# Patient Record
Sex: Female | Born: 1935
Health system: Southern US, Community
[De-identification: ages and names within clinical notes are randomized; demographics above are authoritative.]

## PROBLEM LIST (undated history)

## (undated) DIAGNOSIS — I509 Heart failure, unspecified: Secondary | ICD-10-CM

## (undated) DIAGNOSIS — G8929 Other chronic pain: Secondary | ICD-10-CM

## (undated) DIAGNOSIS — I4891 Unspecified atrial fibrillation: Secondary | ICD-10-CM

## (undated) DIAGNOSIS — M419 Scoliosis, unspecified: Secondary | ICD-10-CM

## (undated) DIAGNOSIS — I10 Essential (primary) hypertension: Secondary | ICD-10-CM

## (undated) HISTORY — PX: OTHER SURGICAL HISTORY: SHX169

## (undated) HISTORY — PX: TONSILLECTOMY: SUR1361

## (undated) HISTORY — PX: ABDOMINAL SURGERY: SHX537

---

## 2019-08-02 DIAGNOSIS — I10 Essential (primary) hypertension: Secondary | ICD-10-CM | POA: Diagnosis present

## 2019-08-02 DIAGNOSIS — E785 Hyperlipidemia, unspecified: Secondary | ICD-10-CM | POA: Insufficient documentation

## 2019-08-02 DIAGNOSIS — G909 Disorder of the autonomic nervous system, unspecified: Secondary | ICD-10-CM | POA: Insufficient documentation

## 2019-08-02 DIAGNOSIS — E559 Vitamin D deficiency, unspecified: Secondary | ICD-10-CM | POA: Insufficient documentation

## 2019-11-12 DIAGNOSIS — C4491 Basal cell carcinoma of skin, unspecified: Secondary | ICD-10-CM

## 2019-11-12 HISTORY — DX: Basal cell carcinoma of skin, unspecified: C44.91

## 2019-12-03 DIAGNOSIS — M4120 Other idiopathic scoliosis, site unspecified: Secondary | ICD-10-CM | POA: Insufficient documentation

## 2019-12-03 DIAGNOSIS — M419 Scoliosis, unspecified: Secondary | ICD-10-CM | POA: Insufficient documentation

## 2020-01-05 DIAGNOSIS — M25559 Pain in unspecified hip: Secondary | ICD-10-CM | POA: Insufficient documentation

## 2020-01-11 ENCOUNTER — Encounter: Payer: Self-pay | Admitting: Pain Medicine

## 2020-01-11 NOTE — Progress Notes (Signed)
Patient: [name] [name]  Service Category: E/M  Provider: Gaspar Cola, MD  DOB: [birth year]  DOS: 01/12/2020  Location: Office  MRN: [id number]  Setting: Ambulatory outpatient  Referring Provider: Perrin Maltese, MD  Type: New Patient  Specialty: Interventional Pain Management  PCP: Perrin Maltese, MD  Location: Remote location  Delivery: TeleHealth     Virtual Encounter - Pain Management PROVIDER NOTE: Information contained herein reflects review and annotations entered in association with encounter. Interpretation of such information and data should be left to medically-trained personnel. Information provided to patient can be located elsewhere in the medical record under "Patient Instructions". Document created using STT-dictation technology, any transcriptional errors that may result from process are unintentional.    Contact & Pharmacy Preferred: [phone] Home: [phone] (home) Mobile: There is no such number on file (mobile). E-mail: No e-mail address on record  [id number] [id number]: [phone]: [phone]  Pre-screening note:  Our staff contacted [name] and offered her an "in person", "face-to-face" appointment versus a telephone encounter. She indicated preferring the telephone encounter, at this time.  Primary Reason(s) for Visit: Tele-Encounter for initial evaluation of one or more chronic problems (new to examiner) potentially causing chronic pain, and posing a threat to normal musculoskeletal function. (Level of risk: High) CC: Back Pain, Hand Pain, Leg Pain, and Hip Pain  I contacted [name] 01/12/2020 via telephone.      I clearly identified myself as FGaspar Cola MD. I verified that I was speaking with the correct person using two identifiers (Name: [name] [name] and date of birth: [birth year].  Advanced Informed Consent I sought verbal advanced consent from  BJohny Shockfor virtual visit interactions. I informed [name] of possible security and privacy concerns, risks, and limitations associated with providing "not-in-person" medical evaluation and management services. I also informed [name] of the availability of "in-person" appointments. Finally, I informed her that there would be a charge for the virtual visit and that she could be  personally, fully or partially, financially responsible for it. Ms. LShermanexpressed understanding and agreed to proceed.   HPI  Ms. LWaldroupis a 85y.o. year old, female patient, contacted today for an initial evaluation of her chronic pain. She has Chronic low back pain (1ry area of Pain) (Bilateral) (R>L) w/ sciatica (Bilateral); Chronic lower extremity pain (2ry area of Pain) (Bilateral) (R>L); Scoliosis (and kyphoscoliosis), idiopathic; DDD (degenerative disc disease), lumbosacral; History of total hip replacement (Bilateral); Walker as ambulation aid; Chronic peripheral neuropathic pain (Bilateral); Chronic pain syndrome; Pharmacologic therapy; Disorder of skeletal system; and Problems influencing health status on their problem list.   Onset and Duration: Gradual Cause of pain: Surgery Severity: No change since onset, NAS-11 at its worse: 10/10, NAS-11 at its best: 10/10, NAS-11 now: 10/10 and NAS-11 on the average: 10/10 Timing: Not influenced by the time of the day Aggravating Factors: Bending, Climbing, Kneeling, Lifiting, Motion, Prolonged sitting, Prolonged standing, Squatting, Stooping , Twisting, Walking, Walking uphill and Walking downhill Alleviating Factors: Acupuncture Associated Problems: Day-time cramps, Night-time cramps, Numbness, Temperature changes, Tingling, Pain that wakes patient up and Pain that does not allow patient to sleep Quality of Pain: Aching, Agonizing, Burning, Constant, Cramping, Cruel, Deep, Disabling, Distressing, Dreadful, Exhausting, Getting longer, Horrible, Nagging,  Pressure-like, Punishing, Sharp, Shooting, Stabbing, Throbbing, Tingling, Tiring, Toothache-like and Uncomfortable Previous Examinations or Tests: MRI scan, Nerve block, X-rays, Neurological evaluation, Orthopedic  evaluation and Chiropractic evaluation Previous Treatments: Epidural steroid injections and Narcotic medications  According to the patient her primary area of pain is that of the lower back, bilaterally, with the right being worse than the left.  She denies any prior back surgeries, or any recent x-rays, MRIs or CT of the lumbar spine.  She denies having had physical therapy for the lower back and she indicates having had a series of 4 lumbar epidural steroid injections done in 2012, in [place], which did seem to help.  The patient's secondary to pain is that of the lower extremities, bilaterally, with the right being worse than the left.  In the case of the right lower extremity the pain is described to be from the knee down to the ankle with no pain, numbness, or weakness above the knee or below the ankle.  This same pattern can be seen also in her left lower extremity pain.  She admits that when she gets the leg pain, she also will experience numbness in the same area.  She denies any surgeries in that area, x-rays or any other diagnostic imaging of the area, physical therapy, or injections into the lower extremities.  The patient also denies any nerve conduction tests.  The patient does have a history positive for bilateral hip replacements with the left one done on 2019 and the right 1 on 2012.  She denies any pain over the hip areas.  She also admits to having scoliosis and a significant lumbar curvature where she indicates being unable to straighten up and also unable to bend down or pick anything up from the floor if it falls down.  The patient indicates using a walker all the time to ambulate.  She also indicates that she has been using this walker for the past 3 years.  The patient  appears to have recently moved to the [place] area since there are no x-rays or lab work available within [facility].  She also does not have any access to "care everywhere" suggesting that she has also not seen anyone within those areas.  A review of the patient's PMP reveals that she has been taking oxycodone/APAP 10/25 since early 2019.  The records also revealed that she has been taking this medication on a regular monthly basis.  Since she is 85 years old, it is very likely that this patient also has a history of taking this type of medication well before 2019 and probably having significant tolerance to the opioids.  After completing my evaluation, I then proceeded to provide the patient with some information with regards to what is it that we do and what is it that we specialize in.  It was at this time that the patient informed us that she is not interested in having any of the interventional therapies that we specialize in.  She also indicated that she was under the impression that she was sent over to [place] so that we would take over her medications and managed them from now on.  I have informed the patient that I do not take any patients for medication management only.  In view of this revelation, I asked the patient if she was interested in me finishing her evaluation by ordering her labs and x-rays and then going over them to tell her what we could offer her in terms of interventional therapies, but she indicated that I would be wasting my time.  Because of this we have decided not to order  any further work-up and we will not be scheduling the patient for a follow-up visit but we have informed the patient that we will keep the doors open, in case she changes her mind and wants to come in for interventional therapies.  Recommendations: At this point, my recommendations would be to order a baseline CRP and sed rate to determine if this patient is having either an acute or an ongoing  inflammatory condition.  Should sed rate or C-reactive protein be elevated, you should consider adding a long-term nonsteroidal anti-inflammatory drug to her regimen.  In addition, I would recommend ordering a complete metabolic panel, magnesium levels, vitamin B12, and vitamin D levels, to evaluate things that could influence her pain perception, such as vitamin D deficiencies or vitamin B12 deficiencies.  Furthermore, I would recommend that she have a bilateral lower extremity nerve conduction test to determine if she in fact has a peripheral neuropathy versus a bilateral lumbosacral radiculopathy/radiculitis.  Because of the patient's age and symptoms, I would also suggest ordering x-rays of the thoracic and lumbar spine with flexion and extension views to determine if the patient has any vertebral body fractures and/or unstable spondylolisthesis.  The patient's history and age would support the possibility that she may be having thoracolumbar facet arthropathy which could be causing some of the patient's low back pain symptoms.  Furthermore, at age 69 and taking into consideration the fact that she admits walking around with significant scoliosis and curvature of her thoracolumbar spine, it is very likely that she has foraminal as well as central spinal stenosis that may also account for the patient's low back and lower extremity pains.  Because of this, she could also benefit from a lumbar MRI.  Since the patient is not interested in any interventional therapies, we have not ordered any of these tests since we are probably not can a follow-up with the patient anyway since she has no intentions of considering the therapies that we have to offer.  Because of this, I have limited my intervention in this case to the initial evaluation and my recommendations.  Regarding the patient's pharmacal therapy with opioids, I would recommend ordering the above x-rays, MRIs, and blood work to determine the extent of the  patient's chronic pain condition.  Should all of the above proved to be within normal limits or simply with minor degenerative changes, consideration should be given to doing a  "Drug Holiday" to eliminate the patient's tolerance to the opioids so that when they are restarted she can get better relief with lower doses.  Historic Controlled Substance Pharmacotherapy Review  Current opioid analgesics: Oxycodone/APAP 10/325, 1 tab PO q 6 hrs (40 mg/day of oxycodone) Highest recorded MME/day: 90 mg/day MME/day: 60 mg/day   Historical Monitoring: The patient  reports previous drug use. List of all UDS Test(s): No results found. List of other Serum/Urine Drug Screening Test(s):  No results found. Historical Background Evaluation: [place] PMP: PDMP reviewed during this encounter. Two (2) year initial data search conducted.             PMP NARX Score Report:  Narcotic: 401 Sedative: 180 Stimulant: 000 Risk Assessment Profile: PMP NARX Overdose Risk Score: 300  Pharmacologic Plan: As per protocol, I have not taken over any controlled substance management, pending the results of ordered tests and/or consults.            Initial impression: Pending review of available data and ordered tests.  Meds   Current Outpatient Medications:  .  aspirin 81 MG chewable tablet, Chew by mouth daily., Disp: , Rfl:  .  Cholecalciferol (VITAMIN D-3) 125 MCG (5000 UT) TABS, Take 1 tablet by mouth daily., Disp: , Rfl:  .  gabapentin (NEURONTIN) 400 MG capsule, Take 400 mg by mouth 4 (four) times daily., Disp: , Rfl:  .  milk thistle 175 MG tablet, Take 175 mg by mouth daily., Disp: , Rfl:  .  Multiple Vitamins-Minerals (ICAPS AREDS 2 PO), Take 1 tablet by mouth daily., Disp: , Rfl:  .  NIFEdipine (ADALAT CC) 60 MG 24 hr tablet, Take 60 mg by mouth daily., Disp: , Rfl:  .  oxyCODONE (OXY IR/ROXICODONE) 5 MG immediate release tablet, Take 5 mg by mouth 4 (four) times daily., Disp: , Rfl:  .  Turmeric (QC TUMERIC  COMPLEX PO), Take 500 mg by mouth daily., Disp: , Rfl:   ROS  Cardiovascular: Daily Aspirin intake and High blood pressure Pulmonary or Respiratory: No reported pulmonary signs or symptoms such as wheezing and difficulty taking a deep full breath (Asthma), difficulty blowing air out (Emphysema), coughing up mucus (Bronchitis), persistent dry cough, or temporary stoppage of breathing during sleep Neurological: Abnormal skin sensations (Peripheral Neuropathy), TIA at 69 y0 Psychological-Psychiatric: No reported psychological or psychiatric signs or symptoms such as difficulty sleeping, anxiety, depression, delusions or hallucinations (schizophrenial), mood swings (bipolar disorders) or suicidal ideations or attempts Gastrointestinal: No reported gastrointestinal signs or symptoms such as vomiting or evacuating blood, reflux, heartburn, alternating episodes of diarrhea and constipation, inflamed or scarred liver, or pancreas or irrregular and/or infrequent bowel movements Genitourinary: No reported renal or genitourinary signs or symptoms such as difficulty voiding or producing urine, peeing blood, non-functioning kidney, kidney stones, difficulty emptying the bladder, difficulty controlling the flow of urine, or chronic kidney disease Hematological: Brusing easily Endocrine: No reported endocrine signs or symptoms such as high or low blood sugar, rapid heart rate due to high thyroid levels, obesity or weight gain due to slow thyroid or thyroid disease Rheumatologic: No reported rheumatological signs and symptoms such as fatigue, joint pain, tenderness, swelling, redness, heat, stiffness, decreased range of motion, with or without associated rash Musculoskeletal: Negative for myasthenia gravis, muscular dystrophy, multiple sclerosis or malignant hyperthermia Work History: Disabled  Allergies  Ms. Jarchow is allergic to sulfa antibiotics.  Laboratory Chemistry Profile   Renal No results found for:  BUN, CREATININE, LABCREA, BCR, GFR, GFRAA, GFRNONAA, SPECGRAV, PHUR, PROTEINUR  Electrolytes No results found for: NA, K, CL, CALCIUM, MG, PHOS  Hepatic No results found for: AST, ALT, ALBUMIN, ALKPHOS, AMYLASE, LIPASE, AMMONIA  ID No results found for: LYMEIGGIGMAB, HIV, SARSCOV2NAA, STAPHAUREUS, MRSAPCR, HCVAB, PREGTESTUR, RMSFIGG, QFVRPH1IGG, QFVRPH2IGG, LYMEIGGIGMAB  Bone No results found for: VD25OH, EX517GY1VCB, SW9675FF6, BW4665LD3, 25OHVITD1, 25OHVITD2, 25OHVITD3, TESTOFREE, TESTOSTERONE  Endocrine No results found for: GLUCOSE, GLUCOSEU, HGBA1C, TSH, FREET4, TESTOFREE, TESTOSTERONE, SHBG, ESTRADIOL, ESTRADIOLPCT, ESTRADIOLFRE, LABPREG, ACTH, CRTSLPL, UCORFRPERLTR, UCORFRPERDAY, CORTISOLBASE, LABPREG  Neuropathy No results found for: VITAMINB12, FOLATE, HGBA1C, HIV  CNS No results found for: COLORCSF, APPEARCSF, RBCCOUNTCSF, WBCCSF, POLYSCSF, LYMPHSCSF, EOSCSF, PROTEINCSF, GLUCCSF, JCVIRUS, CSFOLI, IGGCSF, LABACHR, ACETBL, LABACHR, ACETBL  Inflammation (CRP: Acute  ESR: Chronic) No results found for: CRP, ESRSEDRATE, LATICACIDVEN  Rheumatology No results found for: RF, ANA, LABURIC, URICUR, LYMEIGGIGMAB, LYMEABIGMQN, HLAB27  Coagulation No results found for: INR, LABPROT, APTT, PLT, DDIMER, LABHEMA, VITAMINK1, AT3  Cardiovascular No results found for: BNP, CKTOTAL, CKMB, TROPONINI, HGB, HCT, LABVMA, EPIRU, EPINEPH24HUR, NOREPRU, NOREPI24HUR, DOPARU, DOPAM24HRUR  Screening No results found for: SARSCOV2NAA, COVIDSOURCE, STAPHAUREUS, MRSAPCR, HCVAB, HIV,  PREGTESTUR  Cancer No results found for: CEA, CA125, LABCA2  Allergens No results found for: ALMOND, APPLE, ASPARAGUS, AVOCADO, BANANA, BARLEY, BASIL, BAYLEAF, GREENBEAN, LIMABEAN, WHITEBEAN, BEEFIGE, REDBEET, BLUEBERRY, BROCCOLI, CABBAGE, MELON, CARROT, CASEIN, CASHEWNUT, CAULIFLOWER, CELERY    Note: No results found under the [employer] electronic medical record  Imaging Review   Complexity Note: No results found under the  [employer] electronic medical record.                        Circle  Drug: Ms. Jokerst  reports previous drug use. Alcohol:  reports previous alcohol use. Tobacco:  reports that she has been smoking cigarettes. She has been smoking about 5.00 packs per day. She has never used smokeless tobacco. Medical:  has no past medical history on file. Family: family history is not on file.  Active Ambulatory Problems    Diagnosis Date Noted  . Chronic low back pain (1ry area of Pain) (Bilateral) (R>L) w/ sciatica (Bilateral) 01/12/2020  . Chronic lower extremity pain (2ry area of Pain) (Bilateral) (R>L) 01/12/2020  . Scoliosis (and kyphoscoliosis), idiopathic 01/12/2020  . DDD (degenerative disc disease), lumbosacral 01/12/2020  . History of total hip replacement (Bilateral) 01/12/2020  . Walker as ambulation aid 01/12/2020  . Chronic peripheral neuropathic pain (Bilateral) 01/12/2020  . Chronic pain syndrome 01/12/2020  . Pharmacologic therapy 01/12/2020  . Disorder of skeletal system 01/12/2020  . Problems influencing health status 01/12/2020   Resolved Ambulatory Problems    Diagnosis Date Noted  . No Resolved Ambulatory Problems   No Additional Past Medical History   Assessment  Primary Diagnosis & Pertinent Problem List: Diagnoses of Chronic low back pain (1ry area of Pain) (Bilateral) (R>L) w/ sciatica (Bilateral), Chronic lower extremity pain (2ry area of Pain) (Bilateral) (R>L), Scoliosis (and kyphoscoliosis), idiopathic, DDD (degenerative disc disease), lumbosacral, History of total hip replacement (Bilateral), Walker as ambulation aid, Chronic peripheral neuropathic pain (Bilateral), Chronic pain syndrome, Pharmacologic therapy, Disorder of skeletal system, and Problems influencing health status were pertinent to this visit.  Visit Diagnosis (New problems to examiner): 1. Chronic low back pain (1ry area of Pain) (Bilateral) (R>L) w/ sciatica (Bilateral)   2. Chronic lower extremity  pain (2ry area of Pain) (Bilateral) (R>L)   3. Scoliosis (and kyphoscoliosis), idiopathic   4. DDD (degenerative disc disease), lumbosacral   5. History of total hip replacement (Bilateral)   6. Walker as ambulation aid   7. Chronic peripheral neuropathic pain (Bilateral)   8. Chronic pain syndrome   9. Pharmacologic therapy   10. Disorder of skeletal system   11. Problems influencing health status    Plan of Care (Initial workup plan)  Note: Ms. Perlow was reminded that as per protocol, today's visit has been an evaluation only. We have not taken over the patient's controlled substance management.  Problem-specific plan: No problem-specific Assessment & Plan notes found for this encounter.  Lab Orders  No laboratory test(s) ordered today   Imaging Orders  No imaging studies ordered today   Referral Orders  No referral(s) requested today   Procedure Orders    No procedure(s) ordered today   Pharmacotherapy (current): Medications ordered:  No orders of the defined types were placed in this encounter.    Pharmacological management options:  Opioid Analgesics: The patient was informed that there is no guarantee that she would be a candidate for opioid analgesics. The decision will be made following CDC guidelines. This decision will be based on  the results of diagnostic studies, as well as Ms. Bann's risk profile.   Membrane stabilizer: To be determined at a later time  Muscle relaxant: To be determined at a later time  NSAID: To be determined at a later time  Other analgesic(s): To be determined at a later time   Interventional management options: Ms. Vitali was informed that there is no guarantee that she would be a candidate for interventional therapies. The decision will be based on the results of diagnostic studies, as well as Ms. Godina's risk profile.  Procedure(s) under consideration:  Today the patient has indicated that she is not interested in any of the  interventional therapies that we have to offer.  She also indicated that perhaps she was sent to the wrong place since she was under the impression that we were simply going to take over her medication management and continue prescribing medicines for her.  Since that is not what we do and we do not take patients for medication management only, I have informed the patient that we will remain available to her should she be interested in the interventional therapies that we have to offer.  She indicated being quite sure that she wanted to avoid that.   Provider-requested follow-up: No follow-ups on file.  Future Appointments  Date Time Provider [place]  01/14/2020 10:00 AM [facility]   Total duration of encounter: 35 minutes.  Primary Care Physician: Perrin Maltese, MD Note by: Gaspar Cola, MD Date: 01/12/2020; Time: 3:44 PM

## 2020-01-12 ENCOUNTER — Other Ambulatory Visit: Payer: Self-pay

## 2020-01-12 ENCOUNTER — Ambulatory Visit: Payer: Medicare Other | Attending: Pain Medicine | Admitting: Pain Medicine

## 2020-01-12 DIAGNOSIS — G894 Chronic pain syndrome: Secondary | ICD-10-CM | POA: Insufficient documentation

## 2020-01-12 DIAGNOSIS — M79605 Pain in left leg: Secondary | ICD-10-CM

## 2020-01-12 DIAGNOSIS — M792 Neuralgia and neuritis, unspecified: Secondary | ICD-10-CM

## 2020-01-12 DIAGNOSIS — M5442 Lumbago with sciatica, left side: Secondary | ICD-10-CM

## 2020-01-12 DIAGNOSIS — Z789 Other specified health status: Secondary | ICD-10-CM

## 2020-01-12 DIAGNOSIS — M79604 Pain in right leg: Secondary | ICD-10-CM | POA: Diagnosis not present

## 2020-01-12 DIAGNOSIS — Z79899 Other long term (current) drug therapy: Secondary | ICD-10-CM

## 2020-01-12 DIAGNOSIS — Z9989 Dependence on other enabling machines and devices: Secondary | ICD-10-CM

## 2020-01-12 DIAGNOSIS — M899 Disorder of bone, unspecified: Secondary | ICD-10-CM

## 2020-01-12 DIAGNOSIS — G8929 Other chronic pain: Secondary | ICD-10-CM

## 2020-01-12 DIAGNOSIS — M4120 Other idiopathic scoliosis, site unspecified: Secondary | ICD-10-CM | POA: Diagnosis not present

## 2020-01-12 DIAGNOSIS — M5441 Lumbago with sciatica, right side: Secondary | ICD-10-CM

## 2020-01-12 DIAGNOSIS — Z96643 Presence of artificial hip joint, bilateral: Secondary | ICD-10-CM | POA: Insufficient documentation

## 2020-01-12 DIAGNOSIS — M5137 Other intervertebral disc degeneration, lumbosacral region: Secondary | ICD-10-CM

## 2020-01-12 HISTORY — DX: Presence of artificial hip joint, bilateral: Z96.643

## 2020-01-12 HISTORY — DX: Other long term (current) drug therapy: Z79.899

## 2020-01-12 HISTORY — DX: Other specified health status: Z78.9

## 2020-01-14 ENCOUNTER — Other Ambulatory Visit: Payer: Self-pay

## 2020-01-14 ENCOUNTER — Ambulatory Visit: Payer: Medicare Other | Attending: Internal Medicine

## 2020-01-14 DIAGNOSIS — Z23 Encounter for immunization: Secondary | ICD-10-CM | POA: Insufficient documentation

## 2020-01-14 NOTE — Progress Notes (Signed)
   Covid-19 Vaccination Clinic  Name:  [name] [name]    MRN: [id number] DOB: 12-Jun-1935  01/14/2020  [name] was observed post Covid-19 immunization for 15 minutes without incident. She was provided with Vaccine Information Sheet and instruction to access the V-Safe system.   [name] was instructed to call 911 with any severe reactions post vaccine: Marland Kitchen Difficulty breathing  . Swelling of face and throat  . A fast heartbeat  . A bad rash all over body  . Dizziness and weakness   Immunizations Administered    Name Date Dose VIS Date Route   Pfizer COVID-19 Vaccine 01/14/2020  9:41 AM 0.3 mL 10/22/2019 Intramuscular   Manufacturer: [place]   Lot: [id number]   [place]: [id number]

## 2020-02-03 ENCOUNTER — Ambulatory Visit: Payer: Medicare Other | Attending: Internal Medicine

## 2020-02-03 DIAGNOSIS — Z23 Encounter for immunization: Secondary | ICD-10-CM

## 2020-05-29 ENCOUNTER — Ambulatory Visit: Payer: Medicare Other | Admitting: Dermatology

## 2020-05-29 ENCOUNTER — Other Ambulatory Visit: Payer: Self-pay

## 2020-05-29 DIAGNOSIS — L578 Other skin changes due to chronic exposure to nonionizing radiation: Secondary | ICD-10-CM

## 2020-05-29 DIAGNOSIS — D229 Melanocytic nevi, unspecified: Secondary | ICD-10-CM

## 2020-05-29 DIAGNOSIS — D485 Neoplasm of uncertain behavior of skin: Secondary | ICD-10-CM

## 2020-05-29 DIAGNOSIS — C44319 Basal cell carcinoma of skin of other parts of face: Secondary | ICD-10-CM | POA: Diagnosis not present

## 2020-05-29 NOTE — Progress Notes (Signed)
   New Patient Visit  Subjective  [name] [name] is a 85 y.o. female who presents for the following: lesion.  Patient here today for a lesion at the left face. Was removed 11/16/2017 in [place] by Dr. Hulda Humphrey with Moh's and had clear margins per patient. She has been in [place] for about 10 months and within a few weeks she noticed something coming back that started the size of a pinhead. Patient does not remember what type of cancer it was. It has really grown over the past 9 months.  The following portions of the chart were reviewed this encounter and updated as appropriate:      Review of Systems:  No other skin or systemic complaints except as noted in HPI or Assessment and Plan.  Objective  Well appearing patient in no apparent distress; mood and affect are within normal limits.  A focused examination was performed including face. Relevant physical exam findings are noted in the Assessment and Plan.  Objective  Left Temple: 3.0 x 2.5cm pink pearly plaque with crusting with adjacent scar      Assessment & Plan  Neoplasm of uncertain behavior of skin Left Temple  Skin / nail biopsy Type of biopsy: tangential   Informed consent: discussed and consent obtained   Patient was prepped and draped in usual sterile fashion: Area prepped with alcohol. Anesthesia: the lesion was anesthetized in a standard fashion   Anesthetic:  1% lidocaine w/ epinephrine 1-100,000 buffered w/ 8.4% NaHCO3 Instrument used: flexible razor blade   Hemostasis achieved with: pressure, aluminum chloride and electrodesiccation   Outcome: patient tolerated procedure well   Post-procedure details: wound care instructions given   Post-procedure details comment:  Ointment and small bandage applied  Specimen 1 - Surgical pathology Differential Diagnosis: r/o recurrent BCC Check Margins: No 3.0 x 2.5cm pink pearly plaque with crusting with adjacent scar  Will plan Moh's if + with Dr. Lacinda Axon  Melanocytic Nevi -  Tan-brown and/or pink-flesh-colored symmetric macules and papules neck - Benign appearing on exam today - Observation - Call clinic for new or changing moles - Recommend daily use of broad spectrum spf 30+ sunscreen to sun-exposed areas.   Actinic Damage - diffuse scaly erythematous macules with underlying dyspigmentation - Recommend daily broad spectrum sunscreen SPF 30+ to sun-exposed areas, reapply every 2 hours as needed.  - Call for new or changing lesions.  Return if symptoms worsen or fail to improve, for pending biopsy results.  Graciella Belton, RMA, am acting as scribe for Brendolyn Patty, MD . Documentation: I have reviewed the above documentation for accuracy and completeness, and I agree with the above.  Brendolyn Patty MD

## 2020-05-29 NOTE — Patient Instructions (Signed)

## 2020-05-30 ENCOUNTER — Telehealth: Payer: Self-pay

## 2020-05-30 ENCOUNTER — Other Ambulatory Visit: Payer: Self-pay

## 2020-05-30 DIAGNOSIS — C44310 Basal cell carcinoma of skin of unspecified parts of face: Secondary | ICD-10-CM

## 2020-05-30 NOTE — Telephone Encounter (Signed)
Advised patient biopsy on left temple was BCC. We will schedule her for [facility] surgery with Dr Lacinda Axon due to size and recurrent.

## 2020-06-11 ENCOUNTER — Inpatient Hospital Stay
Admission: EM | Admit: 2020-06-11 | Discharge: 2020-06-14 | DRG: 246 | Disposition: A | Discharge status: Home / Self Care | Payer: Medicare Other | Attending: Internal Medicine | Admitting: Internal Medicine

## 2020-06-11 ENCOUNTER — Other Ambulatory Visit: Payer: Self-pay

## 2020-06-11 ENCOUNTER — Emergency Department: Payer: Medicare Other

## 2020-06-11 ENCOUNTER — Encounter: Payer: Self-pay | Admitting: Emergency Medicine

## 2020-06-11 DIAGNOSIS — M545 Low back pain: Secondary | ICD-10-CM | POA: Diagnosis present

## 2020-06-11 DIAGNOSIS — R0602 Shortness of breath: Secondary | ICD-10-CM

## 2020-06-11 DIAGNOSIS — E43 Unspecified severe protein-calorie malnutrition: Secondary | ICD-10-CM | POA: Insufficient documentation

## 2020-06-11 DIAGNOSIS — Z96643 Presence of artificial hip joint, bilateral: Secondary | ICD-10-CM | POA: Diagnosis present

## 2020-06-11 DIAGNOSIS — I255 Ischemic cardiomyopathy: Secondary | ICD-10-CM | POA: Diagnosis not present

## 2020-06-11 DIAGNOSIS — Z681 Body mass index (BMI) 19 or less, adult: Secondary | ICD-10-CM

## 2020-06-11 DIAGNOSIS — Z20822 Contact with and (suspected) exposure to covid-19: Secondary | ICD-10-CM | POA: Diagnosis present

## 2020-06-11 DIAGNOSIS — I25118 Atherosclerotic heart disease of native coronary artery with other forms of angina pectoris: Secondary | ICD-10-CM | POA: Diagnosis present

## 2020-06-11 DIAGNOSIS — I10 Essential (primary) hypertension: Secondary | ICD-10-CM | POA: Diagnosis present

## 2020-06-11 DIAGNOSIS — I214 Non-ST elevation (NSTEMI) myocardial infarction: Secondary | ICD-10-CM

## 2020-06-11 DIAGNOSIS — I509 Heart failure, unspecified: Secondary | ICD-10-CM | POA: Diagnosis not present

## 2020-06-11 DIAGNOSIS — M79602 Pain in left arm: Secondary | ICD-10-CM

## 2020-06-11 DIAGNOSIS — Z79899 Other long term (current) drug therapy: Secondary | ICD-10-CM | POA: Diagnosis not present

## 2020-06-11 DIAGNOSIS — Z7982 Long term (current) use of aspirin: Secondary | ICD-10-CM

## 2020-06-11 DIAGNOSIS — Z720 Tobacco use: Secondary | ICD-10-CM | POA: Diagnosis not present

## 2020-06-11 DIAGNOSIS — E871 Hypo-osmolality and hyponatremia: Secondary | ICD-10-CM | POA: Diagnosis present

## 2020-06-11 DIAGNOSIS — I482 Chronic atrial fibrillation, unspecified: Secondary | ICD-10-CM

## 2020-06-11 DIAGNOSIS — Z882 Allergy status to sulfonamides status: Secondary | ICD-10-CM | POA: Diagnosis not present

## 2020-06-11 DIAGNOSIS — M419 Scoliosis, unspecified: Secondary | ICD-10-CM | POA: Diagnosis present

## 2020-06-11 DIAGNOSIS — M549 Dorsalgia, unspecified: Secondary | ICD-10-CM | POA: Diagnosis present

## 2020-06-11 DIAGNOSIS — G8929 Other chronic pain: Secondary | ICD-10-CM | POA: Diagnosis present

## 2020-06-11 DIAGNOSIS — F419 Anxiety disorder, unspecified: Secondary | ICD-10-CM | POA: Diagnosis present

## 2020-06-11 DIAGNOSIS — D509 Iron deficiency anemia, unspecified: Secondary | ICD-10-CM | POA: Diagnosis present

## 2020-06-11 DIAGNOSIS — I110 Hypertensive heart disease with heart failure: Secondary | ICD-10-CM | POA: Diagnosis present

## 2020-06-11 DIAGNOSIS — M51379 Other intervertebral disc degeneration, lumbosacral region without mention of lumbar back pain or lower extremity pain: Secondary | ICD-10-CM | POA: Diagnosis present

## 2020-06-11 DIAGNOSIS — D649 Anemia, unspecified: Secondary | ICD-10-CM | POA: Diagnosis not present

## 2020-06-11 DIAGNOSIS — R778 Other specified abnormalities of plasma proteins: Secondary | ICD-10-CM

## 2020-06-11 DIAGNOSIS — Z8249 Family history of ischemic heart disease and other diseases of the circulatory system: Secondary | ICD-10-CM | POA: Diagnosis not present

## 2020-06-11 DIAGNOSIS — F17210 Nicotine dependence, cigarettes, uncomplicated: Secondary | ICD-10-CM | POA: Diagnosis present

## 2020-06-11 DIAGNOSIS — I2510 Atherosclerotic heart disease of native coronary artery without angina pectoris: Secondary | ICD-10-CM | POA: Diagnosis not present

## 2020-06-11 DIAGNOSIS — I5021 Acute systolic (congestive) heart failure: Secondary | ICD-10-CM | POA: Diagnosis not present

## 2020-06-11 DIAGNOSIS — I4891 Unspecified atrial fibrillation: Secondary | ICD-10-CM | POA: Diagnosis present

## 2020-06-11 DIAGNOSIS — G894 Chronic pain syndrome: Secondary | ICD-10-CM | POA: Diagnosis present

## 2020-06-11 DIAGNOSIS — Z85828 Personal history of other malignant neoplasm of skin: Secondary | ICD-10-CM | POA: Diagnosis not present

## 2020-06-11 DIAGNOSIS — I5031 Acute diastolic (congestive) heart failure: Secondary | ICD-10-CM | POA: Diagnosis not present

## 2020-06-11 DIAGNOSIS — M5137 Other intervertebral disc degeneration, lumbosacral region: Secondary | ICD-10-CM | POA: Diagnosis present

## 2020-06-11 DIAGNOSIS — I471 Supraventricular tachycardia: Secondary | ICD-10-CM | POA: Diagnosis present

## 2020-06-11 HISTORY — DX: Non-ST elevation (NSTEMI) myocardial infarction: I21.4

## 2020-06-11 HISTORY — DX: Scoliosis, unspecified: M41.9

## 2020-06-11 HISTORY — DX: Heart failure, unspecified: I50.9

## 2020-06-11 HISTORY — DX: Essential (primary) hypertension: I10

## 2020-06-11 HISTORY — DX: Unspecified atrial fibrillation: I48.91

## 2020-06-11 HISTORY — DX: Hypo-osmolality and hyponatremia: E87.1

## 2020-06-11 HISTORY — DX: Other chronic pain: G89.29

## 2020-06-11 LAB — CBC WITH DIFFERENTIAL/PLATELET
Abs Immature Granulocytes: 0.04 10*3/uL (ref 0.00–0.07)
Basophils Absolute: 0 10*3/uL (ref 0.0–0.1)
Basophils Relative: 0 %
Eosinophils Absolute: 0 10*3/uL (ref 0.0–0.5)
Eosinophils Relative: 0 %
HCT: 30 % — ABNORMAL LOW (ref 36.0–46.0)
Hemoglobin: 9.5 g/dL — ABNORMAL LOW (ref 12.0–15.0)
Immature Granulocytes: 0 %
Lymphocytes Relative: 6 %
Lymphs Abs: 0.7 10*3/uL (ref 0.7–4.0)
MCH: 28.7 pg (ref 26.0–34.0)
MCHC: 31.7 g/dL (ref 30.0–36.0)
MCV: 90.6 fL (ref 80.0–100.0)
Monocytes Absolute: 0.9 10*3/uL (ref 0.1–1.0)
Monocytes Relative: 9 %
Neutro Abs: 8.6 10*3/uL — ABNORMAL HIGH (ref 1.7–7.7)
Neutrophils Relative %: 85 %
Platelets: 318 10*3/uL (ref 150–400)
RBC: 3.31 MIL/uL — ABNORMAL LOW (ref 3.87–5.11)
RDW: 12.4 % (ref 11.5–15.5)
WBC: 10.3 10*3/uL (ref 4.0–10.5)
nRBC: 0 % (ref 0.0–0.2)

## 2020-06-11 LAB — COMPREHENSIVE METABOLIC PANEL
ALT: 20 U/L (ref 0–44)
AST: 40 U/L (ref 15–41)
Albumin: 3.8 g/dL (ref 3.5–5.0)
Alkaline Phosphatase: 120 U/L (ref 38–126)
Anion gap: 12 (ref 5–15)
BUN: 8 mg/dL (ref 8–23)
CO2: 23 mmol/L (ref 22–32)
Calcium: 8.5 mg/dL — ABNORMAL LOW (ref 8.9–10.3)
Chloride: 95 mmol/L — ABNORMAL LOW (ref 98–111)
Creatinine, Ser: 0.81 mg/dL (ref 0.44–1.00)
GFR calc Af Amer: >60 mL/min (ref >60)
GFR calc non Af Amer: >60 mL/min (ref >60)
Glucose, Bld: 124 mg/dL — ABNORMAL HIGH (ref 70–99)
Potassium: 4 mmol/L (ref 3.5–5.1)
Sodium: 130 mmol/L — ABNORMAL LOW (ref 135–145)
Total Bilirubin: 0.9 mg/dL (ref 0.3–1.2)
Total Protein: 6.5 g/dL (ref 6.5–8.1)

## 2020-06-11 LAB — TROPONIN I (HIGH SENSITIVITY)
Troponin I (High Sensitivity): 2853 ng/L (ref <18)
Troponin I (High Sensitivity): 2600 ng/L (ref <18)
Troponin I (High Sensitivity): 2289 ng/L (ref <18)
Troponin I (High Sensitivity): 2383 ng/L (ref <18)

## 2020-06-11 LAB — PROTIME-INR
INR: 1.1 (ref 0.8–1.2)
Prothrombin Time: 14.1 seconds (ref 11.4–15.2)

## 2020-06-11 LAB — BRAIN NATRIURETIC PEPTIDE: B Natriuretic Peptide: 1621.3 pg/mL — ABNORMAL HIGH (ref 0.0–100.0)

## 2020-06-11 LAB — URINE DRUG SCREEN, QUALITATIVE (ARMC ONLY)
Amphetamines, Ur Screen: NOT DETECTED
Barbiturates, Ur Screen: NOT DETECTED
Benzodiazepine, Ur Scrn: NOT DETECTED
Cannabinoid 50 Ng, Ur ~~LOC~~: NOT DETECTED
Cocaine Metabolite,Ur ~~LOC~~: NOT DETECTED
MDMA (Ecstasy)Ur Screen: NOT DETECTED
Methadone Scn, Ur: NOT DETECTED
Opiate, Ur Screen: NOT DETECTED
Phencyclidine (PCP) Ur S: NOT DETECTED
Tricyclic, Ur Screen: NOT DETECTED

## 2020-06-11 LAB — APTT: aPTT: >160 seconds (ref 24–36)

## 2020-06-11 LAB — SARS CORONAVIRUS 2 BY RT PCR (HOSPITAL ORDER, PERFORMED IN ~~LOC~~ HOSPITAL LAB): SARS Coronavirus 2: NEGATIVE

## 2020-06-11 MED ORDER — NIFEDIPINE ER 60 MG PO TB24: 60.0000 mg | ORAL_TABLET | Freq: Every day | ORAL | Status: DC

## 2020-06-11 MED ORDER — METOPROLOL TARTRATE 25 MG PO TABS
12.5000 mg | ORAL_TABLET | Freq: Two times a day (BID) | ORAL | Status: DC
  Administered 2020-06-11 – 2020-06-12 (×2): 12.5 mg via ORAL
  Filled 2020-06-11 (×2): qty 1

## 2020-06-11 MED ORDER — ASPIRIN 81 MG PO CHEW: 324.0000 mg | CHEWABLE_TABLET | Freq: Once | ORAL | Status: AC

## 2020-06-11 MED ORDER — SODIUM CHLORIDE 0.9% FLUSH
3.0000 mL | Freq: Two times a day (BID) | INTRAVENOUS | Status: DC
Start: 2020-06-11 — End: 2020-06-11

## 2020-06-11 MED ORDER — ONDANSETRON HCL 4 MG/2ML IJ SOLN: 4.0000 mg | Freq: Three times a day (TID) | INTRAMUSCULAR | Status: DC | PRN

## 2020-06-11 MED ORDER — SODIUM CHLORIDE 0.9% FLUSH
3.0000 mL | Freq: Two times a day (BID) | INTRAVENOUS | Status: DC
  Administered 2020-06-11 – 2020-06-13 (×4): 3 mL via INTRAVENOUS

## 2020-06-11 MED ORDER — IPRATROPIUM-ALBUTEROL 0.5-2.5 (3) MG/3ML IN SOLN
3.0000 mL | Freq: Four times a day (QID) | RESPIRATORY_TRACT | Status: DC
  Administered 2020-06-11 – 2020-06-12 (×4): 3 mL via RESPIRATORY_TRACT
  Filled 2020-06-11 (×5): qty 3

## 2020-06-11 MED ORDER — ALBUTEROL SULFATE (2.5 MG/3ML) 0.083% IN NEBU: 2.5000 mg | INHALATION_SOLUTION | RESPIRATORY_TRACT | Status: DC | PRN

## 2020-06-11 MED ORDER — VITAMIN D 25 MCG (1000 UNIT) PO TABS
5000.0000 [IU] | ORAL_TABLET | Freq: Every day | ORAL | Status: DC
  Administered 2020-06-12 – 2020-06-14 (×3): 5000 [IU] via ORAL
  Filled 2020-06-11 (×3): qty 5

## 2020-06-11 MED ORDER — GABAPENTIN 400 MG PO CAPS
400.0000 mg | ORAL_CAPSULE | Freq: Four times a day (QID) | ORAL | Status: DC
  Administered 2020-06-11 – 2020-06-14 (×9): 400 mg via ORAL
  Filled 2020-06-11 (×12): qty 1

## 2020-06-11 MED ORDER — OXYCODONE HCL 5 MG PO TABS
5.0000 mg | ORAL_TABLET | Freq: Three times a day (TID) | ORAL | Status: DC | PRN
  Administered 2020-06-11 – 2020-06-14 (×7): 5 mg via ORAL
  Filled 2020-06-11 (×6): qty 1

## 2020-06-11 MED ORDER — HEPARIN (PORCINE) 25000 UT/250ML-% IV SOLN
900.0000 [IU]/h | INTRAVENOUS | Status: DC
  Administered 2020-06-11: 600 [IU]/h via INTRAVENOUS

## 2020-06-11 MED ORDER — SODIUM CHLORIDE 0.9% FLUSH: 3.0000 mL | INTRAVENOUS | Status: DC | PRN

## 2020-06-11 MED ORDER — NITROGLYCERIN 0.4 MG SL SUBL: 0.4000 mg | SUBLINGUAL_TABLET | SUBLINGUAL | Status: DC | PRN

## 2020-06-11 MED ORDER — HEPARIN SODIUM (PORCINE) 5000 UNIT/ML IJ SOLN
60.0000 [IU]/kg | Freq: Once | INTRAMUSCULAR | Status: AC
  Administered 2020-06-11: 3050 [IU] via INTRAVENOUS
  Filled 2020-06-11: qty 1

## 2020-06-11 MED ORDER — DM-GUAIFENESIN ER 30-600 MG PO TB12: 1.0000 | ORAL_TABLET | Freq: Two times a day (BID) | ORAL | Status: DC | PRN

## 2020-06-11 MED ORDER — OXYCODONE ER 13.5 MG PO C12A: 13.5000 mg | EXTENDED_RELEASE_CAPSULE | Freq: Two times a day (BID) | ORAL | Status: DC

## 2020-06-11 MED ORDER — HYDRALAZINE HCL 20 MG/ML IJ SOLN: 5.0000 mg | INTRAMUSCULAR | Status: DC | PRN

## 2020-06-11 MED ORDER — ASPIRIN 81 MG PO CHEW
CHEWABLE_TABLET | ORAL | Status: AC
  Administered 2020-06-11: 324 mg via ORAL
  Filled 2020-06-11: qty 4

## 2020-06-11 MED ORDER — FUROSEMIDE 20 MG PO TABS
20.0000 mg | ORAL_TABLET | Freq: Once | ORAL | Status: AC
  Administered 2020-06-11: 20 mg via ORAL
  Filled 2020-06-11: qty 1

## 2020-06-11 MED ORDER — HEPARIN SODIUM (PORCINE) 5000 UNIT/ML IJ SOLN
60.0000 [IU]/kg | Freq: Once | INTRAMUSCULAR | Status: AC
  Administered 2020-06-11: 3050 [IU] via INTRAVENOUS

## 2020-06-11 MED ORDER — SODIUM CHLORIDE 0.9 % IV SOLN: 250.0000 mL | INTRAVENOUS | Status: DC | PRN

## 2020-06-11 MED ORDER — HEPARIN (PORCINE) 25000 UT/250ML-% IV SOLN
600.0000 [IU]/h | INTRAVENOUS | Status: DC
  Administered 2020-06-11: 600 [IU]/h via INTRAVENOUS
  Filled 2020-06-11: qty 250

## 2020-06-11 MED ORDER — ACETAMINOPHEN 325 MG PO TABS: 650.0000 mg | ORAL_TABLET | Freq: Four times a day (QID) | ORAL | Status: DC | PRN

## 2020-06-11 MED ORDER — ASPIRIN EC 81 MG PO TBEC
81.0000 mg | DELAYED_RELEASE_TABLET | Freq: Every day | ORAL | Status: DC
  Administered 2020-06-12 – 2020-06-14 (×3): 81 mg via ORAL
  Filled 2020-06-11 (×4): qty 1

## 2020-06-11 MED ORDER — ATORVASTATIN CALCIUM 20 MG PO TABS
40.0000 mg | ORAL_TABLET | Freq: Every day | ORAL | Status: DC
  Administered 2020-06-11 – 2020-06-14 (×4): 40 mg via ORAL
  Filled 2020-06-11 (×4): qty 2

## 2020-06-11 MED ORDER — NICOTINE 21 MG/24HR TD PT24
21.0000 mg | MEDICATED_PATCH | Freq: Every day | TRANSDERMAL | Status: DC
  Administered 2020-06-12 – 2020-06-14 (×3): 21 mg via TRANSDERMAL
  Filled 2020-06-11 (×3): qty 1

## 2020-06-11 MED ORDER — OXYCODONE HCL 5 MG PO TABS: 5.0000 mg | ORAL_TABLET | Freq: Two times a day (BID) | ORAL | Status: DC | PRN

## 2020-06-11 NOTE — ED Notes (Signed)
Pt placed on 4L [place] d/t low O2 levels

## 2020-06-11 NOTE — Progress Notes (Signed)
Lab called ariel RN from [facility] about patient's elevated PT on way up to room. RN ariel stopped Hep gtt at time. MD Blaine Hamper notified By this RN on arrival to unit.

## 2020-06-11 NOTE — ED Triage Notes (Signed)
Pt to ED via POV for shortness of breath that started around 11 am yesterday. Pt states that she does not have a hx/o COPD. Pt reports that she was recently started on a new medication, Pt started taking oxycodone on 7/9. Pt thinks that the shortness of breath is related to the oxycodone. Pt states that she feels like her throat is closing up.

## 2020-06-11 NOTE — H&P (View-Only) (Signed)
Cardiology Consultation:   Patient ID: [name] [name] MRN: [id number]; DOB: 03-24-1935  Admit date: 06/11/2020 Date of Consult: 06/11/2020  Primary Care Provider: Perrin Maltese, MD [facility] Cardiologist: New   Patient Profile:   [name] [name] is a 85 y.o. female with a hx of basal cell carcinoma who is being seen today for the evaluation of NSTEMI at the request of Vladimir Crofts MD.  History of Present Illness:   No prior cardiac history.  She typically complains of dyspnea only with more vigorous activities.  There is no history of orthopnea, PND, chest pain or syncope.  Yesterday after climbing stairs with groceries she developed sudden onset of dyspnea and an aching sensation in her left upper extremity.  Her pain lasted approximately 10 minutes and resolved.  No nausea or diaphoresis.  Her dyspnea improved but persisted.  When she awoke this morning she continued to have dyspnea and presented to the emergency room for evaluation.  Note she did not have any further left upper extremity pain and continued without chest pain.  Her troponin is elevated and cardiology asked to evaluate.   Past Medical History:  Diagnosis Date  . Basal cell carcinoma 2021   L temple  . Chronic back pain   . Hypertension   . Scoliosis     Past Surgical History:  Procedure Laterality Date  . ABDOMINAL SURGERY    . bilateral hip replacement    . TONSILLECTOMY        Inpatient Medications: Scheduled Meds: . [START ON 06/12/2020] aspirin EC  81 mg Oral Daily  . atorvastatin  40 mg Oral Daily  . [START ON 06/12/2020] cholecalciferol  5,000 Units Oral Daily  . gabapentin  400 mg Oral QID  . ipratropium-albuterol  3 mL Nebulization Q6H  . nicotine  21 mg Transdermal Daily  . [START ON 06/12/2020] NIFEdipine  60 mg Oral Daily  . oxyCODONE ER  13.5 mg Oral Q12H  . sodium chloride flush  3 mL Intravenous Q12H   Continuous Infusions: . sodium chloride    . heparin     PRN Meds:   Allergies:     Allergies  Allergen Reactions  . Sulfa Antibiotics Other (See Comments)    Unknown, happened as a child    Social History:   Social History   Socioeconomic History  . Marital status: Single    Spouse name: Not on file  . Number of children: 0  . Years of education: Not on file  . Highest education level: Not on file  Occupational History  . Not on file  Tobacco Use  . Smoking status: Current Every Day Smoker    Packs/day: 5.00    Types: Cigarettes  . Smokeless tobacco: Never Used  Substance and Sexual Activity  . Alcohol use: Not Currently  . Drug use: Not Currently  . Sexual activity: Not on file  Other Topics Concern  . Not on file  Social History Narrative  . Not on file   Social Determinants of Health   Financial Resource Strain:   . Difficulty of Paying Living Expenses:   Food Insecurity:   . Worried About Charity fundraiser in the Last Year:   . Arboriculturist in the Last Year:   Transportation Needs:   . Film/video editor (Medical):   Marland Kitchen Lack of Transportation (Non-Medical):   Physical Activity:   . Days of Exercise per Week:   . Minutes of Exercise per Session:  Stress:   . Feeling of Stress :   Social Connections:   . Frequency of Communication with Friends and Family:   . Frequency of Social Gatherings with Friends and Family:   . Attends Religious Services:   . Active Member of Clubs or Organizations:   . Attends Archivist Meetings:   Marland Kitchen Marital Status:   Intimate Partner Violence:   . Fear of Current or Ex-Partner:   . Emotionally Abused:   Marland Kitchen Physically Abused:   . Sexually Abused:     Family History:     ROS:  Please see the history of present illness.  No fevers, chills, productive cough or hemoptysis. All other ROS reviewed and negative.     Physical Exam/Data:   Vitals:   06/11/20 1012  BP: (!) 115/64  Pulse: 99  Resp: 18  Temp: 97.7 F (36.5 C)  TempSrc: Oral  SpO2: 93%  Weight: 50.8 kg  Height: 5\' 7"   (1.702 m)   No intake or output data in the 24 hours ending 06/11/20 1250 Last 3 Weights 06/11/2020 01/11/2020  Weight (lbs) 112 lb 109 lb  Weight (kg) 50.803 kg 49.442 kg     Body mass index is 17.54 kg/m.  General:  Well nourished, well developed, in no acute distres HEENT: normal Lymph: no adenopathy Neck: no JVD Endocrine:  No thryomegaly Vascular: No carotid bruits; FA pulses 2+ bilaterally without bruits  Cardiac:  normal S1, S2; RRR; no murmur, positive gallop Lungs: Diminished breath sounds bases Abd: soft, nontender, no hepatomegaly  Ext: Trace to 1+ edema, chronic skin changes, diminished distal pulses Musculoskeletal:  No deformities, BUE and BLE strength normal and equal Skin: warm and dry  Neuro:  CNs 2-12 intact, no focal abnormalities noted Psych:  Normal affect   EKG:  The EKG was personally reviewed and demonstrates: Sinus rhythm, septal infarct.  Laboratory Data:  High Sensitivity Troponin:   Recent Labs  Lab 06/11/20 1042  TROPONINIHS 2,853*     Chemistry Recent Labs  Lab 06/11/20 1042  NA 130*  K 4.0  CL 95*  CO2 23  GLUCOSE 124*  BUN 8  CREATININE 0.81  CALCIUM 8.5*  GFRNONAA >60  GFRAA >60  ANIONGAP 12    Recent Labs  Lab 06/11/20 1042  PROT 6.5  ALBUMIN 3.8  AST 40  ALT 20  ALKPHOS 120  BILITOT 0.9   Hematology Recent Labs  Lab 06/11/20 1042  WBC 10.3  RBC 3.31*  HGB 9.5*  HCT 30.0*  MCV 90.6  MCH 28.7  MCHC 31.7  RDW 12.4  PLT 318   BNP Recent Labs  Lab 06/11/20 1042  BNP 1,621.3*     Radiology/Studies:  DG Chest 2 View  Result Date: 06/11/2020 CLINICAL DATA:  Short of breath. EXAM: CHEST - 2 VIEW COMPARISON:  None. FINDINGS: Cardiac silhouette is mildly enlarged. No mediastinal or hilar masses or evidence of adenopathy. Lungs are hyperexpanded. Bilateral pleural effusions obscure the hemidiaphragms. There is bilateral interstitial thickening. More confluent opacity is noted at the lung bases, likely atelectasis.  No pneumothorax. Skeletal structures are demineralized but grossly intact. IMPRESSION: 1. Findings support congestive heart failure/pulmonary edema superimposed on chronic changes of COPD. There is mild cardiomegaly, bilateral interstitial thickening and moderate pleural effusions, with additional lung base opacity that is likely due to atelectasis. Electronically Signed   By: Lajean Manes M.D.   On: 06/11/2020 11:49   [id number]} TIMI Risk Score for Unstable Angina or Non-ST Elevation MI:  The patient's TIMI risk score is 2, which indicates a 8% risk of all cause mortality, new or recurrent myocardial infarction or need for urgent revascularization in the next 14 days.   New York Heart Association (NYHA) Functional Class NYHA Class II  Assessment and Plan:   1. Non-ST elevation myocardial infarction-patient presents with sudden onset dyspnea and left upper extremity pain that was transient.  Troponin is elevated.  Presently pain-free.  Would treat with aspirin, heparin, statin and add low-dose metoprolol 12.5 mg twice daily.  I have recommended cardiac catheterization.  The risks and benefits including myocardial infarction, CVA and death discussed and she agrees to proceed.  We will arrange echocardiogram to assess LV function. 2. Acute CHF-LV function unknown.  We will arrange echocardiogram to further assess.  Would give Lasix 20 mg IV x1.  Follow renal function.  We will not hydrate prior to catheterization given CHF on chest x-ray and dyspnea. 3. Hypertension-we will discontinue Procardia.  Add metoprolol for non-ST elevation myocardial infarction.  Await follow-up echocardiogram.  Will likely need ARB (or Entresto if LV function reduced). 4. Tobacco abuse-patient counseled on discontinuing. 5. Normocytic anemia-further evaluation per primary care.  No signs of active bleeding.  For questions or updates, please contact [place] Please consult [email or web address] for contact info under     Signed, Kirk Ruths, MD  06/11/2020 12:50 PM

## 2020-06-11 NOTE — ED Notes (Signed)
Informed Dr Tamala Julian of pt trop 2,853

## 2020-06-11 NOTE — Consult Note (Signed)
ANTICOAGULATION CONSULT NOTE - Initial Consult  Pharmacy Consult for Heparin Infusion Dosing and Monitoring  Indication: chest pain/ACS  Allergies  Allergen Reactions  . Sulfa Antibiotics Other (See Comments)    Unknown, happened as a child    Patient Measurements: Height: 5\' 7"  (170.2 cm) Weight: 52.2 kg (115 lb) IBW/kg (Calculated) : 61.6  Vital Signs: Temp: 98.8 F (37.1 C) (08/01 1925) Temp Source: Oral (08/01 1925) BP: 118/63 (08/01 1925) Pulse Rate: 97 (08/01 1925)  Labs: Recent Labs    06/11/20 1042 06/11/20 1042 06/11/20 1233 06/11/20 1515 06/11/20 1812  HGB 9.5*  --   --   --   --   HCT 30.0*  --   --   --   --   PLT 318  --   --   --   --   APTT  --   --  >160*  --   --   LABPROT  --   --  14.1  --   --   INR  --   --  1.1  --   --   CREATININE 0.81  --   --   --   --   TROPONINIHS 2,853*   < > 2,600* 2,289* 2,383*   < > = values in this interval not displayed.    Estimated Creatinine Clearance: 43.4 mL/min (by C-G formula based on SCr of 0.81 mg/dL).   Medical History: Past Medical History:  Diagnosis Date  . Basal cell carcinoma 2021   L temple  . Chronic back pain   . Hypertension   . Scoliosis     Assessment: Pharmacy consulted for heparin infusion dosing and monitoring for 85 yo female admitted with ACS/STEMI  Troponin HS: 2853    BNP: 1621   Hgb: 9.5   Plts: 318   Goal of Therapy:  Heparin level 0.3-0.7 units/ml Monitor platelets by anticoagulation protocol: Yes   Plan:  Baseline labs ordered  Provider has already ordered 3050 unit bolus x 1 Start heparin infusion at 600 units/hr Check anti-Xa level in 8 hours and daily while on heparin Continue to monitor H&H and platelets  8/1:  Received called from NP @ ~ 20:10.  Heparin gtt was d/c'd @ 1430 by MD.   APTT was drawn @ 1233 resulted > 160 ; this prompted MD to inform RN to hold drip.  However,  The heparin bolus was given @ 1151 BEFORE aPTT was drawn which would account for  elevated level.   Since heparin gtt had been off for ~ 6 hrs, advised NP and RN to rebolus pt and restart gtt at previous rate.  Will retime HL for 8 hrs after restart.    Rockney Grenz D Clinical Pharmacist 06/11/2020 8:12 PM

## 2020-06-11 NOTE — Consult Note (Addendum)
ANTICOAGULATION CONSULT NOTE - Initial Consult  Pharmacy Consult for Heparin Infusion Dosing and Monitoring  Indication: chest pain/ACS  Allergies  Allergen Reactions  . Sulfa Antibiotics Other (See Comments)    Unknown, happened as a child    Patient Measurements: Height: 5\' 7"  (170.2 cm) Weight: 50.8 kg (112 lb) IBW/kg (Calculated) : 61.6  Vital Signs: Temp: 97.7 F (36.5 C) (08/01 1012) Temp Source: Oral (08/01 1012) BP: 115/64 (08/01 1012) Pulse Rate: 99 (08/01 1012)  Labs: Recent Labs    06/11/20 1042  HGB 9.5*  HCT 30.0*  PLT 318  CREATININE 0.81  TROPONINIHS 2,853*    Estimated Creatinine Clearance: 42.2 mL/min (by C-G formula based on SCr of 0.81 mg/dL).   Medical History: Past Medical History:  Diagnosis Date  . Basal cell carcinoma 2021   L temple    Assessment: Pharmacy consulted for heparin infusion dosing and monitoring for 85 yo female admitted with ACS/STEMI  Troponin HS: 2853    BNP: 1621   Hgb: 9.5   Plts: 318   Goal of Therapy:  Heparin level 0.3-0.7 units/ml Monitor platelets by anticoagulation protocol: Yes   Plan:  Baseline labs ordered  Provider has already ordered 3050 unit bolus x 1 Start heparin infusion at 600 units/hr Check anti-Xa level in 8 hours and daily while on heparin Continue to monitor H&H and platelets   Pernell Dupre, PharmD, BCPS Clinical Pharmacist 06/11/2020 12:01 PM

## 2020-06-11 NOTE — Consult Note (Addendum)
Cardiology Consultation:   Patient ID: [name] [name] MRN: [id number]; DOB: 04-20-1935  Admit date: 06/11/2020 Date of Consult: 06/11/2020  Primary Care Provider: Perrin Maltese, MD [facility] Cardiologist: New   Patient Profile:   [name] [name] is a 85 y.o. female with a hx of basal cell carcinoma who is being seen today for the evaluation of NSTEMI at the request of Vladimir Crofts MD.  History of Present Illness:   No prior cardiac history.  She typically complains of dyspnea only with more vigorous activities.  There is no history of orthopnea, PND, chest pain or syncope.  Yesterday after climbing stairs with groceries she developed sudden onset of dyspnea and an aching sensation in her left upper extremity.  Her pain lasted approximately 10 minutes and resolved.  No nausea or diaphoresis.  Her dyspnea improved but persisted.  When she awoke this morning she continued to have dyspnea and presented to the emergency room for evaluation.  Note she did not have any further left upper extremity pain and continued without chest pain.  Her troponin is elevated and cardiology asked to evaluate.   Past Medical History:  Diagnosis Date  . Basal cell carcinoma 2021   L temple  . Chronic back pain   . Hypertension   . Scoliosis     Past Surgical History:  Procedure Laterality Date  . ABDOMINAL SURGERY    . bilateral hip replacement    . TONSILLECTOMY        Inpatient Medications: Scheduled Meds: . [START ON 06/12/2020] aspirin EC  81 mg Oral Daily  . atorvastatin  40 mg Oral Daily  . [START ON 06/12/2020] cholecalciferol  5,000 Units Oral Daily  . gabapentin  400 mg Oral QID  . ipratropium-albuterol  3 mL Nebulization Q6H  . nicotine  21 mg Transdermal Daily  . [START ON 06/12/2020] NIFEdipine  60 mg Oral Daily  . oxyCODONE ER  13.5 mg Oral Q12H  . sodium chloride flush  3 mL Intravenous Q12H   Continuous Infusions: . sodium chloride    . heparin     PRN Meds:   Allergies:     Allergies  Allergen Reactions  . Sulfa Antibiotics Other (See Comments)    Unknown, happened as a child    Social History:   Social History   Socioeconomic History  . Marital status: Single    Spouse name: Not on file  . Number of children: 0  . Years of education: Not on file  . Highest education level: Not on file  Occupational History  . Not on file  Tobacco Use  . Smoking status: Current Every Day Smoker    Packs/day: 5.00    Types: Cigarettes  . Smokeless tobacco: Never Used  Substance and Sexual Activity  . Alcohol use: Not Currently  . Drug use: Not Currently  . Sexual activity: Not on file  Other Topics Concern  . Not on file  Social History Narrative  . Not on file   Social Determinants of Health   Financial Resource Strain:   . Difficulty of Paying Living Expenses:   Food Insecurity:   . Worried About Charity fundraiser in the Last Year:   . Arboriculturist in the Last Year:   Transportation Needs:   . Film/video editor (Medical):   Marland Kitchen Lack of Transportation (Non-Medical):   Physical Activity:   . Days of Exercise per Week:   . Minutes of Exercise per Session:  Stress:   . Feeling of Stress :   Social Connections:   . Frequency of Communication with Friends and Family:   . Frequency of Social Gatherings with Friends and Family:   . Attends Religious Services:   . Active Member of Clubs or Organizations:   . Attends Archivist Meetings:   Marland Kitchen Marital Status:   Intimate Partner Violence:   . Fear of Current or Ex-Partner:   . Emotionally Abused:   Marland Kitchen Physically Abused:   . Sexually Abused:     Family History:     ROS:  Please see the history of present illness.  No fevers, chills, productive cough or hemoptysis. All other ROS reviewed and negative.     Physical Exam/Data:   Vitals:   06/11/20 1012  BP: (!) 115/64  Pulse: 99  Resp: 18  Temp: 97.7 F (36.5 C)  TempSrc: Oral  SpO2: 93%  Weight: 50.8 kg  Height: 5\' 7"   (1.702 m)   No intake or output data in the 24 hours ending 06/11/20 1250 Last 3 Weights 06/11/2020 01/11/2020  Weight (lbs) 112 lb 109 lb  Weight (kg) 50.803 kg 49.442 kg     Body mass index is 17.54 kg/m.  General:  Well nourished, well developed, in no acute distres HEENT: normal Lymph: no adenopathy Neck: no JVD Endocrine:  No thryomegaly Vascular: No carotid bruits; FA pulses 2+ bilaterally without bruits  Cardiac:  normal S1, S2; RRR; no murmur, positive gallop Lungs: Diminished breath sounds bases Abd: soft, nontender, no hepatomegaly  Ext: Trace to 1+ edema, chronic skin changes, diminished distal pulses Musculoskeletal:  No deformities, BUE and BLE strength normal and equal Skin: warm and dry  Neuro:  CNs 2-12 intact, no focal abnormalities noted Psych:  Normal affect   EKG:  The EKG was personally reviewed and demonstrates: Sinus rhythm, septal infarct.  Laboratory Data:  High Sensitivity Troponin:   Recent Labs  Lab 06/11/20 1042  TROPONINIHS 2,853*     Chemistry Recent Labs  Lab 06/11/20 1042  NA 130*  K 4.0  CL 95*  CO2 23  GLUCOSE 124*  BUN 8  CREATININE 0.81  CALCIUM 8.5*  GFRNONAA >60  GFRAA >60  ANIONGAP 12    Recent Labs  Lab 06/11/20 1042  PROT 6.5  ALBUMIN 3.8  AST 40  ALT 20  ALKPHOS 120  BILITOT 0.9   Hematology Recent Labs  Lab 06/11/20 1042  WBC 10.3  RBC 3.31*  HGB 9.5*  HCT 30.0*  MCV 90.6  MCH 28.7  MCHC 31.7  RDW 12.4  PLT 318   BNP Recent Labs  Lab 06/11/20 1042  BNP 1,621.3*     Radiology/Studies:  DG Chest 2 View  Result Date: 06/11/2020 CLINICAL DATA:  Short of breath. EXAM: CHEST - 2 VIEW COMPARISON:  None. FINDINGS: Cardiac silhouette is mildly enlarged. No mediastinal or hilar masses or evidence of adenopathy. Lungs are hyperexpanded. Bilateral pleural effusions obscure the hemidiaphragms. There is bilateral interstitial thickening. More confluent opacity is noted at the lung bases, likely atelectasis.  No pneumothorax. Skeletal structures are demineralized but grossly intact. IMPRESSION: 1. Findings support congestive heart failure/pulmonary edema superimposed on chronic changes of COPD. There is mild cardiomegaly, bilateral interstitial thickening and moderate pleural effusions, with additional lung base opacity that is likely due to atelectasis. Electronically Signed   By: Lajean Manes M.D.   On: 06/11/2020 11:49   [id number]} TIMI Risk Score for Unstable Angina or Non-ST Elevation MI:  The patient's TIMI risk score is 2, which indicates a 8% risk of all cause mortality, new or recurrent myocardial infarction or need for urgent revascularization in the next 14 days.   New York Heart Association (NYHA) Functional Class NYHA Class II  Assessment and Plan:   1. Non-ST elevation myocardial infarction-patient presents with sudden onset dyspnea and left upper extremity pain that was transient.  Troponin is elevated.  Presently pain-free.  Would treat with aspirin, heparin, statin and add low-dose metoprolol 12.5 mg twice daily.  I have recommended cardiac catheterization.  The risks and benefits including myocardial infarction, CVA and death discussed and she agrees to proceed.  We will arrange echocardiogram to assess LV function. 2. Acute CHF-LV function unknown.  We will arrange echocardiogram to further assess.  Would give Lasix 20 mg IV x1.  Follow renal function.  We will not hydrate prior to catheterization given CHF on chest x-ray and dyspnea. 3. Hypertension-we will discontinue Procardia.  Add metoprolol for non-ST elevation myocardial infarction.  Await follow-up echocardiogram.  Will likely need ARB (or Entresto if LV function reduced). 4. Tobacco abuse-patient counseled on discontinuing. 5. Normocytic anemia-further evaluation per primary care.  No signs of active bleeding.  For questions or updates, please contact [place] Please consult [email or web address] for contact info under     Signed, Kirk Ruths, MD  06/11/2020 12:50 PM

## 2020-06-11 NOTE — Progress Notes (Signed)
Patient admitted to floor with Elevated Troponin (2852, 2600, 2289) with [facility] and bilateral lower extremity edema. Patient developed cough 2 days ago without production. Currently on 3L acute Oxygen. Uses walker and bedside toilet at home. Both at bedside in her room (232) now. Pleasant disposition. Had a fall in her parking lot at residence two weeks ago resulting in scab on right elbow. Has chronic pain from scoliosis. Takes gabapentin and Oxy routine at home. Plan to diurese patient and return to her home. She lives alone in an apartment and does not have anyone to help her. Patient would benefit from a possible meal delivery program. She does not cook and only eats warm meals when her sister prepares and brings to her, not often. I do not believe in her current state she has the stamina/strength to adequately prepare meals and take proper care of herself. I will contact case worker to see if she could qualify for some type of home assistance.

## 2020-06-11 NOTE — ED Notes (Signed)
Blue top sent to lab. 

## 2020-06-11 NOTE — ED Notes (Signed)
Pt given meal tray.

## 2020-06-11 NOTE — ED Notes (Signed)
Cardiologist at bedside.  

## 2020-06-11 NOTE — ED Provider Notes (Signed)
[facility] Emergency Department Provider Note ____________________________________________   First MD Initiated Contact with Patient 06/11/20 1017     (approximate)  I have reviewed the triage vital signs and the nursing notes.  HISTORY  Chief Complaint Shortness of Breath   HPI [name] [name] is a 85 y.o. female presents to the ED for evaluation of shortness of breath.  Chart review indicates chronic lower back pain for which patient is prescribed as needed oxycodone by pain management. History of bilateral hip replacements.  Patient reports recently moving to the area from [place].  Lives in apartment by herself.  Ambulatory independently and handles all of her own ADLs.  Patient reports climbing steps into her apartment yesterday morning, about 11 AM, and reports sudden onset left arm pain and sensation of dyspnea/shortness of breath.  She denies any chest pain, pressure, syncope or headache.  She reports the left arm pain was 7/10 aching intensity, resolving after matter of minutes-hours.  She reports her sensation of shortness of breath remains, which is why she presents today for evaluation.  She denies cough, productive cough, fever, recent illnesses.  She attributes her shortness of breath to recent medication change by pain management changing her from her chronic Norco.  Never had chest pain.    Past Medical History:  Diagnosis Date  . Basal cell carcinoma 2021   L temple  . Chronic back pain   . Hypertension   . Scoliosis     Patient Active Problem List   Diagnosis Date Noted  . Chronic back pain 06/11/2020  . Acute CHF (congestive heart failure) ([place]) 06/11/2020  . NSTEMI (non-ST elevated myocardial infarction) ([place]) 06/11/2020  . Hyponatremia 06/11/2020  . Normocytic anemia 06/11/2020  . Tobacco abuse 06/11/2020  . HTN (hypertension) 06/11/2020  . Chronic low back pain (1ry area of Pain) (Bilateral) (R>L) w/ sciatica (Bilateral)  01/12/2020  . Chronic lower extremity pain (2ry area of Pain) (Bilateral) (R>L) 01/12/2020  . Scoliosis (and kyphoscoliosis), idiopathic 01/12/2020  . DDD (degenerative disc disease), lumbosacral 01/12/2020  . History of total hip replacement (Bilateral) 01/12/2020  . Walker as ambulation aid 01/12/2020  . Chronic peripheral neuropathic pain (Bilateral) 01/12/2020  . Chronic pain syndrome 01/12/2020  . Pharmacologic therapy 01/12/2020  . Disorder of skeletal system 01/12/2020  . Problems influencing health status 01/12/2020    Past Surgical History:  Procedure Laterality Date  . ABDOMINAL SURGERY    . bilateral hip replacement    . TONSILLECTOMY      Prior to Admission medications   Medication Sig Start Date End Date Taking? Authorizing Provider  aspirin 81 MG chewable tablet Chew 81 mg by mouth daily.    Yes [provider]  Cholecalciferol (VITAMIN D-3) 125 MCG (5000 UT) TABS Take 5,000 Units by mouth daily.    Yes [provider]  gabapentin (NEURONTIN) 400 MG capsule Take 400 mg by mouth 4 (four) times daily.   Yes [provider]  Multiple Vitamins-Minerals (ICAPS AREDS 2 PO) Take 1 tablet by mouth daily.   Yes [provider]  NIFEdipine (ADALAT CC) 60 MG 24 hr tablet Take 60 mg by mouth daily.   Yes [provider]  oxyCODONE (OXY IR/ROXICODONE) 5 MG immediate release tablet Take 5 mg by mouth 2 (two) times daily as needed for breakthrough pain.    Yes [provider]  XTAMPZA ER 13.5 MG C12A Take 13.5 mg by mouth every 12 (twelve) hours. 05/19/20  Yes [provider]  Allergies Sulfa antibiotics  Family History  Problem Relation Age of Onset  . CVA Father   . Heart disease Father   . Arthritis/Rheumatoid Sister     Social History Social History   Tobacco Use  . Smoking status: Current Every Day Smoker    Packs/day: 5.00    Types: Cigarettes  . Smokeless tobacco: Never Used  Substance Use Topics  .  Alcohol use: Not Currently  . Drug use: Not Currently    Review of Systems  Constitutional: No fever/chills Eyes: No visual changes. ENT: No sore throat. Cardiovascular: Denies chest pain. Respiratory: Positive shortness of breath Gastrointestinal: No abdominal pain.  No nausea, no vomiting.  No diarrhea.  No constipation. Genitourinary: Negative for dysuria. Musculoskeletal: Negative for back pain.  Positive for left arm pain Skin: Negative for rash. Neurological: Negative for headaches, focal weakness or numbness.   ____________________________________________   PHYSICAL EXAM:  VITAL SIGNS: Vitals:   06/11/20 1300 06/11/20 1310  BP: (!) 135/69   Pulse: (!) 109   Resp: 23   Temp:    SpO2:  94%      Constitutional: Alert and oriented. Well appearing and in no acute distress.  Sitting up in bed, conversational full sentences.  Pleasant and joking. Eyes: Conjunctivae are normal. PERRL. EOMI. Head: Atraumatic. Nose: No congestion/rhinnorhea. Mouth/Throat: Mucous membranes are moist.  Oropharynx non-erythematous. Neck: No stridor. No cervical spine tenderness to palpation. Cardiovascular: Normal rate, regular rhythm. Grossly normal heart sounds.  Good peripheral circulation. Respiratory: Normal respiratory effort.  No retractions. Lungs CTAB. Gastrointestinal: Soft , nondistended, nontender to palpation. No abdominal bruits. No CVA tenderness. Musculoskeletal: No lower extremity tenderness nor edema.  No joint effusions. No signs of acute trauma. Neurologic:  Normal speech and language. No gross focal neurologic deficits are appreciated. No gait instability noted. Skin:  Skin is warm, dry and intact. No rash noted. Psychiatric: Mood and affect are normal. Speech and behavior are normal.  ____________________________________________   LABS (all labs ordered are listed, but only abnormal results are displayed)  Labs Reviewed  CBC WITH DIFFERENTIAL/PLATELET - Abnormal;  Notable for the following components:      Result Value   RBC 3.31 (*)    Hemoglobin 9.5 (*)    HCT 30.0 (*)    Neutro Abs 8.6 (*)    All other components within normal limits  COMPREHENSIVE METABOLIC PANEL - Abnormal; Notable for the following components:   Sodium 130 (*)    Chloride 95 (*)    Glucose, Bld 124 (*)    Calcium 8.5 (*)    All other components within normal limits  BRAIN NATRIURETIC PEPTIDE - Abnormal; Notable for the following components:   B Natriuretic Peptide 1,621.3 (*)    All other components within normal limits  TROPONIN I (HIGH SENSITIVITY) - Abnormal; Notable for the following components:   Troponin I (High Sensitivity) 2,853 (*)    All other components within normal limits  TROPONIN I (HIGH SENSITIVITY) - Abnormal; Notable for the following components:   Troponin I (High Sensitivity) 2,600 (*)    All other components within normal limits  SARS CORONAVIRUS 2 BY RT PCR (HOSPITAL ORDER, [facility])  URINE DRUG SCREEN, QUALITATIVE ([facility] ONLY)  APTT  PROTIME-INR  HEPARIN LEVEL (UNFRACTIONATED)   ____________________________________________  12 Lead EKG   ____________________________________________  RADIOLOGY  ED MD interpretation: Volume overload  Official radiology report(s): DG Chest 2 View  Result Date: 06/11/2020 CLINICAL DATA:  Short of breath.  EXAM: CHEST - 2 VIEW COMPARISON:  None. FINDINGS: Cardiac silhouette is mildly enlarged. No mediastinal or hilar masses or evidence of adenopathy. Lungs are hyperexpanded. Bilateral pleural effusions obscure the hemidiaphragms. There is bilateral interstitial thickening. More confluent opacity is noted at the lung bases, likely atelectasis. No pneumothorax. Skeletal structures are demineralized but grossly intact. IMPRESSION: 1. Findings support congestive heart failure/pulmonary edema superimposed on chronic changes of COPD. There is mild cardiomegaly, bilateral interstitial  thickening and moderate pleural effusions, with additional lung base opacity that is likely due to atelectasis. Electronically Signed   By: Lajean Manes M.D.   On: 06/11/2020 11:49    ____________________________________________   PROCEDURES and INTERVENTIONS  Procedure(s) performed (including Critical Care):  CRITICAL CARE Performed by: Vladimir Crofts   Total critical care time: 33 minutes  Critical care time was exclusive of separately billable procedures and treating other patients.  Critical care was necessary to treat or prevent imminent or life-threatening deterioration.  Critical care was time spent personally by me on the following activities: development of treatment plan with patient and/or surrogate as well as nursing, discussions with consultants, evaluation of patient's response to treatment, examination of patient, obtaining history from patient or surrogate, ordering and performing treatments and interventions, ordering and review of laboratory studies, ordering and review of radiographic studies, pulse oximetry and re-evaluation of patient's condition.   Procedures  Medications  heparin ADULT infusion 100 units/mL (25000 units/234mL sodium chloride 0.45%) (600 Units/hr Intravenous New Bag/Given 06/11/20 1321)  ondansetron (ZOFRAN) injection 4 mg (has no administration in time range)  acetaminophen (TYLENOL) tablet 650 mg (has no administration in time range)  hydrALAZINE (APRESOLINE) injection 5 mg (has no administration in time range)  nicotine (NICODERM CQ - dosed in mg/24 hours) patch 21 mg (has no administration in time range)  albuterol (PROVENTIL) (2.5 MG/3ML) 0.083% nebulizer solution 2.5 mg (has no administration in time range)  dextromethorphan-guaiFENesin (MUCINEX DM) 30-600 MG per 12 hr tablet 1 tablet (has no administration in time range)  ipratropium-albuterol (DUONEB) 0.5-2.5 (3) MG/3ML nebulizer solution 3 mL (has no administration in time range)   nitroGLYCERIN (NITROSTAT) SL tablet 0.4 mg (has no administration in time range)  sodium chloride flush (NS) 0.9 % injection 3 mL (has no administration in time range)  sodium chloride flush (NS) 0.9 % injection 3 mL (has no administration in time range)  0.9 %  sodium chloride infusion (has no administration in time range)  aspirin EC tablet 81 mg (has no administration in time range)  gabapentin (NEURONTIN) capsule 400 mg (has no administration in time range)  cholecalciferol (VITAMIN D3) tablet 5,000 Units (has no administration in time range)  atorvastatin (LIPITOR) tablet 40 mg (has no administration in time range)  oxyCODONE (Oxy IR/ROXICODONE) immediate release tablet 5 mg (has no administration in time range)  metoprolol tartrate (LOPRESSOR) tablet 12.5 mg (has no administration in time range)  furosemide (LASIX) tablet 20 mg (has no administration in time range)  heparin injection 3,050 Units (3,050 Units Intravenous Given 06/11/20 1151)  aspirin chewable tablet 324 mg (324 mg Oral Given 06/11/20 1154)    ____________________________________________   INITIAL IMPRESSION / ASSESSMENT AND PLAN / ED COURSE  85 year old woman without cardiac history presenting with dyspnea, most consistent with NSTEMI, requiring heparin drip and medical admission.  Hemodynamically stable.  Initially tachycardic, resolving without intervention, and she is not hypoxic on room air.  Reassuring exam with no evidence of acute distress, neurovascular deficits or significant acute pathology.  While her  EKG is nonischemic, her troponin is nearly 3000.  She reports continued symptoms of dyspnea, reports they are marginally improved from yesterday.  Provided aspirin and heparin drip/infusion.  Consult to cardiology has has no further recommendations at this time.  Will admit the patient to hospitalist medicine for further work-up and management of her NSTEMI.   Clinical Course as of Jun 11 1353  Nancy Fetter Jun 11, 2020   1142 RN informs me of critically elevated troponin.  Heparin ordered, twelve-lead EKG reviewed again and without evidence of STEMI.   [DS]    Clinical Course User Index [DS] Vladimir Crofts, MD     ____________________________________________   FINAL CLINICAL IMPRESSION(S) / ED DIAGNOSES  Final diagnoses:  NSTEMI (non-ST elevated myocardial infarction) ([place])  Shortness of breath  Left arm pain  Chronic pain syndrome  Elevated troponin     ED Discharge Orders    None       Riah Kehoe   Note:  This document was prepared using Dragon voice recognition software and may include unintentional dictation errors.   Vladimir Crofts, MD 06/11/20 [phone]

## 2020-06-11 NOTE — H&P (Signed)
History and Physical    Chandell Attridge ERX:[id number] DOB: May 01, 1935 DOA: 06/11/2020  Referring MD/NP/PA:   PCP: Perrin Maltese, MD   Patient coming from:  The patient is coming from home.  At baseline, pt is independent for most of ADL.        Chief Complaint: Shortness of breath  HPI: [name] [name] is a 85 y.o. female with medical history significant of hypertension, basal cell carcinoma, chronic back pain, scoliosis, tobacco abuse, who presents with shortness of breath.  Patient states that her shortness breath started yesterday, which has been progressively worsening. Pt reports that she was recently started on a new pain medication, Pt started taking Xtampza on 7/9. Pt thinks that the shortness of breath is related to this new pain medication. Pt states that she feels like her throat is closing up.  She does not have chest pain, no fever or chills.  She has cough, mostly dry cough.  Denies nausea, vomiting, diarrhea, abdominal pain, symptoms of UTI or unilateral weakness.  No tenderness in calf areas.  ED Course: pt was found to have troponin 2852, BNP 1621, pending COVID-19 PCR, sodium 130, renal function okay, temperature 97.7, blood pressure 115/64, heart rate 99, RR 18, oxygen saturation 93% on room air.  Chest x-ray showed cardiomegaly, pulmonary edema, patient is admitted to progressive bed as inpatient.  Dr. Stanford Breed of cardiology is consulted.  Review of Systems:   General: no fevers, chills, no body weight gain, has fatigue HEENT: no blurry vision, hearing changes or sore throat Respiratory: has dyspnea, coughing, no wheezing CV: no chest pain, no palpitations GI: no nausea, vomiting, abdominal pain, diarrhea, constipation GU: no dysuria, burning on urination, increased urinary frequency, hematuria  Ext: has leg edema Neuro: no unilateral weakness, numbness, or tingling, no vision change or hearing loss Skin: no rash, no skin tear. MSK: No muscle spasm, no deformity, no  limitation of range of movement in spin Heme: No easy bruising.  Travel history: No recent long distant travel.  Allergy:  Allergies  Allergen Reactions  . Sulfa Antibiotics Other (See Comments)    Unknown, happened as a child    Past Medical History:  Diagnosis Date  . Basal cell carcinoma 2021   L temple  . Chronic back pain   . Hypertension   . Scoliosis     Past Surgical History:  Procedure Laterality Date  . ABDOMINAL SURGERY    . bilateral hip replacement    . TONSILLECTOMY      Social History:  reports that she has been smoking cigarettes. She has been smoking about 5.00 packs per day. She has never used smokeless tobacco. She reports previous alcohol use. She reports previous drug use.  Family History:  Family History  Problem Relation Age of Onset  . CVA Father   . Heart disease Father   . Arthritis/Rheumatoid Sister      Prior to Admission medications   Medication Sig Start Date End Date Taking? Authorizing Provider  aspirin 81 MG chewable tablet Chew 81 mg by mouth daily.    Yes [provider]  Cholecalciferol (VITAMIN D-3) 125 MCG (5000 UT) TABS Take 5,000 Units by mouth daily.    Yes [provider]  gabapentin (NEURONTIN) 400 MG capsule Take 400 mg by mouth 4 (four) times daily.   Yes [provider]  Multiple Vitamins-Minerals (ICAPS AREDS 2 PO) Take 1 tablet by mouth daily.   Yes [provider]  NIFEdipine (ADALAT CC) 60 MG  24 hr tablet Take 60 mg by mouth daily.   Yes [provider]  oxyCODONE (OXY IR/ROXICODONE) 5 MG immediate release tablet Take 5 mg by mouth 2 (two) times daily as needed for breakthrough pain.    Yes [provider]  XTAMPZA ER 13.5 MG C12A Take 13.5 mg by mouth every 12 (twelve) hours. 05/19/20  Yes [provider]    Physical Exam: Vitals:   06/11/20 1012 06/11/20 1200 06/11/20 1230  BP: (!) 115/64 124/80 (!) 132/60  Pulse: 99 95 99  Resp: 18    Temp: 97.7 F  (36.5 C)    TempSrc: Oral    SpO2: 93% 93% 95%  Weight: 50.8 kg    Height: 5\' 7"  (1.702 m)     General: Not in acute distress HEENT:       Eyes: PERRL, EOMI, no scleral icterus.       ENT: No discharge from the ears and nose, no pharynx injection, no tonsillar enlargement.        Neck: no JVD, no bruit, no mass felt. Heme: No neck lymph node enlargement. Cardiac: S1/S2, RRR, No murmurs, No gallops or rubs. Respiratory: Has decreased air movement bilaterally.  No rales, wheezing, rhonchi or rubs. GI: Soft, nondistended, nontender, no rebound pain, no organomegaly, BS present. GU: No hematuria Ext: 1+ pitting leg edema bilaterally.  Has chronic venous insufficiency change in both legs.  1+DP/PT pulse bilaterally. Musculoskeletal: No joint deformities, No joint redness or warmth, no limitation of ROM in spin. Skin: No rashes.  Neuro: Alert, oriented X3, cranial nerves II-XII grossly intact, moves all extremities normally. Psych: Patient is not psychotic, no suicidal or hemocidal ideation.  Labs on Admission: I have personally reviewed following labs and imaging studies  CBC: Recent Labs  Lab 06/11/20 1042  WBC 10.3  NEUTROABS 8.6*  HGB 9.5*  HCT 30.0*  MCV 90.6  PLT 767   Basic Metabolic Panel: Recent Labs  Lab 06/11/20 1042  NA 130*  K 4.0  CL 95*  CO2 23  GLUCOSE 124*  BUN 8  CREATININE 0.81  CALCIUM 8.5*   GFR: Estimated Creatinine Clearance: 42.2 mL/min (by C-G formula based on SCr of 0.81 mg/dL). Liver Function Tests: Recent Labs  Lab 06/11/20 1042  AST 40  ALT 20  ALKPHOS 120  BILITOT 0.9  PROT 6.5  ALBUMIN 3.8   No results for input(s): LIPASE, AMYLASE in the last 168 hours. No results for input(s): AMMONIA in the last 168 hours. Coagulation Profile: No results for input(s): INR, PROTIME in the last 168 hours. Cardiac Enzymes: No results for input(s): CKTOTAL, CKMB, CKMBINDEX, TROPONINI in the last 168 hours. BNP (last 3 results) No results for  input(s): PROBNP in the last 8760 hours. HbA1C: No results for input(s): HGBA1C in the last 72 hours. CBG: No results for input(s): GLUCAP in the last 168 hours. Lipid Profile: No results for input(s): CHOL, HDL, LDLCALC, TRIG, CHOLHDL, LDLDIRECT in the last 72 hours. Thyroid Function Tests: No results for input(s): TSH, T4TOTAL, FREET4, T3FREE, THYROIDAB in the last 72 hours. Anemia Panel: No results for input(s): VITAMINB12, FOLATE, FERRITIN, TIBC, IRON, RETICCTPCT in the last 72 hours. Urine analysis: No results found for: COLORURINE, APPEARANCEUR, LABSPEC, PHURINE, GLUCOSEU, HGBUR, BILIRUBINUR, KETONESUR, PROTEINUR, UROBILINOGEN, NITRITE, LEUKOCYTESUR Sepsis Labs: @LABRCNTIP (procalcitonin:4,lacticidven:4) )No results found for this or any previous visit (from the past 240 hour(s)).   Radiological Exams on Admission: DG Chest 2 View  Result Date: 06/11/2020 CLINICAL DATA:  Short of breath.  EXAM: CHEST - 2 VIEW COMPARISON:  None. FINDINGS: Cardiac silhouette is mildly enlarged. No mediastinal or hilar masses or evidence of adenopathy. Lungs are hyperexpanded. Bilateral pleural effusions obscure the hemidiaphragms. There is bilateral interstitial thickening. More confluent opacity is noted at the lung bases, likely atelectasis. No pneumothorax. Skeletal structures are demineralized but grossly intact. IMPRESSION: 1. Findings support congestive heart failure/pulmonary edema superimposed on chronic changes of COPD. There is mild cardiomegaly, bilateral interstitial thickening and moderate pleural effusions, with additional lung base opacity that is likely due to atelectasis. Electronically Signed   By: Lajean Manes M.D.   On: 06/11/2020 11:49     EKG: Independently reviewed.  Sinus rhythm, QTC 437, anteroseptal infarction pattern, nonspecific T wave change.  Assessment/Plan Principal Problem:   Acute CHF (congestive heart failure) ([facility]) Active Problems:   Chronic back pain   NSTEMI  (non-ST elevated myocardial infarction) ([facility])   Hyponatremia   Normocytic anemia   Tobacco abuse   HTN (hypertension)   Acute CHF (congestive heart failure) [facility]): Patient has bilateral leg edema, elevated BNP 1621, pulmonary edema on chest x-ray, clinically consistent with acute CHF.  No 2D echo on record, not sure which type of of CHF.  Given long history of hypertension, patient may have diastolic congestive heart failure.  Cardiology, Dr. Stanford Breed is consulted.  -Will admit to progressive unit as inpatient -Lasix 20 mg by IV once dose, reevaluate in AM -trend trop -2d echo -Daily weights -strict I/O's -Low salt diet -Fluid restriction -Obtain REDs Vest reading  NSTEMI (non-ST elevated myocardial infarction) ([place]): trop 2852. No chest pain currently. - Trend Trop - Repeat EKG in the am  - prn Nitroglycerin - aspirin - start lipitor 40 mg daily - Risk factor stratification: will check FLP and A1C  - check UDS - 2d echo - IV heparin started in ED  Chronic back pain -prn oxycodone -Patient wants to discontinue Xtampza, d/c'ed  Hyponatremia: mild, Na 130. -Fluid restriction -Follow-up by BMP  Normocytic anemia: Hgb 9.5. No active bleeding.  Likely chronic. -f/u by CBC -f/u with PCP  Tobacco abuse -Nicotine patch  HTN: -IV hydralazine as needed -Continue nifedipine    DVT ppx: On IV heparin Code Status:  Partial code (I discussed with the patient in the presence of brother-in-law, and explained the meaning of CODE STATUS, patient wants to be partial code, OK for CPR, but no intubation). Family Communication:   Yes, patient's brother-in-law at bed side Disposition Plan:  Anticipate discharge back to previous environment Consults called: Dr. Stanford Breed of cardiology is consulted.  Admission status: progressive unit as inpt    Status is: Inpatient  Remains inpatient appropriate because:Inpatient level of care appropriate due to severity of illness.  Patient has  multiple comorbidities, now presents with acute CHF and non-STEMI.  Her presentation is highly complicated.  Given her old age, patient is at high risk of deterioration.  Patient will need to be treated in hospital for at least 2 days.   Dispo: The patient is from: Home              Anticipated d/c is to: Home              Anticipated d/c date is: 2 days              Patient currently is not medically stable to d/c.             Date of Service 06/11/2020    Ivor Costa [facility]  If 7PM-7AM, please contact night-coverage [email or web address] 06/11/2020, 1:06 PM

## 2020-06-12 ENCOUNTER — Inpatient Hospital Stay (HOSPITAL_COMMUNITY)
Admit: 2020-06-12 | Discharge: 2020-06-12 | Disposition: A | Discharge status: Home / Self Care | Payer: Medicare Other | Attending: Internal Medicine | Admitting: Internal Medicine

## 2020-06-12 ENCOUNTER — Encounter
Admission: EM | Disposition: A | Discharge status: Home / Self Care | Payer: Self-pay | Source: Home / Self Care | Attending: Internal Medicine

## 2020-06-12 ENCOUNTER — Encounter: Payer: Self-pay | Admitting: Internal Medicine

## 2020-06-12 DIAGNOSIS — I5031 Acute diastolic (congestive) heart failure: Secondary | ICD-10-CM

## 2020-06-12 DIAGNOSIS — I2510 Atherosclerotic heart disease of native coronary artery without angina pectoris: Secondary | ICD-10-CM

## 2020-06-12 DIAGNOSIS — I10 Essential (primary) hypertension: Secondary | ICD-10-CM

## 2020-06-12 HISTORY — PX: CORONARY STENT INTERVENTION: CATH118234

## 2020-06-12 HISTORY — PX: LEFT HEART CATH AND CORONARY ANGIOGRAPHY: CATH118249

## 2020-06-12 LAB — RETICULOCYTES
Immature Retic Fract: 10 % (ref 2.3–15.9)
RBC.: 3.39 MIL/uL — ABNORMAL LOW (ref 3.87–5.11)
Retic Count, Absolute: 50.5 10*3/uL (ref 19.0–186.0)
Retic Ct Pct: 1.5 % (ref 0.4–3.1)

## 2020-06-12 LAB — CBC
HCT: 31.3 % — ABNORMAL LOW (ref 36.0–46.0)
Hemoglobin: 10.2 g/dL — ABNORMAL LOW (ref 12.0–15.0)
MCH: 28.7 pg (ref 26.0–34.0)
MCHC: 32.6 g/dL (ref 30.0–36.0)
MCV: 87.9 fL (ref 80.0–100.0)
Platelets: 316 10*3/uL (ref 150–400)
RBC: 3.56 MIL/uL — ABNORMAL LOW (ref 3.87–5.11)
RDW: 12.7 % (ref 11.5–15.5)
WBC: 9.9 10*3/uL (ref 4.0–10.5)
nRBC: 0 % (ref 0.0–0.2)

## 2020-06-12 LAB — BASIC METABOLIC PANEL
Anion gap: 7 (ref 5–15)
BUN: 9 mg/dL (ref 8–23)
CO2: 28 mmol/L (ref 22–32)
Calcium: 8.2 mg/dL — ABNORMAL LOW (ref 8.9–10.3)
Chloride: 101 mmol/L (ref 98–111)
Creatinine, Ser: 0.78 mg/dL (ref 0.44–1.00)
GFR calc Af Amer: >60 mL/min (ref >60)
GFR calc non Af Amer: >60 mL/min (ref >60)
Glucose, Bld: 119 mg/dL — ABNORMAL HIGH (ref 70–99)
Potassium: 3.7 mmol/L (ref 3.5–5.1)
Sodium: 136 mmol/L (ref 135–145)

## 2020-06-12 LAB — VITAMIN B12: Vitamin B-12: 228 pg/mL (ref 180–914)

## 2020-06-12 LAB — ECHOCARDIOGRAM COMPLETE
AR max vel: 3.38 cm2
AV Area VTI: 4.5 cm2
AV Area mean vel: 3.37 cm2
AV Mean grad: 3 mmHg
AV Peak grad: 5 mmHg
Ao pk vel: 1.12 m/s
Area-P 1/2: 5.27 cm2
Height: 67 in
S' Lateral: 2.71 cm
Weight: 1837.75 oz

## 2020-06-12 LAB — IRON AND TIBC
Iron: 20 ug/dL — ABNORMAL LOW (ref 28–170)
Saturation Ratios: 6 % — ABNORMAL LOW (ref 10.4–31.8)
TIBC: 335 ug/dL (ref 250–450)
UIBC: 315 ug/dL

## 2020-06-12 LAB — FOLATE: Folate: 17.3 ng/mL (ref >5.9)

## 2020-06-12 LAB — HEPARIN LEVEL (UNFRACTIONATED)
Heparin Unfractionated: 0.18 IU/mL — ABNORMAL LOW (ref 0.30–0.70)
Heparin Unfractionated: 0.18 IU/mL — ABNORMAL LOW (ref 0.30–0.70)

## 2020-06-12 LAB — LIPID PANEL
Cholesterol: 179 mg/dL (ref 0–200)
HDL: 67 mg/dL (ref >40)
LDL Cholesterol: 95 mg/dL (ref 0–99)
Total CHOL/HDL Ratio: 2.7 RATIO
Triglycerides: 84 mg/dL (ref <150)
VLDL: 17 mg/dL (ref 0–40)

## 2020-06-12 LAB — HEMOGLOBIN A1C
Hgb A1c MFr Bld: 5.5 % (ref 4.8–5.6)
Mean Plasma Glucose: 111.15 mg/dL

## 2020-06-12 LAB — MAGNESIUM: Magnesium: 2 mg/dL (ref 1.7–2.4)

## 2020-06-12 LAB — FERRITIN: Ferritin: 21 ng/mL (ref 11–307)

## 2020-06-12 LAB — POCT ACTIVATED CLOTTING TIME: Activated Clotting Time: 268 seconds

## 2020-06-12 SURGERY — LEFT HEART CATH AND CORONARY ANGIOGRAPHY
Anesthesia: Moderate Sedation

## 2020-06-12 MED ORDER — LIDOCAINE HCL (PF) 1 % IJ SOLN
INTRAMUSCULAR | Status: DC | PRN
  Administered 2020-06-12: 2 mL

## 2020-06-12 MED ORDER — SODIUM CHLORIDE 0.9 % IV SOLN: 250.0000 mL | INTRAVENOUS | Status: DC | PRN

## 2020-06-12 MED ORDER — CLOPIDOGREL BISULFATE 75 MG PO TABS
ORAL_TABLET | ORAL | Status: DC | PRN
  Administered 2020-06-12: 600 mg via ORAL

## 2020-06-12 MED ORDER — HEPARIN SODIUM (PORCINE) 1000 UNIT/ML IJ SOLN
INTRAMUSCULAR | Status: AC
  Filled 2020-06-12: qty 1

## 2020-06-12 MED ORDER — VERAPAMIL HCL 2.5 MG/ML IV SOLN
INTRAVENOUS | Status: DC | PRN
  Administered 2020-06-12: 2.5 mg via INTRA_ARTERIAL

## 2020-06-12 MED ORDER — FENTANYL CITRATE (PF) 100 MCG/2ML IJ SOLN
INTRAMUSCULAR | Status: AC
  Filled 2020-06-12: qty 2

## 2020-06-12 MED ORDER — HEPARIN BOLUS VIA INFUSION
1500.0000 [IU] | Freq: Once | INTRAVENOUS | Status: AC
  Administered 2020-06-12: 1500 [IU] via INTRAVENOUS
  Filled 2020-06-12: qty 1500

## 2020-06-12 MED ORDER — NITROGLYCERIN 1 MG/10 ML FOR IR/CATH LAB
INTRA_ARTERIAL | Status: DC | PRN
  Administered 2020-06-12: 200 ug via INTRACORONARY

## 2020-06-12 MED ORDER — MIDAZOLAM HCL 2 MG/2ML IJ SOLN
INTRAMUSCULAR | Status: AC
  Filled 2020-06-12: qty 2

## 2020-06-12 MED ORDER — IOHEXOL 300 MG/ML  SOLN
INTRAMUSCULAR | Status: DC | PRN
  Administered 2020-06-12: 150 mL

## 2020-06-12 MED ORDER — HEPARIN (PORCINE) IN NACL 1000-0.9 UT/500ML-% IV SOLN
INTRAVENOUS | Status: AC
  Filled 2020-06-12: qty 1000

## 2020-06-12 MED ORDER — HEPARIN BOLUS VIA INFUSION
1500.0000 [IU] | Freq: Once | INTRAVENOUS | Status: DC
  Filled 2020-06-12: qty 1500

## 2020-06-12 MED ORDER — VERAPAMIL HCL 2.5 MG/ML IV SOLN
INTRAVENOUS | Status: AC
  Filled 2020-06-12: qty 2

## 2020-06-12 MED ORDER — SODIUM CHLORIDE 0.9% FLUSH: 3.0000 mL | INTRAVENOUS | Status: DC | PRN

## 2020-06-12 MED ORDER — MIDAZOLAM HCL 2 MG/2ML IJ SOLN
INTRAMUSCULAR | Status: DC | PRN
  Administered 2020-06-12: 1 mg via INTRAVENOUS

## 2020-06-12 MED ORDER — HEPARIN SODIUM (PORCINE) 1000 UNIT/ML IJ SOLN
INTRAMUSCULAR | Status: DC | PRN
  Administered 2020-06-12: 2500 [IU] via INTRAVENOUS
  Administered 2020-06-12: 3000 [IU] via INTRAVENOUS

## 2020-06-12 MED ORDER — ASPIRIN 81 MG PO CHEW: 81.0000 mg | CHEWABLE_TABLET | ORAL | Status: DC

## 2020-06-12 MED ORDER — CLOPIDOGREL BISULFATE 75 MG PO TABS
75.0000 mg | ORAL_TABLET | Freq: Every day | ORAL | Status: DC
  Administered 2020-06-13 – 2020-06-14 (×2): 75 mg via ORAL
  Filled 2020-06-12 (×2): qty 1

## 2020-06-12 MED ORDER — LIDOCAINE HCL (PF) 1 % IJ SOLN
INTRAMUSCULAR | Status: AC
  Filled 2020-06-12: qty 30

## 2020-06-12 MED ORDER — SODIUM CHLORIDE 0.9 % IV SOLN: INTRAVENOUS | Status: DC

## 2020-06-12 MED ORDER — FENTANYL CITRATE (PF) 100 MCG/2ML IJ SOLN
INTRAMUSCULAR | Status: DC | PRN
  Administered 2020-06-12: 25 ug via INTRAVENOUS
  Administered 2020-06-12: 50 ug via INTRAVENOUS
  Administered 2020-06-12: 25 ug via INTRAVENOUS

## 2020-06-12 MED ORDER — CLOPIDOGREL BISULFATE 300 MG PO TABS
ORAL_TABLET | ORAL | Status: AC
  Filled 2020-06-12: qty 2

## 2020-06-12 MED ORDER — OXYCODONE HCL 5 MG PO TABS
ORAL_TABLET | ORAL | Status: AC
  Filled 2020-06-12: qty 1

## 2020-06-12 MED ORDER — SODIUM CHLORIDE 0.9 % WEIGHT BASED INFUSION: 1.0000 mL/kg/h | INTRAVENOUS | Status: AC

## 2020-06-12 MED ORDER — SODIUM CHLORIDE 0.9% FLUSH
3.0000 mL | Freq: Two times a day (BID) | INTRAVENOUS | Status: DC
  Administered 2020-06-13 (×2): 3 mL via INTRAVENOUS

## 2020-06-12 MED ORDER — METOPROLOL TARTRATE 25 MG PO TABS
25.0000 mg | ORAL_TABLET | Freq: Two times a day (BID) | ORAL | Status: DC
  Administered 2020-06-12: 25 mg via ORAL
  Filled 2020-06-12: qty 1

## 2020-06-12 MED ORDER — IPRATROPIUM-ALBUTEROL 0.5-2.5 (3) MG/3ML IN SOLN
3.0000 mL | Freq: Two times a day (BID) | RESPIRATORY_TRACT | Status: DC
  Administered 2020-06-13 – 2020-06-14 (×3): 3 mL via RESPIRATORY_TRACT
  Filled 2020-06-12 (×3): qty 3

## 2020-06-12 SURGICAL SUPPLY — 17 items
BALLN TREK RX 2.5X12 (BALLOONS) ×3
BALLN ~~LOC~~ TREK RX 3.0X12 (BALLOONS) ×3
BALLOON TREK RX 2.5X12 (BALLOONS) IMPLANT
BALLOON ~~LOC~~ TREK RX 3.0X12 (BALLOONS) IMPLANT
CATH INFINITI 5FR JK (CATHETERS) ×2 IMPLANT
CATH INFINITI JR4 5F (CATHETERS) ×2 IMPLANT
CATH LAUNCHER 5F EBU3.5 (CATHETERS) ×2 IMPLANT
DEVICE INFLAT 30 PLUS (MISCELLANEOUS) ×2 IMPLANT
DEVICE RAD TR BAND REGULAR (VASCULAR PRODUCTS) ×2 IMPLANT
GLIDESHEATH SLEND SS 6F .021 (SHEATH) ×2 IMPLANT
GUIDEWIRE INQWIRE 1.5J.035X260 (WIRE) IMPLANT
INQWIRE 1.5J .035X260CM (WIRE) ×3
KIT MANI 3VAL PERCEP (MISCELLANEOUS) ×3 IMPLANT
PACK CARDIAC CATH (CUSTOM PROCEDURE TRAY) ×3 IMPLANT
STENT RESOLUTE ONYX 2.75X15 (Permanent Stent) ×2 IMPLANT
WIRE HITORQ VERSACORE ST 145CM (WIRE) ×2 IMPLANT
WIRE RUNTHROUGH .014X180CM (WIRE) ×2 IMPLANT

## 2020-06-12 NOTE — Consult Note (Signed)
ANTICOAGULATION CONSULT NOTE - Initial Consult  Pharmacy Consult for Heparin Infusion Dosing and Monitoring  Indication: chest pain/ACS  Allergies  Allergen Reactions  . Sulfa Antibiotics Other (See Comments)    Unknown, happened as a child    Patient Measurements: Height: 5\' 7"  (170.2 cm) Weight: 52.1 kg (114 lb 13.8 oz) IBW/kg (Calculated) : 61.6  Vital Signs: Temp: 98.7 F (37.1 C) (08/02 1205) Temp Source: Oral (08/02 1205) BP: 118/70 (08/02 1205) Pulse Rate: 67 (08/02 1205)  Labs: Recent Labs    06/11/20 1042 06/11/20 1042 06/11/20 1233 06/11/20 1515 06/11/20 1812 06/12/20 0427 06/12/20 1347  HGB 9.5*  --   --   --   --  10.2*  --   HCT 30.0*  --   --   --   --  31.3*  --   PLT 318  --   --   --   --  316  --   APTT  --   --  >160*  --   --   --   --   LABPROT  --   --  14.1  --   --   --   --   INR  --   --  1.1  --   --   --   --   HEPARINUNFRC  --   --   --   --   --  0.18* 0.18*  CREATININE 0.81  --   --   --   --  0.78  --   TROPONINIHS 2,853*   < > 2,600* 2,289* 2,383*  --   --    < > = values in this interval not displayed.    Estimated Creatinine Clearance: 43.8 mL/min (by C-G formula based on SCr of 0.78 mg/dL).   Medical History: Past Medical History:  Diagnosis Date  . Basal cell carcinoma 2021   L temple  . Chronic back pain   . Hypertension   . Scoliosis     Assessment: Pharmacy consulted for heparin infusion dosing and monitoring for 85 yo female admitted with ACS/STEMI  Troponin HS: 2853    BNP: 1621   Hgb: 9.5   Plts: 318   8/2 0427 HL 0.18  8/2 1347 HL 0.18  Goal of Therapy:  Heparin level 0.3-0.7 units/ml Monitor platelets by anticoagulation protocol: Yes   Plan:  Heparin level is subtherapeutic. Will rebolus heparin 1500 units IV x 1 and increase rate to 900 units/hr and recheck HL in 8 hours and CBC daily while on heparin.   Eleonore Chiquito, PharmD, BCPS Clinical Pharmacist 06/12/2020 2:21 PM

## 2020-06-12 NOTE — Progress Notes (Signed)
PROGRESS NOTE    [name] [name]   TTS:[id number]  DOB: 08-12-1935  PCP: Perrin Maltese, MD    DOA: 06/11/2020 LOS: 1   Brief Narrative   [name] [name] is a 85 y.o. female with medical history significant of hypertension, basal cell carcinoma, chronic back pain, scoliosis, tobacco abuse, who presented to the ED from home with progressive shortness of breath since the day before.  It was sudden in onset, started just after climbing stairs to her apartment with groceries, lasted about 10 minutes and resolved.     In the ED, afebrile, HR 99, O2 sat 93% on room air.  Troponin elevated at 2852, BNP 1621, Na 130, Hbg 9.5, normal renal function.  EKG was normal sinus rhythm with septal infarct.  Admitted to hospitalist service with cardiology consulted for further evaluation and management of NSTEMI and acute CHF.      Assessment & Plan   Principal Problem:   Acute CHF (congestive heart failure) (HCC) Active Problems:   Chronic back pain   NSTEMI (non-ST elevated myocardial infarction) (HCC)   Hyponatremia   Normocytic anemia   Tobacco abuse   HTN (hypertension)   Acute systolic CHF -presented with bilateral lower extremity edema, elevated BNP 1621, pulmonary edema on chest x-ray all consistent with acute CHF.  Clinically improved . Echo shows EF of 30 to 35% with wall motion abnormalities, grade 2 diastolic dysfunction, moderate MR.  Cardiology is consulted.  Strict I/O's and daily weights to monitor volume status.  Low-sodium diet with fluid restriction.  Cardiology recommends Entresto or ARB if BP tolerates, given reduced EF.  NSTEMI - present on admission, sudden onset dyspnea, no chest pain, troponin peaked at 2853.  Presentation consistent with ACS.  Cardiology following, taking to cath lab today.  No prior cardiac history.  EKG showed NSR with septal infarct, no St-T chagnes.  Continue heparin gtt, ASA, BB, PRN Sl nitro.  Statin after cath.  SVT - monitor on telemetry.  May be  contributing factor to her dyspnea.  Maintain K>4.0 and Mg>2.0.  Check TSH with AM labs.  No history of A-fib.  Recommend ambulatory monitoring as outpatient for further evaluation.  Essential hypertension -chronic, controlled.  Continue beta-blocker for now.  As needed hydralazine.  Hyponatremia -no admission, mild, Na 130.  Monitor BMP.    Normocytic anemia -stable with hemoglobin 9.5.  Monitor CBC.  Chronic back pain -oxycodone as needed.  Patient on admission requested Xtampza be discontinued, d/c'd.  Tobacco use - cessation strongly advised.  Nicotine patch   Patient BMI: Body mass index is 17.99 kg/m.   DVT prophylaxis:    Diet:  Diet Orders (From admission, onward)    Start     Ordered   06/11/20 1247  Diet Heart Room service appropriate? Yes; Fluid consistency: Thin; Fluid restriction: 1200 mL Fluid  Diet effective now       Question Answer Comment  Room service appropriate? Yes   Fluid consistency: Thin   Fluid restriction: 1200 mL Fluid      06/11/20 1246            Code Status: Partial Code    Subjective 06/12/20    Patient seen at bedside before going to Cath Lab today.  She is feeling much better denies chest pain or shortness of breath.  States she has been under significant stress since moving here 1 year ago to be close to family.  States she rarely sees her family really wants to move  back to [place] to be with her friends and church.  She wonders if stress caused this event.   Disposition Plan & Communication   Status is: Inpatient  Remains inpatient appropriate because:Ongoing diagnostic testing needed not appropriate for outpatient work up   Dispo: The patient is from: Home              Anticipated d/c is to: Home              Anticipated d/c date is: 2 days              Patient currently is not medically stable to d/c.     Family Communication: None at bedside, will attempt to call   Consults, Procedures, Significant Events    Consultants:   Cardiology  Procedures:   Left heart cath 8/2  Antimicrobials:   None     Objective   Vitals:   06/11/20 1957 06/12/20 0445 06/12/20 0732 06/12/20 0746  BP:  107/69  (!) 118/88  Pulse:  85 100 94  Resp:  15 16 19   Temp:  98.3 F (36.8 C)  98.6 F (37 C)  TempSrc:  Oral  Oral  SpO2: (!) 89% 94% 91% 95%  Weight:  52.1 kg    Height:        Intake/Output Summary (Last 24 hours) at 06/12/2020 0754 Last data filed at 06/12/2020 0600 Gross per 24 hour  Intake 815.3 ml  Output 602 ml  Net 213.3 ml   Filed Weights   06/11/20 1012 06/11/20 1510 06/12/20 0445  Weight: 50.8 kg 52.2 kg 52.1 kg    Physical Exam:  General exam: awake, alert, no acute distress Respiratory system: CTAB, no wheezes, rales or rhonchi, normal respiratory effort. Cardiovascular system: normal Y1/E5, RRR, systolic murmur, trace to 1+ pretibial edema.   Central nervous system: A&O x4. no gross focal neurologic deficits, normal speech Extremities: moves all, no cyanosis, normal tone Skin: dry, intact, normal temperature Psychiatry: normal mood, congruent affect, judgement and insight appear normal  Labs   Data Reviewed: I have personally reviewed following labs and imaging studies  CBC: Recent Labs  Lab 06/11/20 1042 06/12/20 0427  WBC 10.3 9.9  NEUTROABS 8.6*  --   HGB 9.5* 10.2*  HCT 30.0* 31.3*  MCV 90.6 87.9  PLT 318 631   Basic Metabolic Panel: Recent Labs  Lab 06/11/20 1042 06/12/20 0427  NA 130* 136  K 4.0 3.7  CL 95* 101  CO2 23 28  GLUCOSE 124* 119*  BUN 8 9  CREATININE 0.81 0.78  CALCIUM 8.5* 8.2*  MG  --  2.0   GFR: Estimated Creatinine Clearance: 43.8 mL/min (by C-G formula based on SCr of 0.78 mg/dL). Liver Function Tests: Recent Labs  Lab 06/11/20 1042  AST 40  ALT 20  ALKPHOS 120  BILITOT 0.9  PROT 6.5  ALBUMIN 3.8   No results for input(s): LIPASE, AMYLASE in the last 168 hours. No results for input(s): AMMONIA in the last 168  hours. Coagulation Profile: Recent Labs  Lab 06/11/20 1233  INR 1.1   Cardiac Enzymes: No results for input(s): CKTOTAL, CKMB, CKMBINDEX, TROPONINI in the last 168 hours. BNP (last 3 results) No results for input(s): PROBNP in the last 8760 hours. HbA1C: No results for input(s): HGBA1C in the last 72 hours. CBG: No results for input(s): GLUCAP in the last 168 hours. Lipid Profile: Recent Labs    06/12/20 0427  CHOL 179  HDL 67  LDLCALC 95  TRIG 84  CHOLHDL 2.7   Thyroid Function Tests: No results for input(s): TSH, T4TOTAL, FREET4, T3FREE, THYROIDAB in the last 72 hours. Anemia Panel: No results for input(s): VITAMINB12, FOLATE, FERRITIN, TIBC, IRON, RETICCTPCT in the last 72 hours. Sepsis Labs: No results for input(s): PROCALCITON, LATICACIDVEN in the last 168 hours.  Recent Results (from the past 240 hour(s))  SARS Coronavirus 2 by RT PCR (hospital order, performed in [facility] hospital lab) Nasopharyngeal Nasopharyngeal Swab     Status: None   Collection Time: 06/11/20  1:53 PM   Specimen: Nasopharyngeal Swab  Result Value Ref Range Status   SARS Coronavirus 2 NEGATIVE NEGATIVE Final    Comment: (NOTE) SARS-CoV-2 target nucleic acids are NOT DETECTED.  The SARS-CoV-2 RNA is generally detectable in upper and lower respiratory specimens during the acute phase of infection. The lowest concentration of SARS-CoV-2 viral copies this assay can detect is 250 copies / mL. A negative result does not preclude SARS-CoV-2 infection and should not be used as the sole basis for treatment or other patient management decisions.  A negative result may occur with improper specimen collection / handling, submission of specimen other than nasopharyngeal swab, presence of viral mutation(s) within the areas targeted by this assay, and inadequate number of viral copies (<250 copies / mL). A negative result must be combined with clinical observations, patient history, and  epidemiological information.  Fact Sheet for Patients:   StrictlyIdeas.no  Fact Sheet for Healthcare Providers: BankingDealers.co.za  This test is not yet approved or  cleared by the [place] FDA and has been authorized for detection and/or diagnosis of SARS-CoV-2 by FDA under an Emergency Use Authorization (EUA).  This EUA will remain in effect (meaning this test can be used) for the duration of the COVID-19 declaration under Section 564(b)(1) of the Act, 21 U.S.C. section 360bbb-3(b)(1), unless the authorization is terminated or revoked sooner.  Performed at [facility], [address], [place], [place] [address]       Imaging Studies   DG Chest 2 View  Result Date: 06/11/2020 CLINICAL DATA:  Short of breath. EXAM: CHEST - 2 VIEW COMPARISON:  None. FINDINGS: Cardiac silhouette is mildly enlarged. No mediastinal or hilar masses or evidence of adenopathy. Lungs are hyperexpanded. Bilateral pleural effusions obscure the hemidiaphragms. There is bilateral interstitial thickening. More confluent opacity is noted at the lung bases, likely atelectasis. No pneumothorax. Skeletal structures are demineralized but grossly intact. IMPRESSION: 1. Findings support congestive heart failure/pulmonary edema superimposed on chronic changes of COPD. There is mild cardiomegaly, bilateral interstitial thickening and moderate pleural effusions, with additional lung base opacity that is likely due to atelectasis. Electronically Signed   By: Lajean Manes M.D.   On: 06/11/2020 11:49     Medications   Scheduled Meds: . aspirin EC  81 mg Oral Daily  . atorvastatin  40 mg Oral Daily  . cholecalciferol  5,000 Units Oral Daily  . gabapentin  400 mg Oral QID  . ipratropium-albuterol  3 mL Nebulization Q6H  . metoprolol tartrate  12.5 mg Oral BID  . nicotine  21 mg Transdermal Daily  . sodium chloride flush  3 mL Intravenous Q12H   Continuous  Infusions: . sodium chloride    . heparin 750 Units/hr (06/12/20 0649)       LOS: 1 day    Time spent: 30 minutes    Ezekiel Slocumb, DO [facility]  06/12/2020, 7:54 AM    If 7PM-7AM, please contact night-coverage. How  to contact the [facility] Attending or Consulting provider [place] or covering provider during after hours [place], for this patient?    1. Check the care team in [facility] and look for a) attending/consulting [facility] provider listed and b) the [facility] team listed 2. Log into [email or web address] and use [place]'s universal password to access. If you do not have the password, please contact the hospital operator. 3. Locate the [facility] provider you are looking for under [facility] and page to a number that you can be directly reached. 4. If you still have difficulty reaching the provider, please page the [facility] (Director on Call) for the Hospitalists listed on amion for assistance.

## 2020-06-12 NOTE — Progress Notes (Signed)
*  PRELIMINARY RESULTS* Echocardiogram 2D Echocardiogram has been performed.  Sherrie Sport 06/12/2020, 8:35 AM

## 2020-06-12 NOTE — Progress Notes (Signed)
Progress Note  Patient Name: [name] [name] Date of Encounter: 06/12/2020  Primary Cardiologist: [facility], Dr. Fletcher Anon rounding  Subjective   Reviewed plan for cardiac catheterization later today.   Patient is still agreeable to this plan.  She denies any chest pain.  She does note significant anxiety/racing heart rate in the setting of thinking that she had lost her phone (see directly below).  Otherwise, she denies palpitations or presyncope.  She continues to note shortness of breath, though significantly improved from the shortness of breath experienced before admission.  She reports chronic lower extremity edema.  *Of note, when walking into the room, it was noted that the patient's IV was dislodged and she had been bleeding and a large quantity of blood onto the bed.  The care team was contacted with resolution of this issue.  The patient was reportedly looking for her phone when the IV dislodged.  She has found her phone.  Inpatient Medications    Scheduled Meds: . [START ON 06/13/2020] aspirin  81 mg Oral Pre-Cath  . aspirin EC  81 mg Oral Daily  . atorvastatin  40 mg Oral Daily  . cholecalciferol  5,000 Units Oral Daily  . gabapentin  400 mg Oral QID  . ipratropium-albuterol  3 mL Nebulization Q6H  . metoprolol tartrate  12.5 mg Oral BID  . nicotine  21 mg Transdermal Daily  . sodium chloride flush  3 mL Intravenous Q12H   Continuous Infusions: . sodium chloride    . sodium chloride    . [START ON 06/13/2020] sodium chloride    . heparin 750 Units/hr (06/12/20 0649)   PRN Meds: sodium chloride, sodium chloride, acetaminophen, albuterol, dextromethorphan-guaiFENesin, hydrALAZINE, nitroGLYCERIN, ondansetron (ZOFRAN) IV, oxyCODONE, sodium chloride flush, sodium chloride flush   Vital Signs    Vitals:   06/11/20 1957 06/12/20 0445 06/12/20 0732 06/12/20 0746  BP:  107/69  (!) 118/88  Pulse:  85 100 94  Resp:  15 16 19   Temp:  98.3 F (36.8 C)  98.6 F (37 C)  TempSrc:   Oral  Oral  SpO2: (!) 89% 94% 91% 95%  Weight:  52.1 kg    Height:        Intake/Output Summary (Last 24 hours) at 06/12/2020 1104 Last data filed at 06/12/2020 0600 Gross per 24 hour  Intake 815.3 ml  Output 602 ml  Net 213.3 ml   Last 3 Weights 06/12/2020 06/11/2020 06/11/2020  Weight (lbs) 114 lb 13.8 oz 115 lb 112 lb  Weight (kg) 52.1 kg 52.164 kg 50.803 kg      Telemetry    SVT, ST, NSR, PVCs and PACs - Personally Reviewed  ECG    No new tracings- Personally Reviewed  Physical Exam   GEN: Frail and elderly female, anxious in setting of losing phone and IV dislodged.   Neck: JVD difficult to ascertain due to patient position and as cannot lay down in bed due to need to change bed sheets (dislodged IV) Cardiac: irregular and tachycardic / frequent extrasystole, 1/6 systolic murmur,  + gallop. Radial pulses 1_ bilaterally Respiratory: R sided crackles, LLL with diminished breath sounds GI: Soft, nontender, non-distended  MS: 1-2+ bilateral edema, chronic skin changes, 1+ bilateral pulses; No deformity.  Neuro:  Nonfocal  Psych: Normal affect, somewhat anxious   Labs    High Sensitivity Troponin:   Recent Labs  Lab 06/11/20 1042 06/11/20 1233 06/11/20 1515 06/11/20 1812  TROPONINIHS 2,853* 2,600* 2,289* 2,383*  Cardiac EnzymesNo results for input(s): TROPONINI in the last 168 hours. No results for input(s): TROPIPOC in the last 168 hours.   Chemistry Recent Labs  Lab 06/11/20 1042 06/12/20 0427  NA 130* 136  K 4.0 3.7  CL 95* 101  CO2 23 28  GLUCOSE 124* 119*  BUN 8 9  CREATININE 0.81 0.78  CALCIUM 8.5* 8.2*  PROT 6.5  --   ALBUMIN 3.8  --   AST 40  --   ALT 20  --   ALKPHOS 120  --   BILITOT 0.9  --   GFRNONAA >60 >60  GFRAA >60 >60  ANIONGAP 12 7     Hematology Recent Labs  Lab 06/11/20 1042 06/12/20 0427 06/12/20 0829  WBC 10.3 9.9  --   RBC 3.31* 3.56* 3.39*  HGB 9.5* 10.2*  --   HCT 30.0* 31.3*  --   MCV 90.6 87.9  --   MCH 28.7  28.7  --   MCHC 31.7 32.6  --   RDW 12.4 12.7  --   PLT 318 316  --     BNP Recent Labs  Lab 06/11/20 1042  BNP 1,621.3*     DDimer No results for input(s): DDIMER in the last 168 hours.   Radiology    DG Chest 2 View  Result Date: 06/11/2020 CLINICAL DATA:  Short of breath. EXAM: CHEST - 2 VIEW COMPARISON:  None. FINDINGS: Cardiac silhouette is mildly enlarged. No mediastinal or hilar masses or evidence of adenopathy. Lungs are hyperexpanded. Bilateral pleural effusions obscure the hemidiaphragms. There is bilateral interstitial thickening. More confluent opacity is noted at the lung bases, likely atelectasis. No pneumothorax. Skeletal structures are demineralized but grossly intact. IMPRESSION: 1. Findings support congestive heart failure/pulmonary edema superimposed on chronic changes of COPD. There is mild cardiomegaly, bilateral interstitial thickening and moderate pleural effusions, with additional lung base opacity that is likely due to atelectasis. Electronically Signed   By: Lajean Manes M.D.   On: 06/11/2020 11:49    Cardiac Studies   Pending echo  Patient Profile     85 y.o. female with history of basal cell carcinoma and who is being seen today for the evaluation of non-ST elevation myocardial infarction with plan for cardiac catheterization later today.  Assessment & Plan    Non-ST elevation myocardial infarction --No current chest pain.  Reports sudden onset dyspnea, which has since improved, and which is likely her anginal equivalent.   --No previous cardiac history. --HS Tn 2853.0.  Continue to cycle until peaked and downtrending. --Initial EKG NSR with septal infarct.  No acute ST/T changes. --Presentation consistent with ACS.   --Scheduled for cardiac catheterization later today.  --Risks and benefits of cardiac catheterization have been discussed with the patient.  These include bleeding, infection, kidney damage, stroke, heart attack, death.  The patient  understands these risks and is willing to proceed. --Echo pending results.  --She remains n.p.o. in preparation for this procedure.   --Continue IV heparin, ASA, BB, PRN SL nitro. LDL 95. ALT 20. S/p cath, consider initiation of statin.  Acute CHF (EF currently unknown) --Improving SOB/DOE. Remains volume up on exam. BNP 1621. --S/p IV lasix. Daily BMET. Monitor I/Os, daily weights. Caution with fluids. --Echo pending results. If LVSF reduced, and if renal function / BP allows, consider initiation of ARB or Entresto, depending on EF.   SVT, Ectopy --Telemetry as above. Continue to monitor. Consider as contributing to her SOB/DOE. Monitor electrolytes. Check TSH. She denies  a history of Afib. Consider outpatient monitoring if ongoing ectopy / SVT to assess frequency / burden and rule out tachycardia induced CM.  HTN --BP currently controlled. Continue BB. Further recommendations pending echo.  Tobacco use --Cessation recommended.  Normocytic anemia --Daily CBC. Hgb 9.5.   For questions or updates, please contact [place] Please consult [email or web address] for contact info under        Signed, Arvil Chaco, PA-C  06/12/2020, 11:04 AM

## 2020-06-12 NOTE — Progress Notes (Signed)
Per bedside RN, patient requesting to speak with [name] [name] to make decision on heart cath procedure. Asked bedside RN if patient would remain NPO until decision is made about procedure. Notified Dr [name] [name] of patient request, MD to go speak with patient.

## 2020-06-12 NOTE — Hospital Course (Signed)
[name] [name] is a 85 y.o. female with medical history significant of hypertension, basal cell carcinoma, chronic back pain, scoliosis, tobacco abuse, who presented to the ED from home with progressive shortness of breath since the day before.  It was sudden in onset, started just after climbing stairs to her apartment with groceries, lasted about 10 minutes and resolved.     In the ED, afebrile, HR 99, O2 sat 93% on room air.  Troponin elevated at 2852, BNP 1621, Na 130, Hbg 9.5, normal renal function.  EKG was normal sinus rhythm with septal infarct.  Admitted to hospitalist service with cardiology consulted for further evaluation and management of NSTEMI and acute CHF.

## 2020-06-12 NOTE — Interval H&P Note (Signed)
History and Physical Interval Note:  06/12/2020 3:36 PM  [name] [name]  has presented today for surgery, with the diagnosis of Non-ST elevation myocardial infarction.  The various methods of treatment have been discussed with the patient and family. After consideration of risks, benefits and other options for treatment, the patient has consented to  Procedure(s): LEFT HEART CATH AND CORONARY ANGIOGRAPHY (N/A) as a surgical intervention.  The patient's history has been reviewed, patient examined, no change in status, stable for surgery.  I have reviewed the patient's chart and labs.  Questions were answered to the patient's satisfaction.     Kathlyn [place]

## 2020-06-12 NOTE — Progress Notes (Signed)
Talked with pt about the plan to have a heart catheterrization per cardiology. Pt stated not sure of conversation but that brother in law was present when talking with doctors earlier today. Informed pt that cardiology plans for an echocardiogram and that a heart catheterization is in the plan as well. Pt is Alert and Oriented x 4 and is complaining of having to have Heparin gtt and Oxygen remain in place. Explained to pt of the purpose and that it is important for health and stability at this time. Pt being compliant at this time. Will monitor pt behavior. Will reiterate to pt in the am about the treatment plan.

## 2020-06-12 NOTE — Consult Note (Signed)
ANTICOAGULATION CONSULT NOTE - Initial Consult  Pharmacy Consult for Heparin Infusion Dosing and Monitoring  Indication: chest pain/ACS  Allergies  Allergen Reactions  . Sulfa Antibiotics Other (See Comments)    Unknown, happened as a child    Patient Measurements: Height: 5\' 7"  (170.2 cm) Weight: 52.1 kg (114 lb 13.8 oz) IBW/kg (Calculated) : 61.6  Vital Signs: Temp: 98.3 F (36.8 C) (08/02 0445) Temp Source: Oral (08/02 0445) BP: 107/69 (08/02 0445) Pulse Rate: 85 (08/02 0445)  Labs: Recent Labs    06/11/20 1042 06/11/20 1042 06/11/20 1233 06/11/20 1515 06/11/20 1812 06/12/20 0427  HGB 9.5*  --   --   --   --  10.2*  HCT 30.0*  --   --   --   --  31.3*  PLT 318  --   --   --   --  316  APTT  --   --  >160*  --   --   --   LABPROT  --   --  14.1  --   --   --   INR  --   --  1.1  --   --   --   HEPARINUNFRC  --   --   --   --   --  0.18*  CREATININE 0.81  --   --   --   --  0.78  TROPONINIHS 2,853*   < > 2,600* 2,289* 2,383*  --    < > = values in this interval not displayed.    Estimated Creatinine Clearance: 43.8 mL/min (by C-G formula based on SCr of 0.78 mg/dL).   Medical History: Past Medical History:  Diagnosis Date  . Basal cell carcinoma 2021   L temple  . Chronic back pain   . Hypertension   . Scoliosis     Assessment: Pharmacy consulted for heparin infusion dosing and monitoring for 85 yo female admitted with ACS/STEMI  Troponin HS: 2853    BNP: 1621   Hgb: 9.5   Plts: 318   Goal of Therapy:  Heparin level 0.3-0.7 units/ml Monitor platelets by anticoagulation protocol: Yes   Plan:  08/02 @ 0430 HL 0.18 subtherapeutic. Will rebolus heparin 1500 units IV x 1 and increase rate to 750 units/hr and recheck HL at 1400 and continue to monitor.  Tobie Lords, PharmD, BCPS Clinical Pharmacist 06/12/2020 5:50 AM

## 2020-06-12 NOTE — Progress Notes (Signed)
Report received on pt from [place], [place]. Awaiting pt arrival to unit.

## 2020-06-12 NOTE — Progress Notes (Signed)
Pt not on Heparin gtt as reported by nurse on prior shift. Discussed with Hassan Rowan, NP and pharmacy about issue. Orders placed for pt to be placed back on Heparin gtt and bolus given as ordered.

## 2020-06-12 NOTE — Progress Notes (Signed)
Talked again with pt and pt not agreeing to have cardiac catheterization at this time. Wants to discuss with Cardiology and MD this am. Pt has some family dynamics as well with having availability for travel to and from doctor's appointments discussed in detail with patient. Explained to pt of the need to proceed with having procedure in the very near future due to lab results. Pt verbalizes understanding and wants to discuss with brother in law and doctors.

## 2020-06-13 ENCOUNTER — Encounter: Payer: Self-pay | Admitting: Cardiovascular Disease

## 2020-06-13 DIAGNOSIS — E43 Unspecified severe protein-calorie malnutrition: Secondary | ICD-10-CM

## 2020-06-13 DIAGNOSIS — I5021 Acute systolic (congestive) heart failure: Secondary | ICD-10-CM

## 2020-06-13 DIAGNOSIS — I255 Ischemic cardiomyopathy: Secondary | ICD-10-CM

## 2020-06-13 LAB — CBC
HCT: 29.6 % — ABNORMAL LOW (ref 36.0–46.0)
Hemoglobin: 9.8 g/dL — ABNORMAL LOW (ref 12.0–15.0)
MCH: 29.2 pg (ref 26.0–34.0)
MCHC: 33.1 g/dL (ref 30.0–36.0)
MCV: 88.1 fL (ref 80.0–100.0)
Platelets: 288 10*3/uL (ref 150–400)
RBC: 3.36 MIL/uL — ABNORMAL LOW (ref 3.87–5.11)
RDW: 13 % (ref 11.5–15.5)
WBC: 10.4 10*3/uL (ref 4.0–10.5)
nRBC: 0 % (ref 0.0–0.2)

## 2020-06-13 LAB — BASIC METABOLIC PANEL
Anion gap: 10 (ref 5–15)
BUN: 9 mg/dL (ref 8–23)
CO2: 26 mmol/L (ref 22–32)
Calcium: 8.4 mg/dL — ABNORMAL LOW (ref 8.9–10.3)
Chloride: 101 mmol/L (ref 98–111)
Creatinine, Ser: 0.7 mg/dL (ref 0.44–1.00)
GFR calc Af Amer: >60 mL/min (ref >60)
GFR calc non Af Amer: >60 mL/min (ref >60)
Glucose, Bld: 121 mg/dL — ABNORMAL HIGH (ref 70–99)
Potassium: 3.6 mmol/L (ref 3.5–5.1)
Sodium: 137 mmol/L (ref 135–145)

## 2020-06-13 LAB — MAGNESIUM: Magnesium: 2.1 mg/dL (ref 1.7–2.4)

## 2020-06-13 MED ORDER — POTASSIUM CHLORIDE CRYS ER 20 MEQ PO TBCR
40.0000 meq | EXTENDED_RELEASE_TABLET | Freq: Once | ORAL | Status: AC
  Administered 2020-06-13: 40 meq via ORAL
  Filled 2020-06-13: qty 2

## 2020-06-13 MED ORDER — METOPROLOL SUCCINATE ER 50 MG PO TB24
50.0000 mg | ORAL_TABLET | Freq: Every day | ORAL | Status: DC
  Administered 2020-06-13 – 2020-06-14 (×2): 50 mg via ORAL
  Filled 2020-06-13 (×2): qty 1

## 2020-06-13 MED ORDER — FUROSEMIDE 40 MG PO TABS
40.0000 mg | ORAL_TABLET | Freq: Every day | ORAL | Status: DC
  Administered 2020-06-13 – 2020-06-14 (×2): 40 mg via ORAL
  Filled 2020-06-13 (×2): qty 1

## 2020-06-13 MED ORDER — ENSURE ENLIVE PO LIQD
237.0000 mL | Freq: Two times a day (BID) | ORAL | Status: DC
  Administered 2020-06-13 – 2020-06-14 (×2): 237 mL via ORAL

## 2020-06-13 MED ORDER — ADULT MULTIVITAMIN W/MINERALS CH
1.0000 | ORAL_TABLET | Freq: Every day | ORAL | Status: DC
  Administered 2020-06-14: 1 via ORAL
  Filled 2020-06-13: qty 1

## 2020-06-13 MED ORDER — LISINOPRIL 5 MG PO TABS
5.0000 mg | ORAL_TABLET | Freq: Every day | ORAL | Status: DC
  Administered 2020-06-13 – 2020-06-14 (×2): 5 mg via ORAL
  Filled 2020-06-13 (×2): qty 1

## 2020-06-13 NOTE — Progress Notes (Signed)
Mobility Specialist - Progress Note   06/13/20 1435  Mobility  Activity Ambulated in hall  Level of Assistance Minimal assist, patient does 75% or more  Assistive Device Front wheel walker (CGA for safety precautions)  Distance Ambulated (ft) 230 ft  Mobility Response Tolerated well  Mobility performed by Mobility specialist  $Mobility charge 1 Mobility    Post-mobility: 68 HR, 137/66 BP, 99% SpO2   Pt was lying in bed on arrival with tech in room. Pt was motivated to ambulate in the halls. Pt was min assist in sit-to-stand d/t limited mobility in R arm. Pt ambulated with RW and CGA for safety precautions. Pt ambulated 230' before c/o of SOB, O2 sat at 99%. Overall, pt tolerated well. Pt left in bed with phone and call bell in reach.  Kathee Delton Mobility Specialist 06/13/20, 2:45 PM

## 2020-06-13 NOTE — Progress Notes (Signed)
Progress Note  Patient Name: [name] [name] Date of Encounter: 06/13/2020  [facility] Cardiologist: New  Subjective   No chest pain reported.  Patient still feels mildly short of breath and not back to baseline in regard to her breathing.  However, dyspnea is much better compared to admission.  No palpitations, lightheadedness, or edema.  Inpatient Medications    Scheduled Meds: . aspirin EC  81 mg Oral Daily  . atorvastatin  40 mg Oral Daily  . cholecalciferol  5,000 Units Oral Daily  . clopidogrel  75 mg Oral Q breakfast  . gabapentin  400 mg Oral QID  . ipratropium-albuterol  3 mL Nebulization BID  . metoprolol tartrate  25 mg Oral BID  . nicotine  21 mg Transdermal Daily  . sodium chloride flush  3 mL Intravenous Q12H  . sodium chloride flush  3 mL Intravenous Q12H   Continuous Infusions: . sodium chloride    . sodium chloride     PRN Meds: sodium chloride, sodium chloride, acetaminophen, albuterol, dextromethorphan-guaiFENesin, hydrALAZINE, nitroGLYCERIN, ondansetron (ZOFRAN) IV, oxyCODONE, sodium chloride flush, sodium chloride flush   Vital Signs    Vitals:   06/13/20 0440 06/13/20 0505 06/13/20 0742 06/13/20 0748  BP:  139/77 (!) 153/74   Pulse:  79 85   Resp:  18 19   Temp:  98.3 F (36.8 C) 97.6 F (36.4 C)   TempSrc:  Oral    SpO2:  100% 100% 100%  Weight: 50.2 kg     Height:        Intake/Output Summary (Last 24 hours) at 06/13/2020 0804 Last data filed at 06/13/2020 0507 Gross per 24 hour  Intake 970.6 ml  Output 400 ml  Net 570.6 ml   Last 3 Weights 06/13/2020 06/12/2020 06/12/2020  Weight (lbs) 110 lb 9.6 oz 114 lb 13.8 oz 114 lb 13.8 oz  Weight (kg) 50.168 kg 52.1 kg 52.1 kg      Telemetry    Sinus rhythm with frequent PACs and possible multifocal atrial tachycardia.  Occasional PVCs also noted.  No definite evidence of atrial fibrillation - Personally Reviewed  ECG    Sinus rhythm with frequent PACs, septal infarct, nonspecific ST segment  changes. - Personally Reviewed  Physical Exam   GEN: No acute distress.   Neck: No JVD Cardiac: RRR, no murmurs, rubs, or gallops.  Respiratory:  Mildly diminished breath sounds throughout without wheezes or crackles. GI: Soft, nontender, non-distended  MS: No edema; No deformity.  Right radial arteriotomy site covered with clean dressing.  No hematoma. Neuro:  Nonfocal  Psych: Normal affect   Labs    High Sensitivity Troponin:   Recent Labs  Lab 06/11/20 1042 06/11/20 1233 06/11/20 1515 06/11/20 1812  TROPONINIHS 2,853* 2,600* 2,289* 2,383*      Chemistry Recent Labs  Lab 06/11/20 1042 06/12/20 0427 06/13/20 0615  NA 130* 136 137  K 4.0 3.7 3.6  CL 95* 101 101  CO2 23 28 26   GLUCOSE 124* 119* 121*  BUN 8 9 9   CREATININE 0.81 0.78 0.70  CALCIUM 8.5* 8.2* 8.4*  PROT 6.5  --   --   ALBUMIN 3.8  --   --   AST 40  --   --   ALT 20  --   --   ALKPHOS 120  --   --   BILITOT 0.9  --   --   GFRNONAA >60 >60 >60  GFRAA >60 >60 >60  ANIONGAP 12 7 10  Hematology Recent Labs  Lab 06/11/20 1042 06/11/20 1042 06/12/20 0427 06/12/20 0829 06/13/20 0615  WBC 10.3  --  9.9  --  10.4  RBC 3.31*   < > 3.56* 3.39* 3.36*  HGB 9.5*  --  10.2*  --  9.8*  HCT 30.0*  --  31.3*  --  29.6*  MCV 90.6  --  87.9  --  88.1  MCH 28.7  --  28.7  --  29.2  MCHC 31.7  --  32.6  --  33.1  RDW 12.4  --  12.7  --  13.0  PLT 318  --  316  --  288   < > = values in this interval not displayed.    BNP Recent Labs  Lab 06/11/20 1042  BNP 1,621.3*     DDimer No results for input(s): DDIMER in the last 168 hours.   Radiology    DG Chest 2 View  Result Date: 06/11/2020 CLINICAL DATA:  Short of breath. EXAM: CHEST - 2 VIEW COMPARISON:  None. FINDINGS: Cardiac silhouette is mildly enlarged. No mediastinal or hilar masses or evidence of adenopathy. Lungs are hyperexpanded. Bilateral pleural effusions obscure the hemidiaphragms. There is bilateral interstitial thickening. More  confluent opacity is noted at the lung bases, likely atelectasis. No pneumothorax. Skeletal structures are demineralized but grossly intact. IMPRESSION: 1. Findings support congestive heart failure/pulmonary edema superimposed on chronic changes of COPD. There is mild cardiomegaly, bilateral interstitial thickening and moderate pleural effusions, with additional lung base opacity that is likely due to atelectasis. Electronically Signed   By: Lajean Manes M.D.   On: 06/11/2020 11:49   CARDIAC CATHETERIZATION  Result Date: 06/12/2020  A drug-eluting stent was successfully placed using a [place] [id number].  Mid LAD-1 lesion is 99% stenosed.  Post intervention, there is a 0% residual stenosis.  Mid LAD-2 lesion is 30% stenosed.  There is moderate to severe left ventricular systolic dysfunction.  The left ventricular ejection fraction is 25-35% by visual estimate.  LV Puja Caffey diastolic pressure is moderately elevated.  Prox RCA to Mid RCA lesion is 30% stenosed.  Dist RCA lesion is 30% stenosed.  1.  Severe one-vessel coronary artery disease with 99% stenosis in the mid LAD at the origin of second diagonal.  No other obstructive disease. 2.  Moderately severely reduced LV systolic function with an EF of 35% with segmental wall motion abnormalities consistent with an LAD infarct.  Moderately elevated left ventricular Jomaira Darr-diastolic pressure 3.  Successful angioplasty and drug-eluting stent placement to the mid LAD.  Second diagonal was jailed by the stent initially with loss of flow that improved after giving intracoronary nitroglycerin.  There was TIMI-3 flow at the [place]. Recommendations: Dual antiplatelet therapy for at least 1 year. Aggressive treatment of risk factors. Blood pressure was somewhat on the low side and thus an ACE inhibitor/ARB has not been started yet.   ECHOCARDIOGRAM COMPLETE  Result Date: 06/12/2020    ECHOCARDIOGRAM REPORT   Patient Name:   [name] [name] Date of Exam: 06/12/2020  Medical Rec #:  [id number]     Height:       67.0 in Accession #:    [id number]    Weight:       114.9 lb Date of Birth:  11-14-1934     BSA:          1.598 m Patient Age:    35 years      BP:  118/88 mmHg Patient Gender: F             HR:           94 bpm. Exam Location:  [facility] Procedure: 2D Echo, Cardiac Doppler and Color Doppler Indications:     CHF- acute diastolic 811.91  History:         Patient has no prior history of Echocardiogram examinations.                  Risk Factors:Hypertension.  Sonographer:     Sherrie Sport RDCS (AE) Referring Phys:  [id number] Soledad Gerlach NIU Diagnosing Phys: Kathlyn Sacramento MD IMPRESSIONS  1. Left ventricular ejection fraction, by estimation, is 30 to 35%. The left ventricle has moderately decreased function. The left ventricle demonstrates regional wall motion abnormalities (see scoring diagram/findings for description). There is mild left ventricular hypertrophy. Left ventricular diastolic parameters are consistent with Grade II diastolic dysfunction (pseudonormalization). There is severe hypokinesis of the left ventricular, mid-apical anteroseptal wall, anterior wall and inferior wall. Wall motion is suggestive of an LAD infarct vs. stress induced cardiomyopathy.  2. Right ventricular systolic function is normal. The right ventricular size is normal. There is mildly elevated pulmonary artery systolic pressure.  3. The mitral valve is normal in structure. Moderate mitral valve regurgitation. No evidence of mitral stenosis.  4. The aortic valve is normal in structure. Aortic valve regurgitation is not visualized. Mild to moderate aortic valve sclerosis/calcification is present, without any evidence of aortic stenosis. FINDINGS  Left Ventricle: Left ventricular ejection fraction, by estimation, is 30 to 35%. The left ventricle has moderately decreased function. The left ventricle demonstrates regional wall motion abnormalities. Severe hypokinesis of the left ventricular, mid-apical  anteroseptal wall, anterior wall and inferior wall. The left ventricular internal cavity size was normal in size. There is mild left ventricular hypertrophy. Left ventricular diastolic parameters are consistent with Grade II diastolic dysfunction (pseudonormalization). Right Ventricle: The right ventricular size is normal. No increase in right ventricular wall thickness. Right ventricular systolic function is normal. There is mildly elevated pulmonary artery systolic pressure. The tricuspid regurgitant velocity is 2.70  m/s, and with an assumed right atrial pressure of 10 mmHg, the estimated right ventricular systolic pressure is 95.6 mmHg. Left Atrium: Left atrial size was normal in size. Right Atrium: Right atrial size was normal in size. Pericardium: There is no evidence of pericardial effusion. Mitral Valve: The mitral valve is normal in structure. There is mild thickening of the mitral valve leaflet(s). There is moderate calcification of the mitral valve leaflet(s). Normal mobility of the mitral valve leaflets. Severe mitral annular calcification. Moderate mitral valve regurgitation. No evidence of mitral valve stenosis. Tricuspid Valve: The tricuspid valve is normal in structure. Tricuspid valve regurgitation is trivial. No evidence of tricuspid stenosis. Aortic Valve: The aortic valve is normal in structure. Aortic valve regurgitation is not visualized. Mild to moderate aortic valve sclerosis/calcification is present, without any evidence of aortic stenosis. Aortic valve mean gradient measures 3.0 mmHg. Aortic valve peak gradient measures 5.0 mmHg. Aortic valve area, by VTI measures 4.50 cm. Pulmonic Valve: The pulmonic valve was normal in structure. Pulmonic valve regurgitation is not visualized. No evidence of pulmonic stenosis. Aorta: The aortic root is normal in size and structure. Venous: The inferior vena cava was not well visualized. IAS/Shunts: No atrial level shunt detected by color flow Doppler.  Additional Comments: There is a small pleural effusion in the left lateral region.  LEFT VENTRICLE PLAX 2D LVIDd:  4.13 cm  Diastology LVIDs:         2.71 cm  LV e' lateral:   6.09 cm/s LV PW:         1.18 cm  LV E/e' lateral: 16.2 LV IVS:        1.26 cm  LV e' medial:    3.48 cm/s LVOT diam:     2.00 cm  LV E/e' medial:  28.3 LV SV:         85 LV SV Index:   53 LVOT Area:     3.14 cm  RIGHT VENTRICLE RV S prime:     16.80 cm/s TAPSE (M-mode): 2.9 cm LEFT ATRIUM           Index       RIGHT ATRIUM           Index LA diam:      3.60 cm 2.25 cm/m  RA Area:     16.40 cm LA Vol (A2C): 32.7 ml 20.47 ml/m RA Volume:   48.00 ml  30.05 ml/m LA Vol (A4C): 51.9 ml 32.49 ml/m  AORTIC VALVE                   PULMONIC VALVE AV Area (Vmax):    3.38 cm    PV Vmax:        0.71 m/s AV Area (Vmean):   3.37 cm    PV Peak grad:   2.0 mmHg AV Area (VTI):     4.50 cm    RVOT Peak grad: 4 mmHg AV Vmax:           111.50 cm/s AV Vmean:          80.900 cm/s AV VTI:            0.188 m AV Peak Grad:      5.0 mmHg AV Mean Grad:      3.0 mmHg LVOT Vmax:         120.00 cm/s LVOT Vmean:        86.900 cm/s LVOT VTI:          0.269 m LVOT/AV VTI ratio: 1.43  AORTA Ao Root diam: 2.50 cm MITRAL VALVE               TRICUSPID VALVE MV Area (PHT): 5.27 cm    TR Peak grad:   29.2 mmHg MV Decel Time: 144 msec    TR Vmax:        270.00 cm/s MV E velocity: 98.50 cm/s MV A velocity: 84.90 cm/s  SHUNTS MV E/A ratio:  1.16        Systemic VTI:  0.27 m                            Systemic Diam: 2.00 cm Kathlyn Sacramento MD Electronically signed by Kathlyn Sacramento MD Signature Date/Time: 06/12/2020/12:00:48 PM    Final     Cardiac Studies   LHC/PCI (06/12/2020): 1.  Severe one-vessel coronary artery disease with 99% stenosis in the mid LAD at the origin of second diagonal.  No other obstructive disease. 2.  Moderately severely reduced LV systolic function with an EF of 35% with segmental wall motion abnormalities consistent with an LAD infarct.   Moderately elevated left ventricular Laquita Harlan-diastolic pressure 3.  Successful angioplasty and drug-eluting stent placement to the mid LAD.  Second diagonal was jailed by the stent initially with loss of flow that improved  after giving intracoronary nitroglycerin.  There was TIMI-3 flow at the Charell Faulk.  Echocardiogram (06/12/2020): 1. Left ventricular ejection fraction, by estimation, is 30 to 35%. The  left ventricle has moderately decreased function. The left ventricle  demonstrates regional wall motion abnormalities (see scoring  diagram/findings for description). There is mild  left ventricular hypertrophy. Left ventricular diastolic parameters are  consistent with Grade II diastolic dysfunction (pseudonormalization).  There is severe hypokinesis of the left ventricular, mid-apical  anteroseptal wall, anterior wall and inferior  wall. Wall motion is suggestive of an LAD infarct vs. stress induced  cardiomyopathy.  2. Right ventricular systolic function is normal. The right ventricular  size is normal. There is mildly elevated pulmonary artery systolic  pressure.  3. The mitral valve is normal in structure. Moderate mitral valve  regurgitation. No evidence of mitral stenosis.  4. The aortic valve is normal in structure. Aortic valve regurgitation is  not visualized. Mild to moderate aortic valve sclerosis/calcification is  present, without any evidence of aortic stenosis.   Patient Profile     85 y.o. female of basal cell carcinoma and tobacco abuse, admitted with NSTEMI and acute HFrEF.  Assessment & Plan    NSTEMI: No further chest pain.  Catheterization yesterday showed severe single-vessel coronary artery disease with high-grade stenosis of the mid LAD.  Patient is now status post PCI.  Continue dual antiplatelet therapy with aspirin and clopidogrel for at least 12 months.  Switch metoprolol tartrate to metoprolol succinate 50 mg daily.  Continue atorvastatin 40 mg  daily.  Cardiac rehab after discharge.  Acute HFrEF with ischemic cardiomyopathy: LVEF noted to be 30-35% by echo with anterior wall motion abnormality.  Patient is status post PCI to mid LAD.  She was noted to have moderately elevated left ventricular filling pressure and and is still requiring 2 L of supplemental oxygen.  Initiate furosemide 40 mg p.o. daily.  Transition metoprolol tartrate to metoprolol succinate 50 mg daily.  Add lisinopril 5 mg daily.  Based on renal function and electrolytes, consider adding aldosterone antagonist in the coming days or an outpatient follow-up.  Tobacco abuse:  Continue to encourage smoking cessation.  Nicotine patch in place.  Disposition: Ideally, we would keep the patient hospitalized for at least 1 more day to optimize her evidence-based heart failure therapy and improve her volume status, given that she still requires supplemental oxygen.  She is very eager to be discharged home.  I think it would be reasonable to initiate heart failure therapy, as above, and assess her symptoms and oxygenation with ambulation later today.  If she is discharged home today, she will need to follow-up in our office in the next 1 to 2 weeks.  For questions or updates, please contact [place] Please consult [email or web address] for contact info under [facility].     Signed, Nelva Bush, MD  06/13/2020, 8:04 AM

## 2020-06-13 NOTE — Progress Notes (Signed)
Mobility Specialist - Progress Note   06/13/20 1225  Mobility  Activity Ambulated in room;Transferred:  Bed to chair  Level of Assistance Minimal assist, patient does 75% or more  Assistive Device Front wheel walker (CGA for safety precautions)  Distance Ambulated (ft) 40 ft  Mobility Response Tolerated well  Mobility performed by Mobility specialist  $Mobility charge 1 Mobility    Pre-mobility: 81 HR, 141/76 BP, 97% SpO2 Post-mobility: 97 HR, 141/91 BP, 96% SpO2   Pt was lying in bed on arrival and agreed to session. Pt c/o soreness in her R arm (non-weight bearing) where her procedure had taken place the day prior. Pt was minimal assist in sit-to-stand d/t limited mobility in R arm. Pt ambulated in room 40' in total with a RW and CGA for safety precautions. Pt is motivated to ambulate as often as possible to help her get better. Pt ambulated to the recliner and was left there with chair alarm set and phone/call bell in reach. Nurse was notified of performance.    Kathee Delton Mobility Specialist 06/13/20, 12:36 PM

## 2020-06-13 NOTE — Progress Notes (Signed)
Initial Nutrition Assessment  DOCUMENTATION CODES:   Severe malnutrition in context of chronic illness  INTERVENTION:   Ensure Enlive po BID, each supplement provides 350 kcal and 20 grams of protein  MVI daily   NUTRITION DIAGNOSIS:   Severe Malnutrition related to chronic illness (CHF, advanced age) as evidenced by severe fat depletion, severe muscle depletion.  GOAL:   Patient will meet greater than or equal to 90% of their needs  MONITOR:   PO intake, Supplement acceptance, Labs, Weight trends, Skin, I & O's  REASON FOR ASSESSMENT:   Other (Comment) (Low BMI)    ASSESSMENT:   85 y.o. female with medical history significant of hypertension, basal cell carcinoma, chronic back pain, scoliosis, tobacco abuse, who presents with shortness of breath.   Met with pt in room today. Pt reports good appetite and oral intake at baseline. Pt reports that she eats mostly fruit at home; pt does report that she drinks chocolate Ensure daily at home. Pt reports eating 80% of her breakfast this morning. RD will add supplements and MVI to help pt meet her estimated needs. Per chart, pt appears weight stable at baseline.   Medications reviewed and include: aspirin, D3, plavix, lasix, nicotine  Labs reviewed: Hgb 9.8(L), Hct 29.6(L)  NUTRITION - FOCUSED PHYSICAL EXAM:    Most Recent Value  Orbital Region Moderate depletion  Upper Arm Region Severe depletion  Thoracic and Lumbar Region Severe depletion  Buccal Region Moderate depletion  Temple Region Moderate depletion  Clavicle Bone Region Severe depletion  Clavicle and Acromion Bone Region Severe depletion  Scapular Bone Region Severe depletion  Dorsal Hand Severe depletion  Patellar Region Severe depletion  Anterior Thigh Region Severe depletion  Posterior Calf Region Severe depletion  Edema (RD Assessment) Mild  Hair Reviewed  Eyes Reviewed  Mouth Reviewed  Skin Reviewed  Nails Reviewed     Diet Order:   Diet Order             Diet Heart Room service appropriate? Yes; Fluid consistency: Thin  Diet effective now                EDUCATION NEEDS:   Education needs have been addressed  Skin:  Skin Assessment: Reviewed RN Assessment (ecchymosis)  Last BM:  8/2- TYPE 4  Height:   Ht Readings from Last 1 Encounters:  06/12/20 _0  (1.702 m)    Weight:   Wt Readings from Last 1 Encounters:  06/13/20 50.2 kg    Ideal Body Weight:  61.36 kg  BMI:  Body mass index is 17.32 kg/m.  Estimated Nutritional Needs:   Kcal:  1400-1600kcal/day  Protein:  70-80g/day  Fluid:  >1.4L/day  Koleen Distance MS, RD, LDN Please refer to [facility] for RD and/or RD on-call/weekend/after hours pager

## 2020-06-13 NOTE — Progress Notes (Signed)
PROGRESS NOTE    Kalya Troeger   PJA:[id number]  DOB: Dec 02, 1934  PCP: Mechele Claude, FNP    DOA: 06/11/2020 LOS: 2   Brief Narrative   [name] [name] is a 85 y.o. female with medical history significant of hypertension, basal cell carcinoma, chronic back pain, scoliosis, tobacco abuse, who presented to the ED from home with progressive shortness of breath since the day before.  It was sudden in onset, started just after climbing stairs to her apartment with groceries, lasted about 10 minutes and resolved.     In the ED, afebrile, HR 99, O2 sat 93% on room air.  Troponin elevated at 2852, BNP 1621, Na 130, Hbg 9.5, normal renal function.  EKG was normal sinus rhythm with septal infarct.  Admitted to hospitalist service with cardiology consulted for further evaluation and management of NSTEMI and acute CHF.      Assessment & Plan   Principal Problem:   Acute CHF (congestive heart failure) (HCC) Active Problems:   Chronic back pain   NSTEMI (non-ST elevated myocardial infarction) (HCC)   Hyponatremia   Normocytic anemia   Tobacco abuse   HTN (hypertension)   Protein-calorie malnutrition, severe   Acute systolic CHF -presented with bilateral lower extremity edema, elevated BNP 1621, pulmonary edema on chest x-ray all consistent with acute CHF.  Clinically improved . Echo shows EF of 30 to 35% with wall motion abnormalities, grade 2 diastolic dysfunction, moderate MR.  Cardiology is consulted.  Strict I/O's and daily weights to monitor volume status.  Low-sodium diet with fluid restriction.  Cardiology recommends Entresto or ARB if BP tolerates, given reduced EF.  NSTEMI - present on admission, sudden onset dyspnea, no chest pain, troponin peaked at 2853.  Presentation consistent with ACS.  Cardiology following.  No prior cardiac history.  EKG showed NSR with septal infarct, no St-T chagnes.   Underwent left heart cath on 8/2, stent placed in mid LAD. Continue ASA, BB, statin, PRN  Sl nitro.   Monitor on new medications overnight, and likely d/c tomorrow if tolerating and cardiology clears her.  SVT - monitor on telemetry.  May be contributing factor to her dyspnea.  Maintain K>4.0 and Mg>2.0.  Check TSH with AM labs.  No history of A-fib.  Consider ambulatory monitoring as outpatient for further evaluation.  Follow up with cardiology.  Essential hypertension -chronic, controlled.  Continue beta-blocker for now.  As needed hydralazine.  Hyponatremia -no admission, mild, Na 130.  Monitor BMP.    Normocytic anemia -stable with hemoglobin 9.5.  Monitor CBC.  Chronic back pain -oxycodone as needed.  Patient on admission requested Xtampza be discontinued, d/c'd.  Tobacco use - cessation strongly advised.  Nicotine patch   Patient BMI: Body mass index is 17.32 kg/m.   DVT prophylaxis:    Diet:  Diet Orders (From admission, onward)    Start     Ordered   06/12/20 1705  Diet Heart Room service appropriate? Yes; Fluid consistency: Thin  Diet effective now       Question Answer Comment  Room service appropriate? Yes   Fluid consistency: Thin      06/12/20 1705            Code Status: Partial Code    Subjective 06/13/20    Patient seen at up in chair after walking with PT today.  Said she felt good, but got slightly short of breath.  Denies any chest pain.  She was hoping to go home today, but  agreeable to stay for monitoring on new meds.  No other complaints or acute events.   Disposition Plan & Communication   Status is: Inpatient  Remains inpatient appropriate because: requires monitoring overnight to ensure tolerating cardiac medications with stable BP   Dispo: The patient is from: Home              Anticipated d/c is to: Home              Anticipated d/c date is: 2 days              Patient currently is not medically stable to d/c.     Family Communication: None at bedside, will attempt to call   Consults, Procedures, Significant Events    Consultants:   Cardiology  Procedures:   Left heart cath 8/2  Antimicrobials:   None     Objective   Vitals:   06/13/20 0505 06/13/20 0742 06/13/20 0748 06/13/20 1110  BP: 139/77 (!) 153/74  131/87  Pulse: 79 85  74  Resp: 18 19  18   Temp: 98.3 F (36.8 C) 97.6 F (36.4 C)  97.6 F (36.4 C)  TempSrc: Oral     SpO2: 100% 100% 100% 99%  Weight:      Height:        Intake/Output Summary (Last 24 hours) at 06/13/2020 1408 Last data filed at 06/13/2020 1330 Gross per 24 hour  Intake 1340 ml  Output 850 ml  Net 490 ml   Filed Weights   06/12/20 0445 06/12/20 1447 06/13/20 0440  Weight: 52.1 kg 52.1 kg 50.2 kg    Physical Exam:  General exam: awake, alert, no acute distress Respiratory system: CTAB, no wheezes, rales or rhonchi, normal respiratory effort. Cardiovascular system: normal J1/B1, RRR, systolic murmur, trace edema.   Central nervous system: A&O x4. no gross focal neurologic deficits, normal speech Extremities: moves all, no cyanosis, normal tone   Labs   Data Reviewed: I have personally reviewed following labs and imaging studies  CBC: Recent Labs  Lab 06/11/20 1042 06/12/20 0427 06/13/20 0615  WBC 10.3 9.9 10.4  NEUTROABS 8.6*  --   --   HGB 9.5* 10.2* 9.8*  HCT 30.0* 31.3* 29.6*  MCV 90.6 87.9 88.1  PLT 318 316 478   Basic Metabolic Panel: Recent Labs  Lab 06/11/20 1042 06/12/20 0427 06/13/20 0615  NA 130* 136 137  K 4.0 3.7 3.6  CL 95* 101 101  CO2 23 28 26   GLUCOSE 124* 119* 121*  BUN 8 9 9   CREATININE 0.81 0.78 0.70  CALCIUM 8.5* 8.2* 8.4*  MG  --  2.0 2.1   GFR: Estimated Creatinine Clearance: 42.2 mL/min (by C-G formula based on SCr of 0.7 mg/dL). Liver Function Tests: Recent Labs  Lab 06/11/20 1042  AST 40  ALT 20  ALKPHOS 120  BILITOT 0.9  PROT 6.5  ALBUMIN 3.8   No results for input(s): LIPASE, AMYLASE in the last 168 hours. No results for input(s): AMMONIA in the last 168 hours. Coagulation  Profile: Recent Labs  Lab 06/11/20 1233  INR 1.1   Cardiac Enzymes: No results for input(s): CKTOTAL, CKMB, CKMBINDEX, TROPONINI in the last 168 hours. BNP (last 3 results) No results for input(s): PROBNP in the last 8760 hours. HbA1C: Recent Labs    06/12/20 0427  HGBA1C 5.5   CBG: No results for input(s): GLUCAP in the last 168 hours. Lipid Profile: Recent Labs    06/12/20 0427  CHOL 179  HDL 67  LDLCALC 95  TRIG 84  CHOLHDL 2.7   Thyroid Function Tests: No results for input(s): TSH, T4TOTAL, FREET4, T3FREE, THYROIDAB in the last 72 hours. Anemia Panel: Recent Labs    06/12/20 0829  VITAMINB12 228  FOLATE 17.3  FERRITIN 21  TIBC 335  IRON 20*  RETICCTPCT 1.5   Sepsis Labs: No results for input(s): PROCALCITON, LATICACIDVEN in the last 168 hours.  Recent Results (from the past 240 hour(s))  SARS Coronavirus 2 by RT PCR (hospital order, performed in [facility] hospital lab) Nasopharyngeal Nasopharyngeal Swab     Status: None   Collection Time: 06/11/20  1:53 PM   Specimen: Nasopharyngeal Swab  Result Value Ref Range Status   SARS Coronavirus 2 NEGATIVE NEGATIVE Final    Comment: (NOTE) SARS-CoV-2 target nucleic acids are NOT DETECTED.  The SARS-CoV-2 RNA is generally detectable in upper and lower respiratory specimens during the acute phase of infection. The lowest concentration of SARS-CoV-2 viral copies this assay can detect is 250 copies / mL. A negative result does not preclude SARS-CoV-2 infection and should not be used as the sole basis for treatment or other patient management decisions.  A negative result may occur with improper specimen collection / handling, submission of specimen other than nasopharyngeal swab, presence of viral mutation(s) within the areas targeted by this assay, and inadequate number of viral copies (<250 copies / mL). A negative result must be combined with clinical observations, patient history, and epidemiological  information.  Fact Sheet for Patients:   StrictlyIdeas.no  Fact Sheet for Healthcare Providers: BankingDealers.co.za  This test is not yet approved or  cleared by the [place] FDA and has been authorized for detection and/or diagnosis of SARS-CoV-2 by FDA under an Emergency Use Authorization (EUA).  This EUA will remain in effect (meaning this test can be used) for the duration of the COVID-19 declaration under Section 564(b)(1) of the Act, 21 U.S.C. section 360bbb-3(b)(1), unless the authorization is terminated or revoked sooner.  Performed at [facility], [address] [place], [place] [address]       Imaging Studies   CARDIAC CATHETERIZATION  Result Date: 06/12/2020  A drug-eluting stent was successfully placed using a [place] [id number].  Mid LAD-1 lesion is 99% stenosed.  Post intervention, there is a 0% residual stenosis.  Mid LAD-2 lesion is 30% stenosed.  There is moderate to severe left ventricular systolic dysfunction.  The left ventricular ejection fraction is 25-35% by visual estimate.  LV end diastolic pressure is moderately elevated.  Prox RCA to Mid RCA lesion is 30% stenosed.  Dist RCA lesion is 30% stenosed.  1.  Severe one-vessel coronary artery disease with 99% stenosis in the mid LAD at the origin of second diagonal.  No other obstructive disease. 2.  Moderately severely reduced LV systolic function with an EF of 35% with segmental wall motion abnormalities consistent with an LAD infarct.  Moderately elevated left ventricular end-diastolic pressure 3.  Successful angioplasty and drug-eluting stent placement to the mid LAD.  Second diagonal was jailed by the stent initially with loss of flow that improved after giving intracoronary nitroglycerin.  There was TIMI-3 flow at the end. Recommendations: Dual antiplatelet therapy for at least 1 year. Aggressive treatment of risk factors. Blood  pressure was somewhat on the low side and thus an ACE inhibitor/ARB has not been started yet.   ECHOCARDIOGRAM COMPLETE  Result Date: 06/12/2020    ECHOCARDIOGRAM REPORT   Patient Name:  [name] [name] Date of Exam: 06/12/2020 Medical Rec #:  [id number]     Height:       67.0 in Accession #:    [id number]    Weight:       114.9 lb Date of Birth:  January 07, 1935     BSA:          1.598 m Patient Age:    43 years      BP:           118/88 mmHg Patient Gender: F             HR:           94 bpm. Exam Location:  [facility] Procedure: 2D Echo, Cardiac Doppler and Color Doppler Indications:     CHF- acute diastolic 371.06  History:         Patient has no prior history of Echocardiogram examinations.                  Risk Factors:Hypertension.  Sonographer:     Sherrie Sport RDCS (AE) Referring Phys:  [id number] Soledad Gerlach NIU Diagnosing Phys: Kathlyn Sacramento MD IMPRESSIONS  1. Left ventricular ejection fraction, by estimation, is 30 to 35%. The left ventricle has moderately decreased function. The left ventricle demonstrates regional wall motion abnormalities (see scoring diagram/findings for description). There is mild left ventricular hypertrophy. Left ventricular diastolic parameters are consistent with Grade II diastolic dysfunction (pseudonormalization). There is severe hypokinesis of the left ventricular, mid-apical anteroseptal wall, anterior wall and inferior wall. Wall motion is suggestive of an LAD infarct vs. stress induced cardiomyopathy.  2. Right ventricular systolic function is normal. The right ventricular size is normal. There is mildly elevated pulmonary artery systolic pressure.  3. The mitral valve is normal in structure. Moderate mitral valve regurgitation. No evidence of mitral stenosis.  4. The aortic valve is normal in structure. Aortic valve regurgitation is not visualized. Mild to moderate aortic valve sclerosis/calcification is present, without any evidence of aortic stenosis. FINDINGS  Left Ventricle: Left ventricular  ejection fraction, by estimation, is 30 to 35%. The left ventricle has moderately decreased function. The left ventricle demonstrates regional wall motion abnormalities. Severe hypokinesis of the left ventricular, mid-apical anteroseptal wall, anterior wall and inferior wall. The left ventricular internal cavity size was normal in size. There is mild left ventricular hypertrophy. Left ventricular diastolic parameters are consistent with Grade II diastolic dysfunction (pseudonormalization). Right Ventricle: The right ventricular size is normal. No increase in right ventricular wall thickness. Right ventricular systolic function is normal. There is mildly elevated pulmonary artery systolic pressure. The tricuspid regurgitant velocity is 2.70  m/s, and with an assumed right atrial pressure of 10 mmHg, the estimated right ventricular systolic pressure is 85.4 mmHg. Left Atrium: Left atrial size was normal in size. Right Atrium: Right atrial size was normal in size. Pericardium: There is no evidence of pericardial effusion. Mitral Valve: The mitral valve is normal in structure. There is mild thickening of the mitral valve leaflet(s). There is moderate calcification of the mitral valve leaflet(s). Normal mobility of the mitral valve leaflets. Severe mitral annular calcification. Moderate mitral valve regurgitation. No evidence of mitral valve stenosis. Tricuspid Valve: The tricuspid valve is normal in structure. Tricuspid valve regurgitation is trivial. No evidence of tricuspid stenosis. Aortic Valve: The aortic valve is normal in structure. Aortic valve regurgitation is not visualized. Mild to moderate aortic valve sclerosis/calcification is present, without any evidence of aortic stenosis. Aortic valve mean gradient measures 3.0 mmHg.  Aortic valve peak gradient measures 5.0 mmHg. Aortic valve area, by VTI measures 4.50 cm. Pulmonic Valve: The pulmonic valve was normal in structure. Pulmonic valve regurgitation is not  visualized. No evidence of pulmonic stenosis. Aorta: The aortic root is normal in size and structure. Venous: The inferior vena cava was not well visualized. IAS/Shunts: No atrial level shunt detected by color flow Doppler. Additional Comments: There is a small pleural effusion in the left lateral region.  LEFT VENTRICLE PLAX 2D LVIDd:         4.13 cm  Diastology LVIDs:         2.71 cm  LV e' lateral:   6.09 cm/s LV PW:         1.18 cm  LV E/e' lateral: 16.2 LV IVS:        1.26 cm  LV e' medial:    3.48 cm/s LVOT diam:     2.00 cm  LV E/e' medial:  28.3 LV SV:         85 LV SV Index:   53 LVOT Area:     3.14 cm  RIGHT VENTRICLE RV S prime:     16.80 cm/s TAPSE (M-mode): 2.9 cm LEFT ATRIUM           Index       RIGHT ATRIUM           Index LA diam:      3.60 cm 2.25 cm/m  RA Area:     16.40 cm LA Vol (A2C): 32.7 ml 20.47 ml/m RA Volume:   48.00 ml  30.05 ml/m LA Vol (A4C): 51.9 ml 32.49 ml/m  AORTIC VALVE                   PULMONIC VALVE AV Area (Vmax):    3.38 cm    PV Vmax:        0.71 m/s AV Area (Vmean):   3.37 cm    PV Peak grad:   2.0 mmHg AV Area (VTI):     4.50 cm    RVOT Peak grad: 4 mmHg AV Vmax:           111.50 cm/s AV Vmean:          80.900 cm/s AV VTI:            0.188 m AV Peak Grad:      5.0 mmHg AV Mean Grad:      3.0 mmHg LVOT Vmax:         120.00 cm/s LVOT Vmean:        86.900 cm/s LVOT VTI:          0.269 m LVOT/AV VTI ratio: 1.43  AORTA Ao Root diam: 2.50 cm MITRAL VALVE               TRICUSPID VALVE MV Area (PHT): 5.27 cm    TR Peak grad:   29.2 mmHg MV Decel Time: 144 msec    TR Vmax:        270.00 cm/s MV E velocity: 98.50 cm/s MV A velocity: 84.90 cm/s  SHUNTS MV E/A ratio:  1.16        Systemic VTI:  0.27 m                            Systemic Diam: 2.00 cm Kathlyn Sacramento MD Electronically signed by Kathlyn Sacramento MD Signature Date/Time: 06/12/2020/12:00:48 PM    Final  Medications   Scheduled Meds: . aspirin EC  81 mg Oral Daily  . atorvastatin  40 mg Oral Daily  .  cholecalciferol  5,000 Units Oral Daily  . clopidogrel  75 mg Oral Q breakfast  . feeding supplement (ENSURE ENLIVE)  237 mL Oral BID BM  . furosemide  40 mg Oral Daily  . gabapentin  400 mg Oral QID  . ipratropium-albuterol  3 mL Nebulization BID  . lisinopril  5 mg Oral Daily  . metoprolol succinate  50 mg Oral Daily  . [START ON 06/14/2020] multivitamin with minerals  1 tablet Oral Daily  . nicotine  21 mg Transdermal Daily  . sodium chloride flush  3 mL Intravenous Q12H  . sodium chloride flush  3 mL Intravenous Q12H   Continuous Infusions: . sodium chloride    . sodium chloride         LOS: 2 days    Time spent: 20 minutes    Ezekiel Slocumb, DO [facility]  06/13/2020, 2:08 PM    If 7PM-7AM, please contact night-coverage. How to contact the [facility] Attending or Consulting provider [place] or covering provider during after hours [place], for this patient?    1. Check the care team in [facility] and look for a) attending/consulting [facility] provider listed and b) the [facility] team listed 2. Log into [email or web address] and use 's universal password to access. If you do not have the password, please contact the hospital operator. 3. Locate the [facility] provider you are looking for under [facility] and page to a number that you can be directly reached. 4. If you still have difficulty reaching the provider, please page the [facility] (Director on Call) for the Hospitalists listed on amion for assistance.

## 2020-06-14 DIAGNOSIS — D509 Iron deficiency anemia, unspecified: Secondary | ICD-10-CM

## 2020-06-14 DIAGNOSIS — M549 Dorsalgia, unspecified: Secondary | ICD-10-CM

## 2020-06-14 DIAGNOSIS — I471 Supraventricular tachycardia: Secondary | ICD-10-CM

## 2020-06-14 DIAGNOSIS — I5021 Acute systolic (congestive) heart failure: Secondary | ICD-10-CM

## 2020-06-14 DIAGNOSIS — G8929 Other chronic pain: Secondary | ICD-10-CM

## 2020-06-14 LAB — BASIC METABOLIC PANEL
Anion gap: 9 (ref 5–15)
BUN: 17 mg/dL (ref 8–23)
CO2: 27 mmol/L (ref 22–32)
Calcium: 8.3 mg/dL — ABNORMAL LOW (ref 8.9–10.3)
Chloride: 100 mmol/L (ref 98–111)
Creatinine, Ser: 0.85 mg/dL (ref 0.44–1.00)
GFR calc Af Amer: >60 mL/min (ref >60)
GFR calc non Af Amer: >60 mL/min (ref >60)
Glucose, Bld: 149 mg/dL — ABNORMAL HIGH (ref 70–99)
Potassium: 3.7 mmol/L (ref 3.5–5.1)
Sodium: 136 mmol/L (ref 135–145)

## 2020-06-14 MED ORDER — METOPROLOL SUCCINATE ER 50 MG PO TB24: 50.0000 mg | ORAL_TABLET | Freq: Every day | ORAL | 0 refills | Status: DC

## 2020-06-14 MED ORDER — CLOPIDOGREL BISULFATE 75 MG PO TABS: 75.0000 mg | ORAL_TABLET | Freq: Every day | ORAL | 0 refills | Status: DC

## 2020-06-14 MED ORDER — NITROGLYCERIN 0.4 MG SL SUBL: 0.4000 mg | SUBLINGUAL_TABLET | SUBLINGUAL | 0 refills | Status: DC | PRN

## 2020-06-14 MED ORDER — FUROSEMIDE 20 MG PO TABS: 20.0000 mg | ORAL_TABLET | Freq: Every day | ORAL | 0 refills | Status: DC

## 2020-06-14 MED ORDER — ENSURE ENLIVE PO LIQD: 237.0000 mL | Freq: Two times a day (BID) | ORAL | 0 refills | Status: DC

## 2020-06-14 MED ORDER — ATORVASTATIN CALCIUM 40 MG PO TABS: 40.0000 mg | ORAL_TABLET | Freq: Every day | ORAL | 0 refills | Status: DC

## 2020-06-14 MED ORDER — NICOTINE 21 MG/24HR TD PT24: MEDICATED_PATCH | TRANSDERMAL | 0 refills | Status: DC

## 2020-06-14 MED ORDER — LISINOPRIL 5 MG PO TABS: 5.0000 mg | ORAL_TABLET | Freq: Every day | ORAL | 0 refills | Status: DC

## 2020-06-14 NOTE — Progress Notes (Signed)
Mobility Specialist - Progress Note   06/14/20 1058  Mobility  Activity Refused mobility  Mobility performed by Mobility specialist    Pt refused mobility prior to discharging home. Reviewed w/ pt on mobilizing safetly at home. Pt understands. Pt states she is comfortable mobilizing/ambulating room air. Pt states she is "ready to go home".    Krystina Strieter Mobility Specialist  06/14/20, 11:00 AM

## 2020-06-14 NOTE — Progress Notes (Signed)
Discharge instructions explained to pt / verbalized an understanding/ IVs and tele removed/ will transport off unit via wheelchair.

## 2020-06-14 NOTE — Discharge Summary (Signed)
[place] [place] NAME: [name] [name]    MR#:  [id number]  DATE OF BIRTH:  July 15, 1935  DATE OF ADMISSION:  06/11/2020 ADMITTING PHYSICIAN: Ivor Costa, MD  DATE OF DISCHARGE: 06/14/2020 12:26 PM  PRIMARY CARE PHYSICIAN: Mechele Claude, FNP    ADMISSION DIAGNOSIS:  Shortness of breath [R06.02] Chronic pain syndrome [G89.4] Elevated troponin [R77.8] Left arm pain [M79.602] NSTEMI (non-ST elevated myocardial infarction) (HCC) [I21.4] Acute CHF (congestive heart failure) (HCC) [I50.9]  DISCHARGE DIAGNOSIS:  Principal Problem:   Acute CHF (congestive heart failure) (HCC) Active Problems:   Chronic back pain   NSTEMI (non-ST elevated myocardial infarction) (HCC)   Hyponatremia   Normocytic anemia   Tobacco abuse   HTN (hypertension)   Protein-calorie malnutrition, severe   SECONDARY DIAGNOSIS:   Past Medical History:  Diagnosis Date  . Atrial fibrillation ([place])   . Basal cell carcinoma 2021   L temple  . Chronic back pain   . Congestive heart failure (CHF) ([place])   . Hypertension   . Scoliosis     HOSPITAL COURSE:   1.  NSTEMI.  Patient came in with shortness of breath.  Troponin peaked at 2853.  On 06/12/2020 patient had a cardiac catheterization and a stent was placed in the mid LAD.  Patient on aspirin, Toprol, Lipitor, sublingual nitroglycerin as needed and Plavix.  Patient feeling well and no complaints of shortness of breath or chest pain.  Acute cardiac rehab.  LDL 95. 2.  Acute systolic congestive heart failure.  The patient was diuresed with IV Lasix.  Patient's lungs are clear upon discharge home.  Patient is on Toprol, lisinopril, Lasix.  With blood pressure being on the lower side can consider starting Aldactone as outpatient.  Recheck echocardiogram as outpatient.  Sometimes with myocardial infarction EF can be lower initially. 3.  SVT.  Follow-up with cardiology as outpatient on Toprol. 4.  Essential hypertension.   On low-dose lisinopril Lasix and Toprol.  Monitor as outpatient 5.  Hyponatremia on presentation with a sodium 130.  This has normalized. 6.  Iron deficiency anemia.  Watch counts as outpatient.  B12 level on the low normal side at 228. 7.  Tobacco abuse.  Nicotine patch prescribed 8.  Chronic back pain.  DISCHARGE CONDITIONS:  Satisfactory  CONSULTS OBTAINED:  Treatment Team:  Minna Merritts, MD  DRUG ALLERGIES:   Allergies  Allergen Reactions  . Sulfa Antibiotics Other (See Comments)    Unknown, happened as a child    DISCHARGE MEDICATIONS:   Allergies as of 06/14/2020      Reactions   Sulfa Antibiotics Other (See Comments)   Unknown, happened as a child      Medication List    STOP taking these medications   NIFEdipine 60 MG 24 hr tablet Commonly known as: ADALAT CC     TAKE these medications   aspirin 81 MG chewable tablet Chew 81 mg by mouth daily.   atorvastatin 40 MG tablet Commonly known as: LIPITOR Take 1 tablet (40 mg total) by mouth daily. Start taking on: June 15, 2020   clopidogrel 75 MG tablet Commonly known as: PLAVIX Take 1 tablet (75 mg total) by mouth daily with breakfast. Start taking on: June 15, 2020   feeding supplement (ENSURE ENLIVE) Liqd Take 237 mLs by mouth 2 (two) times daily between meals.   furosemide 20 MG tablet Commonly known as: LASIX Take 1 tablet (20 mg total) by mouth daily. Start  taking on: June 15, 2020   gabapentin 400 MG capsule Commonly known as: NEURONTIN Take 400 mg by mouth 4 (four) times daily.   ICAPS AREDS 2 PO Take 1 tablet by mouth daily.   lisinopril 5 MG tablet Commonly known as: ZESTRIL Take 1 tablet (5 mg total) by mouth daily. Start taking on: June 15, 2020   metoprolol succinate 50 MG 24 hr tablet Commonly known as: TOPROL-XL Take 1 tablet (50 mg total) by mouth daily. Take with or immediately following a meal. Start taking on: June 15, 2020   nicotine 21 mg/24hr patch Commonly  known as: NICODERM CQ - dosed in mg/24 hours 21mg  patch chest wall daily (okay to substitute generic)   nitroGLYCERIN 0.4 MG SL tablet Commonly known as: NITROSTAT Place 1 tablet (0.4 mg total) under the tongue every 5 (five) minutes as needed for chest pain.   oxyCODONE 5 MG immediate release tablet Commonly known as: Oxy IR/ROXICODONE Take 5 mg by mouth 2 (two) times daily as needed for breakthrough pain.   Vitamin D-3 125 MCG (5000 UT) Tabs Take 5,000 Units by mouth daily.   Xtampza ER 13.5 MG C12a Generic drug: oxyCODONE ER Take 13.5 mg by mouth every 12 (twelve) hours.        DISCHARGE INSTRUCTIONS:   Follow-up PMD 5 days Follow-up cardiology 1 week Follow-up cardiac rehab  If you experience worsening of your admission symptoms, develop shortness of breath, life threatening emergency, suicidal or homicidal thoughts you must seek medical attention immediately by calling 911 or calling your MD immediately  if symptoms less severe.  You Must read complete instructions/literature along with all the possible adverse reactions/side effects for all the Medicines you take and that have been prescribed to you. Take any new Medicines after you have completely understood and accept all the possible adverse reactions/side effects.   Please note  You were cared for by a hospitalist during your hospital stay. If you have any questions about your discharge medications or the care you received while you were in the hospital after you are discharged, you can call the unit and asked to speak with the hospitalist on call if the hospitalist that took care of you is not available. Once you are discharged, your primary care physician will handle any further medical issues. Please note that NO REFILLS for any discharge medications will be authorized once you are discharged, as it is imperative that you return to your primary care physician (or establish a relationship with a primary care physician if  you do not have one) for your aftercare needs so that they can reassess your need for medications and monitor your lab values.    Today   CHIEF COMPLAINT:   Chief Complaint  Patient presents with  . Shortness of Breath    HISTORY OF PRESENT ILLNESS:  [name] [name]  is a 85 y.o. female came in with shortness of breath   VITAL SIGNS:  Blood pressure 104/65, pulse 66, temperature 98.1 F (36.7 C), temperature source Oral, resp. rate 16, height 5\' 7"  (1.702 m), weight 50.2 kg, SpO2 92 %.  I/O:    Intake/Output Summary (Last 24 hours) at 06/14/2020 1631 Last data filed at 06/14/2020 0600 Gross per 24 hour  Intake 480 ml  Output --  Net 480 ml    PHYSICAL EXAMINATION:  GENERAL:  85 y.o.-year-old patient lying in the bed with no acute distress.  EYES: Pupils equal, round, reactive to light and accommodation. No scleral icterus.  Extraocular muscles intact.  HEENT: Head atraumatic, normocephalic. Oropharynx and nasopharynx clear.   LUNGS: Normal breath sounds bilaterally, no wheezing, rales,rhonchi or crepitation. No use of accessory muscles of respiration.  CARDIOVASCULAR: S1, S2 normal. No murmurs, rubs, or gallops.  ABDOMEN: Soft, non-tender, non-distended. Bowel sounds present. No organomegaly or mass.  EXTREMITIES: No pedal edema.  NEUROLOGIC: Cranial nerves II through XII are intact. Muscle strength 5/5 in all extremities. Sensation intact. Gait not checked.  PSYCHIATRIC: The patient is alert and oriented x 3.  SKIN: No obvious rash, lesion, or ulcer.   DATA REVIEW:   CBC Recent Labs  Lab 06/13/20 0615  WBC 10.4  HGB 9.8*  HCT 29.6*  PLT 288    Chemistries  Recent Labs  Lab 06/11/20 1042 06/12/20 0427 06/13/20 0615 06/13/20 0615 06/14/20 0547  NA 130*   < > 137   < > 136  K 4.0   < > 3.6   < > 3.7  CL 95*   < > 101   < > 100  CO2 23   < > 26   < > 27  GLUCOSE 124*   < > 121*   < > 149*  BUN 8   < > 9   < > 17  CREATININE 0.81   < > 0.70   < > 0.85   CALCIUM 8.5*   < > 8.4*   < > 8.3*  MG  --    < > 2.1  --   --   AST 40  --   --   --   --   ALT 20  --   --   --   --   ALKPHOS 120  --   --   --   --   BILITOT 0.9  --   --   --   --    < > = values in this interval not displayed.    Microbiology Results  Results for orders placed or performed during the hospital encounter of 06/11/20  SARS Coronavirus 2 by RT PCR (hospital order, performed in [facility] hospital lab) Nasopharyngeal Nasopharyngeal Swab     Status: None   Collection Time: 06/11/20  1:53 PM   Specimen: Nasopharyngeal Swab  Result Value Ref Range Status   SARS Coronavirus 2 NEGATIVE NEGATIVE Final    Comment: (NOTE) SARS-CoV-2 target nucleic acids are NOT DETECTED.  The SARS-CoV-2 RNA is generally detectable in upper and lower respiratory specimens during the acute phase of infection. The lowest concentration of SARS-CoV-2 viral copies this assay can detect is 250 copies / mL. A negative result does not preclude SARS-CoV-2 infection and should not be used as the sole basis for treatment or other patient management decisions.  A negative result may occur with improper specimen collection / handling, submission of specimen other than nasopharyngeal swab, presence of viral mutation(s) within the areas targeted by this assay, and inadequate number of viral copies (<250 copies / mL). A negative result must be combined with clinical observations, patient history, and epidemiological information.  Fact Sheet for Patients:   StrictlyIdeas.no  Fact Sheet for Healthcare Providers: BankingDealers.co.za  This test is not yet approved or  cleared by the [place] FDA and has been authorized for detection and/or diagnosis of SARS-CoV-2 by FDA under an Emergency Use Authorization (EUA).  This EUA will remain in effect (meaning this test can be used) for the duration of the COVID-19 declaration under Section 564(b)(1) of  the  Act, 21 U.S.C. section 360bbb-3(b)(1), unless the authorization is terminated or revoked sooner.  Performed at [facility], [address], [place], [place] [address]      Management plans discussed with the patient, family and they are in agreement.  CODE STATUS:     Code Status Orders  (From admission, onward)         Start     Ordered   06/11/20 1253  Limited resuscitation (code)  Continuous       Question Answer Comment  In the event of cardiac or respiratory ARREST: Perform CPR Yes   In the event of cardiac or respiratory ARREST: Perform Intubation/Mechanical Ventilation No   In the event of cardiac or respiratory ARREST: Perform Defibrillation or Cardioversion if indicated Yes   Antiarrhythmic drugs - Any drug used to treat arrhythmias Yes   Vasoactive drug - Any drug used to stabilize blood pressure Yes      06/11/20 1253        Code Status History    Date Active Date Inactive Code Status Order ID Comments User Context   06/11/2020 1246 06/11/2020 1253 Full Code [id number]  Ivor Costa, MD ED   Advance Care Planning Activity    Advance Directive Documentation     Most Recent Value  Type of Advance Directive Healthcare Power of Attorney, Living will  Pre-existing out of facility DNR order (yellow form or pink MOST form) --  "MOST" Form in Place? --      TOTAL TIME TAKING CARE OF THIS PATIENT: 35 minutes.    Loletha Grayer M.D on 06/14/2020 at 4:31 PM  Between 7am to 6pm - Pager - [phone]  After 6pm go to [email or web address] - password [email or web address]  [facility]  CC: Primary care physician; Mechele Claude, FNP

## 2020-06-14 NOTE — Discharge Instructions (Signed)
Heart Attack The heart is a muscle that needs oxygen to survive. A heart attack is a condition that occurs when your heart does not get enough oxygen. When this happens, the heart muscle begins to die. This can cause permanent damage if not treated right away. A heart attack is a medical emergency. This condition may be called a myocardial infarction, or MI. It is also known as acute coronary syndrome (ACS). ACS is a term used to describe a group of conditions that affect blood flow to the heart. What are the causes? This condition may be caused by:  Atherosclerosis. This occurs when a fatty substance called plaque builds up in the arteries and blocks or reduces blood supply to the heart.  A blood clot. A blood clot can develop suddenly when plaque breaks up within an artery and blocks blood flow to the heart.  Low blood pressure.  An abnormal heartbeat (arrhythmia).  Conditions that cause a decrease of oxygen to the heart, such as anemiaorrespiratory failure.  A spasm, or severe tightening, of a blood vessel that cuts off blood flow to the heart.  Tearing of a coronary artery (spontaneous coronary artery dissection).  High blood pressure. What increases the risk? The following factors may make you more likely to develop this condition:  Aging. The older you are, the higher your risk.  Having a personal or family history of chest pain, heart attack, stroke, or narrowing of the arteries in the legs, arms, head, or stomach (peripheral artery disease).  Being female.  Smoking.  Not getting regular exercise.  Being overweight or obese.  Having high blood pressure.  Having high cholesterol (hypercholesterolemia).  Having diabetes.  Drinking too much alcohol.  Using illegal drugs, such as cocaine or methamphetamine. What are the signs or symptoms? Symptoms of this condition may vary, depending on factors like gender and age. Symptoms may include:  Chest pain. It may feel  like: ? Crushing or squeezing. ? Tightness, pressure, fullness, or heaviness.  Pain in the arm, neck, jaw, back, or upper body.  Shortness of breath.  Heartburn or upset stomach.  Nausea.  Sudden cold sweats.  Feeling tired.  Sudden light-headedness. How is this diagnosed? This condition may be diagnosed through tests, such as:  Electrocardiogram (ECG) to measure the electrical activity of your heart.  Blood tests to check for cardiac markers. These chemicals are released by a damaged heart muscle.  A test to evaluate blood flow and heart function (coronary angiogram).  CT scan to see the heart more clearly.  A test to evaluate the pumping action of the heart (echocardiogram). How is this treated? A heart attack must be treated as soon as possible. Treatment may include:  Medicines to: ? Break up or dissolve blood clots (fibrinolytic therapy). ? Thin blood and help prevent blood clots. ? Treat blood pressure. ? Improve blood flow to the heart. ? Reduce pain. ? Reduce cholesterol.  Angioplasty and stent placement. These are procedures to widen a blocked artery and keep it open.  Coronary artery bypass graft, CABG, or open heart surgery. This enables blood to flow to the heart by going around the blocked part of the artery.  Oxygen therapy if needed.  Cardiac rehabilitation. This improves your health and well-being through exercise, education, and counseling. Follow these instructions at home: Medicines  Take over-the-counter and prescription medicines only as told by your health care provider.  Do not take the following medicines unless your health care provider says it is okay  to take them: ? NSAIDs, such as ibuprofen. ? Supplements that contain vitamin A, vitamin E, or both. ? Hormone replacement therapy that contains estrogen with or without progestin. Lifestyle   Do not use any products that contain nicotine or tobacco, such as cigarettes, e-cigarettes,  and chewing tobacco. If you need help quitting, ask your health care provider.  Avoid secondhand smoke.  Exercise regularly. Ask your health care provider about participating in a cardiac rehabilitation program that helps you start exercising safely after a heart attack.  Eat a heart-healthy diet. Your health care provider will tell you what foods to eat.  Maintain a healthy weight.  Learn ways to manage stress.  Do not use illegal drugs. Alcohol use  Do not drink alcohol if: ? Your health care provider tells you not to drink. ? You are pregnant, may be pregnant, or are planning to become pregnant.  If you drink alcohol: ? Limit how much you use to:  0-1 drink a day for women.  0-2 drinks a day for men. ? Be aware of how much alcohol is in your drink. In the [place] one drink equals one 12 oz bottle of beer (355 mL), one 5 oz glass of wine (148 mL), or one 1 oz glass of hard liquor (44 mL). General instructions  Work with your health care provider to manage any other conditions you have, such as high blood pressure or diabetes. These conditions affect your heart.  Get screened for depression, and seek treatment if needed.  Keep your vaccinations up to date. Get the flu vaccine every year.  Keep all follow-up visits as told by your health care provider. This is important. Contact a health care provider if:  You feel overwhelmed or sad.  You have trouble doing your daily activities. Get help right away if:  You have sudden, unexplained discomfort in your chest, arms, back, neck, jaw, or upper body.  You have shortness of breath.  You suddenly start to sweat or your skin gets clammy.  You feel nauseous or you vomit.  You have unexplained tiredness or weakness.  You suddenly feel light-headed or dizzy.  You notice your heart starts to beat fast or feels like it is skipping beats.  You have blood pressure that is higher than 180/120. These symptoms may represent a  serious problem that is an emergency. Do not wait to see if the symptoms will go away. Get medical help right away. Call your local emergency services (911 in the [place] Do not drive yourself to the hospital. Summary  A heart attack, also called myocardial infarction, is a condition that occurs when your heart does not get enough oxygen. This is caused by anything that blocks or reduces blood flow to the heart.  Treatment is a combination of medicines and surgeries, if needed, to open the blocked arteries and restore blood flow to the heart.  A heart attack is an emergency. Get help right away if you have sudden discomfort in your chest, arms, back, neck, jaw, or upper body. Seek help if you feel nauseous, you vomit, or you feel light-headed or dizzy. This information is not intended to replace advice given to you by your health care provider. Make sure you discuss any questions you have with your health care provider. Document Revised: 02/04/2019 Document Reviewed: 02/08/2019 Elsevier Patient Education  2020 [employer]  

## 2020-06-14 NOTE — Progress Notes (Signed)
Progress Note  Patient Name: [name] [name] Date of Encounter: 06/14/2020  [facility] Cardiologist: New- Dr. Fletcher Anon rounding  Subjective   Patient states doing okay. Denies chest pain or shortness of breath. She was able to ambulate for longer nursing station last evening and today without any symptoms. Will like to go home.  Inpatient Medications    Scheduled Meds: . aspirin EC  81 mg Oral Daily  . atorvastatin  40 mg Oral Daily  . cholecalciferol  5,000 Units Oral Daily  . clopidogrel  75 mg Oral Q breakfast  . feeding supplement (ENSURE ENLIVE)  237 mL Oral BID BM  . furosemide  40 mg Oral Daily  . gabapentin  400 mg Oral QID  . ipratropium-albuterol  3 mL Nebulization BID  . lisinopril  5 mg Oral Daily  . metoprolol succinate  50 mg Oral Daily  . multivitamin with minerals  1 tablet Oral Daily  . nicotine  21 mg Transdermal Daily  . sodium chloride flush  3 mL Intravenous Q12H  . sodium chloride flush  3 mL Intravenous Q12H   Continuous Infusions: . sodium chloride    . sodium chloride     PRN Meds: sodium chloride, sodium chloride, acetaminophen, albuterol, dextromethorphan-guaiFENesin, hydrALAZINE, nitroGLYCERIN, ondansetron (ZOFRAN) IV, oxyCODONE, sodium chloride flush, sodium chloride flush   Vital Signs    Vitals:   06/13/20 2004 06/14/20 0318 06/14/20 0726 06/14/20 0806  BP: (!) 117/54 (!) 122/99 104/65   Pulse: 81 65 66   Resp: 18 20 16    Temp: 98.2 F (36.8 C) 98 F (36.7 C) 98.1 F (36.7 C)   TempSrc: Oral  Oral   SpO2: 94% 96% 94% 92%  Weight:      Height:        Intake/Output Summary (Last 24 hours) at 06/14/2020 0925 Last data filed at 06/14/2020 0600 Gross per 24 hour  Intake 960 ml  Output 450 ml  Net 510 ml   Last 3 Weights 06/13/2020 06/12/2020 06/12/2020  Weight (lbs) 110 lb 9.6 oz 114 lb 13.8 oz 114 lb 13.8 oz  Weight (kg) 50.168 kg 52.1 kg 52.1 kg      Telemetry    Sinus rhythm, occasional PACs- Personally Reviewed  ECG    No new  ECG- Personally Reviewed  Physical Exam   GEN: No acute distress.   Neck: No JVD Cardiac: RRR, no murmurs, rubs, or gallops.  Respiratory: Clear to auscultation bilaterally. GI: Soft, nontender, non-distended  MS: No edema; No deformity. Neuro:  Nonfocal  Psych: Normal affect   Labs    High Sensitivity Troponin:   Recent Labs  Lab 06/11/20 1042 06/11/20 1233 06/11/20 1515 06/11/20 1812  TROPONINIHS 2,853* 2,600* 2,289* 2,383*      Chemistry Recent Labs  Lab 06/11/20 1042 06/11/20 1042 06/12/20 0427 06/13/20 0615 06/14/20 0547  NA 130*   < > 136 137 136  K 4.0   < > 3.7 3.6 3.7  CL 95*   < > 101 101 100  CO2 23   < > 28 26 27   GLUCOSE 124*   < > 119* 121* 149*  BUN 8   < > 9 9 17   CREATININE 0.81   < > 0.78 0.70 0.85  CALCIUM 8.5*   < > 8.2* 8.4* 8.3*  PROT 6.5  --   --   --   --   ALBUMIN 3.8  --   --   --   --   AST 40  --   --   --   --  ALT 20  --   --   --   --   ALKPHOS 120  --   --   --   --   BILITOT 0.9  --   --   --   --   GFRNONAA >60   < > >60 >60 >60  GFRAA >60   < > >60 >60 >60  ANIONGAP 12   < > 7 10 9    < > = values in this interval not displayed.     Hematology Recent Labs  Lab 06/11/20 1042 06/11/20 1042 06/12/20 0427 06/12/20 0829 06/13/20 0615  WBC 10.3  --  9.9  --  10.4  RBC 3.31*   < > 3.56* 3.39* 3.36*  HGB 9.5*  --  10.2*  --  9.8*  HCT 30.0*  --  31.3*  --  29.6*  MCV 90.6  --  87.9  --  88.1  MCH 28.7  --  28.7  --  29.2  MCHC 31.7  --  32.6  --  33.1  RDW 12.4  --  12.7  --  13.0  PLT 318  --  316  --  288   < > = values in this interval not displayed.    BNP Recent Labs  Lab 06/11/20 1042  BNP 1,621.3*     DDimer No results for input(s): DDIMER in the last 168 hours.   Radiology    CARDIAC CATHETERIZATION  Result Date: 06/12/2020  A drug-eluting stent was successfully placed using a STENT RESOLUTE ONYX 2.75X15.  Mid LAD-1 lesion is 99% stenosed.  Post intervention, there is a 0% residual stenosis.   Mid LAD-2 lesion is 30% stenosed.  There is moderate to severe left ventricular systolic dysfunction.  The left ventricular ejection fraction is 25-35% by visual estimate.  LV end diastolic pressure is moderately elevated.  Prox RCA to Mid RCA lesion is 30% stenosed.  Dist RCA lesion is 30% stenosed.  1.  Severe one-vessel coronary artery disease with 99% stenosis in the mid LAD at the origin of second diagonal.  No other obstructive disease. 2.  Moderately severely reduced LV systolic function with an EF of 35% with segmental wall motion abnormalities consistent with an LAD infarct.  Moderately elevated left ventricular end-diastolic pressure 3.  Successful angioplasty and drug-eluting stent placement to the mid LAD.  Second diagonal was jailed by the stent initially with loss of flow that improved after giving intracoronary nitroglycerin.  There was TIMI-3 flow at the end. Recommendations: Dual antiplatelet therapy for at least 1 year. Aggressive treatment of risk factors. Blood pressure was somewhat on the low side and thus an ACE inhibitor/ARB has not been started yet.    Cardiac Studies   LHC/PCI (06/12/2020): 1. Severe one-vessel coronary artery disease with 99% stenosis in the mid LAD at the origin of second diagonal. No other obstructive disease. 2. Moderately severely reduced LV systolic function with an EF of 35% with segmental wall motion abnormalities consistent with an LAD infarct. Moderately elevated left ventricular end-diastolic pressure 3. Successful angioplasty and drug-eluting stent placement to the mid LAD. Second diagonal was jailed by the stent initially with loss of flow that improved after giving intracoronary nitroglycerin. There was TIMI-3 flow at the end.  Echocardiogram (06/12/2020): 1. Left ventricular ejection fraction, by estimation, is 30 to 35%. The  left ventricle has moderately decreased function. The left ventricle  demonstrates regional wall motion  abnormalities (see scoring  diagram/findings for description). There is mild  left ventricular hypertrophy. Left ventricular diastolic parameters are  consistent with Grade II diastolic dysfunction (pseudonormalization).  There is severe hypokinesis of the left ventricular, mid-apical  anteroseptal wall, anterior wall and inferior  wall. Wall motion is suggestive of an LAD infarct vs. stress induced  cardiomyopathy.  2. Right ventricular systolic function is normal. The right ventricular  size is normal. There is mildly elevated pulmonary artery systolic  pressure.  3. The mitral valve is normal in structure. Moderate mitral valve  regurgitation. No evidence of mitral stenosis.  4. The aortic valve is normal in structure. Aortic valve regurgitation is  not visualized. Mild to moderate aortic valve sclerosis/calcification is  present, without any evidence of aortic stenosis.   Patient Profile     85 y.o. female. History of smoking presenting with chest pain found to have NSTEMI and acute heart failure reduced ejection fraction, EF 30 to 35%  Assessment & Plan    1. NSTEMI s/p pci to LAD -Chest pain-free -Continue on aspirin, Plavix, Lipitor -Cardiac rehab upon discharge  2. Heart failure reduced ejection fraction, EF 30 to 35% -Toprol-XL, lisinopril, Lasix 40 daily -Patient is euvolemic  3. Tobacco use -Cessation advised -Patient plans on continuing nicotine patch to help quit  Patient can be discharged from a cardiac perspective. Close follow-up in 1 to 2 weeks in the office.  Total encounter time 35 minutes  Greater than 50% was spent in counseling and coordination of care with the patient    Signed, Kate Sable, MD  06/14/2020, 9:25 AM

## 2020-06-22 ENCOUNTER — Telehealth: Payer: Self-pay | Admitting: Family

## 2020-06-22 NOTE — Telephone Encounter (Signed)
Spoke to patient who is completely new to Heart Failure and not sure why she needs to keep her new patient CHF Clinic appointment with [place]. She is suppose to see a cardiologist team tomorrow and will decide from there if this appointment is necessary for her. She had a new patient CHF Clinic appointment made while she was in hospital after diagnosed with Heart Failure. Explained to patient we will keep appointment in place unless she tells me otherwise.   Wahid Holley, NT

## 2020-06-23 ENCOUNTER — Encounter: Payer: Self-pay | Admitting: Family

## 2020-06-23 ENCOUNTER — Other Ambulatory Visit: Payer: Self-pay

## 2020-06-23 ENCOUNTER — Ambulatory Visit: Payer: Medicare Other | Admitting: Family

## 2020-06-23 VITALS — BP 116/50 | HR 56 | Ht 67.5 in | Wt 112.0 lb

## 2020-06-23 DIAGNOSIS — I25118 Atherosclerotic heart disease of native coronary artery with other forms of angina pectoris: Secondary | ICD-10-CM | POA: Diagnosis not present

## 2020-06-23 DIAGNOSIS — I255 Ischemic cardiomyopathy: Secondary | ICD-10-CM | POA: Diagnosis not present

## 2020-06-23 DIAGNOSIS — I5022 Chronic systolic (congestive) heart failure: Secondary | ICD-10-CM | POA: Diagnosis not present

## 2020-06-23 DIAGNOSIS — E785 Hyperlipidemia, unspecified: Secondary | ICD-10-CM | POA: Diagnosis not present

## 2020-06-23 DIAGNOSIS — I10 Essential (primary) hypertension: Secondary | ICD-10-CM

## 2020-06-23 DIAGNOSIS — Z720 Tobacco use: Secondary | ICD-10-CM

## 2020-06-23 MED ORDER — ATORVASTATIN CALCIUM 40 MG PO TABS: 40.0000 mg | ORAL_TABLET | Freq: Every day | ORAL | 1 refills | Status: DC

## 2020-06-23 MED ORDER — LISINOPRIL 5 MG PO TABS: 5.0000 mg | ORAL_TABLET | Freq: Every day | ORAL | 1 refills | Status: DC

## 2020-06-23 MED ORDER — CLOPIDOGREL BISULFATE 75 MG PO TABS: 75.0000 mg | ORAL_TABLET | Freq: Every day | ORAL | 1 refills | Status: DC

## 2020-06-23 MED ORDER — FUROSEMIDE 20 MG PO TABS
20.0000 mg | ORAL_TABLET | ORAL | 1 refills | Status: DC
Start: 2020-06-23 — End: 2022-12-25

## 2020-06-23 MED ORDER — METOPROLOL SUCCINATE ER 50 MG PO TB24: 50.0000 mg | ORAL_TABLET | Freq: Every day | ORAL | 1 refills | Status: DC

## 2020-06-23 NOTE — Progress Notes (Signed)
Office Visit    Patient Name: [name] [name] Date of Encounter: 06/24/2020  Primary Care Provider:  Mechele Claude, [place] Primary Cardiologist:  Kathlyn Sacramento, MD Electrophysiologist:  None   Chief Complaint    [name] [name] is a 85 y.o. female with a hx of CAD s/p DES to LAD 06/12/2020, ischemic cardiomyopathy, basal cell carcinoma, HTN, scoliosis presents today for hospital follow up.    Past Medical History    Past Medical History:  Diagnosis Date  . Atrial fibrillation ([place])   . Basal cell carcinoma 2021   L temple  . Chronic back pain   . Congestive heart failure (CHF) ([place])   . Hypertension   . Scoliosis    Past Surgical History:  Procedure Laterality Date  . ABDOMINAL SURGERY    . bilateral hip replacement    . CORONARY STENT INTERVENTION N/A 06/12/2020   Procedure: CORONARY STENT INTERVENTION;  Surgeon: Wellington Hampshire, MD;  Location: [facility];  Service: Cardiovascular;  Laterality: N/A;  LAD  . LEFT HEART CATH AND CORONARY ANGIOGRAPHY N/A 06/12/2020   Procedure: LEFT HEART CATH AND CORONARY ANGIOGRAPHY;  Surgeon: Wellington Hampshire, MD;  Location: [facility];  Service: Cardiovascular;  Laterality: N/A;  . TONSILLECTOMY      Allergies  Allergies  Allergen Reactions  . Sulfa Antibiotics Other (See Comments)    Unknown, happened as a child    History of Present Illness    [name] [name] is a 85 y.o. female with a hx of CAD s/p DES to LAD 06/12/2020, ischemic cardiomyopathy, basal cell carcinoma, HTN, scoliosis last seen while hospitalized.  Moved from [place] to [place] within the last year to be closet to her sister and her family. She is a retired [employer] manager  Presented to [facility] 06/11/2020 due to dyspnea as well as left upper extremity pain.  Her troponin was elevated and cardiology was consulted.  Diagnosed with NSTEMI.  She underwent cardiac catheterization 06/12/2020 showing severe one-vessel coronary disease with 99% stenosis in the  mid LAD origin of second diagonal-underwent angioplasty and DES.  EF 35% by LHC with moderately elevated LVEDP.  Initiation of ACE/ARB was somewhat difficult due to relative low blood pressure.  Recommended for DAPT X 1 year.  Echo 06/12/2020 EF 30-35%, mild LVH, grade 2 diastolic dysfunction, severe hypokinesis of LV with wall motion suggestive LAD infarct, mildly elevated PASP, no significant valvular abnormalities.  Presents today for follow-up.  She has numerous questions about her hospitalization while her medications were changed.  We reviewed all of her testing in the hospital as well as guideline directed medical therapy indications and she was thankful for the assistance.  Reports she has been fatigued since leaving the hospital. Attributes this to lack of sleep in the hospital. Is grateful to be home with her cat.  Tells me her level of energy is gradually improving.  Reports no recurrent chest pain, pressure, tightness.  Reports no shortness of breath or dyspnea on exertion.  EKGs/Labs/Other Studies Reviewed:   The following studies were reviewed today:  LHC/PCI (06/12/2020): 1.  Severe one-vessel coronary artery disease with 99% stenosis in the mid LAD at the origin of second diagonal.  No other obstructive disease. 2.  Moderately severely reduced LV systolic function with an EF of 35% with segmental wall motion abnormalities consistent with an LAD infarct.  Moderately elevated left ventricular end-diastolic pressure 3.  Successful angioplasty and drug-eluting stent placement to the mid LAD.  Second  diagonal was jailed by the stent initially with loss of flow that improved after giving intracoronary nitroglycerin.  There was TIMI-3 flow at the end.   Echocardiogram (06/12/2020):  1. Left ventricular ejection fraction, by estimation, is 30 to 35%. The  left ventricle has moderately decreased function. The left ventricle  demonstrates regional wall motion abnormalities (see scoring   diagram/findings for description). There is mild  left ventricular hypertrophy. Left ventricular diastolic parameters are  consistent with Grade II diastolic dysfunction (pseudonormalization).  There is severe hypokinesis of the left ventricular, mid-apical  anteroseptal wall, anterior wall and inferior  wall. Wall motion is suggestive of an LAD infarct vs. stress induced  cardiomyopathy.   2. Right ventricular systolic function is normal. The right ventricular  size is normal. There is mildly elevated pulmonary artery systolic  pressure.   3. The mitral valve is normal in structure. Moderate mitral valve  regurgitation. No evidence of mitral stenosis.   4. The aortic valve is normal in structure. Aortic valve regurgitation is  not visualized. Mild to moderate aortic valve sclerosis/calcification is  present, without any evidence of aortic stenosis.   EKG:  EKG is ordered today.  The ekg ordered today demonstrates sinus bradycardia 56 bpm with PACs and no acute ST/T wave changes.  Recent Labs: 06/11/2020: ALT 20; B Natriuretic Peptide 1,621.3 06/13/2020: Hemoglobin 9.8; Magnesium 2.1; Platelets 288 06/14/2020: BUN 17; Creatinine, Ser 0.85; Potassium 3.7; Sodium 136  Recent Lipid Panel    Component Value Date/Time   CHOL 179 06/12/2020 0427   TRIG 84 06/12/2020 0427   HDL 67 06/12/2020 0427   CHOLHDL 2.7 06/12/2020 0427   VLDL 17 06/12/2020 0427   LDLCALC 95 06/12/2020 0427    Home Medications   Current Meds  Medication Sig  . aspirin 81 MG chewable tablet Chew 81 mg by mouth daily.   Marland Kitchen atorvastatin (LIPITOR) 40 MG tablet Take 1 tablet (40 mg total) by mouth daily.  . Cholecalciferol (VITAMIN D-3) 125 MCG (5000 UT) TABS Take 5,000 Units by mouth daily.   . clopidogrel (PLAVIX) 75 MG tablet Take 1 tablet (75 mg total) by mouth daily with breakfast.  . feeding supplement, ENSURE ENLIVE, (ENSURE ENLIVE) LIQD Take 237 mLs by mouth 2 (two) times daily between meals.  . furosemide  (LASIX) 20 MG tablet Take 1 tablet (20 mg total) by mouth every other day.  . gabapentin (NEURONTIN) 400 MG capsule Take 400 mg by mouth 4 (four) times daily.  Marland Kitchen lisinopril (ZESTRIL) 5 MG tablet Take 1 tablet (5 mg total) by mouth daily.  . metoprolol succinate (TOPROL-XL) 50 MG 24 hr tablet Take 1 tablet (50 mg total) by mouth daily. Take with or immediately following a meal.  . Multiple Vitamins-Minerals (ICAPS AREDS 2 PO) Take 1 tablet by mouth daily.  . nicotine (NICODERM CQ - DOSED IN MG/24 HOURS) 21 mg/24hr patch 21mg  patch chest wall daily (okay to substitute generic)  . nitroGLYCERIN (NITROSTAT) 0.4 MG SL tablet Place 1 tablet (0.4 mg total) under the tongue every 5 (five) minutes as needed for chest pain.  Marland Kitchen oxyCODONE (OXY IR/ROXICODONE) 5 MG immediate release tablet Take 5 mg by mouth 2 (two) times daily as needed for breakthrough pain.   Marland Kitchen XTAMPZA ER 13.5 MG C12A Take 13.5 mg by mouth every 12 (twelve) hours.  . [DISCONTINUED] atorvastatin (LIPITOR) 40 MG tablet Take 1 tablet (40 mg total) by mouth daily.  . [DISCONTINUED] clopidogrel (PLAVIX) 75 MG tablet Take 1 tablet (75 mg  total) by mouth daily with breakfast.  . [DISCONTINUED] furosemide (LASIX) 20 MG tablet Take 1 tablet (20 mg total) by mouth daily.  . [DISCONTINUED] lisinopril (ZESTRIL) 5 MG tablet Take 1 tablet (5 mg total) by mouth daily.  . [DISCONTINUED] metoprolol succinate (TOPROL-XL) 50 MG 24 hr tablet Take 1 tablet (50 mg total) by mouth daily. Take with or immediately following a meal.    Review of Systems   Review of Systems  Constitutional: Negative for chills, fever and malaise/fatigue.  Cardiovascular: Negative for chest pain, dyspnea on exertion, irregular heartbeat, leg swelling, near-syncope, orthopnea, palpitations and syncope.  Respiratory: Negative for cough, shortness of breath and wheezing.   Skin:       (+) bruises "from all the IVs"  Gastrointestinal: Negative for melena, nausea and vomiting.    Genitourinary: Negative for hematuria.  Neurological: Negative for dizziness, light-headedness and weakness.   All other systems reviewed and are otherwise negative except as noted above.  Physical Exam    VS:  BP (!) 116/50 (BP Location: Left Arm, Patient Position: Sitting, Cuff Size: Normal)   Pulse (!) 56   Ht 5' 7.5" (1.715 m)   Wt 112 lb (50.8 kg)   BMI 17.28 kg/m  , BMI Body mass index is 17.28 kg/m. GEN: Well nourished, well developed, in no acute distress. HEENT: normal. Neck: Supple, no JVD, carotid bruits, or masses. Cardiac: RRR, no murmurs, rubs, or gallops. No clubbing, cyanosis, edema.  Radials/DP/PT 2+ and equal bilaterally.  Respiratory:  Respirations regular and unlabored, clear to auscultation bilaterally. GI: Soft, nontender, nondistended, BS + x 4. MS: No deformity or atrophy. Skin: Warm and dry, no rash.  Right radial cath site without signs of infection, no hematoma.  She has scattered ecchymosis to bilateral upper extremities. Neuro:  Strength and sensation are intact. Psych: Normal affect.  Assessment & Plan    1. CAD s/p PCI to LAD -no anginal symptoms.  GDMT includes aspirin, Plavix, atorvastatin statin, metoprolol, PRN nitroglycerin.  DAPT indicated for 1 year from 06/12/20, she reports mild bruising but were tells me she was easy to bruise prior to starting the Plavix.  Encouraged to participate in cardiac rehab.  Referral previously placed.  2. ICM/HFrEF (EF 30-35%) - Euvolemic and well compensated on exam.  Has upcoming appointment with heart failure clinic at [facility].  As she is euvolemic by exam with no dyspnea and multiple PACs noted on EKG today, will reduce her Lasix to 20 mg every other day.  GDMT includes metoprolol, lisinopril, lasix.  Further up titration of heart failure therapies limited by relative hypotension.  Likely will repeat echo in 3 months.  3. HLD, LDL <70 - 06/12/20 LDL 95. Started on Atorvastatin 40mg  daily while hospitalized .  Plan for  repeat lipid/liver at follow-up.  Discussed LDL goal less than 70.  4. Tobacco use - Smoking cessation encouraged. Recommend utilization of [id number].  5. SVT - During admission.  Reports no recurrent palpitations.  Continue metoprolol.  6. HTN -BP low normal today.  Reports no lightheadedness, dizziness.  Encouraged to monitor blood pressure at home and keep a log.  Continue present antihypertensive regimen including lisinopril 5 mg daily, Toprol 50 mg daily.  Disposition: Follow up in 6 week(s) with Dr. Tor Netters, NP 06/24/2020, 2:22 PM

## 2020-06-23 NOTE — Patient Instructions (Addendum)
Medication Instructions:  Your physician has recommended you make the following change in your medication:   - CHANGE your Lasix to 20mg  every other day  - Your Lisinopril, Metoprolol, and Furosemide (Lasix) are meant to help improve your heart pumping function.   - Your Aspirin and Clopidogrel (Plavix) are to protect that stent that was placed. You will be on the Aspirin indefinitely and on the Clopidogrel (Plavix) for at least one year.  - Refills were sent to your pharmacy.   *If you need a refill on your cardiac medications before your next appointment, please call your pharmacy*  Lab Work: No lab work today.  Our goal is for your LDL or lousy cholesterol to be less than 70. It was 95 when checked in the hospital. We will plan to recheck it in a couple months as the new Atorvastatin (Lipitor) will help lower your LDL.  Testing/Procedures: Your EKG today shows sinus bradycardia which is a stable heart rhythm. It also showed an occasional early heart beat called a PAC which is not dangerous and very common.   Follow-Up: At [facility], you and your health needs are our priority.  As part of our continuing mission to provide you with exceptional heart care, we have created designated Provider Care Teams.  These Care Teams include your primary Cardiologist (physician) and Advanced Practice Providers (APPs -  Physician Assistants and Nurse Practitioners) who all work together to provide you with the care you need, when you need it.  We recommend signing up for the patient portal called "MyChart".  Sign up information is provided on this After Visit Summary.  MyChart is used to connect with patients for Virtual Visits (Telemedicine).  Patients are able to view lab/test results, encounter notes, upcoming appointments, etc.  Non-urgent messages can be sent to your provider as well.   To learn more about what you can do with MyChart, go to [email or web address].    Your next appointment:    6 week(s)  The format for your next appointment:   In Person  Provider:   You may see Kathlyn Sacramento, MD or one of the following Advanced Practice Providers on your designated Care Team:    Murray Hodgkins, NP  Christell Faith, PA-C  Marrianne Mood, PA-C  Laurann Montana, NP  Other Instructions  Your upcoming appointment with the heart failure clinic NP Darylene Price will be very helpful to learn about your heart failure.   You have been referred to cardiac rehab. This is a combination program including monitored exercise, dietary education, and support group. We strongly recommend participating in the program. Expect a phone call from them in approximately 2 weeks. If you do not hear from them, the phone number for cardiac rehab at [facility] is [phone].   Recommend you check your blood pressure and heart rate at least once per day at home and keep a log. Bring bring this log to your upcoming appointment with the heart failure clinic and with our office.

## 2020-06-29 ENCOUNTER — Ambulatory Visit: Payer: Medicare Other | Admitting: Family

## 2020-07-10 ENCOUNTER — Telehealth: Payer: Self-pay | Admitting: Cardiovascular Disease

## 2020-07-10 NOTE — Telephone Encounter (Signed)
Spoke with the patient. Discussed with her why she was on both a BB and ace medication. Adv her that the they are commonly prescribed together for cardiac benefit. Patient sts that she is doing well and tolerating her medications. Patients questions and concerns answered, nothing further needed at this time.

## 2020-07-10 NOTE — Telephone Encounter (Signed)
Pt c/o medication issue:  1. Name of Medication: 2 bp meds   2. How are you currently taking this medication (dosage and times per day)?    3. Are you having a reaction (difficulty breathing--STAT)?  No   4. What is your medication issue? Pharmacy asked patient to call and ask reason for 2 bp meds

## 2020-07-28 ENCOUNTER — Telehealth: Payer: Self-pay | Admitting: Cardiovascular Disease

## 2020-07-28 NOTE — Telephone Encounter (Signed)
Patient fell on 9/15 after appointment and had trouble on stopping her bleeding. Patient was instructed by a doctor (one who helped but not patients normal doctor)  To stop taking her plavix. Patient would like to be instructed on what to do until visit nest week  Please advise

## 2020-07-28 NOTE — Telephone Encounter (Addendum)
Spoke with the patient. Patent had a nstemi and a pci in early August 2021. See is on dapt with plavix and asa.  Patient ambulates with a walker. Patients that while leaving her pcp's office on Wed 07/26/20. She was walking out to the parking lot and tripped over uneven pavement. She denies hitting her head. She did have a laceration to her left arm. Patient sts that there was a lot of bleeding and it took several hours to get it to stop. Her pcp told her that the excess bleeding was due to her being on blood thinners. Pt wonders if her dosage is to high or if she needs to hold the medication.  Adv the patient that she is on the correct therapeutic dosage. Adv her that she is only 5 weeks out from her cardiac event and her dapt should not be interrupted. She denies any current bleeding. She does have some bruising of the arm. She is scheduled to be seen in our office on 08/03/20.  Advised the patient to continue her dapt as prescribed. I will fwd the msg to Dr. Fletcher Anon and call back if he has additional recommendations.  Patient verbalized understanding and voiced appreciation for the call back.  FYI fwd to Dr. Fletcher Anon.

## 2020-07-29 NOTE — Telephone Encounter (Signed)
Agree. Continue DAPT. She has to be more careful.

## 2020-08-03 ENCOUNTER — Ambulatory Visit: Payer: Medicare Other | Admitting: Family

## 2020-08-04 ENCOUNTER — Other Ambulatory Visit: Payer: Self-pay

## 2020-08-04 ENCOUNTER — Encounter: Payer: Self-pay | Admitting: Family

## 2020-08-04 ENCOUNTER — Ambulatory Visit: Payer: Medicare Other | Admitting: Family

## 2020-08-04 VITALS — BP 148/66 | HR 84 | Ht 68.5 in | Wt 108.8 lb

## 2020-08-04 DIAGNOSIS — Z720 Tobacco use: Secondary | ICD-10-CM

## 2020-08-04 DIAGNOSIS — I25118 Atherosclerotic heart disease of native coronary artery with other forms of angina pectoris: Secondary | ICD-10-CM | POA: Diagnosis not present

## 2020-08-04 DIAGNOSIS — I471 Supraventricular tachycardia: Secondary | ICD-10-CM

## 2020-08-04 DIAGNOSIS — I5042 Chronic combined systolic (congestive) and diastolic (congestive) heart failure: Secondary | ICD-10-CM | POA: Diagnosis not present

## 2020-08-04 DIAGNOSIS — E785 Hyperlipidemia, unspecified: Secondary | ICD-10-CM | POA: Diagnosis not present

## 2020-08-04 NOTE — Progress Notes (Signed)
Office Visit    Patient Name: [name] [name] Date of Encounter: 08/04/2020  Primary Care Provider:  Mechele Claude, [place] Primary Cardiologist:  Kathlyn Sacramento, MD Electrophysiologist:  None   Chief Complaint    [name] [name] is a 85 y.o. female with a hx of CAD s/p DES to LAD 06/12/2020, ischemic cardiomyopathy, basal cell carcinoma, HTN, scoliosis presents today for follow up of CAD  Past Medical History    Past Medical History:  Diagnosis Date  . Atrial fibrillation ([place])   . Basal cell carcinoma 2021   L temple  . Chronic back pain   . Congestive heart failure (CHF) ([place])   . Hypertension   . Scoliosis    Past Surgical History:  Procedure Laterality Date  . ABDOMINAL SURGERY    . bilateral hip replacement    . CORONARY STENT INTERVENTION N/A 06/12/2020   Procedure: CORONARY STENT INTERVENTION;  Surgeon: Wellington Hampshire, MD;  Location: [facility];  Service: Cardiovascular;  Laterality: N/A;  LAD  . LEFT HEART CATH AND CORONARY ANGIOGRAPHY N/A 06/12/2020   Procedure: LEFT HEART CATH AND CORONARY ANGIOGRAPHY;  Surgeon: Wellington Hampshire, MD;  Location: [facility];  Service: Cardiovascular;  Laterality: N/A;  . TONSILLECTOMY      Allergies  Allergies  Allergen Reactions  . Sulfa Antibiotics Other (See Comments)    Unknown, happened as a child    History of Present Illness    [name] [name] is a 85 y.o. female with a hx of CAD s/p DES to LAD 06/12/2020, ischemic cardiomyopathy, basal cell carcinoma, HTN, scoliosis last seen while hospitalized.  Moved from [place] to [place] within the last year to be closer to her sister and her family. She is a retired [employer] manager.  Presented to [facility] 06/11/2020 due to dyspnea as well as left upper extremity pain.  Her troponin was elevated and cardiology was consulted.  Diagnosed with NSTEMI.  She underwent cardiac catheterization 06/12/2020 showing severe one-vessel coronary disease with 99% stenosis in the mid  LAD origin of second diagonal-underwent angioplasty and DES.  EF 35% by LHC with moderately elevated LVEDP.  Initiation of ACE/ARB was somewhat difficult due to relative low blood pressure.  Recommended for DAPT X 1 year.  Echo 06/12/2020 EF 30-35%, mild LVH, grade 2 diastolic dysfunction, severe hypokinesis of LV with wall motion suggestive LAD infarct, mildly elevated PASP, no significant valvular abnormalities.  She was last seen 06/23/20 for hospital follow up. Her Lasix was reduced to 20mg  every other day as she was euvolemic on exam. Her EKG showed multiple PACs.   Reports feeling overall well today. She did call the office with concerns of on being both beta blocker and ACE, education provided via phone. She also called with concerns with Plavix and was educated to continue. We thoroughly discussed the importance of DAPT for one year from stent placement. She did fall outside of and scraped her arm last week and had a difficult time getting it to heal. We discussed the importance of using her Katalaya Beel regularly.   Reports no shortness of breath. Endorses no dyspnea on exertion. Reports no chest pain, pressure, or tightness. No edema, orthopnea, PND. Reports no palpitations.   EKGs/Labs/Other Studies Reviewed:   The following studies were reviewed today:  LHC/PCI (06/12/2020): 1.  Severe one-vessel coronary artery disease with 99% stenosis in the mid LAD at the origin of second diagonal.  No other obstructive disease. 2.  Moderately severely reduced LV systolic  function with an EF of 35% with segmental wall motion abnormalities consistent with an LAD infarct.  Moderately elevated left ventricular end-diastolic pressure 3.  Successful angioplasty and drug-eluting stent placement to the mid LAD.  Second diagonal was jailed by the stent initially with loss of flow that improved after giving intracoronary nitroglycerin.  There was TIMI-3 flow at the end.   Echocardiogram (06/12/2020):  1. Left ventricular  ejection fraction, by estimation, is 30 to 35%. The  left ventricle has moderately decreased function. The left ventricle  demonstrates regional wall motion abnormalities (see scoring  diagram/findings for description). There is mild  left ventricular hypertrophy. Left ventricular diastolic parameters are  consistent with Grade II diastolic dysfunction (pseudonormalization).  There is severe hypokinesis of the left ventricular, mid-apical  anteroseptal wall, anterior wall and inferior  wall. Wall motion is suggestive of an LAD infarct vs. stress induced  cardiomyopathy.   2. Right ventricular systolic function is normal. The right ventricular  size is normal. There is mildly elevated pulmonary artery systolic  pressure.   3. The mitral valve is normal in structure. Moderate mitral valve  regurgitation. No evidence of mitral stenosis.   4. The aortic valve is normal in structure. Aortic valve regurgitation is  not visualized. Mild to moderate aortic valve sclerosis/calcification is  present, without any evidence of aortic stenosis.   EKG:  EKG is ordered today.  The ekg ordered today demonstrates sinus bradycardia 56 bpm with PACs and no acute ST/T wave changes.  Recent Labs: 06/11/2020: ALT 20; B Natriuretic Peptide 1,621.3 06/13/2020: Hemoglobin 9.8; Magnesium 2.1; Platelets 288 06/14/2020: BUN 17; Creatinine, Ser 0.85; Potassium 3.7; Sodium 136  Recent Lipid Panel    Component Value Date/Time   CHOL 179 06/12/2020 0427   TRIG 84 06/12/2020 0427   HDL 67 06/12/2020 0427   CHOLHDL 2.7 06/12/2020 0427   VLDL 17 06/12/2020 0427   LDLCALC 95 06/12/2020 0427    Home Medications   Current Meds  Medication Sig  . aspirin 81 MG chewable tablet Chew 81 mg by mouth daily.   Marland Kitchen atorvastatin (LIPITOR) 40 MG tablet Take 1 tablet (40 mg total) by mouth daily.  . Cholecalciferol (VITAMIN D-3) 125 MCG (5000 UT) TABS Take 5,000 Units by mouth daily.   . clopidogrel (PLAVIX) 75 MG tablet Take 1  tablet (75 mg total) by mouth daily with breakfast.  . feeding supplement, ENSURE ENLIVE, (ENSURE ENLIVE) LIQD Take 237 mLs by mouth 2 (two) times daily between meals.  . furosemide (LASIX) 20 MG tablet Take 1 tablet (20 mg total) by mouth every other day.  . gabapentin (NEURONTIN) 400 MG capsule Take 400 mg by mouth 4 (four) times daily.  Marland Kitchen lisinopril (ZESTRIL) 5 MG tablet Take 1 tablet (5 mg total) by mouth daily.  . metoprolol succinate (TOPROL-XL) 50 MG 24 hr tablet Take 1 tablet (50 mg total) by mouth daily. Take with or immediately following a meal.  . Multiple Vitamins-Minerals (ICAPS AREDS 2 PO) Take 1 tablet by mouth daily.  . nicotine (NICODERM CQ - DOSED IN MG/24 HOURS) 21 mg/24hr patch 21mg  patch chest wall daily (okay to substitute generic)  . nitroGLYCERIN (NITROSTAT) 0.4 MG SL tablet Place 1 tablet (0.4 mg total) under the tongue every 5 (five) minutes as needed for chest pain.  Marland Kitchen oxyCODONE (OXY IR/ROXICODONE) 5 MG immediate release tablet Take 5 mg by mouth 2 (two) times daily as needed for breakthrough pain.   Marland Kitchen XTAMPZA ER 13.5 MG C12A Take 13.5  mg by mouth every 12 (twelve) hours.    Review of Systems   Review of Systems  Constitutional: Negative for chills, fever and malaise/fatigue.  Cardiovascular: Negative for chest pain, dyspnea on exertion, irregular heartbeat, leg swelling, near-syncope, orthopnea, palpitations and syncope.  Respiratory: Negative for cough, shortness of breath and wheezing.   Gastrointestinal: Negative for melena, nausea and vomiting.  Genitourinary: Negative for hematuria.  Neurological: Negative for dizziness, light-headedness and weakness.    All other systems reviewed and are otherwise negative except as noted above.  Physical Exam    VS:  BP (!) 148/66 (BP Location: Left Arm, Patient Position: Sitting, Cuff Size: Normal)   Pulse 84   Ht 5' 8.5" (1.74 m)   Wt 108 lb 12.8 oz (49.4 kg)   SpO2 97%   BMI 16.30 kg/m  , BMI Body mass index is  16.3 kg/m. GEN: Well nourished, well developed, in no acute distress. HEENT: normal. Neck: Supple, no JVD, carotid bruits, or masses. Cardiac: RRR, no murmurs, rubs, or gallops. No clubbing, cyanosis, edema.  Radials/DP/PT 2+ and equal bilaterally.  Respiratory:  Respirations regular and unlabored, clear to auscultation bilaterally. GI: Soft, nontender, nondistended, BS + x 4. MS: No deformity or atrophy. Skin: Warm and dry, no rash.  Right radial cath site without signs of infection, no hematoma.  She has scattered ecchymosis to bilateral upper extremities. Neuro:  Strength and sensation are intact. Psych: Normal affect.  Assessment & Plan    1. CAD s/p PCI to LAD -No anginal symptoms.  GDMT includes aspirin, Plavix, atorvastatin statin, metoprolol, PRN nitroglycerin.  DAPT indicated for 1 year from 06/12/20, reiterated importance of Plavix. Well healing abrasion noted to left forearm, encouraged to utilize her Alicia Ackert. CBC today. She has declined cardiac rehab. Low sodium, heart healthy diet encouraged.   2. ICM/HFrEF (EF 30-35%) - Euvolemic and well compensated on exam.  Euvolemic by exam. GDMT includes metoprolol, lisinopril, lasix every other day. Reports home BP 120s, will defer escalation of HF therapies. Echo ordered for November as it will be 3 months from initial echo.   3. HLD, LDL <70 - 06/12/20 LDL 95. Started on Atorvastatin 40mg  daily while hospitalized . Direct LDL today.   Discussed LDL goal less than 70.  4. Tobacco use - Smoking cessation encouraged. Recommend utilization of 1800QUITNOW.  5. SVT - During admission.  Reports no recurrent palpitations.  Continue metoprolol.  6. HTN -BP low normal today.  Reports no lightheadedness, dizziness.  Encouraged to monitor blood pressure at home and keep a log.  Continue present antihypertensive regimen including lisinopril 5 mg daily, Toprol 50 mg daily.  Disposition: Echo in Nov or Dec as she will be 3 months from initial echo.  Follow up after echocardiogram in December with Dr. Fletcher Anon or APP.  Loel Dubonnet, NP 08/04/2020, 1:33 PM

## 2020-08-04 NOTE — Patient Instructions (Addendum)
Medication Instructions:  No medication changes today.   *If you need a refill on your cardiac medications before your next appointment, please call your pharmacy*  Lab Work: Your physician recommends that you return for lab work today: CBC, direct LDL  If you have labs (blood work) drawn today and your tests are completely normal, you will receive your results only by:  MyChart Message (if you have MyChart) OR  A paper copy in the mail If you have any lab test that is abnormal or we need to change your treatment, we will call you to review the results.   Testing/Procedures: Your provider has requested that you have an echocardiogram in November or December. Echocardiography is a painless test that uses sound waves to create images of your heart. It provides your doctor with information about the size and shape of your heart and how well your hearts chambers and valves are working. This procedure takes approximately one hour. There are no restrictions for this procedure.  Follow-Up: At [facility], you and your health needs are our priority.  As part of our continuing mission to provide you with exceptional heart care, we have created designated Provider Care Teams.  These Care Teams include your primary Cardiologist (physician) and Advanced Practice Providers (APPs -  Physician Assistants and Nurse Practitioners) who all work together to provide you with the care you need, when you need it.  We recommend signing up for the patient portal called "MyChart".  Sign up information is provided on this After Visit Summary.  MyChart is used to connect with patients for Virtual Visits (Telemedicine).  Patients are able to view lab/test results, encounter notes, upcoming appointments, etc.  Non-urgent messages can be sent to your provider as well.   To learn more about what you can do with MyChart, go to [email or web address].    Your next appointment:   In December (or after your  echocardiogram)  The format for your next appointment:   In Person  Provider:   You may see Kathlyn Sacramento, MD or one of the following Advanced Practice Providers on your designated Care Team:   Murray Hodgkins, NP  Christell Faith, PA-C  Laurann Montana, NP  Marrianne Mood, PA-C  Cadence Kathlen Mody, [place]  Other Instructions

## 2020-08-05 LAB — CBC WITH DIFFERENTIAL/PLATELET
Basophils Absolute: 0 10*3/uL (ref 0.0–0.2)
Basos: 1 %
EOS (ABSOLUTE): 0 10*3/uL (ref 0.0–0.4)
Eos: 1 %
Hematocrit: 32.6 % — ABNORMAL LOW (ref 34.0–46.6)
Hemoglobin: 10 g/dL — ABNORMAL LOW (ref 11.1–15.9)
Immature Grans (Abs): 0 10*3/uL (ref 0.0–0.1)
Immature Granulocytes: 0 %
Lymphocytes Absolute: 1 10*3/uL (ref 0.7–3.1)
Lymphs: 14 %
MCH: 26 pg — ABNORMAL LOW (ref 26.6–33.0)
MCHC: 30.7 g/dL — ABNORMAL LOW (ref 31.5–35.7)
MCV: 85 fL (ref 79–97)
Monocytes Absolute: 0.7 10*3/uL (ref 0.1–0.9)
Monocytes: 10 %
Neutrophils Absolute: 5.3 10*3/uL (ref 1.4–7.0)
Neutrophils: 74 %
Platelets: 304 10*3/uL (ref 150–450)
RBC: 3.84 x10E6/uL (ref 3.77–5.28)
RDW: 12.7 % (ref 11.7–15.4)
WBC: 7.1 10*3/uL (ref 3.4–10.8)

## 2020-08-05 LAB — LDL CHOLESTEROL, DIRECT: LDL Direct: 39 mg/dL (ref 0–99)

## 2020-08-07 ENCOUNTER — Telehealth: Payer: Self-pay | Admitting: Cardiovascular Disease

## 2020-08-07 NOTE — Telephone Encounter (Addendum)
Spoke with the patient. Patient sts that she is not inquiring about food interactions, but would like some info regarding heart healthy eating.     Adv the patient that I will mail her a copy of our heart healthy eating plan. Verified her mailing address.  Patient voiced appreciation for the assistance.

## 2020-08-07 NOTE — Telephone Encounter (Signed)
Pt c/o medication issue:  1. Name of Medication: Atorvastatin   2. How are you currently taking this medication (dosage and times per day)? 40 mg po q   3. Are you having a reaction (difficulty breathing--STAT)? No   4. What is your medication issue? Patient received a letter from Insurance to be careful about drug and food interactions with this med   Please call

## 2020-09-26 ENCOUNTER — Other Ambulatory Visit: Payer: Medicare Other

## 2020-10-30 ENCOUNTER — Ambulatory Visit: Payer: Medicare Other | Admitting: Family

## 2021-01-02 ENCOUNTER — Other Ambulatory Visit: Payer: Self-pay | Admitting: Family

## 2021-01-02 DIAGNOSIS — I25118 Atherosclerotic heart disease of native coronary artery with other forms of angina pectoris: Secondary | ICD-10-CM

## 2021-02-01 ENCOUNTER — Other Ambulatory Visit: Payer: Self-pay | Admitting: Family

## 2021-02-01 DIAGNOSIS — I25118 Atherosclerotic heart disease of native coronary artery with other forms of angina pectoris: Secondary | ICD-10-CM

## 2021-02-11 ENCOUNTER — Other Ambulatory Visit: Payer: Self-pay | Admitting: Family

## 2021-02-11 DIAGNOSIS — I25118 Atherosclerotic heart disease of native coronary artery with other forms of angina pectoris: Secondary | ICD-10-CM

## 2021-02-12 NOTE — Telephone Encounter (Signed)
LVM for pt to call back.

## 2021-02-12 NOTE — Telephone Encounter (Signed)
Please contact pt for future follow up appointment. Pt overdue for follow up in December. Pt needing refills.

## 2021-02-14 DIAGNOSIS — E782 Mixed hyperlipidemia: Secondary | ICD-10-CM | POA: Diagnosis not present

## 2021-02-14 DIAGNOSIS — R799 Abnormal finding of blood chemistry, unspecified: Secondary | ICD-10-CM | POA: Diagnosis not present

## 2021-02-14 DIAGNOSIS — E559 Vitamin D deficiency, unspecified: Secondary | ICD-10-CM | POA: Diagnosis not present

## 2021-02-14 DIAGNOSIS — I509 Heart failure, unspecified: Secondary | ICD-10-CM | POA: Diagnosis not present

## 2021-02-14 DIAGNOSIS — G990 Autonomic neuropathy in diseases classified elsewhere: Secondary | ICD-10-CM | POA: Diagnosis not present

## 2021-02-14 DIAGNOSIS — I10 Essential (primary) hypertension: Secondary | ICD-10-CM | POA: Diagnosis not present

## 2021-02-14 DIAGNOSIS — M25552 Pain in left hip: Secondary | ICD-10-CM | POA: Diagnosis not present

## 2021-02-14 DIAGNOSIS — J449 Chronic obstructive pulmonary disease, unspecified: Secondary | ICD-10-CM | POA: Diagnosis not present

## 2021-02-14 NOTE — Telephone Encounter (Signed)
Patient states she is going to another office and does not need [place] anymore

## 2021-02-20 ENCOUNTER — Telehealth: Payer: Self-pay

## 2021-02-20 NOTE — Telephone Encounter (Signed)
Calling pt to see if she ever had MOHs with Dr. Lacinda Axon for the [facility] on the L temple.

## 2021-03-07 ENCOUNTER — Other Ambulatory Visit: Payer: Self-pay | Admitting: Family

## 2021-03-07 DIAGNOSIS — I25118 Atherosclerotic heart disease of native coronary artery with other forms of angina pectoris: Secondary | ICD-10-CM

## 2021-03-07 NOTE — Telephone Encounter (Signed)
Rx request sent to pharmacy.  

## 2021-03-21 DIAGNOSIS — M25552 Pain in left hip: Secondary | ICD-10-CM | POA: Diagnosis not present

## 2021-03-21 DIAGNOSIS — M419 Scoliosis, unspecified: Secondary | ICD-10-CM | POA: Diagnosis not present

## 2021-03-21 DIAGNOSIS — I10 Essential (primary) hypertension: Secondary | ICD-10-CM | POA: Diagnosis not present

## 2021-03-21 DIAGNOSIS — E782 Mixed hyperlipidemia: Secondary | ICD-10-CM | POA: Diagnosis not present

## 2021-03-22 ENCOUNTER — Telehealth: Payer: Self-pay

## 2021-03-22 NOTE — Telephone Encounter (Signed)
03/22/21 Spoke to patient about BCC of L temple.  She canceled her [facility] appt with Dr. Lacinda Axon due to having a heart attack.  I advised I could get her rescheduled for [facility] and she said she was going back to [place] in a month and she was going to have the surgeon who removed the area the first time do the surgery.[name] [name]

## 2021-04-25 DIAGNOSIS — I10 Essential (primary) hypertension: Secondary | ICD-10-CM | POA: Diagnosis not present

## 2021-04-25 DIAGNOSIS — F32A Depression, unspecified: Secondary | ICD-10-CM | POA: Diagnosis not present

## 2021-04-25 DIAGNOSIS — J449 Chronic obstructive pulmonary disease, unspecified: Secondary | ICD-10-CM | POA: Diagnosis not present

## 2021-05-23 DIAGNOSIS — I10 Essential (primary) hypertension: Secondary | ICD-10-CM | POA: Diagnosis not present

## 2021-05-23 DIAGNOSIS — I4891 Unspecified atrial fibrillation: Secondary | ICD-10-CM | POA: Diagnosis not present

## 2021-05-23 DIAGNOSIS — G8929 Other chronic pain: Secondary | ICD-10-CM | POA: Diagnosis not present

## 2021-05-23 DIAGNOSIS — J449 Chronic obstructive pulmonary disease, unspecified: Secondary | ICD-10-CM | POA: Diagnosis not present

## 2021-05-23 DIAGNOSIS — E782 Mixed hyperlipidemia: Secondary | ICD-10-CM | POA: Diagnosis not present

## 2021-06-22 ENCOUNTER — Other Ambulatory Visit: Payer: Self-pay | Admitting: Family

## 2021-06-22 ENCOUNTER — Telehealth (HOSPITAL_BASED_OUTPATIENT_CLINIC_OR_DEPARTMENT_OTHER): Payer: Self-pay | Admitting: Family

## 2021-06-22 DIAGNOSIS — I25118 Atherosclerotic heart disease of native coronary artery with other forms of angina pectoris: Secondary | ICD-10-CM

## 2021-06-22 DIAGNOSIS — I10 Essential (primary) hypertension: Secondary | ICD-10-CM

## 2021-06-22 NOTE — Telephone Encounter (Signed)
Left message for patient to call and schedule over due follow up with Laurann Montana, NP ( in order to refill medicatons)

## 2021-06-25 NOTE — Telephone Encounter (Signed)
Called to schedule overdue appointment with Hoy Morn (to refill medications).  Patient states she has not used [facility] for over a year  and is not sure why we are calling.  I explained we had received a ref[facility] for her Lisinopril.  She states she cannot come to [place] and received all of her medications from the pharmacy the other day.  She states it is a mystery to her why we called.

## 2021-06-27 DIAGNOSIS — J449 Chronic obstructive pulmonary disease, unspecified: Secondary | ICD-10-CM | POA: Diagnosis not present

## 2021-06-27 DIAGNOSIS — F32A Depression, unspecified: Secondary | ICD-10-CM | POA: Diagnosis not present

## 2021-06-27 DIAGNOSIS — G3184 Mild cognitive impairment, so stated: Secondary | ICD-10-CM | POA: Diagnosis not present

## 2021-06-27 DIAGNOSIS — I10 Essential (primary) hypertension: Secondary | ICD-10-CM | POA: Diagnosis not present

## 2021-06-27 DIAGNOSIS — E782 Mixed hyperlipidemia: Secondary | ICD-10-CM | POA: Diagnosis not present

## 2021-08-01 DIAGNOSIS — Z9861 Coronary angioplasty status: Secondary | ICD-10-CM | POA: Diagnosis not present

## 2021-08-01 DIAGNOSIS — E782 Mixed hyperlipidemia: Secondary | ICD-10-CM | POA: Diagnosis not present

## 2021-08-01 DIAGNOSIS — I509 Heart failure, unspecified: Secondary | ICD-10-CM | POA: Diagnosis not present

## 2021-08-01 DIAGNOSIS — I10 Essential (primary) hypertension: Secondary | ICD-10-CM | POA: Diagnosis not present

## 2021-08-01 DIAGNOSIS — I2510 Atherosclerotic heart disease of native coronary artery without angina pectoris: Secondary | ICD-10-CM | POA: Diagnosis not present

## 2021-08-01 DIAGNOSIS — M2550 Pain in unspecified joint: Secondary | ICD-10-CM | POA: Diagnosis not present

## 2021-08-01 DIAGNOSIS — J449 Chronic obstructive pulmonary disease, unspecified: Secondary | ICD-10-CM | POA: Diagnosis not present

## 2021-08-16 DIAGNOSIS — D509 Iron deficiency anemia, unspecified: Secondary | ICD-10-CM

## 2021-08-16 DIAGNOSIS — I482 Chronic atrial fibrillation, unspecified: Secondary | ICD-10-CM

## 2021-08-16 DIAGNOSIS — I471 Supraventricular tachycardia, unspecified: Secondary | ICD-10-CM

## 2021-08-16 DIAGNOSIS — R413 Other amnesia: Secondary | ICD-10-CM | POA: Diagnosis not present

## 2021-08-16 DIAGNOSIS — G3184 Mild cognitive impairment, so stated: Secondary | ICD-10-CM | POA: Diagnosis not present

## 2021-08-16 DIAGNOSIS — I4891 Unspecified atrial fibrillation: Secondary | ICD-10-CM

## 2021-08-29 DIAGNOSIS — I10 Essential (primary) hypertension: Secondary | ICD-10-CM | POA: Diagnosis not present

## 2021-08-29 DIAGNOSIS — M549 Dorsalgia, unspecified: Secondary | ICD-10-CM | POA: Diagnosis not present

## 2021-08-29 DIAGNOSIS — J449 Chronic obstructive pulmonary disease, unspecified: Secondary | ICD-10-CM | POA: Diagnosis not present

## 2021-08-29 DIAGNOSIS — M419 Scoliosis, unspecified: Secondary | ICD-10-CM | POA: Diagnosis not present

## 2021-08-29 DIAGNOSIS — E782 Mixed hyperlipidemia: Secondary | ICD-10-CM | POA: Diagnosis not present

## 2021-08-29 DIAGNOSIS — I2510 Atherosclerotic heart disease of native coronary artery without angina pectoris: Secondary | ICD-10-CM | POA: Diagnosis not present

## 2021-08-29 DIAGNOSIS — G990 Autonomic neuropathy in diseases classified elsewhere: Secondary | ICD-10-CM | POA: Diagnosis not present

## 2021-08-29 DIAGNOSIS — M2550 Pain in unspecified joint: Secondary | ICD-10-CM | POA: Diagnosis not present

## 2021-10-02 DIAGNOSIS — A46 Erysipelas: Secondary | ICD-10-CM | POA: Diagnosis not present

## 2021-10-02 DIAGNOSIS — J449 Chronic obstructive pulmonary disease, unspecified: Secondary | ICD-10-CM | POA: Diagnosis not present

## 2021-10-02 DIAGNOSIS — I10 Essential (primary) hypertension: Secondary | ICD-10-CM | POA: Diagnosis not present

## 2021-10-02 DIAGNOSIS — E782 Mixed hyperlipidemia: Secondary | ICD-10-CM | POA: Diagnosis not present

## 2021-10-17 DIAGNOSIS — A46 Erysipelas: Secondary | ICD-10-CM | POA: Diagnosis not present

## 2021-10-17 DIAGNOSIS — M2550 Pain in unspecified joint: Secondary | ICD-10-CM | POA: Diagnosis not present

## 2021-10-17 DIAGNOSIS — J449 Chronic obstructive pulmonary disease, unspecified: Secondary | ICD-10-CM | POA: Diagnosis not present

## 2021-10-17 DIAGNOSIS — E559 Vitamin D deficiency, unspecified: Secondary | ICD-10-CM | POA: Diagnosis not present

## 2021-10-17 DIAGNOSIS — E782 Mixed hyperlipidemia: Secondary | ICD-10-CM | POA: Diagnosis not present

## 2021-10-17 DIAGNOSIS — R5383 Other fatigue: Secondary | ICD-10-CM | POA: Diagnosis not present

## 2021-10-17 DIAGNOSIS — I10 Essential (primary) hypertension: Secondary | ICD-10-CM | POA: Diagnosis not present

## 2021-10-17 DIAGNOSIS — D519 Vitamin B12 deficiency anemia, unspecified: Secondary | ICD-10-CM | POA: Diagnosis not present

## 2021-10-17 DIAGNOSIS — G8929 Other chronic pain: Secondary | ICD-10-CM | POA: Diagnosis not present

## 2021-11-06 ENCOUNTER — Other Ambulatory Visit: Payer: Self-pay | Admitting: Family

## 2021-11-06 DIAGNOSIS — I10 Essential (primary) hypertension: Secondary | ICD-10-CM

## 2021-11-06 DIAGNOSIS — I25118 Atherosclerotic heart disease of native coronary artery with other forms of angina pectoris: Secondary | ICD-10-CM

## 2021-11-13 ENCOUNTER — Encounter: Payer: Medicare Other | Attending: Physician Assistant | Admitting: Physician Assistant

## 2021-11-13 ENCOUNTER — Other Ambulatory Visit: Payer: Self-pay

## 2021-11-13 DIAGNOSIS — I110 Hypertensive heart disease with heart failure: Secondary | ICD-10-CM | POA: Insufficient documentation

## 2021-11-13 DIAGNOSIS — I252 Old myocardial infarction: Secondary | ICD-10-CM | POA: Insufficient documentation

## 2021-11-13 DIAGNOSIS — Z87891 Personal history of nicotine dependence: Secondary | ICD-10-CM | POA: Insufficient documentation

## 2021-11-13 DIAGNOSIS — L97322 Non-pressure chronic ulcer of left ankle with fat layer exposed: Secondary | ICD-10-CM | POA: Insufficient documentation

## 2021-11-13 DIAGNOSIS — Z7901 Long term (current) use of anticoagulants: Secondary | ICD-10-CM | POA: Diagnosis not present

## 2021-11-13 DIAGNOSIS — I872 Venous insufficiency (chronic) (peripheral): Secondary | ICD-10-CM | POA: Diagnosis not present

## 2021-11-13 DIAGNOSIS — L97822 Non-pressure chronic ulcer of other part of left lower leg with fat layer exposed: Secondary | ICD-10-CM | POA: Insufficient documentation

## 2021-11-13 DIAGNOSIS — I480 Paroxysmal atrial fibrillation: Secondary | ICD-10-CM | POA: Diagnosis not present

## 2021-11-13 DIAGNOSIS — I5042 Chronic combined systolic (congestive) and diastolic (congestive) heart failure: Secondary | ICD-10-CM | POA: Insufficient documentation

## 2021-11-13 DIAGNOSIS — I10 Essential (primary) hypertension: Secondary | ICD-10-CM | POA: Diagnosis not present

## 2021-11-13 NOTE — Progress Notes (Signed)
[name], [name] ([id number]) Visit Report for 11/13/2021 Abuse/Suicide Risk Screen Details Patient Name: [name] [name], [name] [name] Date of Service: 11/13/2021 2:00 PM Medical Record Number: [id number] Patient Account Number: [id number] Date of Birth/Sex: Oct 31, 1935 (87 y.o. F) Treating RN: Carlene Coria Primary Care Veyda Kaufman: [place] Other Clinician: Referring Serai Tukes: [place] Treating Lianah Peed/Extender: Skipper Cliche in Treatment: 0 Abuse/Suicide Risk Screen Items Answer ABUSE RISK SCREEN: Has anyone close to you tried to hurt or harm you recentlyo No Do you feel uncomfortable with anyone in your familyo No Has anyone forced you do things that you didnot want to doo No Electronic Signature(s) Signed: 11/13/2021 3:55:11 PM By: Carlene Coria RN Entered By: Carlene Coria on 11/13/2021 14:32:04 [name] [name] ([id number]) -------------------------------------------------------------------------------- Activities of Daily Living Details Patient Name: [name], [name] Date of Service: 11/13/2021 2:00 PM Medical Record Number: [id number] Patient Account Number: [id number] Date of Birth/Sex: [birth year] (86 y.o. F) Treating RN: Carlene Coria Primary Care Anasha Perfecto: [place] Other Clinician: Referring Kamira Mellette: [place] Treating Poetry Cerro/Extender: Skipper Cliche in Treatment: 0 Activities of Daily Living Items Answer Activities of Daily Living (Please select one for each item) Drive Automobile Completely Able Take Medications Completely Able Use Telephone Completely Able Care for Appearance Completely Able Use Toilet Completely Able Bath / Shower Completely Able Dress Self Completely Able Feed Self Completely Able Walk Completely Able Get In / Out Bed Completely Able Housework Completely Able Prepare Meals Completely Able Handle Money Completely Able Shop for Self Completely Able Electronic Signature(s) Signed: 11/13/2021 3:55:11 PM By: Carlene Coria RN Entered By: Carlene Coria on 11/13/2021 14:32:36 [name] [name] ([id number]) -------------------------------------------------------------------------------- Education Screening Details Patient Name: [name] [name] Date of Service: 11/13/2021 2:00 PM Medical Record Number: [id number] Patient Account Number: [id number] Date of Birth/Sex: [birth year] (86 y.o. F) Treating RN: Carlene Coria Primary Care Johnica Armwood: [place] Other Clinician: Referring Tymon Nemetz: [place] Treating Claudell Rhody/Extender: Skipper Cliche in Treatment: 0 Learning Preferences/Education Level/Primary Language Learning Preference: Explanation Highest Education Level: College or Above Preferred Language: English Cognitive Barrier Language Barrier: No Translator Needed: No Memory Deficit: No Emotional Barrier: No Cultural/Religious Beliefs Affecting Medical Care: No Physical Barrier Impaired Vision: No Impaired Hearing: No Decreased Hand dexterity: No Knowledge/Comprehension Knowledge Level: Medium Comprehension Level: High Ability to understand written instructions: High Ability to understand verbal instructions: High Motivation Anxiety Level: Anxious Cooperation: Cooperative Education Importance: Acknowledges Need Interest in Health Problems: Asks Questions Perception: Coherent Willingness to Engage in Self-Management High Activities: Readiness to Engage in Self-Management High Activities: Electronic Signature(s) Signed: 11/13/2021 3:55:11 PM By: Carlene Coria RN Entered By: Carlene Coria on 11/13/2021 14:33:17 [name] [name] ([id number]) -------------------------------------------------------------------------------- Fall Risk Assessment Details Patient Name: [name] [name] Date of Service: 11/13/2021 2:00 PM Medical Record Number: [id number] Patient Account Number: [id number] Date of Birth/Sex: Apr 13, 1935 (86 y.o. F) Treating RN: Carlene Coria Primary Care Guenther Dunshee: [place] Other Clinician: Referring  Kayren Holck: [place] Treating Rachid Parham/Extender: Skipper Cliche in Treatment: 0 Fall Risk Assessment Items Have you had 2 or more falls in the last 12 monthso 0 No Have you had any fall that resulted in injury in the last 12 monthso 0 No FALLS RISK SCREEN History of falling - immediate or within 3 months 0 No Secondary diagnosis (Do you have 2 or more medical diagnoseso) 0 No Ambulatory aid None/bed rest/wheelchair/nurse 0 No Crutches/cane/walker 0 No Furniture 0 No Intravenous therapy Access/Saline/Heparin Lock 0 No Gait/Transferring Normal/ bed rest/ wheelchair 0 No Weak (short steps with or without shuffle, stooped but able to lift head while walking, may  0 No seek support from furniture) Impaired (short steps with shuffle, may have difficulty arising from chair, head down, impaired 0 No balance) Mental Status Oriented to own ability 0 No Electronic Signature(s) Signed: 11/13/2021 3:55:11 PM By: Carlene Coria RN Entered By: Carlene Coria on 11/13/2021 14:33:32 [name] [name] ([id number]) -------------------------------------------------------------------------------- Foot Assessment Details Patient Name: [name] [name] Date of Service: 11/13/2021 2:00 PM Medical Record Number: [id number] Patient Account Number: [id number] Date of Birth/Sex: [birth year] (86 y.o. F) Treating RN: Carlene Coria Primary Care Aariz Maish: [place] Other Clinician: Referring Tabb Croghan: [place] Treating Kais Monje/Extender: Skipper Cliche in Treatment: 0 Foot Assessment Items Site Locations + = Sensation present, - = Sensation absent, C = Callus, U = Ulcer R = Redness, W = Warmth, M = Maceration, PU = Pre-ulcerative lesion F = Fissure, S = Swelling, D = Dryness Assessment Right: Left: Other Deformity: No No Prior Foot Ulcer: No No Prior Amputation: No No Charcot Joint: No No Ambulatory Status: Ambulatory Without Help Gait: Steady Electronic Signature(s) Signed: 11/13/2021  3:55:11 PM By: Carlene Coria RN Entered By: Carlene Coria on 11/13/2021 14:35:30 [name] [name] ([id number]) -------------------------------------------------------------------------------- Nutrition Risk Screening Details Patient Name: [name] [name] Date of Service: 11/13/2021 2:00 PM Medical Record Number: [id number] Patient Account Number: [id number] Date of Birth/Sex: [birth year] (86 y.o. F) Treating RN: Carlene Coria Primary Care Jemarcus Dougal: [place] Other Clinician: Referring Karess Harner: [place] Treating Isair Inabinet/Extender: Jeri Cos Weeks in Treatment: 0 Height (in): 67 Weight (lbs): 112 Body Mass Index (BMI): 17.5 Nutrition Risk Screening Items Score Screening NUTRITION RISK SCREEN: I have an illness or condition that made me change the kind and/or amount of food I eat 0 No I eat fewer than two meals per day 0 No I eat few fruits and vegetables, or milk products 0 No I have three or more drinks of beer, liquor or wine almost every day 0 No I have tooth or mouth problems that make it hard for me to eat 0 No I don't always have enough money to buy the food I need 0 No I eat alone most of the time 0 No I take three or more different prescribed or over-the-counter drugs a day 1 Yes Without wanting to, I have lost or gained 10 pounds in the last six months 0 No I am not always physically able to shop, cook and/or feed myself 0 No Nutrition Protocols Good Risk Protocol 0 No interventions needed Moderate Risk Protocol High Risk Proctocol Risk Level: Good Risk Score: 1 Electronic Signature(s) Signed: 11/13/2021 3:55:11 PM By: Carlene Coria RN Entered By: Carlene Coria on 11/13/2021 14:33:47

## 2021-11-13 NOTE — Progress Notes (Signed)
MOIRA, UMHOLTZ ([id number]) Visit Report for 11/13/2021 Chief Complaint Document Details Patient Name: [name] [name], [name] [name] Date of Service: 11/13/2021 2:00 PM Medical Record Number: [id number] Patient Account Number: [id number] Date of Birth/Sex: [birth year] (87 y.o. F) Treating RN: Carlene Coria Primary Care Provider: Georgian Co Other Clinician: Referring Provider: Georgian Co Treating Provider/Extender: Skipper Cliche in Treatment: 0 Information Obtained from: Patient Chief Complaint Left leg and left ankle ulcers Electronic Signature(s) Signed: 11/13/2021 3:05:57 PM By: Worthy Keeler PA-C Entered By: Worthy Keeler on 11/13/2021 15:05:56 [name] [name] ([id number]) -------------------------------------------------------------------------------- Debridement Details Patient Name: [name] [name] Date of Service: 11/13/2021 2:00 PM Medical Record Number: [id number] Patient Account Number: [id number] Date of Birth/Sex: June 06, 1935 (87 y.o. F) Treating RN: Carlene Coria Primary Care Provider: Georgian Co Other Clinician: Referring Provider: Georgian Co Treating Provider/Extender: Skipper Cliche in Treatment: 0 Debridement Performed for Wound #1 Left,Anterior Lower Leg Assessment: Performed By: Physician Tommie Sams., PA-C Debridement Type: Debridement Severity of Tissue Pre Debridement: Fat layer exposed Level of Consciousness (Pre- Awake and Alert procedure): Pre-procedure Verification/Time Out Yes - 15:15 Taken: Start Time: 15:15 Total Area Debrided (L x W): 1 (cm) x 1 (cm) = 1 (cm) Tissue and other material Viable, Non-Viable, Slough, Subcutaneous, Skin: Dermis , Skin: Epidermis, Slough debrided: Level: Skin/Subcutaneous Tissue Debridement Description: Excisional Instrument: Curette Bleeding: Minimum Hemostasis Achieved: Pressure End Time: 15:22 Procedural Pain: 0 Post Procedural Pain: 0 Response to Treatment: Procedure was tolerated well Level of  Consciousness (Post- Awake and Alert procedure): Post Debridement Measurements of Total Wound Length: (cm) 1 Width: (cm) 1 Depth: (cm) 0.1 Volume: (cm) 0.079 Character of Wound/Ulcer Post Debridement: Improved Severity of Tissue Post Debridement: Fat layer exposed Post Procedure Diagnosis Same as Pre-procedure Electronic Signature(s) Signed: 11/13/2021 3:55:11 PM By: Carlene Coria RN Signed: 11/13/2021 4:50:46 PM By: Worthy Keeler PA-C Entered By: Carlene Coria on 11/13/2021 15:21:03 [name] [name] ([id number]) -------------------------------------------------------------------------------- Debridement Details Patient Name: [name] [name] Date of Service: 11/13/2021 2:00 PM Medical Record Number: [id number] Patient Account Number: [id number] Date of Birth/Sex: 20-Mar-1935 (87 y.o. F) Treating RN: Carlene Coria Primary Care Provider: Georgian Co Other Clinician: Referring Provider: Georgian Co Treating Provider/Extender: Skipper Cliche in Treatment: 0 Debridement Performed for Wound #2 Left,Lateral Ankle Assessment: Performed By: Physician Tommie Sams., PA-C Debridement Type: Debridement Severity of Tissue Pre Debridement: Fat layer exposed Level of Consciousness (Pre- Awake and Alert procedure): Pre-procedure Verification/Time Out Yes - 15:15 Taken: Start Time: 15:15 Total Area Debrided (L x W): 2 (cm) x 0.6 (cm) = 1.2 (cm) Tissue and other material Viable, Non-Viable, Slough, Subcutaneous, Skin: Dermis , Skin: Epidermis, Slough debrided: Level: Skin/Subcutaneous Tissue Debridement Description: Excisional Instrument: Curette Bleeding: Minimum Hemostasis Achieved: Pressure End Time: 15:22 Procedural Pain: 0 Post Procedural Pain: 0 Response to Treatment: Procedure was tolerated well Level of Consciousness (Post- Awake and Alert procedure): Post Debridement Measurements of Total Wound Length: (cm) 4 Width: (cm) 0.6 Depth: (cm) 0.2 Volume: (cm)  0.377 Character of Wound/Ulcer Post Debridement: Improved Severity of Tissue Post Debridement: Fat layer exposed Post Procedure Diagnosis Same as Pre-procedure Electronic Signature(s) Signed: 11/13/2021 3:55:11 PM By: Carlene Coria RN Signed: 11/13/2021 4:50:46 PM By: Worthy Keeler PA-C Entered By: Carlene Coria on 11/13/2021 15:21:43 [name] [name] ([id number]) -------------------------------------------------------------------------------- HPI Details Patient Name: [name] [name] Date of Service: 11/13/2021 2:00 PM Medical Record Number: [id number] Patient Account Number: [id number] Date of Birth/Sex: 08/04/1935 (87 y.o. F) Treating RN: Carlene Coria Primary Care Provider: Georgian Co Other Clinician: Referring Provider: Georgian Co Treating Provider/Extender: Joaquim Lai,  Vance Gather in Treatment: 0 History of Present Illness HPI Description: 11/13/2021 upon evaluation today patient presents for initial inspection here in our clinic concerning issues that she has been having with a wound over her left leg at both the leg and ankle location currently. This began somewhere around 2-3 maybe even 4 weeks ago based on what she is telling us today. Nonetheless I do believe that she has a history of chronic venous insufficiency, she also has atrial fibrillation, hypertension, and congestive heart failure. Her leg has been swelling quite a bit here. We noted ABIs to be noncompressible but she does seem to have a pretty good pulse noted on Doppler as well as by palpation. She tells me that the wound on her shin occurred as a result of accidentally striking her self with a bag of groceries it was not even that hard. The one on the ankle which she attributes to shoes that her sister got her that she feels like "somebody wore before she got on and cause this issue". Electronic Signature(s) Signed: 11/13/2021 4:48:29 PM By: Worthy Keeler PA-C Previous Signature: 11/13/2021 3:23:09 PM Version By: Worthy Keeler PA-C Entered By: Worthy Keeler on 11/13/2021 16:48:29 [name] [name] ([id number]) -------------------------------------------------------------------------------- Physical Exam Details Patient Name: [name] [name] Date of Service: 11/13/2021 2:00 PM Medical Record Number: [id number] Patient Account Number: [id number] Date of Birth/Sex: June 23, 1935 (86 y.o. F) Treating RN: Carlene Coria Primary Care Provider: Georgian Co Other Clinician: Referring Provider: Georgian Co Treating Provider/Extender: Skipper Cliche in Treatment: 0 Constitutional patient is hypertensive.. pulse regular and within target range for patient.Marland Kitchen respirations regular, non-labored and within target range for patient.Marland Kitchen temperature within target range for patient.. Well-nourished and well-hydrated in no acute distress. Eyes conjunctiva clear no eyelid edema noted. pupils equal round and reactive to light and accommodation. Ears, Nose, Mouth, and Throat no gross abnormality of ear auricles or external auditory canals. normal hearing noted during conversation. mucus membranes moist. Respiratory normal breathing without difficulty. Cardiovascular 1+ dorsalis pedis/posterior tibialis pulses. 1+ pitting edema of the bilateral lower extremities. Psychiatric this patient is able to make decisions and demonstrates good insight into disease process. Alert and Oriented x 3. pleasant and cooperative. Notes Upon inspection patient's wound bed actually showed signs of some slough buildup on the wounds currently. Fortunately I do not see any signs of systemic infection which is great news and overall very pleased in that regard. Nonetheless I do believe that her blood pressure is quite elevated today also do believe that with the erythema noted and the warmth associated that she could have local cellulitis currently which I think is something that we need to try to address as well. Electronic Signature(s) Signed:  11/13/2021 4:49:22 PM By: Worthy Keeler PA-C Entered By: Worthy Keeler on 11/13/2021 16:49:21 [name] [name] ([id number]) -------------------------------------------------------------------------------- Physician Orders Details Patient Name: [name] [name] Date of Service: 11/13/2021 2:00 PM Medical Record Number: [id number] Patient Account Number: [id number] Date of Birth/Sex: [birth year] (86 y.o. F) Treating RN: Carlene Coria Primary Care Provider: Georgian Co Other Clinician: Referring Provider: Georgian Co Treating Provider/Extender: Skipper Cliche in Treatment: 0 Verbal / Phone Orders: No Diagnosis Coding ICD-10 Coding Code Description I87.2 Venous insufficiency (chronic) (peripheral) L97.822 Non-pressure chronic ulcer of other part of left lower leg with fat layer exposed L97.322 Non-pressure chronic ulcer of left ankle with fat layer exposed I48.0 Paroxysmal atrial fibrillation I10 Essential (primary) hypertension I50.42 Chronic combined systolic (congestive) and diastolic (congestive) heart failure Follow-up Appointments o  Return Appointment in 1 week. Bathing/ Shower/ Hygiene o May shower with wound dressing protected with water repellent cover or cast protector. Anesthetic (Use 'Patient Medications' Section for Anesthetic Order Entry) o Lidocaine applied to wound bed Edema Control - Lymphedema / Segmental Compressive Device / Other o Optional: One layer of unna paste to top of compression wrap (to act as an anchor). o Elevate, Exercise Daily and Avoid Standing for Long Periods of Time. o Elevate legs to the level of the heart and pump ankles as often as possible o Elevate leg(s) parallel to the floor when sitting. Wound Treatment Wound #1 - Lower Leg Wound Laterality: Left, Anterior Cleanser: Soap and Water 1 x Per Week/30 Days Discharge Instructions: Gently cleanse wound with antibacterial soap, rinse and pat dry prior to dressing wounds Primary  Dressing: Hydrofera Blue Ready Transfer Foam, 2.5x2.5 (in/in) 1 x Per Week/30 Days Discharge Instructions: Apply Hydrofera Blue Ready to wound bed as directed Secondary Dressing: ABD Pad 5x9 (in/in) 1 x Per Week/30 Days Discharge Instructions: Cover with ABD pad Compression Wrap: Profore Lite LF 3 Multilayer Compression Bandaging System 1 x Per Week/30 Days Discharge Instructions: Apply 3 multi-layer wrap as prescribed. Wound #2 - Ankle Wound Laterality: Left, Lateral Cleanser: Soap and Water 1 x Per Week/30 Days Discharge Instructions: Gently cleanse wound with antibacterial soap, rinse and pat dry prior to dressing wounds Primary Dressing: Hydrofera Blue Ready Transfer Foam, 2.5x2.5 (in/in) 1 x Per Week/30 Days Discharge Instructions: Apply Hydrofera Blue Ready to wound bed as directed Secondary Dressing: ABD Pad 5x9 (in/in) 1 x Per Week/30 Days Discharge Instructions: Cover with ABD pad Compression Wrap: Profore Lite LF 3 Multilayer Compression Bandaging System 1 x Per Week/30 Days Discharge Instructions: Apply 3 multi-layer wrap as prescribed. [name] [name], [name] [name] ([id number]) Patient Medications Allergies: Sulfa (Sulfonamide Antibiotics) Notifications Medication Indication Start End doxycycline hyclate 11/13/2021 DOSE 1 - oral 100 mg capsule - 1 capsule oral taken 2 times per day for 14 days Electronic Signature(s) Signed: 11/13/2021 3:55:11 PM By: Carlene Coria RN Signed: 11/13/2021 4:50:46 PM By: Worthy Keeler PA-C Previous Signature: 11/13/2021 3:24:36 PM Version By: Worthy Keeler PA-C Entered By: Carlene Coria on 11/13/2021 15:29:49 [name] [name] ([id number]) -------------------------------------------------------------------------------- Problem List Details Patient Name: [name] [name] Date of Service: 11/13/2021 2:00 PM Medical Record Number: [id number] Patient Account Number: [id number] Date of Birth/Sex: [birth year] (86 y.o. F) Treating RN: Cornell Barman Primary Care Provider: Georgian Co Other Clinician: Referring Provider: Georgian Co Treating Provider/Extender: Jeri Cos Weeks in Treatment: 0 Active Problems ICD-10 Encounter Code Description Active Date MDM Diagnosis I87.2 Venous insufficiency (chronic) (peripheral) 11/13/2021 No Yes L97.822 Non-pressure chronic ulcer of other part of left lower leg with fat layer 11/13/2021 No Yes exposed L97.322 Non-pressure chronic ulcer of left ankle with fat layer exposed 11/13/2021 No Yes I48.0 Paroxysmal atrial fibrillation 11/13/2021 No Yes I10 Essential (primary) hypertension 11/13/2021 No Yes I50.42 Chronic combined systolic (congestive) and diastolic (congestive) heart 11/13/2021 No Yes failure Inactive Problems Resolved Problems Electronic Signature(s) Signed: 11/13/2021 3:05:18 PM By: Worthy Keeler PA-C Entered By: Worthy Keeler on 11/13/2021 15:05:17 [name] [name] ([id number]) -------------------------------------------------------------------------------- Progress Note Details Patient Name: [name] [name] Date of Service: 11/13/2021 2:00 PM Medical Record Number: [id number] Patient Account Number: [id number] Date of Birth/Sex: 10/14/1935 (86 y.o. F) Treating RN: Carlene Coria Primary Care Provider: Georgian Co Other Clinician: Referring Provider: Georgian Co Treating Provider/Extender: Skipper Cliche in Treatment: 0 Subjective Chief Complaint Information obtained from Patient Left leg and left ankle ulcers History of  Present Illness (HPI) 11/13/2021 upon evaluation today patient presents for initial inspection here in our clinic concerning issues that she has been having with a wound over her left leg at both the leg and ankle location currently. This began somewhere around 2-3 maybe even 4 weeks ago based on what she is telling us today. Nonetheless I do believe that she has a history of chronic venous insufficiency, she also has atrial fibrillation, hypertension, and congestive heart failure. Her  leg has been swelling quite a bit here. We noted ABIs to be noncompressible but she does seem to have a pretty good pulse noted on Doppler as well as by palpation. She tells me that the wound on her shin occurred as a result of accidentally striking her self with a bag of groceries it was not even that hard. The one on the ankle which she attributes to shoes that her sister got her that she feels like "somebody wore before she got on and cause this issue". Patient History Allergies Sulfa (Sulfonamide Antibiotics) Social History Former smoker, Marital Status - Single, Alcohol Use - Never, Drug Use - No History, Caffeine Use - Daily. Medical History Cardiovascular Patient has history of Congestive Heart Failure, Hypertension Review of Systems (ROS) Integumentary (Skin) Complains or has symptoms of Wounds. Objective Constitutional patient is hypertensive.. pulse regular and within target range for patient.Marland Kitchen respirations regular, non-labored and within target range for patient.Marland Kitchen temperature within target range for patient.. Well-nourished and well-hydrated in no acute distress. Vitals Time Taken: 2:26 PM, Height: 67 in, Source: Stated, Weight: 112 lbs, Source: Stated, BMI: 17.5, Temperature: 97.9 F, Pulse: 67 bpm, Respiratory Rate: 18 breaths/min, Blood Pressure: 199/82 mmHg. Eyes conjunctiva clear no eyelid edema noted. pupils equal round and reactive to light and accommodation. Ears, Nose, Mouth, and Throat no gross abnormality of ear auricles or external auditory canals. normal hearing noted during conversation. mucus membranes moist. Respiratory normal breathing without difficulty. Cardiovascular 1+ dorsalis pedis/posterior tibialis pulses. 1+ pitting edema of the bilateral lower extremities. Psychiatric this patient is able to make decisions and demonstrates good insight into disease process. Alert and Oriented x 3. pleasant and cooperative. [name] [name], [name] [name] ([id number]) General  Notes: Upon inspection patient's wound bed actually showed signs of some slough buildup on the wounds currently. Fortunately I do not see any signs of systemic infection which is great news and overall very pleased in that regard. Nonetheless I do believe that her blood pressure is quite elevated today also do believe that with the erythema noted and the warmth associated that she could have local cellulitis currently which I think is something that we need to try to address as well. Integumentary (Hair, Skin) Wound #1 status is Open. Original cause of wound was Trauma. The date acquired was: 10/11/2021. The wound is located on the Left,Anterior Lower Leg. The wound measures 1cm length x 1cm width x 0.1cm depth; 0.785cm^2 area and 0.079cm^3 volume. There is Fat Layer (Subcutaneous Tissue) exposed. There is no tunneling or undermining noted. There is a medium amount of serosanguineous drainage noted. There is medium (34-66%) red granulation within the wound bed. There is a medium (34-66%) amount of necrotic tissue within the wound bed including Adherent Slough. Wound #2 status is Open. Original cause of wound was Trauma. The date acquired was: 10/24/2021. The wound is located on the Left,Lateral Ankle. The wound measures 4cm length x 0.6cm width x 0.2cm depth; 1.885cm^2 area and 0.377cm^3 volume. There is no tunneling or undermining noted. There is a medium  amount of serosanguineous drainage noted. There is medium (34-66%) pink granulation within the wound bed. There is a medium (34-66%) amount of necrotic tissue within the wound bed including Adherent Slough. Assessment Active Problems ICD-10 Venous insufficiency (chronic) (peripheral) Non-pressure chronic ulcer of other part of left lower leg with fat layer exposed Non-pressure chronic ulcer of left ankle with fat layer exposed Paroxysmal atrial fibrillation Essential (primary) hypertension Chronic combined systolic (congestive) and diastolic  (congestive) heart failure Procedures Wound #1 Pre-procedure diagnosis of Wound #1 is a Venous Leg Ulcer located on the Left,Anterior Lower Leg .Severity of Tissue Pre Debridement is: Fat layer exposed. There was a Excisional Skin/Subcutaneous Tissue Debridement with a total area of 1 sq cm performed by Tommie Sams., PA-C. With the following instrument(s): Curette to remove Viable and Non-Viable tissue/material. Material removed includes Subcutaneous Tissue, Slough, Skin: Dermis, and Skin: Epidermis. No specimens were taken. A time out was conducted at 15:15, prior to the start of the procedure. A Minimum amount of bleeding was controlled with Pressure. The procedure was tolerated well with a pain level of 0 throughout and a pain level of 0 following the procedure. Post Debridement Measurements: 1cm length x 1cm width x 0.1cm depth; 0.079cm^3 volume. Character of Wound/Ulcer Post Debridement is improved. Severity of Tissue Post Debridement is: Fat layer exposed. Post procedure Diagnosis Wound #1: Same as Pre-Procedure Pre-procedure diagnosis of Wound #1 is a Venous Leg Ulcer located on the Left,Anterior Lower Leg . There was a Three Layer Compression Therapy Procedure by Carlene Coria, RN. Post procedure Diagnosis Wound #1: Same as Pre-Procedure Wound #2 Pre-procedure diagnosis of Wound #2 is a Venous Leg Ulcer located on the Left,Lateral Ankle .Severity of Tissue Pre Debridement is: Fat layer exposed. There was a Excisional Skin/Subcutaneous Tissue Debridement with a total area of 1.2 sq cm performed by Tommie Sams., PA-C. With the following instrument(s): Curette to remove Viable and Non-Viable tissue/material. Material removed includes Subcutaneous Tissue, Slough, Skin: Dermis, and Skin: Epidermis. No specimens were taken. A time out was conducted at 15:15, prior to the start of the procedure. A Minimum amount of bleeding was controlled with Pressure. The procedure was tolerated well with a  pain level of 0 throughout and a pain level of 0 following the procedure. Post Debridement Measurements: 4cm length x 0.6cm width x 0.2cm depth; 0.377cm^3 volume. Character of Wound/Ulcer Post Debridement is improved. Severity of Tissue Post Debridement is: Fat layer exposed. Post procedure Diagnosis Wound #2: Same as Pre-Procedure Pre-procedure diagnosis of Wound #2 is a Venous Leg Ulcer located on the Left,Lateral Ankle . There was a Three Layer Compression Therapy Procedure by Carlene Coria, RN. Post procedure Diagnosis Wound #2: Same as Pre-Procedure [name] [name], [name] [name] ([id number]) Plan Follow-up Appointments: Return Appointment in 1 week. Bathing/ Shower/ Hygiene: May shower with wound dressing protected with water repellent cover or cast protector. Anesthetic (Use 'Patient Medications' Section for Anesthetic Order Entry): Lidocaine applied to wound bed Edema Control - Lymphedema / Segmental Compressive Device / Other: Optional: One layer of unna paste to top of compression wrap (to act as an anchor). Elevate, Exercise Daily and Avoid Standing for Long Periods of Time. Elevate legs to the level of the heart and pump ankles as often as possible Elevate leg(s) parallel to the floor when sitting. The following medication(s) was prescribed: doxycycline hyclate oral 100 mg capsule 1 1 capsule oral taken 2 times per day for 14 days starting 11/13/2021 WOUND #1: - Lower Leg Wound Laterality: Left, Anterior Cleanser: Soap  and Water 1 x Per Week/30 Days Discharge Instructions: Gently cleanse wound with antibacterial soap, rinse and pat dry prior to dressing wounds Primary Dressing: Hydrofera Blue Ready Transfer Foam, 2.5x2.5 (in/in) 1 x Per Week/30 Days Discharge Instructions: Apply Hydrofera Blue Ready to wound bed as directed Secondary Dressing: ABD Pad 5x9 (in/in) 1 x Per Week/30 Days Discharge Instructions: Cover with ABD pad Compression Wrap: Profore Lite LF 3 Multilayer Compression Bandaging  System 1 x Per Week/30 Days Discharge Instructions: Apply 3 multi-layer wrap as prescribed. WOUND #2: - Ankle Wound Laterality: Left, Lateral Cleanser: Soap and Water 1 x Per Week/30 Days Discharge Instructions: Gently cleanse wound with antibacterial soap, rinse and pat dry prior to dressing wounds Primary Dressing: Hydrofera Blue Ready Transfer Foam, 2.5x2.5 (in/in) 1 x Per Week/30 Days Discharge Instructions: Apply Hydrofera Blue Ready to wound bed as directed Secondary Dressing: ABD Pad 5x9 (in/in) 1 x Per Week/30 Days Discharge Instructions: Cover with ABD pad Compression Wrap: Profore Lite LF 3 Multilayer Compression Bandaging System 1 x Per Week/30 Days Discharge Instructions: Apply 3 multi-layer wrap as prescribed. 1. I would recommend that we go ahead and send in a prescription for doxycycline to try to help with the erythema. I think that this is a significant issue here. 2. Also can recommend that we have the patient continue with the recommendation for [facility] which I think will do a good job for her. We will also use a 3 layer compression wrap after applying the ABD pad which I think will help with edema control. 3. I would also suggest that she elevate her legs much as possible to help with edema control as I think that is can be the primary concern here. We will see patient back for reevaluation in 1 week here in the clinic. If anything worsens or changes patient will contact our office for additional recommendations. Electronic Signature(s) Signed: 11/13/2021 4:50:03 PM By: Worthy Keeler PA-C Entered By: Worthy Keeler on 11/13/2021 16:50:03 [name] [name] ([id number]) -------------------------------------------------------------------------------- ROS/PFSH Details Patient Name: [name] [name] Date of Service: 11/13/2021 2:00 PM Medical Record Number: [id number] Patient Account Number: [id number] Date of Birth/Sex: 1935-03-04 (86 y.o. F) Treating RN: Carlene Coria Primary Care Provider: Georgian Co Other Clinician: Referring Provider: Georgian Co Treating Provider/Extender: Skipper Cliche in Treatment: 0 Integumentary (Skin) Complaints and Symptoms: Positive for: Wounds Cardiovascular Medical History: Positive for: Congestive Heart Failure; Hypertension Immunizations Pneumococcal Vaccine: Received Pneumococcal Vaccination: No Implantable Devices None Family and Social History Former smoker; Marital Status - Single; Alcohol Use: Never; Drug Use: No History; Caffeine Use: Daily; Financial Concerns: No; Food, Clothing or Shelter Needs: No; Support System Lacking: No; Transportation Concerns: No Electronic Signature(s) Signed: 11/13/2021 3:55:11 PM By: Carlene Coria RN Signed: 11/13/2021 4:50:46 PM By: Worthy Keeler PA-C Entered By: Carlene Coria on 11/13/2021 14:31:47 [name] [name] ([id number]) -------------------------------------------------------------------------------- SuperBill Details Patient Name: [name] [name] Date of Service: 11/13/2021 Medical Record Number: [id number] Patient Account Number: [id number] Date of Birth/Sex: 1935/10/28 (87 y.o. 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 I48.0 Paroxysmal atrial fibrillation I10 Essential (primary) hypertension I50.42 Chronic combined systolic (congestive) and diastolic (congestive) heart failure Facility Procedures CPT4 Code: 62952841 Description: [id number] -  WOUND CARE VISIT-LEV 3 EST PT Modifier: Quantity: 1 CPT4 Code: 59163846 Description: 65993 - DEB SUBQ TISSUE 20 SQ  CM/< Modifier: Quantity: 1 CPT4 Code: Description: ICD-10 Diagnosis Description L97.822 Non-pressure chronic ulcer of other part of left lower leg with fat layer Modifier: exposed Quantity: Physician Procedures CPT4 Code: 5701779 Description: WC PHYS LEVEL 3 o NEW PT Modifier: 25 Quantity: 1 CPT4 Code: Description: ICD-10 Diagnosis Description I87.2 Venous insufficiency (chronic) (peripheral) L97.822 Non-pressure chronic ulcer of other part of left lower leg with fat layer L97.322 Non-pressure chronic ulcer of left ankle with fat layer exposed I48.0  Paroxysmal atrial fibrillation Modifier: exposed Quantity: CPT4 Code: 3903009 Description: 23300 - WC PHYS SUBQ TISS 20 SQ CM Modifier: Quantity: 1 CPT4 Code: Description: ICD-10 Diagnosis Description L97.822 Non-pressure chronic ulcer of other part of left lower leg with fat layer Modifier: exposed Quantity: Electronic Signature(s) Signed: 11/13/2021 4:50:27 PM By: Worthy Keeler PA-C Previous Signature: 11/13/2021 3:53:19 PM Version By: Carlene Coria RN Entered By: Worthy Keeler on 11/13/2021 16:50:27

## 2021-11-13 NOTE — Progress Notes (Signed)
AMELIE, CARACCI ([id number]) Visit Report for 11/13/2021 Allergy List Details Patient Name: WINNIE, UMALI Date of Service: 11/13/2021 2:00 PM Medical Record Number: [id number] Patient Account Number: [id number] Date of Birth/Sex: 1935-02-17 (87 y.o. F) Treating RN: Carlene Coria Primary Care Shaylynne Lunt: [facility] Other Clinician: Referring Lizandro Spellman: [place] Treating Eriel Dunckel/Extender: Jeri Cos Weeks in Treatment: 0 Allergies Active Allergies Sulfa (Sulfonamide Antibiotics) Allergy Notes Electronic Signature(s) Signed: 11/13/2021 3:55:11 PM By: Carlene Coria RN Entered By: Carlene Coria on 11/13/2021 14:28:48 [name] ([id number]) -------------------------------------------------------------------------------- Arrival Information Details Patient Name: [name] Date of Service: 11/13/2021 2:00 PM Medical Record Number: [id number] Patient Account Number: [id number] Date of Birth/Sex: Apr 03, 1935 (86 y.o. F) Treating RN: Carlene Coria Primary Care Aleph Nickson: [facility] Other Clinician: Referring Sona Nations: [place] Treating Philamena Kramar/Extender: Skipper Cliche in Treatment: 0 Visit Information Patient Arrived: Gilford Rile Arrival Time: 14:24 Accompanied By: self Transfer Assistance: None Patient Identification Verified: Yes Secondary Verification Process Completed: Yes Patient Requires Transmission-Based No Precautions: Patient Has Alerts: Yes Patient Alerts: Patient on Blood Thinner NON compressable Electronic Signature(s) Signed: 11/13/2021 3:55:11 PM By: Carlene Coria RN Entered By: Carlene Coria on 11/13/2021 14:48:57 [name] ([id number]) -------------------------------------------------------------------------------- Clinic Level of Care Assessment Details Patient Name: [name] Date of Service: 11/13/2021 2:00 PM Medical Record Number: [id number] Patient Account Number: [id number] Date of Birth/Sex: [birth year] (86 y.o. F) Treating RN: Carlene Coria Primary Care Robyn Galati: [facility] Other Clinician: Referring Dylin Ihnen: [facility] Treating Maizey Menendez/Extender: Skipper Cliche in Treatment: 0 Clinic Level of Care Assessment Items TOOL 1 Quantity Score X - Use when EandM and Procedure is performed on INITIAL visit 1 0 ASSESSMENTS - Nursing Assessment / Reassessment X - General Physical Exam (combine w/ comprehensive assessment (listed just below) when performed on new 1 20 pt. evals) X- 1 25 Comprehensive Assessment (HX, ROS, Risk Assessments, Wounds Hx, etc.) ASSESSMENTS - Wound and Skin Assessment / Reassessment []  - Dermatologic / Skin Assessment (not related to wound area) 0 ASSESSMENTS - Ostomy and/or Continence Assessment and Care []  - Incontinence Assessment and Management 0 []  - 0 Ostomy Care Assessment and Management (repouching, etc.) PROCESS - Coordination of Care X - Simple Patient / Family Education for ongoing care 1 15 []  - 0 Complex (extensive) Patient / Family Education for ongoing care []  - 0 Staff obtains Programmer, systems, Records, Test Results / Process Orders []  - 0 Staff telephones HHA, Nursing Homes / Clarify orders / etc []  - 0 Routine Transfer to another Facility (non-emergent condition) []  - 0 Routine Hospital Admission (non-emergent condition) X- 1 15 New Admissions / Biomedical engineer / Ordering NPWT, Apligraf, etc. []  - 0 Emergency Hospital Admission (emergent condition) PROCESS - Special Needs []  - Pediatric / Minor Patient Management 0 []  - 0 Isolation Patient Management []  - 0 Hearing / Language / Visual special needs []  - 0 Assessment of Community assistance (transportation, D/C planning, etc.) []  - 0 Additional assistance / Altered mentation []  - 0 Support Surface(s) Assessment (bed, cushion, seat, etc.) INTERVENTIONS - Miscellaneous []  - External ear exam 0 []  - 0 Patient Transfer (multiple staff / Civil Service fast streamer / Similar devices) []  - 0 Simple Staple / Suture  removal (25 or less) []  - 0 Complex Staple / Suture removal (26 or more) []  - 0 Hypo/Hyperglycemic Management (do not check if billed separately) X- 1 15 Ankle / Brachial Index (ABI) - do not check if billed separately Has the patient been seen at the hospital within the last three years: Yes Total Score: 90 Level  Of Care: New/Established - Level 3 PAOLA, Charnele ([id number]) Electronic Signature(s) Signed: 11/13/2021 3:55:11 PM By: Carlene Coria RN Entered By: Carlene Coria on 11/13/2021 15:30:21 [name] ([id number]) -------------------------------------------------------------------------------- Compression Therapy Details Patient Name: [name] Date of Service: 11/13/2021 2:00 PM Medical Record Number: [id number] Patient Account Number: [id number] Date of Birth/Sex: 08-Oct-1935 (86 y.o. F) Treating RN: Carlene Coria Primary Care Lynnell Fiumara: [facility] Other Clinician: Referring Yitty Roads: [facility] Treating Leea Rambeau/Extender: Skipper Cliche in Treatment: 0 Compression Therapy Performed for Wound Assessment: Wound #1 Left,Anterior Lower Leg Performed By: Clinician Carlene Coria, RN Compression Type: Three Layer Post Procedure Diagnosis Same as Pre-procedure Electronic Signature(s) Signed: 11/13/2021 3:55:11 PM By: Carlene Coria RN Entered By: Carlene Coria on 11/13/2021 15:30:42 [name] ([id number]) -------------------------------------------------------------------------------- Compression Therapy Details Patient Name: [name] Date of Service: 11/13/2021 2:00 PM Medical Record Number: [id number] Patient Account Number: [id number] Date of Birth/Sex: February 11, 1935 (86 y.o. F) Treating RN: Carlene Coria Primary Care Abbigayle Toole: [facility] Other Clinician: Referring Shamieka Gullo: [facility] Treating Sylvanus Telford/Extender: Skipper Cliche in Treatment: 0 Compression Therapy Performed for Wound Assessment: Wound #2 Left,Lateral Ankle Performed By: Clinician  Carlene Coria, RN Compression Type: Three Layer Post Procedure Diagnosis Same as Pre-procedure Electronic Signature(s) Signed: 11/13/2021 3:55:11 PM By: Carlene Coria RN Entered By: Carlene Coria on 11/13/2021 15:30:56 [name] ([id number]) -------------------------------------------------------------------------------- Encounter Discharge Information Details Patient Name: [name] Date of Service: 11/13/2021 2:00 PM Medical Record Number: [id number] Patient Account Number: [id number] Date of Birth/Sex: 29-May-1935 (86 y.o. F) Treating RN: Carlene Coria Primary Care Laelyn Blumenthal: [facility] Other Clinician: Referring Guilford Shannahan: [facility] Treating Tinlee Navarrette/Extender: Skipper Cliche in Treatment: 0 Encounter Discharge Information Items Post Procedure Vitals Discharge Condition: Stable Temperature (F): 97.9 Ambulatory Status: Walker Pulse (bpm): 67 Discharge Destination: Home Respiratory Rate (breaths/min): 18 Transportation: Private Auto Blood Pressure (mmHg): 199/82 Accompanied By: self Schedule Follow-up Appointment: Yes Clinical Summary of Care: Patient Declined Electronic Signature(s) Signed: 11/13/2021 3:55:11 PM By: Carlene Coria RN Entered By: Carlene Coria on 11/13/2021 15:32:56 [name] ([id number]) -------------------------------------------------------------------------------- Lower Extremity Assessment Details Patient Name: [name] Date of Service: 11/13/2021 2:00 PM Medical Record Number: [id number] Patient Account Number: [id number] Date of Birth/Sex: [birth year] (86 y.o. F) Treating RN: Carlene Coria Primary Care Pallie Swigert: [facility] Other Clinician: Referring Malka Bocek: [facility] Treating Zissy Hamlett/Extender: Jeri Cos Weeks in Treatment: 0 Edema Assessment Assessed: [Left: No] [Right: No] Edema: [Left: Ye] [Right: s] Calf Left: Right: Point of Measurement: 32 cm From Medial Instep 31 cm Ankle Left: Right: Point of Measurement:  10 cm From Medial Instep 18 cm Knee To Floor Left: Right: From Medial Instep 42 cm Vascular Assessment Pulses: Dorsalis Pedis Palpable: [Left:Yes Yes] Notes non compressable Electronic Signature(s) Signed: 11/13/2021 3:55:11 PM By: Carlene Coria RN Entered By: Carlene Coria on 11/13/2021 14:48:21 [name] ([id number]) -------------------------------------------------------------------------------- Multi Wound Chart Details Patient Name: [name] Date of Service: 11/13/2021 2:00 PM Medical Record Number: [id number] Patient Account Number: [id number] Date of Birth/Sex: September 18, 1935 (86 y.o. F) Treating RN: Carlene Coria Primary Care Vertie Dibbern: [facility] Other Clinician: Referring Nyjah Schwake: [facility] Treating Shaneequa Bahner/Extender: Jeri Cos Weeks in Treatment: 0 Vital Signs Height(in): 67 Pulse(bpm): 41 Weight(lbs): 112 Blood Pressure(mmHg): 199/82 Body Mass Index(BMI): 18 Temperature(F): 97.9 Respiratory Rate(breaths/min): 18 Photos: [N/A:N/A] Wound Location: Left, Anterior Lower Leg Left, Lateral Ankle N/A Wounding Event: Trauma Trauma N/A Primary Etiology: Venous Leg Ulcer Venous Leg Ulcer N/A Comorbid History: Congestive Heart Failure, Congestive Heart Failure, N/A Hypertension Hypertension Date Acquired: 10/11/2021 10/24/2021 N/A Weeks of Treatment: 0 0 N/A  Wound Status: Open Open N/A Measurements L x W x D (cm) 1x1x0.1 4x0.6x0.2 N/A Area (cm) : 0.785 1.885 N/A Volume (cm) : 0.079 0.377 N/A Classification: Full Thickness Without Exposed Full Thickness Without Exposed N/A Support Structures Support Structures Exudate Amount: Medium Medium N/A Exudate Type: Serosanguineous Serosanguineous N/A Exudate Color: red, brown red, brown N/A Granulation Amount: Medium (34-66%) Medium (34-66%) N/A Granulation Quality: Red Pink N/A Necrotic Amount: Medium (34-66%) Medium (34-66%) N/A Exposed Structures: Fat Layer (Subcutaneous Tissue): Fascia: No N/A Yes Fat  Layer (Subcutaneous Tissue): Fascia: No No Tendon: No Tendon: No Muscle: No Muscle: No Joint: No Joint: No Bone: No Bone: No Epithelialization: None None N/A Treatment Notes Electronic Signature(s) Signed: 11/13/2021 3:55:11 PM By: Carlene Coria RN Entered By: Carlene Coria on 11/13/2021 15:10:41 [name] ([id number]) -------------------------------------------------------------------------------- [place] Details Patient Name: [name] Date of Service: 11/13/2021 2:00 PM Medical Record Number: [id number] Patient Account Number: [id number] Date of Birth/Sex: 03-Jun-1935 (86 y.o. F) Treating RN: Carlene Coria Primary Care Eustacia Urbanek: [facility] Other Clinician: Referring Brittny Spangle: [facility] Treating Dyna Figuereo/Extender: Skipper Cliche in Treatment: 0 Active Inactive Wound/Skin Impairment Nursing Diagnoses: Knowledge deficit related to ulceration/compromised skin integrity Goals: Patient/caregiver will verbalize understanding of skin care regimen Date Initiated: 11/13/2021 Target Resolution Date: 12/14/2021 Goal Status: Active Ulcer/skin breakdown will have a volume reduction of 30% by week 4 Date Initiated: 11/13/2021 Target Resolution Date: 01/11/2022 Goal Status: Active Ulcer/skin breakdown will have a volume reduction of 50% by week 8 Date Initiated: 11/13/2021 Target Resolution Date: 02/11/2022 Goal Status: Active Ulcer/skin breakdown will have a volume reduction of 80% by week 12 Date Initiated: 11/13/2021 Target Resolution Date: 03/13/2022 Goal Status: Active Ulcer/skin breakdown will heal within 14 weeks Date Initiated: 11/13/2021 Target Resolution Date: 04/13/2022 Goal Status: Active Interventions: Assess patient/caregiver ability to obtain necessary supplies Assess patient/caregiver ability to perform ulcer/skin care regimen upon admission and as needed Assess ulceration(s) every visit Notes: Electronic Signature(s) Signed: 11/13/2021 3:55:11  PM By: Carlene Coria RN Entered By: Carlene Coria on 11/13/2021 15:10:14 [name] ([id number]) -------------------------------------------------------------------------------- Pain Assessment Details Patient Name: [name] Date of Service: 11/13/2021 2:00 PM Medical Record Number: [id number] Patient Account Number: [id number] Date of Birth/Sex: [birth year] (87 y.o. F) Treating RN: Carlene Coria Primary Care Giovonnie Trettel: [facility] Other Clinician: Referring Kyera Felan: [facility] Treating Huy Majid/Extender: Skipper Cliche in Treatment: 0 Active Problems Location of Pain Severity and Description of Pain Patient Has Paino No Site Locations Pain Management and Medication Current Pain Management: Electronic Signature(s) Signed: 11/13/2021 3:55:11 PM By: Carlene Coria RN Entered By: Carlene Coria on 11/13/2021 14:26:49 [name] ([id number]) -------------------------------------------------------------------------------- Patient/Caregiver Education Details Patient Name: [name] Date of Service: 11/13/2021 2:00 PM Medical Record Number: [id number] Patient Account Number: [id number] Date of Birth/Gender: 1935/10/25 (86 y.o. F) Treating RN: Carlene Coria Primary Care Physician: [place] Co Other Clinician: Referring Physician: [place] Treating Physician/Extender: Skipper Cliche in Treatment: 0 Education Assessment Education Provided To: Patient Education Topics Provided Wound/Skin Impairment: Methods: Explain/Verbal Responses: State content correctly Electronic Signature(s) Signed: 11/13/2021 3:55:11 PM By: Carlene Coria RN Entered By: Carlene Coria on 11/13/2021 15:31:12 [name] ([id number]) -------------------------------------------------------------------------------- Wound Assessment Details Patient Name: [name] Date of Service: 11/13/2021 2:00 PM Medical Record Number: [id number] Patient Account Number: [id number] Date of Birth/Sex:  March 04, 1935 (86 y.o. F) Treating RN: Carlene Coria Primary Care Lonzo Saulter: [place] Other Clinician: Referring Jordyne Poehlman: [facility] Treating Dreshon Proffit/Extender: Jeri Cos Weeks in Treatment: 0 Wound Status Wound Number: 1 Primary Etiology: Venous Leg Ulcer Wound  Location: Left, Anterior Lower Leg Wound Status: Open Wounding Event: Trauma Comorbid History: Congestive Heart Failure, Hypertension Date Acquired: 10/11/2021 Weeks Of Treatment: 0 Clustered Wound: No Photos Wound Measurements Length: (cm) 1 Width: (cm) 1 Depth: (cm) 0.1 Area: (cm) 0.785 Volume: (cm) 0.079 % Reduction in Area: % Reduction in Volume: Epithelialization: None Tunneling: No Undermining: No Wound Description Classification: Full Thickness Without Exposed Support Structures Exudate Amount: Medium Exudate Type: Serosanguineous Exudate Color: red, brown Foul Odor After Cleansing: No Slough/Fibrino Yes Wound Bed Granulation Amount: Medium (34-66%) Exposed Structure Granulation Quality: Red Fascia Exposed: No Necrotic Amount: Medium (34-66%) Fat Layer (Subcutaneous Tissue) Exposed: Yes Necrotic Quality: Adherent Slough Tendon Exposed: No Muscle Exposed: No Joint Exposed: No Bone Exposed: No Treatment Notes Wound #1 (Lower Leg) Wound Laterality: Left, Anterior Cleanser Soap and Water Discharge Instruction: Gently cleanse wound with antibacterial soap, rinse and pat dry prior to dressing wounds Peri-Wound Care DEBORAHANN, POTEAT ([id number]) Topical Primary Dressing Hydrofera Blue Ready Transfer Foam, 2.5x2.5 (in/in) Discharge Instruction: Apply Hydrofera Blue Ready to wound bed as directed Secondary Dressing ABD Pad 5x9 (in/in) Discharge Instruction: Cover with ABD pad Secured With Compression Wrap Profore Lite LF 3 Multilayer Compression Bandaging System Discharge Instruction: Apply 3 multi-layer wrap as prescribed. Compression Stockings Add-Ons Electronic Signature(s) Signed:  11/13/2021 3:55:11 PM By: Carlene Coria RN Entered By: Carlene Coria on 11/13/2021 14:44:46 [name] ([id number]) -------------------------------------------------------------------------------- Wound Assessment Details Patient Name: [name] Date of Service: 11/13/2021 2:00 PM Medical Record Number: [id number] Patient Account Number: [id number] Date of Birth/Sex: 02-May-1935 (86 y.o. F) Treating RN: Carlene Coria Primary Care Erdine Hulen: [place] Other Clinician: Referring Katlynn Naser: [place] Treating Meral Geissinger/Extender: Jeri Cos Weeks in Treatment: 0 Wound Status Wound Number: 2 Primary Etiology: Venous Leg Ulcer Wound Location: Left, Lateral Ankle Wound Status: Open Wounding Event: Trauma Comorbid History: Congestive Heart Failure, Hypertension Date Acquired: 10/24/2021 Weeks Of Treatment: 0 Clustered Wound: No Photos Wound Measurements Length: (cm) 4 Width: (cm) 0.6 Depth: (cm) 0.2 Area: (cm) 1.885 Volume: (cm) 0.377 % Reduction in Area: % Reduction in Volume: Epithelialization: None Tunneling: No Undermining: No Wound Description Classification: Full Thickness Without Exposed Support Structures Exudate Amount: Medium Exudate Type: Serosanguineous Exudate Color: red, brown Foul Odor After Cleansing: No Slough/Fibrino Yes Wound Bed Granulation Amount: Medium (34-66%) Exposed Structure Granulation Quality: Pink Fascia Exposed: No Necrotic Amount: Medium (34-66%) Fat Layer (Subcutaneous Tissue) Exposed: No Necrotic Quality: Adherent Slough Tendon Exposed: No Muscle Exposed: No Joint Exposed: No Bone Exposed: No Treatment Notes Wound #2 (Ankle) Wound Laterality: Left, Lateral Cleanser Soap and Water Discharge Instruction: Gently cleanse wound with antibacterial soap, rinse and pat dry prior to dressing wounds Peri-Wound Care ASHANTI, RATTI ([id number]) Topical Primary Dressing Hydrofera Blue Ready Transfer Foam, 2.5x2.5 (in/in) Discharge  Instruction: Apply Hydrofera Blue Ready to wound bed as directed Secondary Dressing ABD Pad 5x9 (in/in) Discharge Instruction: Cover with ABD pad Secured With Compression Wrap Profore Lite LF 3 Multilayer Compression Bandaging System Discharge Instruction: Apply 3 multi-layer wrap as prescribed. Compression Stockings Add-Ons Electronic Signature(s) Signed: 11/13/2021 3:55:11 PM By: Carlene Coria RN Entered By: Carlene Coria on 11/13/2021 [place], [place] ([id number]) -------------------------------------------------------------------------------- Vitals Details Patient Name: [name] Date of Service: 11/13/2021 2:00 PM Medical Record Number: [id number] Patient Account Number: [id number] Date of Birth/Sex: December 27, 1934 (86 y.o. F) Treating RN: Carlene Coria Primary Care Serenna Deroy: [facility] Other Clinician: Referring Revecca Nachtigal: [place] Treating Romonda Parker/Extender: Skipper Cliche in Treatment: 0 Vital Signs Time Taken: 14:26 Temperature (F): 97.9 Height (in): 67 Pulse (bpm): 67  Source: Stated Respiratory Rate (breaths/min): 18 Weight (lbs): 112 Blood Pressure (mmHg): 199/82 Source: Stated Reference Range: 80 - 120 mg / dl Body Mass Index (BMI): 17.5 Electronic Signature(s) Signed: 11/13/2021 3:55:11 PM By: Carlene Coria RN Entered By: Carlene Coria on 11/13/2021 14:27:38

## 2021-11-15 ENCOUNTER — Other Ambulatory Visit: Payer: Self-pay | Admitting: Family

## 2021-11-15 DIAGNOSIS — I10 Essential (primary) hypertension: Secondary | ICD-10-CM

## 2021-11-15 DIAGNOSIS — I25118 Atherosclerotic heart disease of native coronary artery with other forms of angina pectoris: Secondary | ICD-10-CM

## 2021-11-19 ENCOUNTER — Other Ambulatory Visit: Payer: Self-pay

## 2021-11-19 ENCOUNTER — Other Ambulatory Visit: Payer: Self-pay | Admitting: Family

## 2021-11-19 ENCOUNTER — Encounter: Payer: Medicare Other | Admitting: Physician Assistant

## 2021-11-19 DIAGNOSIS — I25118 Atherosclerotic heart disease of native coronary artery with other forms of angina pectoris: Secondary | ICD-10-CM

## 2021-11-19 DIAGNOSIS — I5042 Chronic combined systolic (congestive) and diastolic (congestive) heart failure: Secondary | ICD-10-CM | POA: Diagnosis not present

## 2021-11-19 DIAGNOSIS — I110 Hypertensive heart disease with heart failure: Secondary | ICD-10-CM | POA: Diagnosis not present

## 2021-11-19 DIAGNOSIS — Z87891 Personal history of nicotine dependence: Secondary | ICD-10-CM | POA: Diagnosis not present

## 2021-11-19 DIAGNOSIS — L97822 Non-pressure chronic ulcer of other part of left lower leg with fat layer exposed: Secondary | ICD-10-CM | POA: Diagnosis not present

## 2021-11-19 DIAGNOSIS — I872 Venous insufficiency (chronic) (peripheral): Secondary | ICD-10-CM | POA: Diagnosis not present

## 2021-11-19 DIAGNOSIS — I10 Essential (primary) hypertension: Secondary | ICD-10-CM | POA: Diagnosis not present

## 2021-11-19 DIAGNOSIS — Z7901 Long term (current) use of anticoagulants: Secondary | ICD-10-CM | POA: Diagnosis not present

## 2021-11-19 DIAGNOSIS — I252 Old myocardial infarction: Secondary | ICD-10-CM | POA: Diagnosis not present

## 2021-11-19 DIAGNOSIS — L97322 Non-pressure chronic ulcer of left ankle with fat layer exposed: Secondary | ICD-10-CM | POA: Diagnosis not present

## 2021-11-19 DIAGNOSIS — I480 Paroxysmal atrial fibrillation: Secondary | ICD-10-CM | POA: Diagnosis not present

## 2021-11-19 NOTE — Telephone Encounter (Signed)
Please disregard request-patient canceled last appointment-changed providers

## 2021-11-19 NOTE — Telephone Encounter (Signed)
Pt needs appointment with Dr. Fletcher Anon or APP in [place], last seen 2021. Thank you!

## 2021-11-19 NOTE — Progress Notes (Signed)
[name] [name], [name] [name] ([id number]) Visit Report for 11/19/2021 Arrival Information Details Patient Name: [name] [name], [name] [name] Date of Service: 11/19/2021 9:00 AM Medical Record Number: [id number] Patient Account Number: [id number] Date of Birth/Sex: [birth year] (87 y.o. F) Treating RN: [name] Primary Care Emett Stapel: [place] Other Clinician: Referring Demyah Smyre: [place] Treating Yoltzin Barg/Extender: Skipper Cliche in Treatment: 0 Visit Information History Since Last Visit Added or deleted any medications: No Patient Arrived: [name] [name] Any new allergies or adverse reactions: No Arrival Time: 08:38 Had a fall or experienced change in No Accompanied By: self activities of daily living that may affect Transfer Assistance: None risk of falls: Patient Identification Verified: Yes Hospitalized since last visit: No Secondary Verification Process Completed: Yes Has Dressing in Place as Prescribed: Yes Patient Requires Transmission-Based No Has Compression in Place as Prescribed: Yes Precautions: Pain Present Now: Yes Patient Has Alerts: Yes Patient Alerts: Patient on Blood Thinner NON compressable Electronic Signature(s) Signed: 11/19/2021 9:27:28 AM By: [name] Entered By: [name] on 11/19/2021 08:39:24 [name] [name] ([id number]) -------------------------------------------------------------------------------- Clinic Level of Care Assessment Details Patient Name: [name] [name] Date of Service: 11/19/2021 9:00 AM Medical Record Number: [id number] Patient Account Number: [id number] Date of Birth/Sex: 06/07/1935 (86 y.o. F) Treating RN: [name] Primary Care Harlym Gehling: [place] Other Clinician: Referring Marco Raper: [place] Treating Alphons Burgert/Extender: Skipper Cliche in Treatment: 0 Clinic Level of Care Assessment Items TOOL 4 Quantity Score X - Use when only an EandM is performed on FOLLOW-UP visit 1 0 ASSESSMENTS - Nursing Assessment /  Reassessment []  - Reassessment of Co-morbidities (includes updates in patient status) 0 []  - 0 Reassessment of Adherence to Treatment Plan ASSESSMENTS - Wound and Skin Assessment / Reassessment []  - Simple Wound Assessment / Reassessment - one wound 0 X- 2 5 Complex Wound Assessment / Reassessment - multiple wounds []  - 0 Dermatologic / Skin Assessment (not related to wound area) ASSESSMENTS - Focused Assessment []  - Circumferential Edema Measurements - multi extremities 0 []  - 0 Nutritional Assessment / Counseling / Intervention []  - 0 Lower Extremity Assessment (monofilament, tuning fork, pulses) []  - 0 Peripheral Arterial Disease Assessment (using hand held doppler) ASSESSMENTS - Ostomy and/or Continence Assessment and Care []  - Incontinence Assessment and Management 0 []  - 0 Ostomy Care Assessment and Management (repouching, etc.) PROCESS - Coordination of Care X - Simple Patient / Family Education for ongoing care 1 15 []  - 0 Complex (extensive) Patient / Family Education for ongoing care []  - 0 Staff obtains Programmer, systems, Records, Test Results / Process Orders []  - 0 Staff telephones HHA, Nursing Homes / Clarify orders / etc []  - 0 Routine Transfer to another Facility (non-emergent condition) []  - 0 Routine Hospital Admission (non-emergent condition) []  - 0 New Admissions / Biomedical engineer / Ordering NPWT, Apligraf, etc. []  - 0 Emergency Hospital Admission (emergent condition) X- 1 10 Simple Discharge Coordination []  - 0 Complex (extensive) Discharge Coordination PROCESS - Special Needs []  - Pediatric / Minor Patient Management 0 []  - 0 Isolation Patient Management []  - 0 Hearing / Language / Visual special needs []  - 0 Assessment of Community assistance (transportation, D/C planning, etc.) []  - 0 Additional assistance / Altered mentation []  - 0 Support Surface(s) Assessment (bed, cushion, seat, etc.) INTERVENTIONS - Wound Cleansing /  Measurement [name], [name] ([id number]) []  - 0 Simple Wound Cleansing - one wound X- 2 5 Complex Wound Cleansing - multiple wounds []  - 0 Wound Imaging (photographs - any number of wounds) []  - 0 Wound Tracing (instead of  photographs) []  - 0 Simple Wound Measurement - one wound X- 2 5 Complex Wound Measurement - multiple wounds INTERVENTIONS - Wound Dressings X - Small Wound Dressing one or multiple wounds 2 10 []  - 0 Medium Wound Dressing one or multiple wounds []  - 0 Large Wound Dressing one or multiple wounds []  - 0 Application of Medications - topical []  - 0 Application of Medications - injection INTERVENTIONS - Miscellaneous []  - External ear exam 0 []  - 0 Specimen Collection (cultures, biopsies, blood, body fluids, etc.) []  - 0 Specimen(s) / Culture(s) sent or taken to Lab for analysis []  - 0 Patient Transfer (multiple staff / Civil Service fast streamer / Similar devices) []  - 0 Simple Staple / Suture removal (25 or less) []  - 0 Complex Staple / Suture removal (26 or more) []  - 0 Hypo / Hyperglycemic Management (close monitor of Blood Glucose) []  - 0 Ankle / Brachial Index (ABI) - do not check if billed separately X- 1 5 Vital Signs Has the patient been seen at the hospital within the last three years: Yes Total Score: 80 Level Of Care: New/Established - Level 3 Electronic Signature(s) Signed: 11/19/2021 9:27:28 AM By: [name] Entered By: [name] on 11/19/2021 09:09:55 [name] [name] ([id number]) -------------------------------------------------------------------------------- Encounter Discharge Information Details Patient Name: [name] [name] Date of Service: 11/19/2021 9:00 AM Medical Record Number: [id number] Patient Account Number: [id number] Date of Birth/Sex: 23-Nov-1934 (86 y.o. F) Treating RN: [name] Primary Care Laurieann Friddle: [place] Other Clinician: Referring Nadalyn Deringer: [place] Treating Jakiera Ehler/Extender: Skipper Cliche in  Treatment: 0 Encounter Discharge Information Items Discharge Condition: Stable Ambulatory Status: Walker Discharge Destination: Home Transportation: Private Auto Accompanied By: self Schedule Follow-up Appointment: Yes Clinical Summary of Care: Electronic Signature(s) Signed: 11/19/2021 9:27:28 AM By: [name] Entered By: [name] on 11/19/2021 09:22:51 [name] [name] ([id number]) -------------------------------------------------------------------------------- Lower Extremity Assessment Details Patient Name: [name] [name] Date of Service: 11/19/2021 9:00 AM Medical Record Number: [id number] Patient Account Number: [id number] Date of Birth/Sex: October 20, 1935 (86 y.o. F) Treating RN: [name] Primary Care Maurice Ramseur: [place] Other Clinician: Referring Raylinn Kosar: [place] Treating Danija Gosa/Extender: Jeri Cos Weeks in Treatment: 0 Edema Assessment Assessed: [Left: No] [Right: No] Edema: [Left: N] [Right: o] Calf Left: Right: Point of Measurement: 32 cm From Medial Instep 30.4 cm Ankle Left: Right: Point of Measurement: 10 cm From Medial Instep 20 cm Vascular Assessment Pulses: Dorsalis Pedis Palpable: [Left:Yes] Posterior Tibial Palpable: [Left:Yes] Electronic Signature(s) Signed: 11/19/2021 9:27:28 AM By: [name] Entered By: [name] on 11/19/2021 08:55:33 [name] [name] ([id number]) -------------------------------------------------------------------------------- Multi Wound Chart Details Patient Name: [name] [name] Date of Service: 11/19/2021 9:00 AM Medical Record Number: [id number] Patient Account Number: [id number] Date of Birth/Sex: Mar 10, 1935 (86 y.o. F) Treating RN: [name] Primary Care Moreen Piggott: [place] Other Clinician: Referring Carlos Heber: [place] Treating Delance Weide/Extender: Jeri Cos Weeks in Treatment: 0 Vital Signs Height(in): 59 Pulse(bpm): 40 Weight(lbs): 112 Blood Pressure(mmHg):  214/85 Body Mass Index(BMI): 18 Temperature(F): 97.8 Respiratory Rate(breaths/min): 18 Photos: [N/A:N/A] Wound Location: Left, Anterior Lower Leg Left, Lateral Ankle N/A Wounding Event: Trauma Trauma N/A Primary Etiology: Venous Leg Ulcer Venous Leg Ulcer N/A Comorbid History: Congestive Heart Failure, Congestive Heart Failure, N/A Hypertension Hypertension Date Acquired: 10/11/2021 10/24/2021 N/A Weeks of Treatment: 0 0 N/A Wound Status: Open Open N/A Measurements L x W x D (cm) 1.1x1.5x0.2 3.4x0.4x0.2 N/A Area (cm) : 1.296 1.068 N/A Volume (cm) : 0.259 0.214 N/A % Reduction in Area: -65.10% 43.30% N/A % Reduction in Volume: -227.80% 43.20% N/A Classification: Full Thickness  Without Exposed Full Thickness Without Exposed N/A Support Structures Support Structures Exudate Amount: Medium Medium N/A Exudate Type: Serosanguineous Serosanguineous N/A Exudate Color: red, brown red, brown N/A Granulation Amount: Large (67-100%) Medium (34-66%) N/A Granulation Quality: Red, Hyper-granulation Pink N/A Necrotic Amount: Small (1-33%) Medium (34-66%) N/A Exposed Structures: Fat Layer (Subcutaneous Tissue): Fascia: No N/A Yes Fat Layer (Subcutaneous Tissue): Fascia: No No Tendon: No Tendon: No Muscle: No Muscle: No Joint: No Joint: No Bone: No Bone: No Epithelialization: None None N/A Treatment Notes Electronic Signature(s) Signed: 11/19/2021 9:27:28 AM By: [name] Entered By: [name] on 11/19/2021 09:04:45 [name] [name] ([id number]) -------------------------------------------------------------------------------- [place] Details Patient Name: [name] [name] Date of Service: 11/19/2021 9:00 AM Medical Record Number: [id number] Patient Account Number: [id number] Date of Birth/Sex: May 01, 1935 (86 y.o. F) Treating RN: [name] Primary Care Ed Rayson: [place] Other Clinician: Referring Remo Kirschenmann: [place] Treating  Pieter Fooks/Extender: Skipper Cliche in Treatment: 0 Active Inactive Wound/Skin Impairment Nursing Diagnoses: Knowledge deficit related to ulceration/compromised skin integrity Goals: Patient/caregiver will verbalize understanding of skin care regimen Date Initiated: 11/13/2021 Target Resolution Date: 12/14/2021 Goal Status: Active Ulcer/skin breakdown will have a volume reduction of 30% by week 4 Date Initiated: 11/13/2021 Target Resolution Date: 01/11/2022 Goal Status: Active Ulcer/skin breakdown will have a volume reduction of 50% by week 8 Date Initiated: 11/13/2021 Target Resolution Date: 02/11/2022 Goal Status: Active Ulcer/skin breakdown will have a volume reduction of 80% by week 12 Date Initiated: 11/13/2021 Target Resolution Date: 03/13/2022 Goal Status: Active Ulcer/skin breakdown will heal within 14 weeks Date Initiated: 11/13/2021 Target Resolution Date: 04/13/2022 Goal Status: Active Interventions: Assess patient/caregiver ability to obtain necessary supplies Assess patient/caregiver ability to perform ulcer/skin care regimen upon admission and as needed Assess ulceration(s) every visit Notes: Electronic Signature(s) Signed: 11/19/2021 9:27:28 AM By: [name] Entered By: [name] on 11/19/2021 [name] [name], [name] [name] ([id number]) -------------------------------------------------------------------------------- Pain Assessment Details Patient Name: [name] [name] Date of Service: 11/19/2021 9:00 AM Medical Record Number: [id number] Patient Account Number: [id number] Date of Birth/Sex: Apr 19, 1935 (87 y.o. F) Treating RN: [name] Primary Care [place]: [place] [employer] Other Clinician: Referring Erminie Foulks: [place] [employer] Treating Kadar Chance/Extender: Skipper Cliche in Treatment: 0 Active Problems Location of Pain Severity and Description of Pain Patient Has Paino Yes Site Locations Rate the pain. Current Pain Level: 1 Pain Management and Medication Current  Pain Management: Notes pt states pain in left leg Electronic Signature(s) Signed: 11/19/2021 9:27:28 AM By: [name] Entered By: [name] on 11/19/2021 08:41:52 [name] [name] ([id number]) -------------------------------------------------------------------------------- Patient/Caregiver Education Details Patient Name: [name] [name] Date of Service: 11/19/2021 9:00 AM Medical Record Number: [id number] Patient Account Number: [id number] Date of Birth/Gender: [birth year] (86 y.o. F) Treating RN: [name] Primary Care Physician: [place] Other Clinician: Referring Physician: [place] Treating Physician/Extender: Skipper Cliche in Treatment: 0 Education Assessment Education Provided To: Patient Education Topics Provided Wound/Skin Impairment: Handouts: Caring for Your Ulcer Methods: Explain/Verbal Responses: State content correctly Electronic Signature(s) Signed: 11/19/2021 9:27:28 AM By: [name] Entered By: [name] on 11/19/2021 09:22:13 [name] [name] ([id number]) -------------------------------------------------------------------------------- Wound Assessment Details Patient Name: [name] [name] Date of Service: 11/19/2021 9:00 AM Medical Record Number: [id number] Patient Account Number: [id number] Date of Birth/Sex: November 30, 1934 (86 y.o. F) Treating RN: [name] Primary Care Brenlee Koskela: [place] Other Clinician: Referring Genesee Nase: [place] [employer] Treating Tailor Westfall/Extender: Jeri Cos Weeks in Treatment: 0 Wound Status Wound Number: 1 Primary Etiology: Venous Leg Ulcer Wound Location: Left, Anterior Lower Leg Wound Status: Open Wounding Event: Trauma Comorbid History: Congestive Heart  Failure, Hypertension Date Acquired: 10/11/2021 Weeks Of Treatment: 0 Clustered Wound: No Photos Wound Measurements Length: (cm) 1.1 Width: (cm) 1.5 Depth: (cm) 0.2 Area: (cm) 1.296 Volume: (cm) 0.259 % Reduction in Area:  -65.1% % Reduction in Volume: -227.8% Epithelialization: None Tunneling: No Undermining: No Wound Description Classification: Full Thickness Without Exposed Support Structures Exudate Amount: Medium Exudate Type: Serosanguineous Exudate Color: red, brown Foul Odor After Cleansing: No Slough/Fibrino Yes Wound Bed Granulation Amount: Large (67-100%) Exposed Structure Granulation Quality: Red, Hyper-granulation Fascia Exposed: No Necrotic Amount: Small (1-33%) Fat Layer (Subcutaneous Tissue) Exposed: Yes Necrotic Quality: Adherent Slough Tendon Exposed: No Muscle Exposed: No Joint Exposed: No Bone Exposed: No Treatment Notes Wound #1 (Lower Leg) Wound Laterality: Left, Anterior Cleanser Soap and Water Discharge Instruction: Gently cleanse wound with antibacterial soap, rinse and pat dry prior to dressing wounds Peri-Wound Care VELISA, REGNIER ([id number]) Topical Primary Dressing Hydrofera Blue Ready Transfer Foam, 2.5x2.5 (in/in) Discharge Instruction: Apply Hydrofera Blue Ready to wound bed as directed Secondary Dressing ABD Pad 5x9 (in/in) Discharge Instruction: Cover with ABD pad Secured With Compression Wrap Profore Lite LF 3 Multilayer Compression Bandaging System Discharge Instruction: Apply 3 multi-layer wrap as prescribed. Compression Stockings Add-Ons Electronic Signature(s) Signed: 11/19/2021 9:27:28 AM By: [name] Entered By: [name] on 11/19/2021 08:54:35 [name] [name] ([id number]) -------------------------------------------------------------------------------- Wound Assessment Details Patient Name: [name] [name] Date of Service: 11/19/2021 9:00 AM Medical Record Number: [id number] Patient Account Number: [id number] Date of Birth/Sex: 02-Feb-1935 (86 y.o. F) Treating RN: [name] Primary Care Shanika Levings: [place] Other Clinician: Referring Lavance Beazer: [place] Treating Nidya Bouyer/Extender: Jeri Cos Weeks in Treatment:  0 Wound Status Wound Number: 2 Primary Etiology: Venous Leg Ulcer Wound Location: Left, Lateral Ankle Wound Status: Open Wounding Event: Trauma Comorbid History: Congestive Heart Failure, Hypertension Date Acquired: 10/24/2021 Weeks Of Treatment: 0 Clustered Wound: No Photos Wound Measurements Length: (cm) 3.4 Width: (cm) 0.4 Depth: (cm) 0.2 Area: (cm) 1.068 Volume: (cm) 0.214 % Reduction in Area: 43.3% % Reduction in Volume: 43.2% Epithelialization: None Tunneling: No Undermining: No Wound Description Classification: Full Thickness Without Exposed Support Structures Exudate Amount: Medium Exudate Type: Serosanguineous Exudate Color: red, brown Foul Odor After Cleansing: No Slough/Fibrino Yes Wound Bed Granulation Amount: Medium (34-66%) Exposed Structure Granulation Quality: Pink Fascia Exposed: No Necrotic Amount: Medium (34-66%) Fat Layer (Subcutaneous Tissue) Exposed: No Necrotic Quality: Adherent Slough Tendon Exposed: No Muscle Exposed: No Joint Exposed: No Bone Exposed: No Treatment Notes Wound #2 (Ankle) Wound Laterality: Left, Lateral Cleanser Soap and Water Discharge Instruction: Gently cleanse wound with antibacterial soap, rinse and pat dry prior to dressing wounds Peri-Wound Care PRIYANA, MCCAREY ([id number]) Topical Primary Dressing Hydrofera Blue Ready Transfer Foam, 2.5x2.5 (in/in) Discharge Instruction: Apply Hydrofera Blue Ready to wound bed as directed Secondary Dressing ABD Pad 5x9 (in/in) Discharge Instruction: Cover with ABD pad Secured With Compression Wrap Profore Lite LF 3 Multilayer Compression Bandaging System Discharge Instruction: Apply 3 multi-layer wrap as prescribed. Compression Stockings Add-Ons Electronic Signature(s) Signed: 11/19/2021 9:27:28 AM By: [name] Entered By: [name] on 11/19/2021 08:54:17 [name] [name]  ([id number]) -------------------------------------------------------------------------------- Vitals Details Patient Name: [name] [name] Date of Service: 11/19/2021 9:00 AM Medical Record Number: [id number] Patient Account Number: [id number] Date of Birth/Sex: 1935-04-10 (86 y.o. F) Treating RN: [name] Primary Care Zhanna Melin: [place] Other Clinician: Referring Paytience Bures: [place] Treating Vora Clover/Extender: Skipper Cliche in Treatment: 0 Vital Signs Time Taken: 08:39 Temperature (F): 97.8 Height (in): 67 Pulse (bpm): 54 Weight (lbs): 112 Respiratory Rate (breaths/min): 18 Body Mass Index (BMI):  17.5 Blood Pressure (mmHg): 214/85 Reference Range: 80 - 120 mg / dl Notes PA Stone aware on increased BP, patient states did not take BP medication this am, patient asymptomatic. Electronic Signature(s) Signed: 11/19/2021 9:27:28 AM By: [name] Entered By: [name] on 11/19/2021 08:58:30

## 2021-11-19 NOTE — Progress Notes (Addendum)
HAILI, DONOFRIO ([id number]) Visit Report for 11/19/2021 Chief Complaint Document Details Patient Name: [name] [name], [name] [name] Date of Service: 11/19/2021 9:00 AM Medical Record Number: [id number] Patient Account Number: [id number] Date of Birth/Sex: Oct 26, 1935 (87 y.o. F) Treating RN: [name] Primary Care Provider: Georgian Co Other Clinician: Referring Provider: Georgian Co Treating Provider/Extender: Skipper Cliche in Treatment: 0 Information Obtained from: Patient Chief Complaint Left leg and left ankle ulcers Electronic Signature(s) Signed: 11/19/2021 9:00:31 AM By: Worthy Keeler PA-C Entered By: Worthy Keeler on 11/19/2021 09:00:31 [name] [name] ([id number]) -------------------------------------------------------------------------------- HPI Details Patient Name: [name] [name] Date of Service: 11/19/2021 9:00 AM Medical Record Number: [id number] Patient Account Number: [id number] Date of Birth/Sex: 09/22/1935 (86 y.o. F) Treating RN: [name] Primary Care Provider: Georgian Co Other Clinician: Referring Provider: Georgian Co Treating Provider/Extender: Skipper Cliche in Treatment: 0 History of Present Illness HPI Description: 11/13/2021 upon evaluation today patient presents for initial inspection here in our clinic concerning issues that she has been having with a wound over her left leg at both the leg and ankle location currently. This began somewhere around 2-3 maybe even 4 weeks ago based on what she is telling us today. Nonetheless I do believe that she has a history of chronic venous insufficiency, she also has atrial fibrillation, hypertension, and congestive heart failure. Her leg has been swelling quite a bit here. We noted ABIs to be noncompressible but she does seem to have a pretty good pulse noted on Doppler as well as by palpation. She tells me that the wound on her shin occurred as a result of accidentally striking her self with a bag of  groceries it was not even that hard. The one on the ankle which she attributes to shoes that her sister got her that she feels like "somebody wore before she got on and cause this issue". 11/19/2021 upon evaluation today patient appears to be doing well with regard to her wound. I am actually very pleased with where things stand today. I do not see any signs of active infection at this time which is great news and overall I think that she is making good progress here. Her blood pressure is still up again today however. She tells me she was very nervous driving and she is not used to this area and was afraid of there being fog nonetheless I still think this is something that her primary keeps a close track on they see her every 4 weeks especially with her previous heart attack for which she is still on blood thinners as well. Electronic Signature(s) Signed: 11/19/2021 9:25:42 AM By: Worthy Keeler PA-C Entered By: Worthy Keeler on 11/19/2021 09:25:42 [name] [name] ([id number]) -------------------------------------------------------------------------------- Physical Exam Details Patient Name: [name] [name] Date of Service: 11/19/2021 9:00 AM Medical Record Number: [id number] Patient Account Number: [id number] Date of Birth/Sex: 1935-05-09 (86 y.o. F) Treating RN: [name] Primary Care Provider: Georgian Co Other Clinician: Referring Provider: Georgian Co Treating Provider/Extender: Jeri Cos Weeks in Treatment: 0 Constitutional Well-nourished and well-hydrated in no acute distress. Respiratory normal breathing without difficulty. Psychiatric this patient is able to make decisions and demonstrates good insight into disease process. Alert and Oriented x 3. pleasant and cooperative. Notes Upon inspection patient's wound bed actually showed signs of good granulation and epithelization at this point. Fortunately I do not see any evidence of active infection locally or systemically  at this time. Overall her wounds actually seem to be doing significantly better which is great news. Electronic Signature(s) Signed:  11/19/2021 9:54:25 AM By: Worthy Keeler PA-C Entered By: Worthy Keeler on 11/19/2021 09:54:25 [name] [name] ([id number]) -------------------------------------------------------------------------------- Physician Orders Details Patient Name: [name] [name] Date of Service: 11/19/2021 9:00 AM Medical Record Number: [id number] Patient Account Number: [id number] Date of Birth/Sex: 03-Oct-1935 (86 y.o. F) Treating RN: [name] Primary Care Provider: Georgian Co Other Clinician: Referring Provider: Georgian Co Treating Provider/Extender: Skipper Cliche in Treatment: 0 Verbal / Phone Orders: No Diagnosis Coding ICD-10 Coding Code Description I87.2 Venous insufficiency (chronic) (peripheral) L97.822 Non-pressure chronic ulcer of other part of left lower leg with fat layer exposed L97.322 Non-pressure chronic ulcer of left ankle with fat layer exposed I48.0 Paroxysmal atrial fibrillation I10 Essential (primary) hypertension I50.42 Chronic combined systolic (congestive) and diastolic (congestive) heart failure Follow-up Appointments o Return Appointment in 1 week. Bathing/ Shower/ Hygiene o May shower with wound dressing protected with water repellent cover or cast protector. o No tub bath. Anesthetic (Use 'Patient Medications' Section for Anesthetic Order Entry) o Lidocaine applied to wound bed Edema Control - Lymphedema / Segmental Compressive Device / Other o Optional: One layer of unna paste to top of compression wrap (to act as an anchor). o Elevate, Exercise Daily and Avoid Standing for Long Periods of Time. o Elevate legs to the level of the heart and pump ankles as often as possible o Elevate leg(s) parallel to the floor when sitting. Wound Treatment Wound #1 - Lower Leg Wound Laterality: Left, Anterior Cleanser:  Soap and Water 1 x Per Week/30 Days Discharge Instructions: Gently cleanse wound with antibacterial soap, rinse and pat dry prior to dressing wounds Primary Dressing: Hydrofera Blue Ready Transfer Foam, 2.5x2.5 (in/in) 1 x Per Week/30 Days Discharge Instructions: Apply Hydrofera Blue Ready to wound bed as directed Secondary Dressing: ABD Pad 5x9 (in/in) 1 x Per Week/30 Days Discharge Instructions: Cover with ABD pad Compression Wrap: Profore Lite LF 3 Multilayer Compression Bandaging System 1 x Per Week/30 Days Discharge Instructions: Apply 3 multi-layer wrap as prescribed. Wound #2 - Ankle Wound Laterality: Left, Lateral Cleanser: Soap and Water 1 x Per Week/30 Days Discharge Instructions: Gently cleanse wound with antibacterial soap, rinse and pat dry prior to dressing wounds Primary Dressing: Hydrofera Blue Ready Transfer Foam, 2.5x2.5 (in/in) 1 x Per Week/30 Days Discharge Instructions: Apply Hydrofera Blue Ready to wound bed as directed Secondary Dressing: ABD Pad 5x9 (in/in) 1 x Per Week/30 Days Discharge Instructions: Cover with ABD pad Compression Wrap: Profore Lite LF 3 Multilayer Compression Bandaging System 1 x Per Week/30 Days Discharge Instructions: Apply 3 multi-layer wrap as prescribed. TAMKIA, TEMPLES ([id number]) Electronic Signature(s) Signed: 11/19/2021 9:27:28 AM By: [name] Signed: 11/19/2021 3:33:26 PM By: Worthy Keeler PA-C Entered By: [name] on 11/19/2021 09:09:16 [name] [name] ([id number]) -------------------------------------------------------------------------------- Problem List Details Patient Name: [name] [name] Date of Service: 11/19/2021 9:00 AM Medical Record Number: [id number] Patient Account Number: [id number] Date of Birth/Sex: 31-Oct-1935 (86 y.o. F) Treating RN: [name] Primary Care Provider: Georgian Co Other Clinician: Referring Provider: Georgian Co Treating Provider/Extender: Skipper Cliche in Treatment: 0 Active  Problems ICD-10 Encounter Code Description Active Date MDM Diagnosis I87.2 Venous insufficiency (chronic) (peripheral) 11/13/2021 No Yes L97.822 Non-pressure chronic ulcer of other part of left lower leg with fat layer 11/13/2021 No Yes exposed L97.322 Non-pressure chronic ulcer of left ankle with fat layer exposed 11/13/2021 No Yes I48.0 Paroxysmal atrial fibrillation 11/13/2021 No Yes I10 Essential (primary) hypertension 11/13/2021 No Yes I50.42 Chronic combined systolic (congestive) and diastolic (congestive) heart 11/13/2021 No Yes failure  Inactive Problems Resolved Problems Electronic Signature(s) Signed: 11/19/2021 9:00:24 AM By: Worthy Keeler PA-C Entered By: Worthy Keeler on 11/19/2021 09:00:23 [name] [name] ([id number]) -------------------------------------------------------------------------------- Progress Note Details Patient Name: [name] [name] Date of Service: 11/19/2021 9:00 AM Medical Record Number: [id number] Patient Account Number: [id number] Date of Birth/Sex: 1935-03-23 (86 y.o. F) Treating RN: [name] Primary Care Provider: Georgian Co Other Clinician: Referring Provider: Georgian Co Treating Provider/Extender: Skipper Cliche in Treatment: 0 Subjective Chief Complaint Information obtained from Patient Left leg and left ankle ulcers History of Present Illness (HPI) 11/13/2021 upon evaluation today patient presents for initial inspection here in our clinic concerning issues that she has been having with a wound over her left leg at both the leg and ankle location currently. This began somewhere around 2-3 maybe even 4 weeks ago based on what she is telling us today. Nonetheless I do believe that she has a history of chronic venous insufficiency, she also has atrial fibrillation, hypertension, and congestive heart failure. Her leg has been swelling quite a bit here. We noted ABIs to be noncompressible but she does seem to have a pretty good pulse noted on  Doppler as well as by palpation. She tells me that the wound on her shin occurred as a result of accidentally striking her self with a bag of groceries it was not even that hard. The one on the ankle which she attributes to shoes that her sister got her that she feels like "somebody wore before she got on and cause this issue". 11/19/2021 upon evaluation today patient appears to be doing well with regard to her wound. I am actually very pleased with where things stand today. I do not see any signs of active infection at this time which is great news and overall I think that she is making good progress here. Her blood pressure is still up again today however. She tells me she was very nervous driving and she is not used to this area and was afraid of there being fog nonetheless I still think this is something that her primary keeps a close track on they see her every 4 weeks especially with her previous heart attack for which she is still on blood thinners as well. Objective Constitutional Well-nourished and well-hydrated in no acute distress. Vitals Time Taken: 8:39 AM, Height: 67 in, Weight: 112 lbs, BMI: 17.5, Temperature: 97.8 F, Pulse: 54 bpm, Respiratory Rate: 18 breaths/min, Blood Pressure: 214/85 mmHg. General Notes: PA Stone aware on increased BP, patient states did not take BP medication this am, patient asymptomatic. Respiratory normal breathing without difficulty. Psychiatric this patient is able to make decisions and demonstrates good insight into disease process. Alert and Oriented x 3. pleasant and cooperative. General Notes: Upon inspection patient's wound bed actually showed signs of good granulation and epithelization at this point. Fortunately I do not see any evidence of active infection locally or systemically at this time. Overall her wounds actually seem to be doing significantly better which is great news. Integumentary (Hair, Skin) Wound #1 status is Open. Original cause  of wound was Trauma. The date acquired was: 10/11/2021. The wound is located on the Left,Anterior Lower Leg. The wound measures 1.1cm length x 1.5cm width x 0.2cm depth; 1.296cm^2 area and 0.259cm^3 volume. There is Fat Layer (Subcutaneous Tissue) exposed. There is no tunneling or undermining noted. There is a medium amount of serosanguineous drainage noted. There is large (67-100%) red, hyper - granulation within the wound bed. There is a small (  1-33%) amount of necrotic tissue within the wound bed including Adherent Slough. Wound #2 status is Open. Original cause of wound was Trauma. The date acquired was: 10/24/2021. The wound is located on the Left,Lateral Ankle. The wound measures 3.4cm length x 0.4cm width x 0.2cm depth; 1.068cm^2 area and 0.214cm^3 volume. There is no tunneling or undermining noted. There is a medium amount of serosanguineous drainage noted. There is medium (34-66%) pink granulation within the wound bed. There is a medium (34-66%) amount of necrotic tissue within the wound bed including Adherent Slough. 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 Bathing/ Shower/ Hygiene: May shower with wound dressing protected with water repellent cover or cast protector. No tub bath. Anesthetic (Use 'Patient Medications' Section for Anesthetic Order Entry): Lidocaine applied to wound bed Edema Control - Lymphedema / Segmental Compressive Device / Other: Optional: One layer of unna paste to top of compression wrap (to act as an anchor). Elevate, Exercise Daily and Avoid  Standing for Long Periods of Time. Elevate legs to the level of the heart and pump ankles as often as possible Elevate leg(s) parallel to the floor when sitting. WOUND #1: - Lower Leg Wound Laterality: Left, Anterior Cleanser: Soap and Water 1 x Per Week/30 Days Discharge Instructions: Gently cleanse wound with antibacterial soap, rinse and pat dry prior to dressing wounds Primary Dressing: Hydrofera Blue Ready Transfer Foam, 2.5x2.5 (in/in) 1 x Per Week/30 Days Discharge Instructions: Apply Hydrofera Blue Ready to wound bed as directed Secondary Dressing: ABD Pad 5x9 (in/in) 1 x Per Week/30 Days Discharge Instructions: Cover with ABD pad Compression Wrap: Profore Lite LF 3 Multilayer Compression Bandaging System 1 x Per Week/30 Days Discharge Instructions: Apply 3 multi-layer wrap as prescribed. WOUND #2: - Ankle Wound Laterality: Left, Lateral Cleanser: Soap and Water 1 x Per Week/30 Days Discharge Instructions: Gently cleanse wound with antibacterial soap, rinse and pat dry prior to dressing wounds Primary Dressing: Hydrofera Blue Ready Transfer Foam, 2.5x2.5 (in/in) 1 x Per Week/30 Days Discharge Instructions: Apply Hydrofera Blue Ready to wound bed as directed Secondary Dressing: ABD Pad 5x9 (in/in) 1 x Per Week/30 Days Discharge Instructions: Cover with ABD pad Compression Wrap: Profore Lite LF 3 Multilayer Compression Bandaging System 1 x Per Week/30 Days Discharge Instructions: Apply 3 multi-layer wrap as prescribed. 1. Would recommend currently that we going to continue with the wound care measures as before and the patient is in agreement the plan. This includes the use of the [facility] followed by an ABD pad. 2. I am also can recommend that we continue with a 3 layer compression wrap which I think is doing a good job as well. 3. I would also suggest that the patient continue to elevate her legs much as possible to help with edema control. We will see patient back for  reevaluation in 1 week here in the clinic. If anything worsens or changes patient will contact our office for additional recommendations. Electronic Signature(s) Signed: 11/19/2021 9:54:55 AM By: Worthy Keeler PA-C Entered By: Worthy Keeler on 11/19/2021 09:54:54 [name] [name] ([id number]) -------------------------------------------------------------------------------- SuperBill Details Patient Name: [name] [name] Date of Service: 11/19/2021 Medical Record Number: [id number] Patient Account Number: [id number] Date of Birth/Sex: 06-09-1935 (86  y.o. F) Treating RN: [name] Primary Care Provider: Georgian Co Other Clinician: Referring Provider: Georgian Co Treating Provider/Extender: Skipper Cliche in Treatment: 0 Diagnosis Coding ICD-10 Codes Code Description I87.2 Venous insufficiency (chronic) (peripheral) L97.822 Non-pressure chronic ulcer of other part of left lower leg with fat layer exposed L97.322 Non-pressure chronic ulcer of left ankle with fat layer exposed I48.0 Paroxysmal atrial fibrillation I10 Essential (primary) hypertension I50.42 Chronic combined systolic (congestive) and diastolic (congestive) heart failure Facility Procedures CPT4 Code: 19622297 Description: [place] VISIT-LEV 3 EST PT Modifier: Quantity: 1 CPT4 Code: [id number] Description: (Facility Use Only) [id number] - [place] LWR LT LEG Modifier: Quantity: 1 Physician Procedures CPT4 Code: 1740814 Description: [id number] - WC PHYS LEVEL 3 - EST PT Modifier: Quantity: 1 CPT4 Code: Description: ICD-10 Diagnosis Description I87.2 Venous insufficiency (chronic) (peripheral) L97.822 Non-pressure chronic ulcer of other part of left lower leg with fat lay L97.322 Non-pressure chronic ulcer of left ankle with fat layer exposed I48.0  Paroxysmal atrial fibrillation Modifier: er exposed Quantity: Electronic Signature(s) Signed: 11/19/2021 9:57:56 AM By: Worthy Keeler  PA-C Previous Signature: 11/19/2021 9:27:28 AM Version By: [name] Entered By: Worthy Keeler on 11/19/2021 09:57:55

## 2021-11-19 NOTE — Telephone Encounter (Signed)
Please schedule overdue F/U appointment. Thank you! ?

## 2021-11-21 DIAGNOSIS — E782 Mixed hyperlipidemia: Secondary | ICD-10-CM | POA: Diagnosis not present

## 2021-11-21 DIAGNOSIS — I10 Essential (primary) hypertension: Secondary | ICD-10-CM | POA: Diagnosis not present

## 2021-11-21 DIAGNOSIS — L299 Pruritus, unspecified: Secondary | ICD-10-CM | POA: Diagnosis not present

## 2021-11-21 DIAGNOSIS — J449 Chronic obstructive pulmonary disease, unspecified: Secondary | ICD-10-CM | POA: Diagnosis not present

## 2021-11-21 DIAGNOSIS — R1937 Generalized abdominal rigidity: Secondary | ICD-10-CM | POA: Diagnosis not present

## 2021-11-21 DIAGNOSIS — R1030 Lower abdominal pain, unspecified: Secondary | ICD-10-CM | POA: Diagnosis not present

## 2021-11-26 ENCOUNTER — Other Ambulatory Visit: Payer: Self-pay

## 2021-11-26 ENCOUNTER — Encounter: Payer: Medicare Other | Admitting: Internal Medicine

## 2021-11-26 DIAGNOSIS — I110 Hypertensive heart disease with heart failure: Secondary | ICD-10-CM | POA: Diagnosis not present

## 2021-11-26 DIAGNOSIS — Z7901 Long term (current) use of anticoagulants: Secondary | ICD-10-CM | POA: Diagnosis not present

## 2021-11-26 DIAGNOSIS — I872 Venous insufficiency (chronic) (peripheral): Secondary | ICD-10-CM | POA: Diagnosis not present

## 2021-11-26 DIAGNOSIS — Z87891 Personal history of nicotine dependence: Secondary | ICD-10-CM | POA: Diagnosis not present

## 2021-11-26 DIAGNOSIS — I5042 Chronic combined systolic (congestive) and diastolic (congestive) heart failure: Secondary | ICD-10-CM | POA: Diagnosis not present

## 2021-11-26 DIAGNOSIS — L97322 Non-pressure chronic ulcer of left ankle with fat layer exposed: Secondary | ICD-10-CM | POA: Diagnosis not present

## 2021-11-26 DIAGNOSIS — I480 Paroxysmal atrial fibrillation: Secondary | ICD-10-CM | POA: Diagnosis not present

## 2021-11-26 DIAGNOSIS — I252 Old myocardial infarction: Secondary | ICD-10-CM | POA: Diagnosis not present

## 2021-11-26 DIAGNOSIS — L97822 Non-pressure chronic ulcer of other part of left lower leg with fat layer exposed: Secondary | ICD-10-CM | POA: Diagnosis not present

## 2021-11-27 NOTE — Progress Notes (Signed)
KRYSTA, BLOOMFIELD ([id number]) Visit Report for 11/26/2021 Debridement Details Patient Name: [name] [name], [name] [name] Date of Service: 11/26/2021 1:15 PM Medical Record Number: [id number] Patient Account Number: [id number] Date of Birth/Sex: [birth year] (87 y.o. F) Treating RN: Carlene Coria Primary Care Provider: Georgian Co Other Clinician: Referring Provider: Georgian Co Treating Provider/Extender: Tito Dine in Treatment: 1 Debridement Performed for Wound #1 Left,Anterior Lower Leg Assessment: Performed By: Physician Ricard Dillon, MD Debridement Type: Debridement Severity of Tissue Pre Debridement: Fat layer exposed Level of Consciousness (Pre- Awake and Alert procedure): Pre-procedure Verification/Time Out Yes - 13:40 Taken: Start Time: 13:40 Total Area Debrided (L x W): 0.6 (cm) x 1.1 (cm) = 0.66 (cm) Tissue and other material Viable, Non-Viable, Slough, Subcutaneous, Slough debrided: Level: Skin/Subcutaneous Tissue Debridement Description: Excisional Instrument: Curette Bleeding: Minimum Hemostasis Achieved: Pressure End Time: 13:43 Procedural Pain: 0 Post Procedural Pain: 0 Response to Treatment: Procedure was tolerated well Level of Consciousness (Post- Awake and Alert procedure): Post Debridement Measurements of Total Wound Length: (cm) 0.6 Width: (cm) 1.1 Depth: (cm) 0.2 Volume: (cm) 0.104 Character of Wound/Ulcer Post Debridement: Improved Severity of Tissue Post Debridement: Fat layer exposed Post Procedure Diagnosis Same as Pre-procedure Electronic Signature(s) Signed: 11/27/2021 7:38:21 AM By: Linton Ham MD Signed: 11/27/2021 7:52:30 AM By: Carlene Coria RN Entered By: Carlene Coria on 11/26/2021 13:42:34 [name] [name] ([id number]) -------------------------------------------------------------------------------- Debridement Details Patient Name: [name] [name] Date of Service: 11/26/2021 1:15 PM Medical Record Number: [id number] Patient  Account Number: [id number] Date of Birth/Sex: [birth year] (86 y.o. F) Treating RN: Carlene Coria Primary Care Provider: Georgian Co Other Clinician: Referring Provider: Georgian Co Treating Provider/Extender: Tito Dine in Treatment: 1 Debridement Performed for Wound #2 Left,Lateral Ankle Assessment: Performed By: Physician Ricard Dillon, MD Debridement Type: Debridement Severity of Tissue Pre Debridement: Fat layer exposed Level of Consciousness (Pre- Awake and Alert procedure): Pre-procedure Verification/Time Out Yes - 13:40 Taken: Start Time: 13:40 Total Area Debrided (L x W): 1 (cm) x 0.4 (cm) = 0.4 (cm) Tissue and other material Viable, Non-Viable, Slough, Subcutaneous, Slough debrided: Level: Skin/Subcutaneous Tissue Debridement Description: Excisional Instrument: Curette Bleeding: Minimum Hemostasis Achieved: Pressure End Time: 13:43 Procedural Pain: 0 Post Procedural Pain: 0 Response to Treatment: Procedure was tolerated well Level of Consciousness (Post- Awake and Alert procedure): Post Debridement Measurements of Total Wound Length: (cm) 1 Width: (cm) 0.4 Depth: (cm) 0.2 Volume: (cm) 0.063 Character of Wound/Ulcer Post Debridement: Improved Severity of Tissue Post Debridement: Fat layer exposed Post Procedure Diagnosis Same as Pre-procedure Electronic Signature(s) Signed: 11/27/2021 7:38:21 AM By: Linton Ham MD Signed: 11/27/2021 7:52:30 AM By: Carlene Coria RN Entered By: Carlene Coria on 11/26/2021 13:43:27 [name] [name] ([id number]) -------------------------------------------------------------------------------- HPI Details Patient Name: [name] [name] Date of Service: 11/26/2021 1:15 PM Medical Record Number: [id number] Patient Account Number: [id number] Date of Birth/Sex: 1935-05-27 (86 y.o. F) Treating RN: Carlene Coria Primary Care Provider: Georgian Co Other Clinician: Referring Provider: Georgian Co Treating  Provider/Extender: Tito Dine in Treatment: 1 History of Present Illness HPI Description: 11/13/2021 upon evaluation today patient presents for initial inspection here in our clinic concerning issues that she has been having with a wound over her left leg at both the leg and ankle location currently. This began somewhere around 2-3 maybe even 4 weeks ago based on what she is telling us today. Nonetheless I do believe that she has a history of chronic venous insufficiency, she also has atrial fibrillation, hypertension, and congestive heart failure. Her leg has been swelling quite a bit here.  We noted ABIs to be noncompressible but she does seem to have a pretty good pulse noted on Doppler as well as by palpation. She tells me that the wound on her shin occurred as a result of accidentally striking her self with a bag of groceries it was not even that hard. The one on the ankle which she attributes to shoes that her sister got her that she feels like "somebody wore before she got on and cause this issue". 11/19/2021 upon evaluation today patient appears to be doing well with regard to her wound. I am actually very pleased with where things stand today. I do not see any signs of active infection at this time which is great news and overall I think that she is making good progress here. Her blood pressure is still up again today however. She tells me she was very nervous driving and she is not used to this area and was afraid of there being fog nonetheless I still think this is something that her primary keeps a close track on they see her every 4 weeks especially with her previous heart attack for which she is still on blood thinners as well. 1/16; the patient has 2 wounds on the left leg 1 in the lower mid tibia and one on the lateral lower leg just above the lateral malleolus.. We have been using Hydrofera Blue. Both areas are measuring smaller. The patient has significant venous  hypertension perhaps solar skin damageas well Electronic Signature(s) Signed: 11/27/2021 7:38:21 AM By: Linton Ham MD Entered By: Linton Ham on 11/26/2021 14:13:58 [name] [name] ([id number]) -------------------------------------------------------------------------------- Physical Exam Details Patient Name: [name] [name] Date of Service: 11/26/2021 1:15 PM Medical Record Number: [id number] Patient Account Number: [id number] Date of Birth/Sex: 1935/04/03 (86 y.o. F) Treating RN: Carlene Coria Primary Care Provider: Georgian Co Other Clinician: Referring Provider: Georgian Co Treating Provider/Extender: Tito Dine in Treatment: 1 Constitutional Patient is hypertensive.. Pulse regular and within target range for patient.Marland Kitchen Respirations regular, non-labored and within target range.. Temperature is normal and within the target range for the patient.Marland Kitchen appears in no distress. Cardiovascular Pedal pulses are palpable. Pulses palpable minimal edema. Hemosiderin deposition dilated venules in the feet and ankles. Notes Wound exam; the patient has a wound on the left lower mid tibia and 2 areas in the left lower leg just above the lateral malleolus. Both of these appear to be stable and improving. Electronic Signature(s) Signed: 11/27/2021 7:38:21 AM By: Linton Ham MD Entered By: Linton Ham on 11/26/2021 14:15:50 [name] [name] ([id number]) -------------------------------------------------------------------------------- Physician Orders Details Patient Name: [name] [name] Date of Service: 11/26/2021 1:15 PM Medical Record Number: [id number] Patient Account Number: [id number] Date of Birth/Sex: 1934-12-19 (86 y.o. F) Treating RN: Carlene Coria Primary Care Provider: Georgian Co Other Clinician: Referring Provider: Georgian Co Treating Provider/Extender: Tito Dine in Treatment: 1 Verbal / Phone Orders: No Diagnosis Coding Follow-up  Appointments o Return Appointment in 1 week. Bathing/ Shower/ Hygiene o May shower with wound dressing protected with water repellent cover or cast protector. o No tub bath. Anesthetic (Use 'Patient Medications' Section for Anesthetic Order Entry) o Lidocaine applied to wound bed Edema Control - Lymphedema / Segmental Compressive Device / Other o Optional: One layer of unna paste to top of compression wrap (to act as an anchor). o Elevate, Exercise Daily and Avoid Standing for Long Periods of Time. o Elevate legs to the level of the heart and pump ankles as often as possible o Elevate leg(s) parallel to  the floor when sitting. Wound Treatment Wound #1 - Lower Leg Wound Laterality: Left, Anterior Cleanser: Soap and Water 1 x Per Week/30 Days Discharge Instructions: Gently cleanse wound with antibacterial soap, rinse and pat dry prior to dressing wounds Primary Dressing: Hydrofera Blue Ready Transfer Foam, 2.5x2.5 (in/in) 1 x Per Week/30 Days Discharge Instructions: Apply Hydrofera Blue Ready to wound bed as directed Secondary Dressing: Gauze 1 x Per Week/30 Days Discharge Instructions: As directed: dry, moistened with saline or moistened with Dakins Solution Compression Wrap: Profore Lite LF 3 Multilayer Compression Bandaging System 1 x Per Week/30 Days Discharge Instructions: Apply 3 multi-layer wrap as prescribed. Wound #2 - Ankle Wound Laterality: Left, Lateral Cleanser: Soap and Water 1 x Per Week/30 Days Discharge Instructions: Gently cleanse wound with antibacterial soap, rinse and pat dry prior to dressing wounds Primary Dressing: Hydrofera Blue Ready Transfer Foam, 2.5x2.5 (in/in) 1 x Per Week/30 Days Discharge Instructions: Apply Hydrofera Blue Ready to wound bed as directed Secondary Dressing: Gauze 1 x Per Week/30 Days Discharge Instructions: As directed: dry, moistened with saline or moistened with Dakins Solution Compression Wrap: Profore Lite LF 3 Multilayer  Compression Bandaging System 1 x Per Week/30 Days Discharge Instructions: Apply 3 multi-layer wrap as prescribed. Electronic Signature(s) Signed: 11/27/2021 7:38:21 AM By: Linton Ham MD Signed: 11/27/2021 7:52:30 AM By: Carlene Coria RN Entered By: Carlene Coria on 11/26/2021 13:59:00 [name] [name] ([id number]) -------------------------------------------------------------------------------- Problem List Details Patient Name: [name] [name] Date of Service: 11/26/2021 1:15 PM Medical Record Number: [id number] Patient Account Number: [id number] Date of Birth/Sex: 1935-08-01 (86 y.o. F) Treating RN: Carlene Coria Primary Care Provider: Georgian Co Other Clinician: Referring Provider: Georgian Co Treating Provider/Extender: Tito Dine in Treatment: 1 Active Problems ICD-10 Encounter Code Description Active Date MDM Diagnosis I87.2 Venous insufficiency (chronic) (peripheral) 11/13/2021 No Yes L97.822 Non-pressure chronic ulcer of other part of left lower leg with fat layer 11/13/2021 No Yes exposed L97.322 Non-pressure chronic ulcer of left ankle with fat layer exposed 11/13/2021 No Yes I48.0 Paroxysmal atrial fibrillation 11/13/2021 No Yes I10 Essential (primary) hypertension 11/13/2021 No Yes I50.42 Chronic combined systolic (congestive) and diastolic (congestive) heart 11/13/2021 No Yes failure Inactive Problems Resolved Problems Electronic Signature(s) Signed: 11/27/2021 7:38:21 AM By: Linton Ham MD Entered By: Linton Ham on 11/26/2021 14:12:45 [name] [name] ([id number]) -------------------------------------------------------------------------------- Progress Note Details Patient Name: [name] [name] Date of Service: 11/26/2021 1:15 PM Medical Record Number: [id number] Patient Account Number: [id number] Date of Birth/Sex: [birth year] (86 y.o. F) Treating RN: Carlene Coria Primary Care Provider: Georgian Co Other Clinician: Referring Provider: Georgian Co Treating Provider/Extender: Tito Dine in Treatment: 1 Subjective History of Present Illness (HPI) 11/13/2021 upon evaluation today patient presents for initial inspection here in our clinic concerning issues that she has been having with a wound over her left leg at both the leg and ankle location currently. This began somewhere around 2-3 maybe even 4 weeks ago based on what she is telling us today. Nonetheless I do believe that she has a history of chronic venous insufficiency, she also has atrial fibrillation, hypertension, and congestive heart failure. Her leg has been swelling quite a bit here. We noted ABIs to be noncompressible but she does seem to have a pretty good pulse noted on Doppler as well as by palpation. She tells me that the wound on her shin occurred as a result of accidentally striking her self with a bag of groceries it was not even that hard. The one on the ankle which she attributes  to shoes that her sister got her that she feels like "somebody wore before she got on and cause this issue". 11/19/2021 upon evaluation today patient appears to be doing well with regard to her wound. I am actually very pleased with where things stand today. I do not see any signs of active infection at this time which is great news and overall I think that she is making good progress here. Her blood pressure is still up again today however. She tells me she was very nervous driving and she is not used to this area and was afraid of there being fog nonetheless I still think this is something that her primary keeps a close track on they see her every 4 weeks especially with her previous heart attack for which she is still on blood thinners as well. 1/16; the patient has 2 wounds on the left leg 1 in the lower mid tibia and one on the lateral lower leg just above the lateral malleolus.. We have been using Hydrofera Blue. Both areas are measuring smaller. The patient has significant  venous hypertension perhaps solar skin damageas well Objective Constitutional Patient is hypertensive.. Pulse regular and within target range for patient.Marland Kitchen Respirations regular, non-labored and within target range.. Temperature is normal and within the target range for the patient.Marland Kitchen appears in no distress. Vitals Time Taken: 3:17 PM, Height: 67 in, Weight: 112 lbs, BMI: 17.5, Temperature: 98.2 F, Pulse: 62 bpm, Respiratory Rate: 18 breaths/min, Blood Pressure: 191/74 mmHg. Cardiovascular Pedal pulses are palpable. Pulses palpable minimal edema. Hemosiderin deposition dilated venules in the feet and ankles. General Notes: Wound exam; the patient has a wound on the left lower mid tibia and 2 areas in the left lower leg just above the lateral malleolus. Both of these appear to be stable and improving. Integumentary (Hair, Skin) Wound #1 status is Open. Original cause of wound was Trauma. The date acquired was: 10/11/2021. The wound has been in treatment 1 weeks. The wound is located on the Left,Anterior Lower Leg. The wound measures 0.6cm length x 1.1cm width x 0.2cm depth; 0.518cm^2 area and 0.104cm^3 volume. There is Fat Layer (Subcutaneous Tissue) exposed. There is no tunneling or undermining noted. There is a medium amount of serosanguineous drainage noted. There is large (67-100%) red, hyper - granulation within the wound bed. There is a small (1-33%) amount of necrotic tissue within the wound bed including Adherent Slough. Wound #2 status is Open. Original cause of wound was Trauma. The date acquired was: 10/24/2021. The wound has been in treatment 1 weeks. The wound is located on the Left,Lateral Ankle. The wound measures 1cm length x 0.4cm width x 0.2cm depth; 0.314cm^2 area and 0.063cm^3 volume. There is no tunneling or undermining noted. There is a medium amount of serosanguineous drainage noted. There is medium (34-66%) pink granulation within the wound bed. There is a medium (34-66%)  amount of necrotic tissue within the wound bed including Adherent Slough. Assessment Active Problems ICD-10 Venous insufficiency (chronic) (peripheral) [name] [name], [name] [name] ([id number]) Non-pressure chronic ulcer of other part of left lower leg with fat layer exposed Non-pressure chronic ulcer of left ankle with fat layer exposed Paroxysmal atrial fibrillation Essential (primary) hypertension Chronic combined systolic (congestive) and diastolic (congestive) heart failure Procedures Wound #1 Pre-procedure diagnosis of Wound #1 is a Venous Leg Ulcer located on the Left,Anterior Lower Leg .Severity of Tissue Pre Debridement is: Fat layer exposed. There was a Excisional Skin/Subcutaneous Tissue Debridement with a total area of 0.66 sq cm performed by Kenyon Eshleman,  Euna Armon G, MD. With the following instrument(s): Curette to remove Viable and Non-Viable tissue/material. Material removed includes Subcutaneous Tissue and Slough and. No specimens were taken. A time out was conducted at 13:40, prior to the start of the procedure. A Minimum amount of bleeding was controlled with Pressure. The procedure was tolerated well with a pain level of 0 throughout and a pain level of 0 following the procedure. Post Debridement Measurements: 0.6cm length x 1.1cm width x 0.2cm depth; 0.104cm^3 volume. Character of Wound/Ulcer Post Debridement is improved. Severity of Tissue Post Debridement is: Fat layer exposed. Post procedure Diagnosis Wound #1: Same as Pre-Procedure Wound #2 Pre-procedure diagnosis of Wound #2 is a Venous Leg Ulcer located on the Left,Lateral Ankle .Severity of Tissue Pre Debridement is: Fat layer exposed. There was a Excisional Skin/Subcutaneous Tissue Debridement with a total area of 0.4 sq cm performed by Ricard Dillon, MD. With the following instrument(s): Curette to remove Viable and Non-Viable tissue/material. Material removed includes Subcutaneous Tissue and Slough and. No specimens were taken. A  time out was conducted at 13:40, prior to the start of the procedure. A Minimum amount of bleeding was controlled with Pressure. The procedure was tolerated well with a pain level of 0 throughout and a pain level of 0 following the procedure. Post Debridement Measurements: 1cm length x 0.4cm width x 0.2cm depth; 0.063cm^3 volume. Character of Wound/Ulcer Post Debridement is improved. Severity of Tissue Post Debridement is: Fat layer exposed. Post procedure Diagnosis Wound #2: Same as Pre-Procedure Plan Follow-up Appointments: Return Appointment in 1 week. Bathing/ Shower/ Hygiene: May shower with wound dressing protected with water repellent cover or cast protector. No tub bath. Anesthetic (Use 'Patient Medications' Section for Anesthetic Order Entry): Lidocaine applied to wound bed Edema Control - Lymphedema / Segmental Compressive Device / Other: Optional: One layer of unna paste to top of compression wrap (to act as an anchor). Elevate, Exercise Daily and Avoid Standing for Long Periods of Time. Elevate legs to the level of the heart and pump ankles as often as possible Elevate leg(s) parallel to the floor when sitting. WOUND #1: - Lower Leg Wound Laterality: Left, Anterior Cleanser: Soap and Water 1 x Per Week/30 Days Discharge Instructions: Gently cleanse wound with antibacterial soap, rinse and pat dry prior to dressing wounds Primary Dressing: Hydrofera Blue Ready Transfer Foam, 2.5x2.5 (in/in) 1 x Per Week/30 Days Discharge Instructions: Apply Hydrofera Blue Ready to wound bed as directed Secondary Dressing: Gauze 1 x Per Week/30 Days Discharge Instructions: As directed: dry, moistened with saline or moistened with Dakins Solution Compression Wrap: Profore Lite LF 3 Multilayer Compression Bandaging System 1 x Per Week/30 Days Discharge Instructions: Apply 3 multi-layer wrap as prescribed. WOUND #2: - Ankle Wound Laterality: Left, Lateral Cleanser: Soap and Water 1 x Per Week/30  Days Discharge Instructions: Gently cleanse wound with antibacterial soap, rinse and pat dry prior to dressing wounds Primary Dressing: Hydrofera Blue Ready Transfer Foam, 2.5x2.5 (in/in) 1 x Per Week/30 Days Discharge Instructions: Apply Hydrofera Blue Ready to wound bed as directed Secondary Dressing: Gauze 1 x Per Week/30 Days Discharge Instructions: As directed: dry, moistened with saline or moistened with Dakins Solution Compression Wrap: Profore Lite LF 3 Multilayer Compression Bandaging System 1 x Per Week/30 Days Discharge Instructions: Apply 3 multi-layer wrap as prescribed. Erno, Aireanna ([id number]) 1. Continue with Hydrofera Blue/foam/gauze with 3 layer compression on the left 2. I talked to the patient about at least support stockings otherwise she is at extreme risk for further  breakdown in her legs bilaterally Electronic Signature(s) Signed: 11/27/2021 7:38:21 AM By: Linton Ham MD Entered By: Linton Ham on 11/26/2021 14:16:50 [name] [name] ([id number]) -------------------------------------------------------------------------------- SuperBill Details Patient Name: [name] [name] Date of Service: 11/26/2021 Medical Record Number: [id number] Patient Account Number: [id number] Date of Birth/Sex: 1934-12-19 (86 y.o. F) Treating RN: Carlene Coria Primary Care Provider: Georgian Co Other Clinician: Referring Provider: Georgian Co Treating Provider/Extender: Tito Dine in Treatment: 1 Diagnosis Coding ICD-10 Codes Code Description I87.2 Venous insufficiency (chronic) (peripheral) L97.822 Non-pressure chronic ulcer of other part of left lower leg with fat layer exposed L97.322 Non-pressure chronic ulcer of left ankle with fat layer exposed I48.0 Paroxysmal atrial fibrillation I10 Essential (primary) hypertension I50.42 Chronic combined systolic (congestive) and diastolic (congestive) heart failure Facility Procedures CPT4 Code:  [id number] Description: 34356 - DEB SUBQ TISSUE 20 SQ CM/< Modifier: Quantity: 1 CPT4 Code: Description: ICD-10 Diagnosis Description L97.822 Non-pressure chronic ulcer of other part of left lower leg with fat layer L97.322 Non-pressure chronic ulcer of left ankle with fat layer exposed Modifier: exposed Quantity: Physician Procedures CPT4 Code: [id number] Description: 11042 - WC PHYS SUBQ TISS 20 SQ CM Modifier: Quantity: 1 CPT4 Code: Description: ICD-10 Diagnosis Description L97.822 Non-pressure chronic ulcer of other part of left lower leg with fat layer L97.322 Non-pressure chronic ulcer of left ankle with fat layer exposed Modifier: exposed Quantity: Electronic Signature(s) Signed: 11/27/2021 7:38:21 AM By: Linton Ham MD Entered By: Linton Ham on 11/26/2021 14:17:16

## 2021-11-27 NOTE — Progress Notes (Signed)
ERABELLA, KUIPERS ([id number]) Visit Report for 11/26/2021 Arrival Information Details Patient Name: [name] [name], [name] [name] Date of Service: 11/26/2021 1:15 PM Medical Record Number: [id number] Patient Account Number: [id number] Date of Birth/Sex: April 20, 1935 (87 y.o. F) Treating RN: Carlene Coria Primary Care Shandy Checo: [place] [employer] Other Clinician: Referring Deen Deguia: [place] [employer] Treating Sharelle Burditt/Extender: Tito Dine in Treatment: 1 Visit Information History Since Last Visit All ordered tests and consults were completed: No Patient Arrived: Gilford Rile Added or deleted any medications: No Arrival Time: 13:12 Any new allergies or adverse reactions: No Accompanied By: self Had a fall or experienced change in No Transfer Assistance: None activities of daily living that may affect Patient Identification Verified: Yes risk of falls: Secondary Verification Process Completed: Yes Signs or symptoms of abuse/neglect since last visito No Patient Requires Transmission-Based No Hospitalized since last visit: No Precautions: Implantable device outside of the clinic excluding No Patient Has Alerts: Yes cellular tissue based products placed in the center Patient Alerts: Patient on Blood since last visit: Thinner Has Dressing in Place as Prescribed: Yes NON compressable Has Compression in Place as Prescribed: Yes Pain Present Now: No Electronic Signature(s) Signed: 11/27/2021 7:52:30 AM By: Carlene Coria RN Entered By: Carlene Coria on 11/26/2021 13:17:11 [name] [name] ([id number]) -------------------------------------------------------------------------------- Clinic Level of Care Assessment Details Patient Name: [name], [name] Date of Service: 11/26/2021 1:15 PM Medical Record Number: [id number] Patient Account Number: [id number] Date of Birth/Sex: February 21, 1935 (86 y.o. F) Treating RN: Carlene Coria Primary Care Rana Adorno: [place] Other Clinician: Referring Yamilett Anastos: [place] Treating Ulysees Robarts/Extender: Tito Dine in Treatment: 1 Clinic Level of Care Assessment Items TOOL 1 Quantity Score []  - Use when EandM and Procedure is performed on INITIAL visit 0 ASSESSMENTS - Nursing Assessment / Reassessment []  - General Physical Exam (combine w/ comprehensive assessment (listed just below) when performed on new 0 pt. evals) []  - 0 Comprehensive Assessment (HX, ROS, Risk Assessments, Wounds Hx, etc.) ASSESSMENTS - Wound and Skin Assessment / Reassessment []  - Dermatologic / Skin Assessment (not related to wound area) 0 ASSESSMENTS - Ostomy and/or Continence Assessment and Care []  - Incontinence Assessment and Management 0 []  - 0 Ostomy Care Assessment and Management (repouching, etc.) PROCESS - Coordination of Care []  - Simple Patient / Family Education for ongoing care 0 []  - 0 Complex (extensive) Patient / Family Education for ongoing care []  - 0 Staff obtains Programmer, systems, Records, Test Results / Process Orders []  - 0 Staff telephones HHA, Nursing Homes / Clarify orders / etc []  - 0 Routine Transfer to another Facility (non-emergent condition) []  - 0 Routine Hospital Admission (non-emergent condition) []  - 0 New Admissions / Biomedical engineer / Ordering NPWT, Apligraf, etc. []  - 0 Emergency Hospital Admission (emergent condition) PROCESS - Special Needs []  - Pediatric / Minor Patient Management 0 []  - 0 Isolation Patient Management []  - 0 Hearing / Language / Visual special needs []  - 0 Assessment of Community assistance (transportation, D/C planning, etc.) []  - 0 Additional assistance / Altered mentation []  - 0 Support Surface(s) Assessment (bed, cushion, seat, etc.) INTERVENTIONS - Miscellaneous []  - External ear exam 0 []  - 0 Patient Transfer (multiple staff / Civil Service fast streamer / Similar devices) []  - 0 Simple Staple / Suture removal (25 or less) []  - 0 Complex Staple / Suture removal (26 or more) []  -  0 Hypo/Hyperglycemic Management (do not check if billed separately) []  - 0 Ankle / Brachial Index (ABI) - do not check if billed separately Has the patient been seen  at the hospital within the last three years: Yes Total Score: 0 Level Of Care: ____ [name] [name] ([id number]) Electronic Signature(s) Signed: 11/27/2021 7:52:30 AM By: Carlene Coria RN Entered By: Carlene Coria on 11/26/2021 13:45:29 [name] [name] ([id number]) -------------------------------------------------------------------------------- Encounter Discharge Information Details Patient Name: [name] [name] Date of Service: 11/26/2021 1:15 PM Medical Record Number: [id number] Patient Account Number: [id number] Date of Birth/Sex: [birth year] (86 y.o. F) Treating RN: Carlene Coria Primary Care Bartolo Montanye: [place] Other Clinician: Referring Makinna Andy: [place] Treating Kezia Benevides/Extender: Tito Dine in Treatment: 1 Encounter Discharge Information Items Post Procedure Vitals Discharge Condition: Stable Temperature (F): 98.2 Ambulatory Status: Walker Pulse (bpm): 62 Discharge Destination: Home Respiratory Rate (breaths/min): 18 Transportation: Private Auto Blood Pressure (mmHg): 191/74 Accompanied By: self Schedule Follow-up Appointment: Yes Clinical Summary of Care: Patient Declined Electronic Signature(s) Signed: 11/27/2021 7:52:30 AM By: Carlene Coria RN Entered By: Carlene Coria on 11/26/2021 13:55:08 [name] [name] ([id number]) -------------------------------------------------------------------------------- Lower Extremity Assessment Details Patient Name: [name] [name] Date of Service: 11/26/2021 1:15 PM Medical Record Number: [id number] Patient Account Number: [id number] Date of Birth/Sex: 05-24-1935 (86 y.o. F) Treating RN: Carlene Coria Primary Care Emiko Osorto: [place] Other Clinician: Referring Jasiya Markie: [employer] Treating Nayelly Laughman/Extender: Tito Dine in Treatment:  1 Edema Assessment Assessed: [Left: No] [Right: No] Edema: [Left: N] [Right: o] Calf Left: Right: Point of Measurement: 32 cm From Medial Instep 30 cm Ankle Left: Right: Point of Measurement: 10 cm From Medial Instep 18 cm Vascular Assessment Pulses: Dorsalis Pedis Palpable: [Left:Yes] Electronic Signature(s) Signed: 11/27/2021 7:52:30 AM By: Carlene Coria RN Entered By: Carlene Coria on 11/26/2021 13:29:22 [name] [name] ([id number]) -------------------------------------------------------------------------------- Multi Wound Chart Details Patient Name: [name] [name] Date of Service: 11/26/2021 1:15 PM Medical Record Number: [id number] Patient Account Number: [id number] Date of Birth/Sex: [birth year] (86 y.o. F) Treating RN: Carlene Coria Primary Care Tejasvi Brissett: [employer] Other Clinician: Referring Donn Wilmot: [employer] Treating Hicks Feick/Extender: Tito Dine in Treatment: 1 Vital Signs Height(in): 67 Pulse(bpm): 42 Weight(lbs): 112 Blood Pressure(mmHg): 191/74 Body Mass Index(BMI): 18 Temperature(F): 98.2 Respiratory Rate(breaths/min): 18 Photos: [N/A:N/A] Wound Location: Left, Anterior Lower Leg Left, Lateral Ankle N/A Wounding Event: Trauma Trauma N/A Primary Etiology: Venous Leg Ulcer Venous Leg Ulcer N/A Comorbid History: Congestive Heart Failure, Congestive Heart Failure, N/A Hypertension Hypertension Date Acquired: 10/11/2021 10/24/2021 N/A Weeks of Treatment: 1 1 N/A Wound Status: Open Open N/A Measurements L x W x D (cm) 0.6x1.1x0.2 1x0.4x0.2 N/A Area (cm) : 0.518 0.314 N/A Volume (cm) : 0.104 0.063 N/A % Reduction in Area: 34.00% 83.30% N/A % Reduction in Volume: -31.60% 83.30% N/A Classification: Full Thickness Without Exposed Full Thickness Without Exposed N/A Support Structures Support Structures Exudate Amount: Medium Medium N/A Exudate Type: Serosanguineous Serosanguineous N/A Exudate Color: red, brown red, brown N/A Granulation  Amount: Large (67-100%) Medium (34-66%) N/A Granulation Quality: Red, Hyper-granulation Pink N/A Necrotic Amount: Small (1-33%) Medium (34-66%) N/A Exposed Structures: Fat Layer (Subcutaneous Tissue): Fascia: No N/A Yes Fat Layer (Subcutaneous Tissue): Fascia: No No Tendon: No Tendon: No Muscle: No Muscle: No Joint: No Joint: No Bone: No Bone: No Epithelialization: None None N/A Treatment Notes Electronic Signature(s) Signed: 11/27/2021 7:52:30 AM By: Carlene Coria RN Entered By: Carlene Coria on 11/26/2021 [place], [place] ([id number]) -------------------------------------------------------------------------------- [place] Details Patient Name: [name] [name] Date of Service: 11/26/2021 1:15 PM Medical Record Number: [id number] Patient Account Number: [id number] Date of Birth/Sex: 06-15-1935 (86 y.o. F) Treating RN: Carlene Coria Primary Care Jamesetta Greenhalgh: [place] Other Clinician: Referring Karem Farha: [place] [employer] Treating Jasreet Dickie/Extender:  ROBSON, MICHAEL G Weeks in Treatment: 1 Active Inactive Wound/Skin Impairment Nursing Diagnoses: Knowledge deficit related to ulceration/compromised skin integrity Goals: Patient/caregiver will verbalize understanding of skin care regimen Date Initiated: 11/13/2021 Target Resolution Date: 12/14/2021 Goal Status: Active Ulcer/skin breakdown will have a volume reduction of 30% by week 4 Date Initiated: 11/13/2021 Target Resolution Date: 01/11/2022 Goal Status: Active Ulcer/skin breakdown will have a volume reduction of 50% by week 8 Date Initiated: 11/13/2021 Target Resolution Date: 02/11/2022 Goal Status: Active Ulcer/skin breakdown will have a volume reduction of 80% by week 12 Date Initiated: 11/13/2021 Target Resolution Date: 03/13/2022 Goal Status: Active Ulcer/skin breakdown will heal within 14 weeks Date Initiated: 11/13/2021 Target Resolution Date: 04/13/2022 Goal Status: Active Interventions: Assess  patient/caregiver ability to obtain necessary supplies Assess patient/caregiver ability to perform ulcer/skin care regimen upon admission and as needed Assess ulceration(s) every visit Notes: Electronic Signature(s) Signed: 11/27/2021 7:52:30 AM By: Carlene Coria RN Entered By: Carlene Coria on 11/26/2021 [place], [place] ([id number]) -------------------------------------------------------------------------------- Pain Assessment Details Patient Name: [name] [name] Date of Service: 11/26/2021 1:15 PM Medical Record Number: [id number] Patient Account Number: [id number] Date of Birth/Sex: 07-16-1935 (87 y.o. F) Treating RN: Carlene Coria Primary Care Ricka Westra: [place] [employer] Other Clinician: Referring Tavis Kring: [place] [employer] Treating Jacalyn Biggs/Extender: Tito Dine in Treatment: 1 Active Problems Location of Pain Severity and Description of Pain Patient Has Paino No Site Locations Pain Management and Medication Current Pain Management: Electronic Signature(s) Signed: 11/27/2021 7:52:30 AM By: Carlene Coria RN Entered By: Carlene Coria on 11/26/2021 13:20:03 [name] [name] ([id number]) -------------------------------------------------------------------------------- Patient/Caregiver Education Details Patient Name: [name] [name] Date of Service: 11/26/2021 1:15 PM Medical Record Number: [id number] Patient Account Number: [id number] Date of Birth/Gender: 02-Mar-1935 (86 y.o. F) Treating RN: Carlene Coria Primary Care Physician: [place] [employer] Other Clinician: Referring Physician: [place] [employer] Treating Physician/Extender: Tito Dine in Treatment: 1 Education Assessment Education Provided To: Patient Education Topics Provided Wound/Skin Impairment: Methods: Explain/Verbal Responses: State content correctly Electronic Signature(s) Signed: 11/27/2021 7:52:30 AM By: Carlene Coria RN Entered By: Carlene Coria on 11/26/2021 [place], [place]  ([id number]) -------------------------------------------------------------------------------- Wound Assessment Details Patient Name: [name] [name] Date of Service: 11/26/2021 1:15 PM Medical Record Number: [id number] Patient Account Number: [id number] Date of Birth/Sex: [birth year] (86 y.o. F) Treating RN: Carlene Coria Primary Care Prathik Aman: [place] [employer] Other Clinician: Referring Elic Vencill: [place] [employer] Treating [employer]/Extender: Tito Dine in Treatment: 1 Wound Status Wound Number: 1 Primary Etiology: Venous Leg Ulcer Wound Location: Left, Anterior Lower Leg Wound Status: Open Wounding Event: Trauma Comorbid History: Congestive Heart Failure, Hypertension Date Acquired: 10/11/2021 Weeks Of Treatment: 1 Clustered Wound: No Photos Wound Measurements Length: (cm) 0.6 Width: (cm) 1.1 Depth: (cm) 0.2 Area: (cm) 0.518 Volume: (cm) 0.104 % Reduction in Area: 34% % Reduction in Volume: -31.6% Epithelialization: None Tunneling: No Undermining: No Wound Description Classification: Full Thickness Without Exposed Support Structures Exudate Amount: Medium Exudate Type: Serosanguineous Exudate Color: red, brown Foul Odor After Cleansing: No Slough/Fibrino Yes Wound Bed Granulation Amount: Large (67-100%) Exposed Structure Granulation Quality: Red, Hyper-granulation Fascia Exposed: No Necrotic Amount: Small (1-33%) Fat Layer (Subcutaneous Tissue) Exposed: Yes Necrotic Quality: Adherent Slough Tendon Exposed: No Muscle Exposed: No Joint Exposed: No Bone Exposed: No Treatment Notes Wound #1 (Lower Leg) Wound Laterality: Left, Anterior Cleanser Soap and Water Discharge Instruction: Gently cleanse wound with antibacterial soap, rinse and pat dry prior to dressing wounds Peri-Wound Care Geissinger, Braylynn ([id number]) Topical Primary Dressing Hydrofera Blue Ready Transfer Foam, 2.5x2.5 (in/in) Discharge Instruction: Apply Hydrofera Blue Ready to  wound bed as  directed Secondary Dressing ABD Pad 5x9 (in/in) Discharge Instruction: Cover with ABD pad Secured With Compression Wrap Profore Lite LF 3 Multilayer Compression Bandaging System Discharge Instruction: Apply 3 multi-layer wrap as prescribed. Compression Stockings Add-Ons Electronic Signature(s) Signed: 11/27/2021 7:52:30 AM By: Carlene Coria RN Entered By: Carlene Coria on 11/26/2021 13:28:22 [name] [name] ([id number]) -------------------------------------------------------------------------------- Wound Assessment Details Patient Name: [name] [name] Date of Service: 11/26/2021 1:15 PM Medical Record Number: [id number] Patient Account Number: [id number] Date of Birth/Sex: Sep 14, 1935 (86 y.o. F) Treating RN: Carlene Coria Primary Care Kaylon Laroche: [place] [employer] Other Clinician: Referring Eliceo Gladu: [place] [employer] Treating Trenae Brunke/Extender: Tito Dine in Treatment: 1 Wound Status Wound Number: 2 Primary Etiology: Venous Leg Ulcer Wound Location: Left, Lateral Ankle Wound Status: Open Wounding Event: Trauma Comorbid History: Congestive Heart Failure, Hypertension Date Acquired: 10/24/2021 Weeks Of Treatment: 1 Clustered Wound: No Photos Wound Measurements Length: (cm) 1 Width: (cm) 0.4 Depth: (cm) 0.2 Area: (cm) 0.314 Volume: (cm) 0.063 % Reduction in Area: 83.3% % Reduction in Volume: 83.3% Epithelialization: None Tunneling: No Undermining: No Wound Description Classification: Full Thickness Without Exposed Support Structures Exudate Amount: Medium Exudate Type: Serosanguineous Exudate Color: red, brown Foul Odor After Cleansing: No Slough/Fibrino Yes Wound Bed Granulation Amount: Medium (34-66%) Exposed Structure Granulation Quality: Pink Fascia Exposed: No Necrotic Amount: Medium (34-66%) Fat Layer (Subcutaneous Tissue) Exposed: No Necrotic Quality: Adherent Slough Tendon Exposed: No Muscle Exposed: No Joint Exposed: No Bone Exposed:  No Treatment Notes Wound #2 (Ankle) Wound Laterality: Left, Lateral Cleanser Soap and Water Discharge Instruction: Gently cleanse wound with antibacterial soap, rinse and pat dry prior to dressing wounds Peri-Wound Care KERIANN, RANKIN ([id number]) Topical Primary Dressing Hydrofera Blue Ready Transfer Foam, 2.5x2.5 (in/in) Discharge Instruction: Apply Hydrofera Blue Ready to wound bed as directed Secondary Dressing ABD Pad 5x9 (in/in) Discharge Instruction: Cover with ABD pad Secured With Compression Wrap Profore Lite LF 3 Multilayer Compression Bandaging System Discharge Instruction: Apply 3 multi-layer wrap as prescribed. Compression Stockings Add-Ons Electronic Signature(s) Signed: 11/27/2021 7:52:30 AM By: Carlene Coria RN Entered By: Carlene Coria on 11/26/2021 13:28:39 [name] [name] ([id number]) -------------------------------------------------------------------------------- Vitals Details Patient Name: [name] [name] Date of Service: 11/26/2021 1:15 PM Medical Record Number: [id number] Patient Account Number: [id number] Date of Birth/Sex: Jun 05, 1935 (86 y.o. F) Treating RN: Carlene Coria Primary Care Georgetta Crafton: [place] [employer] Other Clinician: Referring Terease Marcotte: [place] [employer] Treating Willmer Fellers/Extender: Tito Dine in Treatment: 1 Vital Signs Time Taken: 15:17 Temperature (F): 98.2 Height (in): 67 Pulse (bpm): 62 Weight (lbs): 112 Respiratory Rate (breaths/min): 18 Body Mass Index (BMI): 17.5 Blood Pressure (mmHg): 191/74 Reference Range: 80 - 120 mg / dl Electronic Signature(s) Signed: 11/27/2021 7:52:30 AM By: Carlene Coria RN Entered By: Carlene Coria on 11/26/2021 13:19:52

## 2021-12-03 ENCOUNTER — Encounter: Payer: Medicare Other | Admitting: Physician Assistant

## 2021-12-03 ENCOUNTER — Other Ambulatory Visit: Payer: Self-pay

## 2021-12-03 DIAGNOSIS — I5042 Chronic combined systolic (congestive) and diastolic (congestive) heart failure: Secondary | ICD-10-CM | POA: Diagnosis not present

## 2021-12-03 DIAGNOSIS — L97322 Non-pressure chronic ulcer of left ankle with fat layer exposed: Secondary | ICD-10-CM | POA: Diagnosis not present

## 2021-12-03 DIAGNOSIS — I110 Hypertensive heart disease with heart failure: Secondary | ICD-10-CM | POA: Diagnosis not present

## 2021-12-03 DIAGNOSIS — Z7901 Long term (current) use of anticoagulants: Secondary | ICD-10-CM | POA: Diagnosis not present

## 2021-12-03 DIAGNOSIS — I10 Essential (primary) hypertension: Secondary | ICD-10-CM | POA: Diagnosis not present

## 2021-12-03 DIAGNOSIS — I480 Paroxysmal atrial fibrillation: Secondary | ICD-10-CM | POA: Diagnosis not present

## 2021-12-03 DIAGNOSIS — L97822 Non-pressure chronic ulcer of other part of left lower leg with fat layer exposed: Secondary | ICD-10-CM | POA: Diagnosis not present

## 2021-12-03 DIAGNOSIS — Z87891 Personal history of nicotine dependence: Secondary | ICD-10-CM | POA: Diagnosis not present

## 2021-12-03 DIAGNOSIS — I252 Old myocardial infarction: Secondary | ICD-10-CM | POA: Diagnosis not present

## 2021-12-03 DIAGNOSIS — I872 Venous insufficiency (chronic) (peripheral): Secondary | ICD-10-CM | POA: Diagnosis not present

## 2021-12-03 NOTE — Progress Notes (Addendum)
COMFORT, IVERSEN ([id number]) Visit Report for 12/03/2021 Chief Complaint Document Details Patient Name: [name] [name], [name] [name] Date of Service: 12/03/2021 10:15 AM Medical Record Number: [id number] Patient Account Number: [id number] Date of Birth/Sex: 11-21-1934 (87 y.o. F) Treating RN: Carlene Coria Primary Care Provider: Georgian Co Other Clinician: Referring Provider: Georgian Co Treating Provider/Extender: Skipper Cliche in Treatment: 2 Information Obtained from: Patient Chief Complaint Left leg and left ankle ulcers Electronic Signature(s) Signed: 12/03/2021 9:39:30 AM By: Worthy Keeler PA-C Entered By: Worthy Keeler on 12/03/2021 09:39:29 [name] [name] ([id number]) -------------------------------------------------------------------------------- HPI Details Patient Name: [name] [name] Date of Service: 12/03/2021 10:15 AM Medical Record Number: [id number] Patient Account Number: [id number] Date of Birth/Sex: 1935-09-10 (86 y.o. F) Treating RN: Carlene Coria Primary Care Provider: Georgian Co Other Clinician: Referring Provider: Georgian Co Treating Provider/Extender: Skipper Cliche in Treatment: 2 History of Present Illness HPI Description: 11/13/2021 upon evaluation today patient presents for initial inspection here in our clinic concerning issues that she has been having with a wound over her left leg at both the leg and ankle location currently. This began somewhere around 2-3 maybe even 4 weeks ago based on what she is telling us today. Nonetheless I do believe that she has a history of chronic venous insufficiency, she also has atrial fibrillation, hypertension, and congestive heart failure. Her leg has been swelling quite a bit here. We noted ABIs to be noncompressible but she does seem to have a pretty good pulse noted on Doppler as well as by palpation. She tells me that the wound on her shin occurred as a result of accidentally striking her self with a bag of  groceries it was not even that hard. The one on the ankle which she attributes to shoes that her sister got her that she feels like "somebody wore before she got on and cause this issue". 11/19/2021 upon evaluation today patient appears to be doing well with regard to her wound. I am actually very pleased with where things stand today. I do not see any signs of active infection at this time which is great news and overall I think that she is making good progress here. Her blood pressure is still up again today however. She tells me she was very nervous driving and she is not used to this area and was afraid of there being fog nonetheless I still think this is something that her primary keeps a close track on they see her every 4 weeks especially with her previous heart attack for which she is still on blood thinners as well. 1/16; the patient has 2 wounds on the left leg 1 in the lower mid tibia and one on the lateral lower leg just above the lateral malleolus.. We have been using Hydrofera Blue. Both areas are measuring smaller. The patient has significant venous hypertension perhaps solar skin damageas well 12/03/2021 upon evaluation today patient's wound is actually showing signs of good improvement which is great news. Fortunately I do not see any evidence of active infection at this time which is great news as well. No fevers, chills, nausea, vomiting, or diarrhea. Electronic Signature(s) Signed: 12/03/2021 11:02:11 AM By: Worthy Keeler PA-C Entered By: Worthy Keeler on 12/03/2021 11:02:11 [name] [name] ([id number]) -------------------------------------------------------------------------------- Physical Exam Details Patient Name: [name] [name] Date of Service: 12/03/2021 10:15 AM Medical Record Number: [id number] Patient Account Number: [id number] Date of Birth/Sex: 1935-10-03 (86 y.o. F) Treating RN: Carlene Coria Primary Care Provider: Georgian Co Other Clinician: Referring Provider:  Georgian Co Treating  Provider/Extender: Jeri Cos Weeks in Treatment: 2 Constitutional Well-nourished and well-hydrated in no acute distress. Respiratory normal breathing without difficulty. Psychiatric this patient is able to make decisions and demonstrates good insight into disease process. Alert and Oriented x 3. pleasant and cooperative. Notes Upon inspection patient's wound bed again showed signs of being somewhat dry I think that the [facility] is done well but at this time my suggestion and opinion is that we probably want to switch over to a collagen dressing to see if we can help this to heal a little bit more quickly and effectively. She is in agreement with giving this a trial. Electronic Signature(s) Signed: 12/03/2021 11:02:35 AM By: Worthy Keeler PA-C Entered By: Worthy Keeler on 12/03/2021 11:02:35 [name] [name] ([id number]) -------------------------------------------------------------------------------- Physician Orders Details Patient Name: [name] [name] Date of Service: 12/03/2021 10:15 AM Medical Record Number: [id number] Patient Account Number: [id number] Date of Birth/Sex: 1934-12-12 (86 y.o. 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 Bathing/ Shower/ Hygiene o May shower with wound dressing protected with  water repellent cover or cast protector. o No tub bath. Anesthetic (Use 'Patient Medications' Section for Anesthetic Order Entry) o Lidocaine applied to wound bed Edema Control - Lymphedema / Segmental Compressive Device / Other o Optional: One layer of unna paste to top of compression wrap (to act as an anchor). o Elevate, Exercise Daily and Avoid Standing for Long Periods of Time. o Elevate legs to the level of the heart and pump ankles as often as possible o Elevate leg(s) parallel to the floor when sitting. Wound Treatment Wound #1 - Lower Leg Wound Laterality: Left, Anterior Cleanser: Soap and Water 1 x Per Week/30 Days Discharge Instructions: Gently cleanse wound with antibacterial soap, rinse and pat dry prior to dressing wounds Topical: Triamcinolone Acetonide Cream, 0.1%, 15 (g) tube 1 x Per Week/30 Days Discharge Instructions: Apply as directed by provider. Primary Dressing: Promogran Matrix 4.34 (in) 1 x Per Week/30 Days Discharge Instructions: Moisten w/normal saline or sterile water; Cover wound as directed. Do not remove from wound bed. Secondary Dressing: Gauze 1 x Per Week/30 Days Discharge Instructions: As directed: dry, moistened with saline or moistened with Dakins Solution Compression Wrap: Profore Lite LF 3 Multilayer Compression Bandaging System 1 x Per Week/30 Days Discharge Instructions: Apply 3 multi-layer wrap as prescribed. Electronic Signature(s) Signed: 12/03/2021 4:34:41 PM By: Worthy Keeler PA-C Signed: 12/04/2021 1:59:18 PM By: Carlene Coria RN Entered By: Carlene Coria on 12/03/2021 10:05:32 [name] [name] ([id number]) -------------------------------------------------------------------------------- Problem List Details Patient Name: [name] [name] Date of Service: 12/03/2021 10:15 AM Medical Record Number: [id number] Patient Account Number: [id number] Date of Birth/Sex: May 05, 1935 (86 y.o. F) Treating RN: Carlene Coria Primary Care Provider:  Georgian Co Other Clinician: Referring Provider: Georgian Co Treating Provider/Extender: Skipper Cliche in Treatment: 2 Active Problems ICD-10 Encounter Code Description Active Date MDM Diagnosis I87.2 Venous insufficiency (chronic) (peripheral) 11/13/2021 No Yes L97.822 Non-pressure chronic ulcer of other part of left lower leg with fat layer 11/13/2021 No Yes exposed  S34.196 Non-pressure chronic ulcer of left ankle with fat layer exposed 11/13/2021 No Yes I48.0 Paroxysmal atrial fibrillation 11/13/2021 No Yes I10 Essential (primary) hypertension 11/13/2021 No Yes I50.42 Chronic combined systolic (congestive) and diastolic (congestive) heart 11/13/2021 No Yes failure Inactive Problems Resolved Problems Electronic Signature(s) Signed: 12/03/2021 9:39:26 AM By: Worthy Keeler PA-C Entered By: Worthy Keeler on 12/03/2021 09:39:25 [name] [name] ([id number]) -------------------------------------------------------------------------------- Progress Note Details Patient Name: [name] [name] Date of Service: 12/03/2021 10:15 AM Medical Record Number: [id number] Patient Account Number: [id number] Date of Birth/Sex: Apr 13, 1935 (86 y.o. F) Treating RN: Carlene Coria Primary Care Provider: Georgian Co Other Clinician: Referring Provider: Georgian Co Treating Provider/Extender: Skipper Cliche in Treatment: 2 Subjective Chief Complaint Information obtained from Patient Left leg and left ankle ulcers History of Present Illness (HPI) 11/13/2021 upon evaluation today patient presents for initial inspection here in our clinic concerning issues that she has been having with a wound over her left leg at both the leg and ankle location currently. This began somewhere around 2-3 maybe even 4 weeks ago based on what she is telling us today. Nonetheless I do believe that she has a history of chronic venous insufficiency, she also has atrial fibrillation, hypertension, and congestive heart  failure. Her leg has been swelling quite a bit here. We noted ABIs to be noncompressible but she does seem to have a pretty good pulse noted on Doppler as well as by palpation. She tells me that the wound on her shin occurred as a result of accidentally striking her self with a bag of groceries it was not even that hard. The one on the ankle which she attributes to shoes that her sister got her that she feels like "somebody wore before she got on and cause this issue". 11/19/2021 upon evaluation today patient appears to be doing well with regard to her wound. I am actually very pleased with where things stand today. I do not see any signs of active infection at this time which is great news and overall I think that she is making good progress here. Her blood pressure is still up again today however. She tells me she was very nervous driving and she is not used to this area and was afraid of there being fog nonetheless I still think this is something that her primary keeps a close track on they see her every 4 weeks especially with her previous heart attack for which she is still on blood thinners as well. 1/16; the patient has 2 wounds on the left leg 1 in the lower mid tibia and one on the lateral lower leg just above the lateral malleolus.. We have been using Hydrofera Blue. Both areas are measuring smaller. The patient has significant venous hypertension perhaps solar skin damageas well 12/03/2021 upon evaluation today patient's wound is actually showing signs of good improvement which is great news. Fortunately I do not see any evidence of active infection at this time which is great news as well. No fevers, chills, nausea, vomiting, or diarrhea. Objective Constitutional Well-nourished and well-hydrated in no acute distress. Vitals Time Taken: 9:28 AM, Height: 67 in, Weight: 112 lbs, BMI: 17.5, Temperature: 98.2 F, Pulse: 69 bpm, Respiratory Rate: 18 breaths/min, Blood Pressure: 179/67  mmHg. Respiratory normal breathing without difficulty. Psychiatric this patient is able to make decisions and demonstrates good insight into disease process. Alert and Oriented x 3. pleasant and cooperative. General Notes: Upon inspection patient's wound bed again showed signs of being somewhat dry I think  that the [facility] is done well but at this time my suggestion and opinion is that we probably want to switch over to a collagen dressing to see if we can help this to heal a little bit more quickly and effectively. She is in agreement with giving this a trial. Integumentary (Hair, Skin) Wound #1 status is Open. Original cause of wound was Trauma. The date acquired was: 10/11/2021. The wound has been in treatment 2 weeks. The wound is located on the Left,Anterior Lower Leg. The wound measures 0.7cm length x 0.5cm width x 0.1cm depth; 0.275cm^2 area and 0.027cm^3 volume. There is Fat Layer (Subcutaneous Tissue) exposed. There is no tunneling or undermining noted. There is a medium amount of serosanguineous drainage noted. There is large (67-100%) red, hyper - granulation within the wound bed. There is a small (1-33%) amount of necrotic tissue within the wound bed including Adherent Slough. Wound #2 status is Open. Original cause of wound was Trauma. The date acquired was: 10/24/2021. The wound has been in treatment 2 weeks. The wound is located on the Left,Lateral Ankle. The wound measures 0cm length x 0cm width x 0cm depth; 0cm^2 area and 0cm^3 volume. There is no tunneling or undermining noted. There is a none present amount of drainage noted. There is no granulation within the wound bed. [name] [name], [name] [name] ([id number]) There is no necrotic tissue within the wound bed. Assessment Active Problems ICD-10 Venous insufficiency (chronic) (peripheral) Non-pressure chronic ulcer of other part of left lower leg with fat layer exposed Non-pressure chronic ulcer of left ankle with fat layer  exposed Paroxysmal atrial fibrillation Essential (primary) hypertension Chronic combined systolic (congestive) and diastolic (congestive) heart failure Procedures Wound #1 Pre-procedure diagnosis of Wound #1 is a Venous Leg Ulcer located on the Left,Anterior Lower Leg . There was a Three Layer Compression Therapy Procedure by Carlene Coria, RN. Post procedure Diagnosis Wound #1: Same as Pre-Procedure Plan Follow-up Appointments: Return Appointment in 1 week. Bathing/ Shower/ Hygiene: May shower with wound dressing protected with water repellent cover or cast protector. No tub bath. Anesthetic (Use 'Patient Medications' Section for Anesthetic Order Entry): Lidocaine applied to wound bed Edema Control - Lymphedema / Segmental Compressive Device / Other: Optional: One layer of unna paste to top of compression wrap (to act as an anchor). Elevate, Exercise Daily and Avoid Standing for Long Periods of Time. Elevate legs to the level of the heart and pump ankles as often as possible Elevate leg(s) parallel to the floor when sitting. WOUND #1: - Lower Leg Wound Laterality: Left, Anterior Cleanser: Soap and Water 1 x Per Week/30 Days Discharge Instructions: Gently cleanse wound with antibacterial soap, rinse and pat dry prior to dressing wounds Topical: Triamcinolone Acetonide Cream, 0.1%, 15 (g) tube 1 x Per Week/30 Days Discharge Instructions: Apply as directed by provider. Primary Dressing: Promogran Matrix 4.34 (in) 1 x Per Week/30 Days Discharge Instructions: Moisten w/normal saline or sterile water; Cover wound as directed. Do not remove from wound bed. Secondary Dressing: Gauze 1 x Per Week/30 Days Discharge Instructions: As directed: dry, moistened with saline or moistened with Dakins Solution Compression Wrap: Profore Lite LF 3 Multilayer Compression Bandaging System 1 x Per Week/30 Days Discharge Instructions: Apply 3 multi-layer wrap as prescribed. 1. Would recommend that we  continue with a 3 layer compression wrap which I think is doing a good job for her. We will use a little bit of triamcinolone underneath to help with any discomfort and itching. 2. I am also can recommend  switching to the silver collagen dressing I think this is probably can to be the best bet at this time and the patient is in agreement with the plan. 3. I am also can recommend that we have the patient continue with the elevation of her legs much as possible to help with edema control. We will see patient back for reevaluation in 1 week here in the clinic. If anything worsens or changes patient will contact our office for additional recommendations. Electronic Signature(s) [name] [name], [name] [name] ([id number]) Signed: 12/03/2021 11:03:18 AM By: Worthy Keeler PA-C Entered By: Worthy Keeler on 12/03/2021 11:03:17 [name] [name] ([id number]) -------------------------------------------------------------------------------- SuperBill Details Patient Name: [name] [name] Date of Service: 12/03/2021 Medical Record Number: [id number] Patient Account Number: [id number] Date of Birth/Sex: 1934-11-15 (86 y.o. F) Treating RN: Carlene Coria Primary Care Provider: Georgian Co Other Clinician: Referring Provider: Georgian Co Treating Provider/Extender: Skipper Cliche in Treatment: 2 Diagnosis Coding ICD-10 Codes Code Description I87.2 Venous insufficiency (chronic) (peripheral) L97.822 Non-pressure chronic ulcer of other part of left lower leg with fat layer exposed L97.322 Non-pressure chronic ulcer of left ankle with fat layer exposed I48.0 Paroxysmal atrial fibrillation I10 Essential (primary) hypertension I50.42 Chronic combined systolic (congestive) and diastolic (congestive) heart failure Facility Procedures CPT4 Code: [id number] Description: (Facility Use Only) [id number] - [place] LWR LT LEG Modifier: Quantity: 1 Physician Procedures CPT4 Code: [id number] Description: 25638 - WC  PHYS LEVEL 4 - EST PT Modifier: Quantity: 1 CPT4 Code: Description: ICD-10 Diagnosis Description I87.2 Venous insufficiency (chronic) (peripheral) L97.822 Non-pressure chronic ulcer of other part of left lower leg with fat lay L97.322 Non-pressure chronic ulcer of left ankle with fat layer exposed I48.0  Paroxysmal atrial fibrillation Modifier: er exposed Quantity: Electronic Signature(s) Signed: 12/03/2021 11:03:38 AM By: Worthy Keeler PA-C Entered By: Worthy Keeler on 12/03/2021 11:03:38

## 2021-12-04 DIAGNOSIS — R935 Abnormal findings on diagnostic imaging of other abdominal regions, including retroperitoneum: Secondary | ICD-10-CM | POA: Diagnosis not present

## 2021-12-04 DIAGNOSIS — M6208 Separation of muscle (nontraumatic), other site: Secondary | ICD-10-CM | POA: Diagnosis not present

## 2021-12-04 DIAGNOSIS — R1930 Abdominal rigidity, unspecified site: Secondary | ICD-10-CM | POA: Diagnosis not present

## 2021-12-04 NOTE — Progress Notes (Signed)
EMMERSON, SHUFFIELD ([id number]) Visit Report for 12/03/2021 Arrival Information Details Patient Name: [name] [name], [name] [name] Date of Service: 12/03/2021 10:15 AM Medical Record Number: [id number] Patient Account Number: [id number] Date of Birth/Sex: 08/26/1935 (87 y.o. F) Treating RN: Carlene Coria Primary Care Sharlena Kristensen: [place] Other Clinician: Referring Primrose Oler: [place] Treating Lavoris Sparling/Extender: Skipper Cliche in Treatment: 2 Visit Information History Since Last Visit All ordered tests and consults were completed: No Patient Arrived: Ambulatory Added or deleted any medications: No Arrival Time: 09:23 Any new allergies or adverse reactions: No Accompanied By: self Had a fall or experienced change in No Transfer Assistance: None activities of daily living that may affect Patient Identification Verified: Yes risk of falls: Secondary Verification Process Completed: Yes Signs or symptoms of abuse/neglect since last visito No Patient Requires Transmission-Based No Hospitalized since last visit: No Precautions: Implantable device outside of the clinic excluding No Patient Has Alerts: Yes cellular tissue based products placed in the center Patient Alerts: Patient on Blood since last visit: Thinner Has Dressing in Place as Prescribed: Yes NON compressable Has Compression in Place as Prescribed: Yes Pain Present Now: No Electronic Signature(s) Signed: 12/04/2021 1:59:18 PM By: Carlene Coria RN Entered By: Carlene Coria on 12/03/2021 09:28:39 [name] [name] ([id number]) -------------------------------------------------------------------------------- Clinic Level of Care Assessment Details Patient Name: [name] [name] Date of Service: 12/03/2021 10:15 AM Medical Record Number: [id number] Patient Account Number: [id number] Date of Birth/Sex: [birth year] (86 y.o. F) Treating RN: Carlene Coria Primary Care Talon Witting: [place] Other Clinician: Referring Orah Sonnen: [place] Treating Khilynn Borntreger/Extender: Skipper Cliche in Treatment: 2 Clinic Level of Care Assessment Items TOOL 1 Quantity Score []  - Use when EandM and Procedure is performed on INITIAL visit 0 ASSESSMENTS - Nursing Assessment / Reassessment []  - General Physical Exam (combine w/ comprehensive assessment (listed just below) when performed on new 0 pt. evals) []  - 0 Comprehensive Assessment (HX, ROS, Risk Assessments, Wounds Hx, etc.) ASSESSMENTS - Wound and Skin Assessment / Reassessment []  - Dermatologic / Skin Assessment (not related to wound area) 0 ASSESSMENTS - Ostomy and/or Continence Assessment and Care []  - Incontinence Assessment and Management 0 []  - 0 Ostomy Care Assessment and Management (repouching, etc.) PROCESS - Coordination of Care []  - Simple Patient / Family Education for ongoing care 0 []  - 0 Complex (extensive) Patient / Family Education for ongoing care []  - 0 Staff obtains Programmer, systems, Records, Test Results / Process Orders []  - 0 Staff telephones HHA, Nursing Homes / Clarify orders / etc []  - 0 Routine Transfer to another Facility (non-emergent condition) []  - 0 Routine Hospital Admission (non-emergent condition) []  - 0 New Admissions / Biomedical engineer / Ordering NPWT, Apligraf, etc. []  - 0 Emergency Hospital Admission (emergent condition) PROCESS - Special Needs []  - Pediatric / Minor Patient Management 0 []  - 0 Isolation Patient Management []  - 0 Hearing / Language / Visual special needs []  - 0 Assessment of Community assistance (transportation, D/C planning, etc.) []  - 0 Additional assistance / Altered mentation []  - 0 Support Surface(s) Assessment (bed, cushion, seat, etc.) INTERVENTIONS - Miscellaneous []  - External ear exam 0 []  - 0 Patient Transfer (multiple staff / Civil Service fast streamer / Similar devices) []  - 0 Simple Staple / Suture removal (25 or less) []  - 0 Complex Staple / Suture removal (26 or more) []  - 0 Hypo/Hyperglycemic  Management (do not check if billed separately) []  - 0 Ankle / Brachial Index (ABI) - do not check if billed separately Has the patient been seen at the  hospital within the last three years: Yes Total Score: 0 Level Of Care: ____ [name] [name] ([id number]) Electronic Signature(s) Signed: 12/04/2021 1:59:18 PM By: Carlene Coria RN Entered By: Carlene Coria on 12/03/2021 10:04:41 [name] [name] ([id number]) -------------------------------------------------------------------------------- Compression Therapy Details Patient Name: [name] [name] Date of Service: 12/03/2021 10:15 AM Medical Record Number: [id number] Patient Account Number: [id number] Date of Birth/Sex: 04-17-1935 (86 y.o. F) Treating RN: Carlene Coria Primary Care Rayden Dock: [place] [employer] Other Clinician: Referring Davianna Deutschman: [place] Treating Treyshon Buchanon/Extender: Skipper Cliche in Treatment: 2 Compression Therapy Performed for Wound Assessment: Wound #1 Left,Anterior Lower Leg Performed By: Clinician Carlene Coria, RN Compression Type: Three Layer Post Procedure Diagnosis Same as Pre-procedure Electronic Signature(s) Signed: 12/04/2021 1:59:18 PM By: Carlene Coria RN Entered By: Carlene Coria on 12/03/2021 10:01:36 [name] [name] ([id number]) -------------------------------------------------------------------------------- Encounter Discharge Information Details Patient Name: [name] [name] Date of Service: 12/03/2021 10:15 AM Medical Record Number: [id number] Patient Account Number: [id number] Date of Birth/Sex: 1935/02/04 (86 y.o. F) Treating RN: Carlene Coria Primary Care Berlin Mokry: [place] [employer] Other Clinician: Referring Dov Dill: [place] Treating Jayci Ellefson/Extender: Skipper Cliche in Treatment: 2 Encounter Discharge Information Items Discharge Condition: Stable Ambulatory Status: Walker Discharge Destination: Home Transportation: Private Auto Accompanied By: self Schedule Follow-up Appointment:  Yes Clinical Summary of Care: Patient Declined Electronic Signature(s) Signed: 12/04/2021 1:59:18 PM By: Carlene Coria RN Entered By: Carlene Coria on 12/03/2021 10:22:48 [name] [name] ([id number]) -------------------------------------------------------------------------------- Lower Extremity Assessment Details Patient Name: [name] [name] Date of Service: 12/03/2021 10:15 AM Medical Record Number: [id number] Patient Account Number: [id number] Date of Birth/Sex: 1934/12/30 (86 y.o. F) Treating RN: Carlene Coria Primary Care Sharonna Vinje: [place] Other Clinician: Referring Machell Wirthlin: [place] Treating Marjon Doxtater/Extender: Jeri Cos Weeks in Treatment: 2 Edema Assessment Assessed: [Left: No] [Right: No] Edema: [Left: N] [Right: o] Calf Left: Right: Point of Measurement: 32 cm From Medial Instep 29 cm Ankle Left: Right: Point of Measurement: 10 cm From Medial Instep 18 cm Vascular Assessment Pulses: Dorsalis Pedis Palpable: [Left:Yes] Electronic Signature(s) Signed: 12/04/2021 1:59:18 PM By: Carlene Coria RN Entered By: Carlene Coria on 12/03/2021 09:35:45 Marrs, Rise Paganini ([id number]) -------------------------------------------------------------------------------- Multi Wound Chart Details Patient Name: [name] [name] Date of Service: 12/03/2021 10:15 AM Medical Record Number: [id number] Patient Account Number: [id number] Date of Birth/Sex: [birth year] (86 y.o. F) Treating RN: Carlene Coria Primary Care Savva Beamer: [place] [employer] Other Clinician: Referring Teriyah Purington: [place] [employer] Treating Haruna Rohlfs/Extender: Jeri Cos Weeks in Treatment: 2 Vital Signs Height(in): 67 Pulse(bpm): 2 Weight(lbs): 112 Blood Pressure(mmHg): 179/67 Body Mass Index(BMI): 17.5 Temperature(F): 98.2 Respiratory Rate(breaths/min): 18 Photos: [2:No Photos] [N/A:N/A] Wound Location: Left, Anterior Lower Leg Left, Lateral Ankle N/A Wounding Event: Trauma Trauma N/A Primary Etiology: Venous Leg  Ulcer Venous Leg Ulcer N/A Comorbid History: Congestive Heart Failure, Congestive Heart Failure, N/A Hypertension Hypertension Date Acquired: 10/11/2021 10/24/2021 N/A Weeks of Treatment: 2 2 N/A Wound Status: Open Open N/A Wound Recurrence: No No N/A Measurements L x W x D (cm) 0.7x0.5x0.1 0x0x0 N/A Area (cm) : 0.275 0 N/A Volume (cm) : 0.027 0 N/A % Reduction in Area: 65.00% 100.00% N/A % Reduction in Volume: 65.80% 100.00% N/A Classification: Full Thickness Without Exposed Full Thickness Without Exposed N/A Support Structures Support Structures Exudate Amount: Medium None Present N/A Exudate Type: Serosanguineous N/A N/A Exudate Color: red, brown N/A N/A Granulation Amount: Large (67-100%) None Present (0%) N/A Granulation Quality: Red, Hyper-granulation N/A N/A Necrotic Amount: Small (1-33%) None Present (0%) N/A Exposed Structures: Fat Layer (Subcutaneous Tissue): Fascia: No N/A Yes Fat Layer (Subcutaneous Tissue): Fascia: No No Tendon: No Tendon:  No Muscle: No Muscle: No Joint: No Joint: No Bone: No Bone: No Epithelialization: None Large (67-100%) N/A Treatment Notes Electronic Signature(s) Signed: 12/04/2021 1:59:18 PM By: Carlene Coria RN Entered By: Carlene Coria on 12/03/2021 09:59:28 [name] [name] ([id number]) -------------------------------------------------------------------------------- [place] Details Patient Name: [name] [name] Date of Service: 12/03/2021 10:15 AM Medical Record Number: [id number] Patient Account Number: [id number] Date of Birth/Sex: 1935-08-21 (86 y.o. F) Treating RN: Carlene Coria Primary Care Afshin Chrystal: [place] Other Clinician: Referring Phoenyx Melka: [place] Treating Jamille Yoshino/Extender: Skipper Cliche in Treatment: 2 Active Inactive Wound/Skin Impairment Nursing Diagnoses: Knowledge deficit related to ulceration/compromised skin integrity Goals: Patient/caregiver will verbalize understanding of  skin care regimen Date Initiated: 11/13/2021 Target Resolution Date: 12/14/2021 Goal Status: Active Ulcer/skin breakdown will have a volume reduction of 30% by week 4 Date Initiated: 11/13/2021 Target Resolution Date: 01/11/2022 Goal Status: Active Ulcer/skin breakdown will have a volume reduction of 50% by week 8 Date Initiated: 11/13/2021 Target Resolution Date: 02/11/2022 Goal Status: Active Ulcer/skin breakdown will have a volume reduction of 80% by week 12 Date Initiated: 11/13/2021 Target Resolution Date: 03/13/2022 Goal Status: Active Ulcer/skin breakdown will heal within 14 weeks Date Initiated: 11/13/2021 Target Resolution Date: 04/13/2022 Goal Status: Active Interventions: Assess patient/caregiver ability to obtain necessary supplies Assess patient/caregiver ability to perform ulcer/skin care regimen upon admission and as needed Assess ulceration(s) every visit Notes: Electronic Signature(s) Signed: 12/04/2021 1:59:18 PM By: Carlene Coria RN Entered By: Carlene Coria on 12/03/2021 09:59:10 [name] [name] ([id number]) -------------------------------------------------------------------------------- Pain Assessment Details Patient Name: [name] [name] Date of Service: 12/03/2021 10:15 AM Medical Record Number: [id number] Patient Account Number: [id number] Date of Birth/Sex: [birth year] (87 y.o. F) Treating RN: Carlene Coria Primary Care Cerina Leary: [place] Other Clinician: Referring Tiras Bianchini: [place] Treating Thaxton Pelley/Extender: Skipper Cliche in Treatment: 2 Active Problems Location of Pain Severity and Description of Pain Patient Has Paino No Site Locations Pain Management and Medication Current Pain Management: Electronic Signature(s) Signed: 12/04/2021 1:59:18 PM By: Carlene Coria RN Entered By: Carlene Coria on 12/03/2021 09:29:14 [name] [name] ([id number]) -------------------------------------------------------------------------------- Patient/Caregiver Education  Details Patient Name: [name] [name] Date of Service: 12/03/2021 10:15 AM Medical Record Number: [id number] Patient Account Number: [id number] Date of Birth/Gender: Jan 25, 1935 (86 y.o. F) Treating RN: Carlene Coria Primary Care Physician: [place] Co Other Clinician: Referring Physician: [place] Treating Physician/Extender: Skipper Cliche in Treatment: 2 Education Assessment Education Provided To: Patient Education Topics Provided Wound/Skin Impairment: Methods: Explain/Verbal Responses: State content correctly Electronic Signature(s) Signed: 12/04/2021 1:59:18 PM By: Carlene Coria RN Entered By: Carlene Coria on 12/03/2021 10:22:11 [name] [name] ([id number]) -------------------------------------------------------------------------------- Wound Assessment Details Patient Name: [name] [name] Date of Service: 12/03/2021 10:15 AM Medical Record Number: [id number] Patient Account Number: [id number] Date of Birth/Sex: Mar 18, 1935 (86 y.o. F) Treating RN: Carlene Coria Primary Care Linde Wilensky: [place] Other Clinician: Referring Zarius Furr: [place] Treating Layci Stenglein/Extender: Jeri Cos Weeks in Treatment: 2 Wound Status Wound Number: 1 Primary Etiology: Venous Leg Ulcer Wound Location: Left, Anterior Lower Leg Wound Status: Open Wounding Event: Trauma Comorbid History: Congestive Heart Failure, Hypertension Date Acquired: 10/11/2021 Weeks Of Treatment: 2 Clustered Wound: No Photos Wound Measurements Length: (cm) 0.7 Width: (cm) 0.5 Depth: (cm) 0.1 Area: (cm) 0.275 Volume: (cm) 0.027 % Reduction in Area: 65% % Reduction in Volume: 65.8% Epithelialization: None Tunneling: No Undermining: No Wound Description Classification: Full Thickness Without Exposed Support Structures Exudate Amount: Medium Exudate Type: Serosanguineous Exudate Color: red, brown Foul Odor After Cleansing: No Slough/Fibrino Yes Wound Bed Granulation Amount: Large (67-100%)  Exposed Structure Granulation  Quality: Red, Hyper-granulation Fascia Exposed: No Necrotic Amount: Small (1-33%) Fat Layer (Subcutaneous Tissue) Exposed: Yes Necrotic Quality: Adherent Slough Tendon Exposed: No Muscle Exposed: No Joint Exposed: No Bone Exposed: No Treatment Notes Wound #1 (Lower Leg) Wound Laterality: Left, Anterior Cleanser Soap and Water Discharge Instruction: Gently cleanse wound with antibacterial soap, rinse and pat dry prior to dressing wounds Peri-Wound Care Tuma, Shamiracle ([id number]) Topical Triamcinolone Acetonide Cream, 0.1%, 15 (g) tube Discharge Instruction: Apply as directed by Yuette Putnam. Primary Dressing Promogran Matrix 4.34 (in) Discharge Instruction: Moisten w/normal saline or sterile water; Cover wound as directed. Do not remove from wound bed. Secondary Dressing Gauze Discharge Instruction: As directed: dry, moistened with saline or moistened with Dakins Solution Secured With Compression Wrap Profore Lite LF 3 Multilayer Compression Bandaging System Discharge Instruction: Apply 3 multi-layer wrap as prescribed. Compression Stockings Add-Ons Electronic Signature(s) Signed: 12/04/2021 1:59:18 PM By: Carlene Coria RN Entered By: Carlene Coria on 12/03/2021 09:34:58 [name] [name] ([id number]) -------------------------------------------------------------------------------- Wound Assessment Details Patient Name: [name] [name] Date of Service: 12/03/2021 10:15 AM Medical Record Number: [id number] Patient Account Number: [id number] Date of Birth/Sex: 1935-01-30 (86 y.o. F) Treating RN: Carlene Coria Primary Care Charnika Herbst: [place] Other Clinician: Referring Darchelle Nunes: [place] Treating Arena Lindahl/Extender: Jeri Cos Weeks in Treatment: 2 Wound Status Wound Number: 2 Primary Etiology: Venous Leg Ulcer Wound Location: Left, Lateral Ankle Wound Status: Open Wounding Event: Trauma Comorbid History: Congestive Heart Failure,  Hypertension Date Acquired: 10/24/2021 Weeks Of Treatment: 2 Clustered Wound: No Wound Measurements Length: (cm) 0 Width: (cm) 0 Depth: (cm) 0 Area: (cm) 0 Volume: (cm) 0 % Reduction in Area: 100% % Reduction in Volume: 100% Epithelialization: Large (67-100%) Tunneling: No Undermining: No Wound Description Classification: Full Thickness Without Exposed Support Structures Exudate Amount: None Present Foul Odor After Cleansing: No Slough/Fibrino No Wound Bed Granulation Amount: None Present (0%) Exposed Structure Necrotic Amount: None Present (0%) Fascia Exposed: No Fat Layer (Subcutaneous Tissue) Exposed: No Tendon Exposed: No Muscle Exposed: No Joint Exposed: No Bone Exposed: No Electronic Signature(s) Signed: 12/04/2021 1:59:18 PM By: Carlene Coria RN Entered By: Carlene Coria on 12/03/2021 09:35:15 [name] [name] ([id number]) -------------------------------------------------------------------------------- [place] Details Patient Name: [name] [name] Date of Service: 12/03/2021 10:15 AM Medical Record Number: [id number] Patient Account Number: [id number] Date of Birth/Sex: November 07, 1935 (86 y.o. F) Treating RN: Carlene Coria Primary Care Eevie Lapp: [place] Other Clinician: Referring Abdulmalik Darco: [place] Treating Rayden Scheper/Extender: Skipper Cliche in Treatment: 2 Vital Signs Time Taken: 09:28 Temperature (F): 98.2 Height (in): 67 Pulse (bpm): 69 Weight (lbs): 112 Respiratory Rate (breaths/min): 18 Body Mass Index (BMI): 17.5 Blood Pressure (mmHg): 179/67 Reference Range: 80 - 120 mg / dl Electronic Signature(s) Signed: 12/04/2021 1:59:18 PM By: Carlene Coria RN Entered By: Carlene Coria on 12/03/2021 09:29:04

## 2021-12-07 ENCOUNTER — Emergency Department
Admission: EM | Admit: 2021-12-07 | Discharge: 2021-12-07 | Disposition: A | Discharge status: Home / Self Care | Payer: Medicare Other | Attending: Emergency Medicine | Admitting: Emergency Medicine

## 2021-12-07 ENCOUNTER — Emergency Department: Payer: Medicare Other

## 2021-12-07 ENCOUNTER — Other Ambulatory Visit: Payer: Self-pay

## 2021-12-07 DIAGNOSIS — S0990XA Unspecified injury of head, initial encounter: Secondary | ICD-10-CM | POA: Diagnosis present

## 2021-12-07 DIAGNOSIS — Z7901 Long term (current) use of anticoagulants: Secondary | ICD-10-CM | POA: Insufficient documentation

## 2021-12-07 DIAGNOSIS — S0101XA Laceration without foreign body of scalp, initial encounter: Secondary | ICD-10-CM | POA: Diagnosis not present

## 2021-12-07 DIAGNOSIS — S0003XA Contusion of scalp, initial encounter: Secondary | ICD-10-CM | POA: Diagnosis not present

## 2021-12-07 DIAGNOSIS — R6889 Other general symptoms and signs: Secondary | ICD-10-CM | POA: Diagnosis not present

## 2021-12-07 DIAGNOSIS — Y92481 Parking lot as the place of occurrence of the external cause: Secondary | ICD-10-CM | POA: Insufficient documentation

## 2021-12-07 DIAGNOSIS — I509 Heart failure, unspecified: Secondary | ICD-10-CM | POA: Insufficient documentation

## 2021-12-07 DIAGNOSIS — I2510 Atherosclerotic heart disease of native coronary artery without angina pectoris: Secondary | ICD-10-CM | POA: Insufficient documentation

## 2021-12-07 DIAGNOSIS — W19XXXA Unspecified fall, initial encounter: Secondary | ICD-10-CM

## 2021-12-07 DIAGNOSIS — Z7982 Long term (current) use of aspirin: Secondary | ICD-10-CM | POA: Diagnosis not present

## 2021-12-07 DIAGNOSIS — I110 Hypertensive heart disease with heart failure: Secondary | ICD-10-CM | POA: Insufficient documentation

## 2021-12-07 DIAGNOSIS — Y9301 Activity, walking, marching and hiking: Secondary | ICD-10-CM | POA: Diagnosis not present

## 2021-12-07 DIAGNOSIS — I10 Essential (primary) hypertension: Secondary | ICD-10-CM | POA: Diagnosis not present

## 2021-12-07 DIAGNOSIS — R0902 Hypoxemia: Secondary | ICD-10-CM | POA: Diagnosis not present

## 2021-12-07 DIAGNOSIS — W01198A Fall on same level from slipping, tripping and stumbling with subsequent striking against other object, initial encounter: Secondary | ICD-10-CM | POA: Diagnosis not present

## 2021-12-07 DIAGNOSIS — S199XXA Unspecified injury of neck, initial encounter: Secondary | ICD-10-CM | POA: Diagnosis not present

## 2021-12-07 DIAGNOSIS — R58 Hemorrhage, not elsewhere classified: Secondary | ICD-10-CM | POA: Diagnosis not present

## 2021-12-07 DIAGNOSIS — R52 Pain, unspecified: Secondary | ICD-10-CM | POA: Diagnosis not present

## 2021-12-07 MED ORDER — LIDOCAINE-EPINEPHRINE 2 %-1:100000 IJ SOLN
20.0000 mL | Freq: Once | INTRAMUSCULAR | Status: DC
  Filled 2021-12-07: qty 1

## 2021-12-07 NOTE — ED Triage Notes (Signed)
BIB EMS from grocery store. Lives at home. Witnessed fall outside in parking lot. Struck head. Does take blood thinners. Actiuvely bleding on scene. Bleding controlled by EMS.  Pt denies LOC and vision changes or N/V.  Vitals  225/97 96% on RA.

## 2021-12-07 NOTE — ED Provider Notes (Signed)
[facility] Provider Note    Event Date/Time   First MD Initiated Contact with Patient 12/07/21 1141     (approximate)   History   Chief Complaint Fall  HPI  [name] [name] is a 87 y.o. female with past medical history of hypertension, CAD, CHF, and chronic pain syndrome who presents to the ED complaining of fall.  Patient reports that just prior to arrival she was walking in the parking lot using her walker when she stepped on a small stone, causing her to lose her balance and fall backwards.  She struck the back of her head but denies losing consciousness, now complains of a headache.  She denies any neck pain, back pain, abdominal pain, or extremity pain.  She currently takes aspirin and Plavix for anticoagulation, EMS reports bleeding from wound to the back of her head on scene, for which hemostatic dressing was applied.     Physical Exam   Triage Vital Signs: ED Triage Vitals  Enc Vitals Group     BP 12/07/21 1135 (!) 155/108     Pulse --      Resp 12/07/21 1135 16     Temp --      Temp src --      SpO2 12/07/21 1135 96 %     Weight 12/07/21 1137 105 lb (47.6 kg)     Height 12/07/21 1137 5\' 5"  (1.651 m)     Head Circumference --      Peak Flow --      Pain Score 12/07/21 1137 0     Pain Loc --      Pain Edu? --      Excl. in [place]? --     Most recent vital signs: Vitals:   12/07/21 1230 12/07/21 1338  BP:  (!) 150/100  Pulse: 74 75  Resp: 15 16  Temp:  98 F (36.7 C)  SpO2: 98% 95%    Constitutional: Alert and oriented. Eyes: Conjunctivae are normal. Head: Posterior scalp hematoma with active bleeding noted. Nose: No congestion/rhinnorhea. Mouth/Throat: Mucous membranes are moist.  Neck: No midline cervical spine tenderness to palpation. Cardiovascular: Normal rate, regular rhythm. Grossly normal heart sounds.  2+ radial pulses bilaterally. Respiratory: Normal respiratory effort.  No retractions. Lungs CTAB. Gastrointestinal: Soft  and nontender. No distention. Musculoskeletal: No lower extremity tenderness nor edema.  No upper extremity bony tenderness to palpation. Neurologic:  Normal speech and language. No gross focal neurologic deficits are appreciated.    ED Results / Procedures / Treatments   Labs (all labs ordered are listed, but only abnormal results are displayed) Labs Reviewed - No data to display   EKG  ED ECG REPORT I, Blake Divine, the attending physician, personally viewed and interpreted this ECG.   Date: 12/07/2021  EKG Time: 11:38  Rate: 84  Rhythm: normal sinus rhythm, PAC's noted  Axis: Normal  Intervals:none  ST&T Change: None  RADIOLOGY CT head with no intracranial bleeding or midline shift noted.  PROCEDURES:  Critical Care performed: No  ..Laceration Repair  Date/Time: 12/07/2021 1:43 PM Performed by: Blake Divine, MD Authorized by: Blake Divine, MD   Consent:    Consent obtained:  Verbal   Consent given by:  Patient Universal protocol:    Patient identity confirmed:  Verbally with patient and arm band Anesthesia:    Anesthesia method:  Local infiltration   Local anesthetic:  Lidocaine 2% WITH epi Laceration details:    Location:  Scalp  Scalp location:  R parietal   Length (cm):  1 Pre-procedure details:    Preparation:  Patient was prepped and draped in usual sterile fashion and imaging obtained to evaluate for foreign bodies Exploration:    Limited defect created (wound extended): no     Hemostasis achieved with:  Epinephrine and direct pressure   Imaging obtained comment:  CT   Imaging outcome: foreign body not noted     Wound exploration: wound explored through full range of motion and entire depth of wound visualized     Wound extent: no areolar tissue violation noted, no fascia violation noted, no foreign bodies/material noted, no muscle damage noted, no nerve damage noted, no tendon damage noted and no underlying fracture noted     Contaminated:  no   Treatment:    Area cleansed with:  Saline   Amount of cleaning:  Standard   Irrigation solution:  Sterile saline   Irrigation method:  Pressure wash   Visualized foreign bodies/material removed: no     Debridement:  None   Undermining:  None   Scar revision: no   Skin repair:    Repair method:  Sutures   Suture size:  4-0   Suture material:  Nylon   Suture technique:  Figure eight   Number of sutures:  1 Approximation:    Approximation:  Close Repair type:    Repair type:  Simple Post-procedure details:    Dressing:  Non-adherent dressing   Procedure completion:  Tolerated well, no immediate complications   MEDICATIONS ORDERED IN ED: Medications  lidocaine-EPINEPHrine (XYLOCAINE W/EPI) 2 %-1:100000 (with pres) injection 20 mL (has no administration in time range)     IMPRESSION / MDM / ASSESSMENT AND PLAN / ED COURSE  I reviewed the triage vital signs and the nursing notes.                              87 y.o. female with past medical history of hypertension, CAD, CHF, and chronic pain syndrome who presents to the ED following fall where she tripped on a stone and fell backwards, striking her head, noted to have bleeding wound per EMS.  Differential diagnosis includes, but is not limited to, intracranial injury, skull fracture, cervical spine injury, bleeding laceration.  Patient nontoxic-appearing and in no acute distress, did strike the back of her head and has apparent hematoma that is actively bleeding.  Hemostatic dressing was applied with cessation of bleeding, we will check CT head and cervical spine for traumatic injury.  EKG shows normal sinus rhythm with frequent PACs, no indication for labs given mechanical fall and patient remains at her baseline mental status.  CT head and cervical spine are negative for acute process.  With removal of prior dressing, patient noted to have small area of arteriolar bleeding which was controlled with 1 figure-of-eight stitch  and application of Surgicel dressing.  Patient was observed for about 1 hour afterwards with no recurrence of bleeding, is now appropriate for discharge home with outpatient follow-up for suture removal in one week.  She was counseled to return to the ED for new worsening symptoms, patient agrees with plan.       FINAL CLINICAL IMPRESSION(S) / ED DIAGNOSES   Final diagnoses:  Fall, initial encounter  Laceration of scalp, initial encounter     Rx / DC Orders   ED Discharge Orders     None  Note:  This document was prepared using Dragon voice recognition software and may include unintentional dictation errors.   Blake Divine, MD 12/07/21 1346

## 2021-12-11 ENCOUNTER — Other Ambulatory Visit: Payer: Self-pay

## 2021-12-11 ENCOUNTER — Encounter: Payer: Medicare Other | Admitting: Physician Assistant

## 2021-12-11 DIAGNOSIS — I5042 Chronic combined systolic (congestive) and diastolic (congestive) heart failure: Secondary | ICD-10-CM | POA: Diagnosis not present

## 2021-12-11 DIAGNOSIS — L97822 Non-pressure chronic ulcer of other part of left lower leg with fat layer exposed: Secondary | ICD-10-CM | POA: Diagnosis not present

## 2021-12-11 DIAGNOSIS — I872 Venous insufficiency (chronic) (peripheral): Secondary | ICD-10-CM | POA: Diagnosis not present

## 2021-12-11 DIAGNOSIS — I10 Essential (primary) hypertension: Secondary | ICD-10-CM | POA: Diagnosis not present

## 2021-12-11 DIAGNOSIS — I480 Paroxysmal atrial fibrillation: Secondary | ICD-10-CM | POA: Diagnosis not present

## 2021-12-11 DIAGNOSIS — I110 Hypertensive heart disease with heart failure: Secondary | ICD-10-CM | POA: Diagnosis not present

## 2021-12-11 DIAGNOSIS — Z7901 Long term (current) use of anticoagulants: Secondary | ICD-10-CM | POA: Diagnosis not present

## 2021-12-11 DIAGNOSIS — I252 Old myocardial infarction: Secondary | ICD-10-CM | POA: Diagnosis not present

## 2021-12-11 DIAGNOSIS — Z87891 Personal history of nicotine dependence: Secondary | ICD-10-CM | POA: Diagnosis not present

## 2021-12-11 DIAGNOSIS — L97322 Non-pressure chronic ulcer of left ankle with fat layer exposed: Secondary | ICD-10-CM | POA: Diagnosis not present
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
## 2021-12-12 DIAGNOSIS — S0101XA Laceration without foreign body of scalp, initial encounter: Secondary | ICD-10-CM | POA: Diagnosis not present

## 2021-12-12 DIAGNOSIS — S0003XA Contusion of scalp, initial encounter: Secondary | ICD-10-CM | POA: Diagnosis not present

## 2021-12-12 NOTE — Progress Notes (Signed)
[name], [name] ([id number]) Visit Report for 12/11/2021 Arrival Information Details Patient Name: [name], [name] Date of Service: 12/11/2021 11:00 AM Medical Record Number: [id number] Patient Account Number: [id number] Date of Birth/Sex: 09/24/1935 (87 y.o. F) Treating RN: Carlene Coria Primary Care Adryan Shin: [place] [employer] Other Clinician: Referring Eryca Bolte: [place] [employer] Treating Aashvi Rezabek/Extender: Skipper Cliche in Treatment: 4 Visit Information History Since Last Visit All ordered tests and consults were completed: No Patient Arrived: Gilford Rile Added or deleted any medications: No Arrival Time: 11:08 Any new allergies or adverse reactions: No Accompanied By: self Had a fall or experienced change in No Transfer Assistance: None activities of daily living that may affect Patient Identification Verified: Yes risk of falls: Secondary Verification Process Completed: Yes Signs or symptoms of abuse/neglect since last visito No Patient Requires Transmission-Based No Hospitalized since last visit: No Precautions: Implantable device outside of the clinic excluding No Patient Has Alerts: Yes cellular tissue based products placed in the center Patient Alerts: Patient on Blood since last visit: Thinner Has Dressing in Place as Prescribed: Yes NON compressable Has Compression in Place as Prescribed: Yes Pain Present Now: No Electronic Signature(s) Signed: 12/12/2021 1:16:38 PM By: Carlene Coria RN Entered By: Carlene Coria on 12/11/2021 11:12:09 [name] [name] ([id number]) -------------------------------------------------------------------------------- Clinic Level of Care Assessment Details Patient Name: [name] [name] Date of Service: 12/11/2021 11:00 AM Medical Record Number: [id number] Patient Account Number: [id number] Date of Birth/Sex: 1935/01/23 (87 y.o. F) Treating RN: Carlene Coria Primary Care Sarath Privott: [place] [employer] Other Clinician: Referring Charman Blasco: [place] [employer] Treating Amen Dargis/Extender: Skipper Cliche in Treatment: 4 Clinic Level of Care Assessment Items TOOL 1 Quantity Score []  - Use when EandM and Procedure is performed on INITIAL visit 0 ASSESSMENTS - Nursing Assessment / Reassessment []  - General Physical Exam (combine w/ comprehensive assessment (listed just below) when performed on new 0 pt. evals) []  - 0 Comprehensive Assessment (HX, ROS, Risk Assessments, Wounds Hx, etc.) ASSESSMENTS - Wound and Skin Assessment / Reassessment []  - Dermatologic / Skin Assessment (not related to wound area) 0 ASSESSMENTS - Ostomy and/or Continence Assessment and Care []  - Incontinence Assessment and Management 0 []  - 0 Ostomy Care Assessment and Management (repouching, etc.) PROCESS - Coordination of Care []  - Simple Patient / Family Education for ongoing care 0 []  - 0 Complex (extensive) Patient / Family Education for ongoing care []  - 0 Staff obtains Programmer, systems, Records, Test Results / Process Orders []  - 0 Staff telephones HHA, Nursing Homes / Clarify orders / etc []  - 0 Routine Transfer to another Facility (non-emergent condition) []  - 0 Routine Hospital Admission (non-emergent condition) []  - 0 New Admissions / Biomedical engineer / Ordering NPWT, Apligraf, etc. []  - 0 Emergency Hospital Admission (emergent condition) PROCESS - Special Needs []  - Pediatric / Minor Patient Management 0 []  - 0 Isolation Patient Management []  - 0 Hearing / Language / Visual special needs []  - 0 Assessment of Community assistance (transportation, D/C planning, etc.) []  - 0 Additional assistance / Altered mentation []  - 0 Support Surface(s) Assessment (bed, cushion, seat, etc.) INTERVENTIONS - Miscellaneous []  - External ear exam 0 []  - 0 Patient Transfer (multiple staff / Civil Service fast streamer / Similar devices) []  - 0 Simple Staple / Suture removal (25 or less) []  - 0 Complex Staple / Suture removal (26 or more) []  - 0 Hypo/Hyperglycemic  Management (do not check if billed separately) []  - 0 Ankle / Brachial Index (ABI) - do not check if billed separately Has the patient been seen at the  hospital within the last three years: Yes Total Score: 0 Level Of Care: ____ [name] [name] ([id number]) Electronic Signature(s) Signed: 12/12/2021 1:16:38 PM By: Carlene Coria RN Entered By: Carlene Coria on 12/11/2021 11:56:43 [name] [name] ([id number]) -------------------------------------------------------------------------------- Compression Therapy Details Patient Name: [name] [name] Date of Service: 12/11/2021 11:00 AM Medical Record Number: [id number] Patient Account Number: [id number] Date of Birth/Sex: 01-Jan-1935 (87 y.o. F) Treating RN: Carlene Coria Primary Care Jemal Miskell: [place] [employer] Other Clinician: Referring Yoland Scherr: [place] [employer] Treating Akio Hudnall/Extender: Skipper Cliche in Treatment: 4 Compression Therapy Performed for Wound Assessment: Wound #1 Left,Anterior Lower Leg Performed By: Clinician Carlene Coria, RN Compression Type: Three Layer Post Procedure Diagnosis Same as Pre-procedure Electronic Signature(s) Signed: 12/11/2021 11:55:46 AM By: Carlene Coria RN Entered By: Carlene Coria on 12/11/2021 11:55:45 [name] [name] ([id number]) -------------------------------------------------------------------------------- Encounter Discharge Information Details Patient Name: [name] [name] Date of Service: 12/11/2021 11:00 AM Medical Record Number: [id number] Patient Account Number: [id number] Date of Birth/Sex: [birth year] (87 y.o. F) Treating RN: Carlene Coria Primary Care Shayanne Gomm: [place] [employer] Other Clinician: Referring Melannie Metzner: [place] [employer] Treating Antwione Picotte/Extender: Skipper Cliche in Treatment: 4 Encounter Discharge Information Items Discharge Condition: Stable Ambulatory Status: Walker Discharge Destination: Home Transportation: Private Auto Accompanied By: self Schedule Follow-up Appointment:  Yes Clinical Summary of Care: Patient Declined Electronic Signature(s) Signed: 12/11/2021 11:58:39 AM By: Carlene Coria RN Entered By: Carlene Coria on 12/11/2021 11:58:39 [name] [name] ([id number]) -------------------------------------------------------------------------------- Lower Extremity Assessment Details Patient Name: [name] [name] Date of Service: 12/11/2021 11:00 AM Medical Record Number: [id number] Patient Account Number: [id number] Date of Birth/Sex: 24-Feb-1935 (87 y.o. F) Treating RN: Carlene Coria Primary Care Dharma Pare: [place] [employer] Other Clinician: Referring Abbegale Stehle: [place] [employer] Treating Buell Parcel/Extender: Jeri Cos Weeks in Treatment: 4 Edema Assessment Assessed: [Left: No] [Right: No] Edema: [Left: N] [Right: o] Calf Left: Right: Point of Measurement: 32 cm From Medial Instep 29 cm Ankle Left: Right: Point of Measurement: 10 cm From Medial Instep 18 cm Vascular Assessment Pulses: Dorsalis Pedis Palpable: [Left:Yes] Electronic Signature(s) Signed: 12/12/2021 1:16:38 PM By: Carlene Coria RN Entered By: Carlene Coria on 12/11/2021 11:21:34 [name] [name] ([id number]) -------------------------------------------------------------------------------- Multi Wound Chart Details Patient Name: [name] [name] Date of Service: 12/11/2021 11:00 AM Medical Record Number: [id number] Patient Account Number: [id number] Date of Birth/Sex: 05-31-1935 (87 y.o. F) Treating RN: Carlene Coria Primary Care Daejah Klebba: [place] [employer] Other Clinician: Referring Blaklee Shores: [place] [employer] Treating Ronette Hank/Extender: Skipper Cliche in Treatment: 4 Vital Signs Height(in): 67 Pulse(bpm): 9 Weight(lbs): 112 Blood Pressure(mmHg): 189/69 Body Mass Index(BMI): 17.5 Temperature(F): 98.3 Respiratory Rate(breaths/min): 18 Photos: [N/A:N/A] Wound Location: Left, Anterior Lower Leg N/A N/A Wounding Event: Trauma N/A N/A Primary Etiology: Venous Leg Ulcer N/A N/A Comorbid History:  Congestive Heart Failure, N/A N/A Hypertension Date Acquired: 10/11/2021 N/A N/A Weeks of Treatment: 4 N/A N/A Wound Status: Open N/A N/A Wound Recurrence: No N/A N/A Measurements L x W x D (cm) 0.1x0.1x0.01 N/A N/A Area (cm) : 0.008 N/A N/A Volume (cm) : 0 N/A N/A % Reduction in Area: 99.00% N/A N/A % Reduction in Volume: 100.00% N/A N/A Classification: Full Thickness Without Exposed N/A N/A Support Structures Exudate Amount: Medium N/A N/A Exudate Type: Serosanguineous N/A N/A Exudate Color: red, brown N/A N/A Granulation Amount: Large (67-100%) N/A N/A Granulation Quality: Red, Hyper-granulation N/A N/A Necrotic Amount: Small (1-33%) N/A N/A Exposed Structures: Fat Layer (Subcutaneous Tissue): N/A N/A Yes Fascia: No Tendon: No Muscle: No Joint: No Bone: No Epithelialization: None N/A N/A Treatment Notes Electronic Signature(s) Signed: 12/12/2021 1:16:38 PM By: Carlene Coria RN Entered By: Carlene Coria  on 12/11/2021 11:36:51 [name] [name], [name] [name] ([id number]) -------------------------------------------------------------------------------- [place] Details Patient Name: [name] [name], [name] [name] Date of Service: 12/11/2021 11:00 AM Medical Record Number: [id number] Patient Account Number: [id number] Date of Birth/Sex: Feb 22, 1935 (87 y.o. F) Treating RN: Carlene Coria Primary Care Jensyn Cambria: [place] [employer] Other Clinician: Referring Harding Thomure: [place] [employer] Treating Meyah Corle/Extender: Skipper Cliche in Treatment: 4 Active Inactive Wound/Skin Impairment Nursing Diagnoses: Knowledge deficit related to ulceration/compromised skin integrity Goals: Patient/caregiver will verbalize understanding of skin care regimen Date Initiated: 11/13/2021 Target Resolution Date: 12/14/2021 Goal Status: Active Ulcer/skin breakdown will have a volume reduction of 30% by week 4 Date Initiated: 11/13/2021 Target Resolution Date: 01/11/2022 Goal Status: Active Ulcer/skin breakdown will have a  volume reduction of 50% by week 8 Date Initiated: 11/13/2021 Target Resolution Date: 02/11/2022 Goal Status: Active Ulcer/skin breakdown will have a volume reduction of 80% by week 12 Date Initiated: 11/13/2021 Target Resolution Date: 03/13/2022 Goal Status: Active Ulcer/skin breakdown will heal within 14 weeks Date Initiated: 11/13/2021 Target Resolution Date: 04/13/2022 Goal Status: Active Interventions: Assess patient/caregiver ability to obtain necessary supplies Assess patient/caregiver ability to perform ulcer/skin care regimen upon admission and as needed Assess ulceration(s) every visit Notes: Electronic Signature(s) Signed: 12/12/2021 1:16:38 PM By: Carlene Coria RN Entered By: Carlene Coria on 12/11/2021 11:36:23 [name] [name] ([id number]) -------------------------------------------------------------------------------- Pain Assessment Details Patient Name: [name] [name] Date of Service: 12/11/2021 11:00 AM Medical Record Number: [id number] Patient Account Number: [id number] Date of Birth/Sex: 1935-05-04 (87 y.o. F) Treating RN: Carlene Coria Primary Care Tania Perrott: [place] [employer] Other Clinician: Referring Agustine Rossitto: [place] [employer] Treating Deshundra Waller/Extender: Skipper Cliche in Treatment: 4 Active Problems Location of Pain Severity and Description of Pain Patient Has Paino No Site Locations Pain Management and Medication Current Pain Management: Electronic Signature(s) Signed: 12/12/2021 1:16:38 PM By: Carlene Coria RN Entered By: Carlene Coria on 12/11/2021 11:13:26 [name] [name] ([id number]) -------------------------------------------------------------------------------- Patient/Caregiver Education Details Patient Name: [name] [name] Date of Service: 12/11/2021 11:00 AM Medical Record Number: [id number] Patient Account Number: [id number] Date of Birth/Gender: 06/22/1935 (87 y.o. F) Treating RN: Carlene Coria Primary Care Physician: [place] Other Clinician: Referring  Physician: [place] Treating Physician/Extender: Skipper Cliche in Treatment: 4 Education Assessment Education Provided To: Patient Education Topics Provided Wound/Skin Impairment: Methods: Explain/Verbal Responses: State content correctly Electronic Signature(s) Signed: 12/12/2021 1:16:38 PM By: Carlene Coria RN Entered By: Carlene Coria on 12/11/2021 11:57:53 [name] [name] ([id number]) -------------------------------------------------------------------------------- Wound Assessment Details Patient Name: [name] [name] Date of Service: 12/11/2021 11:00 AM Medical Record Number: [id number] Patient Account Number: [id number] Date of Birth/Sex: 10/20/1935 (87 y.o. F) Treating RN: Carlene Coria Primary Care Jacalyn Biggs: [place] [employer] Other Clinician: Referring Ely Spragg: [place] [employer] Treating Shatavia Santor/Extender: Jeri Cos Weeks in Treatment: 4 Wound Status Wound Number: 1 Primary Etiology: Venous Leg Ulcer Wound Location: Left, Anterior Lower Leg Wound Status: Open Wounding Event: Trauma Comorbid History: Congestive Heart Failure, Hypertension Date Acquired: 10/11/2021 Weeks Of Treatment: 4 Clustered Wound: No Photos Wound Measurements Length: (cm) 0.1 Width: (cm) 0.1 Depth: (cm) 0.01 Area: (cm) 0.008 Volume: (cm) 0 % Reduction in Area: 99% % Reduction in Volume: 100% Epithelialization: None Tunneling: No Undermining: No Wound Description Classification: Full Thickness Without Exposed Support Structures Exudate Amount: Medium Exudate Type: Serosanguineous Exudate Color: red, brown Foul Odor After Cleansing: No Slough/Fibrino Yes Wound Bed Granulation Amount: Large (67-100%) Exposed Structure Granulation Quality: Red, Hyper-granulation Fascia Exposed: No Necrotic Amount: Small (1-33%) Fat Layer (Subcutaneous Tissue) Exposed: Yes Necrotic Quality: Adherent Slough Tendon Exposed: No Muscle Exposed: No Joint Exposed: No Bone Exposed: No Treatment  Notes  Wound #1 (Lower Leg) Wound Laterality: Left, Anterior Cleanser Soap and Water Discharge Instruction: Gently cleanse wound with antibacterial soap, rinse and pat dry prior to dressing wounds Peri-Wound Care Ennen, Riana ([id number]) Topical Triamcinolone Acetonide Cream, 0.1%, 15 (g) tube Discharge Instruction: Apply as directed by Aeris Hersman. Primary Dressing Promogran Matrix 4.34 (in) Discharge Instruction: Moisten w/normal saline or sterile water; Cover wound as directed. Do not remove from wound bed. Secondary Dressing Gauze Discharge Instruction: As directed: dry, moistened with saline or moistened with Dakins Solution Secured With Compression Wrap Profore Lite LF 3 Multilayer Compression Bandaging System Discharge Instruction: Apply 3 multi-layer wrap as prescribed. Compression Stockings Add-Ons Electronic Signature(s) Signed: 12/12/2021 1:16:38 PM By: Carlene Coria RN Entered By: Carlene Coria on 12/11/2021 11:21:11 [name] [name] ([id number]) -------------------------------------------------------------------------------- Vitals Details Patient Name: [name] [name] Date of Service: 12/11/2021 11:00 AM Medical Record Number: [id number] Patient Account Number: [id number] Date of Birth/Sex: 26-Sep-1935 (87 y.o. F) Treating RN: Carlene Coria Primary Care Tannis Burstein: [place] Other Clinician: Referring Calaya Gildner: [place] [employer] Treating Markee Matera/Extender: Skipper Cliche in Treatment: 4 Vital Signs Time Taken: 11:12 Temperature (F): 98.3 Height (in): 67 Pulse (bpm): 73 Weight (lbs): 112 Respiratory Rate (breaths/min): 18 Body Mass Index (BMI): 17.5 Blood Pressure (mmHg): 189/69 Reference Range: 80 - 120 mg / dl Electronic Signature(s) Signed: 12/12/2021 1:16:38 PM By: Carlene Coria RN Entered By: Carlene Coria on 12/11/2021 11:13:15

## 2021-12-18 ENCOUNTER — Encounter: Payer: Medicare Other | Attending: Physician Assistant | Admitting: Physician Assistant

## 2021-12-18 ENCOUNTER — Other Ambulatory Visit: Payer: Self-pay

## 2021-12-18 DIAGNOSIS — L97322 Non-pressure chronic ulcer of left ankle with fat layer exposed: Secondary | ICD-10-CM | POA: Insufficient documentation

## 2021-12-18 DIAGNOSIS — I10 Essential (primary) hypertension: Secondary | ICD-10-CM | POA: Diagnosis not present

## 2021-12-18 DIAGNOSIS — Z7901 Long term (current) use of anticoagulants: Secondary | ICD-10-CM | POA: Insufficient documentation

## 2021-12-18 DIAGNOSIS — Z87891 Personal history of nicotine dependence: Secondary | ICD-10-CM | POA: Diagnosis not present

## 2021-12-18 DIAGNOSIS — I872 Venous insufficiency (chronic) (peripheral): Secondary | ICD-10-CM | POA: Diagnosis not present

## 2021-12-18 DIAGNOSIS — I480 Paroxysmal atrial fibrillation: Secondary | ICD-10-CM | POA: Diagnosis not present

## 2021-12-18 DIAGNOSIS — L97822 Non-pressure chronic ulcer of other part of left lower leg with fat layer exposed: Secondary | ICD-10-CM | POA: Insufficient documentation

## 2021-12-18 DIAGNOSIS — I252 Old myocardial infarction: Secondary | ICD-10-CM | POA: Diagnosis not present

## 2021-12-18 DIAGNOSIS — I5042 Chronic combined systolic (congestive) and diastolic (congestive) heart failure: Secondary | ICD-10-CM | POA: Insufficient documentation

## 2021-12-18 DIAGNOSIS — I110 Hypertensive heart disease with heart failure: Secondary | ICD-10-CM | POA: Insufficient documentation

## 2021-12-18 NOTE — Progress Notes (Addendum)
[name] [name] ([id number]) Visit Report for 12/18/2021 Chief Complaint Document Details Patient Name: [name] [name], [name] [name] Date of Service: 12/18/2021 11:00 AM Medical Record Number: [id number] Patient Account Number: [id number] Date of Birth/Sex: 01-23-1935 (87 y.o. F) Treating RN: Carlene Coria Primary Care Provider: Georgian Co Other Clinician: Referring Provider: Georgian Co Treating Provider/Extender: Skipper Cliche in Treatment: 5 Information Obtained from: Patient Chief Complaint Left leg and left ankle ulcers Electronic Signature(s) Signed: 12/18/2021 10:46:13 AM By: Worthy Keeler PA-C Entered By: Worthy Keeler on 12/18/2021 10:46:13 [name] [name] ([id number]) -------------------------------------------------------------------------------- HPI Details Patient Name: [name] [name] Date of Service: 12/18/2021 11:00 AM Medical Record Number: [id number] Patient Account Number: [id number] Date of Birth/Sex: [birth year] (86 y.o. F) Treating RN: Carlene Coria Primary Care Provider: Georgian Co Other Clinician: Referring Provider: Georgian Co Treating Provider/Extender: Skipper Cliche in Treatment: 5 History of Present Illness HPI Description: 11/13/2021 upon evaluation today patient presents for initial inspection here in our clinic concerning issues that she has been having with a wound over her left leg at both the leg and ankle location currently. This began somewhere around 2-3 maybe even 4 weeks ago based on what she is telling us today. Nonetheless I do believe that she has a history of chronic venous insufficiency, she also has atrial fibrillation, hypertension, and congestive heart failure. Her leg has been swelling quite a bit here. We noted ABIs to be noncompressible but she does seem to have a pretty good pulse noted on Doppler as well as by palpation. She tells me that the wound on her shin occurred as a result of accidentally striking her self with a bag of  groceries it was not even that hard. The one on the ankle which she attributes to shoes that her sister got her that she feels like "somebody wore before she got on and cause this issue". 11/19/2021 upon evaluation today patient appears to be doing well with regard to her wound. I am actually very pleased with where things stand today. I do not see any signs of active infection at this time which is great news and overall I think that she is making good progress here. Her blood pressure is still up again today however. She tells me she was very nervous driving and she is not used to this area and was afraid of there being fog nonetheless I still think this is something that her primary keeps a close track on they see her every 4 weeks especially with her previous heart attack for which she is still on blood thinners as well. 1/16; the patient has 2 wounds on the left leg 1 in the lower mid tibia and one on the lateral lower leg just above the lateral malleolus.. We have been using Hydrofera Blue. Both areas are measuring smaller. The patient has significant venous hypertension perhaps solar skin damageas well 12/03/2021 upon evaluation today patient's wound is actually showing signs of good improvement which is great news. Fortunately I do not see any evidence of active infection at this time which is great news as well. No fevers, chills, nausea, vomiting, or diarrhea. 12/11/2021 upon evaluation today patient unfortunately had a fall since I last saw her she has an extremely swollen black eye on the left. She did go to the ER fortunately with all the scans and everything checked out she did not seem to have any internal damage but externally definitely shows a lot of bruising. She also had to sutures placed in the scalp she is seeing  her primary care provider tomorrow they should be taken that out at that point. Subsequently with regard to her eye I explained her this is probably to get worse before it  gets better and overall I do not see anything that looks infected most what we see is just blood that is collected in and around the eye socket and subsequently this is probably can start spreading down her face as this progresses. 12/18/2021 upon evaluation today patient appears to be completely healed which is great news. Fortunately I do not see any signs of active infection at this time. Overall I am extremely happy with where we stand. Electronic Signature(s) Signed: 12/18/2021 11:35:07 AM By: Worthy Keeler PA-C Entered By: Worthy Keeler on 12/18/2021 11:35:07 [name] [name] ([id number]) -------------------------------------------------------------------------------- Physical Exam Details Patient Name: [name] [name] Date of Service: 12/18/2021 11:00 AM Medical Record Number: [id number] Patient Account Number: [id number] Date of Birth/Sex: 06-Jul-1935 (86 y.o. F) Treating RN: Carlene Coria Primary Care Provider: Georgian Co Other Clinician: Referring Provider: Georgian Co Treating Provider/Extender: Skipper Cliche in Treatment: 5 Constitutional Well-nourished and well-hydrated in no acute distress. Respiratory normal breathing without difficulty. Psychiatric this patient is able to make decisions and demonstrates good insight into disease process. Alert and Oriented x 3. pleasant and cooperative. Notes Patient showed complete epithelization and does not have anything open at this time which is also him. I do believe that she is ready for discharge she does have compression socks at home. Electronic Signature(s) Signed: 12/18/2021 11:35:30 AM By: Worthy Keeler PA-C Entered By: Worthy Keeler on 12/18/2021 11:35:30 [name] [name] ([id number]) -------------------------------------------------------------------------------- Physician Orders Details Patient Name: [name] [name] Date of Service: 12/18/2021 11:00 AM Medical Record Number: [id number] Patient Account Number:  [id number] Date of Birth/Sex: 08/06/1935 (86 y.o. F) Treating RN: Carlene Coria Primary Care Provider: Georgian Co Other Clinician: Referring Provider: Georgian Co Treating Provider/Extender: Skipper Cliche in Treatment: 5 Verbal / Phone Orders: No Diagnosis Coding ICD-10 Coding Code Description I87.2 Venous insufficiency (chronic) (peripheral) L97.822 Non-pressure chronic ulcer of other part of left lower leg with fat layer exposed L97.322 Non-pressure chronic ulcer of left ankle with fat layer exposed I48.0 Paroxysmal atrial fibrillation I10 Essential (primary) hypertension I50.42 Chronic combined systolic (congestive) and diastolic (congestive) heart failure Discharge From [facility] Services o Discharge from [facility] o Wear compression garments daily. Put garments on first thing when you wake up and remove them before bed. o Moisturize legs daily after removing compression garments. o Elevate, Exercise Daily and Avoid Standing for Long Periods of Time. Wound Treatment Electronic Signature(s) Signed: 12/18/2021 4:09:23 PM By: Worthy Keeler PA-C Signed: 12/20/2021 8:26:07 AM By: Carlene Coria RN Entered By: Carlene Coria on 12/18/2021 11:30:39 [name] [name] ([id number]) -------------------------------------------------------------------------------- Problem List Details Patient Name: [name] [name] Date of Service: 12/18/2021 11:00 AM Medical Record Number: [id number] Patient Account Number: [id number] Date of Birth/Sex: 1935-02-25 (86 y.o. F) Treating RN: Carlene Coria Primary Care Provider: Georgian Co Other Clinician: Referring Provider: Georgian Co Treating Provider/Extender: Skipper Cliche in Treatment: 5 Active Problems ICD-10 Encounter Code Description Active Date MDM Diagnosis I87.2 Venous insufficiency (chronic) (peripheral) 11/13/2021 No Yes L97.822 Non-pressure chronic ulcer of other part of left lower leg with fat layer  11/13/2021 No Yes exposed L97.322 Non-pressure chronic ulcer of left ankle with fat layer exposed 11/13/2021 No Yes I48.0 Paroxysmal atrial fibrillation 11/13/2021 No Yes I10 Essential (primary) hypertension 11/13/2021 No Yes I50.42 Chronic combined systolic (congestive) and diastolic (congestive) heart 11/13/2021 No Yes  failure Inactive Problems Resolved Problems Electronic Signature(s) Signed: 12/18/2021 10:46:09 AM By: Worthy Keeler PA-C Entered By: Worthy Keeler on 12/18/2021 10:46:09 [name] [name] ([id number]) -------------------------------------------------------------------------------- Progress Note Details Patient Name: [name] [name] Date of Service: 12/18/2021 11:00 AM Medical Record Number: [id number] Patient Account Number: [id number] Date of Birth/Sex: 1935-03-27 (86 y.o. F) Treating RN: Carlene Coria Primary Care Provider: Georgian Co Other Clinician: Referring Provider: Georgian Co Treating Provider/Extender: Skipper Cliche in Treatment: 5 Subjective Chief Complaint Information obtained from Patient Left leg and left ankle ulcers History of Present Illness (HPI) 11/13/2021 upon evaluation today patient presents for initial inspection here in our clinic concerning issues that she has been having with a wound over her left leg at both the leg and ankle location currently. This began somewhere around 2-3 maybe even 4 weeks ago based on what she is telling us today. Nonetheless I do believe that she has a history of chronic venous insufficiency, she also has atrial fibrillation, hypertension, and congestive heart failure. Her leg has been swelling quite a bit here. We noted ABIs to be noncompressible but she does seem to have a pretty good pulse noted on Doppler as well as by palpation. She tells me that the wound on her shin occurred as a result of accidentally striking her self with a bag of groceries it was not even that hard. The one on the ankle which she attributes to  shoes that her sister got her that she feels like "somebody wore before she got on and cause this issue". 11/19/2021 upon evaluation today patient appears to be doing well with regard to her wound. I am actually very pleased with where things stand today. I do not see any signs of active infection at this time which is great news and overall I think that she is making good progress here. Her blood pressure is still up again today however. She tells me she was very nervous driving and she is not used to this area and was afraid of there being fog nonetheless I still think this is something that her primary keeps a close track on they see her every 4 weeks especially with her previous heart attack for which she is still on blood thinners as well. 1/16; the patient has 2 wounds on the left leg 1 in the lower mid tibia and one on the lateral lower leg just above the lateral malleolus.. We have been using Hydrofera Blue. Both areas are measuring smaller. The patient has significant venous hypertension perhaps solar skin damageas well 12/03/2021 upon evaluation today patient's wound is actually showing signs of good improvement which is great news. Fortunately I do not see any evidence of active infection at this time which is great news as well. No fevers, chills, nausea, vomiting, or diarrhea. 12/11/2021 upon evaluation today patient unfortunately had a fall since I last saw her she has an extremely swollen black eye on the left. She did go to the ER fortunately with all the scans and everything checked out she did not seem to have any internal damage but externally definitely shows a lot of bruising. She also had to sutures placed in the scalp she is seeing her primary care provider tomorrow they should be taken that out at that point. Subsequently with regard to her eye I explained her this is probably to get worse before it gets better and overall I do not see anything that looks infected most what we see  is just blood that is  collected in and around the eye socket and subsequently this is probably can start spreading down her face as this progresses. 12/18/2021 upon evaluation today patient appears to be completely healed which is great news. Fortunately I do not see any signs of active infection at this time. Overall I am extremely happy with where we stand. Objective Constitutional Well-nourished and well-hydrated in no acute distress. Vitals Time Taken: 11:07 AM, Height: 67 in, Weight: 112 lbs, BMI: 17.5, Temperature: 98.2 F, Pulse: 68 bpm, Respiratory Rate: 18 breaths/min, Blood Pressure: 159/82 mmHg. Respiratory normal breathing without difficulty. Psychiatric this patient is able to make decisions and demonstrates good insight into disease process. Alert and Oriented x 3. pleasant and cooperative. General Notes: Patient showed complete epithelization and does not have anything open at this time which is also him. I do believe that she is ready for discharge she does have compression socks at home. Integumentary (Hair, Skin) Wound #1 status is Open. Original cause of wound was Trauma. The date acquired was: 10/11/2021. The wound has been in treatment 5 weeks. EMARI, HREHA ([id number]) The wound is located on the Left,Anterior Lower Leg. The wound measures 0cm length x 0cm width x 0cm depth; 0cm^2 area and 0cm^3 volume. There is no tunneling or undermining noted. There is a none present amount of drainage noted. There is no granulation within the wound bed. There is no necrotic tissue within the wound bed. 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 Put garments on first thing when you wake up and remove them before bed. Moisturize legs daily after removing compression garments. Elevate, Exercise Daily and Avoid Standing for Long Periods of Time. 1. I would recommend that we going continue with the wound care measures as before and the patient is in agreement with the plan. This includes the use of the compression socks which I do believe will do well to keep things under control here. 2. I am also can recommend that we have the patient continue with the elevation of her legs as well which I think should do quite nicely for her. We will see her back for follow-up visit as needed and I told her in the future if she has any issues she can contact us for a appointment directly she does not have to have a referral first. Electronic Signature(s) Signed: 12/18/2021 11:36:29 AM By: Worthy Keeler PA-C Entered By: Worthy Keeler on 12/18/2021 11:36:29 [name] [name] ([id number]) -------------------------------------------------------------------------------- SuperBill Details Patient Name: [name] [name] Date of Service: 12/18/2021 Medical Record Number: [id number] Patient Account Number: [id number] Date of Birth/Sex: [birth year] (86 y.o. F) Treating RN: Carlene Coria Primary Care Provider: Georgian Co Other Clinician: Referring Provider: Georgian Co Treating Provider/Extender: Skipper Cliche in Treatment: 5 Diagnosis Coding ICD-10 Codes Code Description I87.2 Venous insufficiency (chronic) (peripheral) L97.822 Non-pressure chronic ulcer of other part of left lower leg with fat layer exposed L97.322 Non-pressure chronic ulcer of left ankle with fat layer exposed I48.0 Paroxysmal atrial fibrillation I10 Essential (primary) hypertension I50.42 Chronic combined systolic (congestive) and diastolic (congestive) heart failure Facility  Procedures CPT4 Code: 44920100 Description: 71219 - WOUND CARE VISIT-LEV 2 EST PT Modifier: Quantity: 1 Physician Procedures CPT4  Code: [id number] Description: 43142 - WC PHYS LEVEL 3 - EST PT Modifier: Quantity: 1 CPT4 Code: Description: ICD-10 Diagnosis Description I87.2 Venous insufficiency (chronic) (peripheral) L97.822 Non-pressure chronic ulcer of other part of left lower leg with fat lay L97.322 Non-pressure chronic ulcer of left ankle with fat layer exposed I48.0  Paroxysmal atrial fibrillation Modifier: er exposed Quantity: Electronic Signature(s) Signed: 12/18/2021 11:36:50 AM By: Worthy Keeler PA-C Entered By: Worthy Keeler on 12/18/2021 11:36:50

## 2021-12-18 NOTE — Progress Notes (Addendum)
BREUNNA, NORDMANN ([id number]) Visit Report for 12/18/2021 Arrival Information Details Patient Name: [name], [name] Date of Service: 12/18/2021 11:00 AM Medical Record Number: [id number] Patient Account Number: [id number] Date of Birth/Sex: [birth year] (87 y.o. F) Treating RN: Carlene Coria Primary Care Deke Tilghman: [place] Other Clinician: Referring Lou Loewe: [place] Treating Brigette Hopfer/Extender: Skipper Cliche in Treatment: 5 Visit Information History Since Last Visit All ordered tests and consults were completed: No Patient Arrived: Ambulatory Added or deleted any medications: No Arrival Time: 11:02 Any new allergies or adverse reactions: No Accompanied By: self Had a fall or experienced change in No Transfer Assistance: None activities of daily living that may affect Patient Identification Verified: Yes risk of falls: Secondary Verification Process Completed: Yes Signs or symptoms of abuse/neglect since last visito No Patient Requires Transmission-Based No Hospitalized since last visit: No Precautions: Implantable device outside of the clinic excluding No Patient Has Alerts: Yes cellular tissue based products placed in the center Patient Alerts: Patient on Blood since last visit: Thinner Has Dressing in Place as Prescribed: Yes NON compressable Has Compression in Place as Prescribed: Yes Pain Present Now: No Electronic Signature(s) Signed: 12/20/2021 8:26:07 AM By: Carlene Coria RN Entered By: Carlene Coria on 12/18/2021 11:07:16 [name] [name] ([id number]) -------------------------------------------------------------------------------- Clinic Level of Care Assessment Details Patient Name: [name] [name] Date of Service: 12/18/2021 11:00 AM Medical Record Number: [id number] Patient Account Number: [id number] Date of Birth/Sex: 10-03-1935 (86 y.o. F) Treating RN: Carlene Coria Primary Care Danielle Lento: [place] [employer] Other Clinician: Referring Zulma Court: [place] Treating Roniya Tetro/Extender: Skipper Cliche in Treatment: 5 Clinic Level of Care Assessment Items TOOL 4 Quantity Score X - Use when only an EandM is performed on FOLLOW-UP visit 1 0 ASSESSMENTS - Nursing Assessment / Reassessment X - Reassessment of Co-morbidities (includes updates in patient status) 1 10 X- 1 5 Reassessment of Adherence to Treatment Plan ASSESSMENTS - Wound and Skin Assessment / Reassessment X - Simple Wound Assessment / Reassessment - one wound 1 5 []  - 0 Complex Wound Assessment / Reassessment - multiple wounds []  - 0 Dermatologic / Skin Assessment (not related to wound area) ASSESSMENTS - Focused Assessment []  - Circumferential Edema Measurements - multi extremities 0 []  - 0 Nutritional Assessment / Counseling / Intervention []  - 0 Lower Extremity Assessment (monofilament, tuning fork, pulses) []  - 0 Peripheral Arterial Disease Assessment (using hand held doppler) ASSESSMENTS - Ostomy and/or Continence Assessment and Care []  - Incontinence Assessment and Management 0 []  - 0 Ostomy Care Assessment and Management (repouching, etc.) PROCESS - Coordination of Care X - Simple Patient / Family Education for ongoing care 1 15 []  - 0 Complex (extensive) Patient / Family Education for ongoing care []  - 0 Staff obtains Programmer, systems, Records, Test Results / Process Orders []  - 0 Staff telephones HHA, Nursing Homes / Clarify orders / etc []  - 0 Routine Transfer to another Facility (non-emergent condition) []  - 0 Routine Hospital Admission (non-emergent condition) []  - 0 New Admissions / Biomedical engineer / Ordering NPWT, Apligraf, etc. []  - 0 Emergency Hospital Admission (emergent condition) X- 1 10 Simple Discharge Coordination []  - 0 Complex (extensive) Discharge Coordination PROCESS - Special Needs []  - Pediatric / Minor Patient Management 0 []  - 0 Isolation Patient Management []  - 0 Hearing / Language / Visual special needs []  -  0 Assessment of Community assistance (transportation, D/C planning, etc.) []  - 0 Additional assistance / Altered mentation []  - 0 Support Surface(s) Assessment (bed, cushion, seat, etc.) INTERVENTIONS - Wound Cleansing / Measurement  Fenster, Jodean ([id number]) X- 1 5 Simple Wound Cleansing - one wound []  - 0 Complex Wound Cleansing - multiple wounds X- 1 5 Wound Imaging (photographs - any number of wounds) []  - 0 Wound Tracing (instead of photographs) X- 1 5 Simple Wound Measurement - one wound []  - 0 Complex Wound Measurement - multiple wounds INTERVENTIONS - Wound Dressings []  - Small Wound Dressing one or multiple wounds 0 []  - 0 Medium Wound Dressing one or multiple wounds []  - 0 Large Wound Dressing one or multiple wounds []  - 0 Application of Medications - topical []  - 0 Application of Medications - injection INTERVENTIONS - Miscellaneous []  - External ear exam 0 []  - 0 Specimen Collection (cultures, biopsies, blood, body fluids, etc.) []  - 0 Specimen(s) / Culture(s) sent or taken to Lab for analysis []  - 0 Patient Transfer (multiple staff / Civil Service fast streamer / Similar devices) []  - 0 Simple Staple / Suture removal (25 or less) []  - 0 Complex Staple / Suture removal (26 or more) []  - 0 Hypo / Hyperglycemic Management (close monitor of Blood Glucose) []  - 0 Ankle / Brachial Index (ABI) - do not check if billed separately X- 1 5 Vital Signs Has the patient been seen at the hospital within the last three years: Yes Total Score: 65 Level Of Care: New/Established - Level 2 Electronic Signature(s) Signed: 12/20/2021 8:26:07 AM By: Carlene Coria RN Entered By: Carlene Coria on 12/18/2021 11:31:07 [name] [name] ([id number]) -------------------------------------------------------------------------------- Encounter Discharge Information Details Patient Name: [name] [name] Date of Service: 12/18/2021 11:00 AM Medical Record Number: [id number] Patient Account Number:  [id number] Date of Birth/Sex: [birth year] (86 y.o. F) Treating RN: Carlene Coria Primary Care Markez Dowland: [place] Other Clinician: Referring Charlye Spare: [place] Treating Johnasia Liese/Extender: Skipper Cliche in Treatment: 5 Encounter Discharge Information Items Discharge Condition: Stable Ambulatory Status: Walker Discharge Destination: Home Transportation: Private Auto Accompanied By: self Schedule Follow-up Appointment: Yes Clinical Summary of Care: Patient Declined Electronic Signature(s) Signed: 12/20/2021 8:26:07 AM By: Carlene Coria RN Entered By: Carlene Coria on 12/18/2021 12:36:37 [name] [name] ([id number]) -------------------------------------------------------------------------------- Lower Extremity Assessment Details Patient Name: [name] [name] Date of Service: 12/18/2021 11:00 AM Medical Record Number: [id number] Patient Account Number: [id number] Date of Birth/Sex: 05-19-1935 (86 y.o. F) Treating RN: Carlene Coria Primary Care Alessander Sikorski: [place] Other Clinician: Referring Cordale Manera: [place] [employer] Treating Twan Harkin/Extender: Jeri Cos Weeks in Treatment: 5 Edema Assessment Assessed: [Left: No] [Right: No] Edema: [Left: N] [Right: o] Calf Left: Right: Point of Measurement: 32 cm From Medial Instep 29 cm Ankle Left: Right: Point of Measurement: 10 cm From Medial Instep 18 cm Vascular Assessment Pulses: Dorsalis Pedis Palpable: [Left:Yes] Electronic Signature(s) Signed: 12/20/2021 8:26:07 AM By: Carlene Coria RN Entered By: Carlene Coria on 12/18/2021 11:12:04 [name] [name] ([id number]) -------------------------------------------------------------------------------- Multi Wound Chart Details Patient Name: [name] [name] Date of Service: 12/18/2021 11:00 AM Medical Record Number: [id number] Patient Account Number: [id number] Date of Birth/Sex: [birth year] (86 y.o. F) Treating RN: Carlene Coria Primary Care Camryn Lampson: [place] [employer] Other  Clinician: Referring Veleka Djordjevic: [place] [employer] Treating Shanen Norris/Extender: Skipper Cliche in Treatment: 5 Vital Signs Height(in): 67 Pulse(bpm): 71 Weight(lbs): 112 Blood Pressure(mmHg): 159/82 Body Mass Index(BMI): 17.5 Temperature(F): 98.2 Respiratory Rate(breaths/min): 18 Photos: [N/A:N/A] Wound Location: Left, Anterior Lower Leg N/A N/A Wounding Event: Trauma N/A N/A Primary Etiology: Venous Leg Ulcer N/A N/A Comorbid History: Congestive Heart Failure, N/A N/A Hypertension Date Acquired: 10/11/2021 N/A N/A Weeks of Treatment: 5 N/A N/A Wound Status: Open N/A N/A Wound Recurrence: No N/A N/A Measurements L  x W x D (cm) 0x0x0 N/A N/A Area (cm) : 0 N/A N/A Volume (cm) : 0 N/A N/A % Reduction in Area: 100.00% N/A N/A % Reduction in Volume: 100.00% N/A N/A Classification: Full Thickness Without Exposed N/A N/A Support Structures Exudate Amount: None Present N/A N/A Granulation Amount: None Present (0%) N/A N/A Necrotic Amount: None Present (0%) N/A N/A Exposed Structures: Fascia: No N/A N/A Fat Layer (Subcutaneous Tissue): No Tendon: No Muscle: No Joint: No Bone: No Epithelialization: Large (67-100%) N/A N/A Treatment Notes Electronic Signature(s) Signed: 12/20/2021 8:26:07 AM By: Carlene Coria RN Entered By: Carlene Coria on 12/18/2021 11:29:45 [name] [name] ([id number]) -------------------------------------------------------------------------------- Multi-Disciplinary Care Plan Details Patient Name: [name] [name] Date of Service: 12/18/2021 11:00 AM Medical Record Number: [id number] Patient Account Number: [id number] Date of Birth/Sex: [birth year] (86 y.o. F) Treating RN: Carlene Coria Primary Care Avyn Aden: [place] Other Clinician: Referring Deklan Minar: [place] Treating Ryson Bacha/Extender: Jeri Cos Weeks in Treatment: 5 Active Inactive Electronic Signature(s) Signed: 12/20/2021 8:26:07 AM By: Carlene Coria RN Entered By: Carlene Coria on  12/18/2021 11:29:34 [name] [name] ([id number]) -------------------------------------------------------------------------------- Pain Assessment Details Patient Name: [name] [name] Date of Service: 12/18/2021 11:00 AM Medical Record Number: [id number] Patient Account Number: [id number] Date of Birth/Sex: 1934-12-30 (86 y.o. F) Treating RN: Carlene Coria Primary Care Kathrene Sinopoli: [place] Other Clinician: Referring Guy Toney: [place] Treating Ardie Mclennan/Extender: Skipper Cliche in Treatment: 5 Active Problems Location of Pain Severity and Description of Pain Patient Has Paino No Site Locations Pain Management and Medication Current Pain Management: Electronic Signature(s) Signed: 12/20/2021 8:26:07 AM By: Carlene Coria RN Entered By: Carlene Coria on 12/18/2021 11:07:43 [name] [name] ([id number]) -------------------------------------------------------------------------------- Patient/Caregiver Education Details Patient Name: [name] [name] Date of Service: 12/18/2021 11:00 AM Medical Record Number: [id number] Patient Account Number: [id number] Date of Birth/Gender: 1935/07/21 (86 y.o. F) Treating RN: Carlene Coria Primary Care Physician: [place] Other Clinician: Referring Physician: [place] Treating Physician/Extender: Skipper Cliche in Treatment: 5 Education Assessment Education Provided To: Patient Education Topics Provided Wound/Skin Impairment: Methods: Explain/Verbal Responses: State content correctly Electronic Signature(s) Signed: 12/20/2021 8:26:07 AM By: Carlene Coria RN Entered By: Carlene Coria on 12/18/2021 12:35:45 [name] [name] ([id number]) -------------------------------------------------------------------------------- Wound Assessment Details Patient Name: [name] [name] Date of Service: 12/18/2021 11:00 AM Medical Record Number: [id number] Patient Account Number: [id number] Date of Birth/Sex: 09-Aug-1935 (86 y.o. F) Treating RN: Carlene Coria Primary Care Maeleigh Buschman: [place] Other Clinician: Referring Tsugio Elison: [place] Treating Marrio Scribner/Extender: Jeri Cos Weeks in Treatment: 5 Wound Status Wound Number: 1 Primary Etiology: Venous Leg Ulcer Wound Location: Left, Anterior Lower Leg Wound Status: Open Wounding Event: Trauma Comorbid History: Congestive Heart Failure, Hypertension Date Acquired: 10/11/2021 Weeks Of Treatment: 5 Clustered Wound: No Photos Wound Measurements Length: (cm) 0 Width: (cm) 0 Depth: (cm) 0 Area: (cm) 0 Volume: (cm) 0 % Reduction in Area: 100% % Reduction in Volume: 100% Epithelialization: Large (67-100%) Tunneling: No Undermining: No Wound Description Classification: Full Thickness Without Exposed Support Structures Exudate Amount: None Present Foul Odor After Cleansing: No Slough/Fibrino No Wound Bed Granulation Amount: None Present (0%) Exposed Structure Necrotic Amount: None Present (0%) Fascia Exposed: No Fat Layer (Subcutaneous Tissue) Exposed: No Tendon Exposed: No Muscle Exposed: No Joint Exposed: No Bone Exposed: No Electronic Signature(s) Signed: 12/20/2021 8:26:07 AM By: Carlene Coria RN Entered By: Carlene Coria on 12/18/2021 11:11:37 [name] [name] ([id number]) -------------------------------------------------------------------------------- [place] Details Patient Name: [name] [name] Date of Service: 12/18/2021 11:00 AM Medical Record Number: [id number] Patient Account Number: [id number] Date of Birth/Sex: September 09, 1935 (86 y.o. F) Treating RN:  Carlene Coria Primary Care Lateya Dauria: [place] Other Clinician: Referring Obie Silos: [place] Treating Rosealie Reach/Extender: Skipper Cliche in Treatment: 5 Vital Signs Time Taken: 11:07 Temperature (F): 98.2 Height (in): 67 Pulse (bpm): 68 Weight (lbs): 112 Respiratory Rate (breaths/min): 18 Body Mass Index (BMI): 17.5 Blood Pressure (mmHg): 159/82 Reference Range: 80 - 120 mg /  dl Electronic Signature(s) Signed: 12/20/2021 8:26:07 AM By: Carlene Coria RN Entered By: Carlene Coria on 12/18/2021 11:07:35

## 2021-12-19 DIAGNOSIS — L299 Pruritus, unspecified: Secondary | ICD-10-CM | POA: Diagnosis not present

## 2021-12-19 DIAGNOSIS — J449 Chronic obstructive pulmonary disease, unspecified: Secondary | ICD-10-CM | POA: Diagnosis not present

## 2021-12-19 DIAGNOSIS — I10 Essential (primary) hypertension: Secondary | ICD-10-CM | POA: Diagnosis not present

## 2021-12-19 DIAGNOSIS — E782 Mixed hyperlipidemia: Secondary | ICD-10-CM | POA: Diagnosis not present

## 2021-12-25 ENCOUNTER — Encounter: Payer: Medicare Other | Admitting: Physician Assistant

## 2021-12-31 ENCOUNTER — Other Ambulatory Visit: Payer: Self-pay | Admitting: Family

## 2021-12-31 DIAGNOSIS — I10 Essential (primary) hypertension: Secondary | ICD-10-CM

## 2021-12-31 DIAGNOSIS — I25118 Atherosclerotic heart disease of native coronary artery with other forms of angina pectoris: Secondary | ICD-10-CM

## 2021-12-31 NOTE — Telephone Encounter (Signed)
Rx(s) sent to pharmacy electronically.  

## 2022-01-08 DIAGNOSIS — E782 Mixed hyperlipidemia: Secondary | ICD-10-CM | POA: Diagnosis not present

## 2022-01-08 DIAGNOSIS — I10 Essential (primary) hypertension: Secondary | ICD-10-CM | POA: Diagnosis not present

## 2022-01-08 DIAGNOSIS — J449 Chronic obstructive pulmonary disease, unspecified: Secondary | ICD-10-CM | POA: Diagnosis not present

## 2022-01-16 DIAGNOSIS — J449 Chronic obstructive pulmonary disease, unspecified: Secondary | ICD-10-CM | POA: Diagnosis not present

## 2022-01-16 DIAGNOSIS — I10 Essential (primary) hypertension: Secondary | ICD-10-CM | POA: Diagnosis not present

## 2022-01-16 DIAGNOSIS — I509 Heart failure, unspecified: Secondary | ICD-10-CM | POA: Diagnosis not present

## 2022-01-16 DIAGNOSIS — G990 Autonomic neuropathy in diseases classified elsewhere: Secondary | ICD-10-CM | POA: Diagnosis not present

## 2022-01-16 DIAGNOSIS — E782 Mixed hyperlipidemia: Secondary | ICD-10-CM | POA: Diagnosis not present

## 2022-01-16 DIAGNOSIS — L299 Pruritus, unspecified: Secondary | ICD-10-CM | POA: Diagnosis not present

## 2022-01-24 ENCOUNTER — Other Ambulatory Visit: Payer: Self-pay | Admitting: Family

## 2022-01-24 NOTE — Telephone Encounter (Signed)
Rx(s) sent to pharmacy electronically.  

## 2022-02-20 DIAGNOSIS — G8929 Other chronic pain: Secondary | ICD-10-CM | POA: Diagnosis not present

## 2022-02-20 DIAGNOSIS — I10 Essential (primary) hypertension: Secondary | ICD-10-CM | POA: Diagnosis not present

## 2022-02-20 DIAGNOSIS — J449 Chronic obstructive pulmonary disease, unspecified: Secondary | ICD-10-CM | POA: Diagnosis not present

## 2022-02-20 DIAGNOSIS — E782 Mixed hyperlipidemia: Secondary | ICD-10-CM | POA: Diagnosis not present

## 2022-02-20 DIAGNOSIS — L299 Pruritus, unspecified: Secondary | ICD-10-CM | POA: Diagnosis not present

## 2022-03-04 ENCOUNTER — Telehealth: Payer: Self-pay | Admitting: Cardiovascular Disease

## 2022-03-04 NOTE — Telephone Encounter (Signed)
Called to schedule overdue appt, patient said she would c/b  ?

## 2022-03-24 ENCOUNTER — Other Ambulatory Visit: Payer: Self-pay | Admitting: Cardiovascular Disease

## 2022-03-24 DIAGNOSIS — I25118 Atherosclerotic heart disease of native coronary artery with other forms of angina pectoris: Secondary | ICD-10-CM

## 2022-03-24 DIAGNOSIS — I10 Essential (primary) hypertension: Secondary | ICD-10-CM

## 2022-04-10 DIAGNOSIS — L299 Pruritus, unspecified: Secondary | ICD-10-CM | POA: Diagnosis not present

## 2022-04-10 DIAGNOSIS — Z9861 Coronary angioplasty status: Secondary | ICD-10-CM | POA: Diagnosis not present

## 2022-04-10 DIAGNOSIS — E782 Mixed hyperlipidemia: Secondary | ICD-10-CM | POA: Diagnosis not present

## 2022-04-10 DIAGNOSIS — I10 Essential (primary) hypertension: Secondary | ICD-10-CM | POA: Diagnosis not present

## 2022-04-10 DIAGNOSIS — E559 Vitamin D deficiency, unspecified: Secondary | ICD-10-CM | POA: Diagnosis not present

## 2022-04-10 DIAGNOSIS — J449 Chronic obstructive pulmonary disease, unspecified: Secondary | ICD-10-CM | POA: Diagnosis not present

## 2022-04-19 ENCOUNTER — Other Ambulatory Visit: Payer: Self-pay | Admitting: Cardiovascular Disease

## 2022-04-19 DIAGNOSIS — I10 Essential (primary) hypertension: Secondary | ICD-10-CM

## 2022-04-19 DIAGNOSIS — I25118 Atherosclerotic heart disease of native coronary artery with other forms of angina pectoris: Secondary | ICD-10-CM

## 2022-04-19 NOTE — Telephone Encounter (Signed)
Rx(s) sent to pharmacy electronically.  

## 2022-05-09 ENCOUNTER — Other Ambulatory Visit: Payer: Self-pay | Admitting: Family

## 2022-05-09 ENCOUNTER — Ambulatory Visit
Admission: RE | Admit: 2022-05-09 | Discharge: 2022-05-09 | Disposition: A | Discharge status: Home / Self Care | Payer: Medicare Other | Source: Ambulatory Visit | Attending: Family | Admitting: Family

## 2022-05-09 DIAGNOSIS — L03116 Cellulitis of left lower limb: Secondary | ICD-10-CM | POA: Diagnosis not present

## 2022-05-09 DIAGNOSIS — M7989 Other specified soft tissue disorders: Secondary | ICD-10-CM | POA: Insufficient documentation

## 2022-05-09 DIAGNOSIS — M79662 Pain in left lower leg: Secondary | ICD-10-CM | POA: Diagnosis not present

## 2022-05-09 DIAGNOSIS — I824Z2 Acute embolism and thrombosis of unspecified deep veins of left distal lower extremity: Secondary | ICD-10-CM | POA: Diagnosis not present

## 2022-05-09 DIAGNOSIS — M7122 Synovial cyst of popliteal space [Baker], left knee: Secondary | ICD-10-CM | POA: Diagnosis not present

## 2022-05-09 DIAGNOSIS — I82402 Acute embolism and thrombosis of unspecified deep veins of left lower extremity: Secondary | ICD-10-CM

## 2022-05-09 DIAGNOSIS — I10 Essential (primary) hypertension: Secondary | ICD-10-CM | POA: Diagnosis not present

## 2022-05-09 DIAGNOSIS — E782 Mixed hyperlipidemia: Secondary | ICD-10-CM | POA: Diagnosis not present

## 2022-05-09 DIAGNOSIS — J449 Chronic obstructive pulmonary disease, unspecified: Secondary | ICD-10-CM | POA: Diagnosis not present

## 2022-05-10 DIAGNOSIS — I10 Essential (primary) hypertension: Secondary | ICD-10-CM | POA: Diagnosis not present

## 2022-05-10 DIAGNOSIS — E782 Mixed hyperlipidemia: Secondary | ICD-10-CM | POA: Diagnosis not present

## 2022-05-10 DIAGNOSIS — J449 Chronic obstructive pulmonary disease, unspecified: Secondary | ICD-10-CM | POA: Diagnosis not present

## 2022-05-16 ENCOUNTER — Other Ambulatory Visit: Payer: Self-pay | Admitting: Cardiovascular Disease

## 2022-05-16 DIAGNOSIS — I10 Essential (primary) hypertension: Secondary | ICD-10-CM

## 2022-05-16 DIAGNOSIS — I25118 Atherosclerotic heart disease of native coronary artery with other forms of angina pectoris: Secondary | ICD-10-CM

## 2022-06-01 ENCOUNTER — Other Ambulatory Visit: Payer: Self-pay | Admitting: Cardiovascular Disease

## 2022-06-01 DIAGNOSIS — I25118 Atherosclerotic heart disease of native coronary artery with other forms of angina pectoris: Secondary | ICD-10-CM

## 2022-06-01 DIAGNOSIS — I10 Essential (primary) hypertension: Secondary | ICD-10-CM

## 2022-06-04 NOTE — Telephone Encounter (Signed)
Rx(s) sent to pharmacy electronically.  

## 2022-06-18 ENCOUNTER — Other Ambulatory Visit: Payer: Self-pay | Admitting: Cardiovascular Disease

## 2022-06-18 DIAGNOSIS — I10 Essential (primary) hypertension: Secondary | ICD-10-CM

## 2022-06-18 DIAGNOSIS — I25118 Atherosclerotic heart disease of native coronary artery with other forms of angina pectoris: Secondary | ICD-10-CM

## 2022-06-18 NOTE — Telephone Encounter (Signed)
Called patient to schedule follow up appointment, she states she does not see our group anymore for her cardiac needs.

## 2022-06-18 NOTE — Telephone Encounter (Signed)
Please call pt to schedule overdue appointment with Dr. Fletcher Anon for refills. Thank you!

## 2022-06-26 DIAGNOSIS — I10 Essential (primary) hypertension: Secondary | ICD-10-CM | POA: Diagnosis not present

## 2022-06-26 DIAGNOSIS — G990 Autonomic neuropathy in diseases classified elsewhere: Secondary | ICD-10-CM | POA: Diagnosis not present

## 2022-06-26 DIAGNOSIS — G8929 Other chronic pain: Secondary | ICD-10-CM | POA: Diagnosis not present

## 2022-06-26 DIAGNOSIS — I2510 Atherosclerotic heart disease of native coronary artery without angina pectoris: Secondary | ICD-10-CM | POA: Diagnosis not present

## 2022-06-26 DIAGNOSIS — J449 Chronic obstructive pulmonary disease, unspecified: Secondary | ICD-10-CM | POA: Diagnosis not present

## 2022-06-26 DIAGNOSIS — M419 Scoliosis, unspecified: Secondary | ICD-10-CM | POA: Diagnosis not present

## 2022-06-26 DIAGNOSIS — E782 Mixed hyperlipidemia: Secondary | ICD-10-CM | POA: Diagnosis not present

## 2022-06-26 DIAGNOSIS — M549 Dorsalgia, unspecified: Secondary | ICD-10-CM | POA: Diagnosis not present

## 2022-06-26 DIAGNOSIS — L03116 Cellulitis of left lower limb: Secondary | ICD-10-CM | POA: Diagnosis not present

## 2022-07-01 DIAGNOSIS — E871 Hypo-osmolality and hyponatremia: Secondary | ICD-10-CM | POA: Diagnosis not present

## 2022-07-01 DIAGNOSIS — E559 Vitamin D deficiency, unspecified: Secondary | ICD-10-CM | POA: Diagnosis not present

## 2022-07-01 DIAGNOSIS — J449 Chronic obstructive pulmonary disease, unspecified: Secondary | ICD-10-CM | POA: Diagnosis not present

## 2022-07-01 DIAGNOSIS — L03116 Cellulitis of left lower limb: Secondary | ICD-10-CM | POA: Diagnosis not present

## 2022-07-01 DIAGNOSIS — G8929 Other chronic pain: Secondary | ICD-10-CM | POA: Diagnosis not present

## 2022-07-01 DIAGNOSIS — I509 Heart failure, unspecified: Secondary | ICD-10-CM | POA: Diagnosis not present

## 2022-07-01 DIAGNOSIS — M150 Primary generalized (osteo)arthritis: Secondary | ICD-10-CM | POA: Diagnosis not present

## 2022-07-01 DIAGNOSIS — I110 Hypertensive heart disease with heart failure: Secondary | ICD-10-CM | POA: Diagnosis not present

## 2022-07-01 DIAGNOSIS — M25552 Pain in left hip: Secondary | ICD-10-CM | POA: Diagnosis not present

## 2022-07-01 DIAGNOSIS — M419 Scoliosis, unspecified: Secondary | ICD-10-CM | POA: Diagnosis not present

## 2022-07-01 DIAGNOSIS — I252 Old myocardial infarction: Secondary | ICD-10-CM | POA: Diagnosis not present

## 2022-07-01 DIAGNOSIS — I2510 Atherosclerotic heart disease of native coronary artery without angina pectoris: Secondary | ICD-10-CM | POA: Diagnosis not present

## 2022-07-01 DIAGNOSIS — M25551 Pain in right hip: Secondary | ICD-10-CM | POA: Diagnosis not present

## 2022-07-01 DIAGNOSIS — M810 Age-related osteoporosis without current pathological fracture: Secondary | ICD-10-CM | POA: Diagnosis not present

## 2022-07-01 DIAGNOSIS — Z96649 Presence of unspecified artificial hip joint: Secondary | ICD-10-CM | POA: Diagnosis not present

## 2022-07-01 DIAGNOSIS — M549 Dorsalgia, unspecified: Secondary | ICD-10-CM | POA: Diagnosis not present

## 2022-07-01 DIAGNOSIS — Z85828 Personal history of other malignant neoplasm of skin: Secondary | ICD-10-CM | POA: Diagnosis not present

## 2022-07-01 DIAGNOSIS — I739 Peripheral vascular disease, unspecified: Secondary | ICD-10-CM | POA: Diagnosis not present

## 2022-07-01 DIAGNOSIS — E782 Mixed hyperlipidemia: Secondary | ICD-10-CM | POA: Diagnosis not present

## 2022-07-03 DIAGNOSIS — I739 Peripheral vascular disease, unspecified: Secondary | ICD-10-CM | POA: Diagnosis not present

## 2022-07-03 DIAGNOSIS — E871 Hypo-osmolality and hyponatremia: Secondary | ICD-10-CM | POA: Diagnosis not present

## 2022-07-03 DIAGNOSIS — E559 Vitamin D deficiency, unspecified: Secondary | ICD-10-CM | POA: Diagnosis not present

## 2022-07-03 DIAGNOSIS — M810 Age-related osteoporosis without current pathological fracture: Secondary | ICD-10-CM | POA: Diagnosis not present

## 2022-07-03 DIAGNOSIS — M25552 Pain in left hip: Secondary | ICD-10-CM | POA: Diagnosis not present

## 2022-07-03 DIAGNOSIS — G8929 Other chronic pain: Secondary | ICD-10-CM | POA: Diagnosis not present

## 2022-07-03 DIAGNOSIS — I110 Hypertensive heart disease with heart failure: Secondary | ICD-10-CM | POA: Diagnosis not present

## 2022-07-03 DIAGNOSIS — Z85828 Personal history of other malignant neoplasm of skin: Secondary | ICD-10-CM | POA: Diagnosis not present

## 2022-07-03 DIAGNOSIS — I252 Old myocardial infarction: Secondary | ICD-10-CM | POA: Diagnosis not present

## 2022-07-03 DIAGNOSIS — M150 Primary generalized (osteo)arthritis: Secondary | ICD-10-CM | POA: Diagnosis not present

## 2022-07-03 DIAGNOSIS — M25551 Pain in right hip: Secondary | ICD-10-CM | POA: Diagnosis not present

## 2022-07-03 DIAGNOSIS — I509 Heart failure, unspecified: Secondary | ICD-10-CM | POA: Diagnosis not present

## 2022-07-03 DIAGNOSIS — J449 Chronic obstructive pulmonary disease, unspecified: Secondary | ICD-10-CM | POA: Diagnosis not present

## 2022-07-03 DIAGNOSIS — M549 Dorsalgia, unspecified: Secondary | ICD-10-CM | POA: Diagnosis not present

## 2022-07-03 DIAGNOSIS — L03116 Cellulitis of left lower limb: Secondary | ICD-10-CM | POA: Diagnosis not present

## 2022-07-03 DIAGNOSIS — Z96649 Presence of unspecified artificial hip joint: Secondary | ICD-10-CM | POA: Diagnosis not present

## 2022-07-03 DIAGNOSIS — I2510 Atherosclerotic heart disease of native coronary artery without angina pectoris: Secondary | ICD-10-CM | POA: Diagnosis not present

## 2022-07-03 DIAGNOSIS — M419 Scoliosis, unspecified: Secondary | ICD-10-CM | POA: Diagnosis not present

## 2022-07-03 DIAGNOSIS — E782 Mixed hyperlipidemia: Secondary | ICD-10-CM | POA: Diagnosis not present

## 2022-07-05 ENCOUNTER — Other Ambulatory Visit: Payer: Self-pay | Admitting: Cardiovascular Disease

## 2022-07-05 DIAGNOSIS — Z96649 Presence of unspecified artificial hip joint: Secondary | ICD-10-CM | POA: Diagnosis not present

## 2022-07-05 DIAGNOSIS — I2510 Atherosclerotic heart disease of native coronary artery without angina pectoris: Secondary | ICD-10-CM | POA: Diagnosis not present

## 2022-07-05 DIAGNOSIS — L03116 Cellulitis of left lower limb: Secondary | ICD-10-CM | POA: Diagnosis not present

## 2022-07-05 DIAGNOSIS — E782 Mixed hyperlipidemia: Secondary | ICD-10-CM | POA: Diagnosis not present

## 2022-07-05 DIAGNOSIS — E559 Vitamin D deficiency, unspecified: Secondary | ICD-10-CM | POA: Diagnosis not present

## 2022-07-05 DIAGNOSIS — E871 Hypo-osmolality and hyponatremia: Secondary | ICD-10-CM | POA: Diagnosis not present

## 2022-07-05 DIAGNOSIS — M810 Age-related osteoporosis without current pathological fracture: Secondary | ICD-10-CM | POA: Diagnosis not present

## 2022-07-05 DIAGNOSIS — I110 Hypertensive heart disease with heart failure: Secondary | ICD-10-CM | POA: Diagnosis not present

## 2022-07-05 DIAGNOSIS — M150 Primary generalized (osteo)arthritis: Secondary | ICD-10-CM | POA: Diagnosis not present

## 2022-07-05 DIAGNOSIS — I10 Essential (primary) hypertension: Secondary | ICD-10-CM

## 2022-07-05 DIAGNOSIS — M549 Dorsalgia, unspecified: Secondary | ICD-10-CM | POA: Diagnosis not present

## 2022-07-05 DIAGNOSIS — I509 Heart failure, unspecified: Secondary | ICD-10-CM | POA: Diagnosis not present

## 2022-07-05 DIAGNOSIS — M25552 Pain in left hip: Secondary | ICD-10-CM | POA: Diagnosis not present

## 2022-07-05 DIAGNOSIS — J449 Chronic obstructive pulmonary disease, unspecified: Secondary | ICD-10-CM | POA: Diagnosis not present

## 2022-07-05 DIAGNOSIS — M419 Scoliosis, unspecified: Secondary | ICD-10-CM | POA: Diagnosis not present

## 2022-07-05 DIAGNOSIS — Z85828 Personal history of other malignant neoplasm of skin: Secondary | ICD-10-CM | POA: Diagnosis not present

## 2022-07-05 DIAGNOSIS — G8929 Other chronic pain: Secondary | ICD-10-CM | POA: Diagnosis not present

## 2022-07-05 DIAGNOSIS — I25118 Atherosclerotic heart disease of native coronary artery with other forms of angina pectoris: Secondary | ICD-10-CM

## 2022-07-05 DIAGNOSIS — I739 Peripheral vascular disease, unspecified: Secondary | ICD-10-CM | POA: Diagnosis not present

## 2022-07-05 DIAGNOSIS — I252 Old myocardial infarction: Secondary | ICD-10-CM | POA: Diagnosis not present

## 2022-07-05 DIAGNOSIS — M25551 Pain in right hip: Secondary | ICD-10-CM | POA: Diagnosis not present

## 2022-07-05 NOTE — Telephone Encounter (Signed)
Please call pt to schedule appt with Dr. Fletcher Anon or APP for refills. Thank you!

## 2022-07-05 NOTE — Telephone Encounter (Signed)
Patient states she has changes cardiologists

## 2022-07-08 DIAGNOSIS — I509 Heart failure, unspecified: Secondary | ICD-10-CM | POA: Diagnosis not present

## 2022-07-08 DIAGNOSIS — I739 Peripheral vascular disease, unspecified: Secondary | ICD-10-CM | POA: Diagnosis not present

## 2022-07-08 DIAGNOSIS — J449 Chronic obstructive pulmonary disease, unspecified: Secondary | ICD-10-CM | POA: Diagnosis not present

## 2022-07-08 DIAGNOSIS — E871 Hypo-osmolality and hyponatremia: Secondary | ICD-10-CM | POA: Diagnosis not present

## 2022-07-08 DIAGNOSIS — Z96649 Presence of unspecified artificial hip joint: Secondary | ICD-10-CM | POA: Diagnosis not present

## 2022-07-08 DIAGNOSIS — M25551 Pain in right hip: Secondary | ICD-10-CM | POA: Diagnosis not present

## 2022-07-08 DIAGNOSIS — M25552 Pain in left hip: Secondary | ICD-10-CM | POA: Diagnosis not present

## 2022-07-08 DIAGNOSIS — E559 Vitamin D deficiency, unspecified: Secondary | ICD-10-CM | POA: Diagnosis not present

## 2022-07-08 DIAGNOSIS — I2510 Atherosclerotic heart disease of native coronary artery without angina pectoris: Secondary | ICD-10-CM | POA: Diagnosis not present

## 2022-07-08 DIAGNOSIS — M419 Scoliosis, unspecified: Secondary | ICD-10-CM | POA: Diagnosis not present

## 2022-07-08 DIAGNOSIS — M810 Age-related osteoporosis without current pathological fracture: Secondary | ICD-10-CM | POA: Diagnosis not present

## 2022-07-08 DIAGNOSIS — I110 Hypertensive heart disease with heart failure: Secondary | ICD-10-CM | POA: Diagnosis not present

## 2022-07-08 DIAGNOSIS — M150 Primary generalized (osteo)arthritis: Secondary | ICD-10-CM | POA: Diagnosis not present

## 2022-07-08 DIAGNOSIS — M549 Dorsalgia, unspecified: Secondary | ICD-10-CM | POA: Diagnosis not present

## 2022-07-08 DIAGNOSIS — Z85828 Personal history of other malignant neoplasm of skin: Secondary | ICD-10-CM | POA: Diagnosis not present

## 2022-07-08 DIAGNOSIS — I252 Old myocardial infarction: Secondary | ICD-10-CM | POA: Diagnosis not present

## 2022-07-08 DIAGNOSIS — E782 Mixed hyperlipidemia: Secondary | ICD-10-CM | POA: Diagnosis not present

## 2022-07-08 DIAGNOSIS — L03116 Cellulitis of left lower limb: Secondary | ICD-10-CM | POA: Diagnosis not present

## 2022-07-08 DIAGNOSIS — G8929 Other chronic pain: Secondary | ICD-10-CM | POA: Diagnosis not present

## 2022-07-11 DIAGNOSIS — L03116 Cellulitis of left lower limb: Secondary | ICD-10-CM | POA: Diagnosis not present

## 2022-07-11 DIAGNOSIS — I252 Old myocardial infarction: Secondary | ICD-10-CM | POA: Diagnosis not present

## 2022-07-11 DIAGNOSIS — E782 Mixed hyperlipidemia: Secondary | ICD-10-CM | POA: Diagnosis not present

## 2022-07-11 DIAGNOSIS — M150 Primary generalized (osteo)arthritis: Secondary | ICD-10-CM | POA: Diagnosis not present

## 2022-07-11 DIAGNOSIS — Z96649 Presence of unspecified artificial hip joint: Secondary | ICD-10-CM | POA: Diagnosis not present

## 2022-07-11 DIAGNOSIS — E559 Vitamin D deficiency, unspecified: Secondary | ICD-10-CM | POA: Diagnosis not present

## 2022-07-11 DIAGNOSIS — M25551 Pain in right hip: Secondary | ICD-10-CM | POA: Diagnosis not present

## 2022-07-11 DIAGNOSIS — I110 Hypertensive heart disease with heart failure: Secondary | ICD-10-CM | POA: Diagnosis not present

## 2022-07-11 DIAGNOSIS — G8929 Other chronic pain: Secondary | ICD-10-CM | POA: Diagnosis not present

## 2022-07-11 DIAGNOSIS — M419 Scoliosis, unspecified: Secondary | ICD-10-CM | POA: Diagnosis not present

## 2022-07-11 DIAGNOSIS — Z85828 Personal history of other malignant neoplasm of skin: Secondary | ICD-10-CM | POA: Diagnosis not present

## 2022-07-11 DIAGNOSIS — I739 Peripheral vascular disease, unspecified: Secondary | ICD-10-CM | POA: Diagnosis not present

## 2022-07-11 DIAGNOSIS — M25552 Pain in left hip: Secondary | ICD-10-CM | POA: Diagnosis not present

## 2022-07-11 DIAGNOSIS — M549 Dorsalgia, unspecified: Secondary | ICD-10-CM | POA: Diagnosis not present

## 2022-07-11 DIAGNOSIS — M810 Age-related osteoporosis without current pathological fracture: Secondary | ICD-10-CM | POA: Diagnosis not present

## 2022-07-11 DIAGNOSIS — I2510 Atherosclerotic heart disease of native coronary artery without angina pectoris: Secondary | ICD-10-CM | POA: Diagnosis not present

## 2022-07-11 DIAGNOSIS — E871 Hypo-osmolality and hyponatremia: Secondary | ICD-10-CM | POA: Diagnosis not present

## 2022-07-11 DIAGNOSIS — I509 Heart failure, unspecified: Secondary | ICD-10-CM | POA: Diagnosis not present

## 2022-07-11 DIAGNOSIS — J449 Chronic obstructive pulmonary disease, unspecified: Secondary | ICD-10-CM | POA: Diagnosis not present

## 2022-07-16 DIAGNOSIS — M549 Dorsalgia, unspecified: Secondary | ICD-10-CM | POA: Diagnosis not present

## 2022-07-16 DIAGNOSIS — E559 Vitamin D deficiency, unspecified: Secondary | ICD-10-CM | POA: Diagnosis not present

## 2022-07-16 DIAGNOSIS — I2510 Atherosclerotic heart disease of native coronary artery without angina pectoris: Secondary | ICD-10-CM | POA: Diagnosis not present

## 2022-07-16 DIAGNOSIS — E871 Hypo-osmolality and hyponatremia: Secondary | ICD-10-CM | POA: Diagnosis not present

## 2022-07-16 DIAGNOSIS — I509 Heart failure, unspecified: Secondary | ICD-10-CM | POA: Diagnosis not present

## 2022-07-16 DIAGNOSIS — Z96649 Presence of unspecified artificial hip joint: Secondary | ICD-10-CM | POA: Diagnosis not present

## 2022-07-16 DIAGNOSIS — M419 Scoliosis, unspecified: Secondary | ICD-10-CM | POA: Diagnosis not present

## 2022-07-16 DIAGNOSIS — M810 Age-related osteoporosis without current pathological fracture: Secondary | ICD-10-CM | POA: Diagnosis not present

## 2022-07-16 DIAGNOSIS — Z85828 Personal history of other malignant neoplasm of skin: Secondary | ICD-10-CM | POA: Diagnosis not present

## 2022-07-16 DIAGNOSIS — M150 Primary generalized (osteo)arthritis: Secondary | ICD-10-CM | POA: Diagnosis not present

## 2022-07-16 DIAGNOSIS — L03116 Cellulitis of left lower limb: Secondary | ICD-10-CM | POA: Diagnosis not present

## 2022-07-16 DIAGNOSIS — I110 Hypertensive heart disease with heart failure: Secondary | ICD-10-CM | POA: Diagnosis not present

## 2022-07-16 DIAGNOSIS — G8929 Other chronic pain: Secondary | ICD-10-CM | POA: Diagnosis not present

## 2022-07-16 DIAGNOSIS — I739 Peripheral vascular disease, unspecified: Secondary | ICD-10-CM | POA: Diagnosis not present

## 2022-07-16 DIAGNOSIS — E782 Mixed hyperlipidemia: Secondary | ICD-10-CM | POA: Diagnosis not present

## 2022-07-16 DIAGNOSIS — M25552 Pain in left hip: Secondary | ICD-10-CM | POA: Diagnosis not present

## 2022-07-16 DIAGNOSIS — I252 Old myocardial infarction: Secondary | ICD-10-CM | POA: Diagnosis not present

## 2022-07-16 DIAGNOSIS — J449 Chronic obstructive pulmonary disease, unspecified: Secondary | ICD-10-CM | POA: Diagnosis not present

## 2022-07-16 DIAGNOSIS — M25551 Pain in right hip: Secondary | ICD-10-CM | POA: Diagnosis not present

## 2022-07-18 DIAGNOSIS — Z96649 Presence of unspecified artificial hip joint: Secondary | ICD-10-CM | POA: Diagnosis not present

## 2022-07-18 DIAGNOSIS — E559 Vitamin D deficiency, unspecified: Secondary | ICD-10-CM | POA: Diagnosis not present

## 2022-07-18 DIAGNOSIS — I509 Heart failure, unspecified: Secondary | ICD-10-CM | POA: Diagnosis not present

## 2022-07-18 DIAGNOSIS — L03116 Cellulitis of left lower limb: Secondary | ICD-10-CM | POA: Diagnosis not present

## 2022-07-18 DIAGNOSIS — Z85828 Personal history of other malignant neoplasm of skin: Secondary | ICD-10-CM | POA: Diagnosis not present

## 2022-07-18 DIAGNOSIS — M25551 Pain in right hip: Secondary | ICD-10-CM | POA: Diagnosis not present

## 2022-07-18 DIAGNOSIS — E871 Hypo-osmolality and hyponatremia: Secondary | ICD-10-CM | POA: Diagnosis not present

## 2022-07-18 DIAGNOSIS — I252 Old myocardial infarction: Secondary | ICD-10-CM | POA: Diagnosis not present

## 2022-07-18 DIAGNOSIS — M549 Dorsalgia, unspecified: Secondary | ICD-10-CM | POA: Diagnosis not present

## 2022-07-18 DIAGNOSIS — I110 Hypertensive heart disease with heart failure: Secondary | ICD-10-CM | POA: Diagnosis not present

## 2022-07-18 DIAGNOSIS — I739 Peripheral vascular disease, unspecified: Secondary | ICD-10-CM | POA: Diagnosis not present

## 2022-07-18 DIAGNOSIS — M419 Scoliosis, unspecified: Secondary | ICD-10-CM | POA: Diagnosis not present

## 2022-07-18 DIAGNOSIS — M25552 Pain in left hip: Secondary | ICD-10-CM | POA: Diagnosis not present

## 2022-07-18 DIAGNOSIS — J449 Chronic obstructive pulmonary disease, unspecified: Secondary | ICD-10-CM | POA: Diagnosis not present

## 2022-07-18 DIAGNOSIS — I2510 Atherosclerotic heart disease of native coronary artery without angina pectoris: Secondary | ICD-10-CM | POA: Diagnosis not present

## 2022-07-18 DIAGNOSIS — M810 Age-related osteoporosis without current pathological fracture: Secondary | ICD-10-CM | POA: Diagnosis not present

## 2022-07-18 DIAGNOSIS — G8929 Other chronic pain: Secondary | ICD-10-CM | POA: Diagnosis not present

## 2022-07-18 DIAGNOSIS — M150 Primary generalized (osteo)arthritis: Secondary | ICD-10-CM | POA: Diagnosis not present

## 2022-07-18 DIAGNOSIS — E782 Mixed hyperlipidemia: Secondary | ICD-10-CM | POA: Diagnosis not present

## 2022-07-22 DIAGNOSIS — Z96649 Presence of unspecified artificial hip joint: Secondary | ICD-10-CM | POA: Diagnosis not present

## 2022-07-22 DIAGNOSIS — I110 Hypertensive heart disease with heart failure: Secondary | ICD-10-CM | POA: Diagnosis not present

## 2022-07-22 DIAGNOSIS — M25552 Pain in left hip: Secondary | ICD-10-CM | POA: Diagnosis not present

## 2022-07-22 DIAGNOSIS — I2510 Atherosclerotic heart disease of native coronary artery without angina pectoris: Secondary | ICD-10-CM | POA: Diagnosis not present

## 2022-07-22 DIAGNOSIS — I739 Peripheral vascular disease, unspecified: Secondary | ICD-10-CM | POA: Diagnosis not present

## 2022-07-22 DIAGNOSIS — J449 Chronic obstructive pulmonary disease, unspecified: Secondary | ICD-10-CM | POA: Diagnosis not present

## 2022-07-22 DIAGNOSIS — M150 Primary generalized (osteo)arthritis: Secondary | ICD-10-CM | POA: Diagnosis not present

## 2022-07-22 DIAGNOSIS — L03116 Cellulitis of left lower limb: Secondary | ICD-10-CM | POA: Diagnosis not present

## 2022-07-22 DIAGNOSIS — M25551 Pain in right hip: Secondary | ICD-10-CM | POA: Diagnosis not present

## 2022-07-22 DIAGNOSIS — I509 Heart failure, unspecified: Secondary | ICD-10-CM | POA: Diagnosis not present

## 2022-07-22 DIAGNOSIS — G8929 Other chronic pain: Secondary | ICD-10-CM | POA: Diagnosis not present

## 2022-07-22 DIAGNOSIS — M419 Scoliosis, unspecified: Secondary | ICD-10-CM | POA: Diagnosis not present

## 2022-07-22 DIAGNOSIS — E782 Mixed hyperlipidemia: Secondary | ICD-10-CM | POA: Diagnosis not present

## 2022-07-22 DIAGNOSIS — M810 Age-related osteoporosis without current pathological fracture: Secondary | ICD-10-CM | POA: Diagnosis not present

## 2022-07-22 DIAGNOSIS — Z85828 Personal history of other malignant neoplasm of skin: Secondary | ICD-10-CM | POA: Diagnosis not present

## 2022-07-22 DIAGNOSIS — M549 Dorsalgia, unspecified: Secondary | ICD-10-CM | POA: Diagnosis not present

## 2022-07-22 DIAGNOSIS — I252 Old myocardial infarction: Secondary | ICD-10-CM | POA: Diagnosis not present

## 2022-07-22 DIAGNOSIS — E871 Hypo-osmolality and hyponatremia: Secondary | ICD-10-CM | POA: Diagnosis not present

## 2022-07-22 DIAGNOSIS — E559 Vitamin D deficiency, unspecified: Secondary | ICD-10-CM | POA: Diagnosis not present

## 2022-07-26 DIAGNOSIS — Z85828 Personal history of other malignant neoplasm of skin: Secondary | ICD-10-CM | POA: Diagnosis not present

## 2022-07-26 DIAGNOSIS — I739 Peripheral vascular disease, unspecified: Secondary | ICD-10-CM | POA: Diagnosis not present

## 2022-07-26 DIAGNOSIS — I110 Hypertensive heart disease with heart failure: Secondary | ICD-10-CM | POA: Diagnosis not present

## 2022-07-26 DIAGNOSIS — E782 Mixed hyperlipidemia: Secondary | ICD-10-CM | POA: Diagnosis not present

## 2022-07-26 DIAGNOSIS — M549 Dorsalgia, unspecified: Secondary | ICD-10-CM | POA: Diagnosis not present

## 2022-07-26 DIAGNOSIS — M810 Age-related osteoporosis without current pathological fracture: Secondary | ICD-10-CM | POA: Diagnosis not present

## 2022-07-26 DIAGNOSIS — I2510 Atherosclerotic heart disease of native coronary artery without angina pectoris: Secondary | ICD-10-CM | POA: Diagnosis not present

## 2022-07-26 DIAGNOSIS — E559 Vitamin D deficiency, unspecified: Secondary | ICD-10-CM | POA: Diagnosis not present

## 2022-07-26 DIAGNOSIS — I252 Old myocardial infarction: Secondary | ICD-10-CM | POA: Diagnosis not present

## 2022-07-26 DIAGNOSIS — J449 Chronic obstructive pulmonary disease, unspecified: Secondary | ICD-10-CM | POA: Diagnosis not present

## 2022-07-26 DIAGNOSIS — M25551 Pain in right hip: Secondary | ICD-10-CM | POA: Diagnosis not present

## 2022-07-26 DIAGNOSIS — E871 Hypo-osmolality and hyponatremia: Secondary | ICD-10-CM | POA: Diagnosis not present

## 2022-07-26 DIAGNOSIS — L03116 Cellulitis of left lower limb: Secondary | ICD-10-CM | POA: Diagnosis not present

## 2022-07-26 DIAGNOSIS — M419 Scoliosis, unspecified: Secondary | ICD-10-CM | POA: Diagnosis not present

## 2022-07-26 DIAGNOSIS — I509 Heart failure, unspecified: Secondary | ICD-10-CM | POA: Diagnosis not present

## 2022-07-26 DIAGNOSIS — M25552 Pain in left hip: Secondary | ICD-10-CM | POA: Diagnosis not present

## 2022-07-26 DIAGNOSIS — Z96649 Presence of unspecified artificial hip joint: Secondary | ICD-10-CM | POA: Diagnosis not present

## 2022-07-26 DIAGNOSIS — M150 Primary generalized (osteo)arthritis: Secondary | ICD-10-CM | POA: Diagnosis not present

## 2022-07-26 DIAGNOSIS — G8929 Other chronic pain: Secondary | ICD-10-CM | POA: Diagnosis not present

## 2022-07-30 DIAGNOSIS — I110 Hypertensive heart disease with heart failure: Secondary | ICD-10-CM | POA: Diagnosis not present

## 2022-07-30 DIAGNOSIS — M419 Scoliosis, unspecified: Secondary | ICD-10-CM | POA: Diagnosis not present

## 2022-07-30 DIAGNOSIS — I509 Heart failure, unspecified: Secondary | ICD-10-CM | POA: Diagnosis not present

## 2022-07-30 DIAGNOSIS — M150 Primary generalized (osteo)arthritis: Secondary | ICD-10-CM | POA: Diagnosis not present

## 2022-07-30 DIAGNOSIS — I2510 Atherosclerotic heart disease of native coronary artery without angina pectoris: Secondary | ICD-10-CM | POA: Diagnosis not present

## 2022-07-30 DIAGNOSIS — M810 Age-related osteoporosis without current pathological fracture: Secondary | ICD-10-CM | POA: Diagnosis not present

## 2022-07-30 DIAGNOSIS — I739 Peripheral vascular disease, unspecified: Secondary | ICD-10-CM | POA: Diagnosis not present

## 2022-07-30 DIAGNOSIS — Z85828 Personal history of other malignant neoplasm of skin: Secondary | ICD-10-CM | POA: Diagnosis not present

## 2022-07-30 DIAGNOSIS — M25552 Pain in left hip: Secondary | ICD-10-CM | POA: Diagnosis not present

## 2022-07-30 DIAGNOSIS — G8929 Other chronic pain: Secondary | ICD-10-CM | POA: Diagnosis not present

## 2022-07-30 DIAGNOSIS — E782 Mixed hyperlipidemia: Secondary | ICD-10-CM | POA: Diagnosis not present

## 2022-07-30 DIAGNOSIS — I252 Old myocardial infarction: Secondary | ICD-10-CM | POA: Diagnosis not present

## 2022-07-30 DIAGNOSIS — Z96649 Presence of unspecified artificial hip joint: Secondary | ICD-10-CM | POA: Diagnosis not present

## 2022-07-30 DIAGNOSIS — M25551 Pain in right hip: Secondary | ICD-10-CM | POA: Diagnosis not present

## 2022-07-30 DIAGNOSIS — L03116 Cellulitis of left lower limb: Secondary | ICD-10-CM | POA: Diagnosis not present

## 2022-07-30 DIAGNOSIS — M549 Dorsalgia, unspecified: Secondary | ICD-10-CM | POA: Diagnosis not present

## 2022-07-30 DIAGNOSIS — E559 Vitamin D deficiency, unspecified: Secondary | ICD-10-CM | POA: Diagnosis not present

## 2022-07-30 DIAGNOSIS — J449 Chronic obstructive pulmonary disease, unspecified: Secondary | ICD-10-CM | POA: Diagnosis not present

## 2022-07-30 DIAGNOSIS — E871 Hypo-osmolality and hyponatremia: Secondary | ICD-10-CM | POA: Diagnosis not present

## 2022-07-31 DIAGNOSIS — I10 Essential (primary) hypertension: Secondary | ICD-10-CM | POA: Diagnosis not present

## 2022-07-31 DIAGNOSIS — D519 Vitamin B12 deficiency anemia, unspecified: Secondary | ICD-10-CM | POA: Diagnosis not present

## 2022-07-31 DIAGNOSIS — J449 Chronic obstructive pulmonary disease, unspecified: Secondary | ICD-10-CM | POA: Diagnosis not present

## 2022-07-31 DIAGNOSIS — R42 Dizziness and giddiness: Secondary | ICD-10-CM | POA: Diagnosis not present

## 2022-07-31 DIAGNOSIS — E782 Mixed hyperlipidemia: Secondary | ICD-10-CM | POA: Diagnosis not present

## 2022-07-31 DIAGNOSIS — E871 Hypo-osmolality and hyponatremia: Secondary | ICD-10-CM | POA: Diagnosis not present

## 2022-07-31 DIAGNOSIS — E559 Vitamin D deficiency, unspecified: Secondary | ICD-10-CM | POA: Diagnosis not present

## 2022-08-02 DIAGNOSIS — E871 Hypo-osmolality and hyponatremia: Secondary | ICD-10-CM | POA: Diagnosis not present

## 2022-08-02 DIAGNOSIS — I2510 Atherosclerotic heart disease of native coronary artery without angina pectoris: Secondary | ICD-10-CM | POA: Diagnosis not present

## 2022-08-02 DIAGNOSIS — I252 Old myocardial infarction: Secondary | ICD-10-CM | POA: Diagnosis not present

## 2022-08-02 DIAGNOSIS — I509 Heart failure, unspecified: Secondary | ICD-10-CM | POA: Diagnosis not present

## 2022-08-02 DIAGNOSIS — I110 Hypertensive heart disease with heart failure: Secondary | ICD-10-CM | POA: Diagnosis not present

## 2022-08-02 DIAGNOSIS — L03116 Cellulitis of left lower limb: Secondary | ICD-10-CM | POA: Diagnosis not present

## 2022-08-02 DIAGNOSIS — I739 Peripheral vascular disease, unspecified: Secondary | ICD-10-CM | POA: Diagnosis not present

## 2022-08-02 DIAGNOSIS — M25551 Pain in right hip: Secondary | ICD-10-CM | POA: Diagnosis not present

## 2022-08-02 DIAGNOSIS — Z96649 Presence of unspecified artificial hip joint: Secondary | ICD-10-CM | POA: Diagnosis not present

## 2022-08-02 DIAGNOSIS — J449 Chronic obstructive pulmonary disease, unspecified: Secondary | ICD-10-CM | POA: Diagnosis not present

## 2022-08-02 DIAGNOSIS — Z85828 Personal history of other malignant neoplasm of skin: Secondary | ICD-10-CM | POA: Diagnosis not present

## 2022-08-02 DIAGNOSIS — E782 Mixed hyperlipidemia: Secondary | ICD-10-CM | POA: Diagnosis not present

## 2022-08-02 DIAGNOSIS — M150 Primary generalized (osteo)arthritis: Secondary | ICD-10-CM | POA: Diagnosis not present

## 2022-08-02 DIAGNOSIS — M810 Age-related osteoporosis without current pathological fracture: Secondary | ICD-10-CM | POA: Diagnosis not present

## 2022-08-02 DIAGNOSIS — E559 Vitamin D deficiency, unspecified: Secondary | ICD-10-CM | POA: Diagnosis not present

## 2022-08-02 DIAGNOSIS — M419 Scoliosis, unspecified: Secondary | ICD-10-CM | POA: Diagnosis not present

## 2022-08-02 DIAGNOSIS — M549 Dorsalgia, unspecified: Secondary | ICD-10-CM | POA: Diagnosis not present

## 2022-08-02 DIAGNOSIS — G8929 Other chronic pain: Secondary | ICD-10-CM | POA: Diagnosis not present

## 2022-08-02 DIAGNOSIS — M25552 Pain in left hip: Secondary | ICD-10-CM | POA: Diagnosis not present

## 2022-08-06 DIAGNOSIS — M810 Age-related osteoporosis without current pathological fracture: Secondary | ICD-10-CM | POA: Diagnosis not present

## 2022-08-06 DIAGNOSIS — I252 Old myocardial infarction: Secondary | ICD-10-CM | POA: Diagnosis not present

## 2022-08-06 DIAGNOSIS — G8929 Other chronic pain: Secondary | ICD-10-CM | POA: Diagnosis not present

## 2022-08-06 DIAGNOSIS — M549 Dorsalgia, unspecified: Secondary | ICD-10-CM | POA: Diagnosis not present

## 2022-08-06 DIAGNOSIS — M419 Scoliosis, unspecified: Secondary | ICD-10-CM | POA: Diagnosis not present

## 2022-08-06 DIAGNOSIS — M25551 Pain in right hip: Secondary | ICD-10-CM | POA: Diagnosis not present

## 2022-08-06 DIAGNOSIS — E782 Mixed hyperlipidemia: Secondary | ICD-10-CM | POA: Diagnosis not present

## 2022-08-06 DIAGNOSIS — I2510 Atherosclerotic heart disease of native coronary artery without angina pectoris: Secondary | ICD-10-CM | POA: Diagnosis not present

## 2022-08-06 DIAGNOSIS — M150 Primary generalized (osteo)arthritis: Secondary | ICD-10-CM | POA: Diagnosis not present

## 2022-08-06 DIAGNOSIS — I739 Peripheral vascular disease, unspecified: Secondary | ICD-10-CM | POA: Diagnosis not present

## 2022-08-06 DIAGNOSIS — I110 Hypertensive heart disease with heart failure: Secondary | ICD-10-CM | POA: Diagnosis not present

## 2022-08-06 DIAGNOSIS — Z96649 Presence of unspecified artificial hip joint: Secondary | ICD-10-CM | POA: Diagnosis not present

## 2022-08-06 DIAGNOSIS — E559 Vitamin D deficiency, unspecified: Secondary | ICD-10-CM | POA: Diagnosis not present

## 2022-08-06 DIAGNOSIS — E871 Hypo-osmolality and hyponatremia: Secondary | ICD-10-CM | POA: Diagnosis not present

## 2022-08-06 DIAGNOSIS — L03116 Cellulitis of left lower limb: Secondary | ICD-10-CM | POA: Diagnosis not present

## 2022-08-06 DIAGNOSIS — I509 Heart failure, unspecified: Secondary | ICD-10-CM | POA: Diagnosis not present

## 2022-08-06 DIAGNOSIS — M25552 Pain in left hip: Secondary | ICD-10-CM | POA: Diagnosis not present

## 2022-08-06 DIAGNOSIS — Z85828 Personal history of other malignant neoplasm of skin: Secondary | ICD-10-CM | POA: Diagnosis not present

## 2022-08-06 DIAGNOSIS — J449 Chronic obstructive pulmonary disease, unspecified: Secondary | ICD-10-CM | POA: Diagnosis not present

## 2022-08-09 DIAGNOSIS — I110 Hypertensive heart disease with heart failure: Secondary | ICD-10-CM | POA: Diagnosis not present

## 2022-08-09 DIAGNOSIS — J449 Chronic obstructive pulmonary disease, unspecified: Secondary | ICD-10-CM | POA: Diagnosis not present

## 2022-08-09 DIAGNOSIS — M25552 Pain in left hip: Secondary | ICD-10-CM | POA: Diagnosis not present

## 2022-08-09 DIAGNOSIS — I509 Heart failure, unspecified: Secondary | ICD-10-CM | POA: Diagnosis not present

## 2022-08-09 DIAGNOSIS — I252 Old myocardial infarction: Secondary | ICD-10-CM | POA: Diagnosis not present

## 2022-08-09 DIAGNOSIS — M419 Scoliosis, unspecified: Secondary | ICD-10-CM | POA: Diagnosis not present

## 2022-08-09 DIAGNOSIS — Z85828 Personal history of other malignant neoplasm of skin: Secondary | ICD-10-CM | POA: Diagnosis not present

## 2022-08-09 DIAGNOSIS — Z96649 Presence of unspecified artificial hip joint: Secondary | ICD-10-CM | POA: Diagnosis not present

## 2022-08-09 DIAGNOSIS — M549 Dorsalgia, unspecified: Secondary | ICD-10-CM | POA: Diagnosis not present

## 2022-08-09 DIAGNOSIS — M25551 Pain in right hip: Secondary | ICD-10-CM | POA: Diagnosis not present

## 2022-08-09 DIAGNOSIS — M150 Primary generalized (osteo)arthritis: Secondary | ICD-10-CM | POA: Diagnosis not present

## 2022-08-09 DIAGNOSIS — E871 Hypo-osmolality and hyponatremia: Secondary | ICD-10-CM | POA: Diagnosis not present

## 2022-08-09 DIAGNOSIS — L03116 Cellulitis of left lower limb: Secondary | ICD-10-CM | POA: Diagnosis not present

## 2022-08-09 DIAGNOSIS — M810 Age-related osteoporosis without current pathological fracture: Secondary | ICD-10-CM | POA: Diagnosis not present

## 2022-08-09 DIAGNOSIS — I2510 Atherosclerotic heart disease of native coronary artery without angina pectoris: Secondary | ICD-10-CM | POA: Diagnosis not present

## 2022-08-09 DIAGNOSIS — I739 Peripheral vascular disease, unspecified: Secondary | ICD-10-CM | POA: Diagnosis not present

## 2022-08-09 DIAGNOSIS — E559 Vitamin D deficiency, unspecified: Secondary | ICD-10-CM | POA: Diagnosis not present

## 2022-08-09 DIAGNOSIS — G8929 Other chronic pain: Secondary | ICD-10-CM | POA: Diagnosis not present

## 2022-08-09 DIAGNOSIS — E782 Mixed hyperlipidemia: Secondary | ICD-10-CM | POA: Diagnosis not present

## 2022-08-13 DIAGNOSIS — L03116 Cellulitis of left lower limb: Secondary | ICD-10-CM | POA: Diagnosis not present

## 2022-08-13 DIAGNOSIS — M549 Dorsalgia, unspecified: Secondary | ICD-10-CM | POA: Diagnosis not present

## 2022-08-13 DIAGNOSIS — M810 Age-related osteoporosis without current pathological fracture: Secondary | ICD-10-CM | POA: Diagnosis not present

## 2022-08-13 DIAGNOSIS — I252 Old myocardial infarction: Secondary | ICD-10-CM | POA: Diagnosis not present

## 2022-08-13 DIAGNOSIS — G8929 Other chronic pain: Secondary | ICD-10-CM | POA: Diagnosis not present

## 2022-08-13 DIAGNOSIS — Z85828 Personal history of other malignant neoplasm of skin: Secondary | ICD-10-CM | POA: Diagnosis not present

## 2022-08-13 DIAGNOSIS — I2510 Atherosclerotic heart disease of native coronary artery without angina pectoris: Secondary | ICD-10-CM | POA: Diagnosis not present

## 2022-08-13 DIAGNOSIS — M419 Scoliosis, unspecified: Secondary | ICD-10-CM | POA: Diagnosis not present

## 2022-08-13 DIAGNOSIS — Z96649 Presence of unspecified artificial hip joint: Secondary | ICD-10-CM | POA: Diagnosis not present

## 2022-08-13 DIAGNOSIS — J449 Chronic obstructive pulmonary disease, unspecified: Secondary | ICD-10-CM | POA: Diagnosis not present

## 2022-08-13 DIAGNOSIS — I509 Heart failure, unspecified: Secondary | ICD-10-CM | POA: Diagnosis not present

## 2022-08-13 DIAGNOSIS — M25551 Pain in right hip: Secondary | ICD-10-CM | POA: Diagnosis not present

## 2022-08-13 DIAGNOSIS — E871 Hypo-osmolality and hyponatremia: Secondary | ICD-10-CM | POA: Diagnosis not present

## 2022-08-13 DIAGNOSIS — I110 Hypertensive heart disease with heart failure: Secondary | ICD-10-CM | POA: Diagnosis not present

## 2022-08-13 DIAGNOSIS — M150 Primary generalized (osteo)arthritis: Secondary | ICD-10-CM | POA: Diagnosis not present

## 2022-08-13 DIAGNOSIS — I739 Peripheral vascular disease, unspecified: Secondary | ICD-10-CM | POA: Diagnosis not present

## 2022-08-13 DIAGNOSIS — E782 Mixed hyperlipidemia: Secondary | ICD-10-CM | POA: Diagnosis not present

## 2022-08-13 DIAGNOSIS — M25552 Pain in left hip: Secondary | ICD-10-CM | POA: Diagnosis not present

## 2022-08-13 DIAGNOSIS — E559 Vitamin D deficiency, unspecified: Secondary | ICD-10-CM | POA: Diagnosis not present

## 2022-08-16 DIAGNOSIS — M810 Age-related osteoporosis without current pathological fracture: Secondary | ICD-10-CM | POA: Diagnosis not present

## 2022-08-16 DIAGNOSIS — Z96649 Presence of unspecified artificial hip joint: Secondary | ICD-10-CM | POA: Diagnosis not present

## 2022-08-16 DIAGNOSIS — J449 Chronic obstructive pulmonary disease, unspecified: Secondary | ICD-10-CM | POA: Diagnosis not present

## 2022-08-16 DIAGNOSIS — M150 Primary generalized (osteo)arthritis: Secondary | ICD-10-CM | POA: Diagnosis not present

## 2022-08-16 DIAGNOSIS — E871 Hypo-osmolality and hyponatremia: Secondary | ICD-10-CM | POA: Diagnosis not present

## 2022-08-16 DIAGNOSIS — E559 Vitamin D deficiency, unspecified: Secondary | ICD-10-CM | POA: Diagnosis not present

## 2022-08-16 DIAGNOSIS — L03116 Cellulitis of left lower limb: Secondary | ICD-10-CM | POA: Diagnosis not present

## 2022-08-16 DIAGNOSIS — I509 Heart failure, unspecified: Secondary | ICD-10-CM | POA: Diagnosis not present

## 2022-08-16 DIAGNOSIS — G8929 Other chronic pain: Secondary | ICD-10-CM | POA: Diagnosis not present

## 2022-08-16 DIAGNOSIS — I739 Peripheral vascular disease, unspecified: Secondary | ICD-10-CM | POA: Diagnosis not present

## 2022-08-16 DIAGNOSIS — I2510 Atherosclerotic heart disease of native coronary artery without angina pectoris: Secondary | ICD-10-CM | POA: Diagnosis not present

## 2022-08-16 DIAGNOSIS — M549 Dorsalgia, unspecified: Secondary | ICD-10-CM | POA: Diagnosis not present

## 2022-08-16 DIAGNOSIS — I252 Old myocardial infarction: Secondary | ICD-10-CM | POA: Diagnosis not present

## 2022-08-16 DIAGNOSIS — E782 Mixed hyperlipidemia: Secondary | ICD-10-CM | POA: Diagnosis not present

## 2022-08-16 DIAGNOSIS — M25551 Pain in right hip: Secondary | ICD-10-CM | POA: Diagnosis not present

## 2022-08-16 DIAGNOSIS — M419 Scoliosis, unspecified: Secondary | ICD-10-CM | POA: Diagnosis not present

## 2022-08-16 DIAGNOSIS — I110 Hypertensive heart disease with heart failure: Secondary | ICD-10-CM | POA: Diagnosis not present

## 2022-08-16 DIAGNOSIS — M25552 Pain in left hip: Secondary | ICD-10-CM | POA: Diagnosis not present

## 2022-08-16 DIAGNOSIS — Z85828 Personal history of other malignant neoplasm of skin: Secondary | ICD-10-CM | POA: Diagnosis not present

## 2022-08-20 DIAGNOSIS — I2510 Atherosclerotic heart disease of native coronary artery without angina pectoris: Secondary | ICD-10-CM | POA: Diagnosis not present

## 2022-08-20 DIAGNOSIS — M419 Scoliosis, unspecified: Secondary | ICD-10-CM | POA: Diagnosis not present

## 2022-08-20 DIAGNOSIS — I739 Peripheral vascular disease, unspecified: Secondary | ICD-10-CM | POA: Diagnosis not present

## 2022-08-20 DIAGNOSIS — Z96649 Presence of unspecified artificial hip joint: Secondary | ICD-10-CM | POA: Diagnosis not present

## 2022-08-20 DIAGNOSIS — I252 Old myocardial infarction: Secondary | ICD-10-CM | POA: Diagnosis not present

## 2022-08-20 DIAGNOSIS — M549 Dorsalgia, unspecified: Secondary | ICD-10-CM | POA: Diagnosis not present

## 2022-08-20 DIAGNOSIS — M150 Primary generalized (osteo)arthritis: Secondary | ICD-10-CM | POA: Diagnosis not present

## 2022-08-20 DIAGNOSIS — M25551 Pain in right hip: Secondary | ICD-10-CM | POA: Diagnosis not present

## 2022-08-20 DIAGNOSIS — E871 Hypo-osmolality and hyponatremia: Secondary | ICD-10-CM | POA: Diagnosis not present

## 2022-08-20 DIAGNOSIS — M25552 Pain in left hip: Secondary | ICD-10-CM | POA: Diagnosis not present

## 2022-08-20 DIAGNOSIS — E559 Vitamin D deficiency, unspecified: Secondary | ICD-10-CM | POA: Diagnosis not present

## 2022-08-20 DIAGNOSIS — L03116 Cellulitis of left lower limb: Secondary | ICD-10-CM | POA: Diagnosis not present

## 2022-08-20 DIAGNOSIS — J449 Chronic obstructive pulmonary disease, unspecified: Secondary | ICD-10-CM | POA: Diagnosis not present

## 2022-08-20 DIAGNOSIS — M810 Age-related osteoporosis without current pathological fracture: Secondary | ICD-10-CM | POA: Diagnosis not present

## 2022-08-20 DIAGNOSIS — I110 Hypertensive heart disease with heart failure: Secondary | ICD-10-CM | POA: Diagnosis not present

## 2022-08-20 DIAGNOSIS — G8929 Other chronic pain: Secondary | ICD-10-CM | POA: Diagnosis not present

## 2022-08-20 DIAGNOSIS — I509 Heart failure, unspecified: Secondary | ICD-10-CM | POA: Diagnosis not present

## 2022-08-20 DIAGNOSIS — Z85828 Personal history of other malignant neoplasm of skin: Secondary | ICD-10-CM | POA: Diagnosis not present

## 2022-08-20 DIAGNOSIS — E782 Mixed hyperlipidemia: Secondary | ICD-10-CM | POA: Diagnosis not present

## 2022-08-23 DIAGNOSIS — I739 Peripheral vascular disease, unspecified: Secondary | ICD-10-CM | POA: Diagnosis not present

## 2022-08-23 DIAGNOSIS — M25552 Pain in left hip: Secondary | ICD-10-CM | POA: Diagnosis not present

## 2022-08-23 DIAGNOSIS — J449 Chronic obstructive pulmonary disease, unspecified: Secondary | ICD-10-CM | POA: Diagnosis not present

## 2022-08-23 DIAGNOSIS — I2510 Atherosclerotic heart disease of native coronary artery without angina pectoris: Secondary | ICD-10-CM | POA: Diagnosis not present

## 2022-08-23 DIAGNOSIS — G8929 Other chronic pain: Secondary | ICD-10-CM | POA: Diagnosis not present

## 2022-08-23 DIAGNOSIS — I110 Hypertensive heart disease with heart failure: Secondary | ICD-10-CM | POA: Diagnosis not present

## 2022-08-23 DIAGNOSIS — M25551 Pain in right hip: Secondary | ICD-10-CM | POA: Diagnosis not present

## 2022-08-23 DIAGNOSIS — M810 Age-related osteoporosis without current pathological fracture: Secondary | ICD-10-CM | POA: Diagnosis not present

## 2022-08-23 DIAGNOSIS — I252 Old myocardial infarction: Secondary | ICD-10-CM | POA: Diagnosis not present

## 2022-08-23 DIAGNOSIS — I509 Heart failure, unspecified: Secondary | ICD-10-CM | POA: Diagnosis not present

## 2022-08-23 DIAGNOSIS — L03116 Cellulitis of left lower limb: Secondary | ICD-10-CM | POA: Diagnosis not present

## 2022-08-23 DIAGNOSIS — M549 Dorsalgia, unspecified: Secondary | ICD-10-CM | POA: Diagnosis not present

## 2022-08-23 DIAGNOSIS — E871 Hypo-osmolality and hyponatremia: Secondary | ICD-10-CM | POA: Diagnosis not present

## 2022-08-23 DIAGNOSIS — Z96649 Presence of unspecified artificial hip joint: Secondary | ICD-10-CM | POA: Diagnosis not present

## 2022-08-23 DIAGNOSIS — Z85828 Personal history of other malignant neoplasm of skin: Secondary | ICD-10-CM | POA: Diagnosis not present

## 2022-08-23 DIAGNOSIS — E559 Vitamin D deficiency, unspecified: Secondary | ICD-10-CM | POA: Diagnosis not present

## 2022-08-23 DIAGNOSIS — E782 Mixed hyperlipidemia: Secondary | ICD-10-CM | POA: Diagnosis not present

## 2022-08-23 DIAGNOSIS — M419 Scoliosis, unspecified: Secondary | ICD-10-CM | POA: Diagnosis not present

## 2022-08-23 DIAGNOSIS — M150 Primary generalized (osteo)arthritis: Secondary | ICD-10-CM | POA: Diagnosis not present

## 2022-08-26 DIAGNOSIS — E782 Mixed hyperlipidemia: Secondary | ICD-10-CM | POA: Diagnosis not present

## 2022-08-26 DIAGNOSIS — L03116 Cellulitis of left lower limb: Secondary | ICD-10-CM | POA: Diagnosis not present

## 2022-08-26 DIAGNOSIS — G8929 Other chronic pain: Secondary | ICD-10-CM | POA: Diagnosis not present

## 2022-08-26 DIAGNOSIS — Z85828 Personal history of other malignant neoplasm of skin: Secondary | ICD-10-CM | POA: Diagnosis not present

## 2022-08-26 DIAGNOSIS — M25551 Pain in right hip: Secondary | ICD-10-CM | POA: Diagnosis not present

## 2022-08-26 DIAGNOSIS — I509 Heart failure, unspecified: Secondary | ICD-10-CM | POA: Diagnosis not present

## 2022-08-26 DIAGNOSIS — M150 Primary generalized (osteo)arthritis: Secondary | ICD-10-CM | POA: Diagnosis not present

## 2022-08-26 DIAGNOSIS — I739 Peripheral vascular disease, unspecified: Secondary | ICD-10-CM | POA: Diagnosis not present

## 2022-08-26 DIAGNOSIS — M549 Dorsalgia, unspecified: Secondary | ICD-10-CM | POA: Diagnosis not present

## 2022-08-26 DIAGNOSIS — M419 Scoliosis, unspecified: Secondary | ICD-10-CM | POA: Diagnosis not present

## 2022-08-26 DIAGNOSIS — M25552 Pain in left hip: Secondary | ICD-10-CM | POA: Diagnosis not present

## 2022-08-26 DIAGNOSIS — M810 Age-related osteoporosis without current pathological fracture: Secondary | ICD-10-CM | POA: Diagnosis not present

## 2022-08-26 DIAGNOSIS — I110 Hypertensive heart disease with heart failure: Secondary | ICD-10-CM | POA: Diagnosis not present

## 2022-08-26 DIAGNOSIS — J449 Chronic obstructive pulmonary disease, unspecified: Secondary | ICD-10-CM | POA: Diagnosis not present

## 2022-08-26 DIAGNOSIS — I252 Old myocardial infarction: Secondary | ICD-10-CM | POA: Diagnosis not present

## 2022-08-26 DIAGNOSIS — I2510 Atherosclerotic heart disease of native coronary artery without angina pectoris: Secondary | ICD-10-CM | POA: Diagnosis not present

## 2022-08-26 DIAGNOSIS — Z96649 Presence of unspecified artificial hip joint: Secondary | ICD-10-CM | POA: Diagnosis not present

## 2022-08-26 DIAGNOSIS — E559 Vitamin D deficiency, unspecified: Secondary | ICD-10-CM | POA: Diagnosis not present

## 2022-08-26 DIAGNOSIS — E871 Hypo-osmolality and hyponatremia: Secondary | ICD-10-CM | POA: Diagnosis not present

## 2022-08-29 DIAGNOSIS — M810 Age-related osteoporosis without current pathological fracture: Secondary | ICD-10-CM | POA: Diagnosis not present

## 2022-08-29 DIAGNOSIS — J449 Chronic obstructive pulmonary disease, unspecified: Secondary | ICD-10-CM | POA: Diagnosis not present

## 2022-08-29 DIAGNOSIS — Z96649 Presence of unspecified artificial hip joint: Secondary | ICD-10-CM | POA: Diagnosis not present

## 2022-08-29 DIAGNOSIS — I252 Old myocardial infarction: Secondary | ICD-10-CM | POA: Diagnosis not present

## 2022-08-29 DIAGNOSIS — M419 Scoliosis, unspecified: Secondary | ICD-10-CM | POA: Diagnosis not present

## 2022-08-29 DIAGNOSIS — E782 Mixed hyperlipidemia: Secondary | ICD-10-CM | POA: Diagnosis not present

## 2022-08-29 DIAGNOSIS — G8929 Other chronic pain: Secondary | ICD-10-CM | POA: Diagnosis not present

## 2022-08-29 DIAGNOSIS — I739 Peripheral vascular disease, unspecified: Secondary | ICD-10-CM | POA: Diagnosis not present

## 2022-08-29 DIAGNOSIS — I509 Heart failure, unspecified: Secondary | ICD-10-CM | POA: Diagnosis not present

## 2022-08-29 DIAGNOSIS — M25552 Pain in left hip: Secondary | ICD-10-CM | POA: Diagnosis not present

## 2022-08-29 DIAGNOSIS — I110 Hypertensive heart disease with heart failure: Secondary | ICD-10-CM | POA: Diagnosis not present

## 2022-08-29 DIAGNOSIS — E559 Vitamin D deficiency, unspecified: Secondary | ICD-10-CM | POA: Diagnosis not present

## 2022-08-29 DIAGNOSIS — L03116 Cellulitis of left lower limb: Secondary | ICD-10-CM | POA: Diagnosis not present

## 2022-08-29 DIAGNOSIS — M150 Primary generalized (osteo)arthritis: Secondary | ICD-10-CM | POA: Diagnosis not present

## 2022-08-29 DIAGNOSIS — E871 Hypo-osmolality and hyponatremia: Secondary | ICD-10-CM | POA: Diagnosis not present

## 2022-08-29 DIAGNOSIS — M25551 Pain in right hip: Secondary | ICD-10-CM | POA: Diagnosis not present

## 2022-08-29 DIAGNOSIS — I2510 Atherosclerotic heart disease of native coronary artery without angina pectoris: Secondary | ICD-10-CM | POA: Diagnosis not present

## 2022-08-29 DIAGNOSIS — Z85828 Personal history of other malignant neoplasm of skin: Secondary | ICD-10-CM | POA: Diagnosis not present

## 2022-08-29 DIAGNOSIS — M549 Dorsalgia, unspecified: Secondary | ICD-10-CM | POA: Diagnosis not present

## 2022-09-03 DIAGNOSIS — G8929 Other chronic pain: Secondary | ICD-10-CM | POA: Diagnosis not present

## 2022-09-03 DIAGNOSIS — Z96649 Presence of unspecified artificial hip joint: Secondary | ICD-10-CM | POA: Diagnosis not present

## 2022-09-03 DIAGNOSIS — I252 Old myocardial infarction: Secondary | ICD-10-CM | POA: Diagnosis not present

## 2022-09-03 DIAGNOSIS — I2510 Atherosclerotic heart disease of native coronary artery without angina pectoris: Secondary | ICD-10-CM | POA: Diagnosis not present

## 2022-09-03 DIAGNOSIS — E871 Hypo-osmolality and hyponatremia: Secondary | ICD-10-CM | POA: Diagnosis not present

## 2022-09-03 DIAGNOSIS — E782 Mixed hyperlipidemia: Secondary | ICD-10-CM | POA: Diagnosis not present

## 2022-09-03 DIAGNOSIS — L03116 Cellulitis of left lower limb: Secondary | ICD-10-CM | POA: Diagnosis not present

## 2022-09-03 DIAGNOSIS — I509 Heart failure, unspecified: Secondary | ICD-10-CM | POA: Diagnosis not present

## 2022-09-03 DIAGNOSIS — Z85828 Personal history of other malignant neoplasm of skin: Secondary | ICD-10-CM | POA: Diagnosis not present

## 2022-09-03 DIAGNOSIS — M549 Dorsalgia, unspecified: Secondary | ICD-10-CM | POA: Diagnosis not present

## 2022-09-03 DIAGNOSIS — M150 Primary generalized (osteo)arthritis: Secondary | ICD-10-CM | POA: Diagnosis not present

## 2022-09-03 DIAGNOSIS — I739 Peripheral vascular disease, unspecified: Secondary | ICD-10-CM | POA: Diagnosis not present

## 2022-09-03 DIAGNOSIS — M810 Age-related osteoporosis without current pathological fracture: Secondary | ICD-10-CM | POA: Diagnosis not present

## 2022-09-03 DIAGNOSIS — J449 Chronic obstructive pulmonary disease, unspecified: Secondary | ICD-10-CM | POA: Diagnosis not present

## 2022-09-03 DIAGNOSIS — M25552 Pain in left hip: Secondary | ICD-10-CM | POA: Diagnosis not present

## 2022-09-03 DIAGNOSIS — M419 Scoliosis, unspecified: Secondary | ICD-10-CM | POA: Diagnosis not present

## 2022-09-03 DIAGNOSIS — M25551 Pain in right hip: Secondary | ICD-10-CM | POA: Diagnosis not present

## 2022-09-03 DIAGNOSIS — E559 Vitamin D deficiency, unspecified: Secondary | ICD-10-CM | POA: Diagnosis not present

## 2022-09-03 DIAGNOSIS — I110 Hypertensive heart disease with heart failure: Secondary | ICD-10-CM | POA: Diagnosis not present

## 2022-09-04 DIAGNOSIS — F32A Depression, unspecified: Secondary | ICD-10-CM | POA: Diagnosis not present

## 2022-09-04 DIAGNOSIS — E782 Mixed hyperlipidemia: Secondary | ICD-10-CM | POA: Diagnosis not present

## 2022-09-04 DIAGNOSIS — J449 Chronic obstructive pulmonary disease, unspecified: Secondary | ICD-10-CM | POA: Diagnosis not present

## 2022-09-04 DIAGNOSIS — I10 Essential (primary) hypertension: Secondary | ICD-10-CM | POA: Diagnosis not present

## 2022-09-05 DIAGNOSIS — M549 Dorsalgia, unspecified: Secondary | ICD-10-CM | POA: Diagnosis not present

## 2022-09-05 DIAGNOSIS — Z85828 Personal history of other malignant neoplasm of skin: Secondary | ICD-10-CM | POA: Diagnosis not present

## 2022-09-05 DIAGNOSIS — M419 Scoliosis, unspecified: Secondary | ICD-10-CM | POA: Diagnosis not present

## 2022-09-05 DIAGNOSIS — I739 Peripheral vascular disease, unspecified: Secondary | ICD-10-CM | POA: Diagnosis not present

## 2022-09-05 DIAGNOSIS — E782 Mixed hyperlipidemia: Secondary | ICD-10-CM | POA: Diagnosis not present

## 2022-09-05 DIAGNOSIS — E559 Vitamin D deficiency, unspecified: Secondary | ICD-10-CM | POA: Diagnosis not present

## 2022-09-05 DIAGNOSIS — I2510 Atherosclerotic heart disease of native coronary artery without angina pectoris: Secondary | ICD-10-CM | POA: Diagnosis not present

## 2022-09-05 DIAGNOSIS — J449 Chronic obstructive pulmonary disease, unspecified: Secondary | ICD-10-CM | POA: Diagnosis not present

## 2022-09-05 DIAGNOSIS — L03116 Cellulitis of left lower limb: Secondary | ICD-10-CM | POA: Diagnosis not present

## 2022-09-05 DIAGNOSIS — I110 Hypertensive heart disease with heart failure: Secondary | ICD-10-CM | POA: Diagnosis not present

## 2022-09-05 DIAGNOSIS — M150 Primary generalized (osteo)arthritis: Secondary | ICD-10-CM | POA: Diagnosis not present

## 2022-09-05 DIAGNOSIS — M25551 Pain in right hip: Secondary | ICD-10-CM | POA: Diagnosis not present

## 2022-09-05 DIAGNOSIS — M25552 Pain in left hip: Secondary | ICD-10-CM | POA: Diagnosis not present

## 2022-09-05 DIAGNOSIS — I509 Heart failure, unspecified: Secondary | ICD-10-CM | POA: Diagnosis not present

## 2022-09-05 DIAGNOSIS — M810 Age-related osteoporosis without current pathological fracture: Secondary | ICD-10-CM | POA: Diagnosis not present

## 2022-09-05 DIAGNOSIS — I252 Old myocardial infarction: Secondary | ICD-10-CM | POA: Diagnosis not present

## 2022-09-05 DIAGNOSIS — E871 Hypo-osmolality and hyponatremia: Secondary | ICD-10-CM | POA: Diagnosis not present

## 2022-09-05 DIAGNOSIS — G8929 Other chronic pain: Secondary | ICD-10-CM | POA: Diagnosis not present

## 2022-09-05 DIAGNOSIS — Z96649 Presence of unspecified artificial hip joint: Secondary | ICD-10-CM | POA: Diagnosis not present

## 2022-09-10 DIAGNOSIS — Z96649 Presence of unspecified artificial hip joint: Secondary | ICD-10-CM | POA: Diagnosis not present

## 2022-09-10 DIAGNOSIS — I10 Essential (primary) hypertension: Secondary | ICD-10-CM | POA: Diagnosis not present

## 2022-09-10 DIAGNOSIS — E559 Vitamin D deficiency, unspecified: Secondary | ICD-10-CM | POA: Diagnosis not present

## 2022-09-10 DIAGNOSIS — E871 Hypo-osmolality and hyponatremia: Secondary | ICD-10-CM | POA: Diagnosis not present

## 2022-09-10 DIAGNOSIS — G8929 Other chronic pain: Secondary | ICD-10-CM | POA: Diagnosis not present

## 2022-09-10 DIAGNOSIS — I509 Heart failure, unspecified: Secondary | ICD-10-CM | POA: Diagnosis not present

## 2022-09-10 DIAGNOSIS — M150 Primary generalized (osteo)arthritis: Secondary | ICD-10-CM | POA: Diagnosis not present

## 2022-09-10 DIAGNOSIS — M419 Scoliosis, unspecified: Secondary | ICD-10-CM | POA: Diagnosis not present

## 2022-09-10 DIAGNOSIS — M25552 Pain in left hip: Secondary | ICD-10-CM | POA: Diagnosis not present

## 2022-09-10 DIAGNOSIS — Z85828 Personal history of other malignant neoplasm of skin: Secondary | ICD-10-CM | POA: Diagnosis not present

## 2022-09-10 DIAGNOSIS — E782 Mixed hyperlipidemia: Secondary | ICD-10-CM | POA: Diagnosis not present

## 2022-09-10 DIAGNOSIS — M549 Dorsalgia, unspecified: Secondary | ICD-10-CM | POA: Diagnosis not present

## 2022-09-10 DIAGNOSIS — I2510 Atherosclerotic heart disease of native coronary artery without angina pectoris: Secondary | ICD-10-CM | POA: Diagnosis not present

## 2022-09-10 DIAGNOSIS — I110 Hypertensive heart disease with heart failure: Secondary | ICD-10-CM | POA: Diagnosis not present

## 2022-09-10 DIAGNOSIS — M25551 Pain in right hip: Secondary | ICD-10-CM | POA: Diagnosis not present

## 2022-09-10 DIAGNOSIS — L03116 Cellulitis of left lower limb: Secondary | ICD-10-CM | POA: Diagnosis not present

## 2022-09-10 DIAGNOSIS — M810 Age-related osteoporosis without current pathological fracture: Secondary | ICD-10-CM | POA: Diagnosis not present

## 2022-09-10 DIAGNOSIS — J449 Chronic obstructive pulmonary disease, unspecified: Secondary | ICD-10-CM | POA: Diagnosis not present

## 2022-09-10 DIAGNOSIS — I252 Old myocardial infarction: Secondary | ICD-10-CM | POA: Diagnosis not present

## 2022-09-10 DIAGNOSIS — I739 Peripheral vascular disease, unspecified: Secondary | ICD-10-CM | POA: Diagnosis not present

## 2022-09-12 DIAGNOSIS — I509 Heart failure, unspecified: Secondary | ICD-10-CM | POA: Diagnosis not present

## 2022-09-12 DIAGNOSIS — I2510 Atherosclerotic heart disease of native coronary artery without angina pectoris: Secondary | ICD-10-CM | POA: Diagnosis not present

## 2022-09-12 DIAGNOSIS — M810 Age-related osteoporosis without current pathological fracture: Secondary | ICD-10-CM | POA: Diagnosis not present

## 2022-09-12 DIAGNOSIS — M549 Dorsalgia, unspecified: Secondary | ICD-10-CM | POA: Diagnosis not present

## 2022-09-12 DIAGNOSIS — G8929 Other chronic pain: Secondary | ICD-10-CM | POA: Diagnosis not present

## 2022-09-12 DIAGNOSIS — J449 Chronic obstructive pulmonary disease, unspecified: Secondary | ICD-10-CM | POA: Diagnosis not present

## 2022-09-12 DIAGNOSIS — E782 Mixed hyperlipidemia: Secondary | ICD-10-CM | POA: Diagnosis not present

## 2022-09-12 DIAGNOSIS — I110 Hypertensive heart disease with heart failure: Secondary | ICD-10-CM | POA: Diagnosis not present

## 2022-09-12 DIAGNOSIS — M25552 Pain in left hip: Secondary | ICD-10-CM | POA: Diagnosis not present

## 2022-09-12 DIAGNOSIS — M25551 Pain in right hip: Secondary | ICD-10-CM | POA: Diagnosis not present

## 2022-09-12 DIAGNOSIS — I252 Old myocardial infarction: Secondary | ICD-10-CM | POA: Diagnosis not present

## 2022-09-12 DIAGNOSIS — M150 Primary generalized (osteo)arthritis: Secondary | ICD-10-CM | POA: Diagnosis not present

## 2022-09-12 DIAGNOSIS — E871 Hypo-osmolality and hyponatremia: Secondary | ICD-10-CM | POA: Diagnosis not present

## 2022-09-12 DIAGNOSIS — Z85828 Personal history of other malignant neoplasm of skin: Secondary | ICD-10-CM | POA: Diagnosis not present

## 2022-09-12 DIAGNOSIS — Z96649 Presence of unspecified artificial hip joint: Secondary | ICD-10-CM | POA: Diagnosis not present

## 2022-09-12 DIAGNOSIS — M419 Scoliosis, unspecified: Secondary | ICD-10-CM | POA: Diagnosis not present

## 2022-09-12 DIAGNOSIS — L03116 Cellulitis of left lower limb: Secondary | ICD-10-CM | POA: Diagnosis not present

## 2022-09-12 DIAGNOSIS — E559 Vitamin D deficiency, unspecified: Secondary | ICD-10-CM | POA: Diagnosis not present

## 2022-09-12 DIAGNOSIS — I739 Peripheral vascular disease, unspecified: Secondary | ICD-10-CM | POA: Diagnosis not present

## 2022-09-17 DIAGNOSIS — I739 Peripheral vascular disease, unspecified: Secondary | ICD-10-CM | POA: Diagnosis not present

## 2022-09-17 DIAGNOSIS — M25552 Pain in left hip: Secondary | ICD-10-CM | POA: Diagnosis not present

## 2022-09-17 DIAGNOSIS — I110 Hypertensive heart disease with heart failure: Secondary | ICD-10-CM | POA: Diagnosis not present

## 2022-09-17 DIAGNOSIS — I252 Old myocardial infarction: Secondary | ICD-10-CM | POA: Diagnosis not present

## 2022-09-17 DIAGNOSIS — M150 Primary generalized (osteo)arthritis: Secondary | ICD-10-CM | POA: Diagnosis not present

## 2022-09-17 DIAGNOSIS — E559 Vitamin D deficiency, unspecified: Secondary | ICD-10-CM | POA: Diagnosis not present

## 2022-09-17 DIAGNOSIS — M810 Age-related osteoporosis without current pathological fracture: Secondary | ICD-10-CM | POA: Diagnosis not present

## 2022-09-17 DIAGNOSIS — Z85828 Personal history of other malignant neoplasm of skin: Secondary | ICD-10-CM | POA: Diagnosis not present

## 2022-09-17 DIAGNOSIS — L03116 Cellulitis of left lower limb: Secondary | ICD-10-CM | POA: Diagnosis not present

## 2022-09-17 DIAGNOSIS — M419 Scoliosis, unspecified: Secondary | ICD-10-CM | POA: Diagnosis not present

## 2022-09-17 DIAGNOSIS — G8929 Other chronic pain: Secondary | ICD-10-CM | POA: Diagnosis not present

## 2022-09-17 DIAGNOSIS — Z96649 Presence of unspecified artificial hip joint: Secondary | ICD-10-CM | POA: Diagnosis not present

## 2022-09-17 DIAGNOSIS — I509 Heart failure, unspecified: Secondary | ICD-10-CM | POA: Diagnosis not present

## 2022-09-17 DIAGNOSIS — M25551 Pain in right hip: Secondary | ICD-10-CM | POA: Diagnosis not present

## 2022-09-17 DIAGNOSIS — I2510 Atherosclerotic heart disease of native coronary artery without angina pectoris: Secondary | ICD-10-CM | POA: Diagnosis not present

## 2022-09-17 DIAGNOSIS — M549 Dorsalgia, unspecified: Secondary | ICD-10-CM | POA: Diagnosis not present

## 2022-09-17 DIAGNOSIS — E782 Mixed hyperlipidemia: Secondary | ICD-10-CM | POA: Diagnosis not present

## 2022-09-17 DIAGNOSIS — J449 Chronic obstructive pulmonary disease, unspecified: Secondary | ICD-10-CM | POA: Diagnosis not present

## 2022-09-17 DIAGNOSIS — E871 Hypo-osmolality and hyponatremia: Secondary | ICD-10-CM | POA: Diagnosis not present

## 2022-09-20 DIAGNOSIS — M25552 Pain in left hip: Secondary | ICD-10-CM | POA: Diagnosis not present

## 2022-09-20 DIAGNOSIS — M549 Dorsalgia, unspecified: Secondary | ICD-10-CM | POA: Diagnosis not present

## 2022-09-20 DIAGNOSIS — I739 Peripheral vascular disease, unspecified: Secondary | ICD-10-CM | POA: Diagnosis not present

## 2022-09-20 DIAGNOSIS — J449 Chronic obstructive pulmonary disease, unspecified: Secondary | ICD-10-CM | POA: Diagnosis not present

## 2022-09-20 DIAGNOSIS — M810 Age-related osteoporosis without current pathological fracture: Secondary | ICD-10-CM | POA: Diagnosis not present

## 2022-09-20 DIAGNOSIS — E782 Mixed hyperlipidemia: Secondary | ICD-10-CM | POA: Diagnosis not present

## 2022-09-20 DIAGNOSIS — M419 Scoliosis, unspecified: Secondary | ICD-10-CM | POA: Diagnosis not present

## 2022-09-20 DIAGNOSIS — L03116 Cellulitis of left lower limb: Secondary | ICD-10-CM | POA: Diagnosis not present

## 2022-09-20 DIAGNOSIS — I252 Old myocardial infarction: Secondary | ICD-10-CM | POA: Diagnosis not present

## 2022-09-20 DIAGNOSIS — E871 Hypo-osmolality and hyponatremia: Secondary | ICD-10-CM | POA: Diagnosis not present

## 2022-09-20 DIAGNOSIS — I110 Hypertensive heart disease with heart failure: Secondary | ICD-10-CM | POA: Diagnosis not present

## 2022-09-20 DIAGNOSIS — G8929 Other chronic pain: Secondary | ICD-10-CM | POA: Diagnosis not present

## 2022-09-20 DIAGNOSIS — M25551 Pain in right hip: Secondary | ICD-10-CM | POA: Diagnosis not present

## 2022-09-20 DIAGNOSIS — M150 Primary generalized (osteo)arthritis: Secondary | ICD-10-CM | POA: Diagnosis not present

## 2022-09-20 DIAGNOSIS — I509 Heart failure, unspecified: Secondary | ICD-10-CM | POA: Diagnosis not present

## 2022-09-20 DIAGNOSIS — Z85828 Personal history of other malignant neoplasm of skin: Secondary | ICD-10-CM | POA: Diagnosis not present

## 2022-09-20 DIAGNOSIS — Z96649 Presence of unspecified artificial hip joint: Secondary | ICD-10-CM | POA: Diagnosis not present

## 2022-09-20 DIAGNOSIS — I2510 Atherosclerotic heart disease of native coronary artery without angina pectoris: Secondary | ICD-10-CM | POA: Diagnosis not present

## 2022-09-20 DIAGNOSIS — E559 Vitamin D deficiency, unspecified: Secondary | ICD-10-CM | POA: Diagnosis not present

## 2022-09-24 DIAGNOSIS — M150 Primary generalized (osteo)arthritis: Secondary | ICD-10-CM | POA: Diagnosis not present

## 2022-09-24 DIAGNOSIS — I252 Old myocardial infarction: Secondary | ICD-10-CM | POA: Diagnosis not present

## 2022-09-24 DIAGNOSIS — E871 Hypo-osmolality and hyponatremia: Secondary | ICD-10-CM | POA: Diagnosis not present

## 2022-09-24 DIAGNOSIS — M419 Scoliosis, unspecified: Secondary | ICD-10-CM | POA: Diagnosis not present

## 2022-09-24 DIAGNOSIS — M25551 Pain in right hip: Secondary | ICD-10-CM | POA: Diagnosis not present

## 2022-09-24 DIAGNOSIS — M810 Age-related osteoporosis without current pathological fracture: Secondary | ICD-10-CM | POA: Diagnosis not present

## 2022-09-24 DIAGNOSIS — E559 Vitamin D deficiency, unspecified: Secondary | ICD-10-CM | POA: Diagnosis not present

## 2022-09-24 DIAGNOSIS — I739 Peripheral vascular disease, unspecified: Secondary | ICD-10-CM | POA: Diagnosis not present

## 2022-09-24 DIAGNOSIS — M25552 Pain in left hip: Secondary | ICD-10-CM | POA: Diagnosis not present

## 2022-09-24 DIAGNOSIS — Z96649 Presence of unspecified artificial hip joint: Secondary | ICD-10-CM | POA: Diagnosis not present

## 2022-09-24 DIAGNOSIS — J449 Chronic obstructive pulmonary disease, unspecified: Secondary | ICD-10-CM | POA: Diagnosis not present

## 2022-09-24 DIAGNOSIS — L03116 Cellulitis of left lower limb: Secondary | ICD-10-CM | POA: Diagnosis not present

## 2022-09-24 DIAGNOSIS — I110 Hypertensive heart disease with heart failure: Secondary | ICD-10-CM | POA: Diagnosis not present

## 2022-09-24 DIAGNOSIS — G8929 Other chronic pain: Secondary | ICD-10-CM | POA: Diagnosis not present

## 2022-09-24 DIAGNOSIS — I2510 Atherosclerotic heart disease of native coronary artery without angina pectoris: Secondary | ICD-10-CM | POA: Diagnosis not present

## 2022-09-24 DIAGNOSIS — E782 Mixed hyperlipidemia: Secondary | ICD-10-CM | POA: Diagnosis not present

## 2022-09-24 DIAGNOSIS — M549 Dorsalgia, unspecified: Secondary | ICD-10-CM | POA: Diagnosis not present

## 2022-09-24 DIAGNOSIS — Z85828 Personal history of other malignant neoplasm of skin: Secondary | ICD-10-CM | POA: Diagnosis not present

## 2022-09-24 DIAGNOSIS — I509 Heart failure, unspecified: Secondary | ICD-10-CM | POA: Diagnosis not present

## 2022-09-27 DIAGNOSIS — J449 Chronic obstructive pulmonary disease, unspecified: Secondary | ICD-10-CM | POA: Diagnosis not present

## 2022-09-27 DIAGNOSIS — L03116 Cellulitis of left lower limb: Secondary | ICD-10-CM | POA: Diagnosis not present

## 2022-09-27 DIAGNOSIS — Z96649 Presence of unspecified artificial hip joint: Secondary | ICD-10-CM | POA: Diagnosis not present

## 2022-09-27 DIAGNOSIS — I110 Hypertensive heart disease with heart failure: Secondary | ICD-10-CM | POA: Diagnosis not present

## 2022-09-27 DIAGNOSIS — I509 Heart failure, unspecified: Secondary | ICD-10-CM | POA: Diagnosis not present

## 2022-09-27 DIAGNOSIS — M810 Age-related osteoporosis without current pathological fracture: Secondary | ICD-10-CM | POA: Diagnosis not present

## 2022-09-27 DIAGNOSIS — I739 Peripheral vascular disease, unspecified: Secondary | ICD-10-CM | POA: Diagnosis not present

## 2022-09-27 DIAGNOSIS — M25551 Pain in right hip: Secondary | ICD-10-CM | POA: Diagnosis not present

## 2022-09-27 DIAGNOSIS — G8929 Other chronic pain: Secondary | ICD-10-CM | POA: Diagnosis not present

## 2022-09-27 DIAGNOSIS — I252 Old myocardial infarction: Secondary | ICD-10-CM | POA: Diagnosis not present

## 2022-09-27 DIAGNOSIS — E559 Vitamin D deficiency, unspecified: Secondary | ICD-10-CM | POA: Diagnosis not present

## 2022-09-27 DIAGNOSIS — M549 Dorsalgia, unspecified: Secondary | ICD-10-CM | POA: Diagnosis not present

## 2022-09-27 DIAGNOSIS — I2510 Atherosclerotic heart disease of native coronary artery without angina pectoris: Secondary | ICD-10-CM | POA: Diagnosis not present

## 2022-09-27 DIAGNOSIS — M419 Scoliosis, unspecified: Secondary | ICD-10-CM | POA: Diagnosis not present

## 2022-09-27 DIAGNOSIS — E782 Mixed hyperlipidemia: Secondary | ICD-10-CM | POA: Diagnosis not present

## 2022-09-27 DIAGNOSIS — E871 Hypo-osmolality and hyponatremia: Secondary | ICD-10-CM | POA: Diagnosis not present

## 2022-09-27 DIAGNOSIS — M25552 Pain in left hip: Secondary | ICD-10-CM | POA: Diagnosis not present

## 2022-09-27 DIAGNOSIS — M150 Primary generalized (osteo)arthritis: Secondary | ICD-10-CM | POA: Diagnosis not present

## 2022-09-27 DIAGNOSIS — Z85828 Personal history of other malignant neoplasm of skin: Secondary | ICD-10-CM | POA: Diagnosis not present

## 2022-10-11 DIAGNOSIS — J449 Chronic obstructive pulmonary disease, unspecified: Secondary | ICD-10-CM | POA: Diagnosis not present

## 2022-10-11 DIAGNOSIS — G8929 Other chronic pain: Secondary | ICD-10-CM | POA: Diagnosis not present

## 2022-10-11 DIAGNOSIS — M25552 Pain in left hip: Secondary | ICD-10-CM | POA: Diagnosis not present

## 2022-10-11 DIAGNOSIS — I509 Heart failure, unspecified: Secondary | ICD-10-CM | POA: Diagnosis not present

## 2022-10-11 DIAGNOSIS — M150 Primary generalized (osteo)arthritis: Secondary | ICD-10-CM | POA: Diagnosis not present

## 2022-10-11 DIAGNOSIS — M810 Age-related osteoporosis without current pathological fracture: Secondary | ICD-10-CM | POA: Diagnosis not present

## 2022-10-11 DIAGNOSIS — E782 Mixed hyperlipidemia: Secondary | ICD-10-CM | POA: Diagnosis not present

## 2022-10-11 DIAGNOSIS — M25551 Pain in right hip: Secondary | ICD-10-CM | POA: Diagnosis not present

## 2022-10-11 DIAGNOSIS — Z85828 Personal history of other malignant neoplasm of skin: Secondary | ICD-10-CM | POA: Diagnosis not present

## 2022-10-11 DIAGNOSIS — M549 Dorsalgia, unspecified: Secondary | ICD-10-CM | POA: Diagnosis not present

## 2022-10-11 DIAGNOSIS — L03116 Cellulitis of left lower limb: Secondary | ICD-10-CM | POA: Diagnosis not present

## 2022-10-11 DIAGNOSIS — I2510 Atherosclerotic heart disease of native coronary artery without angina pectoris: Secondary | ICD-10-CM | POA: Diagnosis not present

## 2022-10-11 DIAGNOSIS — E871 Hypo-osmolality and hyponatremia: Secondary | ICD-10-CM | POA: Diagnosis not present

## 2022-10-11 DIAGNOSIS — Z96649 Presence of unspecified artificial hip joint: Secondary | ICD-10-CM | POA: Diagnosis not present

## 2022-10-11 DIAGNOSIS — E559 Vitamin D deficiency, unspecified: Secondary | ICD-10-CM | POA: Diagnosis not present

## 2022-10-11 DIAGNOSIS — I252 Old myocardial infarction: Secondary | ICD-10-CM | POA: Diagnosis not present

## 2022-10-11 DIAGNOSIS — I110 Hypertensive heart disease with heart failure: Secondary | ICD-10-CM | POA: Diagnosis not present

## 2022-10-11 DIAGNOSIS — I739 Peripheral vascular disease, unspecified: Secondary | ICD-10-CM | POA: Diagnosis not present

## 2022-10-11 DIAGNOSIS — M419 Scoliosis, unspecified: Secondary | ICD-10-CM | POA: Diagnosis not present

## 2022-10-15 DIAGNOSIS — E871 Hypo-osmolality and hyponatremia: Secondary | ICD-10-CM | POA: Diagnosis not present

## 2022-10-15 DIAGNOSIS — L03116 Cellulitis of left lower limb: Secondary | ICD-10-CM | POA: Diagnosis not present

## 2022-10-15 DIAGNOSIS — G8929 Other chronic pain: Secondary | ICD-10-CM | POA: Diagnosis not present

## 2022-10-15 DIAGNOSIS — M810 Age-related osteoporosis without current pathological fracture: Secondary | ICD-10-CM | POA: Diagnosis not present

## 2022-10-15 DIAGNOSIS — E559 Vitamin D deficiency, unspecified: Secondary | ICD-10-CM | POA: Diagnosis not present

## 2022-10-15 DIAGNOSIS — Z85828 Personal history of other malignant neoplasm of skin: Secondary | ICD-10-CM | POA: Diagnosis not present

## 2022-10-15 DIAGNOSIS — I509 Heart failure, unspecified: Secondary | ICD-10-CM | POA: Diagnosis not present

## 2022-10-15 DIAGNOSIS — M549 Dorsalgia, unspecified: Secondary | ICD-10-CM | POA: Diagnosis not present

## 2022-10-15 DIAGNOSIS — M25552 Pain in left hip: Secondary | ICD-10-CM | POA: Diagnosis not present

## 2022-10-15 DIAGNOSIS — I2510 Atherosclerotic heart disease of native coronary artery without angina pectoris: Secondary | ICD-10-CM | POA: Diagnosis not present

## 2022-10-15 DIAGNOSIS — M150 Primary generalized (osteo)arthritis: Secondary | ICD-10-CM | POA: Diagnosis not present

## 2022-10-15 DIAGNOSIS — M25551 Pain in right hip: Secondary | ICD-10-CM | POA: Diagnosis not present

## 2022-10-15 DIAGNOSIS — E782 Mixed hyperlipidemia: Secondary | ICD-10-CM | POA: Diagnosis not present

## 2022-10-15 DIAGNOSIS — J449 Chronic obstructive pulmonary disease, unspecified: Secondary | ICD-10-CM | POA: Diagnosis not present

## 2022-10-15 DIAGNOSIS — M419 Scoliosis, unspecified: Secondary | ICD-10-CM | POA: Diagnosis not present

## 2022-10-15 DIAGNOSIS — I252 Old myocardial infarction: Secondary | ICD-10-CM | POA: Diagnosis not present

## 2022-10-15 DIAGNOSIS — I110 Hypertensive heart disease with heart failure: Secondary | ICD-10-CM | POA: Diagnosis not present

## 2022-10-15 DIAGNOSIS — I739 Peripheral vascular disease, unspecified: Secondary | ICD-10-CM | POA: Diagnosis not present

## 2022-10-15 DIAGNOSIS — Z96649 Presence of unspecified artificial hip joint: Secondary | ICD-10-CM | POA: Diagnosis not present

## 2022-10-16 DIAGNOSIS — E559 Vitamin D deficiency, unspecified: Secondary | ICD-10-CM | POA: Diagnosis not present

## 2022-10-16 DIAGNOSIS — H02402 Unspecified ptosis of left eyelid: Secondary | ICD-10-CM | POA: Diagnosis not present

## 2022-10-16 DIAGNOSIS — J449 Chronic obstructive pulmonary disease, unspecified: Secondary | ICD-10-CM | POA: Diagnosis not present

## 2022-10-16 DIAGNOSIS — E781 Pure hyperglyceridemia: Secondary | ICD-10-CM | POA: Diagnosis not present

## 2022-10-16 DIAGNOSIS — Z0183 Encounter for blood typing: Secondary | ICD-10-CM | POA: Diagnosis not present

## 2022-10-16 DIAGNOSIS — I10 Essential (primary) hypertension: Secondary | ICD-10-CM | POA: Diagnosis not present

## 2022-10-18 DIAGNOSIS — I110 Hypertensive heart disease with heart failure: Secondary | ICD-10-CM | POA: Diagnosis not present

## 2022-10-18 DIAGNOSIS — M549 Dorsalgia, unspecified: Secondary | ICD-10-CM | POA: Diagnosis not present

## 2022-10-18 DIAGNOSIS — Z96649 Presence of unspecified artificial hip joint: Secondary | ICD-10-CM | POA: Diagnosis not present

## 2022-10-18 DIAGNOSIS — E871 Hypo-osmolality and hyponatremia: Secondary | ICD-10-CM | POA: Diagnosis not present

## 2022-10-18 DIAGNOSIS — E782 Mixed hyperlipidemia: Secondary | ICD-10-CM | POA: Diagnosis not present

## 2022-10-18 DIAGNOSIS — I509 Heart failure, unspecified: Secondary | ICD-10-CM | POA: Diagnosis not present

## 2022-10-18 DIAGNOSIS — I739 Peripheral vascular disease, unspecified: Secondary | ICD-10-CM | POA: Diagnosis not present

## 2022-10-18 DIAGNOSIS — M25552 Pain in left hip: Secondary | ICD-10-CM | POA: Diagnosis not present

## 2022-10-18 DIAGNOSIS — M810 Age-related osteoporosis without current pathological fracture: Secondary | ICD-10-CM | POA: Diagnosis not present

## 2022-10-18 DIAGNOSIS — I252 Old myocardial infarction: Secondary | ICD-10-CM | POA: Diagnosis not present

## 2022-10-18 DIAGNOSIS — Z85828 Personal history of other malignant neoplasm of skin: Secondary | ICD-10-CM | POA: Diagnosis not present

## 2022-10-18 DIAGNOSIS — M150 Primary generalized (osteo)arthritis: Secondary | ICD-10-CM | POA: Diagnosis not present

## 2022-10-18 DIAGNOSIS — M25551 Pain in right hip: Secondary | ICD-10-CM | POA: Diagnosis not present

## 2022-10-18 DIAGNOSIS — M419 Scoliosis, unspecified: Secondary | ICD-10-CM | POA: Diagnosis not present

## 2022-10-18 DIAGNOSIS — G8929 Other chronic pain: Secondary | ICD-10-CM | POA: Diagnosis not present

## 2022-10-18 DIAGNOSIS — E559 Vitamin D deficiency, unspecified: Secondary | ICD-10-CM | POA: Diagnosis not present

## 2022-10-18 DIAGNOSIS — L03116 Cellulitis of left lower limb: Secondary | ICD-10-CM | POA: Diagnosis not present

## 2022-10-18 DIAGNOSIS — I2510 Atherosclerotic heart disease of native coronary artery without angina pectoris: Secondary | ICD-10-CM | POA: Diagnosis not present

## 2022-10-18 DIAGNOSIS — J449 Chronic obstructive pulmonary disease, unspecified: Secondary | ICD-10-CM | POA: Diagnosis not present

## 2022-10-22 DIAGNOSIS — M25551 Pain in right hip: Secondary | ICD-10-CM | POA: Diagnosis not present

## 2022-10-22 DIAGNOSIS — I739 Peripheral vascular disease, unspecified: Secondary | ICD-10-CM | POA: Diagnosis not present

## 2022-10-22 DIAGNOSIS — I110 Hypertensive heart disease with heart failure: Secondary | ICD-10-CM | POA: Diagnosis not present

## 2022-10-22 DIAGNOSIS — M419 Scoliosis, unspecified: Secondary | ICD-10-CM | POA: Diagnosis not present

## 2022-10-22 DIAGNOSIS — E782 Mixed hyperlipidemia: Secondary | ICD-10-CM | POA: Diagnosis not present

## 2022-10-22 DIAGNOSIS — Z96649 Presence of unspecified artificial hip joint: Secondary | ICD-10-CM | POA: Diagnosis not present

## 2022-10-22 DIAGNOSIS — E559 Vitamin D deficiency, unspecified: Secondary | ICD-10-CM | POA: Diagnosis not present

## 2022-10-22 DIAGNOSIS — L03116 Cellulitis of left lower limb: Secondary | ICD-10-CM | POA: Diagnosis not present

## 2022-10-22 DIAGNOSIS — I252 Old myocardial infarction: Secondary | ICD-10-CM | POA: Diagnosis not present

## 2022-10-22 DIAGNOSIS — J449 Chronic obstructive pulmonary disease, unspecified: Secondary | ICD-10-CM | POA: Diagnosis not present

## 2022-10-22 DIAGNOSIS — M150 Primary generalized (osteo)arthritis: Secondary | ICD-10-CM | POA: Diagnosis not present

## 2022-10-22 DIAGNOSIS — I2510 Atherosclerotic heart disease of native coronary artery without angina pectoris: Secondary | ICD-10-CM | POA: Diagnosis not present

## 2022-10-22 DIAGNOSIS — I509 Heart failure, unspecified: Secondary | ICD-10-CM | POA: Diagnosis not present

## 2022-10-22 DIAGNOSIS — G8929 Other chronic pain: Secondary | ICD-10-CM | POA: Diagnosis not present

## 2022-10-22 DIAGNOSIS — E871 Hypo-osmolality and hyponatremia: Secondary | ICD-10-CM | POA: Diagnosis not present

## 2022-10-22 DIAGNOSIS — M25552 Pain in left hip: Secondary | ICD-10-CM | POA: Diagnosis not present

## 2022-10-22 DIAGNOSIS — M549 Dorsalgia, unspecified: Secondary | ICD-10-CM | POA: Diagnosis not present

## 2022-10-22 DIAGNOSIS — M810 Age-related osteoporosis without current pathological fracture: Secondary | ICD-10-CM | POA: Diagnosis not present

## 2022-10-22 DIAGNOSIS — Z85828 Personal history of other malignant neoplasm of skin: Secondary | ICD-10-CM | POA: Diagnosis not present

## 2022-10-25 DIAGNOSIS — E559 Vitamin D deficiency, unspecified: Secondary | ICD-10-CM | POA: Diagnosis not present

## 2022-10-25 DIAGNOSIS — I509 Heart failure, unspecified: Secondary | ICD-10-CM | POA: Diagnosis not present

## 2022-10-25 DIAGNOSIS — M419 Scoliosis, unspecified: Secondary | ICD-10-CM | POA: Diagnosis not present

## 2022-10-25 DIAGNOSIS — I252 Old myocardial infarction: Secondary | ICD-10-CM | POA: Diagnosis not present

## 2022-10-25 DIAGNOSIS — J449 Chronic obstructive pulmonary disease, unspecified: Secondary | ICD-10-CM | POA: Diagnosis not present

## 2022-10-25 DIAGNOSIS — I110 Hypertensive heart disease with heart failure: Secondary | ICD-10-CM | POA: Diagnosis not present

## 2022-10-25 DIAGNOSIS — I2510 Atherosclerotic heart disease of native coronary artery without angina pectoris: Secondary | ICD-10-CM | POA: Diagnosis not present

## 2022-10-25 DIAGNOSIS — M150 Primary generalized (osteo)arthritis: Secondary | ICD-10-CM | POA: Diagnosis not present

## 2022-10-25 DIAGNOSIS — Z96649 Presence of unspecified artificial hip joint: Secondary | ICD-10-CM | POA: Diagnosis not present

## 2022-10-25 DIAGNOSIS — Z85828 Personal history of other malignant neoplasm of skin: Secondary | ICD-10-CM | POA: Diagnosis not present

## 2022-10-25 DIAGNOSIS — L03116 Cellulitis of left lower limb: Secondary | ICD-10-CM | POA: Diagnosis not present

## 2022-10-25 DIAGNOSIS — G8929 Other chronic pain: Secondary | ICD-10-CM | POA: Diagnosis not present

## 2022-10-25 DIAGNOSIS — M25551 Pain in right hip: Secondary | ICD-10-CM | POA: Diagnosis not present

## 2022-10-25 DIAGNOSIS — E871 Hypo-osmolality and hyponatremia: Secondary | ICD-10-CM | POA: Diagnosis not present

## 2022-10-25 DIAGNOSIS — I739 Peripheral vascular disease, unspecified: Secondary | ICD-10-CM | POA: Diagnosis not present

## 2022-10-25 DIAGNOSIS — M25552 Pain in left hip: Secondary | ICD-10-CM | POA: Diagnosis not present

## 2022-10-25 DIAGNOSIS — M549 Dorsalgia, unspecified: Secondary | ICD-10-CM | POA: Diagnosis not present

## 2022-10-25 DIAGNOSIS — E782 Mixed hyperlipidemia: Secondary | ICD-10-CM | POA: Diagnosis not present

## 2022-10-25 DIAGNOSIS — M810 Age-related osteoporosis without current pathological fracture: Secondary | ICD-10-CM | POA: Diagnosis not present

## 2022-10-29 DIAGNOSIS — I739 Peripheral vascular disease, unspecified: Secondary | ICD-10-CM | POA: Diagnosis not present

## 2022-10-29 DIAGNOSIS — Z96649 Presence of unspecified artificial hip joint: Secondary | ICD-10-CM | POA: Diagnosis not present

## 2022-10-29 DIAGNOSIS — M810 Age-related osteoporosis without current pathological fracture: Secondary | ICD-10-CM | POA: Diagnosis not present

## 2022-10-29 DIAGNOSIS — M25551 Pain in right hip: Secondary | ICD-10-CM | POA: Diagnosis not present

## 2022-10-29 DIAGNOSIS — I2510 Atherosclerotic heart disease of native coronary artery without angina pectoris: Secondary | ICD-10-CM | POA: Diagnosis not present

## 2022-10-29 DIAGNOSIS — E782 Mixed hyperlipidemia: Secondary | ICD-10-CM | POA: Diagnosis not present

## 2022-10-29 DIAGNOSIS — I110 Hypertensive heart disease with heart failure: Secondary | ICD-10-CM | POA: Diagnosis not present

## 2022-10-29 DIAGNOSIS — M25552 Pain in left hip: Secondary | ICD-10-CM | POA: Diagnosis not present

## 2022-10-29 DIAGNOSIS — I252 Old myocardial infarction: Secondary | ICD-10-CM | POA: Diagnosis not present

## 2022-10-29 DIAGNOSIS — E871 Hypo-osmolality and hyponatremia: Secondary | ICD-10-CM | POA: Diagnosis not present

## 2022-10-29 DIAGNOSIS — M549 Dorsalgia, unspecified: Secondary | ICD-10-CM | POA: Diagnosis not present

## 2022-10-29 DIAGNOSIS — M419 Scoliosis, unspecified: Secondary | ICD-10-CM | POA: Diagnosis not present

## 2022-10-29 DIAGNOSIS — M150 Primary generalized (osteo)arthritis: Secondary | ICD-10-CM | POA: Diagnosis not present

## 2022-10-29 DIAGNOSIS — Z85828 Personal history of other malignant neoplasm of skin: Secondary | ICD-10-CM | POA: Diagnosis not present

## 2022-10-29 DIAGNOSIS — L03116 Cellulitis of left lower limb: Secondary | ICD-10-CM | POA: Diagnosis not present

## 2022-10-29 DIAGNOSIS — I509 Heart failure, unspecified: Secondary | ICD-10-CM | POA: Diagnosis not present

## 2022-10-29 DIAGNOSIS — J449 Chronic obstructive pulmonary disease, unspecified: Secondary | ICD-10-CM | POA: Diagnosis not present

## 2022-10-29 DIAGNOSIS — G8929 Other chronic pain: Secondary | ICD-10-CM | POA: Diagnosis not present

## 2022-10-29 DIAGNOSIS — E559 Vitamin D deficiency, unspecified: Secondary | ICD-10-CM | POA: Diagnosis not present

## 2022-10-31 DIAGNOSIS — M25551 Pain in right hip: Secondary | ICD-10-CM | POA: Diagnosis not present

## 2022-10-31 DIAGNOSIS — L03116 Cellulitis of left lower limb: Secondary | ICD-10-CM | POA: Diagnosis not present

## 2022-10-31 DIAGNOSIS — J449 Chronic obstructive pulmonary disease, unspecified: Secondary | ICD-10-CM | POA: Diagnosis not present

## 2022-10-31 DIAGNOSIS — E782 Mixed hyperlipidemia: Secondary | ICD-10-CM | POA: Diagnosis not present

## 2022-10-31 DIAGNOSIS — M150 Primary generalized (osteo)arthritis: Secondary | ICD-10-CM | POA: Diagnosis not present

## 2022-10-31 DIAGNOSIS — Z85828 Personal history of other malignant neoplasm of skin: Secondary | ICD-10-CM | POA: Diagnosis not present

## 2022-10-31 DIAGNOSIS — M810 Age-related osteoporosis without current pathological fracture: Secondary | ICD-10-CM | POA: Diagnosis not present

## 2022-10-31 DIAGNOSIS — G8929 Other chronic pain: Secondary | ICD-10-CM | POA: Diagnosis not present

## 2022-10-31 DIAGNOSIS — Z96649 Presence of unspecified artificial hip joint: Secondary | ICD-10-CM | POA: Diagnosis not present

## 2022-10-31 DIAGNOSIS — M25552 Pain in left hip: Secondary | ICD-10-CM | POA: Diagnosis not present

## 2022-10-31 DIAGNOSIS — M549 Dorsalgia, unspecified: Secondary | ICD-10-CM | POA: Diagnosis not present

## 2022-10-31 DIAGNOSIS — M419 Scoliosis, unspecified: Secondary | ICD-10-CM | POA: Diagnosis not present

## 2022-10-31 DIAGNOSIS — E559 Vitamin D deficiency, unspecified: Secondary | ICD-10-CM | POA: Diagnosis not present

## 2022-10-31 DIAGNOSIS — I252 Old myocardial infarction: Secondary | ICD-10-CM | POA: Diagnosis not present

## 2022-10-31 DIAGNOSIS — I739 Peripheral vascular disease, unspecified: Secondary | ICD-10-CM | POA: Diagnosis not present

## 2022-10-31 DIAGNOSIS — I110 Hypertensive heart disease with heart failure: Secondary | ICD-10-CM | POA: Diagnosis not present

## 2022-10-31 DIAGNOSIS — I2510 Atherosclerotic heart disease of native coronary artery without angina pectoris: Secondary | ICD-10-CM | POA: Diagnosis not present

## 2022-10-31 DIAGNOSIS — E871 Hypo-osmolality and hyponatremia: Secondary | ICD-10-CM | POA: Diagnosis not present

## 2022-10-31 DIAGNOSIS — I509 Heart failure, unspecified: Secondary | ICD-10-CM | POA: Diagnosis not present

## 2022-11-05 DIAGNOSIS — M419 Scoliosis, unspecified: Secondary | ICD-10-CM | POA: Diagnosis not present

## 2022-11-05 DIAGNOSIS — I110 Hypertensive heart disease with heart failure: Secondary | ICD-10-CM | POA: Diagnosis not present

## 2022-11-05 DIAGNOSIS — M150 Primary generalized (osteo)arthritis: Secondary | ICD-10-CM | POA: Diagnosis not present

## 2022-11-05 DIAGNOSIS — I739 Peripheral vascular disease, unspecified: Secondary | ICD-10-CM | POA: Diagnosis not present

## 2022-11-05 DIAGNOSIS — I509 Heart failure, unspecified: Secondary | ICD-10-CM | POA: Diagnosis not present

## 2022-11-05 DIAGNOSIS — M810 Age-related osteoporosis without current pathological fracture: Secondary | ICD-10-CM | POA: Diagnosis not present

## 2022-11-05 DIAGNOSIS — Z96649 Presence of unspecified artificial hip joint: Secondary | ICD-10-CM | POA: Diagnosis not present

## 2022-11-05 DIAGNOSIS — L03116 Cellulitis of left lower limb: Secondary | ICD-10-CM | POA: Diagnosis not present

## 2022-11-05 DIAGNOSIS — M25551 Pain in right hip: Secondary | ICD-10-CM | POA: Diagnosis not present

## 2022-11-05 DIAGNOSIS — I2510 Atherosclerotic heart disease of native coronary artery without angina pectoris: Secondary | ICD-10-CM | POA: Diagnosis not present

## 2022-11-05 DIAGNOSIS — M25552 Pain in left hip: Secondary | ICD-10-CM | POA: Diagnosis not present

## 2022-11-05 DIAGNOSIS — J449 Chronic obstructive pulmonary disease, unspecified: Secondary | ICD-10-CM | POA: Diagnosis not present

## 2022-11-05 DIAGNOSIS — I252 Old myocardial infarction: Secondary | ICD-10-CM | POA: Diagnosis not present

## 2022-11-05 DIAGNOSIS — Z85828 Personal history of other malignant neoplasm of skin: Secondary | ICD-10-CM | POA: Diagnosis not present

## 2022-11-05 DIAGNOSIS — G8929 Other chronic pain: Secondary | ICD-10-CM | POA: Diagnosis not present

## 2022-11-05 DIAGNOSIS — E871 Hypo-osmolality and hyponatremia: Secondary | ICD-10-CM | POA: Diagnosis not present

## 2022-11-05 DIAGNOSIS — M549 Dorsalgia, unspecified: Secondary | ICD-10-CM | POA: Diagnosis not present

## 2022-11-05 DIAGNOSIS — E559 Vitamin D deficiency, unspecified: Secondary | ICD-10-CM | POA: Diagnosis not present

## 2022-11-05 DIAGNOSIS — E782 Mixed hyperlipidemia: Secondary | ICD-10-CM | POA: Diagnosis not present

## 2022-11-07 DIAGNOSIS — M25552 Pain in left hip: Secondary | ICD-10-CM | POA: Diagnosis not present

## 2022-11-07 DIAGNOSIS — M25551 Pain in right hip: Secondary | ICD-10-CM | POA: Diagnosis not present

## 2022-11-07 DIAGNOSIS — I739 Peripheral vascular disease, unspecified: Secondary | ICD-10-CM | POA: Diagnosis not present

## 2022-11-07 DIAGNOSIS — M419 Scoliosis, unspecified: Secondary | ICD-10-CM | POA: Diagnosis not present

## 2022-11-07 DIAGNOSIS — M810 Age-related osteoporosis without current pathological fracture: Secondary | ICD-10-CM | POA: Diagnosis not present

## 2022-11-07 DIAGNOSIS — M549 Dorsalgia, unspecified: Secondary | ICD-10-CM | POA: Diagnosis not present

## 2022-11-07 DIAGNOSIS — L03116 Cellulitis of left lower limb: Secondary | ICD-10-CM | POA: Diagnosis not present

## 2022-11-07 DIAGNOSIS — I509 Heart failure, unspecified: Secondary | ICD-10-CM | POA: Diagnosis not present

## 2022-11-07 DIAGNOSIS — Z85828 Personal history of other malignant neoplasm of skin: Secondary | ICD-10-CM | POA: Diagnosis not present

## 2022-11-07 DIAGNOSIS — I2510 Atherosclerotic heart disease of native coronary artery without angina pectoris: Secondary | ICD-10-CM | POA: Diagnosis not present

## 2022-11-07 DIAGNOSIS — M150 Primary generalized (osteo)arthritis: Secondary | ICD-10-CM | POA: Diagnosis not present

## 2022-11-07 DIAGNOSIS — Z96649 Presence of unspecified artificial hip joint: Secondary | ICD-10-CM | POA: Diagnosis not present

## 2022-11-07 DIAGNOSIS — E871 Hypo-osmolality and hyponatremia: Secondary | ICD-10-CM | POA: Diagnosis not present

## 2022-11-07 DIAGNOSIS — J449 Chronic obstructive pulmonary disease, unspecified: Secondary | ICD-10-CM | POA: Diagnosis not present

## 2022-11-07 DIAGNOSIS — E559 Vitamin D deficiency, unspecified: Secondary | ICD-10-CM | POA: Diagnosis not present

## 2022-11-07 DIAGNOSIS — I252 Old myocardial infarction: Secondary | ICD-10-CM | POA: Diagnosis not present

## 2022-11-07 DIAGNOSIS — E782 Mixed hyperlipidemia: Secondary | ICD-10-CM | POA: Diagnosis not present

## 2022-11-07 DIAGNOSIS — G8929 Other chronic pain: Secondary | ICD-10-CM | POA: Diagnosis not present

## 2022-11-07 DIAGNOSIS — I110 Hypertensive heart disease with heart failure: Secondary | ICD-10-CM | POA: Diagnosis not present

## 2022-11-13 NOTE — Telephone Encounter (Signed)
Attempted to call, no dpr on file. Unable to leave detailed message.

## 2022-11-14 NOTE — Telephone Encounter (Signed)
Pt confirmed that she has changed to a new cardiologist, but could not remember the dr's name. Pt states that she has appt this week with her PCP to discuss med refill.

## 2022-11-20 DIAGNOSIS — I10 Essential (primary) hypertension: Secondary | ICD-10-CM | POA: Diagnosis not present

## 2022-11-20 DIAGNOSIS — E871 Hypo-osmolality and hyponatremia: Secondary | ICD-10-CM | POA: Diagnosis not present

## 2022-11-20 DIAGNOSIS — E782 Mixed hyperlipidemia: Secondary | ICD-10-CM | POA: Diagnosis not present

## 2022-11-20 DIAGNOSIS — E559 Vitamin D deficiency, unspecified: Secondary | ICD-10-CM | POA: Diagnosis not present

## 2022-11-20 DIAGNOSIS — J449 Chronic obstructive pulmonary disease, unspecified: Secondary | ICD-10-CM | POA: Diagnosis not present

## 2022-11-20 DIAGNOSIS — M2550 Pain in unspecified joint: Secondary | ICD-10-CM | POA: Diagnosis not present

## 2022-11-20 DIAGNOSIS — Z0183 Encounter for blood typing: Secondary | ICD-10-CM | POA: Diagnosis not present

## 2022-11-21 ENCOUNTER — Other Ambulatory Visit: Payer: Self-pay | Admitting: Cardiovascular Disease

## 2022-11-21 DIAGNOSIS — I25118 Atherosclerotic heart disease of native coronary artery with other forms of angina pectoris: Secondary | ICD-10-CM

## 2022-11-21 DIAGNOSIS — I10 Essential (primary) hypertension: Secondary | ICD-10-CM

## 2022-11-21 NOTE — Telephone Encounter (Signed)
Patient of Dr. Fletcher Anon. Last ov in 2021, needs appt. Please review for refill. Thank you!

## 2022-12-09 DIAGNOSIS — R109 Unspecified abdominal pain: Secondary | ICD-10-CM | POA: Diagnosis not present

## 2022-12-09 DIAGNOSIS — R1030 Lower abdominal pain, unspecified: Secondary | ICD-10-CM | POA: Diagnosis not present

## 2022-12-10 DIAGNOSIS — E782 Mixed hyperlipidemia: Secondary | ICD-10-CM | POA: Diagnosis not present

## 2022-12-10 DIAGNOSIS — J449 Chronic obstructive pulmonary disease, unspecified: Secondary | ICD-10-CM | POA: Diagnosis not present

## 2022-12-10 DIAGNOSIS — I10 Essential (primary) hypertension: Secondary | ICD-10-CM | POA: Diagnosis not present

## 2022-12-21 ENCOUNTER — Other Ambulatory Visit: Payer: Self-pay | Admitting: Cardiovascular Disease

## 2022-12-21 DIAGNOSIS — I25118 Atherosclerotic heart disease of native coronary artery with other forms of angina pectoris: Secondary | ICD-10-CM

## 2022-12-21 DIAGNOSIS — I10 Essential (primary) hypertension: Secondary | ICD-10-CM

## 2022-12-23 ENCOUNTER — Other Ambulatory Visit: Payer: Self-pay

## 2022-12-23 MED ORDER — OXYCODONE-ACETAMINOPHEN 10-325 MG PO TABS: 1.0000 | ORAL_TABLET | Freq: Four times a day (QID) | ORAL | 0 refills | Status: DC | PRN

## 2022-12-23 NOTE — Telephone Encounter (Signed)
Please contact pt for future appointment. Pt overdue f/u needing refills.

## 2022-12-25 ENCOUNTER — Encounter: Payer: Self-pay | Admitting: Family

## 2022-12-25 ENCOUNTER — Ambulatory Visit (INDEPENDENT_AMBULATORY_CARE_PROVIDER_SITE_OTHER): Payer: 59 | Admitting: Family

## 2022-12-25 VITALS — BP 130/70 | HR 84 | Ht 66.0 in | Wt 112.0 lb

## 2022-12-25 DIAGNOSIS — M5442 Lumbago with sciatica, left side: Secondary | ICD-10-CM | POA: Diagnosis not present

## 2022-12-25 DIAGNOSIS — M4120 Other idiopathic scoliosis, site unspecified: Secondary | ICD-10-CM

## 2022-12-25 DIAGNOSIS — M5137 Other intervertebral disc degeneration, lumbosacral region: Secondary | ICD-10-CM | POA: Diagnosis not present

## 2022-12-25 DIAGNOSIS — G8929 Other chronic pain: Secondary | ICD-10-CM | POA: Diagnosis not present

## 2022-12-25 DIAGNOSIS — S91301A Unspecified open wound, right foot, initial encounter: Secondary | ICD-10-CM

## 2022-12-25 DIAGNOSIS — M5441 Lumbago with sciatica, right side: Secondary | ICD-10-CM | POA: Diagnosis not present

## 2022-12-25 DIAGNOSIS — G894 Chronic pain syndrome: Secondary | ICD-10-CM | POA: Diagnosis not present

## 2022-12-25 DIAGNOSIS — E538 Deficiency of other specified B group vitamins: Secondary | ICD-10-CM | POA: Diagnosis not present

## 2022-12-25 DIAGNOSIS — S91302A Unspecified open wound, left foot, initial encounter: Secondary | ICD-10-CM

## 2022-12-25 MED ORDER — CYANOCOBALAMIN 1000 MCG/ML IJ SOLN
1000.0000 ug | Freq: Once | INTRAMUSCULAR | Status: AC
  Administered 2022-12-25: 1000 ug via INTRAMUSCULAR

## 2022-12-25 NOTE — Progress Notes (Unsigned)
Established Patient Office Visit  Subjective:  Patient ID: [name] [name], female    DOB: May 03, 1935  Age: 88 y.o. MRN: [id number]  Chief Complaint  Patient presents with   Follow-up    1 month follow up    HPI   Past Medical History:  Diagnosis Date   Atrial fibrillation ([facility])    Basal cell carcinoma 2021   L temple   Chronic back pain    Congestive heart failure (CHF) (HCC)    Hypertension    Scoliosis     Social History   Socioeconomic History   Marital status: Single    Spouse name: Not on file   Number of children: 0   Years of education: Not on file   Highest education level: Not on file  Occupational History   Not on file  Tobacco Use   Smoking status: Former    Packs/day: 0.50    Years: 68.00    Total pack years: 34.00    Types: Cigarettes    Quit date: 06/11/2020    Years since quitting: 2.5   Smokeless tobacco: Never  Substance and Sexual Activity   Alcohol use: Not Currently   Drug use: Not Currently   Sexual activity: Not on file  Other Topics Concern   Not on file  Social History Narrative   Not on file   Social Determinants of Health   Financial Resource Strain: Not on file  Food Insecurity: Not on file  Transportation Needs: No Transportation Needs (12/25/2022)   PRAPARE - Hydrologist (Medical): No    Lack of Transportation (Non-Medical): No  Physical Activity: Insufficiently Active (12/25/2022)   Exercise Vital Sign    Days of Exercise per Week: 7 days    Minutes of Exercise per Session: 20 min  Stress: Stress Concern Present (12/25/2022)   [place]    Feeling of Stress : Very much  Social Connections: Socially Isolated (12/25/2022)   Social Connection and Isolation Panel [NHANES]    Frequency of Communication with Friends and Family: Never    Frequency of Social Gatherings with Friends and Family: Never    Attends Religious Services:  Never    Marine scientist or Organizations: No    Attends Archivist Meetings: Never    Marital Status: Never married  Intimate Partner Violence: Not At Risk (12/25/2022)   Humiliation, Afraid, Rape, and Kick questionnaire    Fear of Current or Ex-Partner: No    Emotionally Abused: No    Physically Abused: No    Sexually Abused: No    Family History  Problem Relation Age of Onset   CVA Father    Heart disease Father    Arthritis/Rheumatoid Sister     Allergies  Allergen Reactions   Sulfa Antibiotics Other (See Comments)    Unknown, happened as a child    ROS     Objective:   BP 130/70   Pulse 84   Ht 5' 6"$  (1.676 m)   Wt 112 lb (50.8 kg)   SpO2 96%   BMI 18.08 kg/m   Vitals:   12/25/22 0945  BP: 130/70  Pulse: 84  Height: 5' 6"$  (1.676 m)  Weight: 112 lb (50.8 kg)  SpO2: 96%  BMI (Calculated): 18.09    Physical Exam   No results found for any visits on 12/25/22.  No results found for this or any previous visit (from the  past 2160 hour(s)).    Assessment & Plan:   Problem List Items Addressed This Visit   None Visit Diagnoses     Wound, open, foot, left, initial encounter    -  Primary   Relevant Orders   Ambulatory referral to Podiatry   Wound, open, foot, right, initial encounter       Relevant Orders   Ambulatory referral to Podiatry   B12 nutritional deficiency       Relevant Medications   cyanocobalamin (VITAMIN B12) injection 1,000 mcg (Start on 12/25/2022 11:00 AM)       Return in about 1 month (around 01/23/2023).   Total time spent: {AMA time spent:29001} minutes  Mechele Claude, FNP  12/25/2022

## 2022-12-26 NOTE — Assessment & Plan Note (Signed)
Will send refills for her medications Current dosing is working well for her, and she says that she is well controlled with current regimen.

## 2023-01-05 DIAGNOSIS — I25110 Atherosclerotic heart disease of native coronary artery with unstable angina pectoris: Secondary | ICD-10-CM | POA: Insufficient documentation

## 2023-01-05 DIAGNOSIS — I25118 Atherosclerotic heart disease of native coronary artery with other forms of angina pectoris: Secondary | ICD-10-CM | POA: Insufficient documentation

## 2023-01-07 ENCOUNTER — Telehealth: Payer: Self-pay

## 2023-01-07 NOTE — Telephone Encounter (Signed)
Pt called and left vm regarding that she is retaining  a lot of fluid on right leg she asked what to do regarding this? Please advise

## 2023-01-08 DIAGNOSIS — H02403 Unspecified ptosis of bilateral eyelids: Secondary | ICD-10-CM | POA: Diagnosis not present

## 2023-01-08 NOTE — Telephone Encounter (Signed)
Tried calling patient to make appt left message to call us back

## 2023-01-12 DIAGNOSIS — I509 Heart failure, unspecified: Secondary | ICD-10-CM | POA: Insufficient documentation

## 2023-01-12 DIAGNOSIS — I5022 Chronic systolic (congestive) heart failure: Secondary | ICD-10-CM | POA: Insufficient documentation

## 2023-01-14 DIAGNOSIS — H02831 Dermatochalasis of right upper eyelid: Secondary | ICD-10-CM | POA: Diagnosis not present

## 2023-01-14 DIAGNOSIS — L578 Other skin changes due to chronic exposure to nonionizing radiation: Secondary | ICD-10-CM | POA: Diagnosis not present

## 2023-01-14 DIAGNOSIS — H02413 Mechanical ptosis of bilateral eyelids: Secondary | ICD-10-CM | POA: Diagnosis not present

## 2023-01-23 ENCOUNTER — Other Ambulatory Visit: Payer: Self-pay | Admitting: Cardiovascular Disease

## 2023-01-23 ENCOUNTER — Other Ambulatory Visit: Payer: Self-pay

## 2023-01-23 DIAGNOSIS — I10 Essential (primary) hypertension: Secondary | ICD-10-CM

## 2023-01-23 DIAGNOSIS — I25118 Atherosclerotic heart disease of native coronary artery with other forms of angina pectoris: Secondary | ICD-10-CM

## 2023-01-23 MED ORDER — OXYCODONE-ACETAMINOPHEN 10-325 MG PO TABS: 1.0000 | ORAL_TABLET | Freq: Four times a day (QID) | ORAL | 0 refills | Status: DC | PRN

## 2023-01-29 ENCOUNTER — Ambulatory Visit (INDEPENDENT_AMBULATORY_CARE_PROVIDER_SITE_OTHER): Payer: 59 | Admitting: Family

## 2023-01-29 VITALS — BP 124/62 | HR 67 | Ht 64.0 in | Wt 111.0 lb

## 2023-01-29 DIAGNOSIS — S91302A Unspecified open wound, left foot, initial encounter: Secondary | ICD-10-CM

## 2023-01-29 DIAGNOSIS — I25118 Atherosclerotic heart disease of native coronary artery with other forms of angina pectoris: Secondary | ICD-10-CM

## 2023-01-29 DIAGNOSIS — S81801S Unspecified open wound, right lower leg, sequela: Secondary | ICD-10-CM

## 2023-01-29 DIAGNOSIS — G909 Disorder of the autonomic nervous system, unspecified: Secondary | ICD-10-CM

## 2023-01-29 DIAGNOSIS — S81802S Unspecified open wound, left lower leg, sequela: Secondary | ICD-10-CM

## 2023-01-29 DIAGNOSIS — S91301A Unspecified open wound, right foot, initial encounter: Secondary | ICD-10-CM

## 2023-01-29 MED ORDER — OXYCODONE-ACETAMINOPHEN 10-325 MG PO TABS: 1.0000 | ORAL_TABLET | Freq: Four times a day (QID) | ORAL | 0 refills | Status: DC | PRN

## 2023-01-29 NOTE — Progress Notes (Addendum)
Established Patient Office Visit  Subjective:  Patient ID: [name] [name], female    DOB: 16-Oct-1935  Age: 88 y.o. MRN: [id number]  Chief Complaint  Patient presents with   Follow-up    1 month follow up    Patient is here today for her 1 month follow up.  She has been feeling fairly well since last appointment.   She does have additional concerns to discuss today.  She is still having issues with the wounds on her legs, they are open and weeping.  These are chronic wounds, and they had improved greatly with her previous home health, but she was unable to continue as the company stopped all business and she ended up getting worse again.  They are on bilateral shins/ankles, appear to be venous stasis ulcers.   She needs refills.   I have reviewed her active problem list, medication list, allergies, notes from last encounter, lab results for her appointment today.   No other concerns at this time.   Past Medical History:  Diagnosis Date   Atrial fibrillation ([place])    Basal cell carcinoma 2021   L temple   Chronic back pain    Congestive heart failure (CHF) ([place])    History of total hip replacement (Bilateral) 01/12/2020   Left hip replacement done in 2019.  Right hip replacement done in 2012.   Hypertension    Hyponatremia 06/11/2020   NSTEMI (non-ST elevated myocardial infarction) ([place]) 06/11/2020   Pharmacologic therapy 01/12/2020   Problems influencing health status 01/12/2020   Scoliosis     Past Surgical History:  Procedure Laterality Date   ABDOMINAL SURGERY     bilateral hip replacement     CORONARY STENT INTERVENTION N/A 06/12/2020   Procedure: CORONARY STENT INTERVENTION;  Surgeon: Wellington Hampshire, MD;  Location: [facility];  Service: Cardiovascular;  Laterality: N/A;  LAD   LEFT HEART CATH AND CORONARY ANGIOGRAPHY N/A 06/12/2020   Procedure: LEFT HEART CATH AND CORONARY ANGIOGRAPHY;  Surgeon: Wellington Hampshire, MD;  Location: [facility];   Service: Cardiovascular;  Laterality: N/A;   TONSILLECTOMY      Social History   Socioeconomic History   Marital status: Single    Spouse name: Not on file   Number of children: 0   Years of education: Not on file   Highest education level: Not on file  Occupational History   Not on file  Tobacco Use   Smoking status: Former    Packs/day: 0.50    Years: 68.00    Additional pack years: 0.00    Total pack years: 34.00    Types: Cigarettes    Quit date: 06/11/2020    Years since quitting: 2.6   Smokeless tobacco: Never  Substance and Sexual Activity   Alcohol use: Not Currently   Drug use: Not Currently   Sexual activity: Not on file  Other Topics Concern   Not on file  Social History Narrative   Not on file   Social Determinants of Health   Financial Resource Strain: Not on file  Food Insecurity: Not on file  Transportation Needs: No Transportation Needs (12/25/2022)   PRAPARE - Hydrologist (Medical): No    Lack of Transportation (Non-Medical): No  Physical Activity: Insufficiently Active (12/25/2022)   Exercise Vital Sign    Days of Exercise per Week: 7 days    Minutes of Exercise per Session: 20 min  Stress: Stress Concern Present (12/25/2022)  Shannon Questionnaire    Feeling of Stress : Very much  Social Connections: Socially Isolated (12/25/2022)   Social Connection and Isolation Panel [NHANES]    Frequency of Communication with Friends and Family: Never    Frequency of Social Gatherings with Friends and Family: Never    Attends Religious Services: Never    Marine scientist or Organizations: No    Attends Archivist Meetings: Never    Marital Status: Never married  Intimate Partner Violence: Not At Risk (12/25/2022)   Humiliation, Afraid, Rape, and Kick questionnaire    Fear of Current or Ex-Partner: No    Emotionally Abused: No    Physically Abused: No     Sexually Abused: No    Family History  Problem Relation Age of Onset   CVA Father    Heart disease Father    Arthritis/Rheumatoid Sister     Allergies  Allergen Reactions   Spironolactone Anaphylaxis   Fluogen [Influenza Virus Vaccine] Hives   Statins     Dizziness & weakness   Sulfa Antibiotics Other (See Comments)    Unknown, happened as a child    Review of Systems  Skin:        Wounds on bilateral legs.   All other systems reviewed and are negative.      Objective:   BP 124/62   Pulse 67   Ht 5\' 4"  (1.626 m)   Wt 111 lb (50.3 kg)   SpO2 99%   BMI 19.05 kg/m   Vitals:   01/29/23 1007  BP: 124/62  Pulse: 67  Height: 5\' 4"  (1.626 m)  Weight: 111 lb (50.3 kg)  SpO2: 99%  BMI (Calculated): 19.04    Physical Exam Vitals and nursing note reviewed.  Constitutional:      Appearance: Normal appearance. She is normal weight.  HENT:     Head: Normocephalic and atraumatic.  Eyes:     Extraocular Movements: Extraocular movements intact.     Conjunctiva/sclera: Conjunctivae normal.     Pupils: Pupils are equal, round, and reactive to light.  Cardiovascular:     Rate and Rhythm: Normal rate.  Pulmonary:     Effort: Pulmonary effort is normal.  Skin:    Coloration: Skin is mottled (on legs).     Findings: Wound present.       Neurological:     Mental Status: She is alert.      No results found for any visits on 01/29/23.  No results found for this or any previous visit (from the past 2160 hour(s)).    Assessment & Plan:   Problem List Items Addressed This Visit     Peripheral autonomic neuropathy - Primary (Chronic)   Coronary artery disease of native artery of native heart with stable angina pectoris   Relevant Medications   meloxicam (MOBIC) 15 MG tablet   metoprolol succinate (TOPROL-XL) 25 MG 24 hr tablet   oxyCODONE-acetaminophen (PERCOCET) 10-325 MG tablet (Start on 02/22/2023)   Other Visit Diagnoses     Wound, open, foot, left,  initial encounter       Will see if we are able to get her set up with home health to care for her wounds.   Wound, open, foot, right, initial encounter       Will see if we can get her set up with home health to care for these.   Wound, open, leg, left, sequela  Will see if I can find a home health company to take the patient and help care for her wound.   Wound, open, leg, right, sequela           Return in about 1 month (around 03/01/2023) for F/U.   Total time spent: 30 minutes  Mechele Claude, FNP  01/29/2023

## 2023-01-30 ENCOUNTER — Encounter: Payer: Self-pay | Admitting: Family

## 2023-02-04 ENCOUNTER — Other Ambulatory Visit: Payer: Self-pay | Admitting: Family

## 2023-02-04 DIAGNOSIS — I25118 Atherosclerotic heart disease of native coronary artery with other forms of angina pectoris: Secondary | ICD-10-CM

## 2023-02-04 DIAGNOSIS — I872 Venous insufficiency (chronic) (peripheral): Secondary | ICD-10-CM | POA: Insufficient documentation

## 2023-02-04 DIAGNOSIS — I10 Essential (primary) hypertension: Secondary | ICD-10-CM

## 2023-02-05 ENCOUNTER — Telehealth: Payer: Self-pay

## 2023-02-05 ENCOUNTER — Other Ambulatory Visit: Payer: Self-pay

## 2023-02-05 ENCOUNTER — Telehealth (HOSPITAL_BASED_OUTPATIENT_CLINIC_OR_DEPARTMENT_OTHER): Payer: Self-pay

## 2023-02-05 DIAGNOSIS — I25118 Atherosclerotic heart disease of native coronary artery with other forms of angina pectoris: Secondary | ICD-10-CM | POA: Insufficient documentation

## 2023-02-05 DIAGNOSIS — S91302A Unspecified open wound, left foot, initial encounter: Secondary | ICD-10-CM

## 2023-02-05 DIAGNOSIS — S91301A Unspecified open wound, right foot, initial encounter: Secondary | ICD-10-CM

## 2023-02-05 NOTE — Telephone Encounter (Signed)
Pt lm asking for a nurse to get sent out to her home ASAP

## 2023-02-05 NOTE — Addendum Note (Signed)
Addended by: Georgian Co on: 02/05/2023 03:24 PM   Modules accepted: Orders

## 2023-02-05 NOTE — Telephone Encounter (Signed)
error 

## 2023-02-09 DIAGNOSIS — I10 Essential (primary) hypertension: Secondary | ICD-10-CM | POA: Diagnosis not present

## 2023-02-09 DIAGNOSIS — L03115 Cellulitis of right lower limb: Secondary | ICD-10-CM | POA: Diagnosis not present

## 2023-02-09 DIAGNOSIS — I890 Lymphedema, not elsewhere classified: Secondary | ICD-10-CM | POA: Diagnosis not present

## 2023-02-11 NOTE — Addendum Note (Signed)
Addended by: Georgian Co on: 02/11/2023 09:48 PM   Modules accepted: Orders

## 2023-02-12 ENCOUNTER — Telehealth: Payer: Self-pay

## 2023-02-12 ENCOUNTER — Other Ambulatory Visit: Payer: Self-pay | Admitting: Family

## 2023-02-12 DIAGNOSIS — I25118 Atherosclerotic heart disease of native coronary artery with other forms of angina pectoris: Secondary | ICD-10-CM

## 2023-02-12 DIAGNOSIS — I10 Essential (primary) hypertension: Secondary | ICD-10-CM

## 2023-02-12 NOTE — Telephone Encounter (Signed)
Patient called stating that she dropped off a paper this week and wanted to make sure amanda got it,    Spoke with amanda and she states that she did get it and Home health should be contacting her to come out about her legs

## 2023-02-13 NOTE — Telephone Encounter (Signed)
PCP is taking care of refills

## 2023-02-14 ENCOUNTER — Emergency Department
Admission: EM | Admit: 2023-02-14 | Discharge: 2023-02-14 | Disposition: A | Discharge status: Home / Self Care | Payer: 59 | Attending: Emergency Medicine | Admitting: Emergency Medicine

## 2023-02-14 ENCOUNTER — Telehealth: Payer: Self-pay

## 2023-02-14 ENCOUNTER — Emergency Department: Payer: 59

## 2023-02-14 DIAGNOSIS — B351 Tinea unguium: Secondary | ICD-10-CM | POA: Insufficient documentation

## 2023-02-14 DIAGNOSIS — L97911 Non-pressure chronic ulcer of unspecified part of right lower leg limited to breakdown of skin: Secondary | ICD-10-CM | POA: Diagnosis not present

## 2023-02-14 DIAGNOSIS — I25110 Atherosclerotic heart disease of native coronary artery with unstable angina pectoris: Secondary | ICD-10-CM | POA: Diagnosis not present

## 2023-02-14 DIAGNOSIS — L97811 Non-pressure chronic ulcer of other part of right lower leg limited to breakdown of skin: Secondary | ICD-10-CM | POA: Diagnosis not present

## 2023-02-14 DIAGNOSIS — I110 Hypertensive heart disease with heart failure: Secondary | ICD-10-CM | POA: Diagnosis not present

## 2023-02-14 DIAGNOSIS — L03115 Cellulitis of right lower limb: Secondary | ICD-10-CM | POA: Diagnosis not present

## 2023-02-14 DIAGNOSIS — I872 Venous insufficiency (chronic) (peripheral): Secondary | ICD-10-CM | POA: Diagnosis not present

## 2023-02-14 DIAGNOSIS — I509 Heart failure, unspecified: Secondary | ICD-10-CM | POA: Diagnosis not present

## 2023-02-14 DIAGNOSIS — L97821 Non-pressure chronic ulcer of other part of left lower leg limited to breakdown of skin: Secondary | ICD-10-CM | POA: Diagnosis not present

## 2023-02-14 DIAGNOSIS — S91001A Unspecified open wound, right ankle, initial encounter: Secondary | ICD-10-CM | POA: Diagnosis not present

## 2023-02-14 DIAGNOSIS — I2510 Atherosclerotic heart disease of native coronary artery without angina pectoris: Secondary | ICD-10-CM | POA: Insufficient documentation

## 2023-02-14 DIAGNOSIS — A419 Sepsis, unspecified organism: Secondary | ICD-10-CM | POA: Diagnosis not present

## 2023-02-14 LAB — COMPREHENSIVE METABOLIC PANEL
ALT: 23 U/L (ref 0–44)
AST: 31 U/L (ref 15–41)
Albumin: 3 g/dL — ABNORMAL LOW (ref 3.5–5.0)
Alkaline Phosphatase: 118 U/L (ref 38–126)
Anion gap: 10 (ref 5–15)
BUN: 15 mg/dL (ref 8–23)
CO2: 25 mmol/L (ref 22–32)
Calcium: 8.5 mg/dL — ABNORMAL LOW (ref 8.9–10.3)
Chloride: 95 mmol/L — ABNORMAL LOW (ref 98–111)
Creatinine, Ser: 0.79 mg/dL (ref 0.44–1.00)
GFR, Estimated: >60 mL/min (ref >60)
Glucose, Bld: 132 mg/dL — ABNORMAL HIGH (ref 70–99)
Potassium: 4.6 mmol/L (ref 3.5–5.1)
Sodium: 130 mmol/L — ABNORMAL LOW (ref 135–145)
Total Bilirubin: 0.6 mg/dL (ref 0.3–1.2)
Total Protein: 7 g/dL (ref 6.5–8.1)

## 2023-02-14 LAB — CBC WITH DIFFERENTIAL/PLATELET
Abs Immature Granulocytes: 0.03 10*3/uL (ref 0.00–0.07)
Basophils Absolute: 0 10*3/uL (ref 0.0–0.1)
Basophils Relative: 0 %
Eosinophils Absolute: 0 10*3/uL (ref 0.0–0.5)
Eosinophils Relative: 0 %
HCT: 28 % — ABNORMAL LOW (ref 36.0–46.0)
Hemoglobin: 8.4 g/dL — ABNORMAL LOW (ref 12.0–15.0)
Immature Granulocytes: 0 %
Lymphocytes Relative: 6 %
Lymphs Abs: 0.5 10*3/uL — ABNORMAL LOW (ref 0.7–4.0)
MCH: 26.9 pg (ref 26.0–34.0)
MCHC: 30 g/dL (ref 30.0–36.0)
MCV: 89.7 fL (ref 80.0–100.0)
Monocytes Absolute: 0.7 10*3/uL (ref 0.1–1.0)
Monocytes Relative: 9 %
Neutro Abs: 7.3 10*3/uL (ref 1.7–7.7)
Neutrophils Relative %: 85 %
Platelets: 460 10*3/uL — ABNORMAL HIGH (ref 150–400)
RBC: 3.12 MIL/uL — ABNORMAL LOW (ref 3.87–5.11)
RDW: 14.3 % (ref 11.5–15.5)
WBC: 8.6 10*3/uL (ref 4.0–10.5)
nRBC: 0 % (ref 0.0–0.2)

## 2023-02-14 LAB — LACTIC ACID, PLASMA: Lactic Acid, Venous: 1.8 mmol/L (ref 0.5–1.9)

## 2023-02-14 LAB — PROTIME-INR
INR: 1.1 (ref 0.8–1.2)
Prothrombin Time: 13.9 seconds (ref 11.4–15.2)

## 2023-02-14 MED ORDER — DOXYCYCLINE HYCLATE 50 MG PO CAPS: 100.0000 mg | ORAL_CAPSULE | Freq: Two times a day (BID) | ORAL | 0 refills | Status: DC

## 2023-02-14 MED ORDER — CEPHALEXIN 500 MG PO CAPS
500.0000 mg | ORAL_CAPSULE | Freq: Once | ORAL | Status: AC
  Administered 2023-02-14: 500 mg via ORAL
  Filled 2023-02-14: qty 1

## 2023-02-14 MED ORDER — CEPHALEXIN 500 MG PO CAPS: 500.0000 mg | ORAL_CAPSULE | Freq: Four times a day (QID) | ORAL | 0 refills | Status: DC

## 2023-02-14 MED ORDER — OXYCODONE-ACETAMINOPHEN 5-325 MG PO TABS
2.0000 | ORAL_TABLET | Freq: Once | ORAL | Status: AC
  Administered 2023-02-14: 2 via ORAL
  Filled 2023-02-14: qty 2

## 2023-02-14 MED ORDER — TERBINAFINE HCL 250 MG PO TABS: 250.0000 mg | ORAL_TABLET | Freq: Every day | ORAL | 1 refills | Status: AC

## 2023-02-14 MED ORDER — DOXYCYCLINE HYCLATE 100 MG PO TABS
100.0000 mg | ORAL_TABLET | Freq: Once | ORAL | Status: AC
  Administered 2023-02-14: 100 mg via ORAL
  Filled 2023-02-14: qty 1

## 2023-02-14 NOTE — Telephone Encounter (Signed)
[name] [name] with [facility] called and left vm regarding pt, with concerns with right leg. Skin on leg is flopping off from knee to ankle, draining profusely wrapping in unaboot. Pt went to urgent care 4/4 for leg, they sent keflex for pt. Pain is 10/10 & 99.6 temp, [name] [name] recommended for pt to go to ED to get IV abx. [name] [name] & informed pt needs to go to ED she said she has informed pt and will talk to pt's sister to see if she can get pt to go to [facility]. Just wanted to document. Did you want to try and call pt or [name]? Please advise

## 2023-02-14 NOTE — ED Provider Notes (Addendum)
[facility] Provider Note    Event Date/Time   First MD Initiated Contact with Patient 02/14/23 1552     (approximate)   History   Wound Infection   HPI  [name] [name] is a 88 y.o. female   Past medical history of CAD, atrial fibrillation, CHF, chronic back pain, hypertension who presents to the emergency department with skin breakdown of the right lower extremity ankle medial side which she noted 2 days ago and has pain to the area.  No trauma.  No systemic symptoms of fever, chills.  She has wound care nurse assess and dress every several days who noted skin breakdown to the medial ankle and asked her to come to the emergency department for evaluation.  Has no other acute medical complaints.  Independent Historian contributed to assessment above: Sister who is at bedside to give collateral information  External Medical Documents Reviewed: Family medicine progress note from February 2024 which addressed her chronic leg wounds and fatigue.      Physical Exam   Triage Vital Signs: ED Triage Vitals  Enc Vitals Group     BP 02/14/23 1403 (!) 165/69     Pulse Rate 02/14/23 1403 95     Resp 02/14/23 1403 18     Temp 02/14/23 1403 98.2 F (36.8 C)     Temp Source 02/14/23 1403 Oral     SpO2 02/14/23 1403 98 %     Weight 02/14/23 1405 110 lb 3.7 oz (50 kg)     Height 02/14/23 1405 5\' 4"  (1.626 m)     Head Circumference --      Peak Flow --      Pain Score 02/14/23 1404 8     Pain Loc --      Pain Edu? --      Excl. in GC? --     Most recent vital signs: Vitals:   02/14/23 1403  BP: (!) 165/69  Pulse: 95  Resp: 18  Temp: 98.2 F (36.8 C)  SpO2: 98%    General: Awake, no distress.  CV:  Good peripheral perfusion.  Resp:  Normal effort.  Abd:  No distention.  Other:  Awake alert comfortable with chronic appearing skin breakdown of the right leg from the knee down and some skin breakdown more extensive around the medial ankle with no  crepitus or pain out of proportion and the foot itself appears warm well-perfused there is no obvious cellulitic changes or fluctuance.  Sensory is intact.   ED Results / Procedures / Treatments   Labs (all labs ordered are listed, but only abnormal results are displayed) Labs Reviewed  COMPREHENSIVE METABOLIC PANEL - Abnormal; Notable for the following components:      Result Value   Sodium 130 (*)    Chloride 95 (*)    Glucose, Bld 132 (*)    Calcium 8.5 (*)    Albumin 3.0 (*)    All other components within normal limits  CBC WITH DIFFERENTIAL/PLATELET - Abnormal; Notable for the following components:   RBC 3.12 (*)    Hemoglobin 8.4 (*)    HCT 28.0 (*)    Platelets 460 (*)    Lymphs Abs 0.5 (*)    All other components within normal limits  CULTURE, BLOOD (ROUTINE X 2)  CULTURE, BLOOD (ROUTINE X 2)  LACTIC ACID, PLASMA  PROTIME-INR  LACTIC ACID, PLASMA     I ordered and reviewed the above labs they are notable for white  blood cell count is within normal limit    RADIOLOGY I independently reviewed and interpreted stray of the ankle and see no obvious free air, osteolytic changes,   PROCEDURES:  Critical Care performed: No  Procedures   MEDICATIONS ORDERED IN ED: Medications  doxycycline (VIBRA-TABS) tablet 100 mg (has no administration in time range)  oxyCODONE-acetaminophen (PERCOCET/ROXICET) 5-325 MG per tablet 2 tablet (2 tablets Oral Given 02/14/23 1622)  cephALEXin (KEFLEX) capsule 500 mg (500 mg Oral Given 02/14/23 1639)     IMPRESSION / MDM / ASSESSMENT AND PLAN / ED COURSE  I reviewed the triage vital signs and the nursing notes.                                Patient's presentation is most consistent with acute presentation with potential threat to life or bodily function.  Differential diagnosis includes, but is not limited to, cellulitis, abscess, necrotizing fasciitis, osteomyelitis, ischemic limb,    The patient is on the cardiac monitor to  evaluate for evidence of arrhythmia and/or significant heart rate changes.  MDM: This is a patient with chronic lower extremity skin changes that has been worsening and has some skin breakdown more extensive to the ankle but does not appear grossly infected nor has signs of necrotizing fasciitis.  I will cover her with antibiotics for potential cellulitis.  X-ray obtained to assess for any subcutaneous air, lytic changes of the bone on my read appears normal.    I considered hospitalization for admission or observation however given the stability of the patient with normal labs, a grossly noninfected appearing chronic leg wounds and discussion about the x-ray finding of questionable small lytic changes suggestive of potential osteomyelitis which I discussed with Dr. Annamary Rummage of podiatry - - I think a course of oral antibiotics and reassessment by PMD as well as ongoing wound care by visiting nurses most appropriate at this time.  I also had the patient instructed to call podiatry for follow-up as well.  Patient looks well and has been ambulatory and in the context of the above rationale I think outpatient follow-up is most appropriate this time.  She understands to return with any new or worsening symptoms.        FINAL CLINICAL IMPRESSION(S) / ED DIAGNOSES   Final diagnoses:  Cellulitis of right lower extremity  Skin ulcer of right lower leg, limited to breakdown of skin  Onychomycosis     Rx / DC Orders   ED Discharge Orders          Ordered    cephALEXin (KEFLEX) 500 MG capsule  4 times daily,   Status:  Discontinued        02/14/23 1633    terbinafine (LAMISIL) 250 MG tablet  Daily        02/14/23 1642    doxycycline (VIBRAMYCIN) 50 MG capsule  2 times daily        02/14/23 1744             Note:  This document was prepared using Dragon voice recognition software and may include unintentional dictation errors.    Pilar Jarvis, MD 02/14/23 1572    Pilar Jarvis,  MD 02/14/23 [phone]

## 2023-02-14 NOTE — ED Triage Notes (Signed)
Pt presents to the ED via POV due to chronic leg wounds. Pt has a HH nurse that dresses wounds and recommended she be seen here. Pt states she went some time without wound care. Pt also states "It looks like worms or something". Pt A&Ox4, Pt NAD

## 2023-02-14 NOTE — ED Notes (Signed)
Per phone call from [facility], patient has an edematous right leg that is weeping copious amount of fluid, skin is macerated and has a bad odor.

## 2023-02-14 NOTE — Discharge Instructions (Addendum)
Take your Percocet for back pain that you already take, which will help with the pain in your ankle as well.  Take antibiotic for 5 days as prescribed.  Follow-up with your primary care doctor to recheck the wound within the week.  Please also call podiatrist Dr. Annamary Rummage for a follow-up appointment to better assess the wound and reassess the ankle bone to see if there is any evidence of deeper infection.  Take terbinafine antifungal daily as prescribed for toenail fungus.  Thank you for choosing [place] for your health care today!  Please see your primary doctor this week for a follow up appointment.   Sometimes, in the early stages of certain disease courses it is difficult to detect in the emergency department evaluation -- so, it is important that you continue to monitor your symptoms and call your doctor right away or return to the emergency department if you develop any new or worsening symptoms.  Please go to the following website to schedule new (and existing) patient appointments:   [email or web address]  If you do not have a primary doctor try calling the following clinics to establish care:  If you have insurance:  [facility] [phone] [address] [place]., [place] [address]   [facility]  [phone] [address] [place] [place] [address]   If you do not have insurance:  [facility]  [phone] [address] [place] [address]   The following is another list of primary care offices in the area who are accepting new patients at this time.  Please reach out to one of them directly and let them know you would like to schedule an appointment to follow up on an Emergency Department visit, and/or to establish a new primary care provider (PCP).  There are likely other primary care clinics in the are who are accepting new patients, but this is an excellent place to start:  [facility] Lead physician: Dr Shirlee Latch [address] [place], [place] [address] [phone]  [facility] Lead Physician: Dr Alba Cory [address], [place], [place] [address] [phone]  [facility]  Lead Physician: Dr Olevia Perches [address] [place], [place] [address] [phone]  [facility] Lead Physician: Dr Sofie Hartigan [address] [place], [place], [place] [address] [phone]  [facility] at [name] Lead Physician: Dr Bari Edward [address] [place], [place] [address] [phone]   It was my pleasure to care for you today.   Daneil Dan Modesto Charon, MD

## 2023-02-15 LAB — CULTURE, BLOOD (ROUTINE X 2): Culture: NO GROWTH

## 2023-02-16 DIAGNOSIS — I25110 Atherosclerotic heart disease of native coronary artery with unstable angina pectoris: Secondary | ICD-10-CM | POA: Diagnosis not present

## 2023-02-16 DIAGNOSIS — L97811 Non-pressure chronic ulcer of other part of right lower leg limited to breakdown of skin: Secondary | ICD-10-CM | POA: Diagnosis not present

## 2023-02-16 DIAGNOSIS — I872 Venous insufficiency (chronic) (peripheral): Secondary | ICD-10-CM | POA: Diagnosis not present

## 2023-02-16 DIAGNOSIS — I509 Heart failure, unspecified: Secondary | ICD-10-CM | POA: Diagnosis not present

## 2023-02-16 DIAGNOSIS — L97821 Non-pressure chronic ulcer of other part of left lower leg limited to breakdown of skin: Secondary | ICD-10-CM | POA: Diagnosis not present

## 2023-02-16 DIAGNOSIS — I110 Hypertensive heart disease with heart failure: Secondary | ICD-10-CM | POA: Diagnosis not present

## 2023-02-18 ENCOUNTER — Other Ambulatory Visit: Payer: Self-pay | Admitting: Family

## 2023-02-18 DIAGNOSIS — I110 Hypertensive heart disease with heart failure: Secondary | ICD-10-CM | POA: Diagnosis not present

## 2023-02-18 DIAGNOSIS — L97811 Non-pressure chronic ulcer of other part of right lower leg limited to breakdown of skin: Secondary | ICD-10-CM | POA: Diagnosis not present

## 2023-02-18 DIAGNOSIS — I509 Heart failure, unspecified: Secondary | ICD-10-CM | POA: Diagnosis not present

## 2023-02-18 DIAGNOSIS — L97821 Non-pressure chronic ulcer of other part of left lower leg limited to breakdown of skin: Secondary | ICD-10-CM | POA: Diagnosis not present

## 2023-02-18 DIAGNOSIS — I25110 Atherosclerotic heart disease of native coronary artery with unstable angina pectoris: Secondary | ICD-10-CM | POA: Diagnosis not present

## 2023-02-18 DIAGNOSIS — I872 Venous insufficiency (chronic) (peripheral): Secondary | ICD-10-CM | POA: Diagnosis not present

## 2023-02-18 LAB — CULTURE, BLOOD (ROUTINE X 2)

## 2023-02-18 MED ORDER — OXYCODONE-ACETAMINOPHEN 10-325 MG PO TABS: 1.0000 | ORAL_TABLET | Freq: Four times a day (QID) | ORAL | 0 refills | Status: DC | PRN

## 2023-02-19 ENCOUNTER — Telehealth: Payer: Self-pay | Admitting: Family

## 2023-02-19 ENCOUNTER — Telehealth: Payer: Self-pay

## 2023-02-19 DIAGNOSIS — I509 Heart failure, unspecified: Secondary | ICD-10-CM | POA: Diagnosis not present

## 2023-02-19 DIAGNOSIS — S91301A Unspecified open wound, right foot, initial encounter: Secondary | ICD-10-CM

## 2023-02-19 DIAGNOSIS — S91302A Unspecified open wound, left foot, initial encounter: Secondary | ICD-10-CM

## 2023-02-19 DIAGNOSIS — L97821 Non-pressure chronic ulcer of other part of left lower leg limited to breakdown of skin: Secondary | ICD-10-CM | POA: Diagnosis not present

## 2023-02-19 DIAGNOSIS — L97811 Non-pressure chronic ulcer of other part of right lower leg limited to breakdown of skin: Secondary | ICD-10-CM | POA: Diagnosis not present

## 2023-02-19 DIAGNOSIS — S81802S Unspecified open wound, left lower leg, sequela: Secondary | ICD-10-CM

## 2023-02-19 DIAGNOSIS — I25110 Atherosclerotic heart disease of native coronary artery with unstable angina pectoris: Secondary | ICD-10-CM | POA: Diagnosis not present

## 2023-02-19 DIAGNOSIS — I110 Hypertensive heart disease with heart failure: Secondary | ICD-10-CM | POA: Diagnosis not present

## 2023-02-19 DIAGNOSIS — I872 Venous insufficiency (chronic) (peripheral): Secondary | ICD-10-CM | POA: Diagnosis not present

## 2023-02-19 DIAGNOSIS — S81801S Unspecified open wound, right lower leg, sequela: Secondary | ICD-10-CM

## 2023-02-19 LAB — CULTURE, BLOOD (ROUTINE X 2): Special Requests: ADEQUATE

## 2023-02-19 NOTE — Telephone Encounter (Signed)
Patient was at wound center on Sunday and had tests ran on her legs. She said [name] [name] was requesting that she have this done so she wanted to let her know that it has been done.

## 2023-02-19 NOTE — Telephone Encounter (Signed)
Magda Paganini nurse with [facility] called and left vm regarding if you can send referral to wound care clinic for pt, said bilateral lower extremity ulcerative, said if we can send referral due to it being severe? Please advise (I didn't know if you already have sent this referral or not)

## 2023-02-20 ENCOUNTER — Inpatient Hospital Stay
Admission: EM | Admit: 2023-02-20 | Discharge: 2023-03-03 | DRG: 356 | Disposition: A | Discharge status: Skilled Nursing Facility | Payer: 59 | Attending: Internal Medicine | Admitting: Internal Medicine

## 2023-02-20 ENCOUNTER — Emergency Department: Payer: 59

## 2023-02-20 ENCOUNTER — Inpatient Hospital Stay: Payer: 59

## 2023-02-20 ENCOUNTER — Other Ambulatory Visit: Payer: Self-pay

## 2023-02-20 DIAGNOSIS — R0602 Shortness of breath: Secondary | ICD-10-CM | POA: Diagnosis not present

## 2023-02-20 DIAGNOSIS — E871 Hypo-osmolality and hyponatremia: Secondary | ICD-10-CM | POA: Diagnosis not present

## 2023-02-20 DIAGNOSIS — L0390 Cellulitis, unspecified: Secondary | ICD-10-CM | POA: Diagnosis not present

## 2023-02-20 DIAGNOSIS — Z79899 Other long term (current) drug therapy: Secondary | ICD-10-CM

## 2023-02-20 DIAGNOSIS — N179 Acute kidney failure, unspecified: Secondary | ICD-10-CM

## 2023-02-20 DIAGNOSIS — Z681 Body mass index (BMI) 19 or less, adult: Secondary | ICD-10-CM | POA: Diagnosis not present

## 2023-02-20 DIAGNOSIS — D62 Acute posthemorrhagic anemia: Secondary | ICD-10-CM | POA: Diagnosis not present

## 2023-02-20 DIAGNOSIS — Z743 Need for continuous supervision: Secondary | ICD-10-CM | POA: Diagnosis not present

## 2023-02-20 DIAGNOSIS — I4891 Unspecified atrial fibrillation: Secondary | ICD-10-CM | POA: Diagnosis not present

## 2023-02-20 DIAGNOSIS — K449 Diaphragmatic hernia without obstruction or gangrene: Secondary | ICD-10-CM | POA: Diagnosis not present

## 2023-02-20 DIAGNOSIS — R2681 Unsteadiness on feet: Secondary | ICD-10-CM | POA: Diagnosis not present

## 2023-02-20 DIAGNOSIS — L03115 Cellulitis of right lower limb: Secondary | ICD-10-CM | POA: Diagnosis not present

## 2023-02-20 DIAGNOSIS — I739 Peripheral vascular disease, unspecified: Secondary | ICD-10-CM | POA: Insufficient documentation

## 2023-02-20 DIAGNOSIS — Z7902 Long term (current) use of antithrombotics/antiplatelets: Secondary | ICD-10-CM

## 2023-02-20 DIAGNOSIS — Z8249 Family history of ischemic heart disease and other diseases of the circulatory system: Secondary | ICD-10-CM | POA: Diagnosis not present

## 2023-02-20 DIAGNOSIS — Z791 Long term (current) use of non-steroidal anti-inflammatories (NSAID): Secondary | ICD-10-CM

## 2023-02-20 DIAGNOSIS — I2510 Atherosclerotic heart disease of native coronary artery without angina pectoris: Secondary | ICD-10-CM | POA: Diagnosis present

## 2023-02-20 DIAGNOSIS — K31811 Angiodysplasia of stomach and duodenum with bleeding: Secondary | ICD-10-CM | POA: Diagnosis not present

## 2023-02-20 DIAGNOSIS — L03119 Cellulitis of unspecified part of limb: Secondary | ICD-10-CM | POA: Diagnosis not present

## 2023-02-20 DIAGNOSIS — I482 Chronic atrial fibrillation, unspecified: Secondary | ICD-10-CM | POA: Diagnosis not present

## 2023-02-20 DIAGNOSIS — I5022 Chronic systolic (congestive) heart failure: Secondary | ICD-10-CM | POA: Diagnosis not present

## 2023-02-20 DIAGNOSIS — Z955 Presence of coronary angioplasty implant and graft: Secondary | ICD-10-CM

## 2023-02-20 DIAGNOSIS — Q2733 Arteriovenous malformation of digestive system vessel: Secondary | ICD-10-CM | POA: Diagnosis not present

## 2023-02-20 DIAGNOSIS — I25118 Atherosclerotic heart disease of native coronary artery with other forms of angina pectoris: Secondary | ICD-10-CM | POA: Diagnosis not present

## 2023-02-20 DIAGNOSIS — S91001A Unspecified open wound, right ankle, initial encounter: Secondary | ICD-10-CM | POA: Diagnosis not present

## 2023-02-20 DIAGNOSIS — M549 Dorsalgia, unspecified: Secondary | ICD-10-CM | POA: Diagnosis not present

## 2023-02-20 DIAGNOSIS — Z7901 Long term (current) use of anticoagulants: Secondary | ICD-10-CM | POA: Diagnosis not present

## 2023-02-20 DIAGNOSIS — I110 Hypertensive heart disease with heart failure: Secondary | ICD-10-CM | POA: Diagnosis present

## 2023-02-20 DIAGNOSIS — G8929 Other chronic pain: Secondary | ICD-10-CM | POA: Diagnosis not present

## 2023-02-20 DIAGNOSIS — Z736 Limitation of activities due to disability: Secondary | ICD-10-CM | POA: Diagnosis not present

## 2023-02-20 DIAGNOSIS — K922 Gastrointestinal hemorrhage, unspecified: Secondary | ICD-10-CM | POA: Diagnosis not present

## 2023-02-20 DIAGNOSIS — L97929 Non-pressure chronic ulcer of unspecified part of left lower leg with unspecified severity: Secondary | ICD-10-CM | POA: Diagnosis not present

## 2023-02-20 DIAGNOSIS — L97312 Non-pressure chronic ulcer of right ankle with fat layer exposed: Secondary | ICD-10-CM | POA: Diagnosis not present

## 2023-02-20 DIAGNOSIS — I10 Essential (primary) hypertension: Secondary | ICD-10-CM | POA: Diagnosis present

## 2023-02-20 DIAGNOSIS — Z87891 Personal history of nicotine dependence: Secondary | ICD-10-CM

## 2023-02-20 DIAGNOSIS — K31819 Angiodysplasia of stomach and duodenum without bleeding: Secondary | ICD-10-CM | POA: Diagnosis not present

## 2023-02-20 DIAGNOSIS — D649 Anemia, unspecified: Secondary | ICD-10-CM | POA: Diagnosis not present

## 2023-02-20 DIAGNOSIS — I480 Paroxysmal atrial fibrillation: Secondary | ICD-10-CM | POA: Diagnosis not present

## 2023-02-20 DIAGNOSIS — E43 Unspecified severe protein-calorie malnutrition: Secondary | ICD-10-CM | POA: Diagnosis not present

## 2023-02-20 DIAGNOSIS — Z96643 Presence of artificial hip joint, bilateral: Secondary | ICD-10-CM | POA: Diagnosis present

## 2023-02-20 DIAGNOSIS — R636 Underweight: Secondary | ICD-10-CM | POA: Diagnosis not present

## 2023-02-20 DIAGNOSIS — D519 Vitamin B12 deficiency anemia, unspecified: Secondary | ICD-10-CM | POA: Diagnosis not present

## 2023-02-20 DIAGNOSIS — D509 Iron deficiency anemia, unspecified: Secondary | ICD-10-CM | POA: Diagnosis present

## 2023-02-20 DIAGNOSIS — R6889 Other general symptoms and signs: Secondary | ICD-10-CM | POA: Diagnosis not present

## 2023-02-20 DIAGNOSIS — I70249 Atherosclerosis of native arteries of left leg with ulceration of unspecified site: Secondary | ICD-10-CM | POA: Diagnosis not present

## 2023-02-20 DIAGNOSIS — Z888 Allergy status to other drugs, medicaments and biological substances status: Secondary | ICD-10-CM

## 2023-02-20 DIAGNOSIS — Z882 Allergy status to sulfonamides status: Secondary | ICD-10-CM

## 2023-02-20 DIAGNOSIS — I70261 Atherosclerosis of native arteries of extremities with gangrene, right leg: Secondary | ICD-10-CM | POA: Diagnosis present

## 2023-02-20 DIAGNOSIS — L97322 Non-pressure chronic ulcer of left ankle with fat layer exposed: Secondary | ICD-10-CM | POA: Diagnosis not present

## 2023-02-20 DIAGNOSIS — R600 Localized edema: Secondary | ICD-10-CM | POA: Diagnosis not present

## 2023-02-20 DIAGNOSIS — K921 Melena: Secondary | ICD-10-CM | POA: Diagnosis not present

## 2023-02-20 DIAGNOSIS — L97919 Non-pressure chronic ulcer of unspecified part of right lower leg with unspecified severity: Secondary | ICD-10-CM | POA: Diagnosis not present

## 2023-02-20 DIAGNOSIS — Z720 Tobacco use: Secondary | ICD-10-CM | POA: Diagnosis present

## 2023-02-20 DIAGNOSIS — Z85828 Personal history of other malignant neoplasm of skin: Secondary | ICD-10-CM

## 2023-02-20 DIAGNOSIS — L97522 Non-pressure chronic ulcer of other part of left foot with fat layer exposed: Secondary | ICD-10-CM | POA: Diagnosis present

## 2023-02-20 DIAGNOSIS — I25110 Atherosclerotic heart disease of native coronary artery with unstable angina pectoris: Secondary | ICD-10-CM | POA: Diagnosis present

## 2023-02-20 DIAGNOSIS — I70262 Atherosclerosis of native arteries of extremities with gangrene, left leg: Secondary | ICD-10-CM | POA: Diagnosis present

## 2023-02-20 DIAGNOSIS — M6259 Muscle wasting and atrophy, not elsewhere classified, multiple sites: Secondary | ICD-10-CM | POA: Diagnosis not present

## 2023-02-20 DIAGNOSIS — R0989 Other specified symptoms and signs involving the circulatory and respiratory systems: Secondary | ICD-10-CM | POA: Diagnosis present

## 2023-02-20 DIAGNOSIS — I70239 Atherosclerosis of native arteries of right leg with ulceration of unspecified site: Secondary | ICD-10-CM | POA: Diagnosis not present

## 2023-02-20 DIAGNOSIS — R001 Bradycardia, unspecified: Secondary | ICD-10-CM | POA: Diagnosis not present

## 2023-02-20 DIAGNOSIS — R5383 Other fatigue: Secondary | ICD-10-CM | POA: Diagnosis not present

## 2023-02-20 DIAGNOSIS — I96 Gangrene, not elsewhere classified: Secondary | ICD-10-CM

## 2023-02-20 DIAGNOSIS — I5021 Acute systolic (congestive) heart failure: Secondary | ICD-10-CM | POA: Diagnosis present

## 2023-02-20 DIAGNOSIS — I700 Atherosclerosis of aorta: Secondary | ICD-10-CM | POA: Diagnosis not present

## 2023-02-20 DIAGNOSIS — I252 Old myocardial infarction: Secondary | ICD-10-CM

## 2023-02-20 DIAGNOSIS — I70263 Atherosclerosis of native arteries of extremities with gangrene, bilateral legs: Secondary | ICD-10-CM | POA: Diagnosis not present

## 2023-02-20 HISTORY — DX: Acute kidney failure, unspecified: N17.9

## 2023-02-20 LAB — BASIC METABOLIC PANEL
Anion gap: 11 (ref 5–15)
BUN: 43 mg/dL — ABNORMAL HIGH (ref 8–23)
CO2: 22 mmol/L (ref 22–32)
Calcium: 8.3 mg/dL — ABNORMAL LOW (ref 8.9–10.3)
Chloride: 92 mmol/L — ABNORMAL LOW (ref 98–111)
Creatinine, Ser: 1.1 mg/dL — ABNORMAL HIGH (ref 0.44–1.00)
GFR, Estimated: 49 mL/min — ABNORMAL LOW (ref >60)
Glucose, Bld: 125 mg/dL — ABNORMAL HIGH (ref 70–99)
Potassium: 4.4 mmol/L (ref 3.5–5.1)
Sodium: 125 mmol/L — ABNORMAL LOW (ref 135–145)

## 2023-02-20 LAB — CBC
HCT: 27.2 % — ABNORMAL LOW (ref 36.0–46.0)
Hemoglobin: 8.5 g/dL — ABNORMAL LOW (ref 12.0–15.0)
MCH: 26.5 pg (ref 26.0–34.0)
MCHC: 31.3 g/dL (ref 30.0–36.0)
MCV: 84.7 fL (ref 80.0–100.0)
Platelets: 608 10*3/uL — ABNORMAL HIGH (ref 150–400)
RBC: 3.21 MIL/uL — ABNORMAL LOW (ref 3.87–5.11)
RDW: 14.4 % (ref 11.5–15.5)
WBC: 9.1 10*3/uL (ref 4.0–10.5)
nRBC: 0 % (ref 0.0–0.2)

## 2023-02-20 LAB — TROPONIN I (HIGH SENSITIVITY): Troponin I (High Sensitivity): 12 ng/L (ref <18)

## 2023-02-20 MED ORDER — GABAPENTIN 400 MG PO CAPS
400.0000 mg | ORAL_CAPSULE | Freq: Four times a day (QID) | ORAL | Status: DC
  Administered 2023-02-21 – 2023-03-03 (×41): 400 mg via ORAL
  Filled 2023-02-20 (×41): qty 1

## 2023-02-20 MED ORDER — CLOPIDOGREL BISULFATE 75 MG PO TABS
75.0000 mg | ORAL_TABLET | Freq: Every day | ORAL | Status: DC
  Administered 2023-02-21: 75 mg via ORAL
  Filled 2023-02-20: qty 1

## 2023-02-20 MED ORDER — ALBUTEROL SULFATE (2.5 MG/3ML) 0.083% IN NEBU: 3.0000 mL | INHALATION_SOLUTION | RESPIRATORY_TRACT | Status: DC | PRN

## 2023-02-20 MED ORDER — SODIUM CHLORIDE 0.9 % IV SOLN: Freq: Once | INTRAVENOUS | Status: AC

## 2023-02-20 MED ORDER — HEPARIN SODIUM (PORCINE) 5000 UNIT/ML IJ SOLN
5000.0000 [IU] | Freq: Three times a day (TID) | INTRAMUSCULAR | Status: DC
  Administered 2023-02-21: 5000 [IU] via SUBCUTANEOUS
  Filled 2023-02-20: qty 1

## 2023-02-20 MED ORDER — METOPROLOL SUCCINATE ER 25 MG PO TB24
25.0000 mg | ORAL_TABLET | Freq: Every day | ORAL | Status: DC
  Administered 2023-02-21 – 2023-02-28 (×8): 25 mg via ORAL
  Filled 2023-02-20 (×9): qty 1

## 2023-02-20 MED ORDER — MORPHINE SULFATE (PF) 2 MG/ML IV SOLN
2.0000 mg | Freq: Once | INTRAVENOUS | Status: AC
  Administered 2023-02-20: 2 mg via INTRAVENOUS
  Filled 2023-02-20: qty 1

## 2023-02-20 MED ORDER — ACETAMINOPHEN 325 MG PO TABS: 650.0000 mg | ORAL_TABLET | Freq: Four times a day (QID) | ORAL | Status: DC | PRN

## 2023-02-20 MED ORDER — SODIUM CHLORIDE 0.9 % IV SOLN: INTRAVENOUS | Status: AC

## 2023-02-20 MED ORDER — VANCOMYCIN HCL IN DEXTROSE 1-5 GM/200ML-% IV SOLN
1000.0000 mg | Freq: Once | INTRAVENOUS | Status: AC
  Administered 2023-02-20: 1000 mg via INTRAVENOUS
  Filled 2023-02-20: qty 200

## 2023-02-20 MED ORDER — FENTANYL CITRATE PF 50 MCG/ML IJ SOSY
50.0000 ug | PREFILLED_SYRINGE | Freq: Once | INTRAMUSCULAR | Status: AC
  Administered 2023-02-20: 50 ug via INTRAVENOUS
  Filled 2023-02-20: qty 1

## 2023-02-20 MED ORDER — ACETAMINOPHEN 650 MG RE SUPP: 650.0000 mg | Freq: Four times a day (QID) | RECTAL | Status: DC | PRN

## 2023-02-20 MED ORDER — HYDRALAZINE HCL 20 MG/ML IJ SOLN
5.0000 mg | INTRAMUSCULAR | Status: DC | PRN
  Administered 2023-02-22 – 2023-02-26 (×2): 5 mg via INTRAVENOUS
  Filled 2023-02-20 (×2): qty 1

## 2023-02-20 MED ORDER — MORPHINE SULFATE (PF) 2 MG/ML IV SOLN
1.0000 mg | Freq: Three times a day (TID) | INTRAVENOUS | Status: DC | PRN
  Administered 2023-02-21 – 2023-02-22 (×2): 1 mg via INTRAVENOUS
  Filled 2023-02-20 (×2): qty 1

## 2023-02-20 MED ORDER — SODIUM CHLORIDE 0.9% FLUSH
3.0000 mL | Freq: Two times a day (BID) | INTRAVENOUS | Status: DC
  Administered 2023-02-21 – 2023-03-03 (×20): 3 mL via INTRAVENOUS

## 2023-02-20 NOTE — ED Notes (Addendum)
Leg wraps removed for visualization of bilateral lower extremities. VO from MD to leave wounds undressed until hospitalist can visualize. Green and yellow, foul smelling drainage noted to dressings. Pt repositioned, helped onto bed pan. Repositioned and cleaned up. VO per MD not to draw second trop

## 2023-02-20 NOTE — ED Triage Notes (Signed)
Pt to ED for shortness of breath since this afternoon.Hx CHF, NSTEMI.  Legs are wrapped with ace wraps due to chronic wounds. Lungs sound clear.

## 2023-02-20 NOTE — ED Provider Notes (Signed)
[facility] Provider Note    Event Date/Time   First MD Initiated Contact with Patient 02/20/23 1919     (approximate)   History   Shortness of Breath   HPI  [name] [name] is a 88 y.o. female who presents to the emergency department today with concerns for both an episode of shortness of breath and leg wounds.  In terms of the shortness of breath it did last a short time.  Somewhat unclear etiology although family members do wonder if it might of been some anxiety.  She has also been dealing with leg wounds.  This has been going on for few weeks.  Had been seen in the emergency department a week ago.  Family member states the patient has been taking oral antibiotics without any improvement.  Patient is complaining of pain primarily in her right leg.     Physical Exam   Triage Vital Signs: ED Triage Vitals  Enc Vitals Group     BP 02/20/23 1647 134/84     Pulse Rate 02/20/23 1647 78     Resp 02/20/23 1647 18     Temp 02/20/23 1647 98 F (36.7 C)     Temp Source 02/20/23 1647 Oral     SpO2 02/20/23 1647 100 %     Weight 02/20/23 1651 110 lb 3.7 oz (50 kg)     Height 02/20/23 1651 5\' 4"  (1.626 m)     Head Circumference --      Peak Flow --      Pain Score 02/20/23 1650 0     Pain Loc --      Pain Edu? --      Excl. in GC? --     Most recent vital signs: Vitals:   02/20/23 1647  BP: 134/84  Pulse: 78  Resp: 18  Temp: 98 F (36.7 C)  SpO2: 100%   General: Awake, alert, oriented. CV:  Good peripheral perfusion.  Resp:  Normal effort. Lungs clear. Abd:  No distention. Non tender. Other:  Multiple skin wounds to right lower leg, malodorous with pus and surrounding erythema.    ED Results / Procedures / Treatments   Labs (all labs ordered are listed, but only abnormal results are displayed) Labs Reviewed  BASIC METABOLIC PANEL - Abnormal; Notable for the following components:      Result Value   Sodium 125 (*)    Chloride 92 (*)     Glucose, Bld 125 (*)    BUN 43 (*)    Creatinine, Ser 1.10 (*)    Calcium 8.3 (*)    GFR, Estimated 49 (*)    All other components within normal limits  CBC - Abnormal; Notable for the following components:   RBC 3.21 (*)    Hemoglobin 8.5 (*)    HCT 27.2 (*)    Platelets 608 (*)    All other components within normal limits  TROPONIN I (HIGH SENSITIVITY)  TROPONIN I (HIGH SENSITIVITY)     EKG  I, Phineas Semen, attending physician, personally viewed and interpreted this EKG  EKG Time: 1702 Rate: 59 Rhythm: sinus bradycardia with PAC Axis: normal Intervals: qtc 401 QRS: narrow, q waves v1, v2 ST changes: no st elevation Impression: abnormal ekg    RADIOLOGY I independently interpreted and visualized the CXR. My interpretation: No pneumonia. Radiology interpretation:  IMPRESSION:  1. Stable aeration of the lungs since 02/14/2023 with no new or  worsening focal airspace disease.  2. Hyperinflated  lungs consistent with underlying COPD.  3. S shaped curvature of the spine with age-indeterminate  compression deformity of the L1 vertebral body.     PROCEDURES:  Critical Care performed: Yes  CRITICAL CARE Performed by: Phineas SemenGraydon Rae Sutcliffe   Total critical care time: 35 minutes  Critical care time was exclusive of separately billable procedures and treating other patients.  Critical care was necessary to treat or prevent imminent or life-threatening deterioration.  Critical care was time spent personally by me on the following activities: development of treatment plan with patient and/or surrogate as well as nursing, discussions with consultants, evaluation of patient's response to treatment, examination of patient, obtaining history from patient or surrogate, ordering and performing treatments and interventions, ordering and review of laboratory studies, ordering and review of radiographic studies, pulse oximetry and re-evaluation of patient's condition.    Procedures    MEDICATIONS ORDERED IN ED: Medications  albuterol (PROVENTIL) (2.5 MG/3ML) 0.083% nebulizer solution 3 mL (has no administration in time range)     IMPRESSION / MDM / ASSESSMENT AND PLAN / ED COURSE  I reviewed the triage vital signs and the nursing notes.                              Differential diagnosis includes, but is not limited to, cellulitis, osteomyelitis, pneumonia, anemia  Patient's presentation is most consistent with acute presentation with potential threat to life or bodily function.   The patient is on the cardiac monitor to evaluate for evidence of arrhythmia and/or significant heart rate changes.  Patient presented to the emergency department today because of concerns for shortness of breath and leg pains.  The time my exam and shortness of breath episode had resolved.  Chest x-ray without any concerning abnormalities.  Unclear if she had slight episode of panic or anxiety attack.  However more concerning perhaps is the wounds on her right lower leg.  She has multiple open ulcers on that right lower leg with pus and surrounding erythema.  She has been on oral antibiotics and has not had any improvement.  Per chart review patient had an x-ray done roughly 1 week ago.  It did show possible concern for osteo-.  Because of that the x-ray was repeated.  It did not show any osteomyelitis.  However given concern for significant worsening infection in that right lower extremity and possible involvement to the bones I do think patient would benefit from IV antibiotics.  Did discuss findings and plan with patient and family.  Discussed with Dr. Allena KatzPatel with the hospitalist service who will plan on admission.    FINAL CLINICAL IMPRESSION(S) / ED DIAGNOSES   Final diagnoses:  Cellulitis, unspecified cellulitis site      Note:  This document was prepared using Dragon voice recognition software and may include unintentional dictation errors.    Phineas SemenGoodman,  Ha Shannahan, MD 02/21/23 [phone]

## 2023-02-20 NOTE — Hospital Course (Addendum)
88 year old female with history of lower extremity wounds, PAD, CAD, atrial fibrillation, congestive heart failure presents with lower extremity wounds getting worse.  Patient was given doxycycline a few days ago for cellulitis but has gotten worse.  In the ER she was placed on Rocephin. Patient developed upper GI bleed on 4/12 with a hemoglobin dropped down to 7.3, she was placed on Protonix IV, received 1 units of PRBC, EGD was performed on 4/15, showed multiple angiodysplastic lesions, cauterized. Patient undergone right lower extremity angiogram and angioplasty with stent on 4/16, restarted aspirin Plavix post procedure.

## 2023-02-20 NOTE — H&P (Addendum)
History and Physical     Patient: [name] [name] ZOX:[id number] DOB: 07/04/1935 DOA: 02/20/2023 DOS: the patient was seen and examined on 02/21/2023 PCP: Miki Kins, FNP   Patient coming from: Home  Chief Complaint: Cellulitis  HISTORY OF PRESENT ILLNESS: [name] [name] is an 88 y.o. female with past medical history of lower extremity wounds, PAD, CAD, A-fib, congestive heart failure presented For wounds on lower extremity that are progressively getting worse.  Patient does have home health and wound care routinely manages  her wounds and patient is also followed by the wound clinic. Patient has had ABIs in the past with Doppler pulses today per ED provider.  Patient was seen in the emergency room a few days ago where she was seen by Dr. Renaldo Reel and given doxycycline for her cellulitis However patient has failed outpatient therapy.  Past Medical History:  Diagnosis Date   Atrial fibrillation    Basal cell carcinoma 2021   L temple   Chronic back pain    Congestive heart failure (CHF)    History of total hip replacement (Bilateral) 01/12/2020   Left hip replacement done in 2019.  Right hip replacement done in 2012.   Hypertension    Hyponatremia 06/11/2020   NSTEMI (non-ST elevated myocardial infarction) 06/11/2020   Pharmacologic therapy 01/12/2020   Problems influencing health status 01/12/2020   Scoliosis    Review of Systems  Unable to perform ROS: Dementia   Allergies  Allergen Reactions   Spironolactone Anaphylaxis   Fluogen [Influenza Virus Vaccine] Hives   Statins     Dizziness & weakness   Sulfa Antibiotics Other (See Comments)    Unknown, happened as a child   Past Surgical History:  Procedure Laterality Date   ABDOMINAL SURGERY     bilateral hip replacement     CORONARY STENT INTERVENTION N/A 06/12/2020   Procedure: CORONARY STENT INTERVENTION;  Surgeon: Iran Ouch, MD;  Location: [facility] INVASIVE CV LAB;  Service: Cardiovascular;  Laterality: N/A;   LAD   LEFT HEART CATH AND CORONARY ANGIOGRAPHY N/A 06/12/2020   Procedure: LEFT HEART CATH AND CORONARY ANGIOGRAPHY;  Surgeon: Iran Ouch, MD;  Location: [facility] INVASIVE CV LAB;  Service: Cardiovascular;  Laterality: N/A;   TONSILLECTOMY     MEDICATIONS: Prior to Admission medications   Medication Sig Start Date End Date Taking? Authorizing Provider  clopidogrel (PLAVIX) 75 MG tablet Take 1 tablet (75 mg total) by mouth daily with breakfast. 06/23/20   Alver Sorrow, NP  doxycycline (VIBRAMYCIN) 50 MG capsule Take 2 capsules (100 mg total) by mouth 2 (two) times daily for 14 days. 02/14/23 02/28/23  Pilar Jarvis, MD  gabapentin (NEURONTIN) 400 MG capsule Take 400 mg by mouth 4 (four) times daily.    [provider]  lisinopril (ZESTRIL) 5 MG tablet TAKE 1 TABLET (5 MG TOTAL) BY MOUTH DAILY. NEED APPOINTMENT 02/12/23   Miki Kins, FNP  meloxicam (MOBIC) 15 MG tablet TAKE 1 TABLET BY MOUTH EVERY DAY 02/18/23   Miki Kins, FNP  metoprolol succinate (TOPROL-XL) 25 MG 24 hr tablet Take 25 mg by mouth daily. 10/24/22   [provider]  oxyCODONE-acetaminophen (PERCOCET) 10-325 MG tablet Take 1 tablet by mouth every 6 (six) hours as needed for pain. 02/22/23   Miki Kins, FNP  terbinafine (LAMISIL) 250 MG tablet Take 1 tablet (250 mg total) by mouth daily. 02/14/23 05/09/23  Pilar Jarvis, MD    sodium chloride     cefTRIAXone (ROCEPHIN)  IV      ED Course: Pt in Ed patient is hypertensive afebrile O2 sats 99% on room air. Vitals:   02/20/23 2155 02/20/23 2200 02/20/23 2223 02/20/23 2230  BP: (!) 199/100 (!) 211/85  (!) 202/80  Pulse: 88     Resp: 17 14  15   Temp:   98.1 F (36.7 C)   TempSrc:   Oral   SpO2: 99%     Weight:      Height:       Total I/O In: 191 [IV Piggyback:191] Out: -  SpO2: 99 % EKG today shows sinus bradycardia at 59 with a QTc of 401, no ST-T wave changes.  Occasional PACs.    Chest x-ray showed no new or worsening focal airspace  disease, reinflated lungs consistent with COPD. Blood work in ed shows: Hyponatremia of 125 glucose of 125 AKI of 1.10 and EGFR 49. Will order LFTs. CBC shows normal white count of 9.1 chronic anemia with a hemoglobin of 8.5 platelets 03/17/2007 normal MCV no differentials. X-ray of the right ankle shows no acute osseous abnormality and some ulcers in the lower leg and ankle.  Chest x-ray today shows hyperinflated lungs and associated spinal deformity stable aeration with no new or worsening airspace disease. Last 2 d echo in 06/2020:        Results for orders placed or performed during the hospital encounter of 02/20/23 (from the past 72 hour(s))  Basic metabolic panel     Status: Abnormal   Collection Time: 02/20/23  5:00 PM  Result Value Ref Range   Sodium 125 (L) 135 - 145 mmol/L   Potassium 4.4 3.5 - 5.1 mmol/L   Chloride 92 (L) 98 - 111 mmol/L   CO2 22 22 - 32 mmol/L   Glucose, Bld 125 (H) 70 - 99 mg/dL    Comment: Glucose reference range applies only to samples taken after fasting for at least 8 hours.   BUN 43 (H) 8 - 23 mg/dL   Creatinine, Ser 1.61 (H) 0.44 - 1.00 mg/dL   Calcium 8.3 (L) 8.9 - 10.3 mg/dL   GFR, Estimated 49 (L) >60 mL/min    Comment: (NOTE) Calculated using the CKD-EPI Creatinine Equation (2021)    Anion gap 11 5 - 15    Comment: Performed at [facility], [address] [place], [place] [address]  CBC     Status: Abnormal   Collection Time: 02/20/23  5:00 PM  Result Value Ref Range   WBC 9.1 4.0 - 10.5 K/uL   RBC 3.21 (L) 3.87 - 5.11 MIL/uL   Hemoglobin 8.5 (L) 12.0 - 15.0 g/dL   HCT 54.0 (L) 98.1 - 19.1 %   MCV 84.7 80.0 - 100.0 fL   MCH 26.5 26.0 - 34.0 pg   MCHC 31.3 30.0 - 36.0 g/dL   RDW 47.8 29.5 - 62.1 %   Platelets 608 (H) 150 - 400 K/uL   nRBC 0.0 0.0 - 0.2 %    Comment: Performed at [facility], [address], [place], [place] [address]  Troponin I (High Sensitivity)     Status: None   Collection Time: 02/20/23   5:00 PM  Result Value Ref Range   Troponin I (High Sensitivity) 12 <18 ng/L    Comment: (NOTE) Elevated high sensitivity troponin I (hsTnI) values and significant  changes across serial measurements may suggest ACS but many other  chronic and acute conditions are known to elevate hsTnI results.  Refer to  the "Links" section for chest pain algorithms and additional  guidance. Performed at [facility], [address] [place], [place] [address]     Lab Results  Component Value Date   CREATININE 1.10 (H) 02/20/2023   CREATININE 0.79 02/14/2023   CREATININE 0.85 06/14/2020      Latest Ref Rng & Units 02/20/2023    5:00 PM 02/14/2023    2:11 PM 06/14/2020    5:47 AM  CMP  Glucose 70 - 99 mg/dL 846  962  952   BUN 8 - 23 mg/dL 43  15  17   Creatinine 0.44 - 1.00 mg/dL 8.41  3.24  4.01   Sodium 135 - 145 mmol/L 125  130  136   Potassium 3.5 - 5.1 mmol/L 4.4  4.6  3.7   Chloride 98 - 111 mmol/L 92  95  100   CO2 22 - 32 mmol/L 22  25  27    Calcium 8.9 - 10.3 mg/dL 8.3  8.5  8.3   Total Protein 6.5 - 8.1 g/dL  7.0    Total Bilirubin 0.3 - 1.2 mg/dL  0.6    Alkaline Phos 38 - 126 U/L  118    AST 15 - 41 U/L  31    ALT 0 - 44 U/L  23     Unresulted Labs (From admission, onward)     Start     Ordered   02/21/23 0500  Comprehensive metabolic panel  Tomorrow morning,   STAT        02/20/23 2331   02/21/23 0500  CBC  Tomorrow morning,   STAT        02/20/23 2331   02/21/23 0047  Type and screen  Once,   R        02/21/23 0046           Pt has received : Orders Placed This Encounter  Procedures   DG Chest 2 View    If patient pregnant, contact provider.    Standing Status:   Standing    Number of Occurrences:   1    Order Specific Question:   Reason for Exam (SYMPTOM  OR DIAGNOSIS REQUIRED)    Answer:   SOB    Order Specific Question:   Radiology Contrast Protocol - do NOT remove file path    Answer:    [email or web address]   DG Ankle Complete Right    Standing Status:   Standing    Number of Occurrences:   1    Order Specific Question:   Reason for Exam (SYMPTOM  OR DIAGNOSIS REQUIRED)    Answer:   concern for osteo   CT TIBIA FIBULA RIGHT WO CONTRAST    Standing Status:   Standing    Number of Occurrences:   1   Basic metabolic panel    Standing Status:   Standing    Number of Occurrences:   1   CBC    Standing Status:   Standing    Number of Occurrences:   1   Comprehensive metabolic panel    Standing Status:   Standing    Number of Occurrences:   1   CBC    Standing Status:   Standing    Number of Occurrences:   1   Diet Heart Room service appropriate? Yes; Fluid consistency: Thin    Standing Status:   Standing    Number of Occurrences:   1    Order Specific  Question:   Room service appropriate?    Answer:   Yes    Order Specific Question:   Fluid consistency:    Answer:   Thin   Utilize spacer/aerochamber with mdi inhaler for COVID-19 positive patients or PUI for COVID-19    Standing Status:   Standing    Number of Occurrences:   1   If O2 Sat <94% administer O2 at 2 liters/minute via nasal cannula    Standing Status:   Standing    Number of Occurrences:   1   Cardiac monitoring    Cardiac monitoring when placed in a treatment room    Standing Status:   Standing    Number of Occurrences:   1   Maintain IV access    Standing Status:   Standing    Number of Occurrences:   1   Vital signs    Standing Status:   Standing    Number of Occurrences:   1   Notify physician (specify)    Standing Status:   Standing    Number of Occurrences:   20    Order Specific Question:   Notify Physician    Answer:   for pulse less than 55 or greater than 120    Order Specific Question:   Notify Physician    Answer:   for respiratory rate less than 12 or greater than 25    Order Specific Question:   Notify Physician    Answer:   for  temperature greater than 100.5 F    Order Specific Question:   Notify Physician    Answer:   for urinary output less than 30 mL/hr for four hours    Order Specific Question:   Notify Physician    Answer:   for systolic BP less than 90 or greater than 160, diastolic BP less than 60 or greater than 100    Order Specific Question:   Notify Physician    Answer:   for new hypoxia w/ oxygen saturations < 88%   Progressive Mobility Protocol: No Restrictions    Standing Status:   Standing    Number of Occurrences:   1   Daily weights    Standing Status:   Standing    Number of Occurrences:   1   Intake and Output    Standing Status:   Standing    Number of Occurrences:   1   Do not place and if present remove PureWick    Standing Status:   Standing    Number of Occurrences:   1   Initiate Oral Care Protocol    Standing Status:   Standing    Number of Occurrences:   1   Initiate Carrier Fluid Protocol    Standing Status:   Standing    Number of Occurrences:   1   RN may order General Admission PRN Orders utilizing "General Admission PRN medications" (through manage orders) for the following patient needs: allergy symptoms (Claritin), cold sores (Carmex), cough (Robitussin DM), eye irritation (Liquifilm Tears), hemorrhoids (Tucks), indigestion (Maalox), minor skin irritation (Hydrocortisone Cream), muscle pain (Ben Gay), nose irritation (saline nasal spray) and sore throat (Chloraseptic spray).    Standing Status:   Standing    Number of Occurrences:   [id number]   Cardiac Monitoring Continuous x 48 hours Indications for use: Other; Other indications for use: sepsis    Standing Status:   Standing    Number of Occurrences:   1  Order Specific Question:   Indications for use:    Answer:   Other    Order Specific Question:   Other indications for use:    Answer:   sepsis   Wound care    Standing Status:   Standing    Number of Occurrences:   1   Full code    Standing Status:   Standing     Number of Occurrences:   1    Order Specific Question:   By:    Answer:   Other   Consult to hospitalist    Standing Status:   Standing    Number of Occurrences:   1    Order Specific Question:   Place call to:    Answer:   hospitalist    Order Specific Question:   Reason for Consult    Answer:   Admit    Order Specific Question:   Diagnosis/Clinical Info for Consult:    Answer:   cellulitis   Pulse oximetry, continuous    Pulse oximetry when placed in a treatment room    Standing Status:   Standing    Number of Occurrences:   1   Pulse oximetry check with vital signs    Standing Status:   Standing    Number of Occurrences:   1   Oxygen therapy Mode or (Route): Nasal cannula; Liters Per Minute: 2; Keep 02 saturation: greater than 92 %    Standing Status:   Standing    Number of Occurrences:   20    Order Specific Question:   Mode or (Route)    Answer:   Nasal cannula    Order Specific Question:   Liters Per Minute    Answer:   2    Order Specific Question:   Keep 02 saturation    Answer:   greater than 92 %   ED EKG    Standing Status:   Standing    Number of Occurrences:   1    Order Specific Question:   Reason for Exam    Answer:   Shortness of breath   Type and screen    Standing Status:   Standing    Number of Occurrences:   1   Admit to Inpatient (patient's expected length of stay will be greater than 2 midnights or inpatient only procedure)    Standing Status:   Standing    Number of Occurrences:   1    Order Specific Question:   Hospital Area    Answer:   [facility] [[id number]]    Order Specific Question:   Level of Care    Answer:   Telemetry Cardiac [103]    Order Specific Question:   Covid Evaluation    Answer:   Asymptomatic - no recent exposure (last 10 days) testing not required    Order Specific Question:   Diagnosis    Answer:   Cellulitis of multiple sites of lower extremity [161096]    Order Specific Question:   Admitting Physician     Answer:   Darrold Junker    Order Specific Question:   Attending Physician    Answer:   Darrold Junker    Order Specific Question:   Certification:    Answer:   I certify this patient will need inpatient services for at least 2 midnights    Order Specific Question:   Estimated Length of Stay    Answer:  2   Aspiration precautions    Standing Status:   Standing    Number of Occurrences:   1   Fall precautions    Standing Status:   Standing    Number of Occurrences:   1    Meds ordered this encounter  Medications   albuterol (PROVENTIL) (2.5 MG/3ML) 0.083% nebulizer solution 3 mL   vancomycin (VANCOCIN) IVPB 1000 mg/200 mL premix    Order Specific Question:   Indication:    Answer:   Cellulitis   fentaNYL (SUBLIMAZE) injection 50 mcg   0.9 %  sodium chloride infusion   morphine (PF) 2 MG/ML injection 2 mg   morphine (PF) 2 MG/ML injection 1 mg   clopidogrel (PLAVIX) tablet 75 mg   gabapentin (NEURONTIN) capsule 400 mg   metoprolol succinate (TOPROL-XL) 24 hr tablet 25 mg   heparin injection 5,000 Units   sodium chloride flush (NS) 0.9 % injection 3 mL   0.9 %  sodium chloride infusion   OR Linked Order Group    acetaminophen (TYLENOL) tablet 650 mg    acetaminophen (TYLENOL) suppository 650 mg   hydrALAZINE (APRESOLINE) injection 5 mg   cefTRIAXone (ROCEPHIN) 1 g in sodium chloride 0.9 % 100 mL IVPB    Order Specific Question:   Antibiotic Indication:    Answer:   Cellulitis   pantoprazole (PROTONIX) injection 40 mg    Admission Imaging : CT TIBIA FIBULA RIGHT WO CONTRAST  Result Date: 02/21/2023 CLINICAL DATA:  Cellulitis EXAM: CT OF THE LOWER RIGHT EXTREMITY WITHOUT CONTRAST TECHNIQUE: Multidetector CT imaging of the right lower extremity was performed according to the standard protocol. RADIATION DOSE REDUCTION: This exam was performed according to the departmental dose-optimization program which includes automated exposure control, adjustment of the mA  and/or kV according to patient size and/or use of iterative reconstruction technique. COMPARISON:  None Available. FINDINGS: Bones/Joint/Cartilage No acute bony abnormality. Specifically, no fracture, subluxation, or dislocation. No bone destruction to suggest osteomyelitis. Ligaments Suboptimally assessed by CT. Muscles and Tendons Grossly unremarkable. Soft tissues Edema throughout the subcutaneous soft tissues. No soft tissue gas or radiopaque foreign body. IMPRESSION: No acute bony abnormality. Edema throughout the subcutaneous soft tissues. Electronically Signed   By: Charlett NoseKevin  Dover M.D.   On: 02/21/2023 00:42   DG Ankle Complete Right  Result Date: 02/20/2023 CLINICAL DATA:  Open wounds EXAM: RIGHT ANKLE - COMPLETE 3+ VIEW COMPARISON:  None Available. FINDINGS: No fracture or malalignment. Wounds or ulcers at the lower leg and ankle. No soft tissue emphysema. No periostitis or osseous destructive change IMPRESSION: No acute osseous abnormality. Wounds or ulcers at the lower leg and ankle. Electronically Signed   By: Jasmine PangKim  Fujinaga M.D.   On: 02/20/2023 21:48   DG Chest 2 View  Result Date: 02/20/2023 CLINICAL DATA:  Shortness of breath.  History of CHF. EXAM: CHEST - 2 VIEW COMPARISON:  Chest radiograph 02/14/2023. FINDINGS: The heart is mildly enlarged, unchanged. The upper mediastinal contours are stable. The lungs hyperinflated with flattening of the diaphragms, unchanged. There is no focal consolidation or pulmonary edema. There is no pleural effusion or pneumothorax. Overall, aeration is unchanged since the study from 02/14/2023 There is S shaped curvature of the thoracolumbar spine with age-indeterminate compression deformity of the L1 vertebral body. IMPRESSION: 1. Stable aeration of the lungs since 02/14/2023 with no new or worsening focal airspace disease. 2. Hyperinflated lungs consistent with underlying COPD. 3. S shaped curvature of the spine with age-indeterminate compression deformity of the  L1 vertebral body. Electronically Signed   By: Lesia Hausen M.D.   On: 02/20/2023 17:38   Physical Examination: Vitals:   02/20/23 2155 02/20/23 2200 02/20/23 2223 02/20/23 2230  BP: (!) 199/100 (!) 211/85  (!) 202/80  Pulse: 88     Temp:   98.1 F (36.7 C)   Resp: 17 14  15   Height:      Weight:      SpO2: 99%     TempSrc:   Oral   BMI (Calculated):       Physical Exam Vitals and nursing note reviewed.  Constitutional:      General: She is not in acute distress.    Appearance: She is ill-appearing. She is not toxic-appearing or diaphoretic.  HENT:     Head: Normocephalic and atraumatic.     Right Ear: Hearing and external ear normal.     Left Ear: Hearing and external ear normal.     Nose: Nose normal. No nasal deformity.     Mouth/Throat:     Lips: Pink.     Tongue: No lesions.  Eyes:     Extraocular Movements: Extraocular movements intact.  Cardiovascular:     Rate and Rhythm: Bradycardia present. Rhythm irregular.     Pulses: Normal pulses.     Heart sounds: Normal heart sounds.  Pulmonary:     Effort: Pulmonary effort is normal.     Breath sounds: Rales present.  Abdominal:     General: Bowel sounds are normal. There is no distension.     Palpations: Abdomen is soft. There is no mass.     Tenderness: There is no abdominal tenderness. There is no guarding.     Hernia: No hernia is present.  Musculoskeletal:     Right lower leg: Edema present.     Left lower leg: Edema present.  Skin:    General: Skin is warm.     Findings: Abrasion, erythema and wound present.     Comments: Lower extremity wounds   Neurological:     General: No focal deficit present.     Mental Status: She is alert. She is disoriented.     Cranial Nerves: Cranial nerves 2-12 are intact.     Motor: Motor function is intact.  Psychiatric:        Attention and Perception: Attention normal.        Mood and Affect: Mood normal.        Speech: Speech normal.        Behavior: Behavior normal.  Behavior is cooperative.        Cognition and Memory: Cognition normal.     Assessment and Plan: >> Cellulitis of mild blood sites of her lower extremity >> Acute systolic congestive heart failure >> CAD >> Hypertension >> Iron deficiency anemia >> Tobacco abuse >> AKI  * Cellulitis of multiple sites of lower extremity Pt has rt leg erythema and peeling and ulcer like areas on her whole right leg.  admission requested for cellulitis but we will get ct scan of her right leg . Pt received vancomycin in ed we will also add rocephin.  Pulse present in DP.   Acute systolic CHF (congestive heart failure) Mild pt has mild crackles in both lung bases. Clinically she is otherwise euvolemic.    Coronary artery disease of native artery of native heart with stable angina pectoris Will continue patient on metoprolol and hydralazine.  AKI (acute kidney injury) Lab Results  Component Value Date  CREATININE 1.10 (H) 02/20/2023   CREATININE 0.79 02/14/2023   CREATININE 0.85 06/14/2020  We will avoid contrast.  Renally dose and monitor.    Atrial fibrillation Pt has h/o a.fib. Currently bradycardia with PAC's.      Hypertension Vitals:   02/20/23 1647 02/20/23 1945 02/20/23 2000 02/20/23 2030  BP: 134/84 (!) 146/98 (!) 184/95 (!) 179/69   02/20/23 2105 02/20/23 2130 02/20/23 2155 02/20/23 2200  BP: (!) 165/77 (!) 187/137 (!) 199/100 (!) 211/85   02/20/23 2230  BP: (!) 202/80  PRN hydralazine. Cont metoprolol.   Iron deficiency anemia    Latest Ref Rng & Units 02/20/2023    5:00 PM 02/14/2023    2:11 PM 08/04/2020   12:09 PM  CBC  WBC 4.0 - 10.5 K/uL 9.1  8.6  7.1   Hemoglobin 12.0 - 15.0 g/dL 8.5  8.4  16.1   Hematocrit 36.0 - 46.0 % 27.2  28.0  32.6   Platelets 150 - 400 K/uL 608  460  304   Stable and chronic we will follow. Type and screen IV ppi.    Tobacco abuse Nicotine patch.    DVT prophylaxis:  Heparin    Code Status:  Full code.      02/20/2023     4:53 PM  Advanced Directives  Does Patient Have a Medical Advance Directive? Yes  Type of Estate agent of [place];Living will    Family Communication:  None.  Emergency Contact: Contact Information     Name Relation Home Work [place] Sister [phone]  [phone]   Valentino Saxon [phone]         Disposition Plan:  TBD  Consults: None  Admission status: Inpatient.   Unit / Expected LOS: Tele/ 2-3 days   Gertha Calkin MD [facility]  6 PM- 2 AM. [phone]( Pager ) Please use [email or web address] to contact the current [facility] MD  based on hours  and team and services or  You may call [phone] to contact current Assigned [facility] Attending/Consulting MD for this patient.  This number is the [facility] ADMIT/Consult system  and will be able to assist any patient in any [place] location .

## 2023-02-20 NOTE — ED Notes (Signed)
Blue top sent to lab also. 

## 2023-02-21 DIAGNOSIS — D62 Acute posthemorrhagic anemia: Secondary | ICD-10-CM

## 2023-02-21 DIAGNOSIS — K921 Melena: Secondary | ICD-10-CM | POA: Diagnosis not present

## 2023-02-21 DIAGNOSIS — K922 Gastrointestinal hemorrhage, unspecified: Secondary | ICD-10-CM | POA: Diagnosis not present

## 2023-02-21 DIAGNOSIS — I96 Gangrene, not elsewhere classified: Secondary | ICD-10-CM | POA: Diagnosis not present

## 2023-02-21 DIAGNOSIS — L0390 Cellulitis, unspecified: Secondary | ICD-10-CM

## 2023-02-21 DIAGNOSIS — I10 Essential (primary) hypertension: Secondary | ICD-10-CM

## 2023-02-21 DIAGNOSIS — Z87891 Personal history of nicotine dependence: Secondary | ICD-10-CM

## 2023-02-21 DIAGNOSIS — Z720 Tobacco use: Secondary | ICD-10-CM

## 2023-02-21 DIAGNOSIS — L03119 Cellulitis of unspecified part of limb: Secondary | ICD-10-CM | POA: Diagnosis not present

## 2023-02-21 DIAGNOSIS — N179 Acute kidney failure, unspecified: Secondary | ICD-10-CM

## 2023-02-21 DIAGNOSIS — I70263 Atherosclerosis of native arteries of extremities with gangrene, bilateral legs: Secondary | ICD-10-CM

## 2023-02-21 DIAGNOSIS — I480 Paroxysmal atrial fibrillation: Secondary | ICD-10-CM

## 2023-02-21 DIAGNOSIS — I5022 Chronic systolic (congestive) heart failure: Secondary | ICD-10-CM

## 2023-02-21 HISTORY — DX: Gastrointestinal hemorrhage, unspecified: K92.2

## 2023-02-21 HISTORY — DX: Melena: K92.1

## 2023-02-21 LAB — COMPREHENSIVE METABOLIC PANEL
ALT: 27 U/L (ref 0–44)
AST: 25 U/L (ref 15–41)
Albumin: 2.3 g/dL — ABNORMAL LOW (ref 3.5–5.0)
Alkaline Phosphatase: 117 U/L (ref 38–126)
Anion gap: 8 (ref 5–15)
BUN: 40 mg/dL — ABNORMAL HIGH (ref 8–23)
CO2: 25 mmol/L (ref 22–32)
Calcium: 7.9 mg/dL — ABNORMAL LOW (ref 8.9–10.3)
Chloride: 97 mmol/L — ABNORMAL LOW (ref 98–111)
Creatinine, Ser: 1.01 mg/dL — ABNORMAL HIGH (ref 0.44–1.00)
GFR, Estimated: 54 mL/min — ABNORMAL LOW (ref >60)
Glucose, Bld: 100 mg/dL — ABNORMAL HIGH (ref 70–99)
Potassium: 4.4 mmol/L (ref 3.5–5.1)
Sodium: 130 mmol/L — ABNORMAL LOW (ref 135–145)
Total Bilirubin: 0.7 mg/dL (ref 0.3–1.2)
Total Protein: 5.7 g/dL — ABNORMAL LOW (ref 6.5–8.1)

## 2023-02-21 LAB — CBC
HCT: 23.7 % — ABNORMAL LOW (ref 36.0–46.0)
Hemoglobin: 7.3 g/dL — ABNORMAL LOW (ref 12.0–15.0)
MCH: 26.1 pg (ref 26.0–34.0)
MCHC: 30.8 g/dL (ref 30.0–36.0)
MCV: 84.6 fL (ref 80.0–100.0)
Platelets: 459 10*3/uL — ABNORMAL HIGH (ref 150–400)
RBC: 2.8 MIL/uL — ABNORMAL LOW (ref 3.87–5.11)
RDW: 14.6 % (ref 11.5–15.5)
WBC: 11.6 10*3/uL — ABNORMAL HIGH (ref 4.0–10.5)
nRBC: 0 % (ref 0.0–0.2)

## 2023-02-21 LAB — TYPE AND SCREEN

## 2023-02-21 LAB — TROPONIN I (HIGH SENSITIVITY): Troponin I (High Sensitivity): 16 ng/L (ref <18)

## 2023-02-21 LAB — BPAM RBC: Unit Type and Rh: 5100

## 2023-02-21 LAB — PREPARE RBC (CROSSMATCH)

## 2023-02-21 LAB — ABO/RH: ABO/RH(D): O POS

## 2023-02-21 MED ORDER — SODIUM CHLORIDE 0.9 % IV SOLN
Freq: Once | INTRAVENOUS | Status: AC
  Filled 2023-02-21: qty 1000

## 2023-02-21 MED ORDER — SODIUM CHLORIDE 0.9 % IV SOLN
300.0000 mg | Freq: Once | INTRAVENOUS | Status: AC
  Administered 2023-02-21: 300 mg via INTRAVENOUS
  Filled 2023-02-21: qty 15

## 2023-02-21 MED ORDER — PANTOPRAZOLE SODIUM 40 MG IV SOLR
40.0000 mg | Freq: Two times a day (BID) | INTRAVENOUS | Status: DC
  Administered 2023-02-21 – 2023-02-28 (×17): 40 mg via INTRAVENOUS
  Filled 2023-02-21 (×18): qty 10

## 2023-02-21 MED ORDER — SODIUM CHLORIDE 0.9 % IV SOLN
1.0000 g | INTRAVENOUS | Status: AC
  Administered 2023-02-21 – 2023-02-24 (×5): 1 g via INTRAVENOUS
  Filled 2023-02-21 (×3): qty 10
  Filled 2023-02-21: qty 1
  Filled 2023-02-21: qty 10

## 2023-02-21 MED ORDER — ACETAMINOPHEN 325 MG PO TABS
650.0000 mg | ORAL_TABLET | Freq: Once | ORAL | Status: AC
  Administered 2023-02-21: 650 mg via ORAL
  Filled 2023-02-21: qty 2

## 2023-02-21 MED ORDER — ACETAMINOPHEN 325 MG PO TABS
650.0000 mg | ORAL_TABLET | Freq: Four times a day (QID) | ORAL | Status: DC | PRN
  Administered 2023-02-22 – 2023-02-23 (×2): 650 mg via ORAL
  Filled 2023-02-21 (×2): qty 2

## 2023-02-21 MED ORDER — FUROSEMIDE 10 MG/ML IJ SOLN
20.0000 mg | Freq: Once | INTRAMUSCULAR | Status: AC
  Administered 2023-02-21: 20 mg via INTRAVENOUS
  Filled 2023-02-21: qty 2

## 2023-02-21 MED ORDER — ACETAMINOPHEN 650 MG RE SUPP: 650.0000 mg | Freq: Four times a day (QID) | RECTAL | Status: DC | PRN

## 2023-02-21 MED ORDER — OXYCODONE HCL 5 MG PO TABS: 5.0000 mg | ORAL_TABLET | Freq: Four times a day (QID) | ORAL | Status: DC | PRN

## 2023-02-21 NOTE — Assessment & Plan Note (Addendum)
Creatinine 1.1 on presentation down to 0.67.

## 2023-02-21 NOTE — Assessment & Plan Note (Addendum)
IV Rocephin.

## 2023-02-21 NOTE — ED Notes (Signed)
PT cleaned of small amount of black tarry stool.  Bedside guaiac positive.  Clean linen and gown, pt dry at present.  Dr. Renae Gloss aware of guaiac positive result.

## 2023-02-21 NOTE — Assessment & Plan Note (Signed)
-  Nicotine patch 

## 2023-02-21 NOTE — Assessment & Plan Note (Signed)
Plavix and heparin subcutaneous injection discontinued.  GI consultation.  IV Protonix twice daily.  NPO.  Transfuse 1 unit of packed red blood cells for hemoglobin of 7.3.  Benefits and risks explained.

## 2023-02-21 NOTE — Consult Note (Signed)
[name] Repress, MD [address]  [address]  [place], [place] [address]  Main: [phone]  Fax: [phone] Pager: [phone]   Consultation  Referring Provider:     No ref. provider found Primary Care Physician:  Miki Kins, FNP Primary Gastroenterologist: Gentry Fitz        Reason for Consultation: Melena, symptomatic anemia  Date of Admission:  02/20/2023 Date of Consultation:  02/21/2023         HPI:   [name] [name] is a 88 y.o. female chronic tobacco use, A-fib, hypertension, coronary artery disease, NSTEMI on Plavix presented with 1 day history of black tarry stools and symptomatic anemia.  Patient denies any abdominal pain, nausea or vomiting.  She reports having 1 large black tarry stool yesterday and early this morning.  She reports generalized fatigue, exertional dyspnea.  Patient is recently started on doxycycline for cellulitis.  She is hemodynamically stable, she has chronic anemia since 2021 with normal MCV, labs in the ER revealed hemoglobin 7.3 compared to baseline between 9 and 10.  It was 8.4 on 4/5, thrombocytosis, elevated BUN/creatinine 43/1.1, normal few days ago, received 1 unit of PRBCs today, patient reported feeling better.  She denies any alcohol use, NSAID use, kept n.p.o., on Protonix 40 mg IV twice daily  NSAIDs: None  Antiplts/Anticoagulants/Anti thrombotics: Plavix for history of coronary artery disease  GI Procedures: None  Past Medical History:  Diagnosis Date   Atrial fibrillation    Basal cell carcinoma 2021   L temple   Chronic back pain    Congestive heart failure (CHF)    History of total hip replacement (Bilateral) 01/12/2020   Left hip replacement done in 2019.  Right hip replacement done in 2012.   Hypertension    Hyponatremia 06/11/2020   NSTEMI (non-ST elevated myocardial infarction) 06/11/2020   Pharmacologic therapy 01/12/2020   Problems influencing health status 01/12/2020   Scoliosis     Past Surgical  History:  Procedure Laterality Date   ABDOMINAL SURGERY     bilateral hip replacement     CORONARY STENT INTERVENTION N/A 06/12/2020   Procedure: CORONARY STENT INTERVENTION;  Surgeon: Iran Ouch, MD;  Location: [facility] INVASIVE CV LAB;  Service: Cardiovascular;  Laterality: N/A;  LAD   LEFT HEART CATH AND CORONARY ANGIOGRAPHY N/A 06/12/2020   Procedure: LEFT HEART CATH AND CORONARY ANGIOGRAPHY;  Surgeon: Iran Ouch, MD;  Location: [facility] INVASIVE CV LAB;  Service: Cardiovascular;  Laterality: N/A;   TONSILLECTOMY       Current Facility-Administered Medications:    0.9 %  sodium chloride infusion, , Intravenous, Continuous, Gertha Calkin, MD, Stopped at 02/21/23 1427   acetaminophen (TYLENOL) tablet 650 mg, 650 mg, Oral, Q6H PRN **OR** acetaminophen (TYLENOL) suppository 650 mg, 650 mg, Rectal, Q6H PRN, Renae Gloss, Richard, MD   albuterol (PROVENTIL) (2.5 MG/3ML) 0.083% nebulizer solution 3 mL, 3 mL, Nebulization, Q2H PRN, Irena Cords V, MD   cefTRIAXone (ROCEPHIN) 1 g in sodium chloride 0.9 % 100 mL IVPB, 1 g, Intravenous, Q24H, Gertha Calkin, MD, Stopped at 02/21/23 0320   gabapentin (NEURONTIN) capsule 400 mg, 400 mg, Oral, QID, Irena Cords V, MD, 400 mg at 02/21/23 1350   hydrALAZINE (APRESOLINE) injection 5 mg, 5 mg, Intravenous, Q4H PRN, Gertha Calkin, MD   iron sucrose (VENOFER) 300 mg in sodium chloride 0.9 % 250 mL IVPB, 300 mg, Intravenous, Once, Makynleigh Breslin, Loel Dubonnet, MD   metoprolol succinate (TOPROL-XL) 24 hr tablet 25 mg, 25  mg, Oral, Daily, Irena Cords V, MD, 25 mg at 02/21/23 1013   morphine (PF) 2 MG/ML injection 1 mg, 1 mg, Intravenous, Q8H PRN, Irena Cords V, MD, 1 mg at 02/21/23 0157   oxyCODONE (Oxy IR/ROXICODONE) immediate release tablet 5 mg, 5 mg, Oral, Q6H PRN, Renae Gloss, Richard, MD   pantoprazole (PROTONIX) injection 40 mg, 40 mg, Intravenous, Q12H, Irena Cords V, MD, 40 mg at 02/21/23 1013   sodium chloride flush (NS) 0.9 % injection 3 mL, 3 mL, Intravenous, Q12H,  Gertha Calkin, MD, 3 mL at 02/21/23 0243   Family History  Problem Relation Age of Onset   CVA Father    Heart disease Father    Arthritis/Rheumatoid Sister      Social History   Tobacco Use   Smoking status: Former    Packs/day: 0.50    Years: 68.00    Additional pack years: 0.00    Total pack years: 34.00    Types: Cigarettes    Quit date: 06/11/2020    Years since quitting: 2.6   Smokeless tobacco: Never  Substance Use Topics   Alcohol use: Not Currently   Drug use: Not Currently    Allergies as of 02/20/2023 - Review Complete 02/20/2023  Allergen Reaction Noted   Spironolactone Anaphylaxis 01/29/2023   Fluogen [influenza virus vaccine] Hives 01/29/2023   Statins  01/29/2023   Sulfa antibiotics Other (See Comments) 01/11/2020    Review of Systems:    All systems reviewed and negative except where noted in HPI.   Physical Exam:  Vital signs in last 24 hours: Temp:  [98 F (36.7 C)-98.3 F (36.8 C)] (P) 98.3 F (36.8 C) (04/12 1358) Pulse Rate:  [48-157] (P) 68 (04/12 1358) Resp:  [13-29] (P) 20 (04/12 1358) BP: (134-211)/(69-144) (P) 124/63 (04/12 1358) SpO2:  [92 %-100 %] 100 % (04/12 1000) Weight:  [50 kg] 50 kg (04/11 1651)   General:   Thin built, poorly nourished, cooperative in NAD Head:  Normocephalic and atraumatic. Eyes:   No icterus.   Conjunctiva pale. PERRLA. Ears:  Normal auditory acuity. Neck:  Supple; no masses or thyroidomegaly Lungs: Respirations even and unlabored. Lungs clear to auscultation bilaterally.   No wheezes, crackles, or rhonchi.  Heart:  Regular rate and rhythm;  Without murmur, clicks, rubs or gallops Abdomen:  Soft, nondistended, nontender. Normal bowel sounds. No appreciable masses or hepatomegaly.  No rebound or guarding.  Rectal:  Not performed. Msk:  Symmetrical without gross deformities.  Strength generalized weakness Extremities:  Without edema, cyanosis or clubbing. Neurologic:  Alert and oriented x3;  grossly normal  neurologically. Skin:  Intact without significant lesions or rashes. Psych:  Alert and cooperative. Normal affect.  LAB RESULTS:    Latest Ref Rng & Units 02/21/2023    4:12 AM 02/20/2023    5:00 PM 02/14/2023    2:11 PM  CBC  WBC 4.0 - 10.5 K/uL 11.6  9.1  8.6   Hemoglobin 12.0 - 15.0 g/dL 7.3  8.5  8.4   Hematocrit 36.0 - 46.0 % 23.7  27.2  28.0   Platelets 150 - 400 K/uL 459  608  460     BMET    Latest Ref Rng & Units 02/21/2023    4:12 AM 02/20/2023    5:00 PM 02/14/2023    2:11 PM  BMP  Glucose 70 - 99 mg/dL 007  622  633   BUN 8 - 23 mg/dL 40  43  15   Creatinine  0.44 - 1.00 mg/dL 1.61  0.96  0.45   Sodium 135 - 145 mmol/L 130  125  130   Potassium 3.5 - 5.1 mmol/L 4.4  4.4  4.6   Chloride 98 - 111 mmol/L 97  92  95   CO2 22 - 32 mmol/L Calcium 8.9 - 10.3 mg/dL 7.9  8.3  8.5     LFT    Latest Ref Rng & Units 02/21/2023    4:12 AM 02/14/2023    2:11 PM 06/11/2020   10:42 AM  Hepatic Function  Total Protein 6.5 - 8.1 g/dL 5.7  7.0  6.5   Albumin 3.5 - 5.0 g/dL 2.3  3.0  3.8   AST 15 - 41 U/L 25  31  40   ALT 0 - 44 U/L Alk Phosphatase 38 - 126 U/L 117  118  120   Total Bilirubin 0.3 - 1.2 mg/dL 0.7  0.6  0.9      STUDIES: CT TIBIA FIBULA RIGHT WO CONTRAST  Result Date: 02/21/2023 CLINICAL DATA:  Cellulitis EXAM: CT OF THE LOWER RIGHT EXTREMITY WITHOUT CONTRAST TECHNIQUE: Multidetector CT imaging of the right lower extremity was performed according to the standard protocol. RADIATION DOSE REDUCTION: This exam was performed according to the departmental dose-optimization program which includes automated exposure control, adjustment of the mA and/or kV according to patient size and/or use of iterative reconstruction technique. COMPARISON:  None Available. FINDINGS: Bones/Joint/Cartilage No acute bony abnormality. Specifically, no fracture, subluxation, or dislocation. No bone destruction to suggest osteomyelitis. Ligaments Suboptimally assessed by  CT. Muscles and Tendons Grossly unremarkable. Soft tissues Edema throughout the subcutaneous soft tissues. No soft tissue gas or radiopaque foreign body. IMPRESSION: No acute bony abnormality. Edema throughout the subcutaneous soft tissues. Electronically Signed   By: Charlett Nose M.D.   On: 02/21/2023 00:42   DG Ankle Complete Right  Result Date: 02/20/2023 CLINICAL DATA:  Open wounds EXAM: RIGHT ANKLE - COMPLETE 3+ VIEW COMPARISON:  None Available. FINDINGS: No fracture or malalignment. Wounds or ulcers at the lower leg and ankle. No soft tissue emphysema. No periostitis or osseous destructive change IMPRESSION: No acute osseous abnormality. Wounds or ulcers at the lower leg and ankle. Electronically Signed   By: Jasmine Pang M.D.   On: 02/20/2023 21:48   DG Chest 2 View  Result Date: 02/20/2023 CLINICAL DATA:  Shortness of breath.  History of CHF. EXAM: CHEST - 2 VIEW COMPARISON:  Chest radiograph 02/14/2023. FINDINGS: The heart is mildly enlarged, unchanged. The upper mediastinal contours are stable. The lungs hyperinflated with flattening of the diaphragms, unchanged. There is no focal consolidation or pulmonary edema. There is no pleural effusion or pneumothorax. Overall, aeration is unchanged since the study from 02/14/2023 There is S shaped curvature of the thoracolumbar spine with age-indeterminate compression deformity of the L1 vertebral body. IMPRESSION: 1. Stable aeration of the lungs since 02/14/2023 with no new or worsening focal airspace disease. 2. Hyperinflated lungs consistent with underlying COPD. 3. S shaped curvature of the spine with age-indeterminate compression deformity of the L1 vertebral body. Electronically Signed   By: Lesia Hausen M.D.   On: 02/20/2023 17:38      Impression / Plan:   [name] [name] is a 88 y.o. female with chronic tobacco use, coronary artery disease, NSTEMI on Plavix presented with severe symptomatic iron deficiency anemia and 1 day history of  melena  Melena and acute  blood loss anemia Last episode of black tarry stool was early this morning Hemodynamically stable Continue Protonix 40 mg IV twice daily Maintain 2 large-bore IVs Monitor CBC closely to maintain hemoglobin above 8 Plavix has been held since admission, recommend to hold at least for 72 hours before proceeding with upper endoscopy or sooner if patient demonstrates active GI bleed Low iron and B12, recommend parenteral iron therapy and B12 supplements daily Okay to resume diet  Thank you for involving me in the care of this patient.  Dr. Tobi Bastos will cover for the weekend    LOS: 1 day   Lannette Donath, MD  02/21/2023, 3:30 PM    Note: This dictation was prepared with Dragon dictation along with smaller phrase technology. Any transcriptional errors that result from this process are unintentional.

## 2023-02-21 NOTE — Assessment & Plan Note (Signed)
Left fourth toe.  Will get vascular consultation.  Unfortunately unable to do blood thinner with GI bleed.

## 2023-02-21 NOTE — Consult Note (Signed)
Hospital Consult    Reason for Consult:  Bilateral Lower extremity Gangrene toes.  Requesting Physician:  Dr Alford Highland MD MRN #:  [id number]  History of Present Illness: This is a 88 y.o. female 88 year old female with history of lower extremity wounds, PAD, CAD, atrial fibrillation, congestive heart failure presents with lower extremity wounds getting worse. Patient was given doxycycline a few days ago for cellulitis but has gotten worse. In the ER she was placed on Rocephin.   On exam today patient is resting comfortably in bed getting blood. Patient states her right lower extremity is painful at rest as well as when ambulating. Noted gangrene toes on bilateral feet with sores with scabs and thick skin.   Past Medical History:  Diagnosis Date   Atrial fibrillation    Basal cell carcinoma 2021   L temple   Chronic back pain    Congestive heart failure (CHF)    History of total hip replacement (Bilateral) 01/12/2020   Left hip replacement done in 2019.  Right hip replacement done in 2012.   Hypertension    Hyponatremia 06/11/2020   NSTEMI (non-ST elevated myocardial infarction) 06/11/2020   Pharmacologic therapy 01/12/2020   Problems influencing health status 01/12/2020   Scoliosis     Past Surgical History:  Procedure Laterality Date   ABDOMINAL SURGERY     bilateral hip replacement     CORONARY STENT INTERVENTION N/A 06/12/2020   Procedure: CORONARY STENT INTERVENTION;  Surgeon: Iran Ouch, MD;  Location: [facility] INVASIVE CV LAB;  Service: Cardiovascular;  Laterality: N/A;  LAD   LEFT HEART CATH AND CORONARY ANGIOGRAPHY N/A 06/12/2020   Procedure: LEFT HEART CATH AND CORONARY ANGIOGRAPHY;  Surgeon: Iran Ouch, MD;  Location: [facility] INVASIVE CV LAB;  Service: Cardiovascular;  Laterality: N/A;   TONSILLECTOMY      Allergies  Allergen Reactions   Spironolactone Anaphylaxis   Fluogen [Influenza Virus Vaccine] Hives   Statins     Dizziness & weakness   Sulfa  Antibiotics Other (See Comments)    Unknown, happened as a child    Prior to Admission medications   Medication Sig Start Date End Date Taking? Authorizing Provider  cephALEXin (KEFLEX) 500 MG capsule Take 500 mg by mouth 4 (four) times daily. 02/14/23  Yes [provider]  clopidogrel (PLAVIX) 75 MG tablet Take 1 tablet (75 mg total) by mouth daily with breakfast. 06/23/20  Yes Alver Sorrow, NP  doxycycline (VIBRAMYCIN) 50 MG capsule Take 2 capsules (100 mg total) by mouth 2 (two) times daily for 14 days. 02/14/23 02/28/23 Yes Pilar Jarvis, MD  gabapentin (NEURONTIN) 400 MG capsule Take 400 mg by mouth 4 (four) times daily.   Yes [provider]  lisinopril (ZESTRIL) 5 MG tablet TAKE 1 TABLET (5 MG TOTAL) BY MOUTH DAILY. NEED APPOINTMENT 02/12/23  Yes Miki Kins, FNP  meloxicam (MOBIC) 15 MG tablet TAKE 1 TABLET BY MOUTH EVERY DAY 02/18/23  Yes Miki Kins, FNP  metoprolol succinate (TOPROL-XL) 25 MG 24 hr tablet Take 25 mg by mouth daily. 10/24/22  Yes [provider]  oxyCODONE-acetaminophen (PERCOCET) 10-325 MG tablet Take 1 tablet by mouth every 6 (six) hours as needed for pain. 02/22/23  Yes Miki Kins, FNP  terbinafine (LAMISIL) 250 MG tablet Take 1 tablet (250 mg total) by mouth daily. 02/14/23 05/09/23 Yes Pilar Jarvis, MD    Social History   Socioeconomic History   Marital status: Single    Spouse name: Not on  file   Number of children: 0   Years of education: Not on file   Highest education level: Not on file  Occupational History   Not on file  Tobacco Use   Smoking status: Former    Packs/day: 0.50    Years: 68.00    Additional pack years: 0.00    Total pack years: 34.00    Types: Cigarettes    Quit date: 06/11/2020    Years since quitting: 2.6   Smokeless tobacco: Never  Substance and Sexual Activity   Alcohol use: Not Currently   Drug use: Not Currently   Sexual activity: Not on file  Other Topics Concern   Not on file   Social History Narrative   Not on file   Social Determinants of Health   Financial Resource Strain: Not on file  Food Insecurity: Not on file  Transportation Needs: No Transportation Needs (12/25/2022)   PRAPARE - Administrator, Civil Service (Medical): No    Lack of Transportation (Non-Medical): No  Physical Activity: Insufficiently Active (12/25/2022)   Exercise Vital Sign    Days of Exercise per Week: 7 days    Minutes of Exercise per Session: 20 min  Stress: Stress Concern Present (12/25/2022)   [employer] of Occupational Health - Occupational Stress Questionnaire    Feeling of Stress : Very much  Social Connections: Socially Isolated (12/25/2022)   Social Connection and Isolation Panel [NHANES]    Frequency of Communication with Friends and Family: Never    Frequency of Social Gatherings with Friends and Family: Never    Attends Religious Services: Never    Database administrator or Organizations: No    Attends Banker Meetings: Never    Marital Status: Never married  Intimate Partner Violence: Not At Risk (12/25/2022)   Humiliation, Afraid, Rape, and Kick questionnaire    Fear of Current or Ex-Partner: No    Emotionally Abused: No    Physically Abused: No    Sexually Abused: No     Family History  Problem Relation Age of Onset   CVA Father    Heart disease Father    Arthritis/Rheumatoid Sister     ROS: Otherwise negative unless mentioned in HPI  Physical Examination  Vitals:   02/21/23 1011 02/21/23 1358  BP:  (P) 124/63  Pulse:  (P) 68  Resp:  (P) 20  Temp: 98 F (36.7 C) (P) 98.3 F (36.8 C)  SpO2:     Body mass index is 18.92 kg/m.  General:  WDWN in NAD Gait: Not observed HENT: WNL, normocephalic Pulmonary: normal non-labored breathing, without Rales, rhonchi,  wheezing decreased bilateral lower lung sounds.  Cardiac: irregular, Hx of AFIB, without  Murmurs, rubs or gallops; without carotid bruits Abdomen: Positive  bowel sounds, soft, NT/ND, no masses Skin: without rashes Vascular Exam/Pulses: Palpable upper extremity pulses. Unable to palpate bilateral lower extremity pulses. No doppler available. Bilateral lower extremities cool to touch.  Extremities: with ischemic changes, with Gangrene , without cellulitis; with open wounds;  Musculoskeletal: no muscle wasting or atrophy  Neurologic: A&O X 3;  No focal weakness or paresthesias are detected; speech is fluent/normal Psychiatric:  The pt has Normal affect. Lymph:  Unremarkable  CBC    Component Value Date/Time   WBC 11.6 (H) 02/21/2023 0412   RBC 2.80 (L) 02/21/2023 0412   HGB 7.3 (L) 02/21/2023 0412   HGB 10.0 (L) 08/04/2020 1209   HCT 23.7 (L) 02/21/2023 0981  HCT 32.6 (L) 08/04/2020 1209   PLT 459 (H) 02/21/2023 0412   PLT 304 08/04/2020 1209   MCV 84.6 02/21/2023 0412   MCV 85 08/04/2020 1209   MCH 26.1 02/21/2023 0412   MCHC 30.8 02/21/2023 0412   RDW 14.6 02/21/2023 0412   RDW 12.7 08/04/2020 1209   LYMPHSABS 0.5 (L) 02/14/2023 1411   LYMPHSABS 1.0 08/04/2020 1209   MONOABS 0.7 02/14/2023 1411   EOSABS 0.0 02/14/2023 1411   EOSABS 0.0 08/04/2020 1209   BASOSABS 0.0 02/14/2023 1411   BASOSABS 0.0 08/04/2020 1209    BMET    Component Value Date/Time   NA 130 (L) 02/21/2023 0412   K 4.4 02/21/2023 0412   CL 97 (L) 02/21/2023 0412   CO2 25 02/21/2023 0412   GLUCOSE 100 (H) 02/21/2023 0412   BUN 40 (H) 02/21/2023 0412   CREATININE 1.01 (H) 02/21/2023 0412   CALCIUM 7.9 (L) 02/21/2023 0412   GFRNONAA 54 (L) 02/21/2023 0412   GFRAA >60 06/14/2020 0547    COAGS: Lab Results  Component Value Date   INR 1.1 02/14/2023   INR 1.1 06/11/2020     Non-Invasive Vascular Imaging:   Patient noted to have Venous dopplers of left lower extremities in June of 2023 without any DVT.   Statin:  No. Beta Blocker:  Yes.   Aspirin:  Yes.   ACEI:  Yes.   ARB:  No. CCB use:  No Other antiplatelets/anticoagulants:  Yes.   Plavix 75  mg Daily   ASSESSMENT/PLAN: This is a 88 y.o. female who presents to [facility] emergency department for worsening pain to bilateral lower extremities.  Started on doxycycline few days ago for cellulitis but she states that it it has not gotten any better.  She is noted to have bilateral gangrenous toes with open sores on bilateral lower extremities.  Both lower extremities are cool to touch.  Patient was also noted to have black tarry stools and upon workup determined she needed GI consultation for what appears to be an upper GI bleed.  Patient was on Plavix and heparin subcu injections which were stopped because of this bleeding.  She was given 1 unit of packed red blood cells for hemoglobin of 7.3.  Plan: Patient will be scheduled to go the vascular lab for a right lower extremity angiogram on Tuesday for 02/25/2023.  The difficulty in this patient needing any type of intervention is her GI bleeding which complicates her taking dual anticoagulation therapy if she needs stent placement in her lower extremities.  I discussed in detail with the patient the procedure, benefits, risks, and complications.  Verbalizes her understanding.  I answered all the patient's questions.  She is willing to proceed on Tuesday.  She will be made n.p.o. after midnight on Tuesday in preparation for the procedure.   -I discussed in detail the plan with Dr. Elbert Ewings MD and he is in agreement with the plan   Marcie [facility] 02/21/2023 3:23 PM

## 2023-02-21 NOTE — Assessment & Plan Note (Addendum)
Will continue patient on Toprol

## 2023-02-21 NOTE — Assessment & Plan Note (Addendum)
Paroxysmal in nature.  Holding Plavix with GI bleed.

## 2023-02-21 NOTE — Progress Notes (Signed)
Progress Note   Patient: [name] [name] ZOX:[id number] DOB: 10-16-1935 DOA: 02/20/2023     1 DOS: the patient was seen and examined on 02/21/2023   Brief hospital course: 88 year old female with history of lower extremity wounds, PAD, CAD, atrial fibrillation, congestive heart failure presents with lower extremity wounds getting worse.  Patient was given doxycycline a few days ago for cellulitis but has gotten worse.  In the ER she was placed on Rocephin.  4/12.  Called by nursing staff with black tar-like stool.  Hemoglobin 7.3.  I discontinued Plavix and heparin subcutaneous injections.  GI consultation.  Patient made n.p.o. and placed on Protonix IV twice daily.  Benefits and risks of blood transfusion explained to patient and family.  1 unit of packed red blood cells ordered.   Assessment and Plan: * Upper GI bleeding Plavix and heparin subcutaneous injection discontinued.  GI consultation.  IV Protonix twice daily.  NPO.  Transfuse 1 unit of packed red blood cells for hemoglobin of 7.3.  Benefits and risks explained.  Acute blood loss anemia Transfuse 1 unit of packed red blood cells for hemoglobin of 7.3.  Benefits and risks of transfusion explained to patient and family.  Holding Plavix and heparin subcutaneous injections.  Cellulitis of multiple sites of lower extremity IV Rocephin.  Toe necrosis Left fourth toe.  Will get vascular consultation.  Unfortunately unable to do blood thinner with GI bleed.  AKI (acute kidney injury) Baseline creatinine 0.79.  Creatinine 1.1 on presentation down to 1.01 today.  Atrial fibrillation Paroxysmal in nature.  Holding Plavix with GI bleed.    Coronary artery disease of native artery of native heart with stable angina pectoris Will continue patient on Toprol  Hypertension Continue Toprol   Tobacco abuse Nicotine patch.   Chronic systolic CHF (congestive heart failure) Watch closely on IV fluids.  Last echocardiogram back in 2021  showed an EF of 30%.        Subjective: Patient was admitted with right lower extremity cellulitis.  She has been battling lower extremity wounds for months.  I was called this morning secondary to black tar-like stools.  Patient has not had an appetite in 2 days as he has not eaten.  Physical Exam: Vitals:   02/21/23 0700 02/21/23 0800 02/21/23 1000 02/21/23 1011  BP: (!) 144/99 (!) 164/70 139/87   Pulse: (!) 48 (!) 157 98   Resp: (!) Temp:    98 F (36.7 C)  TempSrc:      SpO2: 92% 100% 100%   Weight:      Height:       Physical Exam HENT:     Head: Normocephalic.     Mouth/Throat:     Pharynx: No oropharyngeal exudate.  Eyes:     General: Lids are normal.     Conjunctiva/sclera: Conjunctivae normal.  Cardiovascular:     Rate and Rhythm: Normal rate and regular rhythm.     Heart sounds: Normal heart sounds, S1 normal and S2 normal.  Pulmonary:     Breath sounds: Examination of the right-lower field reveals decreased breath sounds. Examination of the left-lower field reveals decreased breath sounds. Decreased breath sounds present. No wheezing, rhonchi or rales.  Abdominal:     Palpations: Abdomen is soft.     Tenderness: There is no abdominal tenderness.  Musculoskeletal:     Right lower leg: No swelling.     Left lower leg: No swelling.  Skin:    Comments:  Left fourth toe either necrotic or fungus.  Between toes and on toes on of black material.  Neurological:     Mental Status: She is alert and oriented to person, place, and time.     Data Reviewed: Sodium 130, creatinine 1.01, BUN 40, hemoglobin 7.3, platelet count 459, white blood cell count 11.6  Family Communication: Spoke with sister at the bedside  Disposition: Status is: Inpatient Remains inpatient appropriate because: Treating for GI bleed.  Holding Plavix and giving Protonix IV.  Will transfuse 1 unit of packed red blood cells.  Vascular consult for questionable necrotic left fourth toe  versus fungus.  Planned Discharge Destination: To be determined    Time spent: 28 minutes  Author: Alford Highland, MD 02/21/2023 1:45 PM  For on call review [email or web address].

## 2023-02-21 NOTE — Consult Note (Addendum)
WOC Nurse Consult Note: Reason for Consult: Consult requested for BLE. Generalized partial thickness skin loss to bilat anterior/posterior ankles and calves, painful to touch.  Pt stats she has home health assistance but wounds have recently declined. She was referred to a podiatrist last week but did not have the chance to schedule an appointment before she was readmitted.  Left 4th toe is necrotic, dry eschar Left outer ankle with moist yellow full thickness wound; 1X1X.2cm and 3X1X.2cm Left inner  ankle with moist full thickness wound, 80% yellow, 10% red, 10% eschar, 4X2X.2cm, mod amt yellow drainage Right anterior and posterior calf with yellow moist macerated partial thickness skin loss around entire leg, mod amt yellow drainage Right inner ankle with full thickness wound, 70% yellow, 20% eschar, 10% red, mod amt yellow drainage. Secure chat message sent to primary team, "Pt could benefit from an ABI to assess perfusion and a vascular consult for necrotic 4th toe, please order if desired." Dressing procedure/placement/frequency: Topical treatment orders provided for bedside nurses to perform as follows: 1. Apply Xeroform gauze to left and right lower extremity wounds Q day, then cover with ABD pads and kerlex. 2. Paint left 4th toe with betadine Q day Please re-consult if further assistance is needed.  Thank-you,  Cammie Mcgee MSN, RN, CWOCN, [place], CNS [phone]

## 2023-02-21 NOTE — Assessment & Plan Note (Addendum)
Continue Toprol.  Add back lisinopril.

## 2023-02-21 NOTE — Assessment & Plan Note (Signed)
Watch closely on IV fluids.  Last echocardiogram back in 2021 showed an EF of 30%.

## 2023-02-21 NOTE — Assessment & Plan Note (Addendum)
Transfused 1 unit of packed red blood cells for hemoglobin of 7.3.  Hemoglobin up to 9.7.  Patient given IV Venofer yesterday.  Will give oral B12 supplementation.  Holding Plavix and heparin subcutaneous injections.

## 2023-02-22 DIAGNOSIS — L03119 Cellulitis of unspecified part of limb: Secondary | ICD-10-CM | POA: Diagnosis not present

## 2023-02-22 DIAGNOSIS — I25118 Atherosclerotic heart disease of native coronary artery with other forms of angina pectoris: Secondary | ICD-10-CM

## 2023-02-22 DIAGNOSIS — D62 Acute posthemorrhagic anemia: Secondary | ICD-10-CM | POA: Diagnosis not present

## 2023-02-22 DIAGNOSIS — I96 Gangrene, not elsewhere classified: Secondary | ICD-10-CM | POA: Diagnosis not present

## 2023-02-22 DIAGNOSIS — E871 Hypo-osmolality and hyponatremia: Secondary | ICD-10-CM

## 2023-02-22 DIAGNOSIS — K922 Gastrointestinal hemorrhage, unspecified: Secondary | ICD-10-CM | POA: Diagnosis not present

## 2023-02-22 LAB — TYPE AND SCREEN
ABO/RH(D): O POS
Antibody Screen: NEGATIVE
Unit division: 0

## 2023-02-22 LAB — CBC
HCT: 30 % — ABNORMAL LOW (ref 36.0–46.0)
Hemoglobin: 9.7 g/dL — ABNORMAL LOW (ref 12.0–15.0)
MCH: 26.9 pg (ref 26.0–34.0)
MCHC: 32.3 g/dL (ref 30.0–36.0)
MCV: 83.3 fL (ref 80.0–100.0)
Platelets: 428 10*3/uL — ABNORMAL HIGH (ref 150–400)
RBC: 3.6 MIL/uL — ABNORMAL LOW (ref 3.87–5.11)
RDW: 14.2 % (ref 11.5–15.5)
WBC: 8.9 10*3/uL (ref 4.0–10.5)
nRBC: 0 % (ref 0.0–0.2)

## 2023-02-22 LAB — BPAM RBC
Blood Product Expiration Date: 202405162359
ISSUE DATE / TIME: 202404121353

## 2023-02-22 LAB — BASIC METABOLIC PANEL
Anion gap: 9 (ref 5–15)
BUN: 28 mg/dL — ABNORMAL HIGH (ref 8–23)
CO2: 25 mmol/L (ref 22–32)
Calcium: 8 mg/dL — ABNORMAL LOW (ref 8.9–10.3)
Chloride: 100 mmol/L (ref 98–111)
Creatinine, Ser: 0.79 mg/dL (ref 0.44–1.00)
GFR, Estimated: >60 mL/min (ref >60)
Glucose, Bld: 100 mg/dL — ABNORMAL HIGH (ref 70–99)
Potassium: 3.4 mmol/L — ABNORMAL LOW (ref 3.5–5.1)
Sodium: 134 mmol/L — ABNORMAL LOW (ref 135–145)

## 2023-02-22 MED ORDER — POTASSIUM CHLORIDE CRYS ER 20 MEQ PO TBCR
40.0000 meq | EXTENDED_RELEASE_TABLET | Freq: Once | ORAL | Status: AC
  Administered 2023-02-22: 40 meq via ORAL
  Filled 2023-02-22: qty 2

## 2023-02-22 MED ORDER — OXYCODONE HCL 5 MG PO TABS
5.0000 mg | ORAL_TABLET | Freq: Four times a day (QID) | ORAL | Status: DC | PRN
  Administered 2023-02-22 – 2023-02-23 (×2): 5 mg via ORAL
  Filled 2023-02-22 (×2): qty 1

## 2023-02-22 MED ORDER — VITAMIN B-12 1000 MCG PO TABS
1000.0000 ug | ORAL_TABLET | Freq: Every day | ORAL | Status: DC
  Administered 2023-02-22 – 2023-03-03 (×10): 1000 ug via ORAL
  Filled 2023-02-22 (×10): qty 1

## 2023-02-22 MED ORDER — TERBINAFINE HCL 250 MG PO TABS
250.0000 mg | ORAL_TABLET | Freq: Every day | ORAL | Status: DC
  Filled 2023-02-22: qty 1

## 2023-02-22 MED ORDER — TERBINAFINE HCL 250 MG PO TABS
125.0000 mg | ORAL_TABLET | Freq: Every day | ORAL | Status: DC
  Administered 2023-02-23 – 2023-03-03 (×9): 125 mg via ORAL
  Filled 2023-02-22 (×9): qty 1

## 2023-02-22 NOTE — Plan of Care (Signed)

## 2023-02-22 NOTE — Evaluation (Signed)
Physical Therapy Evaluation Patient Details Name: [name] [name] MRN: [id number] DOB: 15-Apr-1935 Today's Date: 02/22/2023  History of Present Illness  Pt  is a 88 y.o. female 88 year old female with history of lower extremity wounds, PAD, CAD, atrial fibrillation, congestive heart failure presents with lower extremity wounds getting worse. Patient was given doxycycline a few days ago for cellulitis but has gotten worse. In the ER she was placed on Rocephin.    Clinical Impression  Pt received in Semi-Fowler's position and agreeable to therapy.  Pt self-reports that she is extremely fatigued due to not eating for 3 days.  Pt's lunch arrived to room and pt looking forward to eating following therapy session.  Pt has good bed mobility, however requires verbal cuing for hand placement to stand and maybe minA from therapist to come upright.  Pt with forward flexed posture that pt is unable to correct when in standing.  Pt mobilizes well and was able to ambulate around the nursing station and back to the room.  Pt currently demonstrates significant weakness that will be address by skilled therapy in order to improve function and assist with returning to PLOF.  Due to pt living alone, having little to no caregiver support, and required to climb 9 steps to get into apartment, pt would benefit from having increased therapy during hospital stay.  Safety concerns present and will be managed throughout future therapy sessions.       Recommendations for follow up therapy are one component of a multi-disciplinary discharge planning process, led by the attending physician.  Recommendations may be updated based on patient status, additional functional criteria and insurance authorization.  Follow Up Recommendations Can patient physically be transported by private vehicle: Yes     Assistance Recommended at Discharge Intermittent Supervision/Assistance  Patient can return home with the following  A lot of help with  walking and/or transfers;A little help with bathing/dressing/bathroom;Assistance with cooking/housework;Assist for transportation;Help with stairs or ramp for entrance    Equipment Recommendations None recommended by PT  Recommendations for Other Services       Functional Status Assessment Patient has had a recent decline in their functional status and demonstrates the ability to make significant improvements in function in a reasonable and predictable amount of time.     Precautions / Restrictions Precautions Precautions: None Restrictions Weight Bearing Restrictions: No      Mobility  Bed Mobility Overal bed mobility: Modified Independent             General bed mobility comments: pt slow but able to transfer to sitting without assistance    Transfers Overall transfer level: Needs assistance Equipment used: Rolling walker (2 wheels) Transfers: Sit to/from Stand Sit to Stand: Min guard           General transfer comment: verbal cuing for correct technique with one hand on walker, one hand on bed.    Ambulation/Gait Ambulation/Gait assistance: Min guard Gait Distance (Feet): 160 Feet Assistive device: Rolling walker (2 wheels) Gait Pattern/deviations: Step-through pattern, Trunk flexed Gait velocity: decreased     General Gait Details: Pt ambulates with forward flexed posture and allows walker to get away.  Pt advised to stay close, however pt somewhat defiant and would not ambulate with proper form.  Stairs            Wheelchair Mobility    Modified Rankin (Stroke Patients Only)       Balance Overall balance assessment: Needs assistance Sitting-balance support: Bilateral upper extremity supported, Feet supported  Sitting balance-Leahy Scale: Poor   Postural control: Posterior lean Standing balance support: Bilateral upper extremity supported, During functional activity, Reliant on assistive device for balance Standing balance-Leahy Scale:  Poor                               Pertinent Vitals/Pain Pain Assessment Pain Assessment: No/denies pain    Home Living Family/patient expects to be discharged to:: Private residence Living Arrangements: Alone Available Help at Discharge: Family;Other (Comment) (Pt's sister and BIL were helping previously, but he has terminal cancer and she feels as though she cannot ask her sister to help her at this time.) Type of Home: Apartment Home Access: Stairs to enter Entrance Stairs-Rails: Doctor, general practice of Steps: 9   Home Layout: One level Home Equipment:  (Rolling walker with 4 Wheels)      Prior Function Prior Level of Function : Independent/Modified Independent                     Hand Dominance   Dominant Hand: Right    Extremity/Trunk Assessment   Upper Extremity Assessment Upper Extremity Assessment: Generalized weakness    Lower Extremity Assessment Lower Extremity Assessment: Generalized weakness    Cervical / Trunk Assessment Cervical / Trunk Assessment: Kyphotic  Communication   Communication: HOH  Cognition Arousal/Alertness: Awake/alert Behavior During Therapy: WFL for tasks assessed/performed Overall Cognitive Status: Within Functional Limits for tasks assessed                                          General Comments      Exercises     Assessment/Plan    PT Assessment Patient needs continued PT services  PT Problem List Decreased strength;Decreased activity tolerance;Decreased balance;Decreased mobility;Decreased knowledge of use of DME;Decreased safety awareness       PT Treatment Interventions DME instruction;Gait training;Stair training;Functional mobility training;Therapeutic activities;Therapeutic exercise;Balance training;Neuromuscular re-education    PT Goals (Current goals can be found in the Care Plan section)  Acute Rehab PT Goals Patient Stated Goal: to get strength back and  transition out of her apartment. PT Goal Formulation: With patient Time For Goal Achievement: 03/08/23 Potential to Achieve Goals: Good    Frequency Min 5X/week     Co-evaluation               AM-PAC PT "6 Clicks" Mobility  Outcome Measure Help needed turning from your back to your side while in a flat bed without using bedrails?: A Little Help needed moving from lying on your back to sitting on the side of a flat bed without using bedrails?: A Little Help needed moving to and from a bed to a chair (including a wheelchair)?: A Little Help needed standing up from a chair using your arms (e.g., wheelchair or bedside chair)?: A Lot Help needed to walk in hospital room?: A Little Help needed climbing 3-5 steps with a railing? : Total 6 Click Score: 15    End of Session Equipment Utilized During Treatment: Gait belt Activity Tolerance: Patient tolerated treatment well Patient left: in bed Nurse Communication: Mobility status PT Visit Diagnosis: Unsteadiness on feet (R26.81);Other abnormalities of gait and mobility (R26.89);Muscle weakness (generalized) (M62.81);Difficulty in walking, not elsewhere classified (R26.2);Adult, failure to thrive (R62.7)    Time: 0175-1025 PT Time Calculation (min) (ACUTE ONLY): 20 min  Charges:   PT Evaluation $PT Eval Low Complexity: 1 Low PT Treatments $Therapeutic Activity: 8-22 mins        Nolon Bussing, PT, DPT Physical Therapist - [facility]  [facility]  02/22/23, 2:13 PM

## 2023-02-22 NOTE — Progress Notes (Signed)
PHARMACY NOTE:  ANTIMICROBIAL RENAL DOSAGE ADJUSTMENT  Current antimicrobial regimen includes a mismatch between antimicrobial dosage and estimated renal function.  As per policy approved by the Pharmacy & Therapeutics and Medical Executive Committees, the antimicrobial dosage will be adjusted accordingly.  Current antimicrobial dosage:  Terbinafine 250 mg PO daily  Indication: Fungal nail infection  Renal Function:  Estimated Creatinine Clearance: 36.6 mL/min (by C-G formula based on SCr of 0.79 mg/dL).   Antimicrobial dosage has been changed to:  Terbinafine 125 mg PO daily  Additional comments:   Thank you for allowing pharmacy to be a part of this patient's care.  Orson Aloe, [facility] 02/22/2023 1:23 PM

## 2023-02-22 NOTE — Assessment & Plan Note (Signed)
Sodium 125 on presentation and up to 134

## 2023-02-22 NOTE — Progress Notes (Signed)
Progress Note   Patient: [name] [name] SAY:[id number] DOB: October 09, 1935 DOA: 02/20/2023     2 DOS: the patient was seen and examined on 02/22/2023   Brief hospital course: 88 year old female with history of lower extremity wounds, PAD, CAD, atrial fibrillation, congestive heart failure presents with lower extremity wounds getting worse.  Patient was given doxycycline a few days ago for cellulitis but has gotten worse.  In the ER she was placed on Rocephin.  4/12.  Called by nursing staff with black tar-like stool.  Hemoglobin 7.3.  I discontinued Plavix and heparin subcutaneous injections.  GI consultation.  Patient made n.p.o. and placed on Protonix IV twice daily.  Benefits and risks of blood transfusion explained to patient and family.  1 unit of packed red blood cells ordered. 4/13.  Responded well to 1 unit of packed red blood cells.  Hemoglobin up to 9.7.  Still having brown-black bowel movements.  Holding Plavix.  Angiogram lower extremity scheduled for Tuesday.  Endoscopy potentially on Monday.  Assessment and Plan: * Upper GI bleeding Plavix and heparin subcutaneous injection discontinued.  GI consultation.  IV Protonix twice daily.  NPO.  Transfuse 1 unit of packed red blood cells for hemoglobin of 7.3.  Benefits and risks explained.  Acute blood loss anemia Transfuse 1 unit of packed red blood cells for hemoglobin of 7.3.  Benefits and risks of transfusion explained to patient and family.  Holding Plavix and heparin subcutaneous injections.  Cellulitis of multiple sites of lower extremity IV Rocephin.  Toe necrosis Left fourth toe.  Will get vascular consultation.  Unfortunately unable to do blood thinner with GI bleed.  AKI (acute kidney injury) Baseline creatinine 0.79.  Creatinine 1.1 on presentation down to 1.01 today.  Atrial fibrillation Paroxysmal in nature.  Holding Plavix with GI bleed.    Coronary artery disease of native artery of native heart with stable angina  pectoris Will continue patient on Toprol  Hypertension Continue Toprol   Tobacco abuse Nicotine patch.   Chronic systolic CHF (congestive heart failure) Watch closely on IV fluids.  Last echocardiogram back in 2021 showed an EF of 30%.        Subjective: Patient feels okay.  Asking how long she has to stay in the hospital.  Had 5 light brown bowel movements since I saw her yesterday.  Initially admitted with cellulitis and has GI bleed.  Physical Exam: Vitals:   02/22/23 0500 02/22/23 0632 02/22/23 0746 02/22/23 1117  BP:  (!) 185/83 (!) 133/46 123/68  Pulse:  70 78 71  Resp:   20 20  Temp:   97.9 F (36.6 C) (!) 97.5 F (36.4 C)  TempSrc:      SpO2:   98% 100%  Weight: 45.9 kg     Height:       Physical Exam HENT:     Head: Normocephalic.     Mouth/Throat:     Pharynx: No oropharyngeal exudate.  Eyes:     General: Lids are normal.     Conjunctiva/sclera: Conjunctivae normal.  Cardiovascular:     Rate and Rhythm: Normal rate and regular rhythm.     Heart sounds: Normal heart sounds, S1 normal and S2 normal.  Pulmonary:     Breath sounds: Examination of the right-lower field reveals decreased breath sounds. Examination of the left-lower field reveals decreased breath sounds. Decreased breath sounds present. No wheezing, rhonchi or rales.  Abdominal:     Palpations: Abdomen is soft.     Tenderness: There  is no abdominal tenderness.  Musculoskeletal:     Right lower leg: No swelling.     Left lower leg: No swelling.  Skin:    Comments: Left fourth toe either necrotic or fungus.  Between toes and on toes black material seen.  Numerous toenail fungus.  Neurological:     Mental Status: She is alert and oriented to person, place, and time.     Data Reviewed: Sodium 134, potassium 3.4, creatinine 0.79, hemoglobin 9.7, platelet count 428  Family Communication: Updated patient's daughter on the phone  Disposition: Status is: Inpatient Remains inpatient  appropriate because: Will have endoscopy on Monday and angiogram on Tuesday.  Planned Discharge Destination: To be determined based on physical therapy evaluation.  Patient lives by herself.    Time spent: 28 minutes  Author: Alford Highland, MD 02/22/2023 12:24 PM  For on call review [email or web address].

## 2023-02-23 DIAGNOSIS — K922 Gastrointestinal hemorrhage, unspecified: Secondary | ICD-10-CM | POA: Diagnosis not present

## 2023-02-23 DIAGNOSIS — D62 Acute posthemorrhagic anemia: Secondary | ICD-10-CM | POA: Diagnosis not present

## 2023-02-23 DIAGNOSIS — L03119 Cellulitis of unspecified part of limb: Secondary | ICD-10-CM | POA: Diagnosis not present

## 2023-02-23 DIAGNOSIS — I96 Gangrene, not elsewhere classified: Secondary | ICD-10-CM | POA: Diagnosis not present

## 2023-02-23 LAB — BASIC METABOLIC PANEL
Anion gap: 7 (ref 5–15)
BUN: 17 mg/dL (ref 8–23)
CO2: 24 mmol/L (ref 22–32)
Calcium: 8 mg/dL — ABNORMAL LOW (ref 8.9–10.3)
Chloride: 101 mmol/L (ref 98–111)
Creatinine, Ser: 0.67 mg/dL (ref 0.44–1.00)
GFR, Estimated: >60 mL/min (ref >60)
Glucose, Bld: 138 mg/dL — ABNORMAL HIGH (ref 70–99)
Potassium: 3.5 mmol/L (ref 3.5–5.1)
Sodium: 132 mmol/L — ABNORMAL LOW (ref 135–145)

## 2023-02-23 LAB — HEMOGLOBIN: Hemoglobin: 9.3 g/dL — ABNORMAL LOW (ref 12.0–15.0)

## 2023-02-23 LAB — MAGNESIUM: Magnesium: 1.8 mg/dL (ref 1.7–2.4)

## 2023-02-23 NOTE — Progress Notes (Signed)
[name] [name] , MD [address], [place], [place], [address] [address], [place], [place], [address] Phone: [phone]  Fax: [phone]   Shayle Donahoo is being followed for upper GI bleed   Subjective: No further bleeding    Objective: Vital signs in last 24 hours: Vitals:   02/23/23 0347 02/23/23 0500 02/23/23 0732 02/23/23 1118  BP: (!) 153/72  (!) 153/79 139/69  Pulse: 93  73 (!) 59  Resp: Temp: 97.8 F (36.6 C)  98 F (36.7 C) (!) 97.4 F (36.3 C)  TempSrc: Oral  Oral   SpO2: 100%  97% 100%  Weight:  42.7 kg    Height:       Weight change: -3.2 kg  Intake/Output Summary (Last 24 hours) at 02/23/2023 1139 Last data filed at 02/23/2023 1057 Gross per 24 hour  Intake 640 ml  Output --  Net 640 ml     Exam: She was on the comode having a bowel movement .   Neuro: Ax0 x 3 not in pain or distress, moving all four limbs . No gross focal neurological deficit     Lab Results: @ Micro Results: Recent Results (from the past 240 hour(s))  Culture, blood (Routine x 2)     Status: None   Collection Time: 02/14/23  2:11 PM   Specimen: BLOOD  Result Value Ref Range Status   Specimen Description BLOOD LEFT [facility]  Final   Special Requests   Final    BOTTLES DRAWN AEROBIC AND ANAEROBIC Blood Culture adequate volume   Culture   Final    NO GROWTH 5 DAYS Performed at [facility], [address] [place], [place] [address]    Report Status 02/19/2023 FINAL  Final   Studies/Results: No results found. Medications: I have reviewed the patient's current medications. Scheduled Meds:  vitamin B-12  1,000 mcg Oral Daily   gabapentin  400 mg Oral QID   metoprolol succinate  25 mg Oral Daily   pantoprazole (PROTONIX) IV  40 mg Intravenous Q12H   sodium chloride flush  3 mL Intravenous Q12H   terbinafine  125 mg Oral Daily   Continuous Infusions:  cefTRIAXone (ROCEPHIN)  IV Stopped (02/23/23 0004)   PRN  Meds:.acetaminophen **OR** acetaminophen, albuterol, hydrALAZINE, oxyCODONE   Assessment: Principal Problem:   Upper GI bleeding Active Problems:   Hyponatremia   Tobacco abuse   Hypertension   Atrial fibrillation   Coronary artery disease of native artery of native heart with stable angina pectoris   Cellulitis of multiple sites of lower extremity   AKI (acute kidney injury)   Acute blood loss anemia   Toe necrosis   Chronic systolic CHF (congestive heart failure)   Melena   Cellulitis   [name] 88 y.o. female with a history of peripheral artery disease, CAD atrial fibrillation on Plavix noted to have tarry black stools on 02/21/2023 Plavix and heparin were discontinued . Dr. Allegra Lai was consulted noted to have elevated BUN/creatinine ratio concerning for upper GI bleed plan to perform EGD once off Plavix for at least 3 days. Hemoglobin has been stable.  Plan: Monitor CBC transfuse as needed IV PPI EGD tomorrow 3 days of Plavix with Dr Servando Snare   I have discussed alternative options, risks & benefits,  which include, but are not limited to, bleeding, infection, perforation,respiratory complication & drug reaction.  The patient agrees with this plan & written consent will be obtained.  LOS: 3 days   [name] Mood, MD 02/23/2023, 11:39 AM

## 2023-02-23 NOTE — Progress Notes (Signed)
Physical Therapy Treatment Patient Details Name: [name] [name] MRN: [id number] DOB: 08/27/1935 Today's Date: 02/23/2023   History of Present Illness Pt  is a 88 y.o. female 88 year old female with history of lower extremity wounds, PAD, CAD, atrial fibrillation, congestive heart failure presents with lower extremity wounds getting worse. Patient was given doxycycline a few days ago for cellulitis but has gotten worse. In the ER she was placed on Rocephin.    PT Comments    Pt ready for session.  She is able to get in and out of bed with contact guard.  Steady in sitting but some LOB initially upon sitting due to poorly controlled descent.  She is able to walk x 1 lap on unit with generally poor walker placement and gait quality.  Cued to step up into walker but resists "this is the only way I can do it."  Encouraged to use bathroom to void but declined stating she prefers BSC.  She asks for help with her care but is encouraged to do it on her own which she is able to do.  Pt voicing that nursing staff does not attend to her personal care and "they make me do it too."  Education provided about continuing to do tasks on her own as she is able to maintain strength and functional independence.  She was able to provide her own care without difficulty when given a cloth and cues.  Pt remains generally unsafe with mobility on her own and remains at an increased risk for falls is she walks unassisted at this time.  Was independent prior to admission per pt.     Recommendations for follow up therapy are one component of a multi-disciplinary discharge planning process, led by the attending physician.  Recommendations may be updated based on patient status, additional functional criteria and insurance authorization.  Follow Up Recommendations       Assistance Recommended at Discharge Intermittent Supervision/Assistance  Patient can return home with the following A little help with walking and/or transfers;A  little help with bathing/dressing/bathroom;Assistance with cooking/housework;Assist for transportation;Help with stairs or ramp for entrance   Equipment Recommendations  None recommended by PT    Recommendations for Other Services       Precautions / Restrictions Precautions Precautions: None Restrictions Weight Bearing Restrictions: No     Mobility  Bed Mobility Overal bed mobility: Modified Independent               Patient Response: Cooperative  Transfers Overall transfer level: Needs assistance Equipment used: Rolling walker (2 wheels) Transfers: Sit to/from Stand Sit to Stand: Min guard                Ambulation/Gait Ambulation/Gait assistance: Min Chemical engineer (Feet): 160 Feet Assistive device: Rolling walker (2 wheels) Gait Pattern/deviations: Step-through pattern, Trunk flexed Gait velocity: decreased     General Gait Details: poor RW positioning and ovreall gait quality   Stairs             Wheelchair Mobility    Modified Rankin (Stroke Patients Only)       Balance Overall balance assessment: Needs assistance Sitting-balance support: Bilateral upper extremity supported, Feet supported Sitting balance-Leahy Scale: Fair     Standing balance support: Bilateral upper extremity supported, During functional activity, Reliant on assistive device for balance Standing balance-Leahy Scale: Poor  Cognition Arousal/Alertness: Awake/alert Behavior During Therapy: WFL for tasks assessed/performed Overall Cognitive Status: Within Functional Limits for tasks assessed                                          Exercises Other Exercises Other Exercises: to [facility] to void.  offered bathroom but she opts for [facility]  asks for help for care but encouraged to do on her own.    General Comments        Pertinent Vitals/Pain Pain Assessment Pain Assessment: No/denies pain    Home  Living                          Prior Function            PT Goals (current goals can now be found in the care plan section) Progress towards PT goals: Progressing toward goals    Frequency    Min 5X/week      PT Plan Current plan remains appropriate    Co-evaluation              AM-PAC PT "6 Clicks" Mobility   Outcome Measure  Help needed turning from your back to your side while in a flat bed without using bedrails?: None Help needed moving from lying on your back to sitting on the side of a flat bed without using bedrails?: A Little Help needed moving to and from a bed to a chair (including a wheelchair)?: A Little Help needed standing up from a chair using your arms (e.g., wheelchair or bedside chair)?: A Little Help needed to walk in hospital room?: A Little Help needed climbing 3-5 steps with a railing? : A Lot 6 Click Score: 18    End of Session Equipment Utilized During Treatment: Gait belt Activity Tolerance: Patient tolerated treatment well Patient left: in bed;with bed alarm set;with call bell/phone within reach Nurse Communication: Mobility status PT Visit Diagnosis: Unsteadiness on feet (R26.81);Other abnormalities of gait and mobility (R26.89);Muscle weakness (generalized) (M62.81);Difficulty in walking, not elsewhere classified (R26.2);Adult, failure to thrive (R62.7)     Time: 9381-8299 PT Time Calculation (min) (ACUTE ONLY): 12 min  Charges:  $Gait Training: 8-22 mins                   Danielle Dess, PTA 02/23/23, 1:00 PM

## 2023-02-23 NOTE — Progress Notes (Signed)
Progress Note   Patient: [name] [name] GQB:[id number] DOB: 11/09/1935 DOA: 02/20/2023     3 DOS: the patient was seen and examined on 02/23/2023   Brief hospital course: 88 year old female with history of lower extremity wounds, PAD, CAD, atrial fibrillation, congestive heart failure presents with lower extremity wounds getting worse.  Patient was given doxycycline a few days ago for cellulitis but has gotten worse.  In the ER she was placed on Rocephin.  4/12.  Called by nursing staff with black tar-like stool.  Hemoglobin 7.3.  I discontinued Plavix and heparin subcutaneous injections.  GI consultation.  Patient made n.p.o. and placed on Protonix IV twice daily.  Benefits and risks of blood transfusion explained to patient and family.  1 unit of packed red blood cells ordered. 4/13.  Responded well to 1 unit of packed red blood cells.  Hemoglobin up to 9.7.  Still having brown-black bowel movements.  Holding Plavix.  Angiogram lower extremity scheduled for Tuesday.  Endoscopy potentially on Monday.  Assessment and Plan: * Upper GI bleeding Plavix and heparin subcutaneous injection discontinued on 4/12.  Last dose of Plavix on 4/11.  Potential endoscopy on Monday.  IV Protonix twice daily.  Transfused 1 unit of packed red blood cells for hemoglobin of 7.3 and last hemoglobin 9.3.  Acute blood loss anemia Transfused 1 unit of packed red blood cells for hemoglobin of 7.3.  Hemoglobin up to 9.3.  Patient given IV Venofer.  Will give oral B12 supplementation.  Holding Plavix and heparin subcutaneous injections.  Cellulitis of multiple sites of lower extremity IV Rocephin.  Toe necrosis Left fourth toe.  Vascular scheduled for lower extremity angiogram on Tuesday.  Unfortunately unable to do blood thinner with GI bleed.  Looks like on Lamisil as outpatient just in case this is fungal infection  AKI (acute kidney injury) Creatinine 1.1 on presentation down to 0.67 today.  Atrial  fibrillation Paroxysmal in nature.  Holding Plavix with GI bleed.    Coronary artery disease of native artery of native heart with stable angina pectoris Will continue patient on Toprol  Hypertension Continue Toprol   Tobacco abuse Nicotine patch.   Chronic systolic CHF (congestive heart failure) No current signs of heart failure.  Last echocardiogram back in 2021 showed an EF of 30%.  Hyponatremia Sodium 125 on presentation and up to 132 today.        Subjective: Patient feels okay.  She states that her last bowel movement was brown.  Nursing staff noted brown-black.  Hemoglobin relatively stable.  Admitted with lower extremity cellulitis and found to have GI bleed  Physical Exam: Vitals:   02/23/23 0347 02/23/23 0500 02/23/23 0732 02/23/23 1118  BP: (!) 153/72  (!) 153/79 139/69  Pulse: 93  73 (!) 59  Resp: 18  18 18   Temp: 97.8 F (36.6 C)  98 F (36.7 C) (!) 97.4 F (36.3 C)  TempSrc: Oral  Oral   SpO2: 100%  97% 100%  Weight:  42.7 kg    Height:       Physical Exam HENT:     Head: Normocephalic.     Mouth/Throat:     Pharynx: No oropharyngeal exudate.  Eyes:     General: Lids are normal.     Conjunctiva/sclera: Conjunctivae normal.  Cardiovascular:     Rate and Rhythm: Normal rate and regular rhythm.     Heart sounds: Normal heart sounds, S1 normal and S2 normal.  Pulmonary:     Breath sounds: Examination  of the right-lower field reveals decreased breath sounds. Examination of the left-lower field reveals decreased breath sounds. Decreased breath sounds present. No wheezing, rhonchi or rales.  Abdominal:     Palpations: Abdomen is soft.     Tenderness: There is no abdominal tenderness.  Musculoskeletal:     Right lower leg: No swelling.     Left lower leg: No swelling.  Skin:    Comments: Left fourth toe either necrotic or fungus.  Between toes and on toes black material seen.  Numerous toenail fungus.  Neurological:     Mental Status: She is  alert and oriented to person, place, and time.     Data Reviewed: Sodium 132, potassium 3.5, creatinine 0.67, hemoglobin 9.3  Family Communication: Left message for patient's sister  Disposition: Status is: Inpatient Remains inpatient appropriate because: EGD tomorrow and angiogram on Tuesday  Planned Discharge Destination: Rehab recommended by physical therapy but not sure if patient wants to go to rehab    Time spent: 28 minutes  Author: Alford Highland, MD 02/23/2023 12:31 PM  For on call review [email or web address].

## 2023-02-23 NOTE — [place] FL2 (Signed)
[place] MEDICAID FL2 LEVEL OF CARE FORM     IDENTIFICATION  Patient Name: [name] [name] Birthdate: June 17, 1935 Sex: female Admission Date (Current Location): 02/20/2023  [facility] and [place] Number:  Chiropodist and Address:  [facility], [address] [place], [place] [address]      Provider Number: [id number]  Attending Physician Name and Address:  Alford Highland, MD  Relative Name and Phone Number:  Barbarann Ehlers (Sister) [phone]    Current Level of Care: Hospital Recommended Level of Care: Skilled Nursing Facility Prior Approval Number:    Date Approved/Denied:   PASRR Number: Pending  Discharge Plan: SNF    Current Diagnoses: Patient Active Problem List   Diagnosis Date Noted   Upper GI bleeding 02/21/2023   Acute blood loss anemia 02/21/2023   Toe necrosis 02/21/2023   Chronic systolic CHF (congestive heart failure) 02/21/2023   Melena 02/21/2023   Cellulitis 02/21/2023   Cellulitis of multiple sites of lower extremity 02/20/2023   AKI (acute kidney injury) 02/20/2023   Coronary artery disease of native artery of native heart with stable angina pectoris 02/05/2023   Atrial fibrillation 08/16/2021   Protein-calorie malnutrition, severe 06/13/2020   NSTEMI (non-ST elevated myocardial infarction) 06/11/2020   Hyponatremia 06/11/2020   Tobacco abuse 06/11/2020   Chronic low back pain (1ry area of Pain) (Bilateral) (R>L) w/ sciatica (Bilateral) 01/12/2020   Chronic lower extremity pain (2ry area of Pain) (Bilateral) (R>L) 01/12/2020   DDD (degenerative disc disease), lumbosacral 01/12/2020   Walker as ambulation aid 01/12/2020   Chronic pain syndrome 01/12/2020   Pain in joint, pelvic region and thigh 01/05/2020   Idiopathic scoliosis and kyphoscoliosis 12/03/2019   Hypertension 08/02/2019   Vitamin D deficiency 08/02/2019   Peripheral autonomic neuropathy 08/02/2019   Hyperlipidemia 08/02/2019    Orientation  RESPIRATION BLADDER Height & Weight     Self, Time, Situation, Place  Normal Continent Weight: 42.7 kg Height:  5\' 4"  (162.6 cm)  BEHAVIORAL SYMPTOMS/MOOD NEUROLOGICAL BOWEL NUTRITION STATUS      Continent Diet  AMBULATORY STATUS COMMUNICATION OF NEEDS Skin   Limited Assist   Other (Comment), Skin abrasions (lower extremity wounds, necrotic toe)                       Personal Care Assistance Level of Assistance  Bathing, Dressing Bathing Assistance: Limited assistance   Dressing Assistance: Limited assistance     Functional Limitations Info             SPECIAL CARE FACTORS FREQUENCY  PT (By licensed PT), OT (By licensed OT)     PT Frequency: 5 times a week OT Frequency: 5 times a week            Contractures Contractures Info: Not present    Additional Factors Info  Code Status, Allergies Code Status Info: Full Allergies Info: Spironolactone High  Anaphylaxis  Fluogen (influenza Virus Vaccine) Not Specified  Hives  Statins Not Specified   Dizziness & weakness Sulfa Antibiotics           Current Medications (02/23/2023):  This is the current hospital active medication list Current Facility-Administered Medications  Medication Dose Route Frequency Provider Last Rate Last Admin   acetaminophen (TYLENOL) tablet 650 mg  650 mg Oral Q6H PRN Alford Highland, MD   650 mg at 02/22/23 2058   Or   acetaminophen (TYLENOL) suppository 650 mg  650 mg Rectal Q6H PRN Alford Highland, MD  albuterol (PROVENTIL) (2.5 MG/3ML) 0.083% nebulizer solution 3 mL  3 mL Nebulization Q2H PRN Gertha Calkin, MD       cefTRIAXone (ROCEPHIN) 1 g in sodium chloride 0.9 % 100 mL IVPB  1 g Intravenous Q24H Gertha Calkin, MD   Stopped at 02/23/23 0004   cyanocobalamin (VITAMIN B12) tablet 1,000 mcg  1,000 mcg Oral Daily Alford Highland, MD   1,000 mcg at 02/23/23 1610   gabapentin (NEURONTIN) capsule 400 mg  400 mg Oral QID Irena Cords V, MD   400 mg at 02/23/23 1239   hydrALAZINE  (APRESOLINE) injection 5 mg  5 mg Intravenous Q4H PRN Gertha Calkin, MD   5 mg at 02/22/23 0649   metoprolol succinate (TOPROL-XL) 24 hr tablet 25 mg  25 mg Oral Daily Gertha Calkin, MD   25 mg at 02/23/23 9604   oxyCODONE (Oxy IR/ROXICODONE) immediate release tablet 5 mg  5 mg Oral Q6H PRN Alford Highland, MD   5 mg at 02/22/23 2058   pantoprazole (PROTONIX) injection 40 mg  40 mg Intravenous Q12H Irena Cords V, MD   40 mg at 02/23/23 0926   sodium chloride flush (NS) 0.9 % injection 3 mL  3 mL Intravenous Q12H Irena Cords V, MD   3 mL at 02/23/23 0926   terbinafine (LAMISIL) tablet 125 mg  125 mg Oral Daily Orson Aloe, [facility]   125 mg at 02/23/23 5409     Discharge Medications: Please see discharge summary for a list of discharge medications.  Relevant Imaging Results:  Relevant Lab Results:   Additional Information SSN - unknown, patient will obtain  Kemper Durie, RN

## 2023-02-23 NOTE — TOC Initial Note (Signed)
Transition of Care [facility]) - Initial/Assessment Note    Patient Details  Name: [name] [name] MRN: [id number] Date of Birth: 22-Jan-1935  Transition of [facility]) CM/SW Contact:    Kemper Durie, RN Phone Number: 02/23/2023, 4:43 PM  Clinical Narrative:                  Patient admitted from home, lives alone.  Grayling Congress is PCP.  Aware of recommendations for SNF for rehab, agrees but asks to call sister for further details and instructions.  Spoke with sister [name] [name], agrees to rehab, no preference but would like to stay close to [place].  Patient and sister do not know patient's SSN, unable to obtain PASRR today, but patient state she will get information.  FL2 done, will need to update with SSN and PASRR number once obtained.  Bed search initiated.   Expected Discharge Plan: Skilled Nursing Facility Barriers to Discharge: Continued Medical Work up   Patient Goals and CMS Choice Patient states their goals for this hospitalization and ongoing recovery are:: SNF for STR   Choice offered to / list presented to : Patient      Expected Discharge Plan and Services     Post Acute Care Choice: Skilled Nursing Facility Living arrangements for the past 2 months: Apartment                                      Prior Living Arrangements/Services Living arrangements for the past 2 months: Apartment Lives with:: Self              Current home services: DME    Activities of Daily Living      Permission Sought/Granted                  Emotional Assessment              Admission diagnosis:  Cellulitis of multiple sites of lower extremity [L03.119] Patient Active Problem List   Diagnosis Date Noted   Upper GI bleeding 02/21/2023   Acute blood loss anemia 02/21/2023   Toe necrosis 02/21/2023   Chronic systolic CHF (congestive heart failure) 02/21/2023   Melena 02/21/2023   Cellulitis 02/21/2023   Cellulitis of multiple sites of lower extremity 02/20/2023   AKI  (acute kidney injury) 02/20/2023   Coronary artery disease of native artery of native heart with stable angina pectoris 02/05/2023   Atrial fibrillation 08/16/2021   Protein-calorie malnutrition, severe 06/13/2020   NSTEMI (non-ST elevated myocardial infarction) 06/11/2020   Hyponatremia 06/11/2020   Tobacco abuse 06/11/2020   Chronic low back pain (1ry area of Pain) (Bilateral) (R>L) w/ sciatica (Bilateral) 01/12/2020   Chronic lower extremity pain (2ry area of Pain) (Bilateral) (R>L) 01/12/2020   DDD (degenerative disc disease), lumbosacral 01/12/2020   Walker as ambulation aid 01/12/2020   Chronic pain syndrome 01/12/2020   Pain in joint, pelvic region and thigh 01/05/2020   Idiopathic scoliosis and kyphoscoliosis 12/03/2019   Hypertension 08/02/2019   Vitamin D deficiency 08/02/2019   Peripheral autonomic neuropathy 08/02/2019   Hyperlipidemia 08/02/2019   PCP:  Miki Kins, FNP Pharmacy:   [facility] [phone] [place], [place] - [address] [place] [address] Phone: [phone] Fax: [phone]     Social Determinants of Health (SDOH) Social History: SDOH Screenings   Housing: Low Risk  (12/25/2022)  Transportation Needs: No Transportation Needs (12/25/2022)  Depression (PHQ2-9): High Risk (12/25/2022)  Physical Activity: Insufficiently Active (12/25/2022)  Social Connections: Socially Isolated (12/25/2022)  Stress: Stress Concern Present (12/25/2022)  Tobacco Use: Medium Risk (02/20/2023)   SDOH Interventions:     Readmission Risk Interventions     No data to display

## 2023-02-24 ENCOUNTER — Inpatient Hospital Stay: Payer: 59 | Admitting: Anesthesiology

## 2023-02-24 ENCOUNTER — Encounter: Payer: Self-pay | Admitting: Internal Medicine

## 2023-02-24 ENCOUNTER — Encounter
Admission: EM | Disposition: A | Discharge status: Skilled Nursing Facility | Payer: Self-pay | Source: Home / Self Care | Attending: Internal Medicine

## 2023-02-24 DIAGNOSIS — L03115 Cellulitis of right lower limb: Secondary | ICD-10-CM | POA: Diagnosis not present

## 2023-02-24 DIAGNOSIS — K31819 Angiodysplasia of stomach and duodenum without bleeding: Secondary | ICD-10-CM | POA: Diagnosis not present

## 2023-02-24 DIAGNOSIS — L03119 Cellulitis of unspecified part of limb: Secondary | ICD-10-CM | POA: Diagnosis not present

## 2023-02-24 DIAGNOSIS — I96 Gangrene, not elsewhere classified: Secondary | ICD-10-CM | POA: Diagnosis not present

## 2023-02-24 DIAGNOSIS — K921 Melena: Secondary | ICD-10-CM | POA: Diagnosis not present

## 2023-02-24 DIAGNOSIS — K922 Gastrointestinal hemorrhage, unspecified: Secondary | ICD-10-CM | POA: Diagnosis not present

## 2023-02-24 DIAGNOSIS — D62 Acute posthemorrhagic anemia: Secondary | ICD-10-CM | POA: Diagnosis not present

## 2023-02-24 HISTORY — PX: ESOPHAGOGASTRODUODENOSCOPY (EGD) WITH PROPOFOL: SHX5813

## 2023-02-24 LAB — HEMOGLOBIN: Hemoglobin: 9.5 g/dL — ABNORMAL LOW (ref 12.0–15.0)

## 2023-02-24 SURGERY — ESOPHAGOGASTRODUODENOSCOPY (EGD) WITH PROPOFOL
Anesthesia: General

## 2023-02-24 MED ORDER — PROPOFOL 10 MG/ML IV BOLUS
INTRAVENOUS | Status: DC | PRN
  Administered 2023-02-24: 20 mg via INTRAVENOUS
  Administered 2023-02-24 (×3): 10 mg via INTRAVENOUS
  Administered 2023-02-24: 30 mg via INTRAVENOUS

## 2023-02-24 MED ORDER — LIDOCAINE HCL (CARDIAC) PF 100 MG/5ML IV SOSY
PREFILLED_SYRINGE | INTRAVENOUS | Status: DC | PRN
  Administered 2023-02-24: 20 mg via INTRAVENOUS

## 2023-02-24 MED ORDER — PHENYLEPHRINE 80 MCG/ML (10ML) SYRINGE FOR IV PUSH (FOR BLOOD PRESSURE SUPPORT)
PREFILLED_SYRINGE | INTRAVENOUS | Status: DC | PRN
  Administered 2023-02-24: 80 ug via INTRAVENOUS

## 2023-02-24 MED ORDER — LISINOPRIL 10 MG PO TABS
10.0000 mg | ORAL_TABLET | Freq: Every day | ORAL | Status: DC
  Administered 2023-02-24 – 2023-03-03 (×8): 10 mg via ORAL
  Filled 2023-02-24 (×8): qty 1

## 2023-02-24 MED ORDER — SODIUM CHLORIDE 0.9 % IV SOLN: INTRAVENOUS | Status: DC

## 2023-02-24 MED ORDER — CHLORHEXIDINE GLUCONATE 4 % EX SOLN
60.0000 mL | Freq: Once | CUTANEOUS | Status: AC
  Administered 2023-02-24: 4 via TOPICAL

## 2023-02-24 MED ORDER — CHLORHEXIDINE GLUCONATE 4 % EX SOLN
60.0000 mL | Freq: Once | CUTANEOUS | Status: AC
  Administered 2023-02-25: 4 via TOPICAL

## 2023-02-24 NOTE — Progress Notes (Signed)
Progress Note    02/24/2023 8:58 AM Day of Surgery  Subjective:   This is a 88 y.o. female 88-year-old female with history of lower extremity wounds, PAD, CAD, atrial fibrillation, congestive heart failure presents with lower extremity wounds getting worse. Patient was given doxycycline a few days ago for cellulitis but has gotten worse. In the ER she was placed on Rocephin.    On exam today patient is resting comfortably in bed getting blood. Patient states her right lower extremity is painful at rest as well as when ambulating. Noted gangrene toes on bilateral feet with sores with scabs and thick skin.    Vitals:   02/24/23 0438 02/24/23 0806  BP: (!) 154/80 (!) 167/84  Pulse: 88 (!) 57  Resp: 16 18  Temp: 97.7 F (36.5 C) 98.4 F (36.9 C)  SpO2: 98% 100%   Physical Exam: Cardiac:  Irregular HX of AFIB, without murmurs rubs or gallops. Lungs: Nonlabored breathing without rales rhonchi.  Continued wheezing decreased bilateral lower lung sounds Incisions:  none Extremities: Palpable upper extremity pulses.  Unable to palpate bilateral lower extremity pulses.  No Doppler available.  Bilateral lower extremities are cool to touch. Abdomen: Positive bowel sounds, soft, flat, nontender, nondistended. Neurologic: And oriented x 3, no focal weakness or paresthesias detected.  Speech is fluent.  Follows all commands appropriately.  CBC    Component Value Date/Time   WBC 8.9 02/22/2023 0536   RBC 3.60 (L) 02/22/2023 0536   HGB 9.3 (L) 02/23/2023 0740   HGB 10.0 (L) 08/04/2020 1209   HCT 30.0 (L) 02/22/2023 0536   HCT 32.6 (L) 08/04/2020 1209   PLT 428 (H) 02/22/2023 0536   PLT 304 08/04/2020 1209   MCV 83.3 02/22/2023 0536   MCV 85 08/04/2020 1209   MCH 26.9 02/22/2023 0536   MCHC 32.3 02/22/2023 0536   RDW 14.2 02/22/2023 0536   RDW 12.7 08/04/2020 1209   LYMPHSABS 0.5 (L) 02/14/2023 1411   LYMPHSABS 1.0 08/04/2020 1209   MONOABS 0.7 02/14/2023 1411   EOSABS 0.0 02/14/2023  1411   EOSABS 0.0 08/04/2020 1209   BASOSABS 0.0 02/14/2023 1411   BASOSABS 0.0 08/04/2020 1209    BMET    Component Value Date/Time   NA 132 (L) 02/23/2023 0740   K 3.5 02/23/2023 0740   CL 101 02/23/2023 0740   CO2 24 02/23/2023 0740   GLUCOSE 138 (H) 02/23/2023 0740   BUN 17 02/23/2023 0740   CREATININE 0.67 02/23/2023 0740   CALCIUM 8.0 (L) 02/23/2023 0740   GFRNONAA >60 02/23/2023 0740   GFRAA >60 06/14/2020 0547    INR    Component Value Date/Time   INR 1.1 02/14/2023 1411     Intake/Output Summary (Last 24 hours) at 02/24/2023 0858 Last data filed at 02/23/2023 1057 Gross per 24 hour  Intake 240 ml  Output --  Net 240 ml     Assessment/Plan:  88 y.o. 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  Day of Surgery   Plan: Patient will be taken to the vascular lab on Tuesday, 02/25/2023 for right lower extremity gram with possible intervention.  This morning again I discussed in detail with the patient the procedure, benefits, complications, risks.  Patient again verbalizes her understanding.  I answered all the patient's questions this morning.  She would like to proceed as soon as possible.  Patient will be made n.p.o. after midnight on Monday.  DVT prophylaxis: Plavix 75 mg daily.   Kapono Luhn R Katlin Ciszewski   [facility] 02/24/2023 8:58 AM

## 2023-02-24 NOTE — Discharge Instructions (Signed)

## 2023-02-24 NOTE — H&P (View-Only) (Signed)
Progress Note    02/24/2023 8:58 AM Day of Surgery  Subjective:   This is a 88 y.o. female 88 year old female with history of lower extremity wounds, PAD, CAD, atrial fibrillation, congestive heart failure presents with lower extremity wounds getting worse. Patient was given doxycycline a few days ago for cellulitis but has gotten worse. In the ER she was placed on Rocephin.    On exam today patient is resting comfortably in bed getting blood. Patient states her right lower extremity is painful at rest as well as when ambulating. Noted gangrene toes on bilateral feet with sores with scabs and thick skin.    Vitals:   02/24/23 0438 02/24/23 0806  BP: (!) 154/80 (!) 167/84  Pulse: 88 (!) 57  Resp: 16 18  Temp: 97.7 F (36.5 C) 98.4 F (36.9 C)  SpO2: 98% 100%   Physical Exam: Cardiac:  Irregular HX of AFIB, without murmurs rubs or gallops. Lungs: Nonlabored breathing without rales rhonchi.  Continued wheezing decreased bilateral lower lung sounds Incisions:  none Extremities: Palpable upper extremity pulses.  Unable to palpate bilateral lower extremity pulses.  No Doppler available.  Bilateral lower extremities are cool to touch. Abdomen: Positive bowel sounds, soft, flat, nontender, nondistended. Neurologic: And oriented x 3, no focal weakness or paresthesias detected.  Speech is fluent.  Follows all commands appropriately.  CBC    Component Value Date/Time   WBC 8.9 02/22/2023 0536   RBC 3.60 (L) 02/22/2023 0536   HGB 9.3 (L) 02/23/2023 0740   HGB 10.0 (L) 08/04/2020 1209   HCT 30.0 (L) 02/22/2023 0536   HCT 32.6 (L) 08/04/2020 1209   PLT 428 (H) 02/22/2023 0536   PLT 304 08/04/2020 1209   MCV 83.3 02/22/2023 0536   MCV 85 08/04/2020 1209   MCH 26.9 02/22/2023 0536   MCHC 32.3 02/22/2023 0536   RDW 14.2 02/22/2023 0536   RDW 12.7 08/04/2020 1209   LYMPHSABS 0.5 (L) 02/14/2023 1411   LYMPHSABS 1.0 08/04/2020 1209   MONOABS 0.7 02/14/2023 1411   EOSABS 0.0 02/14/2023  1411   EOSABS 0.0 08/04/2020 1209   BASOSABS 0.0 02/14/2023 1411   BASOSABS 0.0 08/04/2020 1209    BMET    Component Value Date/Time   NA 132 (L) 02/23/2023 0740   K 3.5 02/23/2023 0740   CL 101 02/23/2023 0740   CO2 24 02/23/2023 0740   GLUCOSE 138 (H) 02/23/2023 0740   BUN 17 02/23/2023 0740   CREATININE 0.67 02/23/2023 0740   CALCIUM 8.0 (L) 02/23/2023 0740   GFRNONAA >60 02/23/2023 0740   GFRAA >60 06/14/2020 0547    INR    Component Value Date/Time   INR 1.1 02/14/2023 1411     Intake/Output Summary (Last 24 hours) at 02/24/2023 0858 Last data filed at 02/23/2023 1057 Gross per 24 hour  Intake 240 ml  Output --  Net 240 ml     Assessment/Plan:  88 y.o. 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  Day of Surgery   Plan: Patient will be taken to the vascular lab on Tuesday, 02/25/2023 for right lower extremity gram with possible intervention.  This morning again I discussed in detail with the patient the procedure, benefits, complications, risks.  Patient again verbalizes her understanding.  I answered all the patient's questions this morning.  She would like to proceed as soon as possible.  Patient will be made n.p.o. after midnight on Monday.  DVT prophylaxis: Plavix 75 mg daily.   Marcie Bal  [facility] 02/24/2023 8:58 AM

## 2023-02-24 NOTE — Anesthesia Postprocedure Evaluation (Signed)
Anesthesia Post Note  Patient: [name] [name]  Procedure(s) Performed: ESOPHAGOGASTRODUODENOSCOPY (EGD) WITH PROPOFOL  Patient location during evaluation: Endoscopy Anesthesia Type: General Level of consciousness: awake and alert Pain management: pain level controlled Vital Signs Assessment: post-procedure vital signs reviewed and stable Respiratory status: spontaneous breathing, nonlabored ventilation, respiratory function stable and patient connected to nasal cannula oxygen Cardiovascular status: blood pressure returned to baseline and stable Postop Assessment: no apparent nausea or vomiting Anesthetic complications: no   No notable events documented.   Last Vitals:  Vitals:   02/24/23 1250 02/24/23 1300  BP: (!) 189/78 (!) 178/84  Pulse: (!) 51 (!) 113  Resp: 18 20  Temp:    SpO2: 100% 100%    Last Pain:  Vitals:   02/24/23 1300  TempSrc:   PainSc: 0-No pain                 Louie Boston

## 2023-02-24 NOTE — Anesthesia Procedure Notes (Signed)
Procedure Name: MAC Date/Time: 02/24/2023 12:13 PM  Performed by: Hezzie Bump, CRNAPre-anesthesia Checklist: Patient identified, Emergency Drugs available, Suction available and Patient being monitored Patient Re-evaluated:Patient Re-evaluated prior to induction Oxygen Delivery Method: Nasal cannula Induction Type: IV induction Placement Confirmation: positive ETCO2

## 2023-02-24 NOTE — Progress Notes (Signed)
Progress Note   Patient: [name] [name] ZOX:[id number] DOB: [birth year] DOA: 02/20/2023     4 DOS: the patient was seen and examined on 02/24/2023   Brief hospital course: 88 year old female with history of lower extremity wounds, PAD, CAD, atrial fibrillation, congestive heart failure presents with lower extremity wounds getting worse.  Patient was given doxycycline a few days ago for cellulitis but has gotten worse.  In the ER she was placed on Rocephin.  4/12.  Called by nursing staff with black tar-like stool.  Hemoglobin 7.3.  I discontinued Plavix and heparin subcutaneous injections.  GI consultation.  Patient made n.p.o. and placed on Protonix IV twice daily.  Benefits and risks of blood transfusion explained to patient and family.  1 unit of packed red blood cells ordered. 4/13.  Responded well to 1 unit of packed red blood cells.  Hemoglobin up to 9.7.  Still having brown-black bowel movements.  Holding Plavix.  Angiogram lower extremity scheduled for Tuesday.  Endoscopy potentially on Monday. 4/14.  Hemoglobin 9.3 4/15.  Hemoglobin 9.5.  EGD showing multiple angiodysplastic lesions that were cauterized.  No active bleeding seen.  Assessment and Plan: * Upper GI bleeding Plavix and heparin subcutaneous injection discontinued on 4/12.  Last dose of Plavix on 4/11.  EGD showing multiple angiodysplastic lesions that were cauterized.  IV Protonix twice daily.  Transfused 1 unit of packed red blood cells for hemoglobin of 7.3 on 4/12.  And last hemoglobin 9.5.  Acute blood loss anemia Transfused 1 unit of packed red blood cells for hemoglobin of 7.3 on 4/12.  Hemoglobin up to 9.5.  Patient given IV Venofer.  Will give oral B12 supplementation.  Holding Plavix and heparin subcutaneous injections.  Cellulitis of multiple sites of lower extremity IV Rocephin.  Toe necrosis Left fourth toe.  Vascular scheduled for lower extremity angiogram on Tuesday.  Unfortunately unable to do blood thinner  with GI bleed.  Looks like on Lamisil as outpatient just in case this is fungal infection  AKI (acute kidney injury) Creatinine 1.1 on presentation down to 0.67.  Atrial fibrillation Paroxysmal in nature.  Holding Plavix with GI bleed.  Continue Toprol    Coronary artery disease of native artery of native heart with stable angina pectoris Will continue patient on Toprol  Hypertension Continue Toprol.  Add back lisinopril.   Tobacco abuse Nicotine patch.   Chronic systolic CHF (congestive heart failure) No current signs of heart failure.  Last echocardiogram back in 2021 showed an EF of 30%.  Hyponatremia Sodium 125 on presentation and up to 132.        Subjective: Patient seen this morning.  She stated she had a rough morning with numerous bowel movements.  Initially came in with cellulitis but then found to have a GI bleed.  Also had necrotic fourth toe.  Physical Exam: Vitals:   02/24/23 1240 02/24/23 1250 02/24/23 1300 02/24/23 1447  BP: (!) 167/71 (!) 189/78 (!) 178/84 (!) 154/72  Pulse: (!) 105 (!) 51 (!) 113 (!) 116  Resp: Temp: 98.1 F (36.7 C)   98.1 F (36.7 C)  TempSrc: Temporal   Oral  SpO2: 100% 100% 100% 100%  Weight:      Height:       Physical Exam HENT:     Head: Normocephalic.     Mouth/Throat:     Pharynx: No oropharyngeal exudate.  Eyes:     General: Lids are normal.     Conjunctiva/sclera: Conjunctivae  normal.  Cardiovascular:     Rate and Rhythm: Normal rate and regular rhythm.     Heart sounds: Normal heart sounds, S1 normal and S2 normal.  Pulmonary:     Breath sounds: Examination of the right-lower field reveals decreased breath sounds. Examination of the left-lower field reveals decreased breath sounds. Decreased breath sounds present. No wheezing, rhonchi or rales.  Abdominal:     Palpations: Abdomen is soft.     Tenderness: There is no abdominal tenderness.  Musculoskeletal:     Right lower leg: No swelling.      Left lower leg: No swelling.  Skin:    Comments: Left fourth toe either necrotic or fungus.  Between toes and on toes black material seen.  Numerous toenail fungus.  Neurological:     Mental Status: She is alert and oriented to person, place, and time.     Data Reviewed: Hemoglobin 9.5  Family Communication: Left message for Sister  Disposition: Status is: Inpatient Remains inpatient appropriate because: Patient had endoscopy today will have angiogram tomorrow  Planned Discharge Destination: Rehab    Time spent: 28 minutes  Author: Alford Highland, MD 02/24/2023 3:29 PM  For on call review [email or web address].

## 2023-02-24 NOTE — Progress Notes (Signed)
Physical Therapy Treatment Patient Details Name: [name] [name] MRN: [id number] DOB: October 22, 1935 Today's Date: 02/24/2023   History of Present Illness Pt  is a 88 y.o. female 88 year old female with history of lower extremity wounds, PAD, CAD, atrial fibrillation, congestive heart failure presents with lower extremity wounds getting worse. Patient was given doxycycline a few days ago for cellulitis but has gotten worse. In the ER she was placed on Rocephin.    PT Comments    OOB and completes x 1 lap on unit.  Pt continues to need min a x 1 for general safety.  Gait is somewhat improved today but continues to need +1 for safety.  She does voice concerns over ability to afford SNF and TOC messaged to speak with pt to answer questions.   Recommendations for follow up therapy are one component of a multi-disciplinary discharge planning process, led by the attending physician.  Recommendations may be updated based on patient status, additional functional criteria and insurance authorization.  Follow Up Recommendations       Assistance Recommended at Discharge    Patient can return home with the following A little help with walking and/or transfers;A little help with bathing/dressing/bathroom;Assistance with cooking/housework;Assist for transportation;Help with stairs or ramp for entrance   Equipment Recommendations       Recommendations for Other Services       Precautions / Restrictions Precautions Precautions: None Restrictions Weight Bearing Restrictions: No     Mobility  Bed Mobility Overal bed mobility: Modified Independent               Patient Response: Cooperative  Transfers Overall transfer level: Needs assistance Equipment used: Rolling walker (2 wheels) Transfers: Sit to/from Stand Sit to Stand: Min guard                Ambulation/Gait Ambulation/Gait assistance: Min Chemical engineer (Feet): 160 Feet Assistive device: Rolling walker (2  wheels) Gait Pattern/deviations: Step-through pattern, Trunk flexed Gait velocity: decreased     General Gait Details: poor RW positioning and ovreall gait quality but is somewhat improved from yesterday   Stairs             Wheelchair Mobility    Modified Rankin (Stroke Patients Only)       Balance Overall balance assessment: Needs assistance Sitting-balance support: Bilateral upper extremity supported, Feet supported Sitting balance-Leahy Scale: Fair     Standing balance support: Bilateral upper extremity supported, During functional activity, Reliant on assistive device for balance Standing balance-Leahy Scale: Poor                              Cognition Arousal/Alertness: Awake/alert Behavior During Therapy: WFL for tasks assessed/performed Overall Cognitive Status: Within Functional Limits for tasks assessed                                          Exercises      General Comments        Pertinent Vitals/Pain Pain Assessment Pain Assessment: No/denies pain    Home Living                          Prior Function            PT Goals (current goals can now be found in the care plan section) Progress  towards PT goals: Progressing toward goals    Frequency    Min 5X/week      PT Plan Current plan remains appropriate    Co-evaluation              AM-PAC PT "6 Clicks" Mobility   Outcome Measure  Help needed turning from your back to your side while in a flat bed without using bedrails?: None Help needed moving from lying on your back to sitting on the side of a flat bed without using bedrails?: A Little Help needed moving to and from a bed to a chair (including a wheelchair)?: A Little Help needed standing up from a chair using your arms (e.g., wheelchair or bedside chair)?: A Little Help needed to walk in hospital room?: A Little Help needed climbing 3-5 steps with a railing? : A Lot 6 Click  Score: 18    End of Session Equipment Utilized During Treatment: Gait belt Activity Tolerance: Patient tolerated treatment well Patient left: in bed;with bed alarm set;with call bell/phone within reach Nurse Communication: Mobility status PT Visit Diagnosis: Unsteadiness on feet (R26.81);Other abnormalities of gait and mobility (R26.89);Muscle weakness (generalized) (M62.81);Difficulty in walking, not elsewhere classified (R26.2);Adult, failure to thrive (R62.7)     Time: 0940-7680 PT Time Calculation (min) (ACUTE ONLY): 16 min  Charges:  $Gait Training: 8-22 mins                   Danielle Dess, PTA 02/24/23, 3:35 PM

## 2023-02-24 NOTE — Anesthesia Preprocedure Evaluation (Signed)
Anesthesia Evaluation  Patient identified by MRN, date of birth, ID band Patient confused    Reviewed: Allergy & Precautions, NPO status , Patient's Chart, lab work & pertinent test results  History of Anesthesia Complications Negative for: history of anesthetic complications  Airway Mallampati: III  TM Distance: >3 FB Neck ROM: full    Dental  (+) Chipped   Pulmonary neg pulmonary ROS, former smoker   Pulmonary exam normal        Cardiovascular hypertension, + CAD, + Past MI, + Cardiac Stents, + Peripheral Vascular Disease and +CHF  + dysrhythmias (on plavix) Atrial Fibrillation   ECHO (8/21) IMPRESSIONS     1. Left ventricular ejection fraction, by estimation, is 30 to 35%. The  left ventricle has moderately decreased function. The left ventricle  demonstrates regional wall motion abnormalities (see scoring  diagram/findings for description). There is mild  left ventricular hypertrophy. Left ventricular diastolic parameters are  consistent with Grade II diastolic dysfunction (pseudonormalization).  There is severe hypokinesis of the left ventricular, mid-apical  anteroseptal wall, anterior wall and inferior  wall. Wall motion is suggestive of an LAD infarct vs. stress induced  cardiomyopathy.   2. Right ventricular systolic function is normal. The right ventricular  size is normal. There is mildly elevated pulmonary artery systolic  pressure.   3. The mitral valve is normal in structure. Moderate mitral valve  regurgitation. No evidence of mitral stenosis.   4. The aortic valve is normal in structure. Aortic valve regurgitation is  not visualized. Mild to moderate aortic valve sclerosis/calcification is  present, without any evidence of aortic stenosis.     Neuro/Psych  Neuromuscular disease  negative psych ROS   GI/Hepatic negative GI ROS, Neg liver ROS,,,  Endo/Other  negative endocrine ROS    Renal/GU Renal  disease (aki, resolving)  negative genitourinary   Musculoskeletal   Abdominal   Peds  Hematology  (+) Blood dyscrasia, anemia   Anesthesia Other Findings Past Medical History: No date: Atrial fibrillation 2021: Basal cell carcinoma     Comment:  L temple No date: Chronic back pain No date: Congestive heart failure (CHF) 01/12/2020: History of total hip replacement (Bilateral)     Comment:  Left hip replacement done in 2019.  Right hip               replacement done in 2012. No date: Hypertension 06/11/2020: Hyponatremia 06/11/2020: NSTEMI (non-ST elevated myocardial infarction) 01/12/2020: Pharmacologic therapy 01/12/2020: Problems influencing health status No date: Scoliosis  Past Surgical History: No date: ABDOMINAL SURGERY No date: bilateral hip replacement 06/12/2020: CORONARY STENT INTERVENTION; N/A     Comment:  Procedure: CORONARY STENT INTERVENTION;  Surgeon: Iran Ouch, MD;  Location: [facility] INVASIVE CV LAB;                Service: Cardiovascular;  Laterality: N/A;  LAD 06/12/2020: LEFT HEART CATH AND CORONARY ANGIOGRAPHY; N/A     Comment:  Procedure: LEFT HEART CATH AND CORONARY ANGIOGRAPHY;                Surgeon: Iran Ouch, MD;  Location: [facility] INVASIVE               CV LAB;  Service: Cardiovascular;  Laterality: N/A; No date: TONSILLECTOMY  BMI    Body Mass Index: 17.22 kg/m      Reproductive/Obstetrics negative OB ROS  Anesthesia Physical Anesthesia Plan  ASA: 3  Anesthesia Plan: General   Post-op Pain Management: Minimal or no pain anticipated   Induction: Intravenous  PONV Risk Score and Plan: Propofol infusion and TIVA  Airway Management Planned: Natural Airway and Nasal Cannula  Additional Equipment:   Intra-op Plan:   Post-operative Plan:   Informed Consent: I have reviewed the patients History and Physical, chart, labs and discussed the procedure including  the risks, benefits and alternatives for the proposed anesthesia with the patient or authorized representative who has indicated his/her understanding and acceptance.     Dental Advisory Given  Plan Discussed with: Anesthesiologist, CRNA and Surgeon  Anesthesia Plan Comments: (Patient consented for risks of anesthesia including but not limited to:  - adverse reactions to medications - risk of airway placement if required - damage to eyes, teeth, lips or other oral mucosa - nerve damage due to positioning  - sore throat or hoarseness - Damage to heart, brain, nerves, lungs, other parts of body or loss of life  Patient voiced understanding.)        Anesthesia Quick Evaluation

## 2023-02-24 NOTE — Care Management Important Message (Signed)
Important Message  Patient Details  Name: [name] [name] MRN: [id number] Date of Birth: 08-May-1935   Medicare Important Message Given:  Yes  Patient asleep upon time of visit, no family in room.  Copy of Medicare IM left in room for reference.   [name] 02/24/2023, 12:04 PM

## 2023-02-24 NOTE — Op Note (Signed)
[facility] Gastroenterology Patient Name: [name] [name] Procedure Date: 02/24/2023 12:06 PM MRN: [id number] Account #: [id number] Date of Birth: Jan 16, 1935 Admit Type: Inpatient Age: 88 Room: [facility] ENDO ROOM 4 Gender: Female Note Status: Finalized Instrument Name: Patton Salles Endoscope [id number] Procedure:             Upper GI endoscopy Indications:           Melena Providers:             Midge Minium MD, MD Medicines:             Propofol per Anesthesia Complications:         No immediate complications. Procedure:             Pre-Anesthesia Assessment:                        - Prior to the procedure, a History and Physical was                         performed, and patient medications and allergies were                         reviewed. The patient's tolerance of previous                         anesthesia was also reviewed. The risks and benefits                         of the procedure and the sedation options and risks                         were discussed with the patient. All questions were                         answered, and informed consent was obtained. Prior                         Anticoagulants: The patient has taken Plavix                         (clopidogrel), last dose was 4 days prior to                         procedure. ASA Grade Assessment: II - A patient with                         mild systemic disease. After reviewing the risks and                         benefits, the patient was deemed in satisfactory                         condition to undergo the procedure.                        After obtaining informed consent, the endoscope was                         passed under direct vision. Throughout the  procedure,                         the patient's blood pressure, pulse, and oxygen                         saturations were monitored continuously. The Endoscope                         was introduced through the mouth, and advanced to the                          third part of duodenum. The upper GI endoscopy was                         accomplished without difficulty. The patient tolerated                         the procedure well. Findings:      A medium-sized hiatal hernia was present.      Multiple angiodysplastic lesions with no bleeding were found in the       entire examined stomach. Coagulation for destruction of remaining       portion of lesion using argon plasma at 2 liters/minute and 20 watts was       successful.      The examined duodenum was normal. Impression:            - Medium-sized hiatal hernia.                        - Multiple non-bleeding angiodysplastic lesions in the                         stomach. Treated with argon plasma coagulation (APC).                        - Normal examined duodenum.                        - No specimens collected. Recommendation:        - Return patient to hospital ward for ongoing care.                        - Resume previous diet.                        - Continue present medications. Procedure Code(s):     --- Professional ---                        [phone], Esophagogastroduodenoscopy, flexible,                         transoral; with ablation of tumor(s), polyp(s), or                         other lesion(s) (includes pre- and post-dilation and                         guide wire passage, when performed) Diagnosis Code(s):     --- Professional ---  K92.1, Melena (includes Hematochezia)                        K31.819, Angiodysplasia of stomach and duodenum                         without bleeding CPT copyright 2022 American [employer] Association. All rights reserved. The codes documented in this report are preliminary and upon coder review may  be revised to meet current compliance requirements. Midge Minium MD, MD 02/24/2023 12:35:34 PM This report has been signed electronically. Number of Addenda: 0 Note Initiated On: 02/24/2023 12:06 PM Estimated Blood  Loss:  Estimated blood loss: none.      [facility]

## 2023-02-24 NOTE — Transfer of Care (Signed)
Immediate Anesthesia Transfer of Care Note  Patient: [name] [name]  Procedure(s) Performed: ESOPHAGOGASTRODUODENOSCOPY (EGD) WITH PROPOFOL  Patient Location: Endoscopy Unit  Anesthesia Type:General  Level of Consciousness: awake, alert , and oriented  Airway & Oxygen Therapy: Patient Spontanous Breathing  Post-op Assessment: Report given to RN and Post -op Vital signs reviewed and stable  Post vital signs: Reviewed and stable  Last Vitals:  Vitals Value Taken Time  BP    Temp    Pulse 106 02/24/23 1241  Resp 22 02/24/23 1241  SpO2 100 % 02/24/23 1241  Vitals shown include unvalidated device data.  Last Pain:  Vitals:   02/24/23 1050  TempSrc: Oral  PainSc: 0-No pain      Patients Stated Pain Goal: 0 (02/23/23 1730)  Complications: No notable events documented.

## 2023-02-25 ENCOUNTER — Encounter
Admission: EM | Disposition: A | Discharge status: Skilled Nursing Facility | Payer: Self-pay | Source: Home / Self Care | Attending: Internal Medicine

## 2023-02-25 ENCOUNTER — Encounter: Payer: Self-pay | Admitting: Gastroenterology

## 2023-02-25 DIAGNOSIS — I70239 Atherosclerosis of native arteries of right leg with ulceration of unspecified site: Secondary | ICD-10-CM

## 2023-02-25 DIAGNOSIS — D62 Acute posthemorrhagic anemia: Secondary | ICD-10-CM | POA: Diagnosis not present

## 2023-02-25 DIAGNOSIS — I700 Atherosclerosis of aorta: Secondary | ICD-10-CM

## 2023-02-25 DIAGNOSIS — L97919 Non-pressure chronic ulcer of unspecified part of right lower leg with unspecified severity: Secondary | ICD-10-CM

## 2023-02-25 DIAGNOSIS — L97929 Non-pressure chronic ulcer of unspecified part of left lower leg with unspecified severity: Secondary | ICD-10-CM | POA: Diagnosis not present

## 2023-02-25 DIAGNOSIS — I739 Peripheral vascular disease, unspecified: Secondary | ICD-10-CM | POA: Diagnosis not present

## 2023-02-25 DIAGNOSIS — L03119 Cellulitis of unspecified part of limb: Secondary | ICD-10-CM | POA: Diagnosis not present

## 2023-02-25 DIAGNOSIS — K922 Gastrointestinal hemorrhage, unspecified: Secondary | ICD-10-CM | POA: Diagnosis not present

## 2023-02-25 DIAGNOSIS — R636 Underweight: Secondary | ICD-10-CM

## 2023-02-25 DIAGNOSIS — I70249 Atherosclerosis of native arteries of left leg with ulceration of unspecified site: Secondary | ICD-10-CM | POA: Diagnosis not present

## 2023-02-25 HISTORY — PX: LOWER EXTREMITY ANGIOGRAPHY: CATH118251

## 2023-02-25 LAB — BASIC METABOLIC PANEL
Anion gap: 9 (ref 5–15)
BUN: 16 mg/dL (ref 8–23)
CO2: 26 mmol/L (ref 22–32)
Calcium: 7.8 mg/dL — ABNORMAL LOW (ref 8.9–10.3)
Chloride: 99 mmol/L (ref 98–111)
Creatinine, Ser: 0.53 mg/dL (ref 0.44–1.00)
GFR, Estimated: >60 mL/min (ref >60)
Glucose, Bld: 99 mg/dL (ref 70–99)
Potassium: 3.4 mmol/L — ABNORMAL LOW (ref 3.5–5.1)
Sodium: 134 mmol/L — ABNORMAL LOW (ref 135–145)

## 2023-02-25 LAB — CBC
HCT: 31.4 % — ABNORMAL LOW (ref 36.0–46.0)
Hemoglobin: 9.5 g/dL — ABNORMAL LOW (ref 12.0–15.0)
MCH: 26.5 pg (ref 26.0–34.0)
MCHC: 30.3 g/dL (ref 30.0–36.0)
MCV: 87.5 fL (ref 80.0–100.0)
Platelets: 340 10*3/uL (ref 150–400)
RBC: 3.59 MIL/uL — ABNORMAL LOW (ref 3.87–5.11)
RDW: 14.5 % (ref 11.5–15.5)
WBC: 8.1 10*3/uL (ref 4.0–10.5)
nRBC: 0 % (ref 0.0–0.2)

## 2023-02-25 LAB — MRSA NEXT GEN BY PCR, NASAL: MRSA by PCR Next Gen: NOT DETECTED

## 2023-02-25 SURGERY — LOWER EXTREMITY ANGIOGRAPHY
Anesthesia: Moderate Sedation | Laterality: Right

## 2023-02-25 MED ORDER — FENTANYL CITRATE (PF) 100 MCG/2ML IJ SOLN: 12.5000 ug | Freq: Once | INTRAMUSCULAR | Status: DC | PRN

## 2023-02-25 MED ORDER — HYDROMORPHONE HCL 1 MG/ML IJ SOLN
1.0000 mg | Freq: Once | INTRAMUSCULAR | Status: AC | PRN
  Administered 2023-02-25: 1 mg via INTRAVENOUS
  Filled 2023-02-25: qty 1

## 2023-02-25 MED ORDER — FENTANYL CITRATE (PF) 100 MCG/2ML IJ SOLN
INTRAMUSCULAR | Status: DC | PRN
  Administered 2023-02-25: 25 ug via INTRAVENOUS
  Administered 2023-02-25: 12.5 ug via INTRAVENOUS
  Administered 2023-02-25 (×2): 25 ug via INTRAVENOUS
  Administered 2023-02-25: 12.5 ug via INTRAVENOUS

## 2023-02-25 MED ORDER — IODIXANOL 320 MG/ML IV SOLN
INTRAVENOUS | Status: DC | PRN
  Administered 2023-02-25: 70 mL

## 2023-02-25 MED ORDER — ACETAMINOPHEN 325 MG PO TABS
650.0000 mg | ORAL_TABLET | ORAL | Status: DC | PRN
  Administered 2023-02-26 – 2023-03-01 (×3): 650 mg via ORAL
  Filled 2023-02-25 (×3): qty 2

## 2023-02-25 MED ORDER — CEFAZOLIN SODIUM-DEXTROSE 2-4 GM/100ML-% IV SOLN
2.0000 g | INTRAVENOUS | Status: AC
  Administered 2023-02-25: 2 g via INTRAVENOUS

## 2023-02-25 MED ORDER — ONDANSETRON HCL 4 MG/2ML IJ SOLN: 4.0000 mg | Freq: Four times a day (QID) | INTRAMUSCULAR | Status: DC | PRN

## 2023-02-25 MED ORDER — SODIUM CHLORIDE 0.9% FLUSH: 3.0000 mL | INTRAVENOUS | Status: DC | PRN

## 2023-02-25 MED ORDER — CEFAZOLIN SODIUM-DEXTROSE 2-4 GM/100ML-% IV SOLN
INTRAVENOUS | Status: AC
  Filled 2023-02-25: qty 100

## 2023-02-25 MED ORDER — DIPHENHYDRAMINE HCL 50 MG/ML IJ SOLN: 50.0000 mg | Freq: Once | INTRAMUSCULAR | Status: DC | PRN

## 2023-02-25 MED ORDER — FENTANYL CITRATE (PF) 100 MCG/2ML IJ SOLN
INTRAMUSCULAR | Status: AC
  Filled 2023-02-25: qty 2

## 2023-02-25 MED ORDER — METHYLPREDNISOLONE SODIUM SUCC 125 MG IJ SOLR: 125.0000 mg | Freq: Once | INTRAMUSCULAR | Status: DC | PRN

## 2023-02-25 MED ORDER — FAMOTIDINE 20 MG PO TABS: 40.0000 mg | ORAL_TABLET | Freq: Once | ORAL | Status: DC | PRN

## 2023-02-25 MED ORDER — SODIUM CHLORIDE 0.9 % IV SOLN: INTRAVENOUS | Status: AC

## 2023-02-25 MED ORDER — SODIUM CHLORIDE 0.9 % IV SOLN: INTRAVENOUS | Status: DC

## 2023-02-25 MED ORDER — SODIUM CHLORIDE 0.9% FLUSH
3.0000 mL | Freq: Two times a day (BID) | INTRAVENOUS | Status: DC
  Administered 2023-02-26 – 2023-03-03 (×10): 3 mL via INTRAVENOUS

## 2023-02-25 MED ORDER — HEPARIN SODIUM (PORCINE) 1000 UNIT/ML IJ SOLN
INTRAMUSCULAR | Status: DC | PRN
  Administered 2023-02-25: 4000 [IU] via INTRAVENOUS

## 2023-02-25 MED ORDER — CEPHALEXIN 500 MG PO CAPS: 500.0000 mg | ORAL_CAPSULE | Freq: Three times a day (TID) | ORAL | Status: DC

## 2023-02-25 MED ORDER — MIDAZOLAM HCL 5 MG/5ML IJ SOLN
INTRAMUSCULAR | Status: AC
  Filled 2023-02-25: qty 5

## 2023-02-25 MED ORDER — SODIUM CHLORIDE 0.9 % IV SOLN: 250.0000 mL | INTRAVENOUS | Status: DC | PRN

## 2023-02-25 MED ORDER — MIDAZOLAM HCL 2 MG/2ML IJ SOLN
INTRAMUSCULAR | Status: DC | PRN
  Administered 2023-02-25 (×2): .5 mg via INTRAVENOUS
  Administered 2023-02-25: 1 mg via INTRAVENOUS

## 2023-02-25 MED ORDER — HEPARIN SODIUM (PORCINE) 1000 UNIT/ML IJ SOLN
INTRAMUSCULAR | Status: AC
  Filled 2023-02-25: qty 10

## 2023-02-25 MED ORDER — OXYCODONE HCL 5 MG PO TABS
5.0000 mg | ORAL_TABLET | ORAL | Status: DC | PRN
  Administered 2023-02-26 – 2023-02-27 (×3): 5 mg via ORAL
  Administered 2023-02-28 – 2023-03-01 (×3): 10 mg via ORAL
  Administered 2023-03-02: 5 mg via ORAL
  Administered 2023-03-02: 10 mg via ORAL
  Filled 2023-02-25: qty 1
  Filled 2023-02-25 (×3): qty 2
  Filled 2023-02-25: qty 1
  Filled 2023-02-25: qty 2
  Filled 2023-02-25 (×2): qty 1

## 2023-02-25 MED ORDER — MIDAZOLAM HCL 2 MG/ML PO SYRP: 8.0000 mg | ORAL_SOLUTION | Freq: Once | ORAL | Status: DC | PRN

## 2023-02-25 MED ORDER — CLOPIDOGREL BISULFATE 75 MG PO TABS
300.0000 mg | ORAL_TABLET | Freq: Once | ORAL | Status: AC
  Administered 2023-02-25: 300 mg via ORAL
  Filled 2023-02-25: qty 4

## 2023-02-25 MED ORDER — MORPHINE SULFATE (PF) 2 MG/ML IV SOLN
2.0000 mg | INTRAVENOUS | Status: DC | PRN
  Administered 2023-02-27 – 2023-03-01 (×4): 2 mg via INTRAVENOUS
  Filled 2023-02-25 (×5): qty 1

## 2023-02-25 MED ORDER — POTASSIUM CHLORIDE 10 MEQ/100ML IV SOLN
10.0000 meq | INTRAVENOUS | Status: AC
  Administered 2023-02-25 (×2): 10 meq via INTRAVENOUS
  Filled 2023-02-25 (×2): qty 100

## 2023-02-25 MED ORDER — CLOPIDOGREL BISULFATE 75 MG PO TABS
75.0000 mg | ORAL_TABLET | Freq: Every day | ORAL | Status: DC
  Administered 2023-02-26 – 2023-03-03 (×6): 75 mg via ORAL
  Filled 2023-02-25 (×7): qty 1

## 2023-02-25 MED ORDER — CEPHALEXIN 500 MG PO CAPS
500.0000 mg | ORAL_CAPSULE | Freq: Four times a day (QID) | ORAL | Status: DC
  Administered 2023-02-25 – 2023-03-03 (×22): 500 mg via ORAL
  Filled 2023-02-25 (×22): qty 1

## 2023-02-25 SURGICAL SUPPLY — 28 items
BALLN LUTONIX 4X150X130 (BALLOONS) ×1
BALLN LUTONIX 5X220X130 (BALLOONS) ×1
BALLN LUTONIX DCB 7X40X130 (BALLOONS) ×1
BALLOON LUTONIX 4X150X130 (BALLOONS) IMPLANT
BALLOON LUTONIX 5X220X130 (BALLOONS) IMPLANT
BALLOON LUTONIX DCB 7X40X130 (BALLOONS) IMPLANT
CATH ANGIO 5F PIGTAIL 65CM (CATHETERS) IMPLANT
CATH TEMPO 5F RIM 65CM (CATHETERS) IMPLANT
CATH VERT 5X100 (CATHETERS) IMPLANT
COVER DRAPE FLUORO 36X44 (DRAPES) IMPLANT
COVER PROBE ULTRASOUND 5X96 (MISCELLANEOUS) IMPLANT
DEVICE STARCLOSE SE CLOSURE (Vascular Products) IMPLANT
GLIDEWIRE ADV .035X260CM (WIRE) IMPLANT
GOWN STRL REUS W/ TWL LRG LVL3 (GOWN DISPOSABLE) ×1 IMPLANT
GOWN STRL REUS W/TWL LRG LVL3 (GOWN DISPOSABLE) ×1
KIT ENCORE 26 ADVANTAGE (KITS) IMPLANT
LIFESTENT SOLO 6X200X135 (Permanent Stent) IMPLANT
NDL ENTRY 21GA 7CM ECHOTIP (NEEDLE) IMPLANT
NEEDLE ENTRY 21GA 7CM ECHOTIP (NEEDLE) ×1 IMPLANT
PACK ANGIOGRAPHY (CUSTOM PROCEDURE TRAY) ×1 IMPLANT
SET INTRO CAPELLA COAXIAL (SET/KITS/TRAYS/PACK) IMPLANT
SHEATH ANL2 6FRX45 HC (SHEATH) IMPLANT
SHEATH BRITE TIP 5FRX11 (SHEATH) IMPLANT
STENT LIFESTAR 9X40 (Permanent Stent) IMPLANT
SYR MEDRAD MARK 7 150ML (SYRINGE) IMPLANT
TUBING CONTRAST HIGH PRESS 72 (TUBING) IMPLANT
WIRE GUIDERIGHT .035X150 (WIRE) IMPLANT
WIRE SUPRACORE 300CM (WIRE) IMPLANT

## 2023-02-25 NOTE — Assessment & Plan Note (Signed)
>>  ASSESSMENT AND PLAN FOR UNDERWEIGHT WRITTEN ON 02/25/2023  5:07 PM BY Verla Glaze, MD  BMI 17.22

## 2023-02-25 NOTE — Progress Notes (Signed)
This patient had an EGD yesterday with multiple AVMs seen in the stomach without any AVM seen in the entire duodenum that was evaluated.  The patient's hemoglobin has been stable.  Nothing further to do from a GI point of view at this time.  Please contact us again if the patient should have a drop in her hemoglobin or have any further melanotic stools.  I will sign off.  Please call if any further GI concerns or questions.  We would like to thank you for the opportunity to participate in the care of [name] [name].

## 2023-02-25 NOTE — Progress Notes (Signed)
Progress Note   Patient: [name] [name] ZOX:[id number] DOB: 1935-06-13 DOA: 02/20/2023     5 DOS: the patient was seen and examined on 02/25/2023   Brief hospital course: 88 year old female with history of lower extremity wounds, PAD, CAD, atrial fibrillation, congestive heart failure presents with lower extremity wounds getting worse.  Patient was given doxycycline a few days ago for cellulitis but has gotten worse.  In the ER she was placed on Rocephin.  4/12.  Called by nursing staff with black tar-like stool.  Hemoglobin 7.3.  I discontinued Plavix and heparin subcutaneous injections.  GI consultation.  Patient made n.p.o. and placed on Protonix IV twice daily.  Benefits and risks of blood transfusion explained to patient and family.  1 unit of packed red blood cells ordered. 4/13.  Responded well to 1 unit of packed red blood cells.  Hemoglobin up to 9.7.  Still having brown-black bowel movements.  Holding Plavix.  Angiogram lower extremity scheduled for Tuesday.  Endoscopy potentially on Monday. 4/14.  Hemoglobin 9.3 4/15.  Hemoglobin 9.5.  EGD showing multiple angiodysplastic lesions that were cauterized.  No active bleeding seen. 4/16.  angiogram with angioplasty and stent right superficial femoral artery, angioplasty and stent right external iliac artery.  Patient not interested in toe amputation at this time.  Assessment and Plan: * Upper GI bleeding Plavix and heparin subcutaneous injection discontinued on 4/12.    EGD showing multiple angiodysplastic lesions that were cauterized.  IV Protonix twice daily.  Transfused 1 unit of packed red blood cells for hemoglobin of 7.3 on 4/12.  And last hemoglobin 9.5.  Watch closely with bleeding since vascular surgery restarted Plavix.  Acute blood loss anemia Transfused 1 unit of packed red blood cells for hemoglobin of 7.3 on 4/12.  Hemoglobin up to 9.5.  Patient given IV Venofer.  Will give oral B12 supplementation.  Watch closely for bleeding  with vascular surgery restarting Plavix.  Cellulitis of multiple sites of lower extremity IV Rocephin.  Toe necrosis Left fourth toe.  Patient not interested in amputation at this point.  AKI (acute kidney injury) Creatinine 1.1 on presentation down to 0.67.  Atrial fibrillation Paroxysmal in nature.  Holding Plavix with GI bleed.  Continue Toprol    Coronary artery disease of native artery of native heart with stable angina pectoris Will continue patient on Toprol  Hypertension Continue Toprol.  Add back lisinopril.   Tobacco abuse Nicotine patch.   Underweight BMI 17.22  PVD (peripheral vascular disease) Angiogram right lower extremity with angioplasty and stent placement right superficial femoral artery and right external iliac artery.  Vascular surgery restarted Plavix.  Chronic systolic CHF (congestive heart failure) No current signs of heart failure.  Last echocardiogram back in 2021 showed an EF of 30%.  Hyponatremia Sodium 125 on presentation and up to 134        Subjective: Patient feeling okay.  Interested in getting out of the hospital soon as possible.  No discomfort in her legs.  Initially admitted with cellulitis of her leg and then had a GI bleed and found to have left necrotic toe.  Physical Exam: Vitals:   02/25/23 1330 02/25/23 1345 02/25/23 1400 02/25/23 1457  BP: (!) 183/73 (!) 143/76 (!) 177/70 (!) 142/78  Pulse: 85 90  (!) 50  Resp: Temp:    97.7 F (36.5 C)  TempSrc:    Axillary  SpO2: 97% 93%  99%  Weight:      Height:  Physical Exam HENT:     Head: Normocephalic.     Mouth/Throat:     Pharynx: No oropharyngeal exudate.  Eyes:     General: Lids are normal.     Conjunctiva/sclera: Conjunctivae normal.  Cardiovascular:     Rate and Rhythm: Normal rate and regular rhythm.     Heart sounds: Normal heart sounds, S1 normal and S2 normal.  Pulmonary:     Breath sounds: Examination of the right-lower field  reveals decreased breath sounds. Examination of the left-lower field reveals decreased breath sounds. Decreased breath sounds present. No wheezing, rhonchi or rales.  Abdominal:     Palpations: Abdomen is soft.     Tenderness: There is no abdominal tenderness.  Musculoskeletal:     Right lower leg: No swelling.     Left lower leg: No swelling.  Skin:    Comments: Left fourth toe either necrotic or fungus.  Between toes and on toes black material seen.  Numerous toenail fungus.  Neurological:     Mental Status: She is alert and oriented to person, place, and time.     Data Reviewed: Hemoglobin 9.5, sodium 134, potassium 3.4, creatinine 0.53  Family Communication: Left message for patient's sister  Disposition: Status is: Inpatient Remains inpatient appropriate because: Status post angiogram today.  Check creatinine tomorrow.  Planned Discharge Destination: Rehab    Time spent: 28 minutes  Author: Alford Highland, MD 02/25/2023 5:07 PM  For on call review [email or web address].

## 2023-02-25 NOTE — TOC Progression Note (Signed)
Transition of Care [facility]) - Progression Note    Patient Details  Name: [name] [name] MRN: [id number] Date of Birth: [birth year]  Transition of Care [facility]) CM/SW Contact  Truddie Hidden, RN Phone Number: 02/25/2023, 3:34 PM  Clinical Narrative:    Obtained patient's SS# Gave bed offer for [facility].  Per her sister she would like a SNF near her home. Bed search extended to [facility].     Expected Discharge Plan: Skilled Nursing Facility Barriers to Discharge: Continued Medical Work up  Expected Discharge Plan and Services     Post Acute Care Choice: Skilled Nursing Facility Living arrangements for the past 2 months: Apartment                                       Social Determinants of Health (SDOH) Interventions SDOH Screenings   Housing: Low Risk  (12/25/2022)  Transportation Needs: No Transportation Needs (12/25/2022)  Depression (PHQ2-9): High Risk (12/25/2022)  Physical Activity: Insufficiently Active (12/25/2022)  Social Connections: Socially Isolated (12/25/2022)  Stress: Stress Concern Present (12/25/2022)  Tobacco Use: Medium Risk (02/25/2023)    Readmission Risk Interventions     No data to display

## 2023-02-25 NOTE — Progress Notes (Signed)
Called by CCMD patient heart rate dropped to 48 but did not sustain

## 2023-02-25 NOTE — Progress Notes (Signed)
Physical Therapy Treatment Patient Details Name: [name] [name] MRN: [id number] DOB: 1935/07/27 Today's Date: 02/25/2023   History of Present Illness Pt  is a 88 y.o. female 88 year old female with history of lower extremity wounds, PAD, CAD, atrial fibrillation, congestive heart failure presents with lower extremity wounds getting worse. Patient was given doxycycline a few days ago for cellulitis but has gotten worse. In the ER she was placed on Rocephin.    PT Comments    Ambulated x 1 lap on unit with RW and contact guard/min assist.  Gait continues to improve daily.  She is inc small loose brown BM upon standing and does transition to [facility].  Continues with poor transitions to sitting often falling back onto surfaces.  Would benefit from working on controlled descent and hand placements in future sessions.   Recommendations for follow up therapy are one component of a multi-disciplinary discharge planning process, led by the attending physician.  Recommendations may be updated based on patient status, additional functional criteria and insurance authorization.  Follow Up Recommendations       Assistance Recommended at Discharge Intermittent Supervision/Assistance  Patient can return home with the following A little help with walking and/or transfers;A little help with bathing/dressing/bathroom;Assistance with cooking/housework;Assist for transportation;Help with stairs or ramp for entrance   Equipment Recommendations  None recommended by PT    Recommendations for Other Services       Precautions / Restrictions Precautions Precautions: None Restrictions Weight Bearing Restrictions: No     Mobility  Bed Mobility Overal bed mobility: Modified Independent               Patient Response: Cooperative  Transfers                        Ambulation/Gait Ambulation/Gait assistance: Min assist, Min guard Gait Distance (Feet): 160 Feet Assistive device: Rolling  walker (2 wheels) Gait Pattern/deviations: Step-through pattern, Trunk flexed Gait velocity: decreased     General Gait Details: gait improving daily.   uses rollator at home   Stairs             Wheelchair Mobility    Modified Rankin (Stroke Patients Only)       Balance Overall balance assessment: Needs assistance Sitting-balance support: Feet supported Sitting balance-Leahy Scale: Fair     Standing balance support: Bilateral upper extremity supported Standing balance-Leahy Scale: Poor                              Cognition Arousal/Alertness: Awake/alert Behavior During Therapy: WFL for tasks assessed/performed Overall Cognitive Status: Within Functional Limits for tasks assessed                                          Exercises Other Exercises Other Exercises: BSC for small loose BM    General Comments        Pertinent Vitals/Pain Pain Assessment Pain Assessment: No/denies pain    Home Living                          Prior Function            PT Goals (current goals can now be found in the care plan section) Progress towards PT goals: Progressing toward goals    Frequency  Min 5X/week      PT Plan Current plan remains appropriate    Co-evaluation              AM-PAC PT "6 Clicks" Mobility   Outcome Measure  Help needed turning from your back to your side while in a flat bed without using bedrails?: None Help needed moving from lying on your back to sitting on the side of a flat bed without using bedrails?: None Help needed moving to and from a bed to a chair (including a wheelchair)?: A Little Help needed standing up from a chair using your arms (e.g., wheelchair or bedside chair)?: A Little Help needed to walk in hospital room?: A Little Help needed climbing 3-5 steps with a railing? : A Lot 6 Click Score: 19    End of Session Equipment Utilized During Treatment: Gait belt    Patient left: in bed;with bed alarm set;with call bell/phone within reach Nurse Communication: Mobility status PT Visit Diagnosis: Unsteadiness on feet (R26.81);Other abnormalities of gait and mobility (R26.89);Muscle weakness (generalized) (M62.81);Difficulty in walking, not elsewhere classified (R26.2);Adult, failure to thrive (R62.7)     Time: 1610-9604 PT Time Calculation (min) (ACUTE ONLY): 17 min  Charges:  $Gait Training: 8-22 mins                   Danielle Dess, PTA 02/25/23, 9:16 AM

## 2023-02-25 NOTE — TOC Progression Note (Signed)
Transition of Care [facility]) - Progression Note    Patient Details  Name: [name] [name] MRN: [id number] Date of Birth: 1935/03/28  Transition of Care [facility]) CM/SW Contact  Truddie Hidden, RN Phone Number: 02/25/2023, 2:27 PM  Clinical Narrative:    Attempting to obtain social security number for [place] [employer]. Patient out of room for a procedure.  Contacted patient's sister. She is not in a location to look for patient's SS#. She will look once she is home.     Expected Discharge Plan: Skilled Nursing Facility Barriers to Discharge: Continued Medical Work up  Expected Discharge Plan and Services     Post Acute Care Choice: Skilled Nursing Facility Living arrangements for the past 2 months: Apartment                                       Social Determinants of Health (SDOH) Interventions SDOH Screenings   Housing: Low Risk  (12/25/2022)  Transportation Needs: No Transportation Needs (12/25/2022)  Depression (PHQ2-9): High Risk (12/25/2022)  Physical Activity: Insufficiently Active (12/25/2022)  Social Connections: Socially Isolated (12/25/2022)  Stress: Stress Concern Present (12/25/2022)  Tobacco Use: Medium Risk (02/25/2023)    Readmission Risk Interventions     No data to display

## 2023-02-25 NOTE — Assessment & Plan Note (Signed)
Angiogram right lower extremity with angioplasty and stent placement right superficial femoral artery and right external iliac artery.  Vascular surgery restarted Plavix.

## 2023-02-25 NOTE — Op Note (Signed)
[name] [name]  Percutaneous Study/Intervention Procedural Note   Date of Surgery: 02/25/2023  Surgeon:  [name] Dills, MD.  Pre-operative Diagnosis: Atherosclerotic occlusive disease bilateral lower extremities with bilateral ulcerations  Post-operative diagnosis:  Same  Procedure(s) Performed:             1.  Introduction catheter into right lower extremity 3rd order catheter placement              2.    Contrast injection right lower extremity for distal runoff             3.  Percutaneous transluminal angioplasty and stent placement right superficial femoral artery             4.  Percutaneous transluminal angioplasty and stent placement right external iliac artery.               5.  Star close closure left common femoral arteriotomy  Anesthesia: Conscious sedation was administered under my direct supervision by the interventional radiology RN. IV Versed plus fentanyl were utilized. Continuous ECG, pulse oximetry and blood pressure was monitored throughout the entire procedure.  Conscious sedation was for a total of 1 hour and 2 minutes 1 second.  Sheath: 6 [place] Ansell left common femoral retrograde  Contrast: 70 cc  Fluoroscopy Time: 8.2 minutes  Indications:  [name] [name] presents with ulcerations of both lower extremities.  She has nonpalpable pulses and noninvasive studies indicating significant atherosclerotic occlusive disease.  This places her at high risk for limb loss.  Angiography with hope for intervention is recommended for limb salvage.  The risks and benefits are reviewed all questions answered patient agrees to proceed.  Procedure:  [name] [name] is a 88 y.o. y.o. female who was identified and appropriate procedural time out was performed.  The patient was then placed supine on the table and prepped and draped in the usual sterile fashion.    Ultrasound was placed in the sterile sleeve and the left groin was evaluated the left common  femoral artery was echolucent and pulsatile indicating patency.  Image was recorded for the permanent record and under real-time visualization a microneedle was inserted into the common femoral artery microwire followed by a micro-sheath.  A J-wire was then advanced through the micro-sheath and a  5 [place] sheath was then inserted over a J-wire. J-wire was then advanced and a 5 French pigtail catheter was positioned at the level of T12. AP projection of the aorta was then obtained. Pigtail catheter was repositioned to above the bifurcation and a LAO view of the pelvis was obtained.  Subsequently a rim catheter with the stiff angle Glidewire was used to cross the aortic bifurcation the catheter wire were advanced down into the right distal external iliac artery. Oblique view of the femoral bifurcation was then obtained and subsequently the wire was reintroduced and the pigtail catheter negotiated into the SFA representing third order catheter placement. Distal runoff was then performed.  6000 units of heparin was then given and allowed to circulate and a 6 [place] Ansell sheath was advanced up and over the bifurcation and positioned in the femoral artery  KMP  catheter and advantage Glidewire were then negotiated down into the mid popliteal.  Hand-injection of contrast was used to verify length intraluminal positioning. The supra core wire was then reintroduced and the sheath pulled back into the right common iliac.  Magnified imaging in the LAO projection was then performed.  A 9 mm x 40  mm life star stent was then deployed across the lesion within the external iliac.  It was postdilated with a 7 mm x 40 mm Lutonix drug-eluting balloon inflated to 8 atm for 1 minute.  Follow-up imaging demonstrated less than 15% residual stenosis and attention was turned to the SFA.  A follow-up image was obtained through the sheath of the SFA.  A 4 mm x 150 mm Lutonix balloon was used to angioplasty the superficial femoral and  above-knee popliteal arteries. Inflation was to 10 atmospheres for 1 minute. Follow-up imaging demonstrated patency with greater than 50% residual stenosis.  Based on the images I selected a 6 mm x 200 mm life stent this was deployed with its distal end and the above-knee popliteal extending into the proximal SFA.  It was then postdilated with a 5 mm x 220 mm Lutonix drug-eluting balloon inflated to 8 atm for approximately 1 minute.  Follow-up imaging demonstrated less than 10% residual stenosis throughout the SFA and popliteal arteries.  There was preservation of distal runoff.  After review of these images the sheath is pulled into the left external iliac oblique of the common femoral is obtained and a Star close device deployed. There no immediate complications.   Findings:  The abdominal aorta is opacified with a bolus injection contrast. Renal arteries are single and patent. The aorta itself has diffuse disease but no hemodynamically significant lesions. The right common iliac is patent with a 40 to 50% ostial stenosis noted but otherwise there are no hemodynamically significant stenoses, on the left there is a greater than 75% stenosis in the proximal and mid common iliac artery.  The origin and proximal 2 cm of the right external iliac artery demonstrates a greater than 75% stenosis.  Distal to this lesion the external iliac is widely patent.  On the left the external iliac artery appears diffusely diseased but there are no hemodynamically significant stenoses.  The right common femoral is widely patent as is the profunda femoris.  The SFA does indeed have a significant stenosis beginning in its midportion there multiple lesions of greater than 80%.  At [facility] canal there is an occlusion of the that extends over 3 to 4 cm.  The above-knee popliteal demonstrates diffuse greater than 60% stenosis.  The mid and distal popliteal appear to be widely patent.  Trifurcation is patent with three-vessel runoff  to the foot.    Following angioplasty and stent placement the right SFA and above-knee popliteal is now patent with in-line flow and looks quite nice with less than 10% residual stenosis.  Following angioplasty and stent placement the right external iliac artery is now widely patent with less than 10% residual stenosis    Summary: Successful recanalization right lower extremity for limb salvage                        Disposition: Patient was taken to the recovery room in stable condition having tolerated the procedure well.  [name] [name], [name] [name] 02/25/2023,1:39 PM

## 2023-02-25 NOTE — Interval H&P Note (Signed)
History and Physical Interval Note:  02/25/2023 11:44 AM  [name] [name]  has presented today for surgery, with the diagnosis of PAD.  The various methods of treatment have been discussed with the patient and family. After consideration of risks, benefits and other options for treatment, the patient has consented to  Procedure(s): Lower Extremity Angiography (Right) as a surgical intervention.  The patient's history has been reviewed, patient examined, no change in status, stable for surgery.  I have reviewed the patient's chart and labs.  Questions were answered to the patient's satisfaction.     [name]

## 2023-02-25 NOTE — Assessment & Plan Note (Signed)
BMI 17.22

## 2023-02-26 ENCOUNTER — Encounter: Payer: Self-pay | Admitting: Vascular Surgery

## 2023-02-26 DIAGNOSIS — I739 Peripheral vascular disease, unspecified: Secondary | ICD-10-CM | POA: Diagnosis not present

## 2023-02-26 DIAGNOSIS — K922 Gastrointestinal hemorrhage, unspecified: Secondary | ICD-10-CM | POA: Diagnosis not present

## 2023-02-26 DIAGNOSIS — D62 Acute posthemorrhagic anemia: Secondary | ICD-10-CM | POA: Diagnosis not present

## 2023-02-26 LAB — CBC
HCT: 29.7 % — ABNORMAL LOW (ref 36.0–46.0)
Hemoglobin: 9.3 g/dL — ABNORMAL LOW (ref 12.0–15.0)
MCH: 26.8 pg (ref 26.0–34.0)
MCHC: 31.3 g/dL (ref 30.0–36.0)
MCV: 85.6 fL (ref 80.0–100.0)
Platelets: 312 10*3/uL (ref 150–400)
RBC: 3.47 MIL/uL — ABNORMAL LOW (ref 3.87–5.11)
RDW: 14.4 % (ref 11.5–15.5)
WBC: 9.2 10*3/uL (ref 4.0–10.5)
nRBC: 0 % (ref 0.0–0.2)

## 2023-02-26 LAB — FERRITIN: Ferritin: 287 ng/mL (ref 11–307)

## 2023-02-26 LAB — BASIC METABOLIC PANEL
Anion gap: 5 (ref 5–15)
BUN: 16 mg/dL (ref 8–23)
CO2: 24 mmol/L (ref 22–32)
Calcium: 7.8 mg/dL — ABNORMAL LOW (ref 8.9–10.3)
Chloride: 101 mmol/L (ref 98–111)
Creatinine, Ser: 0.58 mg/dL (ref 0.44–1.00)
GFR, Estimated: >60 mL/min (ref >60)
Glucose, Bld: 93 mg/dL (ref 70–99)
Potassium: 3.5 mmol/L (ref 3.5–5.1)
Sodium: 130 mmol/L — ABNORMAL LOW (ref 135–145)

## 2023-02-26 MED ORDER — ASPIRIN 81 MG PO TBEC
81.0000 mg | DELAYED_RELEASE_TABLET | Freq: Every day | ORAL | Status: DC
  Administered 2023-02-26 – 2023-03-03 (×6): 81 mg via ORAL
  Filled 2023-02-26 (×6): qty 1

## 2023-02-26 MED ORDER — ENSURE ENLIVE PO LIQD
237.0000 mL | Freq: Two times a day (BID) | ORAL | Status: DC
  Administered 2023-02-26 – 2023-03-03 (×10): 237 mL via ORAL

## 2023-02-26 NOTE — Progress Notes (Signed)
Progress Note    02/26/2023 12:06 PM 1 Day Post-Op  Subjective:  This is a 88 y.o. female 88 year old female with history of lower extremity wounds, PAD, CAD, atrial fibrillation, congestive heart failure presents with lower extremity wounds getting worse. Patient was given doxycycline a few days ago for cellulitis but has gotten worse. In the ER she was placed on Rocephin.    On exam today patient is resting comfortably in bed and recovering as expected.  Enjoying lunch. She endorses her right lower extremity feels much better. Endorses ambulating 2 times a day in the halls. No complaints at this time. Vitals all remain stable and HGB remains stable at 9.3 today.     Vitals:   02/26/23 0727 02/26/23 1153  BP: 116/61 115/69  Pulse: 94 95  Resp: 16 20  Temp: 97.6 F (36.4 C) 98.1 F (36.7 C)  SpO2: 100% 100%   Physical Exam: Cardiac:   Irregular HX of AFIB, without murmurs rubs or gallops.  Lungs:   Nonlabored breathing without rales rhonchi.  Continued wheezing decreased bilateral lower lung sounds Incisions:  Right groin Dressing intact clean and dry. No hematoma, seroma or infection to note.  Extremities:  Palpable upper extremity pulses. Unable to palpate bilateral lower extremity pulses. No Doppler available. Bilateral lower extremities are cool to touch.  Abdomen:  Positive bowel sounds, soft, flat, nontender, nondistended.  Neurologic:  Alert and oriented x 3, no focal weakness or paresthesias detected.  Speech is fluent.  Follows all commands appropriately.   CBC    Component Value Date/Time   WBC 9.2 02/26/2023 0433   RBC 3.47 (L) 02/26/2023 0433   HGB 9.3 (L) 02/26/2023 0433   HGB 10.0 (L) 08/04/2020 1209   HCT 29.7 (L) 02/26/2023 0433   HCT 32.6 (L) 08/04/2020 1209   PLT 312 02/26/2023 0433   PLT 304 08/04/2020 1209   MCV 85.6 02/26/2023 0433   MCV 85 08/04/2020 1209   MCH 26.8 02/26/2023 0433   MCHC 31.3 02/26/2023 0433   RDW 14.4 02/26/2023 0433   RDW 12.7  08/04/2020 1209   LYMPHSABS 0.5 (L) 02/14/2023 1411   LYMPHSABS 1.0 08/04/2020 1209   MONOABS 0.7 02/14/2023 1411   EOSABS 0.0 02/14/2023 1411   EOSABS 0.0 08/04/2020 1209   BASOSABS 0.0 02/14/2023 1411   BASOSABS 0.0 08/04/2020 1209    BMET    Component Value Date/Time   NA 130 (L) 02/26/2023 0433   K 3.5 02/26/2023 0433   CL 101 02/26/2023 0433   CO2 24 02/26/2023 0433   GLUCOSE 93 02/26/2023 0433   BUN 16 02/26/2023 0433   CREATININE 0.58 02/26/2023 0433   CALCIUM 7.8 (L) 02/26/2023 0433   GFRNONAA >60 02/26/2023 0433   GFRAA >60 06/14/2020 0547    INR    Component Value Date/Time   INR 1.1 02/14/2023 1411     Intake/Output Summary (Last 24 hours) at 02/26/2023 1206 Last data filed at 02/26/2023 0935 Gross per 24 hour  Intake 960 ml  Output 150 ml  Net 810 ml     Assessment/Plan:  88 y.o. female is s/p right lower extremity angiogram with angioplasty and stent placement x 2 1 Day Post-Op   Plan: Patient needs to be on ASA 81 mg daily and Plavix 75 mg daily now due to stent placement x 2 to right lower extremity for revascularization.  In the setting of patient not tolerating aspirin patient will need to be placed on Eliquis 2.5 mg twice daily  with Plavix 75 mg daily as an alternative.  DVT prophylaxis:  ASA 81 mg and Plavix 75 mg daily.   Marcie Bal [facility] 02/26/2023 12:06 PM

## 2023-02-26 NOTE — Progress Notes (Signed)
Mobility Specialist - Progress Note   02/26/23 1600  Mobility  Activity Ambulated with assistance in hallway  Level of Assistance Standby assist, set-up cues, supervision of patient - no hands on  Assistive Device Front wheel walker  Distance Ambulated (ft) 160 ft  Activity Response Tolerated well  $Mobility charge 1 Mobility     Pt lying in bed upon arrival, utilizing RA. Pt reluctant on ambulation again this date, but then quickly agreeable without encouragement needed. Pt completed bed mobility modI; STS with minA; ambulation with minG. VC for keeping RW close to body and maintaining upward gaze. Pt left in bed with alarm set, needs in reach.    [name] [name] Mobility Specialist 02/26/23, 4:07 PM

## 2023-02-26 NOTE — Progress Notes (Signed)
Progress Note   Patient: [name] [name] WUJ:[id number] DOB: 1935-10-23 DOA: 02/20/2023     6 DOS: the patient was seen and examined on 02/26/2023   Brief hospital course: 88 year old female with history of lower extremity wounds, PAD, CAD, atrial fibrillation, congestive heart failure presents with lower extremity wounds getting worse.  Patient was given doxycycline a few days ago for cellulitis but has gotten worse.  In the ER she was placed on Rocephin. Patient developed upper GI bleed on 4/12 with a hemoglobin dropped down to 7.3, she was placed on Protonix IV, received 1 units of PRBC, EGD was performed on 4/15, showed multiple angiodysplastic lesions, cauterized. Patient undergone right lower extremity angiogram and angioplasty with stent on 4/16, restarted aspirin Plavix post procedure.     Principal Problem:   Upper GI bleeding Active Problems:   Acute blood loss anemia   Cellulitis of multiple sites of lower extremity   Toe necrosis   AKI (acute kidney injury)   Atrial fibrillation, chronic   Hypertension   Coronary artery disease of native artery of native heart with stable angina pectoris   Tobacco abuse   Hyponatremia   Protein-calorie malnutrition, severe   Chronic systolic CHF (congestive heart failure)   Melena   Cellulitis   Angiodysplasia of stomach   PVD (peripheral vascular disease)   Underweight   Assessment and Plan: * Upper GI bleeding secondary to AVM. Acute blood loss anemia Assumed B12 deficient anemia. Condition has improved, patient had received tachycardia red blood cell transfusion, she also received IV iron, she was started on B12 supplement admission. She had a borderline B12 level in 2021.  She probably has a true B12 deficiency. Patient appears to be improving, aspirin Plavix restarted per recommendation from vascular surgery.  Cellulitis of multiple sites of lower extremity Toe necrosis Peripheral arterial disease. Status post angioplasty  and stent.  Continue aspirin and Plavix. Antibiotics on Keflex, condition improving.  AKI (acute kidney injury) Hyponatremia Creatinine 1.1 on presentation down to 0.67.  Atrial fibrillation, chronic Anticoagulation, patient is on aspirin and Plavix.  Patient also has acute bleeding. On metoprolol for rate control.   Coronary artery disease of native artery of native heart with stable angina pectoris Hypertension Continue Toprol.  Add back lisinopril.   Tobacco abuse Nicotine patch.  Chronic systolic CHF (congestive heart failure) No current signs of heart failure.  Last echocardiogram back in 2021 showed an EF of 30%.  Patient currently euvolemic.  Severe protein calorie malnutrition. BMI 16.41, with significant muscle atrophy.  Start protein supplement.       Subjective:  Patient feels well today, denies any short of breath or cough.  No diarrhea.  Physical Exam: Vitals:   02/26/23 0322 02/26/23 0412 02/26/23 0727 02/26/23 1153  BP: (!) 164/63 (!) 137/52 116/61 115/69  Pulse: 86 74 94 95  Resp: Temp: 97.9 F (36.6 C)  97.6 F (36.4 C) 98.1 F (36.7 C)  TempSrc: Oral     SpO2: 100%  100% 100%  Weight:   43.4 kg   Height:       General exam: Appears calm and comfortable  Respiratory system: Clear to auscultation. Respiratory effort normal. Cardiovascular system: Irregular. No JVD, murmurs, rubs, gallops or clicks. No pedal edema. Gastrointestinal system: Abdomen is nondistended, soft and nontender. No organomegaly or masses felt. Normal bowel sounds heard. Central nervous system: Alert and oriented x3. No focal neurological deficits. Extremities: Symmetric 5 x 5 power. Skin: No rashes,  lesions or ulcers Psychiatry:  Mood & affect appropriate.    Data Reviewed:  Lab results reviewed.  Family Communication: sister updated over the phone  Disposition: Status is: Inpatient Remains inpatient appropriate because: Severity of  disease.     Time spent: 35 minutes  Author: Marrion Coy, MD 02/26/2023 2:59 PM  For on call review [email or web address].

## 2023-02-26 NOTE — Progress Notes (Signed)
Physical Therapy Treatment Patient Details Name: [name] [name] MRN: [id number] DOB: 1935/09/06 Today's Date: 02/26/2023   History of Present Illness Pt  is a 88 y.o. female 88 year old female with history of lower extremity wounds, PAD, CAD, atrial fibrillation, congestive heart failure presents with lower extremity wounds getting worse. Patient was given doxycycline a few days ago for cellulitis but has gotten worse. In the ER she was placed on Rocephin.    PT Comments    Patient received in bed on phone, reports she is "pissed off" because he wants to get up and walk. Patient is mod I with bed mobility, min guard with cues for transfers. Patient is able to ambulate 160 feet with RW and min guard. Cues for safe use of AD. She has no lob this session, required one brief standing rest break due to fatigue. Patient will continue to benefit from skilled PT to improve strength and safety with mobility. Frequency decreased this session as patient is now also seeing mobility specialist for ambulation.      Recommendations for follow up therapy are one component of a multi-disciplinary discharge planning process, led by the attending physician.  Recommendations may be updated based on patient status, additional functional criteria and insurance authorization.  Follow Up Recommendations       Assistance Recommended at Discharge Intermittent Supervision/Assistance  Patient can return home with the following A little help with walking and/or transfers;A little help with bathing/dressing/bathroom;Assistance with cooking/housework;Assist for transportation;Help with stairs or ramp for entrance   Equipment Recommendations  None recommended by PT    Recommendations for Other Services       Precautions / Restrictions Precautions Precautions: None Restrictions Weight Bearing Restrictions: No     Mobility  Bed Mobility Overal bed mobility: Modified Independent             General bed  mobility comments: no assist needed    Transfers Overall transfer level: Needs assistance Equipment used: Rolling walker (2 wheels) Transfers: Sit to/from Stand Sit to Stand: Min guard           General transfer comment: verbal cuing for correct technique with one hand on walker, one hand on bed.    Ambulation/Gait Ambulation/Gait assistance: Min guard Gait Distance (Feet): 160 Feet Assistive device: Rolling walker (2 wheels) Gait Pattern/deviations: Step-through pattern, Trunk flexed Gait velocity: decreased     General Gait Details: no lob, ambulates at slightly decreased pace, cues for staying inside RW and close to RW, one brief standing rest break   Stairs             Wheelchair Mobility    Modified Rankin (Stroke Patients Only)       Balance Overall balance assessment: Needs assistance Sitting-balance support: Feet supported Sitting balance-Leahy Scale: Good     Standing balance support: Bilateral upper extremity supported, During functional activity, Reliant on assistive device for balance Standing balance-Leahy Scale: Good Standing balance comment: no lob during transfers ir with gait, but reliant on B UE support ( this is her baseline)                            Cognition Arousal/Alertness: Awake/alert Behavior During Therapy: WFL for tasks assessed/performed Overall Cognitive Status: Within Functional Limits for tasks assessed  Exercises      General Comments        Pertinent Vitals/Pain Pain Assessment Pain Assessment: No/denies pain    Home Living                          Prior Function            PT Goals (current goals can now be found in the care plan section) Acute Rehab PT Goals Patient Stated Goal: to improve strength PT Goal Formulation: With patient Time For Goal Achievement: 03/08/23 Potential to Achieve Goals: Good Progress towards PT  goals: Progressing toward goals    Frequency    Min 2X/week      PT Plan Discharge plan needs to be updated    Co-evaluation              AM-PAC PT "6 Clicks" Mobility   Outcome Measure  Help needed turning from your back to your side while in a flat bed without using bedrails?: None Help needed moving from lying on your back to sitting on the side of a flat bed without using bedrails?: None Help needed moving to and from a bed to a chair (including a wheelchair)?: A Little Help needed standing up from a chair using your arms (e.g., wheelchair or bedside chair)?: A Little Help needed to walk in hospital room?: A Little Help needed climbing 3-5 steps with a railing? : A Lot 6 Click Score: 19    End of Session Equipment Utilized During Treatment: Gait belt Activity Tolerance: Patient tolerated treatment well Patient left: in bed;with call bell/phone within reach Nurse Communication: Mobility status PT Visit Diagnosis: Muscle weakness (generalized) (M62.81);Difficulty in walking, not elsewhere classified (R26.2)     Time: 1610-9604 PT Time Calculation (min) (ACUTE ONLY): 15 min  Charges:  $Gait Training: 8-22 mins                     Mikyle Sox, PT, GCS 02/26/23,10:52 AM

## 2023-02-26 NOTE — TOC Progression Note (Signed)
Transition of Care [facility]) - Progression Note    Patient Details  Name: [name] [name] MRN: [id number] Date of Birth: 1935/01/18  Transition of Care [facility]) CM/SW Contact  Truddie Hidden, RN Phone Number: 02/26/2023, 4:23 PM  Clinical Narrative:    Patient provided offers for [facility] and [facility] Patient is agreeable to [facility]. She was advised insurance approval would be needed.    Expected Discharge Plan: Skilled Nursing Facility Barriers to Discharge: Continued Medical Work up  Expected Discharge Plan and Services     Post Acute Care Choice: Skilled Nursing Facility Living arrangements for the past 2 months: Apartment                                       Social Determinants of Health (SDOH) Interventions SDOH Screenings   Housing: Low Risk  (12/25/2022)  Transportation Needs: No Transportation Needs (12/25/2022)  Depression (PHQ2-9): High Risk (12/25/2022)  Physical Activity: Insufficiently Active (12/25/2022)  Social Connections: Socially Isolated (12/25/2022)  Stress: Stress Concern Present (12/25/2022)  Tobacco Use: Medium Risk (02/26/2023)    Readmission Risk Interventions     No data to display

## 2023-02-27 DIAGNOSIS — I739 Peripheral vascular disease, unspecified: Secondary | ICD-10-CM | POA: Diagnosis not present

## 2023-02-27 DIAGNOSIS — D62 Acute posthemorrhagic anemia: Secondary | ICD-10-CM | POA: Diagnosis not present

## 2023-02-27 DIAGNOSIS — K922 Gastrointestinal hemorrhage, unspecified: Secondary | ICD-10-CM | POA: Diagnosis not present

## 2023-02-27 LAB — CBC
HCT: 28.8 % — ABNORMAL LOW (ref 36.0–46.0)
Hemoglobin: 9 g/dL — ABNORMAL LOW (ref 12.0–15.0)
MCH: 26.8 pg (ref 26.0–34.0)
MCHC: 31.3 g/dL (ref 30.0–36.0)
MCV: 85.7 fL (ref 80.0–100.0)
Platelets: 296 10*3/uL (ref 150–400)
RBC: 3.36 MIL/uL — ABNORMAL LOW (ref 3.87–5.11)
RDW: 14.6 % (ref 11.5–15.5)
WBC: 9.6 10*3/uL (ref 4.0–10.5)
nRBC: 0 % (ref 0.0–0.2)

## 2023-02-27 MED ORDER — TRAZODONE HCL 50 MG PO TABS
25.0000 mg | ORAL_TABLET | Freq: Every day | ORAL | Status: AC
  Administered 2023-02-27: 25 mg via ORAL
  Filled 2023-02-27: qty 1

## 2023-02-27 NOTE — Progress Notes (Signed)
Progress Note   Patient: [name] [name] [id number]:[id number] DOB: 1935-04-13 DOA: 02/20/2023     7 DOS: the patient was seen and examined on 02/27/2023   Brief hospital course: 88 year old female with history of lower extremity wounds, PAD, CAD, atrial fibrillation, congestive heart failure presents with lower extremity wounds getting worse.  Patient was given doxycycline a few days ago for cellulitis but has gotten worse.  In the ER she was placed on Rocephin. Patient developed upper GI bleed on 4/12 with a hemoglobin dropped down to 7.3, she was placed on Protonix IV, received 1 units of PRBC, EGD was performed on 4/15, showed multiple angiodysplastic lesions, cauterized. Patient undergone right lower extremity angiogram and angioplasty with stent on 4/16, restarted aspirin Plavix post procedure.     Principal Problem:   Upper GI bleeding Active Problems:   Acute blood loss anemia   Cellulitis of multiple sites of lower extremity   Toe necrosis   AKI (acute kidney injury)   Atrial fibrillation, chronic   Hypertension   Coronary artery disease of native artery of native heart with stable angina pectoris   Tobacco abuse   Hyponatremia   Protein-calorie malnutrition, severe   Chronic systolic CHF (congestive heart failure)   Melena   Cellulitis   Angiodysplasia of stomach   PVD (peripheral vascular disease)   Underweight   Assessment and Plan: Upper GI bleeding secondary to AVM. Acute blood loss anemia Assumed B12 deficient anemia. Condition has improved, patient had received  red blood cell transfusion, she also received IV iron, she was started on B12 supplement at admission. She had a borderline B12 level in 2021.  She probably has a true B12 deficiency. Patient appears to be improving, aspirin Plavix restarted per recommendation from vascular surgery.   Cellulitis of multiple sites of lower extremity Toe necrosis Peripheral arterial disease. Status post angioplasty and stent.   Continue aspirin and Plavix. Antibiotics on Keflex, condition improving.   AKI (acute kidney injury) Hyponatremia Creatinine 1.1 on presentation down to 0.67.   Atrial fibrillation, chronic Anticoagulation, patient is on aspirin and Plavix.  Patient also has acute bleeding. On metoprolol for rate control.     Coronary artery disease of native artery of native heart with stable angina pectoris Hypertension Continue Toprol.  Add back lisinopril.     Tobacco abuse Nicotine patch.   Chronic systolic CHF (congestive heart failure) No current signs of heart failure.  Last echocardiogram back in 2021 showed an EF of 30%.  Patient currently euvolemic.   Severe protein calorie malnutrition. BMI 16.41, with significant muscle atrophy.  Start protein supplement.      Subjective:  Patient doing well, she was able to walk with physical therapy yesterday.  Physical Exam: Vitals:   02/26/23 2330 02/27/23 0358 02/27/23 0749 02/27/23 0911  BP: (!) 139/46 (!) 143/60 (!) 142/54   Pulse: 73 77 94   Resp: Temp: 98.9 F (37.2 C) 97.9 F (36.6 C) 98 F (36.7 C)   TempSrc:   Oral   SpO2: 97% 99% 100%   Weight:    44 kg  Height:       General exam: Appears calm and comfortable, malnourished. Respiratory system: Clear to auscultation. Respiratory effort normal. Cardiovascular system: S1 & S2 heard, RRR. No JVD, murmurs, rubs, gallops or clicks. No pedal edema. Gastrointestinal system: Abdomen is nondistended, soft and nontender. No organomegaly or masses felt. Normal bowel sounds heard. Central nervous system: Alert and oriented. No focal neurological  deficits. Extremities: Muscle atrophy. Skin: No rashes, lesions or ulcers Psychiatry: Judgement and insight appear normal. Mood & affect appropriate.    Data Reviewed:  Reviewed lab results  Family Communication: None  Disposition: Status is: Inpatient Remains inpatient appropriate because: Unsafe discharge pending  nursing home placement.     Time spent: 35 minutes  Author: Marrion Coy, MD 02/27/2023 12:59 PM  For on call review [email or web address].

## 2023-02-27 NOTE — Care Management Important Message (Signed)
Important Message  Patient Details  Name: [name] [name] MRN: [id number] Date of Birth: 10/17/1935   Medicare Important Message Given:  Yes     [name] 02/27/2023, 1:20 PM

## 2023-02-27 NOTE — Progress Notes (Signed)
       CROSS COVER NOTE  NAME: [name] [name] MRN: [id number] DOB : [birth year]    HPI/Events of Note   Report:sleep aid requested and has used melatonin in past that was  not effective for her  On review of chart:    Assessment and  Interventions   Assessment:  Plan: Trazodone 25 mg x 1 dose X X      Donnie Mesa NP [facility]

## 2023-02-27 NOTE — Progress Notes (Signed)
SATURATION QUALIFICATIONS: (This note is used to comply with regulatory documentation for home oxygen)  Patient Saturations on Room Air at Rest = 97%  Patient Saturations on Room Air while Ambulating = 98%  Please briefly explain why patient needs home oxygen: 

## 2023-02-27 NOTE — TOC Progression Note (Addendum)
Transition of Care [facility]) - Progression Note    Patient Details  Name: [name] [name] MRN: [id number] Date of Birth: [birth year]  Transition of Care [facility]) CM/SW Contact  Truddie Hidden, RN Phone Number: 02/27/2023, 2:42 PM  Clinical Narrative:    [name] started in Navi portal.  4:24pm  Retrieved VM from Nicole Cella at [facility] stating case is being reviewed by the medical  director and may need a peer to peer review.    Expected Discharge Plan: Skilled Nursing Facility Barriers to Discharge: Continued Medical Work up  Expected Discharge Plan and Services     Post Acute Care Choice: Skilled Nursing Facility Living arrangements for the past 2 months: Apartment                                       Social Determinants of Health (SDOH) Interventions SDOH Screenings   Housing: Low Risk  (12/25/2022)  Transportation Needs: No Transportation Needs (12/25/2022)  Depression (PHQ2-9): High Risk (12/25/2022)  Physical Activity: Insufficiently Active (12/25/2022)  Social Connections: Socially Isolated (12/25/2022)  Stress: Stress Concern Present (12/25/2022)  Tobacco Use: Medium Risk (02/26/2023)    Readmission Risk Interventions     No data to display

## 2023-02-27 NOTE — Progress Notes (Signed)
Mobility Specialist - Progress Note   02/27/23 1000  Mobility  Activity Ambulated with assistance in hallway  Level of Assistance Standby assist, set-up cues, supervision of patient - no hands on  Assistive Device Front wheel walker  Distance Ambulated (ft) 160 ft  Activity Response Tolerated well  $Mobility charge 1 Mobility     Pt lying in bed upon arrival, utilizing RA. Pt motivated and agreeable to activity. Completed bed mobility modI and STS with minA. Pt ambulated in hallway with minG. VC for staying close and inside RW, especially during turns. Pt left EOB, at pt request, with alarm set and needs in reach. Nursing staff made aware.    Filiberto Pinks Mobility Specialist 02/27/23, 10:52 AM

## 2023-02-27 NOTE — Progress Notes (Signed)
Progress Note    02/27/2023 12:11 PM 2 Days Post-Op  Subjective:  This is a 88 y.o.female with a history of lower extremity wounds, PAD, CAD, atrial fibrillation, congestive heart failure presents with lower extremity wounds getting worse. Patient was given doxycycline a few days ago for cellulitis but has gotten worse. In the ER she was placed on Rocephin.    On exam today patient is resting comfortably in bed and recovering as expected. She endorses her right lower extremity feels much better. Endorses ambulating 3 times a day in the halls. No complaints at this time. Vitals all remain stable and HGB remains stable at 9.3 today.     Vitals:   02/27/23 0358 02/27/23 0749  BP: (!) 143/60 (!) 142/54  Pulse: 77 94  Resp: 18 16  Temp: 97.9 F (36.6 C) 98 F (36.7 C)  SpO2: 99% 100%   Physical Exam: Cardiac:  Irregular HX of AFIB, without murmurs rubs or gallops.  Lungs:  Nonlabored breathing without rales rhonchi. Continued wheezing decreased bilateral lower lung sounds  Incisions:   Right groin Dressing intact clean and dry. No hematoma, seroma or infection to note.  Extremities:  Palpable upper extremity pulses. Unable to palpate bilateral lower extremity pulses. No Doppler available. Bilateral lower extremities are cool to touch.  Abdomen:  Positive bowel sounds, soft, flat, nontender, nondistended.  Neurologic:  Alert and oriented x 3, no focal weakness or paresthesias detected.  Speech is fluent.  Follows all commands appropriately.   CBC    Component Value Date/Time   WBC 9.6 02/27/2023 0439   RBC 3.36 (L) 02/27/2023 0439   HGB 9.0 (L) 02/27/2023 0439   HGB 10.0 (L) 08/04/2020 1209   HCT 28.8 (L) 02/27/2023 0439   HCT 32.6 (L) 08/04/2020 1209   PLT 296 02/27/2023 0439   PLT 304 08/04/2020 1209   MCV 85.7 02/27/2023 0439   MCV 85 08/04/2020 1209   MCH 26.8 02/27/2023 0439   MCHC 31.3 02/27/2023 0439   RDW 14.6 02/27/2023 0439   RDW 12.7 08/04/2020 1209   LYMPHSABS  0.5 (L) 02/14/2023 1411   LYMPHSABS 1.0 08/04/2020 1209   MONOABS 0.7 02/14/2023 1411   EOSABS 0.0 02/14/2023 1411   EOSABS 0.0 08/04/2020 1209   BASOSABS 0.0 02/14/2023 1411   BASOSABS 0.0 08/04/2020 1209    BMET    Component Value Date/Time   NA 130 (L) 02/26/2023 0433   K 3.5 02/26/2023 0433   CL 101 02/26/2023 0433   CO2 24 02/26/2023 0433   GLUCOSE 93 02/26/2023 0433   BUN 16 02/26/2023 0433   CREATININE 0.58 02/26/2023 0433   CALCIUM 7.8 (L) 02/26/2023 0433   GFRNONAA >60 02/26/2023 0433   GFRAA >60 06/14/2020 0547    INR    Component Value Date/Time   INR 1.1 02/14/2023 1411     Intake/Output Summary (Last 24 hours) at 02/27/2023 1211 Last data filed at 02/27/2023 1058 Gross per 24 hour  Intake 956 ml  Output --  Net 956 ml     Assessment/Plan:  88 y.o. female is s/p right lower extremity angiogram with angioplasty and stent placement x 2  2 Days Post-Op   PLAN: Patient to be discharged on ASA 81 mg Daily and Plavix 75 mg Daily Patient to be discharged to SNF when approved. Patient to follow up with Vascular Surgery in 6 weeks with Lower extremity Ultrasounds with ABI's   DVT prophylaxis:  ASA 81 mg daily and Plavix 75 mg Daily  Marcie Bal [facility] 02/27/2023 12:11 PM

## 2023-02-28 LAB — CBC
HCT: 27.1 % — ABNORMAL LOW (ref 36.0–46.0)
Hemoglobin: 8.3 g/dL — ABNORMAL LOW (ref 12.0–15.0)
MCH: 26.5 pg (ref 26.0–34.0)
MCHC: 30.6 g/dL (ref 30.0–36.0)
MCV: 86.6 fL (ref 80.0–100.0)
Platelets: 285 10*3/uL (ref 150–400)
RBC: 3.13 MIL/uL — ABNORMAL LOW (ref 3.87–5.11)
RDW: 15.1 % (ref 11.5–15.5)
WBC: 9 10*3/uL (ref 4.0–10.5)
nRBC: 0 % (ref 0.0–0.2)

## 2023-02-28 LAB — BASIC METABOLIC PANEL
Anion gap: 6 (ref 5–15)
BUN: 29 mg/dL — ABNORMAL HIGH (ref 8–23)
CO2: 25 mmol/L (ref 22–32)
Calcium: 7.9 mg/dL — ABNORMAL LOW (ref 8.9–10.3)
Chloride: 100 mmol/L (ref 98–111)
Creatinine, Ser: 0.6 mg/dL (ref 0.44–1.00)
GFR, Estimated: >60 mL/min (ref >60)
Glucose, Bld: 88 mg/dL (ref 70–99)
Potassium: 4.3 mmol/L (ref 3.5–5.1)
Sodium: 131 mmol/L — ABNORMAL LOW (ref 135–145)

## 2023-02-28 NOTE — Consult Note (Signed)
   [facility] CM Inpatient Consult   02/28/2023  [name] [name] [birth year] [id number]     Location: [facility] RN Hospital Liaison Screened remotely [facility]).   [facility] Customer service manager [facility]) Accountable Care Organization [ACO] Patient: Insurance [facility])   Primary Care Provider:  Miki Kins, FNP with [facility]  Patient screened for readmission hospitalization with noted medium risk score for unplanned readmission risk with 1 IP/1 ED in 6 months. [facility]/Population Health RN liaison will assess for potential [facility]) Care Management service needs for post hospital transition for care coordination.   Plan. [facility]/Population Health RN Liaison will continue to follow ongoing disposition in assessing for post hospital community care coordination/management needs.  Referral request for community care coordination: Anticipate [facility] TOC will follow up post hospital discharge if no other needs arise.   [facility] Care Management/Population Health does not replace or interfere with any arrangements made by the Inpatient Transition of Care team.   For questions contact:       Elliot Cousin, RN, BSN [facility] Liaison [place]   [facility]  Population Health Office Hours MTWF 8:00 am to 6 pm off on Thursday [phone] mobile [phone] [Office toll free line][facility] Office Hours are M-F 8:30 - 5 pm 24 hour nurse advise line [phone] Conceirge  [email or web address]

## 2023-02-28 NOTE — Progress Notes (Signed)
Progress Note   Patient: [name] [name] [id number]:[id number] DOB: [birth year] DOA: 02/20/2023     8 DOS: the patient was seen and examined on 02/28/2023   Brief hospital course: 88 year old female with history of lower extremity wounds, PAD, CAD, atrial fibrillation, congestive heart failure presents with lower extremity wounds getting worse.  Patient was given doxycycline a few days ago for cellulitis but has gotten worse.  In the ER she was placed on Rocephin. Patient developed upper GI bleed on 4/12 with a hemoglobin dropped down to 7.3, she was placed on Protonix IV, received 1 units of PRBC, EGD was performed on 4/15, showed multiple angiodysplastic lesions, cauterized. Patient undergone right lower extremity angiogram and angioplasty with stent on 4/16, restarted aspirin Plavix post procedure.     Principal Problem:   Upper GI bleeding Active Problems:   Acute blood loss anemia   Cellulitis of multiple sites of lower extremity   Toe necrosis   AKI (acute kidney injury)   Atrial fibrillation, chronic   Hypertension   Coronary artery disease of native artery of native heart with stable angina pectoris   Tobacco abuse   Hyponatremia   Protein-calorie malnutrition, severe   Chronic systolic CHF (congestive heart failure)   Melena   Cellulitis   Angiodysplasia of stomach   PVD (peripheral vascular disease)   Underweight   Assessment and Plan: Upper GI bleeding secondary to AVM. Acute blood loss anemia Assumed B12 deficient anemia. Condition has improved, patient had received  red blood cell transfusion, she also received IV iron, she was started on B12 supplement at admission. She had a borderline B12 level in 2021.  She probably has a true B12 deficiency. Patient appears to be improving, aspirin Plavix restarted per recommendation from vascular surgery. Recheck CBC tomorrow.  Cellulitis of multiple sites of lower extremity Toe necrosis Peripheral arterial disease. Status post  angioplasty and stent.  Continue aspirin and Plavix. Antibiotics on Keflex, condition improving.  Will continue for 3 more days.   AKI (acute kidney injury) Hyponatremia Creatinine 1.1 on presentation down to 0.67.   Atrial fibrillation, chronic No anticoagulation, patient is on aspirin and Plavix.  Patient also has acute bleeding. On metoprolol for rate control.     Coronary artery disease of native artery of native heart with stable angina pectoris Hypertension Continue Toprol.  Added back lisinopril.     Tobacco abuse Nicotine patch.   Chronic systolic CHF (congestive heart failure) No current signs of heart failure.  Last echocardiogram back in 2021 showed an EF of 30%.  Patient currently euvolemic.   Severe protein calorie malnutrition. BMI 16.41, with significant muscle atrophy.  Start protein supplement.            Subjective:  Patient does not have any new complaints.    Physical Exam: Vitals:   02/28/23 0036 02/28/23 0535 02/28/23 0810 02/28/23 1314  BP: (!) 117/55 (!) 125/55  (!) 125/52  Pulse: 78 76  72  Resp: Temp: 98.4 F (36.9 C) 98.8 F (37.1 C)  98.3 F (36.8 C)  TempSrc:      SpO2: 97% 97%  100%  Weight:   45.1 kg   Height:       General exam: Appears calm and comfortable  Respiratory system: Clear to auscultation. Respiratory effort normal. Cardiovascular system: S1 & S2 heard, RRR. No JVD, murmurs, rubs, gallops or clicks. No pedal edema. Gastrointestinal system: Abdomen is nondistended, soft and nontender. No organomegaly or masses felt.  Normal bowel sounds heard. Central nervous system: Alert and oriented. No focal neurological deficits. Extremities: Symmetric 5 x 5 power. Skin: No rashes, lesions or ulcers Psychiatry: Judgement and insight appear normal. Mood & affect appropriate.    Data Reviewed:  Lab results reviewed.  Family Communication: None  Disposition: Status is: Inpatient Remains inpatient appropriate  because: Unsafe discharge, pending nursing home placement.     Time spent: 25 minutes  Author: Marrion Coy, MD 02/28/2023 1:53 PM  For on call review [email or web address].

## 2023-02-28 NOTE — TOC Progression Note (Addendum)
Transition of Care [facility]) - Progression Note    Patient Details  Name: [name] [name] MRN: [id number] Date of Birth: Feb 15, 1935  Transition of Care [facility]) CM/SW Contact  Truddie Hidden, RN Phone Number: 02/28/2023, 2:01 PM  Clinical Narrative:    Navi portal checked for updates. No new updates available.  Contacted patient's sister, [name] to advised patient [name] is still pending and is being reviewed by the Wellsite geologist. [name] stated patient is not safe to live alone. [name] is unable to enter the patient's apartment due to  her husbands cancer diagnosis. She stated ALF may be an option, and is agreeable to referral to [facility] for ALF assistance. [name] was advised patient would have to private pay or apply for LTSS with DSS.  Referral faxed to  [facility] from [facility] @ [phone]   Expected Discharge Plan: Skilled Nursing Facility Barriers to Discharge: Continued Medical Work up  Expected Discharge Plan and Services     Post Acute Care Choice: Skilled Nursing Facility Living arrangements for the past 2 months: Apartment                                       Social Determinants of Health (SDOH) Interventions SDOH Screenings   Housing: Low Risk  (12/25/2022)  Transportation Needs: No Transportation Needs (12/25/2022)  Depression (PHQ2-9): High Risk (12/25/2022)  Physical Activity: Insufficiently Active (12/25/2022)  Social Connections: Socially Isolated (12/25/2022)  Stress: Stress Concern Present (12/25/2022)  Tobacco Use: Medium Risk (02/26/2023)    Readmission Risk Interventions     No data to display

## 2023-02-28 NOTE — Progress Notes (Signed)
Physical Therapy Treatment Patient Details Name: [name] [name] MRN: [id number] DOB: [birth year] Today's Date: 02/28/2023   History of Present Illness Pt  is a 88 y.o. female 88 year old female with history of lower extremity wounds, PAD, CAD, atrial fibrillation, congestive heart failure presents with lower extremity wounds getting worse. Patient was given doxycycline a few days ago for cellulitis but has gotten worse. In the ER she was placed on Rocephin.    PT Comments    Therapist in this am to assist with OOB to [facility] with CGA and assist for hygiene. Pt upset about not understanding current POC and when she will transition to Rehab. Therapist returned this pm for progressed mobility after speaking with case manager regarding insurance approval. Pt overall is able to transfer supine<>sit with Supervision. Transfers currently require CG/MinA depending on height of surface. Pt able to demonstrate ~135ft of gait training with CGA and repeated vc's to stay inside AD and improve safety awareness to prevent falls. Pt currently has decreased strength throughout and will benefit from STR to regain previous independent LOF.    Recommendations for follow up therapy are one component of a multi-disciplinary discharge planning process, led by the attending physician.  Recommendations may be updated based on patient status, additional functional criteria and insurance authorization.  Follow Up Recommendations  Can patient physically be transported by private vehicle: Yes    Assistance Recommended at Discharge Intermittent Supervision/Assistance  Patient can return home with the following A little help with walking and/or transfers;A little help with bathing/dressing/bathroom;Assistance with cooking/housework;Assist for transportation;Help with stairs or ramp for entrance   Equipment Recommendations  None recommended by PT    Recommendations for Other Services       Precautions / Restrictions  Precautions Precautions: None Restrictions Weight Bearing Restrictions: No     Mobility  Bed Mobility Overal bed mobility: Modified Independent             General bed mobility comments: no assist needed    Transfers Overall transfer level: Needs assistance Equipment used: Rolling walker (2 wheels) Transfers: Sit to/from Stand Sit to Stand: Min guard           General transfer comment: verbal cuing for correct technique with one hand on walker, one hand on bed.    Ambulation/Gait Ambulation/Gait assistance: Min guard Gait Distance (Feet): 160 Feet Assistive device: Rolling walker (2 wheels) Gait Pattern/deviations: Step-through pattern, Decreased step length - right, Decreased step length - left, Narrow base of support, Drifts right/left Gait velocity: decreased     General Gait Details: no lob, ambulates at slightly decreased pace, cues for staying inside RW and close to RW, one brief standing rest break   Stairs             Wheelchair Mobility    Modified Rankin (Stroke Patients Only)       Balance Overall balance assessment: Needs assistance Sitting-balance support: Feet supported Sitting balance-Leahy Scale: Good   Postural control: Posterior lean Standing balance support: Bilateral upper extremity supported, During functional activity, Reliant on assistive device for balance Standing balance-Leahy Scale: Good Standing balance comment: no lob during transfers ir with gait, but reliant on B UE support ( this is her baseline)                            Cognition Arousal/Alertness: Awake/alert Behavior During Therapy: WFL for tasks assessed/performed Overall Cognitive Status: Within Functional Limits for tasks assessed  General Comments:  (Very HOH and gets things easily confused if speaker is not clear)        Exercises General Exercises - Lower Extremity Ankle Circles/Pumps:  AROM, Both, 10 reps, Supine Long Arc Quad: AROM, Both, 10 reps, Seated Hip Flexion/Marching: AROM, Both, 10 reps, Seated    General Comments General comments (skin integrity, edema, etc.):  (R LE lower leg and L foot gauze wrapped)      Pertinent Vitals/Pain Pain Assessment Pain Assessment: No/denies pain    Home Living                          Prior Function            PT Goals (current goals can now be found in the care plan section) Acute Rehab PT Goals Patient Stated Goal: to improve strength Progress towards PT goals: Progressing toward goals    Frequency    Min 2X/week      PT Plan Discharge plan needs to be updated    Co-evaluation              AM-PAC PT "6 Clicks" Mobility   Outcome Measure  Help needed turning from your back to your side while in a flat bed without using bedrails?: None Help needed moving from lying on your back to sitting on the side of a flat bed without using bedrails?: None Help needed moving to and from a bed to a chair (including a wheelchair)?: A Little Help needed standing up from a chair using your arms (e.g., wheelchair or bedside chair)?: A Little Help needed to walk in hospital room?: A Little Help needed climbing 3-5 steps with a railing? : A Lot 6 Click Score: 19    End of Session Equipment Utilized During Treatment: Gait belt Activity Tolerance: Patient tolerated treatment well Patient left: in bed;with call bell/phone within reach Nurse Communication: Mobility status PT Visit Diagnosis: Muscle weakness (generalized) (M62.81);Difficulty in walking, not elsewhere classified (R26.2)     Time: 1610-9604 PT Time Calculation (min) (ACUTE ONLY): 41 min  Charges:                       Zadie Cleverly, PTA  Jannet Askew 02/28/2023, 3:32 PM

## 2023-02-28 NOTE — Progress Notes (Signed)
Mobility Specialist - Progress Note   02/28/23 1100  Mobility  Activity Refused mobility     Pt declined mobility, no reason specified. Pt upset regarding d/c plan. "They told me if I get up and walk I can go home, but now they are telling me something else so I'm not walking anymore!" Will attempt another date/time as pt is agreeable.    Filiberto Pinks Mobility Specialist 02/28/23, 11:46 AM

## 2023-03-01 LAB — CBC
HCT: 30.2 % — ABNORMAL LOW (ref 36.0–46.0)
Hemoglobin: 9 g/dL — ABNORMAL LOW (ref 12.0–15.0)
MCH: 26.7 pg (ref 26.0–34.0)
MCHC: 29.8 g/dL — ABNORMAL LOW (ref 30.0–36.0)
MCV: 89.6 fL (ref 80.0–100.0)
Platelets: 279 10*3/uL (ref 150–400)
RBC: 3.37 MIL/uL — ABNORMAL LOW (ref 3.87–5.11)
RDW: 15.1 % (ref 11.5–15.5)
WBC: 7.7 10*3/uL (ref 4.0–10.5)
nRBC: 0 % (ref 0.0–0.2)

## 2023-03-01 MED ORDER — PANTOPRAZOLE SODIUM 40 MG PO TBEC
40.0000 mg | DELAYED_RELEASE_TABLET | Freq: Two times a day (BID) | ORAL | Status: DC
  Administered 2023-03-01 – 2023-03-03 (×4): 40 mg via ORAL
  Filled 2023-03-01 (×4): qty 1

## 2023-03-01 NOTE — Progress Notes (Signed)
Progress Note   Patient: [name] [name]:[id number] DOB: 07-Aug-1935 DOA: 02/20/2023     9 DOS: the patient was seen and examined on 03/01/2023   Brief hospital course: 88 year old female with history of lower extremity wounds, PAD, CAD, atrial fibrillation, congestive heart failure presents with lower extremity wounds getting worse.  Patient was given doxycycline a few days ago for cellulitis but has gotten worse.  In the ER she was placed on Rocephin. Patient developed upper GI bleed on 4/12 with a hemoglobin dropped down to 7.3, she was placed on Protonix IV, received 1 units of PRBC, EGD was performed on 4/15, showed multiple angiodysplastic lesions, cauterized. Patient undergone right lower extremity angiogram and angioplasty with stent on 4/16, restarted aspirin Plavix post procedure.     Principal Problem:   Upper GI bleeding Active Problems:   Acute blood loss anemia   Cellulitis of multiple sites of lower extremity   Toe necrosis   AKI (acute kidney injury)   Atrial fibrillation, chronic   Hypertension   Coronary artery disease of native artery of native heart with stable angina pectoris   Tobacco abuse   Hyponatremia   Protein-calorie malnutrition, severe   Chronic systolic CHF (congestive heart failure)   Melena   Cellulitis   Angiodysplasia of stomach   PVD (peripheral vascular disease)   Underweight   Assessment and Plan:  Upper GI bleeding secondary to AVM. Acute blood loss anemia Assumed B12 deficient anemia. Condition has improved, patient had received  red blood cell transfusion, she also received IV iron, she was started on B12 supplement at admission. She had a borderline B12 level in 2021.  She probably has a true B12 deficiency. Patient appears to be improving, aspirin Plavix restarted per recommendation from vascular surgery. Hemoglobin has been more stable.   Cellulitis of multiple sites of lower extremity Toe necrosis Peripheral arterial  disease. Status post angioplasty and stent.  Continue aspirin and Plavix. Antibiotics on Keflex, condition improving.     AKI (acute kidney injury) Hyponatremia Creatinine 1.1 on presentation down to 0.67.   Atrial fibrillation, chronic No anticoagulation, patient is on aspirin and Plavix.  Patient also has acute bleeding. On metoprolol for rate control.     Coronary artery disease of native artery of native heart with stable angina pectoris Hypertension Continue Toprol.  Added back lisinopril.     Tobacco abuse Nicotine patch.   Chronic systolic CHF (congestive heart failure) No current signs of heart failure.  Last echocardiogram back in 2021 showed an EF of 30%.  Patient currently euvolemic.   Severe protein calorie malnutrition. BMI 16.41, with significant muscle atrophy.  Start protein supplement.       Subjective:  No new issue, pending nursing placement.  Physical Exam: Vitals:   03/01/23 0306 03/01/23 0525 03/01/23 0726 03/01/23 1115  BP: (!) 125/53  (!) 145/62 (!) 143/60  Pulse: 71  68 68  Resp: Temp: 98.1 F (36.7 C)  97.7 F (36.5 C) 98.4 F (36.9 C)  TempSrc:      SpO2: 98%  100% 100%  Weight:  46.1 kg    Height:       General exam: Appears calm and comfortable  Respiratory system: Clear to auscultation. Respiratory effort normal. Cardiovascular system: S1 & S2 heard, RRR. No JVD, murmurs, rubs, gallops or clicks. No pedal edema. Gastrointestinal system: Abdomen is nondistended, soft and nontender. No organomegaly or masses felt. Normal bowel sounds heard. Central nervous system: Alert and  oriented. No focal neurological deficits. Extremities: Symmetric 5 x 5 power. Skin: No rashes, lesions or ulcers Psychiatry: Judgement and insight appear normal. Mood & affect appropriate.    Data Reviewed:  Lab results reviewed.  Family Communication: None  Disposition: Status is: Inpatient Remains inpatient appropriate because: Unsafe  discharge.     Time spent: 25 minutes  Author: Marrion Coy, MD 03/01/2023 1:37 PM  For on call review [email or web address].

## 2023-03-01 NOTE — Progress Notes (Addendum)
PHARMACIST - PHYSICIAN COMMUNICATION  DR:   Chipper Herb  CONCERNING: IV to Oral Route Change Policy  RECOMMENDATION: This patient is receiving Pantoprazole by the intravenous route.  Based on criteria approved by the Pharmacy and Therapeutics Committee, the intravenous medication(s) is/are being converted to the equivalent oral dose form(s).   DESCRIPTION: These criteria include: The patient is eating (either orally or via tube) and/or has been taking other orally administered medications for a least 24 hours The patient has no evidence of active gastrointestinal bleeding or impaired GI absorption (gastrectomy, short bowel, patient on TNA or NPO). EDG report angiodysplasia of stomach and duodenum without bleeding  If you have questions about this conversion, please contact the Pharmacy Department    [phone] )  [facility]   [phone] )  [facility]   [phone] )  [facility]   [phone] )  [facility]   [phone] )  [facility]   Brandonlee Navis Rodriguez-Guzman PharmD, BCPS 03/01/2023 9:33 AM

## 2023-03-02 NOTE — Progress Notes (Signed)
Progress Note   Patient: [name] [name] ZOX:[id number] DOB: [birth year] DOA: 02/20/2023     10 DOS: the patient was seen and examined on 03/02/2023   Brief hospital course: 88 year old female with history of lower extremity wounds, PAD, CAD, atrial fibrillation, congestive heart failure presents with lower extremity wounds getting worse.  Patient was given doxycycline a few days ago for cellulitis but has gotten worse.  In the ER she was placed on Rocephin. Patient developed upper GI bleed on 4/12 with a hemoglobin dropped down to 7.3, she was placed on Protonix IV, received 1 units of PRBC, EGD was performed on 4/15, showed multiple angiodysplastic lesions, cauterized. Patient undergone right lower extremity angiogram and angioplasty with stent on 4/16, restarted aspirin Plavix post procedure.     Principal Problem:   Upper GI bleeding Active Problems:   Acute blood loss anemia   Cellulitis of multiple sites of lower extremity   Toe necrosis   AKI (acute kidney injury)   Atrial fibrillation, chronic   Hypertension   Coronary artery disease of native artery of native heart with stable angina pectoris   Tobacco abuse   Hyponatremia   Protein-calorie malnutrition, severe   Chronic systolic CHF (congestive heart failure)   Melena   Cellulitis   Angiodysplasia of stomach   PVD (peripheral vascular disease)   Underweight   Assessment and Plan: Upper GI bleeding secondary to AVM. Acute blood loss anemia Assumed B12 deficient anemia. Condition has improved, patient had received  red blood cell transfusion, she also received IV iron, she was started on B12 supplement at admission. She had a borderline B12 level in 2021.  She probably has a true B12 deficiency. Patient appears to be improving, aspirin Plavix restarted per recommendation from vascular surgery. Condition has stabilized.  Hemoglobin stable.   Cellulitis of multiple sites of lower extremity Toe necrosis Peripheral  arterial disease. Status post angioplasty and stent.  Continue aspirin and Plavix. Antibiotics on Keflex, condition improving.     AKI (acute kidney injury) Hyponatremia Creatinine 1.1 on presentation down to 0.67.   Atrial fibrillation, chronic No anticoagulation, patient is on aspirin and Plavix.  Patient also has acute bleeding. On metoprolol for rate control.     Coronary artery disease of native artery of native heart with stable angina pectoris Hypertension Continue Toprol.  Added back lisinopril.     Tobacco abuse Nicotine patch.   Chronic systolic CHF (congestive heart failure) No current signs of heart failure.  Last echocardiogram back in 2021 showed an EF of 30%.  Patient currently euvolemic.   Severe protein calorie malnutrition. BMI 16.41, with significant muscle atrophy.  Start protein supplement.        Subjective:  Patient doing well, no complaint today.  Physical Exam: Vitals:   03/01/23 2322 03/02/23 0333 03/02/23 0922 03/02/23 1214  BP: (!) 108/53 (!) 115/51 137/61 (!) 141/54  Pulse: (!) 52 78 81 81  Resp: Temp: 98.4 F (36.9 C) 98 F (36.7 C) 98 F (36.7 C) 98.2 F (36.8 C)  TempSrc:      SpO2: 100% 99% 100% 99%  Weight:      Height:       General exam: Appears calm and comfortable, severely malnourished. Respiratory system: Clear to auscultation. Respiratory effort normal. Cardiovascular system: S1 & S2 heard, RRR. No JVD, murmurs, rubs, gallops or clicks. No pedal edema. Gastrointestinal system: Abdomen is nondistended, soft and nontender. No organomegaly or masses felt. Normal bowel sounds heard.  Central nervous system: Alert and oriented. No focal neurological deficits. Extremities: Symmetric 5 x 5 power. Skin: No rashes, lesions or ulcers Psychiatry: Judgement and insight appear normal. Mood & affect appropriate.     Data Reviewed:  There are no new results to review at this time.  Family Communication:  None  Disposition: Status is: Inpatient Remains inpatient appropriate because: Unsafe discharge, pending placement.     Time spent: 25 minutes  Author: Marrion Coy, MD 03/02/2023 12:37 PM  For on call review [email or web address].

## 2023-03-02 NOTE — Plan of Care (Signed)

## 2023-03-02 NOTE — Progress Notes (Addendum)
Physical Therapy Treatment Patient Details Name: [name] [name] MRN: [id number] DOB: 1935-03-23 Today's Date: 03/02/2023   History of Present Illness Pt  is a 88 y.o. female 88 year old female with history of lower extremity wounds, PAD, CAD, atrial fibrillation, congestive heart failure presents with lower extremity wounds getting worse. Patient was given doxycycline a few days ago for cellulitis but has gotten worse. In the ER she was placed on Rocephin.    PT Comments    Pt ready to walk this am.  OOB with mod I.  She is able to stand and walk x 1 lap on unit with RW and min guard x 1.  Gait remains generally unsteady with poor walker position but no LOB noted.  She does state she uses a rollator at home and prefers that instead.  To BSC to void after gait.  She does remain in chair after session but asks to only stay up x 1 hour.  Educated to call nursing staff for assist when ready.    Recommendations for follow up therapy are one component of a multi-disciplinary discharge planning process, led by the attending physician.  Recommendations may be updated based on patient status, additional functional criteria and insurance authorization.  Follow Up Recommendations       Assistance Recommended at Discharge Intermittent Supervision/Assistance  Patient can return home with the following A little help with walking and/or transfers;A little help with bathing/dressing/bathroom;Assistance with cooking/housework;Assist for transportation;Help with stairs or ramp for entrance   Equipment Recommendations       Recommendations for Other Services       Precautions / Restrictions Precautions Precautions: Fall Restrictions Weight Bearing Restrictions: No     Mobility  Bed Mobility Overal bed mobility: Modified Independent                  Transfers Overall transfer level: Needs assistance Equipment used: Rolling walker (2 wheels) Transfers: Sit to/from Stand Sit to Stand: Min  guard           General transfer comment: verbal cuing for correct technique with one hand on walker, one hand on bed.    Ambulation/Gait Ambulation/Gait assistance: Min guard Gait Distance (Feet): 160 Feet Assistive device: Rolling walker (2 wheels) Gait Pattern/deviations: Step-through pattern, Decreased step length - right, Decreased step length - left, Narrow base of support, Drifts right/left Gait velocity: decreased         Stairs             Wheelchair Mobility    Modified Rankin (Stroke Patients Only)       Balance Overall balance assessment: Needs assistance Sitting-balance support: Feet supported Sitting balance-Leahy Scale: Good     Standing balance support: Bilateral upper extremity supported, During functional activity, Reliant on assistive device for balance Standing balance-Leahy Scale: Fair                              Cognition Arousal/Alertness: Awake/alert Behavior During Therapy: WFL for tasks assessed/performed Overall Cognitive Status: Within Functional Limits for tasks assessed                                          Exercises Other Exercises Other Exercises: BSC to void    General Comments        Pertinent Vitals/Pain Pain Assessment Pain Assessment: No/denies pain  Home Living                          Prior Function            PT Goals (current goals can now be found in the care plan section) Progress towards PT goals: Progressing toward goals    Frequency    Min 2X/week      PT Plan Current plan remains appropriate    Co-evaluation              AM-PAC PT "6 Clicks" Mobility   Outcome Measure  Help needed turning from your back to your side while in a flat bed without using bedrails?: None Help needed moving from lying on your back to sitting on the side of a flat bed without using bedrails?: None Help needed moving to and from a bed to a chair (including  a wheelchair)?: A Little Help needed standing up from a chair using your arms (e.g., wheelchair or bedside chair)?: A Little Help needed to walk in hospital room?: A Little Help needed climbing 3-5 steps with a railing? : A Lot 6 Click Score: 19    End of Session Equipment Utilized During Treatment: Gait belt Activity Tolerance: Patient tolerated treatment well Patient left: in chair;with call bell/phone within reach;with chair alarm set Nurse Communication: Mobility status PT Visit Diagnosis: Muscle weakness (generalized) (M62.81);Difficulty in walking, not elsewhere classified (R26.2)     Time: 1610-9604 PT Time Calculation (min) (ACUTE ONLY): 27 min  Charges:  $Gait Training: 23-37 mins                   Danielle Dess, PTA 03/02/23, 1:19 PM

## 2023-03-02 NOTE — Progress Notes (Signed)
Mobility Specialist - Progress Note    03/02/23 1631  Mobility  Activity Ambulated with assistance in room;Stood at bedside;Transferred to/from [facility]  Level of Assistance Minimal assist, patient does 75% or more  Assistive Device Front wheel walker  Distance Ambulated (ft) 5 ft  Range of Motion/Exercises Active  Activity Response Tolerated well  Mobility Referral Yes  $Mobility charge 1 Mobility   Author responded to bed alarm upon entry. Pt STS MinA and ambulates to [facility] urgently. Pt left with needs in reach and given education to press call bell when she is done. NT also made aware of pt on BSC.   Johnathan Hausen Mobility Specialist 03/02/23, 4:36 PM

## 2023-03-02 NOTE — Progress Notes (Signed)
Mobility Specialist - Progress Note    03/02/23 1542  Mobility  Activity Ambulated with assistance in hallway;Stood at bedside;Dangled on edge of bed  Level of Assistance Standby assist, set-up cues, supervision of patient - no hands on  Assistive Device Front wheel walker  Distance Ambulated (ft) 160 ft  Range of Motion/Exercises Active  Activity Response Tolerated well  Mobility Referral Yes  $Mobility charge 1 Mobility   Pt resting in bed on RA upon entry. Pt STS and ambulates around NS for 1 lap SBA with RW. Pt returned to recliner and left with needs in reach and chair alarm activated.   Johnathan Hausen Mobility Specialist 03/02/23, 4:13 PM

## 2023-03-03 ENCOUNTER — Encounter: Payer: 59 | Admitting: Family

## 2023-03-03 DIAGNOSIS — K31819 Angiodysplasia of stomach and duodenum without bleeding: Secondary | ICD-10-CM | POA: Diagnosis not present

## 2023-03-03 DIAGNOSIS — Z79899 Other long term (current) drug therapy: Secondary | ICD-10-CM | POA: Diagnosis not present

## 2023-03-03 DIAGNOSIS — Z743 Need for continuous supervision: Secondary | ICD-10-CM | POA: Diagnosis not present

## 2023-03-03 DIAGNOSIS — Z736 Limitation of activities due to disability: Secondary | ICD-10-CM | POA: Diagnosis not present

## 2023-03-03 DIAGNOSIS — D649 Anemia, unspecified: Secondary | ICD-10-CM | POA: Diagnosis not present

## 2023-03-03 DIAGNOSIS — N179 Acute kidney failure, unspecified: Secondary | ICD-10-CM | POA: Diagnosis not present

## 2023-03-03 DIAGNOSIS — L02213 Cutaneous abscess of chest wall: Secondary | ICD-10-CM | POA: Diagnosis not present

## 2023-03-03 DIAGNOSIS — L03115 Cellulitis of right lower limb: Secondary | ICD-10-CM | POA: Diagnosis not present

## 2023-03-03 DIAGNOSIS — S81801A Unspecified open wound, right lower leg, initial encounter: Secondary | ICD-10-CM | POA: Diagnosis not present

## 2023-03-03 DIAGNOSIS — S21001A Unspecified open wound of right breast, initial encounter: Secondary | ICD-10-CM | POA: Diagnosis not present

## 2023-03-03 DIAGNOSIS — L97212 Non-pressure chronic ulcer of right calf with fat layer exposed: Secondary | ICD-10-CM | POA: Diagnosis not present

## 2023-03-03 DIAGNOSIS — E43 Unspecified severe protein-calorie malnutrition: Secondary | ICD-10-CM | POA: Diagnosis not present

## 2023-03-03 DIAGNOSIS — B9689 Other specified bacterial agents as the cause of diseases classified elsewhere: Secondary | ICD-10-CM | POA: Diagnosis not present

## 2023-03-03 DIAGNOSIS — R6889 Other general symptoms and signs: Secondary | ICD-10-CM | POA: Diagnosis not present

## 2023-03-03 DIAGNOSIS — R2681 Unsteadiness on feet: Secondary | ICD-10-CM | POA: Diagnosis not present

## 2023-03-03 DIAGNOSIS — I87311 Chronic venous hypertension (idiopathic) with ulcer of right lower extremity: Secondary | ICD-10-CM | POA: Diagnosis not present

## 2023-03-03 DIAGNOSIS — R5383 Other fatigue: Secondary | ICD-10-CM | POA: Diagnosis not present

## 2023-03-03 DIAGNOSIS — K922 Gastrointestinal hemorrhage, unspecified: Secondary | ICD-10-CM | POA: Diagnosis not present

## 2023-03-03 DIAGNOSIS — L89514 Pressure ulcer of right ankle, stage 4: Secondary | ICD-10-CM | POA: Diagnosis not present

## 2023-03-03 DIAGNOSIS — I10 Essential (primary) hypertension: Secondary | ICD-10-CM | POA: Diagnosis not present

## 2023-03-03 DIAGNOSIS — L03119 Cellulitis of unspecified part of limb: Secondary | ICD-10-CM | POA: Diagnosis not present

## 2023-03-03 DIAGNOSIS — I739 Peripheral vascular disease, unspecified: Secondary | ICD-10-CM | POA: Diagnosis not present

## 2023-03-03 DIAGNOSIS — M6259 Muscle wasting and atrophy, not elsewhere classified, multiple sites: Secondary | ICD-10-CM | POA: Diagnosis not present

## 2023-03-03 DIAGNOSIS — D62 Acute posthemorrhagic anemia: Secondary | ICD-10-CM | POA: Diagnosis not present

## 2023-03-03 DIAGNOSIS — I4891 Unspecified atrial fibrillation: Secondary | ICD-10-CM | POA: Diagnosis not present

## 2023-03-03 MED ORDER — ENSURE ENLIVE PO LIQD: 237.0000 mL | Freq: Two times a day (BID) | ORAL | 12 refills | Status: AC

## 2023-03-03 MED ORDER — OXYCODONE HCL 5 MG PO TABS: 5.0000 mg | ORAL_TABLET | Freq: Four times a day (QID) | ORAL | 0 refills | Status: DC | PRN

## 2023-03-03 MED ORDER — CYANOCOBALAMIN 1000 MCG PO TABS: 1000.0000 ug | ORAL_TABLET | Freq: Every day | ORAL | 0 refills | Status: AC

## 2023-03-03 MED ORDER — ASPIRIN 81 MG PO TBEC: 81.0000 mg | DELAYED_RELEASE_TABLET | Freq: Every day | ORAL | 0 refills | Status: AC

## 2023-03-03 NOTE — [place] FL2 (Signed)
[place] MEDICAID FL2 LEVEL OF CARE FORM     IDENTIFICATION  Patient Name: [name] [name] Birthdate: 1935/10/11 Sex: female Admission Date (Current Location): 02/20/2023  [facility] and [place] Number:  Chiropodist and Address:  [facility], [address] [place], [place] [address]      Provider Number: [id number]  Attending Physician Name and Address:  Marrion Coy, MD  Relative Name and Phone Number:  Barbarann Ehlers (Sister) [phone]    Current Level of Care: Hospital Recommended Level of Care: Skilled Nursing Facility Prior Approval Number:    Date Approved/Denied:   PASRR Number: [id number]  Discharge Plan: SNF    Current Diagnoses: Patient Active Problem List   Diagnosis Date Noted   PVD (peripheral vascular disease) 02/25/2023   Underweight 02/25/2023   Angiodysplasia of stomach 02/24/2023   Upper GI bleeding 02/21/2023   Acute blood loss anemia 02/21/2023   Toe necrosis 02/21/2023   Chronic systolic CHF (congestive heart failure) 02/21/2023   Melena 02/21/2023   Cellulitis 02/21/2023   Cellulitis of multiple sites of lower extremity 02/20/2023   AKI (acute kidney injury) 02/20/2023   Coronary artery disease of native artery of native heart with stable angina pectoris 02/05/2023   Atrial fibrillation, chronic 08/16/2021   Protein-calorie malnutrition, severe 06/13/2020   NSTEMI (non-ST elevated myocardial infarction) 06/11/2020   Hyponatremia 06/11/2020   Tobacco abuse 06/11/2020   Chronic low back pain (1ry area of Pain) (Bilateral) (R>L) w/ sciatica (Bilateral) 01/12/2020   Chronic lower extremity pain (2ry area of Pain) (Bilateral) (R>L) 01/12/2020   DDD (degenerative disc disease), lumbosacral 01/12/2020   Walker as ambulation aid 01/12/2020   Chronic pain syndrome 01/12/2020   Pain in joint, pelvic region and thigh 01/05/2020   Idiopathic scoliosis and kyphoscoliosis 12/03/2019   Hypertension 08/02/2019    Vitamin D deficiency 08/02/2019   Peripheral autonomic neuropathy 08/02/2019   Hyperlipidemia 08/02/2019    Orientation RESPIRATION BLADDER Height & Weight     Self, Time, Situation, Place  Normal Continent Weight: 45.8 kg Height:   (162.6 cm)  BEHAVIORAL SYMPTOMS/MOOD NEUROLOGICAL BOWEL NUTRITION STATUS      Continent Diet  AMBULATORY STATUS COMMUNICATION OF NEEDS Skin   Limited Assist   Other (Comment), Skin abrasions (lower extremity wounds, necrotic toe)                       Personal Care Assistance Level of Assistance  Bathing, Dressing Bathing Assistance: Limited assistance   Dressing Assistance: Limited assistance     Functional Limitations Info             SPECIAL CARE FACTORS FREQUENCY  PT (By licensed PT), OT (By licensed OT)     PT Frequency: 5 times a week OT Frequency: 5 times a week            Contractures Contractures Info: Not present    Additional Factors Info  Code Status, Allergies Code Status Info: Full Allergies Info: Spironolactone High  Anaphylaxis  Fluogen (influenza Virus Vaccine) Not Specified  Hives  Statins Not Specified   Dizziness & weakness Sulfa Antibiotics           Current Medications (03/03/2023):  This is the current hospital active medication list Current Facility-Administered Medications  Medication Dose Route Frequency Provider Last Rate Last Admin   0.9 %  sodium chloride infusion  250 mL Intravenous PRN Schnier, Latina Craver, MD       acetaminophen (TYLENOL) tablet 650 mg  650 mg Oral Q4H PRN Renford Dills, MD   650 mg at 03/01/23 2159   albuterol (PROVENTIL) (2.5 MG/3ML) 0.083% nebulizer solution 3 mL  3 mL Nebulization Q2H PRN Schnier, Latina Craver, MD       aspirin EC tablet 81 mg  81 mg Oral Daily Marrion Coy, MD   81 mg at 03/03/23 0844   cephALEXin (KEFLEX) capsule 500 mg  500 mg Oral QID Marrion Coy, MD   500 mg at 03/03/23 0846   clopidogrel (PLAVIX) tablet 75 mg  75 mg Oral Q breakfast Schnier,  Latina Craver, MD   75 mg at 03/03/23 0844   cyanocobalamin (VITAMIN B12) tablet 1,000 mcg  1,000 mcg Oral Daily Renford Dills, MD   1,000 mcg at 03/03/23 0844   feeding supplement (ENSURE ENLIVE / ENSURE PLUS) liquid 237 mL  237 mL Oral BID BM Marrion Coy, MD   237 mL at 03/03/23 1100   fentaNYL (SUBLIMAZE) injection 12.5 mcg  12.5 mcg Intravenous Once PRN Schnier, Latina Craver, MD       gabapentin (NEURONTIN) capsule 400 mg  400 mg Oral QID Renford Dills, MD   400 mg at 03/03/23 0844   hydrALAZINE (APRESOLINE) injection 5 mg  5 mg Intravenous Q4H PRN Schnier, Latina Craver, MD   5 mg at 02/26/23 0327   lisinopril (ZESTRIL) tablet 10 mg  10 mg Oral Daily Schnier, Latina Craver, MD   10 mg at 03/03/23 0844   morphine (PF) 2 MG/ML injection 2 mg  2 mg Intravenous Q1H PRN Renford Dills, MD   2 mg at 03/01/23 1709   ondansetron (ZOFRAN) injection 4 mg  4 mg Intravenous Q6H PRN Schnier, Latina Craver, MD       ondansetron [facility]) injection 4 mg  4 mg Intravenous Q6H PRN Schnier, Latina Craver, MD       oxyCODONE (Oxy IR/ROXICODONE) immediate release tablet 5-10 mg  5-10 mg Oral Q4H PRN Schnier, Latina Craver, MD   10 mg at 03/02/23 2240   pantoprazole (PROTONIX) EC tablet 40 mg  40 mg Oral BID Valentino Hue, MD   40 mg at 03/03/23 0844   sodium chloride flush (NS) 0.9 % injection 3 mL  3 mL Intravenous Q12H Schnier, Latina Craver, MD   3 mL at 03/03/23 0846   sodium chloride flush (NS) 0.9 % injection 3 mL  3 mL Intravenous Q12H Schnier, Latina Craver, MD   3 mL at 03/03/23 0844   sodium chloride flush (NS) 0.9 % injection 3 mL  3 mL Intravenous PRN Schnier, Latina Craver, MD       terbinafine (LAMISIL) tablet 125 mg  125 mg Oral Daily Schnier, Latina Craver, MD   125 mg at 03/03/23 0454     Discharge Medications: Please see discharge summary for a list of discharge medications.  Relevant Imaging Results:  Relevant Lab Results:   Additional Information SS# [id number]  Truddie Hidden, RN

## 2023-03-03 NOTE — Discharge Summary (Signed)
Physician Discharge Summary   Patient: [name] [name] MRN: [id number] DOB: 28-Jan-1935  Admit date:     02/20/2023  Discharge date: 03/03/23  Discharge Physician: Marrion Coy   PCP: Miki Kins, FNP   Recommendations at discharge:   Follow-up with PCP in 1 week. Follow-up with vascular surgery as scheduled. Check a CBC and BMP in 1 week.  Discharge Diagnoses: Principal Problem:   Upper GI bleeding Active Problems:   Acute blood loss anemia   Cellulitis of multiple sites of lower extremity   Toe necrosis   AKI (acute kidney injury)   Atrial fibrillation, chronic   Hypertension   Coronary artery disease of native artery of native heart with stable angina pectoris   Tobacco abuse   Hyponatremia   Protein-calorie malnutrition, severe   Chronic systolic CHF (congestive heart failure)   Melena   Cellulitis   Angiodysplasia of stomach   PVD (peripheral vascular disease)   Underweight  Resolved Problems:   * No resolved hospital problems. *  Hospital Course: 88 year old female with history of lower extremity wounds, PAD, CAD, atrial fibrillation, congestive heart failure presents with lower extremity wounds getting worse.  Patient was given doxycycline a few days ago for cellulitis but has gotten worse.  In the ER she was placed on Rocephin. Patient developed upper GI bleed on 4/12 with a hemoglobin dropped down to 7.3, she was placed on Protonix IV, received 1 units of PRBC, EGD was performed on 4/15, showed multiple angiodysplastic lesions, cauterized. Patient undergone right lower extremity angiogram and angioplasty with stent on 4/16, restarted aspirin Plavix post procedure.    Assessment and Plan: Upper GI bleeding secondary to AVM. Acute blood loss anemia Assumed B12 deficient anemia. Condition has improved, patient had received  red blood cell transfusion, she also received IV iron, she was started on B12 supplement at admission. She had a borderline B12 level in  2021.  She probably has a true B12 deficiency. Patient appears to be improving, aspirin Plavix restarted per recommendation from vascular surgery. Condition has stabilized.  Hemoglobin stable.   Cellulitis of multiple sites of lower extremity Toe necrosis Peripheral arterial disease. Status post angioplasty and stent.  Continue aspirin and Plavix. Antibiotic completed, follow-up with PCP and vascular surgery as scheduled.   AKI (acute kidney injury) Hyponatremia Creatinine 1.1 on presentation down to 0.67.   Atrial fibrillation, chronic No anticoagulation, patient is on aspirin and Plavix.  Patient also has acute bleeding. On metoprolol for rate control.     Coronary artery disease of native artery of native heart with stable angina pectoris Hypertension Continue Toprol.  Added back lisinopril.     Tobacco abuse Advised to quit.   Chronic systolic CHF (congestive heart failure) No current signs of heart failure.  Last echocardiogram back in 2021 showed an EF of 30%.  Patient currently euvolemic.       Consultants: Vascular, GI Procedures performed: EGD  Disposition: Skilled nursing facility Diet recommendation:  Discharge Diet Orders (From admission, onward)     Start     Ordered   03/03/23 0000  Diet - low sodium heart healthy        03/03/23 1058           Cardiac diet DISCHARGE MEDICATION: Allergies as of 03/03/2023       Reactions   Spironolactone Anaphylaxis   Fluogen [influenza Virus Vaccine] Hives   Statins    Dizziness & weakness   Sulfa Antibiotics Other (See Comments)   Unknown,  happened as a child        Medication List     STOP taking these medications    cephALEXin 500 MG capsule Commonly known as: KEFLEX   doxycycline 50 MG capsule Commonly known as: VIBRAMYCIN   oxyCODONE-acetaminophen 10-325 MG tablet Commonly known as: PERCOCET       TAKE these medications    aspirin EC 81 MG tablet Take 1 tablet (81 mg total) by  mouth daily. Swallow whole. Start taking on: March 04, 2023   clopidogrel 75 MG tablet Commonly known as: PLAVIX Take 1 tablet (75 mg total) by mouth daily with breakfast.   cyanocobalamin 1000 MCG tablet Take 1 tablet (1,000 mcg total) by mouth daily for 14 days. Start taking on: March 04, 2023   feeding supplement Liqd Take 237 mLs by mouth 2 (two) times daily between meals.   gabapentin 400 MG capsule Commonly known as: NEURONTIN Take 400 mg by mouth 4 (four) times daily.   lisinopril 5 MG tablet Commonly known as: ZESTRIL TAKE 1 TABLET (5 MG TOTAL) BY MOUTH DAILY. NEED APPOINTMENT   meloxicam 15 MG tablet Commonly known as: MOBIC TAKE 1 TABLET BY MOUTH EVERY DAY   metoprolol succinate 25 MG 24 hr tablet Commonly known as: TOPROL-XL Take 25 mg by mouth daily.   oxyCODONE 5 MG immediate release tablet Commonly known as: Oxy IR/ROXICODONE Take 1 tablet (5 mg total) by mouth every 6 (six) hours as needed for severe pain.   terbinafine 250 MG tablet Commonly known as: LAMISIL Take 1 tablet (250 mg total) by mouth daily.               Discharge Care Instructions  (From admission, onward)           Start     Ordered   03/03/23 0000  Discharge wound care:       Comments: Follow with RN . Apply Xeroform gauze to left and right lower extremity wounds Q day, then cover with ABD pads and kerlex. 2. Paint left 4th toe with betadine Q day   03/03/23 1058            Follow-up Information     Georgiana Spinner, NP Follow up in 6 week(s).   Specialty: Vascular Surgery Why: Bilateral lower extremity ultrasound with ABI's Contact information: [address] [place] [address] [phone]         Miki Kins, FNP Follow up in 1 week(s).   Specialty: Family Medicine Contact information: [address] [place] [address] [phone]                Discharge Exam: Filed Weights   02/28/23 0810 03/01/23 0525 03/03/23  0848  Weight: 45.1 kg 46.1 kg 45.8 kg   General exam: Appears calm and comfortable, appears severely malnourished. Respiratory system: Clear to auscultation. Respiratory effort normal. Cardiovascular system: S1 & S2 heard, RRR. No JVD, murmurs, rubs, gallops or clicks. No pedal edema. Gastrointestinal system: Abdomen is nondistended, soft and nontender. No organomegaly or masses felt. Normal bowel sounds heard. Central nervous system: Alert and oriented. No focal neurological deficits. Extremities: Symmetric 5 x 5 power. Skin: No rashes, lesions or ulcers Psychiatry: Judgement and insight appear normal. Mood & affect appropriate.    Condition at discharge: good  The results of significant diagnostics from this hospitalization (including imaging, microbiology, ancillary and laboratory) are listed below for reference.   Imaging Studies: PERIPHERAL VASCULAR CATHETERIZATION  Result Date:  02/25/2023 See surgical note for result.  CT TIBIA FIBULA RIGHT WO CONTRAST  Result Date: 02/21/2023 CLINICAL DATA:  Cellulitis EXAM: CT OF THE LOWER RIGHT EXTREMITY WITHOUT CONTRAST TECHNIQUE: Multidetector CT imaging of the right lower extremity was performed according to the standard protocol. RADIATION DOSE REDUCTION: This exam was performed according to the departmental dose-optimization program which includes automated exposure control, adjustment of the mA and/or kV according to patient size and/or use of iterative reconstruction technique. COMPARISON:  None Available. FINDINGS: Bones/Joint/Cartilage No acute bony abnormality. Specifically, no fracture, subluxation, or dislocation. No bone destruction to suggest osteomyelitis. Ligaments Suboptimally assessed by CT. Muscles and Tendons Grossly unremarkable. Soft tissues Edema throughout the subcutaneous soft tissues. No soft tissue gas or radiopaque foreign body. IMPRESSION: No acute bony abnormality. Edema throughout the subcutaneous soft tissues.  Electronically Signed   By: Charlett Nose M.D.   On: 02/21/2023 00:42   DG Ankle Complete Right  Result Date: 02/20/2023 CLINICAL DATA:  Open wounds EXAM: RIGHT ANKLE - COMPLETE 3+ VIEW COMPARISON:  None Available. FINDINGS: No fracture or malalignment. Wounds or ulcers at the lower leg and ankle. No soft tissue emphysema. No periostitis or osseous destructive change IMPRESSION: No acute osseous abnormality. Wounds or ulcers at the lower leg and ankle. Electronically Signed   By: Jasmine Pang M.D.   On: 02/20/2023 21:48   DG Chest 2 View  Result Date: 02/20/2023 CLINICAL DATA:  Shortness of breath.  History of CHF. EXAM: CHEST - 2 VIEW COMPARISON:  Chest radiograph 02/14/2023. FINDINGS: The heart is mildly enlarged, unchanged. The upper mediastinal contours are stable. The lungs hyperinflated with flattening of the diaphragms, unchanged. There is no focal consolidation or pulmonary edema. There is no pleural effusion or pneumothorax. Overall, aeration is unchanged since the study from 02/14/2023 There is S shaped curvature of the thoracolumbar spine with age-indeterminate compression deformity of the L1 vertebral body. IMPRESSION: 1. Stable aeration of the lungs since 02/14/2023 with no new or worsening focal airspace disease. 2. Hyperinflated lungs consistent with underlying COPD. 3. S shaped curvature of the spine with age-indeterminate compression deformity of the L1 vertebral body. Electronically Signed   By: Lesia Hausen M.D.   On: 02/20/2023 17:38   DG Ankle Complete Right  Result Date: 02/14/2023 CLINICAL DATA:  Pain and open wound in the skin in the medial aspect EXAM: RIGHT ANKLE - COMPLETE 3+ VIEW COMPARISON:  None Available. FINDINGS: No recent fracture or dislocation is seen. There is 3.9 mm low-density at the tip of medial malleolus. There is possible open wound in the skin in the medial aspect. There is soft tissue swelling along the lateral aspect. IMPRESSION: No recent fracture or  dislocation is seen. There is 3.9 mm low density at the tip of medial malleolus. This may be residual from previous injury or suggest infectious process such as osteomyelitis. If clinically warranted, follow-up MRI may be considered. Electronically Signed   By: Ernie Avena M.D.   On: 02/14/2023 17:08   DG Chest 2 View  Result Date: 02/14/2023 CLINICAL DATA:  Sepsis EXAM: CHEST - 2 VIEW COMPARISON:  X-ray 06/11/2020 FINDINGS: Hyperinflation. No consolidation or pneumothorax. Slight blunting of the posterior costophrenic angles. Tiny effusions are possible. Enlarged cardiopericardial silhouette. No edema. Calcified aorta. Curvature of the spine. Film is rotated. Lateral view is limited by motion IMPRESSION: Hyperinflation.  Mildly enlarged heart.  Trace pleural fluid Electronically Signed   By: Karen Kays M.D.   On: 02/14/2023 14:53    Microbiology:  Results for orders placed or performed during the hospital encounter of 02/20/23  MRSA Next Gen by PCR, Nasal     Status: None   Collection Time: 02/25/23  6:06 AM   Specimen: Nasal Mucosa; Nasal Swab  Result Value Ref Range Status   MRSA by PCR Next Gen NOT DETECTED NOT DETECTED Final    Comment: (NOTE) The GeneXpert MRSA Assay (FDA approved for NASAL specimens only), is one component of a comprehensive MRSA colonization surveillance program. It is not intended to diagnose MRSA infection nor to guide or monitor treatment for MRSA infections. Test performance is not FDA approved in patients less than 19 years old. Performed at [facility], [address] [place], [place] [address]     Labs: CBC: Recent Labs  Lab 02/25/23 [phone] 02/26/23 0433 02/27/23 0439 02/28/23 0438 03/01/23 0453  WBC 8.1 9.2 9.6 9.0 7.7  HGB 9.5* 9.3* 9.0* 8.3* 9.0*  HCT 31.4* 29.7* 28.8* 27.1* 30.2*  MCV 87.5 85.6 85.7 86.6 89.6  PLT 340 312 296 285 279   Basic Metabolic Panel: Recent Labs  Lab 02/25/23 0418 02/26/23 0433 02/28/23 0438   NA 134* 130* 131*  K 3.4* 3.5 4.3  CL 99 101 100  CO2 GLUCOSE 99 93 88  BUN 16 16 29*  CREATININE 0.53 0.58 0.60  CALCIUM 7.8* 7.8* 7.9*   Liver Function Tests: No results for input(s): "AST", "ALT", "ALKPHOS", "BILITOT", "PROT", "ALBUMIN" in the last 168 hours. CBG: No results for input(s): "GLUCAP" in the last 168 hours.  Discharge time spent: greater than 30 minutes.  Signed: Marrion Coy, MD [facility] 03/03/2023

## 2023-03-03 NOTE — Care Management Important Message (Signed)
Important Message  Patient Details  Name: [name] [name] MRN: [id number] Date of Birth: 1935-11-11   Medicare Important Message Given:  Yes     [name] 03/03/2023, 11:55 AM

## 2023-03-03 NOTE — TOC Transition Note (Addendum)
Transition of Care [facility]) - CM/SW Discharge Note   Patient Details  Name: [name] [name] MRN: [id number] Date of Birth: [birth year]  Transition of Care [facility]) CM/SW Contact:  Truddie Hidden, RN Phone Number: 03/03/2023, 11:11 AM   Clinical Narrative:    Retrieved a photo image from patient's sister, [name] [name] of patient's lower extremities.   Per [facility] patient approved for SNF via 4/23. MD notified. Spoke with Tammy at [facility]. Patient can admit today Attempt to reach patient's sister [name] [name] to advised of discharge today. No answer. Left a message.  Patient assigned room # 604 B Nurse will call report to [phone] Family notified. Spoke with [name] [name].  Face sheet and medical necessity forms added to the EMS pack EMS arranged  Discharge summary and SNF tranfer report sent in HUB.  TOC signing off.    Spoke with patient's sister [name] [name]. She was advised Candie Chroman will continue to follow at [facility].  Per [facility] patient receives $900 in disability. She is not aware of MCD application has been started.     Barriers to Discharge: Continued Medical Work up   Patient Goals and CMS Choice   Choice offered to / list presented to : Patient  Discharge Placement                         Discharge Plan and Services Additional resources added to the After Visit Summary for       Post Acute Care Choice: Skilled Nursing Facility                               Social Determinants of Health (SDOH) Interventions SDOH Screenings   Housing: Low Risk  (12/25/2022)  Transportation Needs: No Transportation Needs (12/25/2022)  Depression (PHQ2-9): High Risk (12/25/2022)  Physical Activity: Insufficiently Active (12/25/2022)  Social Connections: Socially Isolated (12/25/2022)  Stress: Stress Concern Present (12/25/2022)  Tobacco Use: Medium Risk (02/26/2023)     Readmission Risk Interventions     No data to display

## 2023-03-04 DIAGNOSIS — I739 Peripheral vascular disease, unspecified: Secondary | ICD-10-CM | POA: Diagnosis not present

## 2023-03-06 DIAGNOSIS — L03115 Cellulitis of right lower limb: Secondary | ICD-10-CM | POA: Diagnosis not present

## 2023-03-06 DIAGNOSIS — K922 Gastrointestinal hemorrhage, unspecified: Secondary | ICD-10-CM | POA: Diagnosis not present

## 2023-03-06 DIAGNOSIS — S21001A Unspecified open wound of right breast, initial encounter: Secondary | ICD-10-CM | POA: Diagnosis not present

## 2023-03-06 DIAGNOSIS — S81801A Unspecified open wound, right lower leg, initial encounter: Secondary | ICD-10-CM | POA: Diagnosis not present

## 2023-03-06 NOTE — Progress Notes (Deleted)
   Patient ID: [name] [name], female    DOB: 10-11-1935, 88 y.o.   MRN: [id number]  Primary cardiologist: Lorine Bears, MD (last seen 09/21) PCP: Miki Kins, FNP (last seen 03/24)  HPI  [name] [name] is a 88 y/o female with a history of  Echo 06/12/20: EF 30-35% along with mild LVH, mildly elevated PA pressure, moderate MR and mild/ moderate aortic sclerosis but no AS.   LHC 06/12/20:  A drug-eluting stent was successfully placed using a STENT RESOLUTE ONYX 2.75X15. Mid LAD-1 lesion is 99% stenosed. Post intervention, there is a 0% residual stenosis. Mid LAD-2 lesion is 30% stenosed. There is moderate to severe left ventricular systolic dysfunction. The left ventricular ejection fraction is 25-35% by visual estimate. LV end diastolic pressure is moderately elevated. Prox RCA to Mid RCA lesion is 30% stenosed. Dist RCA lesion is 30% stenosed.  1.  Severe one-vessel coronary artery disease with 99% stenosis in the mid LAD at the origin of second diagonal.  No other obstructive disease. 2.  Moderately severely reduced LV systolic function with an EF of 35% with segmental wall motion abnormalities consistent with an LAD infarct.  Moderately elevated left ventricular end-diastolic pressure 3.  Successful angioplasty and drug-eluting stent placement to the mid LAD.  Second diagonal was jailed by the stent initially with loss of flow that improved after giving intracoronary nitroglycerin.  There was TIMI-3 flow at the end.  Admitted 02/20/23 due to UGI bleed secondary to AVM and cellulitis in lower extremity. EGD was performed on 4/15, showed multiple angiodysplastic lesions, cauterized. Required RBC's and iron infusion. S/p right lower extremity angiogram with angioplasty and stent placement x 2 on 4/16. Was in the ED 02/14/23 due to cellulitis.   She presents today for her initial HF visit with a chief complaint of   Review of Systems    Physical Exam  Assessment & Plan:  1: Chronic heart  failure with reduced ejection fraction- - NYHA class - - Echo 06/12/20: EF 30-35% along with mild LVH, mildly elevated PA pressure, moderate MR and mild/ moderate aortic sclerosis but no AS.  - BNP 06/11/20 was 1621.3  2: HTN- - BP - saw PCP Talbert Forest) 03/24 - BMP 02/28/23 showed sodium 131, potassium 4.3, creatinine 0.6 & GFR >60  3: CAD- - saw cardiology Dan Humphreys) 09/21  LHC 06/12/20:  A drug-eluting stent was successfully placed using a STENT RESOLUTE ONYX 2.75X15. Mid LAD-1 lesion is 99% stenosed. Post intervention, there is a 0% residual stenosis. Mid LAD-2 lesion is 30% stenosed. There is moderate to severe left ventricular systolic dysfunction. The left ventricular ejection fraction is 25-35% by visual estimate. LV end diastolic pressure is moderately elevated. Prox RCA to Mid RCA lesion is 30% stenosed. Dist RCA lesion is 30% stenosed.  1.  Severe one-vessel coronary artery disease with 99% stenosis in the mid LAD at the origin of second diagonal.  No other obstructive disease. 2.  Moderately severely reduced LV systolic function with an EF of 35% with segmental wall motion abnormalities consistent with an LAD infarct.  Moderately elevated left ventricular end-diastolic pressure 3.  Successful angioplasty and drug-eluting stent placement to the mid LAD.  Second diagonal was jailed by the stent initially with loss of flow that improved after giving intracoronary nitroglycerin.  There was TIMI-3 flow at the end.  4: PVD- - S/p right lower extremity angiogram with angioplasty and stent placement x 2 on 02/25/23 - has ABI and vascular f/u on 04/16/23

## 2023-03-07 ENCOUNTER — Encounter: Payer: 59 | Admitting: Family

## 2023-03-07 ENCOUNTER — Telehealth: Payer: Self-pay | Admitting: Family

## 2023-03-07 NOTE — Telephone Encounter (Signed)
Patient did not show for her initial Heart Failure [facility] appointment on 03/07/23.

## 2023-03-10 DIAGNOSIS — N179 Acute kidney failure, unspecified: Secondary | ICD-10-CM | POA: Diagnosis not present

## 2023-03-10 DIAGNOSIS — K922 Gastrointestinal hemorrhage, unspecified: Secondary | ICD-10-CM | POA: Diagnosis not present

## 2023-03-10 DIAGNOSIS — L03115 Cellulitis of right lower limb: Secondary | ICD-10-CM | POA: Diagnosis not present

## 2023-03-12 ENCOUNTER — Ambulatory Visit: Payer: 59 | Admitting: Family

## 2023-03-13 DIAGNOSIS — D649 Anemia, unspecified: Secondary | ICD-10-CM | POA: Diagnosis not present

## 2023-03-13 DIAGNOSIS — K922 Gastrointestinal hemorrhage, unspecified: Secondary | ICD-10-CM | POA: Diagnosis not present

## 2023-03-13 DIAGNOSIS — I739 Peripheral vascular disease, unspecified: Secondary | ICD-10-CM | POA: Diagnosis not present

## 2023-03-13 DIAGNOSIS — L02213 Cutaneous abscess of chest wall: Secondary | ICD-10-CM | POA: Diagnosis not present

## 2023-03-13 DIAGNOSIS — N179 Acute kidney failure, unspecified: Secondary | ICD-10-CM | POA: Diagnosis not present

## 2023-03-13 DIAGNOSIS — L89514 Pressure ulcer of right ankle, stage 4: Secondary | ICD-10-CM | POA: Diagnosis not present

## 2023-03-13 DIAGNOSIS — L03115 Cellulitis of right lower limb: Secondary | ICD-10-CM | POA: Diagnosis not present

## 2023-03-13 DIAGNOSIS — L97212 Non-pressure chronic ulcer of right calf with fat layer exposed: Secondary | ICD-10-CM | POA: Diagnosis not present

## 2023-03-13 DIAGNOSIS — I4891 Unspecified atrial fibrillation: Secondary | ICD-10-CM | POA: Diagnosis not present

## 2023-03-13 DIAGNOSIS — I87311 Chronic venous hypertension (idiopathic) with ulcer of right lower extremity: Secondary | ICD-10-CM | POA: Diagnosis not present

## 2023-03-14 ENCOUNTER — Telehealth (INDEPENDENT_AMBULATORY_CARE_PROVIDER_SITE_OTHER): Payer: Self-pay | Admitting: Nurse Practitioner

## 2023-03-14 NOTE — Telephone Encounter (Signed)
Kim at [employer] [phone]) [facility] stating that she needed to make a follow up for patient. I returned the call advising that the day she got out of the hospital, we made the appt. Wednesday 6.5.24 at 9:30 am for [place] and 10:30 am  to see Sheppard Plumber, NP, per discharge instructions. Nothing further is needed at this time.

## 2023-03-17 ENCOUNTER — Other Ambulatory Visit: Payer: Self-pay

## 2023-03-17 DIAGNOSIS — I10 Essential (primary) hypertension: Secondary | ICD-10-CM

## 2023-03-17 DIAGNOSIS — I25118 Atherosclerotic heart disease of native coronary artery with other forms of angina pectoris: Secondary | ICD-10-CM

## 2023-03-17 MED ORDER — LISINOPRIL 5 MG PO TABS: 5.0000 mg | ORAL_TABLET | Freq: Every day | ORAL | 1 refills | Status: DC

## 2023-03-19 DIAGNOSIS — E569 Vitamin deficiency, unspecified: Secondary | ICD-10-CM | POA: Diagnosis not present

## 2023-03-19 DIAGNOSIS — M6259 Muscle wasting and atrophy, not elsewhere classified, multiple sites: Secondary | ICD-10-CM | POA: Diagnosis not present

## 2023-03-19 DIAGNOSIS — L03115 Cellulitis of right lower limb: Secondary | ICD-10-CM | POA: Diagnosis not present

## 2023-03-26 ENCOUNTER — Ambulatory Visit (INDEPENDENT_AMBULATORY_CARE_PROVIDER_SITE_OTHER): Payer: 59 | Admitting: Family

## 2023-03-26 ENCOUNTER — Encounter: Payer: Self-pay | Admitting: Family

## 2023-03-26 VITALS — HR 99 | Ht 68.0 in | Wt 97.6 lb

## 2023-03-26 DIAGNOSIS — R4182 Altered mental status, unspecified: Secondary | ICD-10-CM | POA: Diagnosis not present

## 2023-03-26 DIAGNOSIS — L03115 Cellulitis of right lower limb: Secondary | ICD-10-CM | POA: Diagnosis not present

## 2023-03-26 DIAGNOSIS — G894 Chronic pain syndrome: Secondary | ICD-10-CM | POA: Diagnosis not present

## 2023-03-26 DIAGNOSIS — S81802D Unspecified open wound, left lower leg, subsequent encounter: Secondary | ICD-10-CM

## 2023-03-26 DIAGNOSIS — M6259 Muscle wasting and atrophy, not elsewhere classified, multiple sites: Secondary | ICD-10-CM | POA: Diagnosis not present

## 2023-03-26 DIAGNOSIS — S81801D Unspecified open wound, right lower leg, subsequent encounter: Secondary | ICD-10-CM

## 2023-03-26 DIAGNOSIS — M4120 Other idiopathic scoliosis, site unspecified: Secondary | ICD-10-CM | POA: Diagnosis not present

## 2023-03-26 DIAGNOSIS — E569 Vitamin deficiency, unspecified: Secondary | ICD-10-CM | POA: Diagnosis not present

## 2023-03-28 ENCOUNTER — Encounter: Payer: Self-pay | Admitting: Family

## 2023-03-28 DIAGNOSIS — S81802A Unspecified open wound, left lower leg, initial encounter: Secondary | ICD-10-CM | POA: Insufficient documentation

## 2023-03-28 DIAGNOSIS — S81801D Unspecified open wound, right lower leg, subsequent encounter: Secondary | ICD-10-CM | POA: Insufficient documentation

## 2023-03-28 NOTE — Progress Notes (Signed)
Established Patient Office Visit  Subjective:  Patient ID: [name] [name], female    DOB: 29-Jun-1935  Age: 88 y.o. MRN: [id number]  Chief Complaint  Patient presents with   Follow-up    Follow up, memory problems.    Patient here after recent hospitalization and stay in a rehab facility.  She is doing slightly better with her movement, but says that she is still having significant issues.  She has home health, but she needs them to help with her physical therapy and her legs. They need orders.   Her sister is with her, and she left a letter with me yesterday to make me aware of several issues:  She has been having issues with her remembering things, says that she is forgetting to make appointments, etc.  She also says that she is having trouble finding her words, and that she will often answer the phone, and think people are speaking different languages, but she is speaking gibberish.   No other concerns at this time.   Past Medical History:  Diagnosis Date   Atrial fibrillation (HCC)    Basal cell carcinoma 2021   L temple   Chronic back pain    Congestive heart failure (CHF) (HCC)    History of total hip replacement (Bilateral) 01/12/2020   Left hip replacement done in 2019.  Right hip replacement done in 2012.   Hypertension    Hyponatremia 06/11/2020   NSTEMI (non-ST elevated myocardial infarction) (HCC) 06/11/2020   Pharmacologic therapy 01/12/2020   Problems influencing health status 01/12/2020   Scoliosis     Past Surgical History:  Procedure Laterality Date   ABDOMINAL SURGERY     bilateral hip replacement     CORONARY STENT INTERVENTION N/A 06/12/2020   Procedure: CORONARY STENT INTERVENTION;  Surgeon: Iran Ouch, MD;  Location: [facility] INVASIVE CV LAB;  Service: Cardiovascular;  Laterality: N/A;  LAD   ESOPHAGOGASTRODUODENOSCOPY (EGD) WITH PROPOFOL N/A 02/24/2023   Procedure: ESOPHAGOGASTRODUODENOSCOPY (EGD) WITH PROPOFOL;  Surgeon: Midge Minium, MD;   Location: [facility] ENDOSCOPY;  Service: Endoscopy;  Laterality: N/A;   LEFT HEART CATH AND CORONARY ANGIOGRAPHY N/A 06/12/2020   Procedure: LEFT HEART CATH AND CORONARY ANGIOGRAPHY;  Surgeon: Iran Ouch, MD;  Location: [facility] INVASIVE CV LAB;  Service: Cardiovascular;  Laterality: N/A;   LOWER EXTREMITY ANGIOGRAPHY Right 02/25/2023   Procedure: Lower Extremity Angiography;  Surgeon: Renford Dills, MD;  Location: [facility] INVASIVE CV LAB;  Service: Cardiovascular;  Laterality: Right;   TONSILLECTOMY      Social History   Socioeconomic History   Marital status: Single    Spouse name: Not on file   Number of children: 0   Years of education: Not on file   Highest education level: Not on file  Occupational History   Not on file  Tobacco Use   Smoking status: Former    Packs/day: 0.50    Years: 68.00    Additional pack years: 0.00    Total pack years: 34.00    Types: Cigarettes    Quit date: 06/11/2020    Years since quitting: 2.7   Smokeless tobacco: Never  Substance and Sexual Activity   Alcohol use: Not Currently   Drug use: Not Currently   Sexual activity: Not on file  Other Topics Concern   Not on file  Social History Narrative   Not on file   Social Determinants of Health   Financial Resource Strain: Not on file  Food Insecurity: Not on file  Transportation Needs: No Transportation Needs (12/25/2022)   PRAPARE - Administrator, Civil Service (Medical): No    Lack of Transportation (Non-Medical): No  Physical Activity: Insufficiently Active (12/25/2022)   Exercise Vital Sign    Days of Exercise per Week: 7 days    Minutes of Exercise per Session: 20 min  Stress: Stress Concern Present (12/25/2022)   [employer] of Occupational Health - Occupational Stress Questionnaire    Feeling of Stress : Very much  Social Connections: Socially Isolated (12/25/2022)   Social Connection and Isolation Panel [NHANES]    Frequency of Communication with Friends and  Family: Never    Frequency of Social Gatherings with Friends and Family: Never    Attends Religious Services: Never    Database administrator or Organizations: No    Attends Banker Meetings: Never    Marital Status: Never married  Intimate Partner Violence: Not At Risk (12/25/2022)   Humiliation, Afraid, Rape, and Kick questionnaire    Fear of Current or Ex-Partner: No    Emotionally Abused: No    Physically Abused: No    Sexually Abused: No    Family History  Problem Relation Age of Onset   CVA Father    Heart disease Father    Arthritis/Rheumatoid Sister     Allergies  Allergen Reactions   Spironolactone Anaphylaxis   Fluogen [Influenza Virus Vaccine] Hives   Statins     Dizziness & weakness   Sulfa Antibiotics Other (See Comments)    Unknown, happened as a child    Review of Systems  HENT:  Positive for hearing loss.   Musculoskeletal:  Positive for back pain and joint pain.  Skin:        Wounds on bilateral lower legs.   Psychiatric/Behavioral:  Positive for memory loss.   All other systems reviewed and are negative.      Objective:   Pulse 99   Ht 5\' 8"  (1.727 m)   Wt 97 lb 9.6 oz (44.3 kg)   SpO2 98%   BMI 14.84 kg/m   Vitals:   03/26/23 1026  Pulse: 99  Height: 5\' 8"  (1.727 m)  Weight: 97 lb 9.6 oz (44.3 kg)  SpO2: 98%  BMI (Calculated): 14.84    Physical Exam Vitals reviewed.  Constitutional:      Appearance: Normal appearance. She is normal weight.  Skin:    Findings: Ecchymosis and wound present.     Comments: Wounds on bilateral LE.   Neurological:     Mental Status: She is alert.  Psychiatric:        Mood and Affect: Mood is anxious. Affect is flat.        Speech: Speech normal.        Behavior: Behavior normal. Behavior is cooperative.        Cognition and Memory: Cognition normal. Memory is impaired.      No results found for any visits on 03/26/23.  Recent Results (from the past 2160 hour(s))  Comprehensive  metabolic panel     Status: Abnormal   Collection Time: 02/14/23  2:11 PM  Result Value Ref Range   Sodium 130 (L) 135 - 145 mmol/L   Potassium 4.6 3.5 - 5.1 mmol/L   Chloride 95 (L) 98 - 111 mmol/L   CO2 25 22 - 32 mmol/L   Glucose, Bld 132 (H) 70 - 99 mg/dL    Comment: Glucose reference range applies only to samples taken after fasting  for at least 8 hours.   BUN 15 8 - 23 mg/dL   Creatinine, Ser 1.61 0.44 - 1.00 mg/dL   Calcium 8.5 (L) 8.9 - 10.3 mg/dL   Total Protein 7.0 6.5 - 8.1 g/dL   Albumin 3.0 (L) 3.5 - 5.0 g/dL   AST 31 15 - 41 U/L   ALT 23 0 - 44 U/L   Alkaline Phosphatase 118 38 - 126 U/L   Total Bilirubin 0.6 0.3 - 1.2 mg/dL   GFR, Estimated >09 >60 mL/min    Comment: (NOTE) Calculated using the CKD-EPI Creatinine Equation (2021)    Anion gap 10 5 - 15    Comment: Performed at [facility], [address] [place], [place] [address]  Lactic acid, plasma     Status: None   Collection Time: 02/14/23  2:11 PM  Result Value Ref Range   Lactic Acid, Venous 1.8 0.5 - 1.9 mmol/L    Comment: Performed at [facility], [address] [place], [place] [address]  CBC with Differential     Status: Abnormal   Collection Time: 02/14/23  2:11 PM  Result Value Ref Range   WBC 8.6 4.0 - 10.5 K/uL   RBC 3.12 (L) 3.87 - 5.11 MIL/uL   Hemoglobin 8.4 (L) 12.0 - 15.0 g/dL   HCT 47.8 (L) 29.5 - 62.1 %   MCV 89.7 80.0 - 100.0 fL   MCH 26.9 26.0 - 34.0 pg   MCHC 30.0 30.0 - 36.0 g/dL   RDW 30.8 65.7 - 84.6 %   Platelets 460 (H) 150 - 400 K/uL   nRBC 0.0 0.0 - 0.2 %   Neutrophils Relative % 85 %   Neutro Abs 7.3 1.7 - 7.7 K/uL   Lymphocytes Relative 6 %   Lymphs Abs 0.5 (L) 0.7 - 4.0 K/uL   Monocytes Relative 9 %   Monocytes Absolute 0.7 0.1 - 1.0 K/uL   Eosinophils Relative 0 %   Eosinophils Absolute 0.0 0.0 - 0.5 K/uL   Basophils Relative 0 %   Basophils Absolute 0.0 0.0 - 0.1 K/uL   Immature Granulocytes 0 %   Abs Immature Granulocytes 0.03 0.00 - 0.07  K/uL    Comment: Performed at [facility], [address] [place], [place] [address]  Protime-INR     Status: None   Collection Time: 02/14/23  2:11 PM  Result Value Ref Range   Prothrombin Time 13.9 11.4 - 15.2 seconds   INR 1.1 0.8 - 1.2    Comment: (NOTE) INR goal varies based on device and disease states. Performed at [facility], [address] [place], [place] [address]   Culture, blood (Routine x 2)     Status: None   Collection Time: 02/14/23  2:11 PM   Specimen: BLOOD  Result Value Ref Range   Specimen Description BLOOD LEFT AC    Special Requests      BOTTLES DRAWN AEROBIC AND ANAEROBIC Blood Culture adequate volume   Culture      NO GROWTH 5 DAYS Performed at [facility], [address], [place], [place] [address]    Report Status 02/19/2023 FINAL   Basic metabolic panel     Status: Abnormal   Collection Time: 02/20/23  5:00 PM  Result Value Ref Range   Sodium 125 (L) 135 - 145 mmol/L   Potassium 4.4 3.5 - 5.1 mmol/L   Chloride 92 (L) 98 - 111 mmol/L   CO2 22 22 - 32  mmol/L   Glucose, Bld 125 (H) 70 - 99 mg/dL    Comment: Glucose reference range applies only to samples taken after fasting for at least 8 hours.   BUN 43 (H) 8 - 23 mg/dL   Creatinine, Ser 4.09 (H) 0.44 - 1.00 mg/dL   Calcium 8.3 (L) 8.9 - 10.3 mg/dL   GFR, Estimated 49 (L) >60 mL/min    Comment: (NOTE) Calculated using the CKD-EPI Creatinine Equation (2021)    Anion gap 11 5 - 15    Comment: Performed at [facility], [address] [place], [place] [address]  CBC     Status: Abnormal   Collection Time: 02/20/23  5:00 PM  Result Value Ref Range   WBC 9.1 4.0 - 10.5 K/uL   RBC 3.21 (L) 3.87 - 5.11 MIL/uL   Hemoglobin 8.5 (L) 12.0 - 15.0 g/dL   HCT 47.8 (L) 29.5 - 62.1 %   MCV 84.7 80.0 - 100.0 fL   MCH 26.5 26.0 - 34.0 pg   MCHC 31.3 30.0 - 36.0 g/dL   RDW 30.8 65.7 - 84.6 %   Platelets 608 (H) 150 - 400 K/uL   nRBC 0.0 0.0 - 0.2 %     Comment: Performed at [facility], [address], [place], [place] [address]  Troponin I (High Sensitivity)     Status: None   Collection Time: 02/20/23  5:00 PM  Result Value Ref Range   Troponin I (High Sensitivity) 12 <18 ng/L    Comment: (NOTE) Elevated high sensitivity troponin I (hsTnI) values and significant  changes across serial measurements may suggest ACS but many other  chronic and acute conditions are known to elevate hsTnI results.  Refer to the "Links" section for chest pain algorithms and additional  guidance. Performed at [facility], [address] [place], [place] [address]   Troponin I (High Sensitivity)     Status: None   Collection Time: 02/21/23  4:12 AM  Result Value Ref Range   Troponin I (High Sensitivity) 16 <18 ng/L    Comment: (NOTE) Elevated high sensitivity troponin I (hsTnI) values and significant  changes across serial measurements may suggest ACS but many other  chronic and acute conditions are known to elevate hsTnI results.  Refer to the "Links" section for chest pain algorithms and additional  guidance. Performed at [facility], [address] [place], [place] [address]   Comprehensive metabolic panel     Status: Abnormal   Collection Time: 02/21/23  4:12 AM  Result Value Ref Range   Sodium 130 (L) 135 - 145 mmol/L   Potassium 4.4 3.5 - 5.1 mmol/L   Chloride 97 (L) 98 - 111 mmol/L   CO2 25 22 - 32 mmol/L   Glucose, Bld 100 (H) 70 - 99 mg/dL    Comment: Glucose reference range applies only to samples taken after fasting for at least 8 hours.   BUN 40 (H) 8 - 23 mg/dL   Creatinine, Ser 0.27 (H) 0.44 - 1.00 mg/dL   Calcium 7.9 (L) 8.9 - 10.3 mg/dL   Total Protein 5.7 (L) 6.5 - 8.1 g/dL   Albumin 2.3 (L) 3.5 - 5.0 g/dL   AST 25 15 - 41 U/L   ALT 27 0 - 44 U/L   Alkaline Phosphatase 117 38 - 126 U/L   Total Bilirubin 0.7 0.3 - 1.2 mg/dL   GFR, Estimated 54 (L) >60 mL/min    Comment: (NOTE) Calculated  using  the CKD-EPI Creatinine Equation (2021)    Anion gap 8 5 - 15    Comment: Performed at [facility], [address] [place], [place] [address]  CBC     Status: Abnormal   Collection Time: 02/21/23  4:12 AM  Result Value Ref Range   WBC 11.6 (H) 4.0 - 10.5 K/uL   RBC 2.80 (L) 3.87 - 5.11 MIL/uL   Hemoglobin 7.3 (L) 12.0 - 15.0 g/dL   HCT 60.4 (L) 54.0 - 98.1 %   MCV 84.6 80.0 - 100.0 fL   MCH 26.1 26.0 - 34.0 pg   MCHC 30.8 30.0 - 36.0 g/dL   RDW 19.1 47.8 - 29.5 %   Platelets 459 (H) 150 - 400 K/uL   nRBC 0.0 0.0 - 0.2 %    Comment: Performed at [facility], [address] [place], [place] [address]  Type and screen     Status: None   Collection Time: 02/21/23  4:12 AM  Result Value Ref Range   ABO/RH(D) O POS    Antibody Screen NEG    Sample Expiration 02/24/2023,2359    Unit Number [id number]    Blood Component Type RED CELLS,LR    Unit division 00    Status of Unit ISSUED,FINAL    Transfusion Status OK TO TRANSFUSE    Crossmatch Result      Compatible Performed at [facility], [address] [place], [place] [address]   BPAM RBC     Status: None   Collection Time: 02/21/23  4:12 AM  Result Value Ref Range   ISSUE DATE / TIME [id number]    Blood Product Unit Number [id number]    PRODUCT CODE [id number]    Unit Type and Rh 5100    Blood Product Expiration Date [id number]   Prepare RBC (crossmatch)     Status: None   Collection Time: 02/21/23 11:07 AM  Result Value Ref Range   Order Confirmation      ORDER PROCESSED BY BLOOD BANK Performed at [facility], [address] [place], [place] [address]   ABO/Rh     Status: None   Collection Time: 02/21/23 11:47 AM  Result Value Ref Range   ABO/RH(D)      O POS Performed at [facility], [address] [place], [place] [address]   CBC     Status: Abnormal   Collection Time: 02/22/23  5:36 AM  Result Value Ref Range   WBC 8.9 4.0 - 10.5 K/uL    RBC 3.60 (L) 3.87 - 5.11 MIL/uL   Hemoglobin 9.7 (L) 12.0 - 15.0 g/dL    Comment: REPEATED TO VERIFY   HCT 30.0 (L) 36.0 - 46.0 %   MCV 83.3 80.0 - 100.0 fL   MCH 26.9 26.0 - 34.0 pg   MCHC 32.3 30.0 - 36.0 g/dL   RDW 55.7 32.2 - 02.5 %   Platelets 428 (H) 150 - 400 K/uL   nRBC 0.0 0.0 - 0.2 %    Comment: Performed at [facility], [address] [place], [place] [address]  Basic metabolic panel     Status: Abnormal   Collection Time: 02/22/23  5:36 AM  Result Value Ref Range   Sodium 134 (L) 135 - 145 mmol/L   Potassium 3.4 (L) 3.5 - 5.1 mmol/L   Chloride 100 98 - 111 mmol/L   CO2 25 22 - 32 mmol/L   Glucose, Bld 100 (H) 70 - 99 mg/dL  Comment: Glucose reference range applies only to samples taken after fasting for at least 8 hours.   BUN 28 (H) 8 - 23 mg/dL   Creatinine, Ser 2.13 0.44 - 1.00 mg/dL   Calcium 8.0 (L) 8.9 - 10.3 mg/dL   GFR, Estimated >08 >65 mL/min    Comment: (NOTE) Calculated using the CKD-EPI Creatinine Equation (2021)    Anion gap 9 5 - 15    Comment: Performed at [facility], [address] [place], [place] [address]  Hemoglobin     Status: Abnormal   Collection Time: 02/23/23  7:40 AM  Result Value Ref Range   Hemoglobin 9.3 (L) 12.0 - 15.0 g/dL    Comment: Performed at [facility], [address] [place], [place] [address]  Basic metabolic panel     Status: Abnormal   Collection Time: 02/23/23  7:40 AM  Result Value Ref Range   Sodium 132 (L) 135 - 145 mmol/L   Potassium 3.5 3.5 - 5.1 mmol/L   Chloride 101 98 - 111 mmol/L   CO2 24 22 - 32 mmol/L   Glucose, Bld 138 (H) 70 - 99 mg/dL    Comment: Glucose reference range applies only to samples taken after fasting for at least 8 hours.   BUN 17 8 - 23 mg/dL   Creatinine, Ser 8.41 0.44 - 1.00 mg/dL   Calcium 8.0 (L) 8.9 - 10.3 mg/dL   GFR, Estimated >32 >44 mL/min    Comment: (NOTE) Calculated using the CKD-EPI Creatinine Equation (2021)    Anion gap 7 5 - 15     Comment: Performed at [facility], [address] [place], [place] [address]  Magnesium     Status: None   Collection Time: 02/23/23  7:40 AM  Result Value Ref Range   Magnesium 1.8 1.7 - 2.4 mg/dL    Comment: Performed at [facility], [address] [place], [place] [address]  Hemoglobin     Status: Abnormal   Collection Time: 02/24/23  8:34 AM  Result Value Ref Range   Hemoglobin 9.5 (L) 12.0 - 15.0 g/dL    Comment: Performed at [facility], [address] [place], [place] [address]  CBC     Status: Abnormal   Collection Time: 02/25/23  4:18 AM  Result Value Ref Range   WBC 8.1 4.0 - 10.5 K/uL   RBC 3.59 (L) 3.87 - 5.11 MIL/uL   Hemoglobin 9.5 (L) 12.0 - 15.0 g/dL   HCT 74.2 (L) 59.5 - 63.8 %   MCV 87.5 80.0 - 100.0 fL   MCH 26.5 26.0 - 34.0 pg   MCHC 30.3 30.0 - 36.0 g/dL   RDW 75.6 43.3 - 29.5 %   Platelets 340 150 - 400 K/uL   nRBC 0.0 0.0 - 0.2 %    Comment: Performed at [facility], [address] [place], [place] [address]  Basic metabolic panel     Status: Abnormal   Collection Time: 02/25/23  4:18 AM  Result Value Ref Range   Sodium 134 (L) 135 - 145 mmol/L   Potassium 3.4 (L) 3.5 - 5.1 mmol/L   Chloride 99 98 - 111 mmol/L   CO2 26 22 - 32 mmol/L   Glucose, Bld 99 70 - 99 mg/dL    Comment: Glucose reference range applies only to samples taken after fasting for at least 8 hours.   BUN 16 8 - 23 mg/dL   Creatinine, Ser 6.60 0.44 -  1.00 mg/dL   Calcium 7.8 (L) 8.9 - 10.3 mg/dL   GFR, Estimated >16 >10 mL/min    Comment: (NOTE) Calculated using the CKD-EPI Creatinine Equation (2021)    Anion gap 9 5 - 15    Comment: Performed at [facility], [address] [place], [place] [address]  MRSA Next Gen by PCR, Nasal     Status: None   Collection Time: 02/25/23  6:06 AM   Specimen: Nasal Mucosa; Nasal Swab  Result Value Ref Range   MRSA by PCR Next Gen NOT DETECTED NOT DETECTED    Comment: (NOTE) The  GeneXpert MRSA Assay (FDA approved for NASAL specimens only), is one component of a comprehensive MRSA colonization surveillance program. It is not intended to diagnose MRSA infection nor to guide or monitor treatment for MRSA infections. Test performance is not FDA approved in patients less than 11 years old. Performed at [facility], [address] [place], [place] [address]   CBC     Status: Abnormal   Collection Time: 02/26/23  4:33 AM  Result Value Ref Range   WBC 9.2 4.0 - 10.5 K/uL   RBC 3.47 (L) 3.87 - 5.11 MIL/uL   Hemoglobin 9.3 (L) 12.0 - 15.0 g/dL   HCT 19.1 (L) 47.8 - 29.5 %   MCV 85.6 80.0 - 100.0 fL   MCH 26.8 26.0 - 34.0 pg   MCHC 31.3 30.0 - 36.0 g/dL   RDW 62.1 30.8 - 65.7 %   Platelets 312 150 - 400 K/uL   nRBC 0.0 0.0 - 0.2 %    Comment: Performed at [facility], [address], [place], [place] [address]  Basic metabolic panel     Status: Abnormal   Collection Time: 02/26/23  4:33 AM  Result Value Ref Range   Sodium 130 (L) 135 - 145 mmol/L   Potassium 3.5 3.5 - 5.1 mmol/L   Chloride 101 98 - 111 mmol/L   CO2 24 22 - 32 mmol/L   Glucose, Bld 93 70 - 99 mg/dL    Comment: Glucose reference range applies only to samples taken after fasting for at least 8 hours.   BUN 16 8 - 23 mg/dL   Creatinine, Ser 2.95 0.44 - 1.00 mg/dL   Calcium 7.8 (L) 8.9 - 10.3 mg/dL   GFR, Estimated >28 >41 mL/min    Comment: (NOTE) Calculated using the CKD-EPI Creatinine Equation (2021)    Anion gap 5 5 - 15    Comment: Performed at [facility], [address] [place], [place] [address]  Ferritin     Status: None   Collection Time: 02/26/23  4:33 AM  Result Value Ref Range   Ferritin 287 11 - 307 ng/mL    Comment: Performed at [facility], [address] [place], [place] [address]  CBC     Status: Abnormal   Collection Time: 02/27/23  4:39 AM  Result Value Ref Range   WBC 9.6 4.0 - 10.5 K/uL   RBC 3.36 (L) 3.87 - 5.11 MIL/uL    Hemoglobin 9.0 (L) 12.0 - 15.0 g/dL   HCT 53.6 (L) 64.4 - 03.4 %   MCV 85.7 80.0 - 100.0 fL   MCH 26.8 26.0 - 34.0 pg   MCHC 31.3 30.0 - 36.0 g/dL   RDW 74.2 59.5 - 63.8 %   Platelets 296 150 - 400 K/uL   nRBC 0.0 0.0 - 0.2 %    Comment: Performed at [facility], [address] [place], [place] [address]  Basic metabolic panel     Status: Abnormal   Collection Time: 02/28/23  4:38 AM  Result Value Ref Range   Sodium 131 (L) 135 - 145 mmol/L   Potassium 4.3 3.5 - 5.1 mmol/L   Chloride 100 98 - 111 mmol/L   CO2 25 22 - 32 mmol/L   Glucose, Bld 88 70 - 99 mg/dL    Comment: Glucose reference range applies only to samples taken after fasting for at least 8 hours.   BUN 29 (H) 8 - 23 mg/dL   Creatinine, Ser 1.91 0.44 - 1.00 mg/dL   Calcium 7.9 (L) 8.9 - 10.3 mg/dL   GFR, Estimated >47 >82 mL/min    Comment: (NOTE) Calculated using the CKD-EPI Creatinine Equation (2021)    Anion gap 6 5 - 15    Comment: Performed at [facility], [address] [place], [place] [address]  CBC     Status: Abnormal   Collection Time: 02/28/23  4:38 AM  Result Value Ref Range   WBC 9.0 4.0 - 10.5 K/uL   RBC 3.13 (L) 3.87 - 5.11 MIL/uL   Hemoglobin 8.3 (L) 12.0 - 15.0 g/dL   HCT 30.8 (L) 65.7 - 84.6 %   MCV 86.6 80.0 - 100.0 fL   MCH 26.5 26.0 - 34.0 pg   MCHC 30.6 30.0 - 36.0 g/dL   RDW 96.2 95.2 - 84.1 %   Platelets 285 150 - 400 K/uL   nRBC 0.0 0.0 - 0.2 %    Comment: Performed at [facility], [address] [place], [place] [address]  CBC     Status: Abnormal   Collection Time: 03/01/23  4:53 AM  Result Value Ref Range   WBC 7.7 4.0 - 10.5 K/uL   RBC 3.37 (L) 3.87 - 5.11 MIL/uL   Hemoglobin 9.0 (L) 12.0 - 15.0 g/dL   HCT 10.2 (L) 72.5 - 36.6 %   MCV 89.6 80.0 - 100.0 fL   MCH 26.7 26.0 - 34.0 pg   MCHC 29.8 (L) 30.0 - 36.0 g/dL   RDW 44.0 34.7 - 42.5 %   Platelets 279 150 - 400 K/uL   nRBC 0.0 0.0 - 0.2 %    Comment: Performed at [facility],  [address], [place], [place] [address]       Assessment & Plan:   Problem List Items Addressed This Visit       Active Problems   Idiopathic scoliosis and kyphoscoliosis   Chronic pain syndrome (Chronic)   Relevant Medications   meloxicam (MOBIC) 15 MG tablet   Open wound of left lower leg   Open wound of lower leg, right, subsequent encounter   Other Visit Diagnoses     Altered mental status, unspecified altered mental status type    -  Primary     Will contact home health to see what is going on with her orders.  F/U in 2 weeks, to check on her [facility] and see what else we need to set up.   Return in about 2 weeks (around 04/09/2023) for F/U.   Total time spent: 30 minutes  Miki Kins, FNP  03/26/2023   This document may have been prepared by [facility] Voice Recognition software and as such may include unintentional dictation errors.

## 2023-04-01 DIAGNOSIS — M6259 Muscle wasting and atrophy, not elsewhere classified, multiple sites: Secondary | ICD-10-CM | POA: Diagnosis not present

## 2023-04-01 DIAGNOSIS — L03115 Cellulitis of right lower limb: Secondary | ICD-10-CM | POA: Diagnosis not present

## 2023-04-01 DIAGNOSIS — E569 Vitamin deficiency, unspecified: Secondary | ICD-10-CM | POA: Diagnosis not present

## 2023-04-03 DIAGNOSIS — L03115 Cellulitis of right lower limb: Secondary | ICD-10-CM | POA: Diagnosis not present

## 2023-04-03 DIAGNOSIS — M6259 Muscle wasting and atrophy, not elsewhere classified, multiple sites: Secondary | ICD-10-CM | POA: Diagnosis not present

## 2023-04-03 DIAGNOSIS — E569 Vitamin deficiency, unspecified: Secondary | ICD-10-CM | POA: Diagnosis not present

## 2023-04-07 DIAGNOSIS — M6259 Muscle wasting and atrophy, not elsewhere classified, multiple sites: Secondary | ICD-10-CM | POA: Diagnosis not present

## 2023-04-07 DIAGNOSIS — L03115 Cellulitis of right lower limb: Secondary | ICD-10-CM | POA: Diagnosis not present

## 2023-04-07 DIAGNOSIS — E569 Vitamin deficiency, unspecified: Secondary | ICD-10-CM | POA: Diagnosis not present

## 2023-04-08 ENCOUNTER — Ambulatory Visit (INDEPENDENT_AMBULATORY_CARE_PROVIDER_SITE_OTHER): Payer: 59

## 2023-04-08 ENCOUNTER — Ambulatory Visit (INDEPENDENT_AMBULATORY_CARE_PROVIDER_SITE_OTHER): Payer: 59 | Admitting: Podiatry

## 2023-04-08 DIAGNOSIS — B351 Tinea unguium: Secondary | ICD-10-CM

## 2023-04-08 DIAGNOSIS — M778 Other enthesopathies, not elsewhere classified: Secondary | ICD-10-CM

## 2023-04-08 DIAGNOSIS — M79674 Pain in right toe(s): Secondary | ICD-10-CM | POA: Diagnosis not present

## 2023-04-08 DIAGNOSIS — M79672 Pain in left foot: Secondary | ICD-10-CM

## 2023-04-08 DIAGNOSIS — M79675 Pain in left toe(s): Secondary | ICD-10-CM | POA: Diagnosis not present

## 2023-04-08 DIAGNOSIS — I739 Peripheral vascular disease, unspecified: Secondary | ICD-10-CM

## 2023-04-08 NOTE — Progress Notes (Signed)
   Chief Complaint  Patient presents with   Foot Ulcer    Patient came in today for left toe ulcer 4th toe, started a year ago, patient denies any pain, swelling and blood,  nail fungus     SUBJECTIVE Patient presents to office today complaining of elongated, thickened nails that cause pain while ambulating in shoes.  Patient is unable to trim their own nails.  Patient also has a history of peripheral vascular disease with ulcers to the bilateral lower extremities.  Currently they have a home health nurse coming to the house changing the dressings 2 times per week.  In the past she has gone to the [facility] wound care center.  Being followed by vascular.  Patient is here for further evaluation and treatment.  Past Medical History:  Diagnosis Date   Atrial fibrillation (HCC)    Basal cell carcinoma 2021   L temple   Chronic back pain    Congestive heart failure (CHF) (HCC)    History of total hip replacement (Bilateral) 01/12/2020   Left hip replacement done in 2019.  Right hip replacement done in 2012.   Hypertension    Hyponatremia 06/11/2020   NSTEMI (non-ST elevated myocardial infarction) (HCC) 06/11/2020   Pharmacologic therapy 01/12/2020   Problems influencing health status 01/12/2020   Scoliosis     Allergies  Allergen Reactions   Spironolactone Anaphylaxis   Fluogen [Influenza Virus Vaccine] Hives   Statins     Dizziness & weakness   Sulfa Antibiotics Other (See Comments)    Unknown, happened as a child     OBJECTIVE General Patient is awake, alert, and oriented x 3 and in no acute distress. Derm ulcers noted bilateral lower extremities with superficial skin breakdown.  Serous drainage noted.  Remaining integument unremarkable. Nails are tender, long, thickened and dystrophic with subungual debris, consistent with onychomycosis, 1-5 bilateral. Vasc followed by vascular.  Lower extremity angiography 02/25/2023.  Dr. Gilda Crease [place] vascular vein specialist. Neuro light  touch and protective threshold sensation grossly intact bilaterally.  Musculoskeletal Exam No symptomatic pedal deformities noted bilateral.  No prior amputations  ASSESSMENT 1.  Pain due to onychomycosis of toenails both 2.  Peripheral vascular disease with ulcers bilateral lower extremities  PLAN OF CARE 1. Patient evaluated today.  Dressings reapplied to the right lower extremity 2.  Patient has home nursing care changing the dressings 2x/week.  Also has follow-up appointment with [place] [facility] 04/16/2023. Will defer wound management to the ordering physician of the dressing changes.  If wounds continue to progress recommend returning to the wound care center 3. Mechanical debridement of nails 1-5 bilaterally performed using a nail nipper. Filed with dremel without incident.  4. Return to clinic in 3 mos. for routine footcare   Felecia Shelling, DPM [facility]  Dr. Felecia Shelling, DPM    [address] [place], [place] [address]                Office [phone]  Fax [phone]

## 2023-04-09 ENCOUNTER — Encounter (INDEPENDENT_AMBULATORY_CARE_PROVIDER_SITE_OTHER): Payer: Self-pay | Admitting: Vascular Surgery

## 2023-04-09 ENCOUNTER — Ambulatory Visit (INDEPENDENT_AMBULATORY_CARE_PROVIDER_SITE_OTHER): Payer: 59 | Admitting: Family

## 2023-04-09 ENCOUNTER — Encounter: Payer: Self-pay | Admitting: Family

## 2023-04-09 VITALS — BP 128/86 | HR 83 | Ht 66.0 in | Wt 104.4 lb

## 2023-04-09 DIAGNOSIS — M4120 Other idiopathic scoliosis, site unspecified: Secondary | ICD-10-CM

## 2023-04-09 DIAGNOSIS — S81802S Unspecified open wound, left lower leg, sequela: Secondary | ICD-10-CM

## 2023-04-09 DIAGNOSIS — I701 Atherosclerosis of renal artery: Secondary | ICD-10-CM

## 2023-04-09 DIAGNOSIS — S81801S Unspecified open wound, right lower leg, sequela: Secondary | ICD-10-CM | POA: Diagnosis not present

## 2023-04-09 DIAGNOSIS — G894 Chronic pain syndrome: Secondary | ICD-10-CM | POA: Diagnosis not present

## 2023-04-09 NOTE — Progress Notes (Signed)
Established Patient Office Visit  Subjective:  Patient ID: [name] [name], female    DOB: 10-03-1935  Age: 88 y.o. MRN: [id number]  Chief Complaint  Patient presents with   Follow-up    2 week follow up    Patient is here for her 2 week follow up.  Went to podiatry yesterday, was  told that feet are in fair shape, and they did foot care for her, which consisted of nail debridement and trimming.   Leg wounds are much improved, patient and sister report.  She is getting these cared for at home and she feels well overall.   She's working on trying to find an assisted living facility or senior community she can move into.  She has been decided that this will be helpful for her isolation as well as helping her out as needed.   She is doing well otherwise, has been trying her hardest to stop her pain medications. Says when she absolutely has to she has been taking 1 x 5mg  oxycodone that she had left from [facility].   No other concerns at this time.   Past Medical History:  Diagnosis Date   Atrial fibrillation (HCC)    Basal cell carcinoma 2021   L temple   Chronic back pain    Congestive heart failure (CHF) (HCC)    History of total hip replacement (Bilateral) 01/12/2020   Left hip replacement done in 2019.  Right hip replacement done in 2012.   Hypertension    Hyponatremia 06/11/2020   NSTEMI (non-ST elevated myocardial infarction) (HCC) 06/11/2020   Pharmacologic therapy 01/12/2020   Problems influencing health status 01/12/2020   Scoliosis     Past Surgical History:  Procedure Laterality Date   ABDOMINAL SURGERY     bilateral hip replacement     CORONARY STENT INTERVENTION N/A 06/12/2020   Procedure: CORONARY STENT INTERVENTION;  Surgeon: Iran Ouch, MD;  Location: [facility];  Service: Cardiovascular;  Laterality: N/A;  LAD   ESOPHAGOGASTRODUODENOSCOPY (EGD) WITH PROPOFOL N/A 02/24/2023   Procedure: ESOPHAGOGASTRODUODENOSCOPY (EGD) WITH PROPOFOL;  Surgeon:  Midge Minium, MD;  Location: [facility];  Service: Endoscopy;  Laterality: N/A;   LEFT HEART CATH AND CORONARY ANGIOGRAPHY N/A 06/12/2020   Procedure: LEFT HEART CATH AND CORONARY ANGIOGRAPHY;  Surgeon: Iran Ouch, MD;  Location: [facility];  Service: Cardiovascular;  Laterality: N/A;   LOWER EXTREMITY ANGIOGRAPHY Right 02/25/2023   Procedure: Lower Extremity Angiography;  Surgeon: Renford Dills, MD;  Location: [facility];  Service: Cardiovascular;  Laterality: Right;   TONSILLECTOMY      Social History   Socioeconomic History   Marital status: Single    Spouse name: Not on file   Number of children: 0   Years of education: Not on file   Highest education level: Not on file  Occupational History   Not on file  Tobacco Use   Smoking status: Former    Packs/day: 0.50    Years: 68.00    Additional pack years: 0.00    Total pack years: 34.00    Types: Cigarettes    Quit date: 06/11/2020    Years since quitting: 2.8   Smokeless tobacco: Never  Substance and Sexual Activity   Alcohol use: Not Currently   Drug use: Not Currently   Sexual activity: Not on file  Other Topics Concern   Not on file  Social History Narrative   Not on file   Social Determinants of  Health   Financial Resource Strain: Not on file  Food Insecurity: Not on file  Transportation Needs: No Transportation Needs (12/25/2022)   PRAPARE - Administrator, Civil Service (Medical): No    Lack of Transportation (Non-Medical): No  Physical Activity: Insufficiently Active (12/25/2022)   Exercise Vital Sign    Days of Exercise per Week: 7 days    Minutes of Exercise per Session: 20 min  Stress: Stress Concern Present (12/25/2022)   [employer] of Occupational Health - Occupational Stress Questionnaire    Feeling of Stress : Very much  Social Connections: Socially Isolated (12/25/2022)   Social Connection and Isolation Panel [NHANES]    Frequency of Communication with  Friends and Family: Never    Frequency of Social Gatherings with Friends and Family: Never    Attends Religious Services: Never    Database administrator or Organizations: No    Attends Banker Meetings: Never    Marital Status: Never married  Intimate Partner Violence: Not At Risk (12/25/2022)   Humiliation, Afraid, Rape, and Kick questionnaire    Fear of Current or Ex-Partner: No    Emotionally Abused: No    Physically Abused: No    Sexually Abused: No    Family History  Problem Relation Age of Onset   CVA Father    Heart disease Father    Arthritis/Rheumatoid Sister     Allergies  Allergen Reactions   Spironolactone Anaphylaxis   Fluogen [Influenza Virus Vaccine] Hives   Statins     Dizziness & weakness   Sulfa Antibiotics Other (See Comments)    Unknown, happened as a child    Review of Systems  Skin:        Wounds on bilateral lower legs.  Improved in appearance, healing well.   All other systems reviewed and are negative.      Objective:   BP 128/86   Pulse 83   Ht 5\' 6"  (1.676 m)   Wt 104 lb 6.4 oz (47.4 kg)   SpO2 97%   BMI 16.85 kg/m   Vitals:   04/09/23 1021  BP: 128/86  Pulse: 83  Height: 5\' 6"  (1.676 m)  Weight: 104 lb 6.4 oz (47.4 kg)  SpO2: 97%  BMI (Calculated): 16.86    Physical Exam Vitals and nursing note reviewed.  Constitutional:      Appearance: Normal appearance. She is normal weight.  HENT:     Head: Normocephalic.  Eyes:     Pupils: Pupils are equal, round, and reactive to light.  Cardiovascular:     Rate and Rhythm: Normal rate.  Pulmonary:     Effort: Pulmonary effort is normal.  Skin:    Coloration: Skin is mottled.       Neurological:     General: No focal deficit present.     Mental Status: She is alert and oriented to person, place, and time. Mental status is at baseline.  Psychiatric:        Behavior: Behavior normal.        Thought Content: Thought content normal.        Judgment: Judgment  normal.      No results found for any visits on 04/09/23.  Recent Results (from the past 2160 hour(s))  Comprehensive metabolic panel     Status: Abnormal   Collection Time: 02/14/23  2:11 PM  Result Value Ref Range   Sodium 130 (L) 135 - 145 mmol/L   Potassium 4.6 3.5 -  5.1 mmol/L   Chloride 95 (L) 98 - 111 mmol/L   CO2 25 22 - 32 mmol/L   Glucose, Bld 132 (H) 70 - 99 mg/dL    Comment: Glucose reference range applies only to samples taken after fasting for at least 8 hours.   BUN 15 8 - 23 mg/dL   Creatinine, Ser 1.61 0.44 - 1.00 mg/dL   Calcium 8.5 (L) 8.9 - 10.3 mg/dL   Total Protein 7.0 6.5 - 8.1 g/dL   Albumin 3.0 (L) 3.5 - 5.0 g/dL   AST 31 15 - 41 U/L   ALT 23 0 - 44 U/L   Alkaline Phosphatase 118 38 - 126 U/L   Total Bilirubin 0.6 0.3 - 1.2 mg/dL   GFR, Estimated >09 >60 mL/min    Comment: (NOTE) Calculated using the CKD-EPI Creatinine Equation (2021)    Anion gap 10 5 - 15    Comment: Performed at [facility], [address] [place], [place] [address]  Lactic acid, plasma     Status: None   Collection Time: 02/14/23  2:11 PM  Result Value Ref Range   Lactic Acid, Venous 1.8 0.5 - 1.9 mmol/L    Comment: Performed at [facility], [address] [place], [place] [address]  CBC with Differential     Status: Abnormal   Collection Time: 02/14/23  2:11 PM  Result Value Ref Range   WBC 8.6 4.0 - 10.5 K/uL   RBC 3.12 (L) 3.87 - 5.11 MIL/uL   Hemoglobin 8.4 (L) 12.0 - 15.0 g/dL   HCT 47.8 (L) 29.5 - 62.1 %   MCV 89.7 80.0 - 100.0 fL   MCH 26.9 26.0 - 34.0 pg   MCHC 30.0 30.0 - 36.0 g/dL   RDW 30.8 65.7 - 84.6 %   Platelets 460 (H) 150 - 400 K/uL   nRBC 0.0 0.0 - 0.2 %   Neutrophils Relative % 85 %   Neutro Abs 7.3 1.7 - 7.7 K/uL   Lymphocytes Relative 6 %   Lymphs Abs 0.5 (L) 0.7 - 4.0 K/uL   Monocytes Relative 9 %   Monocytes Absolute 0.7 0.1 - 1.0 K/uL   Eosinophils Relative 0 %   Eosinophils Absolute 0.0 0.0 - 0.5 K/uL   Basophils  Relative 0 %   Basophils Absolute 0.0 0.0 - 0.1 K/uL   Immature Granulocytes 0 %   Abs Immature Granulocytes 0.03 0.00 - 0.07 K/uL    Comment: Performed at [facility], [address] [place], [place] [address]  Protime-INR     Status: None   Collection Time: 02/14/23  2:11 PM  Result Value Ref Range   Prothrombin Time 13.9 11.4 - 15.2 seconds   INR 1.1 0.8 - 1.2    Comment: (NOTE) INR goal varies based on device and disease states. Performed at [facility], [address] [place], [place] [address]   Culture, blood (Routine x 2)     Status: None   Collection Time: 02/14/23  2:11 PM   Specimen: BLOOD  Result Value Ref Range   Specimen Description BLOOD LEFT AC    Special Requests      BOTTLES DRAWN AEROBIC AND ANAEROBIC Blood Culture adequate volume   Culture      NO GROWTH 5 DAYS Performed at [name], [address] [place], [place] [address]    Report Status 02/19/2023 FINAL   Basic metabolic panel     Status: Abnormal   Collection  Time: 02/20/23  5:00 PM  Result Value Ref Range   Sodium 125 (L) 135 - 145 mmol/L   Potassium 4.4 3.5 - 5.1 mmol/L   Chloride 92 (L) 98 - 111 mmol/L   CO2 22 22 - 32 mmol/L   Glucose, Bld 125 (H) 70 - 99 mg/dL    Comment: Glucose reference range applies only to samples taken after fasting for at least 8 hours.   BUN 43 (H) 8 - 23 mg/dL   Creatinine, Ser 2.13 (H) 0.44 - 1.00 mg/dL   Calcium 8.3 (L) 8.9 - 10.3 mg/dL   GFR, Estimated 49 (L) >60 mL/min    Comment: (NOTE) Calculated using the CKD-EPI Creatinine Equation (2021)    Anion gap 11 5 - 15    Comment: Performed at [facility], [address] [place], [place] [address]  CBC     Status: Abnormal   Collection Time: 02/20/23  5:00 PM  Result Value Ref Range   WBC 9.1 4.0 - 10.5 K/uL   RBC 3.21 (L) 3.87 - 5.11 MIL/uL   Hemoglobin 8.5 (L) 12.0 - 15.0 g/dL   HCT 84.6 (L) 96.2 - 95.2 %   MCV 84.7 80.0 - 100.0 fL   MCH 26.5 26.0 - 34.0  pg   MCHC 31.3 30.0 - 36.0 g/dL   RDW 84.1 32.4 - 40.1 %   Platelets 608 (H) 150 - 400 K/uL   nRBC 0.0 0.0 - 0.2 %    Comment: Performed at [facility], [address] [place], [place] [address]  Troponin I (High Sensitivity)     Status: None   Collection Time: 02/20/23  5:00 PM  Result Value Ref Range   Troponin I (High Sensitivity) 12 <18 ng/L    Comment: (NOTE) Elevated high sensitivity troponin I (hsTnI) values and significant  changes across serial measurements may suggest ACS but many other  chronic and acute conditions are known to elevate hsTnI results.  Refer to the "Links" section for chest pain algorithms and additional  guidance. Performed at [name], [address] [place], [place] [address]   Troponin I (High Sensitivity)     Status: None   Collection Time: 02/21/23  4:12 AM  Result Value Ref Range   Troponin I (High Sensitivity) 16 <18 ng/L    Comment: (NOTE) Elevated high sensitivity troponin I (hsTnI) values and significant  changes across serial measurements may suggest ACS but many other  chronic and acute conditions are known to elevate hsTnI results.  Refer to the "Links" section for chest pain algorithms and additional  guidance. Performed at [facility], [address] [place], [place] [address]   Comprehensive metabolic panel     Status: Abnormal   Collection Time: 02/21/23  4:12 AM  Result Value Ref Range   Sodium 130 (L) 135 - 145 mmol/L   Potassium 4.4 3.5 - 5.1 mmol/L   Chloride 97 (L) 98 - 111 mmol/L   CO2 25 22 - 32 mmol/L   Glucose, Bld 100 (H) 70 - 99 mg/dL    Comment: Glucose reference range applies only to samples taken after fasting for at least 8 hours.   BUN 40 (H) 8 - 23 mg/dL   Creatinine, Ser 2.59 (H) 0.44 - 1.00 mg/dL   Calcium 7.9 (L) 8.9 - 10.3 mg/dL   Total Protein 5.7 (L) 6.5 - 8.1 g/dL   Albumin 2.3 (L) 3.5 - 5.0 g/dL   AST 25 15 - 41  U/L   ALT 27 0 - 44 U/L   Alkaline Phosphatase 117  38 - 126 U/L   Total Bilirubin 0.7 0.3 - 1.2 mg/dL   GFR, Estimated 54 (L) >60 mL/min    Comment: (NOTE) Calculated using the CKD-EPI Creatinine Equation (2021)    Anion gap 8 5 - 15    Comment: Performed at [facility], [address] [place], [place] [address]  CBC     Status: Abnormal   Collection Time: 02/21/23  4:12 AM  Result Value Ref Range   WBC 11.6 (H) 4.0 - 10.5 K/uL   RBC 2.80 (L) 3.87 - 5.11 MIL/uL   Hemoglobin 7.3 (L) 12.0 - 15.0 g/dL   HCT 08.6 (L) 57.8 - 46.9 %   MCV 84.6 80.0 - 100.0 fL   MCH 26.1 26.0 - 34.0 pg   MCHC 30.8 30.0 - 36.0 g/dL   RDW 62.9 52.8 - 41.3 %   Platelets 459 (H) 150 - 400 K/uL   nRBC 0.0 0.0 - 0.2 %    Comment: Performed at [facility], [address] [place], [place] [address]  Type and screen     Status: None   Collection Time: 02/21/23  4:12 AM  Result Value Ref Range   ABO/RH(D) O POS    Antibody Screen NEG    Sample Expiration 02/24/2023,2359    Unit Number [id number]    Blood Component Type RED CELLS,LR    Unit division 00    Status of Unit ISSUED,FINAL    Transfusion Status OK TO TRANSFUSE    Crossmatch Result      Compatible Performed at [facility], [address] [place], [place] [address]   BPAM RBC     Status: None   Collection Time: 02/21/23  4:12 AM  Result Value Ref Range   ISSUE DATE / TIME [id number]    Blood Product Unit Number [id number]    PRODUCT CODE [id number]    Unit Type and Rh 5100    Blood Product Expiration Date 557322025427   Prepare RBC (crossmatch)     Status: None   Collection Time: 02/21/23 11:07 AM  Result Value Ref Range   Order Confirmation      ORDER PROCESSED BY BLOOD BANK Performed at [facility], [address] [place], [place] [address]   ABO/Rh     Status: None   Collection Time: 02/21/23 11:47 AM  Result Value Ref Range   ABO/RH(D)      O POS Performed at [facility], [address] [place], [place] [address]    CBC     Status: Abnormal   Collection Time: 02/22/23  5:36 AM  Result Value Ref Range   WBC 8.9 4.0 - 10.5 K/uL   RBC 3.60 (L) 3.87 - 5.11 MIL/uL   Hemoglobin 9.7 (L) 12.0 - 15.0 g/dL    Comment: REPEATED TO VERIFY   HCT 30.0 (L) 36.0 - 46.0 %   MCV 83.3 80.0 - 100.0 fL   MCH 26.9 26.0 - 34.0 pg   MCHC 32.3 30.0 - 36.0 g/dL   RDW 51.7 61.6 - 07.3 %   Platelets 428 (H) 150 - 400 K/uL   nRBC 0.0 0.0 - 0.2 %    Comment: Performed at [facility], [address], [place], [place] [address]  Basic metabolic panel     Status: Abnormal   Collection Time: 02/22/23  5:36 AM  Result Value Ref Range  Sodium 134 (L) 135 - 145 mmol/L   Potassium 3.4 (L) 3.5 - 5.1 mmol/L   Chloride 100 98 - 111 mmol/L   CO2 25 22 - 32 mmol/L   Glucose, Bld 100 (H) 70 - 99 mg/dL    Comment: Glucose reference range applies only to samples taken after fasting for at least 8 hours.   BUN 28 (H) 8 - 23 mg/dL   Creatinine, Ser 1.61 0.44 - 1.00 mg/dL   Calcium 8.0 (L) 8.9 - 10.3 mg/dL   GFR, Estimated >09 >60 mL/min    Comment: (NOTE) Calculated using the CKD-EPI Creatinine Equation (2021)    Anion gap 9 5 - 15    Comment: Performed at [facility], [address] [place], [place] [address]  Hemoglobin     Status: Abnormal   Collection Time: 02/23/23  7:40 AM  Result Value Ref Range   Hemoglobin 9.3 (L) 12.0 - 15.0 g/dL    Comment: Performed at [facility], [address], [place], [place] [address]  Basic metabolic panel     Status: Abnormal   Collection Time: 02/23/23  7:40 AM  Result Value Ref Range   Sodium 132 (L) 135 - 145 mmol/L   Potassium 3.5 3.5 - 5.1 mmol/L   Chloride 101 98 - 111 mmol/L   CO2 24 22 - 32 mmol/L   Glucose, Bld 138 (H) 70 - 99 mg/dL    Comment: Glucose reference range applies only to samples taken after fasting for at least 8 hours.   BUN 17 8 - 23 mg/dL   Creatinine, Ser 4.78 0.44 - 1.00 mg/dL   Calcium 8.0 (L) 8.9 - 10.3 mg/dL   GFR,  Estimated >29 >56 mL/min    Comment: (NOTE) Calculated using the CKD-EPI Creatinine Equation (2021)    Anion gap 7 5 - 15    Comment: Performed at [facility], [address] [place], [place] [address]  Magnesium     Status: None   Collection Time: 02/23/23  7:40 AM  Result Value Ref Range   Magnesium 1.8 1.7 - 2.4 mg/dL    Comment: Performed at [facility], [address] [place], [place] [address]  Hemoglobin     Status: Abnormal   Collection Time: 02/24/23  8:34 AM  Result Value Ref Range   Hemoglobin 9.5 (L) 12.0 - 15.0 g/dL    Comment: Performed at [facility], [address] [place], [place] [address]  CBC     Status: Abnormal   Collection Time: 02/25/23  4:18 AM  Result Value Ref Range   WBC 8.1 4.0 - 10.5 K/uL   RBC 3.59 (L) 3.87 - 5.11 MIL/uL   Hemoglobin 9.5 (L) 12.0 - 15.0 g/dL   HCT 52.8 (L) 41.3 - 24.4 %   MCV 87.5 80.0 - 100.0 fL   MCH 26.5 26.0 - 34.0 pg   MCHC 30.3 30.0 - 36.0 g/dL   RDW 01.0 27.2 - 53.6 %   Platelets 340 150 - 400 K/uL   nRBC 0.0 0.0 - 0.2 %    Comment: Performed at [facility], [address], [place], [place] [address]  Basic metabolic panel     Status: Abnormal   Collection Time: 02/25/23  4:18 AM  Result Value Ref Range   Sodium 134 (L) 135 - 145 mmol/L   Potassium 3.4 (L) 3.5 - 5.1 mmol/L   Chloride 99 98 - 111 mmol/L   CO2 26 22 -  32 mmol/L   Glucose, Bld 99 70 - 99 mg/dL    Comment: Glucose reference range applies only to samples taken after fasting for at least 8 hours.   BUN 16 8 - 23 mg/dL   Creatinine, Ser 5.62 0.44 - 1.00 mg/dL   Calcium 7.8 (L) 8.9 - 10.3 mg/dL   GFR, Estimated >13 >08 mL/min    Comment: (NOTE) Calculated using the CKD-EPI Creatinine Equation (2021)    Anion gap 9 5 - 15    Comment: Performed at [facility], [address] [place], [place] [address]  MRSA Next Gen by PCR, Nasal     Status: None   Collection Time: 02/25/23  6:06 AM   Specimen:  Nasal Mucosa; Nasal Swab  Result Value Ref Range   MRSA by PCR Next Gen NOT DETECTED NOT DETECTED    Comment: (NOTE) The GeneXpert MRSA Assay (FDA approved for NASAL specimens only), is one component of a comprehensive MRSA colonization surveillance program. It is not intended to diagnose MRSA infection nor to guide or monitor treatment for MRSA infections. Test performance is not FDA approved in patients less than 72 years old. Performed at [facility] [address] [place], [place] [address]   CBC     Status: Abnormal   Collection Time: 02/26/23  4:33 AM  Result Value Ref Range   WBC 9.2 4.0 - 10.5 K/uL   RBC 3.47 (L) 3.87 - 5.11 MIL/uL   Hemoglobin 9.3 (L) 12.0 - 15.0 g/dL   HCT 52.8 (L) 41.3 - 24.4 %   MCV 85.6 80.0 - 100.0 fL   MCH 26.8 26.0 - 34.0 pg   MCHC 31.3 30.0 - 36.0 g/dL   RDW 01.0 27.2 - 53.6 %   Platelets 312 150 - 400 K/uL   nRBC 0.0 0.0 - 0.2 %    Comment: Performed at [facility], [address], [place], [place] [address]  Basic metabolic panel     Status: Abnormal   Collection Time: 02/26/23  4:33 AM  Result Value Ref Range   Sodium 130 (L) 135 - 145 mmol/L   Potassium 3.5 3.5 - 5.1 mmol/L   Chloride 101 98 - 111 mmol/L   CO2 24 22 - 32 mmol/L   Glucose, Bld 93 70 - 99 mg/dL    Comment: Glucose reference range applies only to samples taken after fasting for at least 8 hours.   BUN 16 8 - 23 mg/dL   Creatinine, Ser 4.74 0.44 - 1.00 mg/dL   Calcium 7.8 (L) 8.9 - 10.3 mg/dL   GFR, Estimated >25 >95 mL/min    Comment: (NOTE) Calculated using the CKD-EPI Creatinine Equation (2021)    Anion gap 5 5 - 15    Comment: Performed at [facility], [address] [place], [place] [address]  Ferritin     Status: None   Collection Time: 02/26/23  4:33 AM  Result Value Ref Range   Ferritin 287 11 - 307 ng/mL    Comment: Performed at [facility], [address] [place], [place] [address]  CBC     Status: Abnormal    Collection Time: 02/27/23  4:39 AM  Result Value Ref Range   WBC 9.6 4.0 - 10.5 K/uL   RBC 3.36 (L) 3.87 - 5.11 MIL/uL   Hemoglobin 9.0 (L) 12.0 - 15.0 g/dL   HCT 95.1 (L) 88.4 - 16.6 %   MCV 85.7 80.0 - 100.0 fL   MCH  26.8 26.0 - 34.0 pg   MCHC 31.3 30.0 - 36.0 g/dL   RDW 16.1 09.6 - 04.5 %   Platelets 296 150 - 400 K/uL   nRBC 0.0 0.0 - 0.2 %    Comment: Performed at [facility], [address] [place], [place] [address]  Basic metabolic panel     Status: Abnormal   Collection Time: 02/28/23  4:38 AM  Result Value Ref Range   Sodium 131 (L) 135 - 145 mmol/L   Potassium 4.3 3.5 - 5.1 mmol/L   Chloride 100 98 - 111 mmol/L   CO2 25 22 - 32 mmol/L   Glucose, Bld 88 70 - 99 mg/dL    Comment: Glucose reference range applies only to samples taken after fasting for at least 8 hours.   BUN 29 (H) 8 - 23 mg/dL   Creatinine, Ser 1.91 0.44 - 1.00 mg/dL   Calcium 7.9 (L) 8.9 - 10.3 mg/dL   GFR, Estimated >47 >82 mL/min    Comment: (NOTE) Calculated using the CKD-EPI Creatinine Equation (2021)    Anion gap 6 5 - 15    Comment: Performed at [facility], [address] [place], [place] [address]  CBC     Status: Abnormal   Collection Time: 02/28/23  4:38 AM  Result Value Ref Range   WBC 9.0 4.0 - 10.5 K/uL   RBC 3.13 (L) 3.87 - 5.11 MIL/uL   Hemoglobin 8.3 (L) 12.0 - 15.0 g/dL   HCT 30.8 (L) 65.7 - 84.6 %   MCV 86.6 80.0 - 100.0 fL   MCH 26.5 26.0 - 34.0 pg   MCHC 30.6 30.0 - 36.0 g/dL   RDW 96.2 95.2 - 84.1 %   Platelets 285 150 - 400 K/uL   nRBC 0.0 0.0 - 0.2 %    Comment: Performed at [facility], [address] [place], [place] [address]  CBC     Status: Abnormal   Collection Time: 03/01/23  4:53 AM  Result Value Ref Range   WBC 7.7 4.0 - 10.5 K/uL   RBC 3.37 (L) 3.87 - 5.11 MIL/uL   Hemoglobin 9.0 (L) 12.0 - 15.0 g/dL   HCT 10.2 (L) 72.5 - 36.6 %   MCV 89.6 80.0 - 100.0 fL   MCH 26.7 26.0 - 34.0 pg   MCHC 29.8 (L) 30.0 - 36.0 g/dL    RDW 44.0 34.7 - 42.5 %   Platelets 279 150 - 400 K/uL   nRBC 0.0 0.0 - 0.2 %    Comment: Performed at [facility], [address], [place], [place] [address]       Assessment & Plan:   Problem List Items Addressed This Visit       Active Problems   Idiopathic scoliosis and kyphoscoliosis   Chronic pain syndrome (Chronic)    Stable.  Continue use of oxycodone 5mg  prn.   Will reassess at follow up.       Other Visit Diagnoses     Wound, open, leg, left, sequela    -  Primary   Improving well Continue the treatment at home.  Will reassess at follow up.   Wound, open, leg, right, sequela       Improving well Continue the treatment at home.  Will reassess at follow up.       Return in about 1 month (around 05/10/2023) for F/U.   Total time spent: 30 minutes  Miki Kins, FNP  04/09/2023  This document may have been prepared by [employer] Voice Recognition software and as such may include unintentional dictation errors.

## 2023-04-09 NOTE — Assessment & Plan Note (Signed)
Stable.  Continue use of oxycodone 5mg  prn.   Will reassess at follow up.

## 2023-04-10 DIAGNOSIS — E569 Vitamin deficiency, unspecified: Secondary | ICD-10-CM | POA: Diagnosis not present

## 2023-04-10 DIAGNOSIS — L03115 Cellulitis of right lower limb: Secondary | ICD-10-CM | POA: Diagnosis not present

## 2023-04-10 DIAGNOSIS — M6259 Muscle wasting and atrophy, not elsewhere classified, multiple sites: Secondary | ICD-10-CM | POA: Diagnosis not present

## 2023-04-14 ENCOUNTER — Other Ambulatory Visit (INDEPENDENT_AMBULATORY_CARE_PROVIDER_SITE_OTHER): Payer: Self-pay | Admitting: Vascular Surgery

## 2023-04-14 DIAGNOSIS — Z9889 Other specified postprocedural states: Secondary | ICD-10-CM

## 2023-04-16 ENCOUNTER — Other Ambulatory Visit (INDEPENDENT_AMBULATORY_CARE_PROVIDER_SITE_OTHER): Payer: Self-pay | Admitting: Nurse Practitioner

## 2023-04-16 ENCOUNTER — Ambulatory Visit (INDEPENDENT_AMBULATORY_CARE_PROVIDER_SITE_OTHER): Payer: 59

## 2023-04-16 ENCOUNTER — Ambulatory Visit (INDEPENDENT_AMBULATORY_CARE_PROVIDER_SITE_OTHER): Payer: 59 | Admitting: Nurse Practitioner

## 2023-04-16 ENCOUNTER — Encounter (INDEPENDENT_AMBULATORY_CARE_PROVIDER_SITE_OTHER): Payer: Self-pay | Admitting: Nurse Practitioner

## 2023-04-16 VITALS — BP 166/87 | HR 65 | Resp 18 | Ht 66.0 in | Wt 103.4 lb

## 2023-04-16 DIAGNOSIS — I872 Venous insufficiency (chronic) (peripheral): Secondary | ICD-10-CM

## 2023-04-16 DIAGNOSIS — I10 Essential (primary) hypertension: Secondary | ICD-10-CM | POA: Diagnosis not present

## 2023-04-16 DIAGNOSIS — L03119 Cellulitis of unspecified part of limb: Secondary | ICD-10-CM | POA: Diagnosis not present

## 2023-04-16 DIAGNOSIS — Z9889 Other specified postprocedural states: Secondary | ICD-10-CM | POA: Diagnosis not present

## 2023-04-16 DIAGNOSIS — I739 Peripheral vascular disease, unspecified: Secondary | ICD-10-CM | POA: Diagnosis not present

## 2023-04-16 NOTE — Progress Notes (Signed)
Subjective:    Patient ID: [name] [name], female    DOB: 10/31/1935, 88 y.o.   MRN: [id number] Chief Complaint  Patient presents with   Follow-up    6 WEEK FOLLOW UP Bilateral lower extremity ultrasound with ABI's    The patient returns to the office for followup and review status post angiogram with intervention on 02/25/2023.   Procedure: Procedure(s) Performed:             1.  Introduction catheter into right lower extremity 3rd order catheter placement              2.    Contrast injection right lower extremity for distal runoff             3.  Percutaneous transluminal angioplasty and stent placement right superficial femoral artery             4.  Percutaneous transluminal angioplasty and stent placement right external iliac artery.               5.  Star close closure left common femoral arteriotomy   The patient notes improvement in the lower extremity symptoms. No interval shortening of the patient's claudication distance or rest pain symptoms.  The patient notes that following her discharge from the hospital the wounds that she had healed but then they recurred.  She denies any specific trauma to the areas.  She also has wounds on her left leg that are scabbed over.  The wounds on her right appear to be newer, shallow and are currently weeping slightly.  She has been receiving wound care by the wound care nurses that come out to her home.  There have been no significant changes to the patient's overall health care.  No documented history of amaurosis fugax or recent TIA symptoms. There are no recent neurological changes noted. No documented history of DVT, PE or superficial thrombophlebitis. The patient denies recent episodes of angina or shortness of breath.   ABI's Rt=1.16 and Lt=1.07  (no previous ABIs) Duplex US of the bilateral tibial vessels reveals biphasic waveforms with normal toe waveforms.    Review of Systems     Objective:   Physical Exam  BP (!) 166/87  (BP Location: Left Arm)   Pulse 65   Resp 18   Ht 5\' 6"  (1.676 m)   Wt 103 lb 6.4 oz (46.9 kg)   BMI 16.69 kg/m   Past Medical History:  Diagnosis Date   Atrial fibrillation (HCC)    Basal cell carcinoma 2021   L temple   Chronic back pain    Congestive heart failure (CHF) (HCC)    History of total hip replacement (Bilateral) 01/12/2020   Left hip replacement done in 2019.  Right hip replacement done in 2012.   Hypertension    Hyponatremia 06/11/2020   NSTEMI (non-ST elevated myocardial infarction) (HCC) 06/11/2020   Pharmacologic therapy 01/12/2020   Problems influencing health status 01/12/2020   Scoliosis     Social History   Socioeconomic History   Marital status: Single    Spouse name: Not on file   Number of children: 0   Years of education: Not on file   Highest education level: Not on file  Occupational History   Not on file  Tobacco Use   Smoking status: Former    Packs/day: 0.50    Years: 68.00    Additional pack years: 0.00    Total pack years: 34.00    Types: Cigarettes  Quit date: 06/11/2020    Years since quitting: 2.8   Smokeless tobacco: Never  Substance and Sexual Activity   Alcohol use: Not Currently   Drug use: Not Currently   Sexual activity: Not on file  Other Topics Concern   Not on file  Social History Narrative   Not on file   Social Determinants of Health   Financial Resource Strain: Not on file  Food Insecurity: Not on file  Transportation Needs: No Transportation Needs (12/25/2022)   PRAPARE - Administrator, Civil Service (Medical): No    Lack of Transportation (Non-Medical): No  Physical Activity: Insufficiently Active (12/25/2022)   Exercise Vital Sign    Days of Exercise per Week: 7 days    Minutes of Exercise per Session: 20 min  Stress: Stress Concern Present (12/25/2022)   [employer] of Occupational Health - Occupational Stress Questionnaire    Feeling of Stress : Very much  Social Connections:  Socially Isolated (12/25/2022)   Social Connection and Isolation Panel [NHANES]    Frequency of Communication with Friends and Family: Never    Frequency of Social Gatherings with Friends and Family: Never    Attends Religious Services: Never    Database administrator or Organizations: No    Attends Banker Meetings: Never    Marital Status: Never married  Intimate Partner Violence: Not At Risk (12/25/2022)   Humiliation, Afraid, Rape, and Kick questionnaire    Fear of Current or Ex-Partner: No    Emotionally Abused: No    Physically Abused: No    Sexually Abused: No    Past Surgical History:  Procedure Laterality Date   ABDOMINAL SURGERY     bilateral hip replacement     CORONARY STENT INTERVENTION N/A 06/12/2020   Procedure: CORONARY STENT INTERVENTION;  Surgeon: Iran Ouch, MD;  Location: [facility];  Service: Cardiovascular;  Laterality: N/A;  LAD   ESOPHAGOGASTRODUODENOSCOPY (EGD) WITH PROPOFOL N/A 02/24/2023   Procedure: ESOPHAGOGASTRODUODENOSCOPY (EGD) WITH PROPOFOL;  Surgeon: Midge Minium, MD;  Location: [facility];  Service: Endoscopy;  Laterality: N/A;   LEFT HEART CATH AND CORONARY ANGIOGRAPHY N/A 06/12/2020   Procedure: LEFT HEART CATH AND CORONARY ANGIOGRAPHY;  Surgeon: Iran Ouch, MD;  Location: [facility];  Service: Cardiovascular;  Laterality: N/A;   LOWER EXTREMITY ANGIOGRAPHY Right 02/25/2023   Procedure: Lower Extremity Angiography;  Surgeon: Renford Dills, MD;  Location: [facility];  Service: Cardiovascular;  Laterality: Right;   TONSILLECTOMY      Family History  Problem Relation Age of Onset   CVA Father    Heart disease Father    Arthritis/Rheumatoid Sister     Allergies  Allergen Reactions   Spironolactone Anaphylaxis   Fluogen [Influenza Virus Vaccine] Hives   Statins     Dizziness & weakness   Sulfa Antibiotics Other (See Comments)    Unknown, happened as a child       Latest Ref Rng &  Units 03/01/2023    4:53 AM 02/28/2023    4:38 AM 02/27/2023    4:39 AM  CBC  WBC 4.0 - 10.5 K/uL 7.7  9.0  9.6   Hemoglobin 12.0 - 15.0 g/dL 9.0  8.3  9.0   Hematocrit 36.0 - 46.0 % 30.2  27.1  28.8   Platelets 150 - 400 K/uL 279  285  296       CMP     Component Value Date/Time  NA 131 (L) 02/28/2023 0438   K 4.3 02/28/2023 0438   CL 100 02/28/2023 0438   CO2 25 02/28/2023 0438   GLUCOSE 88 02/28/2023 0438   BUN 29 (H) 02/28/2023 0438   CREATININE 0.60 02/28/2023 0438   CALCIUM 7.9 (L) 02/28/2023 0438   PROT 5.7 (L) 02/21/2023 0412   ALBUMIN 2.3 (L) 02/21/2023 0412   AST 25 02/21/2023 0412   ALT 27 02/21/2023 0412   ALKPHOS 117 02/21/2023 0412   BILITOT 0.7 02/21/2023 0412   GFRNONAA >60 02/28/2023 0438     No results found.     Assessment & Plan:   1. Peripheral arterial disease with history of revascularization (HCC) Recommend:  The patient is status post successful angiogram with intervention.  The patient reports that the claudication symptoms and leg pain has improved.   The patient denies lifestyle limiting changes at this point in time.  No further invasive studies, angiography or surgery at this time The patient should continue walking and begin a more formal exercise program.  The patient should continue antiplatelet therapy and aggressive treatment of the lipid abnormalities  Continued surveillance is indicated as atherosclerosis is likely to progress with time.    Patient should undergo noninvasive studies as ordered. The patient will follow up with me to review the studies.   2. Primary hypertension Continue antihypertensive medications as already ordered, these medications have been reviewed and there are no changes at this time.  3. Cellulitis of lower extremity, unspecified laterality The patient has wounds on the left leg which are scabbed over but wounds on the right which appear to be oozing some serous fluid.  There does not appear to be an  active infection but given the appearance we will culture it and send in antibiotics pending the results of the culture.  Wound wrappings were applied today per the patient's description and home health will resume.  4. Venous insufficiency (chronic) (peripheral) I have a suspicion that the recurrence of the wounds may be related to having some venous insufficiency bilaterally.  Following discussion with the patient she will return with bilateral venous reflux in order to evaluate for possible treatment.   Current Outpatient Medications on File Prior to Visit  Medication Sig Dispense Refill   aspirin EC 81 MG tablet Take 1 tablet (81 mg total) by mouth daily. Swallow whole. 30 tablet 0   clopidogrel (PLAVIX) 75 MG tablet Take 1 tablet (75 mg total) by mouth daily with breakfast. 90 tablet 1   feeding supplement (ENSURE ENLIVE / ENSURE PLUS) LIQD Take 237 mLs by mouth 2 (two) times daily between meals. 237 mL 12   gabapentin (NEURONTIN) 400 MG capsule Take 400 mg by mouth 4 (four) times daily.     lisinopril (ZESTRIL) 5 MG tablet Take 5 mg by mouth daily.     meloxicam (MOBIC) 15 MG tablet Take 15 mg by mouth daily.     metoprolol succinate (TOPROL-XL) 25 MG 24 hr tablet Take 25 mg by mouth daily.     oxyCODONE (OXY IR/ROXICODONE) 5 MG immediate release tablet Take 1 tablet (5 mg total) by mouth every 6 (six) hours as needed for severe pain. 10 tablet 0   terbinafine (LAMISIL) 250 MG tablet Take 1 tablet (250 mg total) by mouth daily. 42 tablet 1   No current facility-administered medications on file prior to visit.    There are no Patient Instructions on file for this visit. No follow-ups on file.   Erma Pinto  Manson Passey, NP

## 2023-04-19 LAB — AEROBIC CULTURE

## 2023-04-21 ENCOUNTER — Other Ambulatory Visit (INDEPENDENT_AMBULATORY_CARE_PROVIDER_SITE_OTHER): Payer: Self-pay | Admitting: Nurse Practitioner

## 2023-04-21 ENCOUNTER — Telehealth (INDEPENDENT_AMBULATORY_CARE_PROVIDER_SITE_OTHER): Payer: Self-pay

## 2023-04-21 MED ORDER — DOXYCYCLINE HYCLATE 100 MG PO CAPS: 100.0000 mg | ORAL_CAPSULE | Freq: Two times a day (BID) | ORAL | 0 refills | Status: DC

## 2023-04-21 NOTE — Telephone Encounter (Signed)
As of right now the culture has not yet returned, they can take a week or so to come back

## 2023-04-21 NOTE — Progress Notes (Signed)
Patient sister [name]) was notified that culture results were received and antibiotic was sent to pharmacy

## 2023-04-21 NOTE — Progress Notes (Signed)
The culture cam back and I am sending in Rx for the patient

## 2023-04-21 NOTE — Telephone Encounter (Signed)
Spoke with [name] [name] and let her know that results could take up to a week.  She states understanding.

## 2023-04-22 DIAGNOSIS — M6259 Muscle wasting and atrophy, not elsewhere classified, multiple sites: Secondary | ICD-10-CM | POA: Diagnosis not present

## 2023-04-22 DIAGNOSIS — E569 Vitamin deficiency, unspecified: Secondary | ICD-10-CM | POA: Diagnosis not present

## 2023-04-22 DIAGNOSIS — L03115 Cellulitis of right lower limb: Secondary | ICD-10-CM | POA: Diagnosis not present

## 2023-04-28 ENCOUNTER — Ambulatory Visit: Payer: 59

## 2023-04-28 DIAGNOSIS — E569 Vitamin deficiency, unspecified: Secondary | ICD-10-CM | POA: Diagnosis not present

## 2023-04-28 DIAGNOSIS — M6259 Muscle wasting and atrophy, not elsewhere classified, multiple sites: Secondary | ICD-10-CM | POA: Diagnosis not present

## 2023-04-28 DIAGNOSIS — L03115 Cellulitis of right lower limb: Secondary | ICD-10-CM | POA: Diagnosis not present

## 2023-04-29 ENCOUNTER — Other Ambulatory Visit (INDEPENDENT_AMBULATORY_CARE_PROVIDER_SITE_OTHER): Payer: Self-pay | Admitting: Nurse Practitioner

## 2023-04-29 DIAGNOSIS — I872 Venous insufficiency (chronic) (peripheral): Secondary | ICD-10-CM

## 2023-04-30 ENCOUNTER — Ambulatory Visit (INDEPENDENT_AMBULATORY_CARE_PROVIDER_SITE_OTHER): Payer: 59

## 2023-04-30 ENCOUNTER — Encounter (INDEPENDENT_AMBULATORY_CARE_PROVIDER_SITE_OTHER): Payer: Self-pay | Admitting: Nurse Practitioner

## 2023-04-30 ENCOUNTER — Telehealth (INDEPENDENT_AMBULATORY_CARE_PROVIDER_SITE_OTHER): Payer: Self-pay

## 2023-04-30 ENCOUNTER — Ambulatory Visit (INDEPENDENT_AMBULATORY_CARE_PROVIDER_SITE_OTHER): Payer: 59 | Admitting: Nurse Practitioner

## 2023-04-30 VITALS — BP 184/74 | HR 61 | Resp 15 | Wt 103.0 lb

## 2023-04-30 DIAGNOSIS — I872 Venous insufficiency (chronic) (peripheral): Secondary | ICD-10-CM | POA: Diagnosis not present

## 2023-04-30 DIAGNOSIS — Z9889 Other specified postprocedural states: Secondary | ICD-10-CM | POA: Diagnosis not present

## 2023-04-30 DIAGNOSIS — S81801D Unspecified open wound, right lower leg, subsequent encounter: Secondary | ICD-10-CM

## 2023-04-30 DIAGNOSIS — I739 Peripheral vascular disease, unspecified: Secondary | ICD-10-CM | POA: Diagnosis not present

## 2023-04-30 DIAGNOSIS — I10 Essential (primary) hypertension: Secondary | ICD-10-CM | POA: Diagnosis not present

## 2023-04-30 NOTE — Telephone Encounter (Signed)
Verbal orders were received by [name] with [facility] to start weekly bilateral unna wraps and place xeroform over open wounds.

## 2023-05-01 ENCOUNTER — Encounter (INDEPENDENT_AMBULATORY_CARE_PROVIDER_SITE_OTHER): Payer: Self-pay | Admitting: Nurse Practitioner

## 2023-05-01 NOTE — Progress Notes (Signed)
Subjective:    Patient ID: [name] [name], female    DOB: 05/15/1935, 88 y.o.   MRN: [id number] Chief Complaint  Patient presents with   Follow-up    Ultrasound follow up    Okla Albor is an 88 year old female who returns today for follow-up studies in order to evaluate for possible chronic venous insufficiency.  She recently underwent intervention on her right lower extremity due to slow healing wounds.  Despite this she had normal ABIs at her follow-up but she still continues to have wounds on her bilateral lower extremities.  These wounds are very shallow but they are uncomfortable for the patient and they have been slow in healing.  She notes that she develops these wounds and then they heal and then shortly after healing she seems to develop them again.  There was some concern for infection the patient had a culture done recently and we will send in antibiotics and the wounds have improved since that time.  The patient's family member notes that she is trying to move into an assisted living facility but she cannot while she continues to have these open wounds.  Today noninvasive study showed no evidence of DVT or superficial phlebitis bilaterally.  No evidence of deep venous insufficiency or superficial venous reflux noted bilaterally.    Review of Systems  Cardiovascular:  Positive for leg swelling.  Skin:  Positive for wound.  All other systems reviewed and are negative.      Objective:   Physical Exam Vitals reviewed.  HENT:     Head: Normocephalic.  Cardiovascular:     Rate and Rhythm: Normal rate.     Pulses:          Dorsalis pedis pulses are 1+ on the right side and 1+ on the left side.  Pulmonary:     Effort: Pulmonary effort is normal.  Skin:    General: Skin is warm and dry.     Comments: Bilateral ulcers  Neurological:     Mental Status: She is alert and oriented to person, place, and time.  Psychiatric:        Mood and Affect: Mood normal.        Behavior:  Behavior normal.        Thought Content: Thought content normal.        Judgment: Judgment normal.     BP (!) 184/74 (BP Location: Left Arm)   Pulse 61   Resp 15   Wt 103 lb (46.7 kg)   BMI 16.62 kg/m   Past Medical History:  Diagnosis Date   Atrial fibrillation (HCC)    Basal cell carcinoma 2021   L temple   Chronic back pain    Congestive heart failure (CHF) (HCC)    History of total hip replacement (Bilateral) 01/12/2020   Left hip replacement done in 2019.  Right hip replacement done in 2012.   Hypertension    Hyponatremia 06/11/2020   NSTEMI (non-ST elevated myocardial infarction) (HCC) 06/11/2020   Pharmacologic therapy 01/12/2020   Problems influencing health status 01/12/2020   Scoliosis     Social History   Socioeconomic History   Marital status: Single    Spouse name: Not on file   Number of children: 0   Years of education: Not on file   Highest education level: Not on file  Occupational History   Not on file  Tobacco Use   Smoking status: Former    Packs/day: 0.50    Years: 68.00  Additional pack years: 0.00    Total pack years: 34.00    Types: Cigarettes    Quit date: 06/11/2020    Years since quitting: 2.8   Smokeless tobacco: Never  Substance and Sexual Activity   Alcohol use: Not Currently   Drug use: Not Currently   Sexual activity: Not on file  Other Topics Concern   Not on file  Social History Narrative   Not on file   Social Determinants of Health   Financial Resource Strain: Not on file  Food Insecurity: Not on file  Transportation Needs: No Transportation Needs (12/25/2022)   PRAPARE - Administrator, Civil Service (Medical): No    Lack of Transportation (Non-Medical): No  Physical Activity: Insufficiently Active (12/25/2022)   Exercise Vital Sign    Days of Exercise per Week: 7 days    Minutes of Exercise per Session: 20 min  Stress: Stress Concern Present (12/25/2022)   [employer] of Occupational Health -  Occupational Stress Questionnaire    Feeling of Stress : Very much  Social Connections: Socially Isolated (12/25/2022)   Social Connection and Isolation Panel [NHANES]    Frequency of Communication with Friends and Family: Never    Frequency of Social Gatherings with Friends and Family: Never    Attends Religious Services: Never    Database administrator or Organizations: No    Attends Banker Meetings: Never    Marital Status: Never married  Intimate Partner Violence: Not At Risk (12/25/2022)   Humiliation, Afraid, Rape, and Kick questionnaire    Fear of Current or Ex-Partner: No    Emotionally Abused: No    Physically Abused: No    Sexually Abused: No    Past Surgical History:  Procedure Laterality Date   ABDOMINAL SURGERY     bilateral hip replacement     CORONARY STENT INTERVENTION N/A 06/12/2020   Procedure: CORONARY STENT INTERVENTION;  Surgeon: Iran Ouch, MD;  Location: [facility];  Service: Cardiovascular;  Laterality: N/A;  LAD   ESOPHAGOGASTRODUODENOSCOPY (EGD) WITH PROPOFOL N/A 02/24/2023   Procedure: ESOPHAGOGASTRODUODENOSCOPY (EGD) WITH PROPOFOL;  Surgeon: Midge Minium, MD;  Location: [facility];  Service: Endoscopy;  Laterality: N/A;   LEFT HEART CATH AND CORONARY ANGIOGRAPHY N/A 06/12/2020   Procedure: LEFT HEART CATH AND CORONARY ANGIOGRAPHY;  Surgeon: Iran Ouch, MD;  Location: [facility];  Service: Cardiovascular;  Laterality: N/A;   LOWER EXTREMITY ANGIOGRAPHY Right 02/25/2023   Procedure: Lower Extremity Angiography;  Surgeon: Renford Dills, MD;  Location: [facility];  Service: Cardiovascular;  Laterality: Right;   TONSILLECTOMY      Family History  Problem Relation Age of Onset   CVA Father    Heart disease Father    Arthritis/Rheumatoid Sister     Allergies  Allergen Reactions   Spironolactone Anaphylaxis   Fluogen [Influenza Virus Vaccine] Hives   Statins     Dizziness & weakness   Sulfa  Antibiotics Other (See Comments)    Unknown, happened as a child       Latest Ref Rng & Units 03/01/2023    4:53 AM 02/28/2023    4:38 AM 02/27/2023    4:39 AM  CBC  WBC 4.0 - 10.5 K/uL 7.7  9.0  9.6   Hemoglobin 12.0 - 15.0 g/dL 9.0  8.3  9.0   Hematocrit 36.0 - 46.0 % 30.2  27.1  28.8   Platelets 150 - 400 K/uL 279  285  296       CMP     Component Value Date/Time   NA 131 (L) 02/28/2023 0438   K 4.3 02/28/2023 0438   CL 100 02/28/2023 0438   CO2 25 02/28/2023 0438   GLUCOSE 88 02/28/2023 0438   BUN 29 (H) 02/28/2023 0438   CREATININE 0.60 02/28/2023 0438   CALCIUM 7.9 (L) 02/28/2023 0438   PROT 5.7 (L) 02/21/2023 0412   ALBUMIN 2.3 (L) 02/21/2023 0412   AST 25 02/21/2023 0412   ALT 27 02/21/2023 0412   ALKPHOS 117 02/21/2023 0412   BILITOT 0.7 02/21/2023 0412   GFRNONAA >60 02/28/2023 0438     VAS [place] ABI WITH/WO TBI  Result Date: 04/24/2023  LOWER EXTREMITY DOPPLER STUDY Patient Name:  [name] [name]  Date of Exam:   04/16/2023 Medical Rec #: [id number]      Accession #:    [id number] Date of Birth: 1935/02/17      Patient Gender: F Patient Age:   11 years Exam Location:  [facility] Procedure:      VAS [place] ABI WITH/WO TBI Referring Phys: --------------------------------------------------------------------------------  Indications: Ulceration.  Vascular Interventions: 02/25/23 Percutaneous transluminal angioplasty and stent                         placement right superficial femoral artery                         4. Percutaneous transluminal angioplasty and stent                         placement right external iliac artery. Performing Technologist: Salvadore Farber RVT  Examination Guidelines: A complete evaluation includes at minimum, Doppler waveform signals and systolic blood pressure reading at the level of bilateral brachial, anterior tibial, and posterior tibial arteries, when vessel segments are accessible. Bilateral testing is considered an integral part of a  complete examination. Photoelectric Plethysmograph (PPG) waveforms and toe systolic pressure readings are included as required and additional duplex testing as needed. Limited examinations for reoccurring indications may be performed as noted.  ABI Findings: +---------+------------------+-----+--------+--------+ Right    Rt Pressure (mmHg)IndexWaveformComment  +---------+------------------+-----+--------+--------+ Brachial 144                                     +---------+------------------+-----+--------+--------+ ATA      168               1.16 biphasic         +---------+------------------+-----+--------+--------+ PTA      160               1.10 biphasic         +---------+------------------+-----+--------+--------+ Great Toe151               1.04 Normal           +---------+------------------+-----+--------+--------+ +---------+------------------+-----+--------+-------+ Left     Lt Pressure (mmHg)IndexWaveformComment +---------+------------------+-----+--------+-------+ Brachial 145                                    +---------+------------------+-----+--------+-------+ ATA      155               1.07 biphasic        +---------+------------------+-----+--------+-------+  PTA      134               0.92 biphasic        +---------+------------------+-----+--------+-------+ Great Toe128               0.88 Normal          +---------+------------------+-----+--------+-------+  Summary: Right: Resting right ankle-brachial index is within normal range. The right toe-brachial index is normal. Left: Resting left ankle-brachial index is within normal range. The left toe-brachial index is normal. *See table(s) above for measurements and observations.  Electronically signed by [name] MD on 04/24/2023 at 8:48:24 AM.    Final        Assessment & Plan:   1. Open wound of lower leg, right, subsequent encounter The wound appears to be more so very shallow  venous ulcerations.  We will have the patient placed into Unna boots to be changed on a weekly basis to see if this helps with her wound healing.  Will reach out to her health agency for continued wraps.  Will have her return in 4 weeks.  2. Primary hypertension Continue antihypertensive medications as already ordered, these medications have been reviewed and there are no changes at this time.  3. Peripheral arterial disease with history of revascularization [facility]) Patient recently had noninvasive studies which indicate that she should have adequate perfusion for wound healing after recent intervention.   Current Outpatient Medications on File Prior to Visit  Medication Sig Dispense Refill   aspirin EC 81 MG tablet Take 1 tablet (81 mg total) by mouth daily. Swallow whole. 30 tablet 0   clopidogrel (PLAVIX) 75 MG tablet Take 1 tablet (75 mg total) by mouth daily with breakfast. 90 tablet 1   doxycycline (VIBRAMYCIN) 100 MG capsule Take 1 capsule (100 mg total) by mouth 2 (two) times daily. 20 capsule 0   feeding supplement (ENSURE ENLIVE / ENSURE PLUS) LIQD Take 237 mLs by mouth 2 (two) times daily between meals. 237 mL 12   gabapentin (NEURONTIN) 400 MG capsule Take 400 mg by mouth 4 (four) times daily.     lisinopril (ZESTRIL) 5 MG tablet Take 5 mg by mouth daily.     meloxicam (MOBIC) 15 MG tablet Take 15 mg by mouth daily.     metoprolol succinate (TOPROL-XL) 25 MG 24 hr tablet Take 25 mg by mouth daily.     oxyCODONE (OXY IR/ROXICODONE) 5 MG immediate release tablet Take 1 tablet (5 mg total) by mouth every 6 (six) hours as needed for severe pain. 10 tablet 0   terbinafine (LAMISIL) 250 MG tablet Take 1 tablet (250 mg total) by mouth daily. 42 tablet 1   No current facility-administered medications on file prior to visit.    There are no Patient Instructions on file for this visit. No follow-ups on file.   Georgiana Spinner, NP

## 2023-05-05 DIAGNOSIS — M6259 Muscle wasting and atrophy, not elsewhere classified, multiple sites: Secondary | ICD-10-CM | POA: Diagnosis not present

## 2023-05-05 DIAGNOSIS — L03115 Cellulitis of right lower limb: Secondary | ICD-10-CM | POA: Diagnosis not present

## 2023-05-05 DIAGNOSIS — E569 Vitamin deficiency, unspecified: Secondary | ICD-10-CM | POA: Diagnosis not present

## 2023-05-07 ENCOUNTER — Ambulatory Visit (INDEPENDENT_AMBULATORY_CARE_PROVIDER_SITE_OTHER): Payer: 59 | Admitting: Family

## 2023-05-07 VITALS — BP 120/80 | HR 80 | Ht 65.0 in | Wt 105.2 lb

## 2023-05-07 DIAGNOSIS — G8929 Other chronic pain: Secondary | ICD-10-CM | POA: Diagnosis not present

## 2023-05-07 DIAGNOSIS — M5137 Other intervertebral disc degeneration, lumbosacral region: Secondary | ICD-10-CM

## 2023-05-07 DIAGNOSIS — M5442 Lumbago with sciatica, left side: Secondary | ICD-10-CM | POA: Diagnosis not present

## 2023-05-07 DIAGNOSIS — M5441 Lumbago with sciatica, right side: Secondary | ICD-10-CM

## 2023-05-07 DIAGNOSIS — I10 Essential (primary) hypertension: Secondary | ICD-10-CM

## 2023-05-07 DIAGNOSIS — I25118 Atherosclerotic heart disease of native coronary artery with other forms of angina pectoris: Secondary | ICD-10-CM

## 2023-05-07 DIAGNOSIS — M4120 Other idiopathic scoliosis, site unspecified: Secondary | ICD-10-CM | POA: Diagnosis not present

## 2023-05-09 ENCOUNTER — Other Ambulatory Visit: Payer: Self-pay

## 2023-05-13 DIAGNOSIS — I739 Peripheral vascular disease, unspecified: Secondary | ICD-10-CM | POA: Diagnosis not present

## 2023-05-13 DIAGNOSIS — I5022 Chronic systolic (congestive) heart failure: Secondary | ICD-10-CM | POA: Diagnosis not present

## 2023-05-13 DIAGNOSIS — D649 Anemia, unspecified: Secondary | ICD-10-CM | POA: Diagnosis not present

## 2023-05-13 DIAGNOSIS — I4891 Unspecified atrial fibrillation: Secondary | ICD-10-CM | POA: Diagnosis not present

## 2023-05-13 DIAGNOSIS — I2510 Atherosclerotic heart disease of native coronary artery without angina pectoris: Secondary | ICD-10-CM | POA: Diagnosis not present

## 2023-05-13 DIAGNOSIS — Z556 Problems related to health literacy: Secondary | ICD-10-CM | POA: Diagnosis not present

## 2023-05-13 DIAGNOSIS — Z7902 Long term (current) use of antithrombotics/antiplatelets: Secondary | ICD-10-CM | POA: Diagnosis not present

## 2023-05-13 DIAGNOSIS — E569 Vitamin deficiency, unspecified: Secondary | ICD-10-CM | POA: Diagnosis not present

## 2023-05-13 DIAGNOSIS — E871 Hypo-osmolality and hyponatremia: Secondary | ICD-10-CM | POA: Diagnosis not present

## 2023-05-13 DIAGNOSIS — Z791 Long term (current) use of non-steroidal anti-inflammatories (NSAID): Secondary | ICD-10-CM | POA: Diagnosis not present

## 2023-05-13 DIAGNOSIS — L03115 Cellulitis of right lower limb: Secondary | ICD-10-CM | POA: Diagnosis not present

## 2023-05-13 DIAGNOSIS — Z7982 Long term (current) use of aspirin: Secondary | ICD-10-CM | POA: Diagnosis not present

## 2023-05-13 DIAGNOSIS — N179 Acute kidney failure, unspecified: Secondary | ICD-10-CM | POA: Diagnosis not present

## 2023-05-13 DIAGNOSIS — I110 Hypertensive heart disease with heart failure: Secondary | ICD-10-CM | POA: Diagnosis not present

## 2023-05-13 DIAGNOSIS — K922 Gastrointestinal hemorrhage, unspecified: Secondary | ICD-10-CM | POA: Diagnosis not present

## 2023-05-15 ENCOUNTER — Encounter: Payer: Self-pay | Admitting: Family

## 2023-05-15 NOTE — Assessment & Plan Note (Signed)
Patient stable.  Well controlled with current therapy.   Continue current meds.  

## 2023-05-15 NOTE — Progress Notes (Signed)
Established Patient Office Visit  Subjective:  Patient ID: [name] [name], female    DOB: [birth year]  Age: 88 y.o. MRN: [id number]  Chief Complaint  Patient presents with   Follow-up    1 mo F/U    Patient is here today for her 1 month follow up.  She has been feeling fairly well since last appointment.   She does not have additional concerns to discuss today.  Labs are not due today. She needs refills.   I have reviewed her active problem list, medication list, allergies, notes from last encounter, lab results for her appointment today.     No other concerns at this time.   Past Medical History:  Diagnosis Date   AKI (acute kidney injury) (HCC) 02/20/2023   Atrial fibrillation (HCC)    Basal cell carcinoma 2021   L temple   Chronic back pain    Congestive heart failure (CHF) (HCC)    History of total hip replacement (Bilateral) 01/12/2020   Left hip replacement done in 2019.  Right hip replacement done in 2012.   Hypertension    Hyponatremia 06/11/2020   NSTEMI (non-ST elevated myocardial infarction) (HCC) 06/11/2020   Pharmacologic therapy 01/12/2020   Problems influencing health status 01/12/2020   Scoliosis     Past Surgical History:  Procedure Laterality Date   ABDOMINAL SURGERY     bilateral hip replacement     CORONARY STENT INTERVENTION N/A 06/12/2020   Procedure: CORONARY STENT INTERVENTION;  Surgeon: Iran Ouch, MD;  Location: [facility];  Service: Cardiovascular;  Laterality: N/A;  LAD   ESOPHAGOGASTRODUODENOSCOPY (EGD) WITH PROPOFOL N/A 02/24/2023   Procedure: ESOPHAGOGASTRODUODENOSCOPY (EGD) WITH PROPOFOL;  Surgeon: Midge Minium, MD;  Location: [facility];  Service: Endoscopy;  Laterality: N/A;   LEFT HEART CATH AND CORONARY ANGIOGRAPHY N/A 06/12/2020   Procedure: LEFT HEART CATH AND CORONARY ANGIOGRAPHY;  Surgeon: Iran Ouch, MD;  Location: [facility];  Service: Cardiovascular;  Laterality: N/A;   LOWER EXTREMITY  ANGIOGRAPHY Right 02/25/2023   Procedure: Lower Extremity Angiography;  Surgeon: Renford Dills, MD;  Location: [facility];  Service: Cardiovascular;  Laterality: Right;   TONSILLECTOMY      Social History   Socioeconomic History   Marital status: Single    Spouse name: Not on file   Number of children: 0   Years of education: Not on file   Highest education level: Not on file  Occupational History   Not on file  Tobacco Use   Smoking status: Former    Packs/day: 0.50    Years: 68.00    Additional pack years: 0.00    Total pack years: 34.00    Types: Cigarettes    Quit date: 06/11/2020    Years since quitting: 2.9   Smokeless tobacco: Never  Substance and Sexual Activity   Alcohol use: Not Currently   Drug use: Not Currently   Sexual activity: Not on file  Other Topics Concern   Not on file  Social History Narrative   Not on file   Social Determinants of Health   Financial Resource Strain: Not on file  Food Insecurity: Not on file  Transportation Needs: No Transportation Needs (12/25/2022)   PRAPARE - Administrator, Civil Service (Medical): No    Lack of Transportation (Non-Medical): No  Physical Activity: Insufficiently Active (12/25/2022)   Exercise Vital Sign    Days of Exercise per Week: 7 days    Minutes  of Exercise per Session: 20 min  Stress: Stress Concern Present (12/25/2022)   [employer] of Occupational Health - Occupational Stress Questionnaire    Feeling of Stress : Very much  Social Connections: Socially Isolated (12/25/2022)   Social Connection and Isolation Panel [NHANES]    Frequency of Communication with Friends and Family: Never    Frequency of Social Gatherings with Friends and Family: Never    Attends Religious Services: Never    Database administrator or Organizations: No    Attends Banker Meetings: Never    Marital Status: Never married  Intimate Partner Violence: Not At Risk (12/25/2022)    Humiliation, Afraid, Rape, and Kick questionnaire    Fear of Current or Ex-Partner: No    Emotionally Abused: No    Physically Abused: No    Sexually Abused: No    Family History  Problem Relation Age of Onset   CVA Father    Heart disease Father    Arthritis/Rheumatoid Sister     Allergies  Allergen Reactions   Spironolactone Anaphylaxis   Fluogen [Influenza Virus Vaccine] Hives   Statins     Dizziness & weakness   Sulfa Antibiotics Other (See Comments)    Unknown, happened as a child    Review of Systems  Cardiovascular:  Positive for leg swelling.  Musculoskeletal:  Positive for back pain and joint pain.  All other systems reviewed and are negative.      Objective:   BP 120/80   Pulse 80   Ht 5\' 5"  (1.651 m)   Wt 105 lb 3.2 oz (47.7 kg)   SpO2 99%   BMI 17.51 kg/m   Vitals:   05/07/23 1026  BP: 120/80  Pulse: 80  Height: 5\' 5"  (1.651 m)  Weight: 105 lb 3.2 oz (47.7 kg)  SpO2: 99%  BMI (Calculated): 17.51    Physical Exam Vitals and nursing note reviewed.  Constitutional:      Appearance: Normal appearance. She is normal weight.  HENT:     Head: Normocephalic.  Eyes:     Pupils: Pupils are equal, round, and reactive to light.  Cardiovascular:     Rate and Rhythm: Normal rate.  Pulmonary:     Effort: Pulmonary effort is normal.  Musculoskeletal:     Thoracic back: Decreased range of motion. Scoliosis present.     Lumbar back: Decreased range of motion. Scoliosis present.  Neurological:     General: No focal deficit present.     Mental Status: She is alert and oriented to person, place, and time. Mental status is at baseline.  Psychiatric:        Attention and Perception: Attention and perception normal.        Mood and Affect: Mood normal.        Behavior: Behavior is agitated. Behavior is cooperative.        Thought Content: Thought content normal.        Judgment: Judgment normal.      No results found for any visits on  05/07/23.  Recent Results (from the past 2160 hour(s))  Basic metabolic panel     Status: Abnormal   Collection Time: 02/20/23  5:00 PM  Result Value Ref Range   Sodium 125 (L) 135 - 145 mmol/L   Potassium 4.4 3.5 - 5.1 mmol/L   Chloride 92 (L) 98 - 111 mmol/L   CO2 22 22 - 32 mmol/L   Glucose, Bld 125 (H) 70 - 99 mg/dL  Comment: Glucose reference range applies only to samples taken after fasting for at least 8 hours.   BUN 43 (H) 8 - 23 mg/dL   Creatinine, Ser 1.61 (H) 0.44 - 1.00 mg/dL   Calcium 8.3 (L) 8.9 - 10.3 mg/dL   GFR, Estimated 49 (L) >60 mL/min    Comment: (NOTE) Calculated using the CKD-EPI Creatinine Equation (2021)    Anion gap 11 5 - 15    Comment: Performed at [facility], [address] [place], [place] [address]  CBC     Status: Abnormal   Collection Time: 02/20/23  5:00 PM  Result Value Ref Range   WBC 9.1 4.0 - 10.5 K/uL   RBC 3.21 (L) 3.87 - 5.11 MIL/uL   Hemoglobin 8.5 (L) 12.0 - 15.0 g/dL   HCT 54.0 (L) 98.1 - 19.1 %   MCV 84.7 80.0 - 100.0 fL   MCH 26.5 26.0 - 34.0 pg   MCHC 31.3 30.0 - 36.0 g/dL   RDW 47.8 29.5 - 62.1 %   Platelets 608 (H) 150 - 400 K/uL   nRBC 0.0 0.0 - 0.2 %    Comment: Performed at [facility], [address] [place], [place] [address]  Troponin I (High Sensitivity)     Status: None   Collection Time: 02/20/23  5:00 PM  Result Value Ref Range   Troponin I (High Sensitivity) 12 <18 ng/L    Comment: (NOTE) Elevated high sensitivity troponin I (hsTnI) values and significant  changes across serial measurements may suggest ACS but many other  chronic and acute conditions are known to elevate hsTnI results.  Refer to the "Links" section for chest pain algorithms and additional  guidance. Performed at [facility], [address] [place], [place] [address]   Troponin I (High Sensitivity)     Status: None   Collection Time: 02/21/23  4:12 AM  Result Value Ref Range   Troponin I (High  Sensitivity) 16 <18 ng/L    Comment: (NOTE) Elevated high sensitivity troponin I (hsTnI) values and significant  changes across serial measurements may suggest ACS but many other  chronic and acute conditions are known to elevate hsTnI results.  Refer to the "Links" section for chest pain algorithms and additional  guidance. Performed at [facility], [address] [place], [place] [address]   Comprehensive metabolic panel     Status: Abnormal   Collection Time: 02/21/23  4:12 AM  Result Value Ref Range   Sodium 130 (L) 135 - 145 mmol/L   Potassium 4.4 3.5 - 5.1 mmol/L   Chloride 97 (L) 98 - 111 mmol/L   CO2 25 22 - 32 mmol/L   Glucose, Bld 100 (H) 70 - 99 mg/dL    Comment: Glucose reference range applies only to samples taken after fasting for at least 8 hours.   BUN 40 (H) 8 - 23 mg/dL   Creatinine, Ser 8.41 (H) 0.44 - 1.00 mg/dL   Calcium 7.9 (L) 8.9 - 10.3 mg/dL   Total Protein 5.7 (L) 6.5 - 8.1 g/dL   Albumin 2.3 (L) 3.5 - 5.0 g/dL   AST 25 15 - 41 U/L   ALT 27 0 - 44 U/L   Alkaline Phosphatase 117 38 - 126 U/L   Total Bilirubin 0.7 0.3 - 1.2 mg/dL   GFR, Estimated 54 (L) >60 mL/min    Comment: (NOTE) Calculated using the CKD-EPI Creatinine Equation (2021)    Anion gap 8 5 - 15  Comment: Performed at [facility], [address] [place], [place] [address]  CBC     Status: Abnormal   Collection Time: 02/21/23  4:12 AM  Result Value Ref Range   WBC 11.6 (H) 4.0 - 10.5 K/uL   RBC 2.80 (L) 3.87 - 5.11 MIL/uL   Hemoglobin 7.3 (L) 12.0 - 15.0 g/dL   HCT 19.1 (L) 47.8 - 29.5 %   MCV 84.6 80.0 - 100.0 fL   MCH 26.1 26.0 - 34.0 pg   MCHC 30.8 30.0 - 36.0 g/dL   RDW 62.1 30.8 - 65.7 %   Platelets 459 (H) 150 - 400 K/uL   nRBC 0.0 0.0 - 0.2 %    Comment: Performed at [facility], [address] [place], [place] [address]  Type and screen     Status: None   Collection Time: 02/21/23  4:12 AM  Result Value Ref Range   ABO/RH(D) O POS     Antibody Screen NEG    Sample Expiration 02/24/2023,2359    Unit Number [id number]    Blood Component Type RED CELLS,LR    Unit division 00    Status of Unit ISSUED,FINAL    Transfusion Status OK TO TRANSFUSE    Crossmatch Result      Compatible Performed at [facility], [address] [place], [place] [address]   BPAM RBC     Status: None   Collection Time: 02/21/23  4:12 AM  Result Value Ref Range   ISSUE DATE / TIME [id number]    Blood Product Unit Number [id number]    PRODUCT CODE [id number]    Unit Type and Rh 5100    Blood Product Expiration Date [id number]   Prepare RBC (crossmatch)     Status: None   Collection Time: 02/21/23 11:07 AM  Result Value Ref Range   Order Confirmation      ORDER PROCESSED BY BLOOD BANK Performed at [facility], [address] [place], [place] [address]   ABO/Rh     Status: None   Collection Time: 02/21/23 11:47 AM  Result Value Ref Range   ABO/RH(D)      O POS Performed at [facility], [address] [place], [place] [address]   CBC     Status: Abnormal   Collection Time: 02/22/23  5:36 AM  Result Value Ref Range   WBC 8.9 4.0 - 10.5 K/uL   RBC 3.60 (L) 3.87 - 5.11 MIL/uL   Hemoglobin 9.7 (L) 12.0 - 15.0 g/dL    Comment: REPEATED TO VERIFY   HCT 30.0 (L) 36.0 - 46.0 %   MCV 83.3 80.0 - 100.0 fL   MCH 26.9 26.0 - 34.0 pg   MCHC 32.3 30.0 - 36.0 g/dL   RDW 42.7 06.2 - 37.6 %   Platelets 428 (H) 150 - 400 K/uL   nRBC 0.0 0.0 - 0.2 %    Comment: Performed at [facility], [address] [place], [place] [address]  Basic metabolic panel     Status: Abnormal   Collection Time: 02/22/23  5:36 AM  Result Value Ref Range   Sodium 134 (L) 135 - 145 mmol/L   Potassium 3.4 (L) 3.5 - 5.1 mmol/L   Chloride 100 98 - 111 mmol/L   CO2 25 22 - 32 mmol/L   Glucose, Bld 100 (H) 70 - 99 mg/dL    Comment: Glucose reference range applies only to samples taken after fasting for at least 8  hours.    BUN 28 (H) 8 - 23 mg/dL   Creatinine, Ser 6.57 0.44 - 1.00 mg/dL   Calcium 8.0 (L) 8.9 - 10.3 mg/dL   GFR, Estimated >84 >69 mL/min    Comment: (NOTE) Calculated using the CKD-EPI Creatinine Equation (2021)    Anion gap 9 5 - 15    Comment: Performed at [facility], [address] [place], [place] [address]  Hemoglobin     Status: Abnormal   Collection Time: 02/23/23  7:40 AM  Result Value Ref Range   Hemoglobin 9.3 (L) 12.0 - 15.0 g/dL    Comment: Performed at [facility], [address], [place], [place] [address]  Basic metabolic panel     Status: Abnormal   Collection Time: 02/23/23  7:40 AM  Result Value Ref Range   Sodium 132 (L) 135 - 145 mmol/L   Potassium 3.5 3.5 - 5.1 mmol/L   Chloride 101 98 - 111 mmol/L   CO2 24 22 - 32 mmol/L   Glucose, Bld 138 (H) 70 - 99 mg/dL    Comment: Glucose reference range applies only to samples taken after fasting for at least 8 hours.   BUN 17 8 - 23 mg/dL   Creatinine, Ser 4.40 0.44 - 1.00 mg/dL   Calcium 8.0 (L) 8.9 - 10.3 mg/dL   GFR, Estimated >10 >27 mL/min    Comment: (NOTE) Calculated using the CKD-EPI Creatinine Equation (2021)    Anion gap 7 5 - 15    Comment: Performed at [facility], [address] [place], [place] [address]  Magnesium     Status: None   Collection Time: 02/23/23  7:40 AM  Result Value Ref Range   Magnesium 1.8 1.7 - 2.4 mg/dL    Comment: Performed at [facility], [address] [place], [place] [address]  Hemoglobin     Status: Abnormal   Collection Time: 02/24/23  8:34 AM  Result Value Ref Range   Hemoglobin 9.5 (L) 12.0 - 15.0 g/dL    Comment: Performed at [facility], [address] [place], [place] [address]  CBC     Status: Abnormal   Collection Time: 02/25/23  4:18 AM  Result Value Ref Range   WBC 8.1 4.0 - 10.5 K/uL   RBC 3.59 (L) 3.87 - 5.11 MIL/uL   Hemoglobin 9.5 (L) 12.0 - 15.0 g/dL   HCT 56.3 (L) 87.5 - 64.3 %   MCV 87.5 80.0  - 100.0 fL   MCH 26.5 26.0 - 34.0 pg   MCHC 30.3 30.0 - 36.0 g/dL   RDW 32.9 51.8 - 84.1 %   Platelets 340 150 - 400 K/uL   nRBC 0.0 0.0 - 0.2 %    Comment: Performed at [facility], [address] [place], [place] [address]  Basic metabolic panel     Status: Abnormal   Collection Time: 02/25/23  4:18 AM  Result Value Ref Range   Sodium 134 (L) 135 - 145 mmol/L   Potassium 3.4 (L) 3.5 - 5.1 mmol/L   Chloride 99 98 - 111 mmol/L   CO2 26 22 - 32 mmol/L   Glucose, Bld 99 70 - 99 mg/dL    Comment: Glucose reference range applies only to samples taken after fasting for at least 8 hours.   BUN 16 8 - 23 mg/dL   Creatinine, Ser 0.16 0.44 - 1.00 mg/dL   Calcium 7.8 (L) 8.9 - 10.3 mg/dL   GFR,  Estimated >60 >60 mL/min    Comment: (NOTE) Calculated using the CKD-EPI Creatinine Equation (2021)    Anion gap 9 5 - 15    Comment: Performed at [facility], [address] [place], [place] [address]  MRSA Next Gen by PCR, Nasal     Status: None   Collection Time: 02/25/23  6:06 AM   Specimen: Nasal Mucosa; Nasal Swab  Result Value Ref Range   MRSA by PCR Next Gen NOT DETECTED NOT DETECTED    Comment: (NOTE) The GeneXpert MRSA Assay (FDA approved for NASAL specimens only), is one component of a comprehensive MRSA colonization surveillance program. It is not intended to diagnose MRSA infection nor to guide or monitor treatment for MRSA infections. Test performance is not FDA approved in patients less than 56 years old. Performed at [facility], [address] [place], [place] [address]   CBC     Status: Abnormal   Collection Time: 02/26/23  4:33 AM  Result Value Ref Range   WBC 9.2 4.0 - 10.5 K/uL   RBC 3.47 (L) 3.87 - 5.11 MIL/uL   Hemoglobin 9.3 (L) 12.0 - 15.0 g/dL   HCT 09.8 (L) 11.9 - 14.7 %   MCV 85.6 80.0 - 100.0 fL   MCH 26.8 26.0 - 34.0 pg   MCHC 31.3 30.0 - 36.0 g/dL   RDW 82.9 56.2 - 13.0 %   Platelets 312 150 - 400 K/uL   nRBC 0.0 0.0 -  0.2 %    Comment: Performed at [facility], [address], [place], [place] [address]  Basic metabolic panel     Status: Abnormal   Collection Time: 02/26/23  4:33 AM  Result Value Ref Range   Sodium 130 (L) 135 - 145 mmol/L   Potassium 3.5 3.5 - 5.1 mmol/L   Chloride 101 98 - 111 mmol/L   CO2 24 22 - 32 mmol/L   Glucose, Bld 93 70 - 99 mg/dL    Comment: Glucose reference range applies only to samples taken after fasting for at least 8 hours.   BUN 16 8 - 23 mg/dL   Creatinine, Ser 4.69 0.44 - 1.00 mg/dL   Calcium 7.8 (L) 8.9 - 10.3 mg/dL   GFR, Estimated >62 >95 mL/min    Comment: (NOTE) Calculated using the CKD-EPI Creatinine Equation (2021)    Anion gap 5 5 - 15    Comment: Performed at [facility], [address] [place], [place] [address]  Ferritin     Status: None   Collection Time: 02/26/23  4:33 AM  Result Value Ref Range   Ferritin 287 11 - 307 ng/mL    Comment: Performed at [facility], [address] [place], [place] [address]  CBC     Status: Abnormal   Collection Time: 02/27/23  4:39 AM  Result Value Ref Range   WBC 9.6 4.0 - 10.5 K/uL   RBC 3.36 (L) 3.87 - 5.11 MIL/uL   Hemoglobin 9.0 (L) 12.0 - 15.0 g/dL   HCT 02.7 (L) 25.3 - 66.4 %   MCV 85.7 80.0 - 100.0 fL   MCH 26.8 26.0 - 34.0 pg   MCHC 31.3 30.0 - 36.0 g/dL   RDW 40.3 47.4 - 25.9 %   Platelets 296 150 - 400 K/uL   nRBC 0.0 0.0 - 0.2 %    Comment: Performed at [facility], [address], [place], [place] [address]  Basic metabolic panel  Status: Abnormal   Collection Time: 02/28/23  4:38 AM  Result Value Ref Range   Sodium 131 (L) 135 - 145 mmol/L   Potassium 4.3 3.5 - 5.1 mmol/L   Chloride 100 98 - 111 mmol/L   CO2 25 22 - 32 mmol/L   Glucose, Bld 88 70 - 99 mg/dL    Comment: Glucose reference range applies only to samples taken after fasting for at least 8 hours.   BUN 29 (H) 8 - 23 mg/dL   Creatinine, Ser 1.61 0.44 - 1.00 mg/dL   Calcium 7.9  (L) 8.9 - 10.3 mg/dL   GFR, Estimated >09 >60 mL/min    Comment: (NOTE) Calculated using the CKD-EPI Creatinine Equation (2021)    Anion gap 6 5 - 15    Comment: Performed at [facility], [address] [place], [place] [address]  CBC     Status: Abnormal   Collection Time: 02/28/23  4:38 AM  Result Value Ref Range   WBC 9.0 4.0 - 10.5 K/uL   RBC 3.13 (L) 3.87 - 5.11 MIL/uL   Hemoglobin 8.3 (L) 12.0 - 15.0 g/dL   HCT 81.1 (L) 91.4 - 78.2 %   MCV 86.6 80.0 - 100.0 fL   MCH 26.5 26.0 - 34.0 pg   MCHC 30.6 30.0 - 36.0 g/dL   RDW 95.6 21.3 - 08.6 %   Platelets 285 150 - 400 K/uL   nRBC 0.0 0.0 - 0.2 %    Comment: Performed at [facility], [address] [place], [place] [address]  CBC     Status: Abnormal   Collection Time: 03/01/23  4:53 AM  Result Value Ref Range   WBC 7.7 4.0 - 10.5 K/uL   RBC 3.37 (L) 3.87 - 5.11 MIL/uL   Hemoglobin 9.0 (L) 12.0 - 15.0 g/dL   HCT 96.2 (L) 95.2 - 84.1 %   MCV 89.6 80.0 - 100.0 fL   MCH 26.7 26.0 - 34.0 pg   MCHC 29.8 (L) 30.0 - 36.0 g/dL   RDW 32.4 40.1 - 02.7 %   Platelets 279 150 - 400 K/uL   nRBC 0.0 0.0 - 0.2 %    Comment: Performed at [facility], [address] [place], [place] [address]  Aerobic culture     Status: Abnormal   Collection Time: 04/16/23 10:40 AM  Result Value Ref Range   Aerobic Bacterial Culture Final report (A)    Organism ID, Bacteria Comment (A)     Comment: Methicillin - resistant Staphylococcus aureus Based on resistance to oxacillin this isolate would be resistant to all currently available beta-lactam antimicrobial agents, with the exception of the newer cephalosporins with anti-MRSA activity, such as Ceftaroline Heavy growth    Organism ID, Bacteria Comment (A)     Comment: Enterobacter cloacae complex Moderate growth    Organism ID, Bacteria Routine flora     Comment: Heavy growth   Antimicrobial Susceptibility Comment     Comment:       ** S = Susceptible; I =  Intermediate; R = Resistant **                    P = Positive; N = Negative             MICS are expressed in micrograms per mL    Antibiotic                 RSLT#1    RSLT#2  RSLT#3    RSLT#4 Amoxicillin/Clavulanic Acid              R Cefazolin                                R Cefepime                                 S Cefuroxime                               I Ciprofloxacin                  R         S Clindamycin                    S Erythromycin                   S Gentamicin                     S         S Imipenem                                 S Levofloxacin                   I         S Linezolid                      S Oxacillin                      R Penicillin                     R Rifampin                       S Tetracycline                   S         S Tobramycin                               S Trimethoprim/Sulfa             S         S Vancomycin                     S        Assessment & Plan:   Problem List Items Addressed This Visit       Active Problems   Chronic low back pain (1ry area of Pain) (Bilateral) (R>L) w/ sciatica (Bilateral) - Primary (Chronic)    Patient stable.  Well controlled with current therapy.   Continue current meds.        Idiopathic scoliosis and kyphoscoliosis    Patient stable.  Well controlled with current therapy.   Continue current meds.        DDD (degenerative disc disease), lumbosacral (Chronic)    Patient stable.  Well controlled with current therapy.   Continue current meds.  Hypertension    Blood pressure well controlled with current medications.  Continue current therapy.  Will reassess at follow up.       Coronary artery disease of native artery of native heart with stable angina pectoris [facility])    Patient is seen by Cardiology, who manage this condition.  She is well controlled with current therapy.   Will defer to them for further changes to plan of care.        Return in about 1  month (around 06/06/2023).   Total time spent: 30 minutes  Miki Kins, FNP  05/07/2023   This document may have been prepared by [facility] Voice Recognition software and as such may include unintentional dictation errors.

## 2023-05-15 NOTE — Assessment & Plan Note (Signed)
Patient is seen by Cardiology, who manage this condition.  She is well controlled with current therapy.   Will defer to them for further changes to plan of care.

## 2023-05-15 NOTE — Assessment & Plan Note (Signed)
Blood pressure well controlled with current medications.  Continue current therapy.  Will reassess at follow up.  

## 2023-05-19 ENCOUNTER — Telehealth: Payer: Self-pay | Admitting: Family

## 2023-05-19 ENCOUNTER — Other Ambulatory Visit: Payer: Self-pay | Admitting: Family

## 2023-05-19 DIAGNOSIS — I4891 Unspecified atrial fibrillation: Secondary | ICD-10-CM

## 2023-05-19 MED ORDER — LISINOPRIL 5 MG PO TABS: 5.0000 mg | ORAL_TABLET | Freq: Every day | ORAL | 3 refills | Status: DC

## 2023-05-19 NOTE — Telephone Encounter (Signed)
Patient called and reports that she's had a sore throat and cough for two weeks. States the cough is keeping her up at night and is constant. Could you send in something for cough?  [facility] [employer]

## 2023-05-20 DIAGNOSIS — Z7982 Long term (current) use of aspirin: Secondary | ICD-10-CM | POA: Diagnosis not present

## 2023-05-20 DIAGNOSIS — L03115 Cellulitis of right lower limb: Secondary | ICD-10-CM | POA: Diagnosis not present

## 2023-05-20 DIAGNOSIS — I110 Hypertensive heart disease with heart failure: Secondary | ICD-10-CM | POA: Diagnosis not present

## 2023-05-20 DIAGNOSIS — I2510 Atherosclerotic heart disease of native coronary artery without angina pectoris: Secondary | ICD-10-CM | POA: Diagnosis not present

## 2023-05-20 DIAGNOSIS — E569 Vitamin deficiency, unspecified: Secondary | ICD-10-CM | POA: Diagnosis not present

## 2023-05-20 DIAGNOSIS — I4891 Unspecified atrial fibrillation: Secondary | ICD-10-CM | POA: Diagnosis not present

## 2023-05-20 DIAGNOSIS — N179 Acute kidney failure, unspecified: Secondary | ICD-10-CM | POA: Diagnosis not present

## 2023-05-20 DIAGNOSIS — Z791 Long term (current) use of non-steroidal anti-inflammatories (NSAID): Secondary | ICD-10-CM | POA: Diagnosis not present

## 2023-05-20 DIAGNOSIS — Z556 Problems related to health literacy: Secondary | ICD-10-CM | POA: Diagnosis not present

## 2023-05-20 DIAGNOSIS — D649 Anemia, unspecified: Secondary | ICD-10-CM | POA: Diagnosis not present

## 2023-05-20 DIAGNOSIS — I739 Peripheral vascular disease, unspecified: Secondary | ICD-10-CM | POA: Diagnosis not present

## 2023-05-20 DIAGNOSIS — E871 Hypo-osmolality and hyponatremia: Secondary | ICD-10-CM | POA: Diagnosis not present

## 2023-05-20 DIAGNOSIS — I5022 Chronic systolic (congestive) heart failure: Secondary | ICD-10-CM | POA: Diagnosis not present

## 2023-05-20 DIAGNOSIS — K922 Gastrointestinal hemorrhage, unspecified: Secondary | ICD-10-CM | POA: Diagnosis not present

## 2023-05-20 DIAGNOSIS — Z7902 Long term (current) use of antithrombotics/antiplatelets: Secondary | ICD-10-CM | POA: Diagnosis not present

## 2023-05-23 ENCOUNTER — Other Ambulatory Visit: Payer: Self-pay

## 2023-05-23 MED ORDER — BENZONATATE 100 MG PO CAPS: 100.0000 mg | ORAL_CAPSULE | Freq: Three times a day (TID) | ORAL | 0 refills | Status: DC

## 2023-05-23 NOTE — Telephone Encounter (Signed)
Sending in tessalon perles

## 2023-05-27 ENCOUNTER — Telehealth: Payer: Self-pay | Admitting: Family

## 2023-05-27 NOTE — Telephone Encounter (Signed)
Patient left VM that she has "a blockage in her ear".

## 2023-05-28 DIAGNOSIS — E871 Hypo-osmolality and hyponatremia: Secondary | ICD-10-CM | POA: Diagnosis not present

## 2023-05-28 DIAGNOSIS — D649 Anemia, unspecified: Secondary | ICD-10-CM | POA: Diagnosis not present

## 2023-05-28 DIAGNOSIS — K922 Gastrointestinal hemorrhage, unspecified: Secondary | ICD-10-CM | POA: Diagnosis not present

## 2023-05-28 DIAGNOSIS — I5022 Chronic systolic (congestive) heart failure: Secondary | ICD-10-CM | POA: Diagnosis not present

## 2023-05-28 DIAGNOSIS — Z556 Problems related to health literacy: Secondary | ICD-10-CM | POA: Diagnosis not present

## 2023-05-28 DIAGNOSIS — I739 Peripheral vascular disease, unspecified: Secondary | ICD-10-CM | POA: Diagnosis not present

## 2023-05-28 DIAGNOSIS — E569 Vitamin deficiency, unspecified: Secondary | ICD-10-CM | POA: Diagnosis not present

## 2023-05-28 DIAGNOSIS — Z791 Long term (current) use of non-steroidal anti-inflammatories (NSAID): Secondary | ICD-10-CM | POA: Diagnosis not present

## 2023-05-28 DIAGNOSIS — I110 Hypertensive heart disease with heart failure: Secondary | ICD-10-CM | POA: Diagnosis not present

## 2023-05-28 DIAGNOSIS — Z7902 Long term (current) use of antithrombotics/antiplatelets: Secondary | ICD-10-CM | POA: Diagnosis not present

## 2023-05-28 DIAGNOSIS — I2510 Atherosclerotic heart disease of native coronary artery without angina pectoris: Secondary | ICD-10-CM | POA: Diagnosis not present

## 2023-05-28 DIAGNOSIS — L03115 Cellulitis of right lower limb: Secondary | ICD-10-CM | POA: Diagnosis not present

## 2023-05-28 DIAGNOSIS — Z7982 Long term (current) use of aspirin: Secondary | ICD-10-CM | POA: Diagnosis not present

## 2023-05-28 DIAGNOSIS — I4891 Unspecified atrial fibrillation: Secondary | ICD-10-CM | POA: Diagnosis not present

## 2023-05-28 DIAGNOSIS — N179 Acute kidney failure, unspecified: Secondary | ICD-10-CM | POA: Diagnosis not present

## 2023-05-29 ENCOUNTER — Encounter (INDEPENDENT_AMBULATORY_CARE_PROVIDER_SITE_OTHER): Payer: Self-pay | Admitting: Nurse Practitioner

## 2023-05-29 ENCOUNTER — Ambulatory Visit (INDEPENDENT_AMBULATORY_CARE_PROVIDER_SITE_OTHER): Payer: 59 | Admitting: Nurse Practitioner

## 2023-05-29 VITALS — BP 190/70 | HR 74 | Resp 18 | Wt 104.6 lb

## 2023-05-29 DIAGNOSIS — S81801D Unspecified open wound, right lower leg, subsequent encounter: Secondary | ICD-10-CM

## 2023-05-29 DIAGNOSIS — I10 Essential (primary) hypertension: Secondary | ICD-10-CM | POA: Diagnosis not present

## 2023-05-29 DIAGNOSIS — I739 Peripheral vascular disease, unspecified: Secondary | ICD-10-CM

## 2023-05-29 DIAGNOSIS — Z9889 Other specified postprocedural states: Secondary | ICD-10-CM | POA: Diagnosis not present

## 2023-05-29 NOTE — Progress Notes (Signed)
Subjective:    Patient ID: [name] [name], female    DOB: 23-Jun-1935, 88 y.o.   MRN: [id number] Chief Complaint  Patient presents with   Follow-up    4 week unna check    [name] [name] is an 88 year old female who returns today for follow-up studies in order to evaluate for possible chronic venous insufficiency.  She recently underwent intervention on her right lower extremity due to slow healing wounds.  Despite this she had normal ABIs at her follow-up but she still continues to have wounds on her bilateral lower extremities.  These wounds are very shallow but they are uncomfortable for the patient and they have been slow in healing.  She notes that she develops these wounds and then they heal and then shortly after healing she seems to develop them again.  There was some concern for infection the patient had a culture done recently and we will send in antibiotics and the wounds have improved since that time.  The patient's family member notes that she is trying to move into an assisted living facility but she cannot while she continues to have these open wounds.  She has been in [facility] for 4 weeks.  There is improvement on the wounds on both lower extremities.  The wound on the left ankle area is nearly healed whereas the wound on the right is much more shallow with evidence of granulation    Review of Systems  Cardiovascular:  Positive for leg swelling.  Musculoskeletal:  Positive for arthralgias and gait problem.  Skin:  Positive for wound.  All other systems reviewed and are negative.      Objective:   Physical Exam Vitals reviewed.  HENT:     Head: Normocephalic.  Cardiovascular:     Rate and Rhythm: Normal rate.     Pulses:          Dorsalis pedis pulses are 1+ on the right side and 1+ on the left side.  Pulmonary:     Effort: Pulmonary effort is normal.  Skin:    General: Skin is warm and dry.     Comments: Bilateral ulcers  Neurological:     Mental Status: She is  alert and oriented to person, place, and time.  Psychiatric:        Mood and Affect: Mood normal.        Behavior: Behavior normal.        Thought Content: Thought content normal.        Judgment: Judgment normal.     BP (!) 190/70 (BP Location: Right Arm)   Pulse 74   Resp 18   Wt 104 lb 9.6 oz (47.4 kg)   BMI 17.41 kg/m   Past Medical History:  Diagnosis Date   AKI (acute kidney injury) (HCC) 02/20/2023   Atrial fibrillation (HCC)    Basal cell carcinoma 2021   L temple   Chronic back pain    Congestive heart failure (CHF) (HCC)    History of total hip replacement (Bilateral) 01/12/2020   Left hip replacement done in 2019.  Right hip replacement done in 2012.   Hypertension    Hyponatremia 06/11/2020   NSTEMI (non-ST elevated myocardial infarction) (HCC) 06/11/2020   Pharmacologic therapy 01/12/2020   Problems influencing health status 01/12/2020   Scoliosis     Social History   Socioeconomic History   Marital status: Single    Spouse name: Not on file   Number of children: 0   Years  of education: Not on file   Highest education level: Not on file  Occupational History   Not on file  Tobacco Use   Smoking status: Former    Current packs/day: 0.00    Average packs/day: 0.5 packs/day for 68.0 years (34.0 ttl pk-yrs)    Types: Cigarettes    Start date: 06/11/1952    Quit date: 06/11/2020    Years since quitting: 2.9   Smokeless tobacco: Never  Substance and Sexual Activity   Alcohol use: Not Currently   Drug use: Not Currently   Sexual activity: Not on file  Other Topics Concern   Not on file  Social History Narrative   Not on file   Social Determinants of Health   Financial Resource Strain: Not on file  Food Insecurity: Not on file  Transportation Needs: No Transportation Needs (12/25/2022)   PRAPARE - Administrator, Civil Service (Medical): No    Lack of Transportation (Non-Medical): No  Physical Activity: Insufficiently Active (12/25/2022)    Exercise Vital Sign    Days of Exercise per Week: 7 days    Minutes of Exercise per Session: 20 min  Stress: Stress Concern Present (12/25/2022)   [employer] of Occupational Health - Occupational Stress Questionnaire    Feeling of Stress : Very much  Social Connections: Socially Isolated (12/25/2022)   Social Connection and Isolation Panel [NHANES]    Frequency of Communication with Friends and Family: Never    Frequency of Social Gatherings with Friends and Family: Never    Attends Religious Services: Never    Database administrator or Organizations: No    Attends Banker Meetings: Never    Marital Status: Never married  Intimate Partner Violence: Not At Risk (12/25/2022)   Humiliation, Afraid, Rape, and Kick questionnaire    Fear of Current or Ex-Partner: No    Emotionally Abused: No    Physically Abused: No    Sexually Abused: No    Past Surgical History:  Procedure Laterality Date   ABDOMINAL SURGERY     bilateral hip replacement     CORONARY STENT INTERVENTION N/A 06/12/2020   Procedure: CORONARY STENT INTERVENTION;  Surgeon: Iran Ouch, MD;  Location: [facility];  Service: Cardiovascular;  Laterality: N/A;  LAD   ESOPHAGOGASTRODUODENOSCOPY (EGD) WITH PROPOFOL N/A 02/24/2023   Procedure: ESOPHAGOGASTRODUODENOSCOPY (EGD) WITH PROPOFOL;  Surgeon: Midge Minium, MD;  Location: [facility];  Service: Endoscopy;  Laterality: N/A;   LEFT HEART CATH AND CORONARY ANGIOGRAPHY N/A 06/12/2020   Procedure: LEFT HEART CATH AND CORONARY ANGIOGRAPHY;  Surgeon: Iran Ouch, MD;  Location: [facility];  Service: Cardiovascular;  Laterality: N/A;   LOWER EXTREMITY ANGIOGRAPHY Right 02/25/2023   Procedure: Lower Extremity Angiography;  Surgeon: Renford Dills, MD;  Location: [facility];  Service: Cardiovascular;  Laterality: Right;   TONSILLECTOMY      Family History  Problem Relation Age of Onset   CVA Father    Heart disease  Father    Arthritis/Rheumatoid Sister     Allergies  Allergen Reactions   Spironolactone Anaphylaxis   Fluogen [Influenza Virus Vaccine] Hives   Statins     Dizziness & weakness   Sulfa Antibiotics Other (See Comments)    Unknown, happened as a child       Latest Ref Rng & Units 03/01/2023    4:53 AM 02/28/2023    4:38 AM 02/27/2023    4:39 AM  CBC  WBC 4.0 - 10.5 K/uL 7.7  9.0  9.6   Hemoglobin 12.0 - 15.0 g/dL 9.0  8.3  9.0   Hematocrit 36.0 - 46.0 % 30.2  27.1  28.8   Platelets 150 - 400 K/uL 279  285  296       CMP     Component Value Date/Time   NA 131 (L) 02/28/2023 0438   K 4.3 02/28/2023 0438   CL 100 02/28/2023 0438   CO2 25 02/28/2023 0438   GLUCOSE 88 02/28/2023 0438   BUN 29 (H) 02/28/2023 0438   CREATININE 0.60 02/28/2023 0438   CALCIUM 7.9 (L) 02/28/2023 0438   PROT 5.7 (L) 02/21/2023 0412   ALBUMIN 2.3 (L) 02/21/2023 0412   AST 25 02/21/2023 0412   ALT 27 02/21/2023 0412   ALKPHOS 117 02/21/2023 0412   BILITOT 0.7 02/21/2023 0412   GFRNONAA >60 02/28/2023 0438     VAS [place] ABI WITH/WO TBI  Result Date: 04/24/2023  LOWER EXTREMITY DOPPLER STUDY Patient Name:  Sahaana Weitman  Date of Exam:   04/16/2023 Medical Rec #: [id number]      Accession #:    [id number] Date of Birth: [birth year]      Patient Gender: F Patient Age:   89 years Exam Location:  [facility] Procedure:      VAS [place] ABI WITH/WO TBI Referring Phys: --------------------------------------------------------------------------------  Indications: Ulceration.  Vascular Interventions: 02/25/23 Percutaneous transluminal angioplasty and stent                         placement right superficial femoral artery                         4. Percutaneous transluminal angioplasty and stent                         placement right external iliac artery. Performing Technologist: Salvadore Farber RVT  Examination Guidelines: A complete evaluation includes at minimum, Doppler waveform signals and systolic blood  pressure reading at the level of bilateral brachial, anterior tibial, and posterior tibial arteries, when vessel segments are accessible. Bilateral testing is considered an integral part of a complete examination. Photoelectric Plethysmograph (PPG) waveforms and toe systolic pressure readings are included as required and additional duplex testing as needed. Limited examinations for reoccurring indications may be performed as noted.  ABI Findings: +---------+------------------+-----+--------+--------+ Right    Rt Pressure (mmHg)IndexWaveformComment  +---------+------------------+-----+--------+--------+ Brachial 144                                     +---------+------------------+-----+--------+--------+ ATA      168               1.16 biphasic         +---------+------------------+-----+--------+--------+ PTA      160               1.10 biphasic         +---------+------------------+-----+--------+--------+ Great Toe151               1.04 Normal           +---------+------------------+-----+--------+--------+ +---------+------------------+-----+--------+-------+ Left     Lt Pressure (mmHg)IndexWaveformComment +---------+------------------+-----+--------+-------+ Brachial 145                                    +---------+------------------+-----+--------+-------+  ATA      155               1.07 biphasic        +---------+------------------+-----+--------+-------+ PTA      134               0.92 biphasic        +---------+------------------+-----+--------+-------+ Great Toe128               0.88 Normal          +---------+------------------+-----+--------+-------+  Summary: Right: Resting right ankle-brachial index is within normal range. The right toe-brachial index is normal. Left: Resting left ankle-brachial index is within normal range. The left toe-brachial index is normal. *See table(s) above for measurements and observations.  Electronically signed by  [name] MD on 04/24/2023 at 8:48:24 AM.    Final        Assessment & Plan:   1. Open wound of lower leg, right, subsequent encounter The patient has been tolerating the Unna boots well.  They have been showing some improvement in her wounds.  Based on this we will continue to do Unna boots.  She will continue to have these done by home health.  Will have the patient return in 4 weeks for reevaluation.  Will have her return in 4 weeks.  2. Primary hypertension Continue antihypertensive medications as already ordered, these medications have been reviewed and there are no changes at this time.  3. Peripheral arterial disease with history of revascularization [facility]) Patient recently had noninvasive studies which indicate that she should have adequate perfusion for wound healing after recent intervention.   Current Outpatient Medications on File Prior to Visit  Medication Sig Dispense Refill   aspirin EC 81 MG tablet Take 1 tablet (81 mg total) by mouth daily. Swallow whole. 30 tablet 0   benzonatate (TESSALON PERLES) 100 MG capsule Take 1 capsule (100 mg total) by mouth 3 (three) times daily. 30 capsule 0   clopidogrel (PLAVIX) 75 MG tablet Take 1 tablet (75 mg total) by mouth daily with breakfast. 90 tablet 1   doxycycline (VIBRAMYCIN) 100 MG capsule Take 1 capsule (100 mg total) by mouth 2 (two) times daily. 20 capsule 0   feeding supplement (ENSURE ENLIVE / ENSURE PLUS) LIQD Take 237 mLs by mouth 2 (two) times daily between meals. 237 mL 12   gabapentin (NEURONTIN) 400 MG capsule Take 400 mg by mouth 4 (four) times daily.     lisinopril (ZESTRIL) 5 MG tablet Take 1 tablet (5 mg total) by mouth daily. 90 tablet 3   meloxicam (MOBIC) 15 MG tablet Take 15 mg by mouth daily.     metoprolol succinate (TOPROL-XL) 25 MG 24 hr tablet TAKE 1 TABLET BY MOUTH EVERY DAY 90 tablet 1   oxyCODONE (OXY IR/ROXICODONE) 5 MG immediate release tablet Take 1 tablet (5 mg total) by mouth every 6 (six)  hours as needed for severe pain. 10 tablet 0   No current facility-administered medications on file prior to visit.    There are no Patient Instructions on file for this visit. No follow-ups on file.   Georgiana Spinner, NP

## 2023-06-02 ENCOUNTER — Other Ambulatory Visit: Payer: Self-pay

## 2023-06-03 ENCOUNTER — Other Ambulatory Visit: Payer: Self-pay | Admitting: Family

## 2023-06-03 MED ORDER — OXYCODONE-ACETAMINOPHEN 10-325 MG PO TABS: 1.0000 | ORAL_TABLET | Freq: Four times a day (QID) | ORAL | 0 refills | Status: DC | PRN

## 2023-06-04 DIAGNOSIS — I5022 Chronic systolic (congestive) heart failure: Secondary | ICD-10-CM | POA: Diagnosis not present

## 2023-06-04 DIAGNOSIS — L03115 Cellulitis of right lower limb: Secondary | ICD-10-CM | POA: Diagnosis not present

## 2023-06-04 DIAGNOSIS — Z556 Problems related to health literacy: Secondary | ICD-10-CM | POA: Diagnosis not present

## 2023-06-04 DIAGNOSIS — K922 Gastrointestinal hemorrhage, unspecified: Secondary | ICD-10-CM | POA: Diagnosis not present

## 2023-06-04 DIAGNOSIS — I739 Peripheral vascular disease, unspecified: Secondary | ICD-10-CM | POA: Diagnosis not present

## 2023-06-04 DIAGNOSIS — E871 Hypo-osmolality and hyponatremia: Secondary | ICD-10-CM | POA: Diagnosis not present

## 2023-06-04 DIAGNOSIS — I110 Hypertensive heart disease with heart failure: Secondary | ICD-10-CM | POA: Diagnosis not present

## 2023-06-04 DIAGNOSIS — I4891 Unspecified atrial fibrillation: Secondary | ICD-10-CM | POA: Diagnosis not present

## 2023-06-04 DIAGNOSIS — E569 Vitamin deficiency, unspecified: Secondary | ICD-10-CM | POA: Diagnosis not present

## 2023-06-04 DIAGNOSIS — N179 Acute kidney failure, unspecified: Secondary | ICD-10-CM | POA: Diagnosis not present

## 2023-06-04 DIAGNOSIS — Z7902 Long term (current) use of antithrombotics/antiplatelets: Secondary | ICD-10-CM | POA: Diagnosis not present

## 2023-06-04 DIAGNOSIS — Z7982 Long term (current) use of aspirin: Secondary | ICD-10-CM | POA: Diagnosis not present

## 2023-06-04 DIAGNOSIS — Z791 Long term (current) use of non-steroidal anti-inflammatories (NSAID): Secondary | ICD-10-CM | POA: Diagnosis not present

## 2023-06-04 DIAGNOSIS — D649 Anemia, unspecified: Secondary | ICD-10-CM | POA: Diagnosis not present

## 2023-06-04 DIAGNOSIS — I2510 Atherosclerotic heart disease of native coronary artery without angina pectoris: Secondary | ICD-10-CM | POA: Diagnosis not present

## 2023-06-10 NOTE — Telephone Encounter (Signed)
Will discuss at her follow up appt per [facility]

## 2023-06-11 ENCOUNTER — Ambulatory Visit (INDEPENDENT_AMBULATORY_CARE_PROVIDER_SITE_OTHER): Payer: 59 | Admitting: Family

## 2023-06-11 VITALS — BP 130/89 | HR 78 | Ht 65.0 in | Wt 101.8 lb

## 2023-06-11 DIAGNOSIS — M5137 Other intervertebral disc degeneration, lumbosacral region: Secondary | ICD-10-CM

## 2023-06-11 DIAGNOSIS — I10 Essential (primary) hypertension: Secondary | ICD-10-CM | POA: Diagnosis not present

## 2023-06-11 DIAGNOSIS — S81801S Unspecified open wound, right lower leg, sequela: Secondary | ICD-10-CM | POA: Diagnosis not present

## 2023-06-11 DIAGNOSIS — E569 Vitamin deficiency, unspecified: Secondary | ICD-10-CM | POA: Diagnosis not present

## 2023-06-11 DIAGNOSIS — Z7982 Long term (current) use of aspirin: Secondary | ICD-10-CM | POA: Diagnosis not present

## 2023-06-11 DIAGNOSIS — I739 Peripheral vascular disease, unspecified: Secondary | ICD-10-CM | POA: Diagnosis not present

## 2023-06-11 DIAGNOSIS — I482 Chronic atrial fibrillation, unspecified: Secondary | ICD-10-CM | POA: Diagnosis not present

## 2023-06-11 DIAGNOSIS — M79605 Pain in left leg: Secondary | ICD-10-CM

## 2023-06-11 DIAGNOSIS — N179 Acute kidney failure, unspecified: Secondary | ICD-10-CM | POA: Diagnosis not present

## 2023-06-11 DIAGNOSIS — D649 Anemia, unspecified: Secondary | ICD-10-CM | POA: Diagnosis not present

## 2023-06-11 DIAGNOSIS — I2510 Atherosclerotic heart disease of native coronary artery without angina pectoris: Secondary | ICD-10-CM | POA: Diagnosis not present

## 2023-06-11 DIAGNOSIS — M4120 Other idiopathic scoliosis, site unspecified: Secondary | ICD-10-CM | POA: Diagnosis not present

## 2023-06-11 DIAGNOSIS — I110 Hypertensive heart disease with heart failure: Secondary | ICD-10-CM | POA: Diagnosis not present

## 2023-06-11 DIAGNOSIS — E43 Unspecified severe protein-calorie malnutrition: Secondary | ICD-10-CM | POA: Diagnosis not present

## 2023-06-11 DIAGNOSIS — I4891 Unspecified atrial fibrillation: Secondary | ICD-10-CM

## 2023-06-11 DIAGNOSIS — K922 Gastrointestinal hemorrhage, unspecified: Secondary | ICD-10-CM | POA: Diagnosis not present

## 2023-06-11 DIAGNOSIS — S81802S Unspecified open wound, left lower leg, sequela: Secondary | ICD-10-CM | POA: Diagnosis not present

## 2023-06-11 DIAGNOSIS — E871 Hypo-osmolality and hyponatremia: Secondary | ICD-10-CM | POA: Diagnosis not present

## 2023-06-11 DIAGNOSIS — E559 Vitamin D deficiency, unspecified: Secondary | ICD-10-CM

## 2023-06-11 DIAGNOSIS — M79604 Pain in right leg: Secondary | ICD-10-CM | POA: Diagnosis not present

## 2023-06-11 DIAGNOSIS — Z791 Long term (current) use of non-steroidal anti-inflammatories (NSAID): Secondary | ICD-10-CM | POA: Diagnosis not present

## 2023-06-11 DIAGNOSIS — L03115 Cellulitis of right lower limb: Secondary | ICD-10-CM | POA: Diagnosis not present

## 2023-06-11 DIAGNOSIS — G8929 Other chronic pain: Secondary | ICD-10-CM | POA: Diagnosis not present

## 2023-06-11 DIAGNOSIS — Z7902 Long term (current) use of antithrombotics/antiplatelets: Secondary | ICD-10-CM | POA: Diagnosis not present

## 2023-06-11 DIAGNOSIS — Z556 Problems related to health literacy: Secondary | ICD-10-CM | POA: Diagnosis not present

## 2023-06-11 DIAGNOSIS — I5022 Chronic systolic (congestive) heart failure: Secondary | ICD-10-CM | POA: Diagnosis not present

## 2023-06-11 MED ORDER — LISINOPRIL 5 MG PO TABS: 5.0000 mg | ORAL_TABLET | Freq: Every day | ORAL | 3 refills | Status: DC

## 2023-06-11 MED ORDER — METOPROLOL SUCCINATE ER 25 MG PO TB24: 25.0000 mg | ORAL_TABLET | Freq: Every day | ORAL | 1 refills | Status: DC

## 2023-06-11 NOTE — Patient Instructions (Addendum)
[facility] - Phone: [phone]  - Sheppard Plumber, NP, Dr. Gilda Crease   [facility] - Phone- [phone] - Dr Logan Bores (foot Dr.)  [facility] (Cardiology) - Phone: [phone]  Clarisa Kindred, NP  [facility] - Phone: [phone]

## 2023-06-18 DIAGNOSIS — I872 Venous insufficiency (chronic) (peripheral): Secondary | ICD-10-CM | POA: Diagnosis not present

## 2023-06-18 DIAGNOSIS — L97422 Non-pressure chronic ulcer of left heel and midfoot with fat layer exposed: Secondary | ICD-10-CM | POA: Diagnosis not present

## 2023-06-18 DIAGNOSIS — I5022 Chronic systolic (congestive) heart failure: Secondary | ICD-10-CM | POA: Diagnosis not present

## 2023-06-18 DIAGNOSIS — E569 Vitamin deficiency, unspecified: Secondary | ICD-10-CM | POA: Diagnosis not present

## 2023-06-18 DIAGNOSIS — I110 Hypertensive heart disease with heart failure: Secondary | ICD-10-CM | POA: Diagnosis not present

## 2023-06-18 DIAGNOSIS — N179 Acute kidney failure, unspecified: Secondary | ICD-10-CM | POA: Diagnosis not present

## 2023-06-18 DIAGNOSIS — I4891 Unspecified atrial fibrillation: Secondary | ICD-10-CM | POA: Diagnosis not present

## 2023-06-18 DIAGNOSIS — Z7982 Long term (current) use of aspirin: Secondary | ICD-10-CM | POA: Diagnosis not present

## 2023-06-18 DIAGNOSIS — I739 Peripheral vascular disease, unspecified: Secondary | ICD-10-CM | POA: Diagnosis not present

## 2023-06-18 DIAGNOSIS — L97912 Non-pressure chronic ulcer of unspecified part of right lower leg with fat layer exposed: Secondary | ICD-10-CM | POA: Diagnosis not present

## 2023-06-18 DIAGNOSIS — D649 Anemia, unspecified: Secondary | ICD-10-CM | POA: Diagnosis not present

## 2023-06-18 DIAGNOSIS — Z7902 Long term (current) use of antithrombotics/antiplatelets: Secondary | ICD-10-CM | POA: Diagnosis not present

## 2023-06-18 DIAGNOSIS — Z791 Long term (current) use of non-steroidal anti-inflammatories (NSAID): Secondary | ICD-10-CM | POA: Diagnosis not present

## 2023-06-18 DIAGNOSIS — K922 Gastrointestinal hemorrhage, unspecified: Secondary | ICD-10-CM | POA: Diagnosis not present

## 2023-06-18 DIAGNOSIS — Z556 Problems related to health literacy: Secondary | ICD-10-CM | POA: Diagnosis not present

## 2023-06-18 DIAGNOSIS — I2510 Atherosclerotic heart disease of native coronary artery without angina pectoris: Secondary | ICD-10-CM | POA: Diagnosis not present

## 2023-06-20 ENCOUNTER — Telehealth: Payer: Self-pay

## 2023-06-26 ENCOUNTER — Encounter (INDEPENDENT_AMBULATORY_CARE_PROVIDER_SITE_OTHER): Payer: Self-pay | Admitting: Nurse Practitioner

## 2023-06-26 ENCOUNTER — Ambulatory Visit (INDEPENDENT_AMBULATORY_CARE_PROVIDER_SITE_OTHER): Payer: 59 | Admitting: Nurse Practitioner

## 2023-06-26 VITALS — BP 178/75 | HR 67 | Resp 15 | Wt 106.6 lb

## 2023-06-26 DIAGNOSIS — I10 Essential (primary) hypertension: Secondary | ICD-10-CM

## 2023-06-26 DIAGNOSIS — S81801D Unspecified open wound, right lower leg, subsequent encounter: Secondary | ICD-10-CM | POA: Diagnosis not present

## 2023-06-27 ENCOUNTER — Encounter (INDEPENDENT_AMBULATORY_CARE_PROVIDER_SITE_OTHER): Payer: Self-pay | Admitting: Nurse Practitioner

## 2023-06-27 NOTE — Progress Notes (Signed)
Subjective:    Patient ID: [name] [name], female    DOB: [birth year], 88 y.o.   MRN: [id number] Chief Complaint  Patient presents with   Follow-up    4 week no studies    [name] [name] is an 88 year old female who returns today for follow-up studies in order to evaluate for possible chronic venous insufficiency.  She recently underwent intervention on her right lower extremity due to slow healing wounds.  Despite this she had normal ABIs at her follow-up but she still continues to have wounds on her bilateral lower extremities.  These wounds are very shallow but they are uncomfortable for the patient and they have been slow in healing.  She notes that she develops these wounds and then they heal and then shortly after healing she seems to develop them again.  There was some concern for infection the patient had a culture done recently and we will send in antibiotics and the wounds have improved since that time.  The patient's family member notes that she is trying to move into an assisted living facility but she cannot while she continues to have these open wounds.   She has been in [facility] for 4 weeks.  There is improvement on the wounds on both lower extremities.  The wound on the left ankle area is nearly healed whereas the wound on the right is much more shallow with evidence of granulation.  The patient is requesting that at this time she not return in the weeks to have a bit of a break and she is willing to return to them in a few days.     Review of Systems  Musculoskeletal:  Positive for arthralgias.  Skin:  Positive for wound.  Neurological:  Positive for weakness.  All other systems reviewed and are negative.      Objective:   Physical Exam Vitals reviewed.  HENT:     Head: Normocephalic.  Cardiovascular:     Rate and Rhythm: Normal rate.  Pulmonary:     Effort: Pulmonary effort is normal.  Skin:    General: Skin is warm and dry.  Neurological:     Mental Status: She  is alert and oriented to person, place, and time.     Gait: Gait abnormal.  Psychiatric:        Mood and Affect: Mood normal.        Behavior: Behavior normal.        Thought Content: Thought content normal.        Judgment: Judgment normal.     BP (!) 178/75 (BP Location: Left Arm)   Pulse 67   Resp 15   Wt 106 lb 9.6 oz (48.4 kg)   BMI 17.74 kg/m   Past Medical History:  Diagnosis Date   AKI (acute kidney injury) (HCC) 02/20/2023   Atrial fibrillation (HCC)    Basal cell carcinoma 2021   L temple   Chronic back pain    Congestive heart failure (CHF) (HCC)    History of total hip replacement (Bilateral) 01/12/2020   Left hip replacement done in 2019.  Right hip replacement done in 2012.   Hypertension    Hyponatremia 06/11/2020   NSTEMI (non-ST elevated myocardial infarction) (HCC) 06/11/2020   Pharmacologic therapy 01/12/2020   Problems influencing health status 01/12/2020   Scoliosis     Social History   Socioeconomic History   Marital status: Single    Spouse name: Not on file   Number of children: 0  Years of education: Not on file   Highest education level: Not on file  Occupational History   Not on file  Tobacco Use   Smoking status: Former    Current packs/day: 0.00    Average packs/day: 0.5 packs/day for 68.0 years (34.0 ttl pk-yrs)    Types: Cigarettes    Start date: 06/11/1952    Quit date: 06/11/2020    Years since quitting: 3.0   Smokeless tobacco: Never  Substance and Sexual Activity   Alcohol use: Not Currently   Drug use: Not Currently   Sexual activity: Not on file  Other Topics Concern   Not on file  Social History Narrative   Not on file   Social Determinants of Health   Financial Resource Strain: Not on file  Food Insecurity: Not on file  Transportation Needs: No Transportation Needs (12/25/2022)   PRAPARE - Administrator, Civil Service (Medical): No    Lack of Transportation (Non-Medical): No  Physical Activity:  Insufficiently Active (12/25/2022)   Exercise Vital Sign    Days of Exercise per Week: 7 days    Minutes of Exercise per Session: 20 min  Stress: Stress Concern Present (12/25/2022)   [employer] of Occupational Health - Occupational Stress Questionnaire    Feeling of Stress : Very much  Social Connections: Socially Isolated (12/25/2022)   Social Connection and Isolation Panel [NHANES]    Frequency of Communication with Friends and Family: Never    Frequency of Social Gatherings with Friends and Family: Never    Attends Religious Services: Never    Database administrator or Organizations: No    Attends Banker Meetings: Never    Marital Status: Never married  Intimate Partner Violence: Not At Risk (12/25/2022)   Humiliation, Afraid, Rape, and Kick questionnaire    Fear of Current or Ex-Partner: No    Emotionally Abused: No    Physically Abused: No    Sexually Abused: No    Past Surgical History:  Procedure Laterality Date   ABDOMINAL SURGERY     bilateral hip replacement     CORONARY STENT INTERVENTION N/A 06/12/2020   Procedure: CORONARY STENT INTERVENTION;  Surgeon: Iran Ouch, MD;  Location: [facility];  Service: Cardiovascular;  Laterality: N/A;  LAD   ESOPHAGOGASTRODUODENOSCOPY (EGD) WITH PROPOFOL N/A 02/24/2023   Procedure: ESOPHAGOGASTRODUODENOSCOPY (EGD) WITH PROPOFOL;  Surgeon: Midge Minium, MD;  Location: [facility] ENDOS[facility];  Service: Endoscopy;  Laterality: N/A;   LEFT HEART CATH AND CORONARY ANGIOGRAPHY N/A 06/12/2020   Procedure: LEFT HEART CATH AND CORONARY ANGIOGRAPHY;  Surgeon: Iran Ouch, MD;  Location: [facility];  Service: Cardiovascular;  Laterality: N/A;   LOWER EXTREMITY ANGIOGRAPHY Right 02/25/2023   Procedure: Lower Extremity Angiography;  Surgeon: Renford Dills, MD;  Location: [facility];  Service: Cardiovascular;  Laterality: Right;   TONSILLECTOMY      Family History  Problem Relation Age of Onset    CVA Father    Heart disease Father    Arthritis/Rheumatoid Sister     Allergies  Allergen Reactions   Spironolactone Anaphylaxis   Fluogen [Influenza Virus Vaccine] Hives   Statins     Dizziness & weakness   Sulfa Antibiotics Other (See Comments)    Unknown, happened as a child       Latest Ref Rng & Units 03/01/2023    4:53 AM 02/28/2023    4:38 AM 02/27/2023    4:39 AM  CBC  WBC 4.0 - 10.5 K/uL 7.7  9.0  9.6   Hemoglobin 12.0 - 15.0 g/dL 9.0  8.3  9.0   Hematocrit 36.0 - 46.0 % 30.2  27.1  28.8   Platelets 150 - 400 K/uL 279  285  296       CMP     Component Value Date/Time   NA 131 (L) 02/28/2023 0438   K 4.3 02/28/2023 0438   CL 100 02/28/2023 0438   CO2 25 02/28/2023 0438   GLUCOSE 88 02/28/2023 0438   BUN 29 (H) 02/28/2023 0438   CREATININE 0.60 02/28/2023 0438   CALCIUM 7.9 (L) 02/28/2023 0438   PROT 5.7 (L) 02/21/2023 0412   ALBUMIN 2.3 (L) 02/21/2023 0412   AST 25 02/21/2023 0412   ALT 27 02/21/2023 0412   ALKPHOS 117 02/21/2023 0412   BILITOT 0.7 02/21/2023 0412   GFRNONAA >60 02/28/2023 0438     No results found.     Assessment & Plan:   1. Open wound of lower leg, right, subsequent encounter The patient's wound is showing improvement however it is slow moving.  I have agreed to a bit of a break from the Unna boots for several days.  They will work on coordinating with home health to rewrap her on Monday.  In the interim we will place some Silvadene on the patient's wounds with Aquacel and gauze to keep these wounds covered while letting her legs rest from [employer] boots.  Will follow-up again in 4 weeks.  2. Primary hypertension Continue antihypertensive medications as already ordered, these medications have been reviewed and there are no changes at this time.   Current Outpatient Medications on File Prior to Visit  Medication Sig Dispense Refill   aspirin EC 81 MG tablet Take 1 tablet (81 mg total) by mouth daily. Swallow whole. 30 tablet 0    clopidogrel (PLAVIX) 75 MG tablet Take 1 tablet (75 mg total) by mouth daily with breakfast. 90 tablet 1   feeding supplement (ENSURE ENLIVE / ENSURE PLUS) LIQD Take 237 mLs by mouth 2 (two) times daily between meals. 237 mL 12   gabapentin (NEURONTIN) 400 MG capsule Take 400 mg by mouth 4 (four) times daily.     lisinopril (ZESTRIL) 5 MG tablet Take 1 tablet (5 mg total) by mouth daily. 90 tablet 3   meloxicam (MOBIC) 15 MG tablet Take 15 mg by mouth daily.     metoprolol succinate (TOPROL-XL) 25 MG 24 hr tablet Take 1 tablet (25 mg total) by mouth daily. 90 tablet 1   oxyCODONE-acetaminophen (PERCOCET) 10-325 MG tablet Take 1 tablet by mouth every 6 (six) hours as needed. 120 tablet 0   terbinafine (LAMISIL) 250 MG tablet Take 250 mg by mouth daily.     doxycycline (VIBRAMYCIN) 100 MG capsule Take 1 capsule (100 mg total) by mouth 2 (two) times daily. (Patient not taking: Reported on 06/26/2023) 20 capsule 0   No current facility-administered medications on file prior to visit.    There are no Patient Instructions on file for this visit. No follow-ups on file.   Georgiana Spinner, NP

## 2023-07-01 DIAGNOSIS — I110 Hypertensive heart disease with heart failure: Secondary | ICD-10-CM | POA: Diagnosis not present

## 2023-07-01 DIAGNOSIS — I2510 Atherosclerotic heart disease of native coronary artery without angina pectoris: Secondary | ICD-10-CM | POA: Diagnosis not present

## 2023-07-01 DIAGNOSIS — Z7982 Long term (current) use of aspirin: Secondary | ICD-10-CM | POA: Diagnosis not present

## 2023-07-01 DIAGNOSIS — L97422 Non-pressure chronic ulcer of left heel and midfoot with fat layer exposed: Secondary | ICD-10-CM | POA: Diagnosis not present

## 2023-07-01 DIAGNOSIS — Z556 Problems related to health literacy: Secondary | ICD-10-CM | POA: Diagnosis not present

## 2023-07-01 DIAGNOSIS — I4891 Unspecified atrial fibrillation: Secondary | ICD-10-CM | POA: Diagnosis not present

## 2023-07-01 DIAGNOSIS — L97912 Non-pressure chronic ulcer of unspecified part of right lower leg with fat layer exposed: Secondary | ICD-10-CM | POA: Diagnosis not present

## 2023-07-01 DIAGNOSIS — I739 Peripheral vascular disease, unspecified: Secondary | ICD-10-CM | POA: Diagnosis not present

## 2023-07-01 DIAGNOSIS — Z7902 Long term (current) use of antithrombotics/antiplatelets: Secondary | ICD-10-CM | POA: Diagnosis not present

## 2023-07-01 DIAGNOSIS — D649 Anemia, unspecified: Secondary | ICD-10-CM | POA: Diagnosis not present

## 2023-07-01 DIAGNOSIS — I872 Venous insufficiency (chronic) (peripheral): Secondary | ICD-10-CM | POA: Diagnosis not present

## 2023-07-01 DIAGNOSIS — I5022 Chronic systolic (congestive) heart failure: Secondary | ICD-10-CM | POA: Diagnosis not present

## 2023-07-01 DIAGNOSIS — Z791 Long term (current) use of non-steroidal anti-inflammatories (NSAID): Secondary | ICD-10-CM | POA: Diagnosis not present

## 2023-07-01 DIAGNOSIS — E569 Vitamin deficiency, unspecified: Secondary | ICD-10-CM | POA: Diagnosis not present

## 2023-07-01 DIAGNOSIS — N179 Acute kidney failure, unspecified: Secondary | ICD-10-CM | POA: Diagnosis not present

## 2023-07-01 DIAGNOSIS — K922 Gastrointestinal hemorrhage, unspecified: Secondary | ICD-10-CM | POA: Diagnosis not present

## 2023-07-02 ENCOUNTER — Telehealth (INDEPENDENT_AMBULATORY_CARE_PROVIDER_SITE_OTHER): Payer: Self-pay

## 2023-07-02 NOTE — Telephone Encounter (Signed)
She can continue to use that dressing.  We used aquacell and gauze on the wound to be changed every other day, however please let us know if they deteriorate , she will need to return to unna boots

## 2023-07-02 NOTE — Telephone Encounter (Addendum)
[name] from [facility] home health called stating [name] [name] and her sister instructed that she come out sooner to look at her wounds. She stated that [name]'s wounds are improving with just the dressing and she doesn't want the unna boots. [name] also stated, she will just replace the dressing the way we did at the clinic and continue to do so if you can give her orders; however, if you would like for [facility] to continue with unna boots, she will do a PRN visit and do them if [name] will allow her.   Please advise

## 2023-07-02 NOTE — Telephone Encounter (Signed)
Orders given.  

## 2023-07-03 ENCOUNTER — Other Ambulatory Visit: Payer: Self-pay | Admitting: Podiatry

## 2023-07-03 DIAGNOSIS — Z791 Long term (current) use of non-steroidal anti-inflammatories (NSAID): Secondary | ICD-10-CM | POA: Diagnosis not present

## 2023-07-03 DIAGNOSIS — M79672 Pain in left foot: Secondary | ICD-10-CM

## 2023-07-03 DIAGNOSIS — I5022 Chronic systolic (congestive) heart failure: Secondary | ICD-10-CM | POA: Diagnosis not present

## 2023-07-03 DIAGNOSIS — D649 Anemia, unspecified: Secondary | ICD-10-CM | POA: Diagnosis not present

## 2023-07-03 DIAGNOSIS — K922 Gastrointestinal hemorrhage, unspecified: Secondary | ICD-10-CM | POA: Diagnosis not present

## 2023-07-03 DIAGNOSIS — I4891 Unspecified atrial fibrillation: Secondary | ICD-10-CM | POA: Diagnosis not present

## 2023-07-03 DIAGNOSIS — E569 Vitamin deficiency, unspecified: Secondary | ICD-10-CM | POA: Diagnosis not present

## 2023-07-03 DIAGNOSIS — I872 Venous insufficiency (chronic) (peripheral): Secondary | ICD-10-CM | POA: Diagnosis not present

## 2023-07-03 DIAGNOSIS — I739 Peripheral vascular disease, unspecified: Secondary | ICD-10-CM | POA: Diagnosis not present

## 2023-07-03 DIAGNOSIS — L97422 Non-pressure chronic ulcer of left heel and midfoot with fat layer exposed: Secondary | ICD-10-CM | POA: Diagnosis not present

## 2023-07-03 DIAGNOSIS — Z7982 Long term (current) use of aspirin: Secondary | ICD-10-CM | POA: Diagnosis not present

## 2023-07-03 DIAGNOSIS — I2510 Atherosclerotic heart disease of native coronary artery without angina pectoris: Secondary | ICD-10-CM | POA: Diagnosis not present

## 2023-07-03 DIAGNOSIS — M778 Other enthesopathies, not elsewhere classified: Secondary | ICD-10-CM

## 2023-07-03 DIAGNOSIS — B351 Tinea unguium: Secondary | ICD-10-CM

## 2023-07-03 DIAGNOSIS — N179 Acute kidney failure, unspecified: Secondary | ICD-10-CM | POA: Diagnosis not present

## 2023-07-03 DIAGNOSIS — L97912 Non-pressure chronic ulcer of unspecified part of right lower leg with fat layer exposed: Secondary | ICD-10-CM | POA: Diagnosis not present

## 2023-07-03 DIAGNOSIS — Z7902 Long term (current) use of antithrombotics/antiplatelets: Secondary | ICD-10-CM | POA: Diagnosis not present

## 2023-07-03 DIAGNOSIS — Z556 Problems related to health literacy: Secondary | ICD-10-CM | POA: Diagnosis not present

## 2023-07-03 DIAGNOSIS — I110 Hypertensive heart disease with heart failure: Secondary | ICD-10-CM | POA: Diagnosis not present

## 2023-07-03 NOTE — Telephone Encounter (Signed)
[name] [name] reached out [facility] and was able to update order.

## 2023-07-04 DIAGNOSIS — Z7902 Long term (current) use of antithrombotics/antiplatelets: Secondary | ICD-10-CM | POA: Diagnosis not present

## 2023-07-04 DIAGNOSIS — I739 Peripheral vascular disease, unspecified: Secondary | ICD-10-CM | POA: Diagnosis not present

## 2023-07-04 DIAGNOSIS — I5022 Chronic systolic (congestive) heart failure: Secondary | ICD-10-CM | POA: Diagnosis not present

## 2023-07-04 DIAGNOSIS — L97912 Non-pressure chronic ulcer of unspecified part of right lower leg with fat layer exposed: Secondary | ICD-10-CM | POA: Diagnosis not present

## 2023-07-04 DIAGNOSIS — I4891 Unspecified atrial fibrillation: Secondary | ICD-10-CM | POA: Diagnosis not present

## 2023-07-04 DIAGNOSIS — L97422 Non-pressure chronic ulcer of left heel and midfoot with fat layer exposed: Secondary | ICD-10-CM | POA: Diagnosis not present

## 2023-07-04 DIAGNOSIS — Z791 Long term (current) use of non-steroidal anti-inflammatories (NSAID): Secondary | ICD-10-CM | POA: Diagnosis not present

## 2023-07-04 DIAGNOSIS — Z556 Problems related to health literacy: Secondary | ICD-10-CM | POA: Diagnosis not present

## 2023-07-04 DIAGNOSIS — I872 Venous insufficiency (chronic) (peripheral): Secondary | ICD-10-CM | POA: Diagnosis not present

## 2023-07-04 DIAGNOSIS — I2510 Atherosclerotic heart disease of native coronary artery without angina pectoris: Secondary | ICD-10-CM | POA: Diagnosis not present

## 2023-07-04 DIAGNOSIS — K922 Gastrointestinal hemorrhage, unspecified: Secondary | ICD-10-CM | POA: Diagnosis not present

## 2023-07-04 DIAGNOSIS — D649 Anemia, unspecified: Secondary | ICD-10-CM | POA: Diagnosis not present

## 2023-07-04 DIAGNOSIS — Z7982 Long term (current) use of aspirin: Secondary | ICD-10-CM | POA: Diagnosis not present

## 2023-07-04 DIAGNOSIS — I110 Hypertensive heart disease with heart failure: Secondary | ICD-10-CM | POA: Diagnosis not present

## 2023-07-04 DIAGNOSIS — N179 Acute kidney failure, unspecified: Secondary | ICD-10-CM | POA: Diagnosis not present

## 2023-07-04 DIAGNOSIS — E569 Vitamin deficiency, unspecified: Secondary | ICD-10-CM | POA: Diagnosis not present

## 2023-07-06 IMAGING — CT CT CERVICAL SPINE W/O CM
3 of 4 series · 10 of 33 positions shown, 12 images · non-contrast
Comparison: None.

CLINICAL DATA: Fall, neck trauma



[Series 6: sagittal bone · sagittal · 0.31mm/px · 5 of 53 slices shown, 6 images]
[im 18/53  bone]
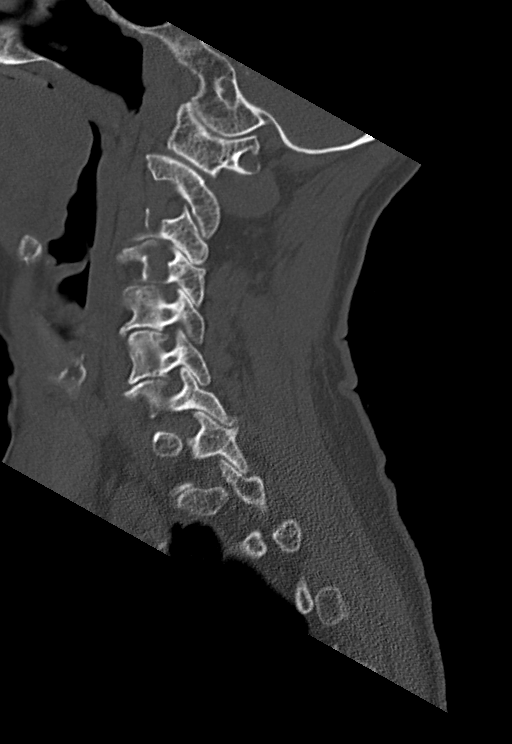
[im 22/53  bone]
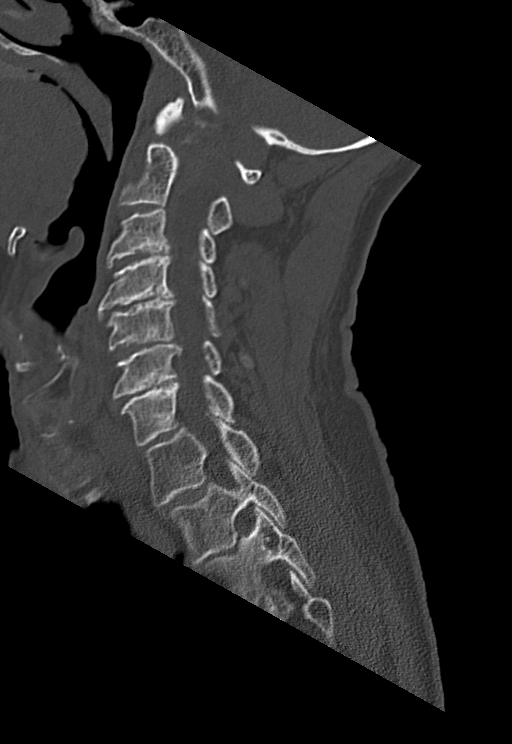
[im 27/53  soft-tissue]
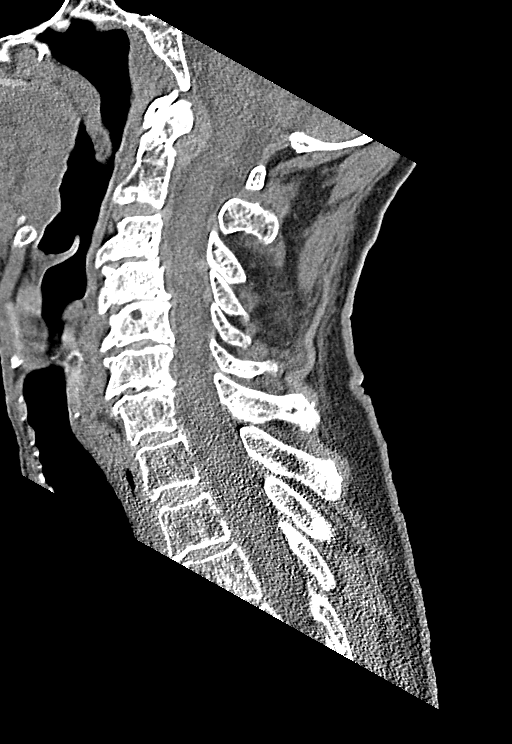
[im 27/53  bone]
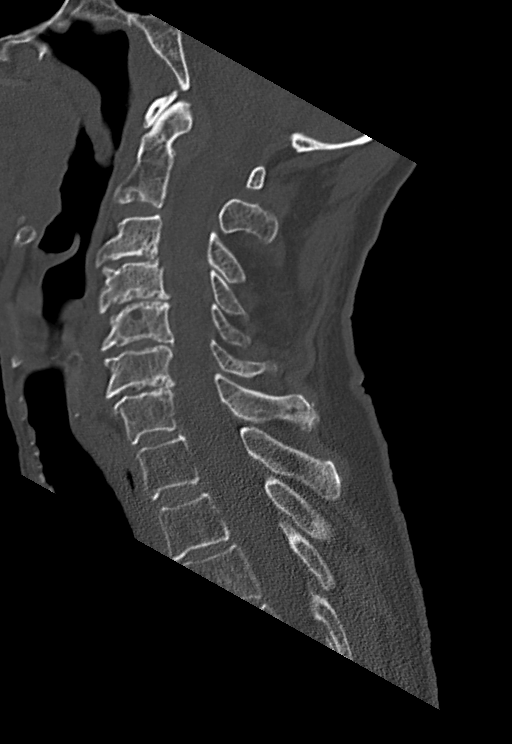
[im 31/53  bone]
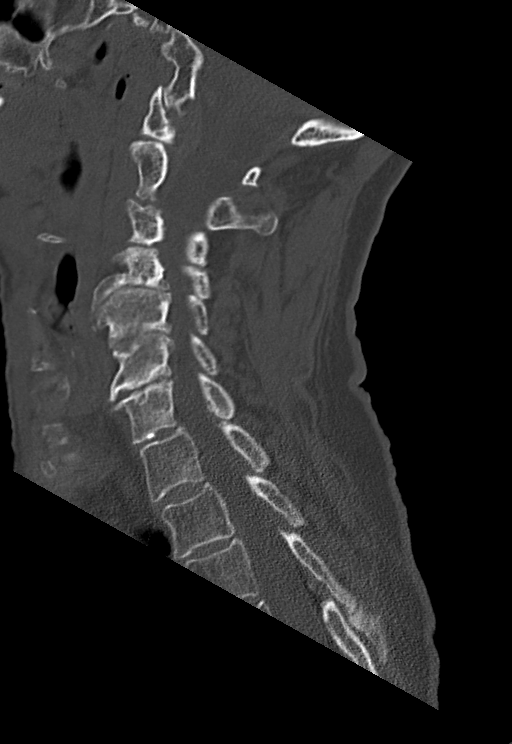
[im 35/53  bone]
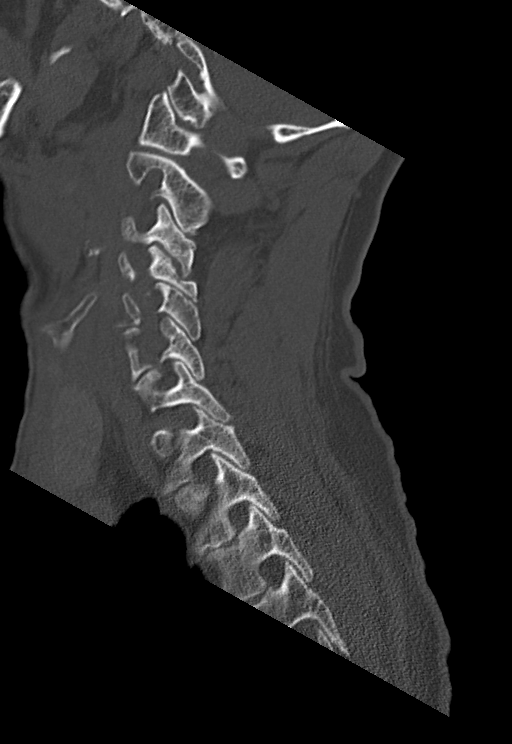

[Series 7: coronal bone · coronal · 0.21mm/px · 3 of 80 slices shown]
[im 25/80  bone]
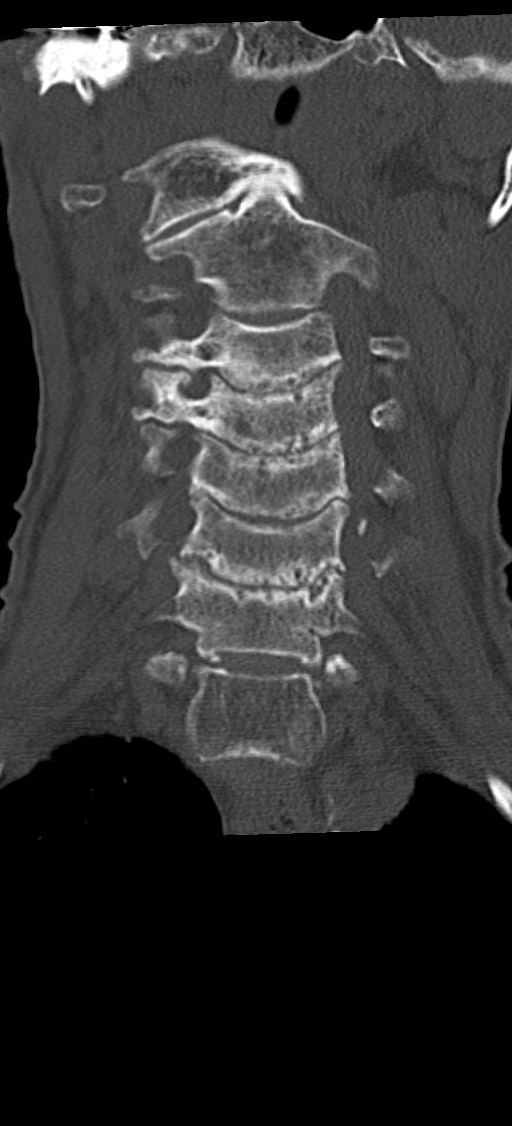
[im 35/80  bone]
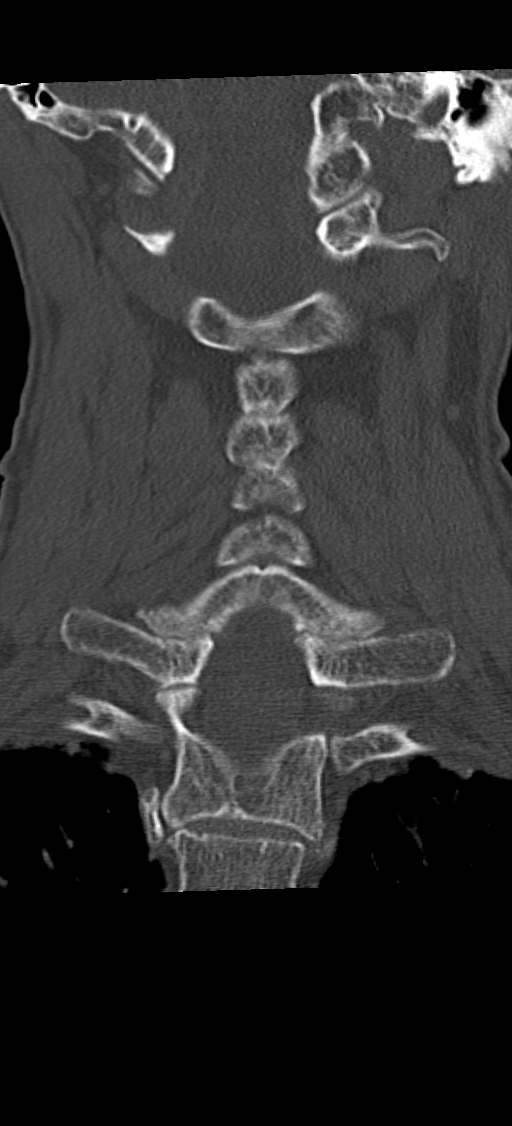
[im 45/80  bone]
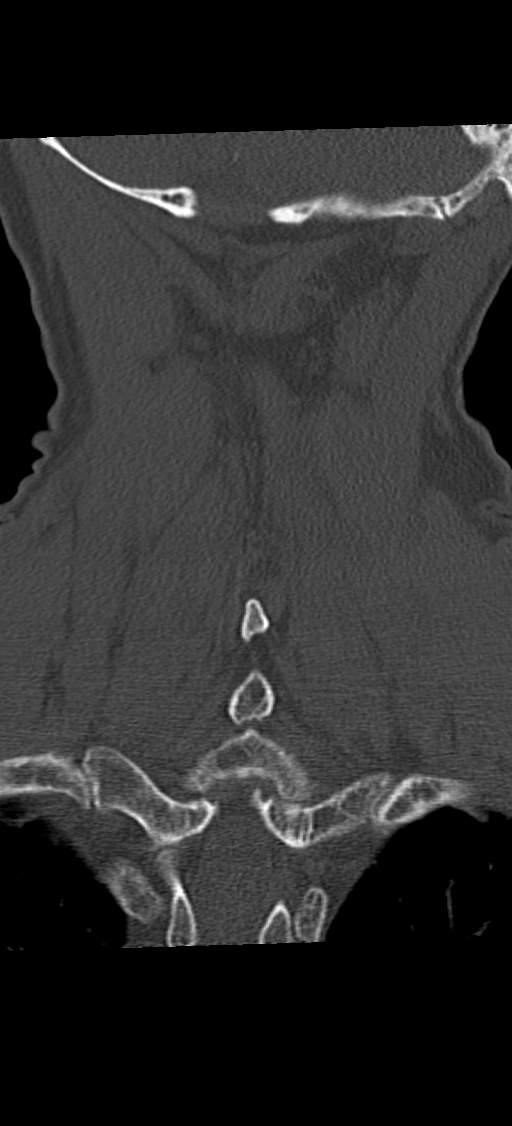

[Series 8: orthogonal bone · axial · 0.21mm/px · z∈[+829,+931]mm · 2 of 116 slices shown, 3 images]
[im 29/116  soft-tissue]
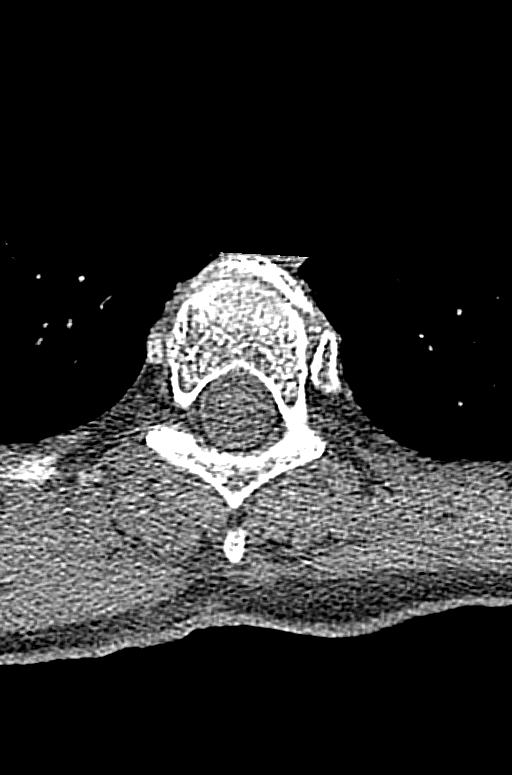
[im 29/116  bone]
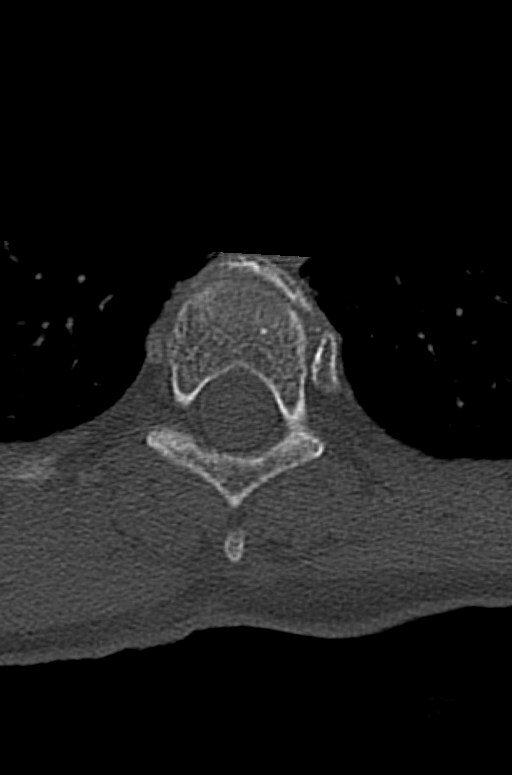
[im 87/116  bone]
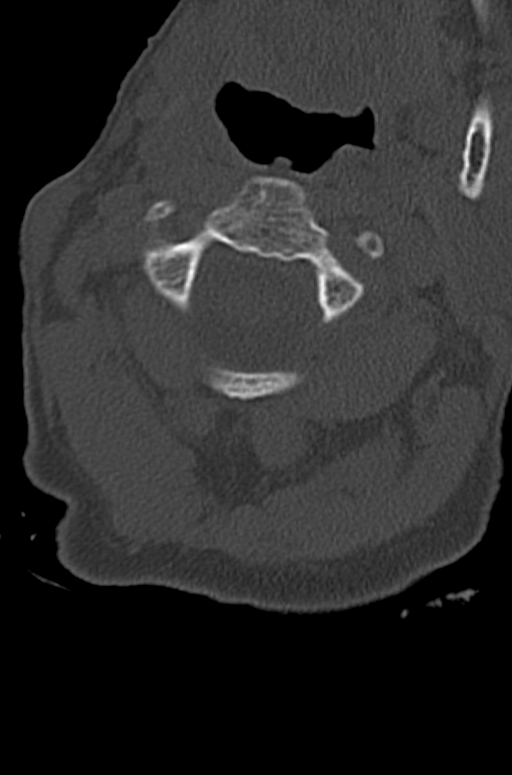

[10 of 33 positions shown; findings below may reference images not displayed]

FINDINGS: Alignment: Grade 1 anterolisthesis of C3 on C4 and C7 on T1. No
acute subluxation visualized.

Skull base and vertebrae: True 1

Soft tissues and spinal canal: No prevertebral fluid or swelling. No
visible canal hematoma.

Disc levels: Moderate intervertebral disc space narrowing from C3
through C7 with associated endplate sclerosis, cysts and
osteophytes. Bilateral facet arthropathy. Accompanying bilateral
neural foraminal narrowing.

Upper chest: Biapical pleural thickening.

Other: None.
IMPRESSION: Degenerative changes with no acute fracture or malalignment
identified.

## 2023-07-06 IMAGING — CT CT HEAD W/O CM
4 series · 17 of 47 positions shown, 19 images · non-contrast
Comparison: None.

CLINICAL DATA: Witnessed fall with head trauma



[Series 2: head wo · axial · 0.41mm/px · z∈[+1013,+1123]mm · 7 of 30 slices shown, 9 images]
[im 4/30  brain]
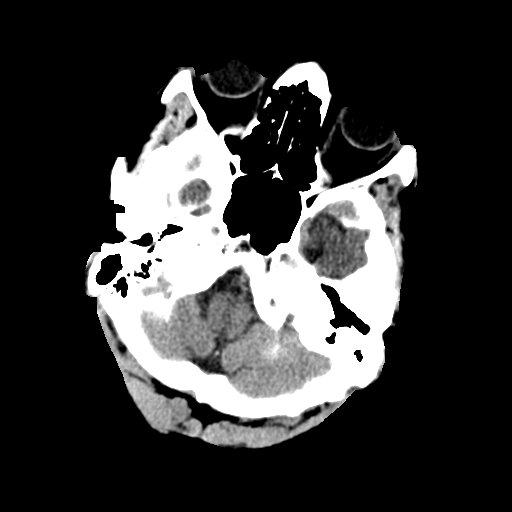
[im 4/30  bone]
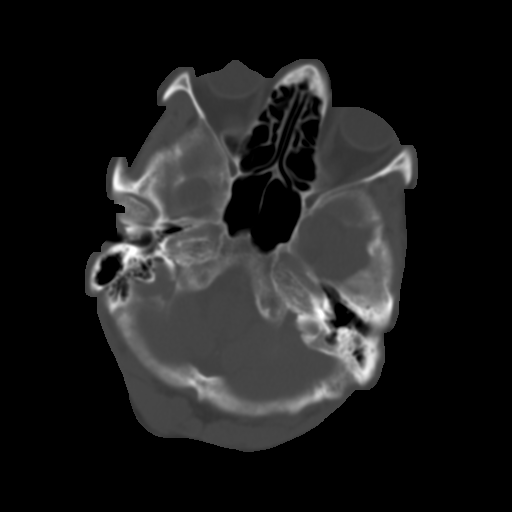
[im 8/30  brain]
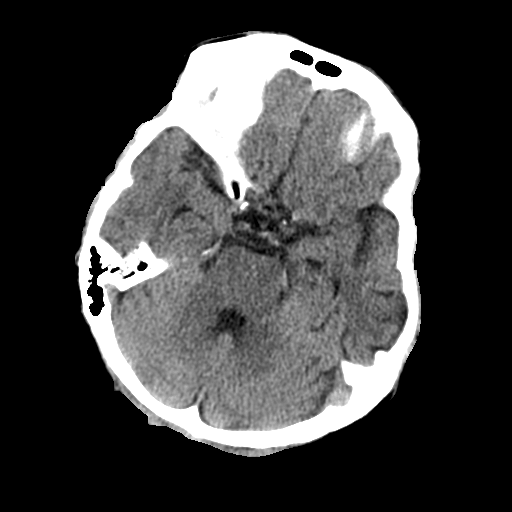
[im 11/30  brain]
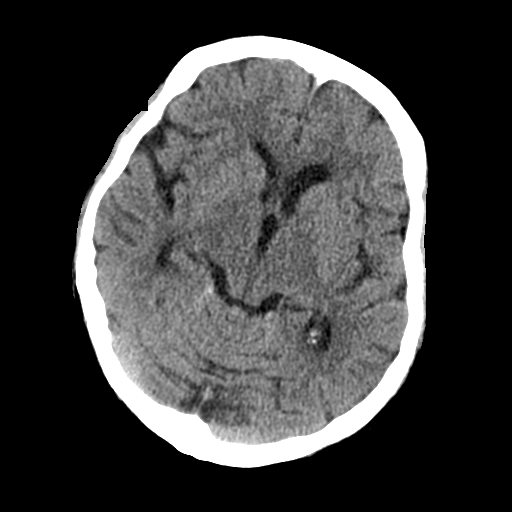
[im 15/30  brain]
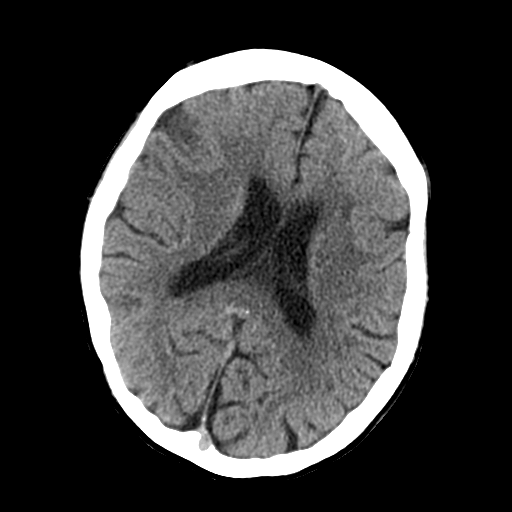
[im 19/30  brain]
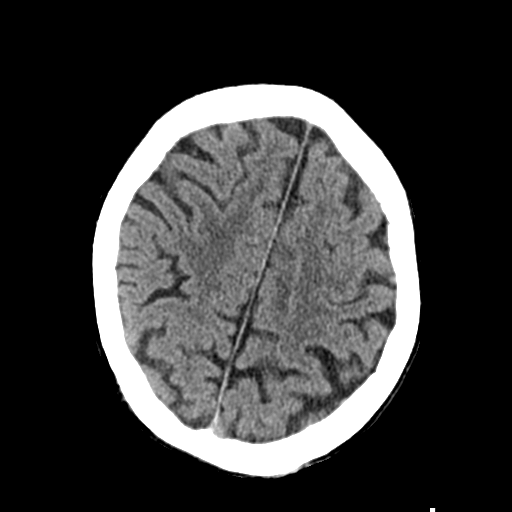
[im 19/30  bone]
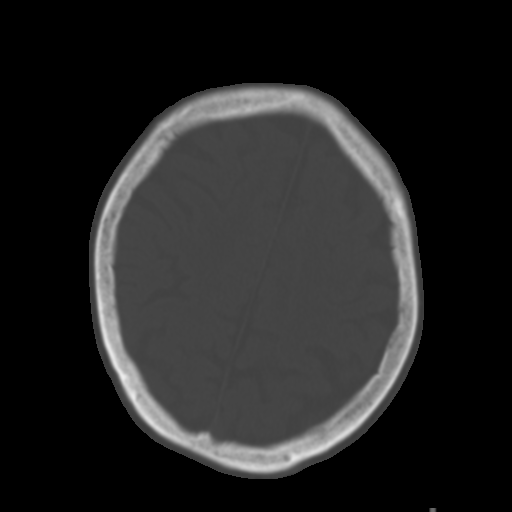
[im 22/30  brain]
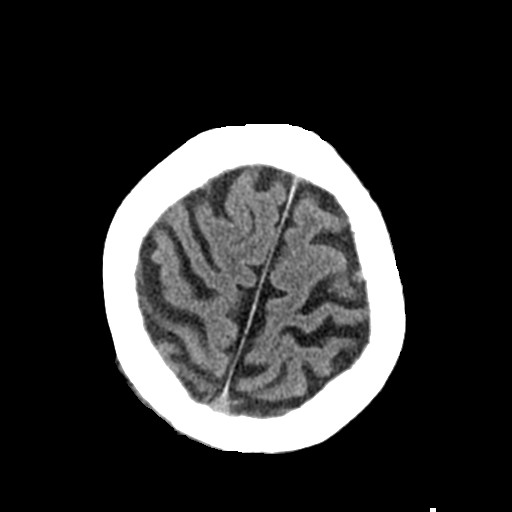
[im 26/30  brain]
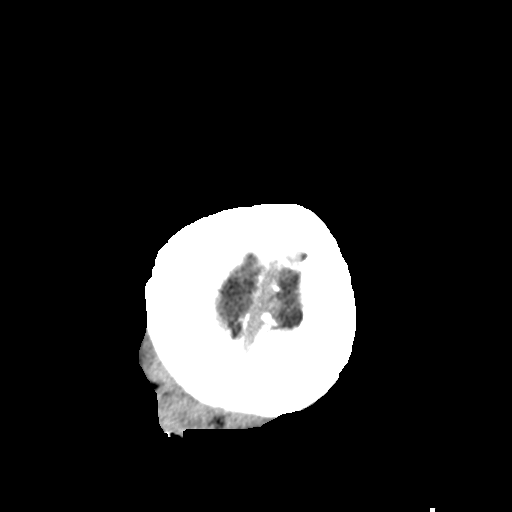

[Series 3: head bone · axial · 0.41mm/px · z∈[+1012,+1062]mm · 4 of 73 slices shown]
[im 8/73  bone]
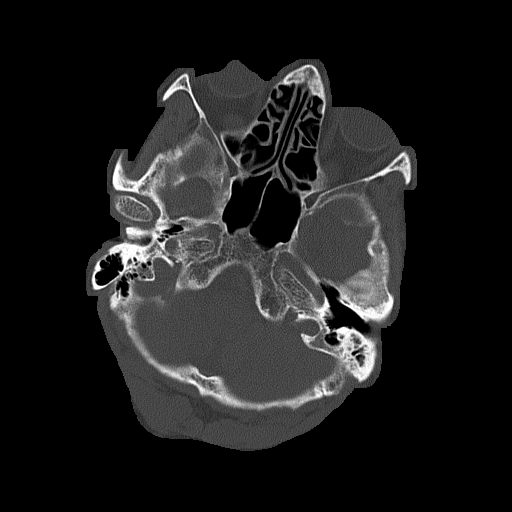
[im 15/73  bone]
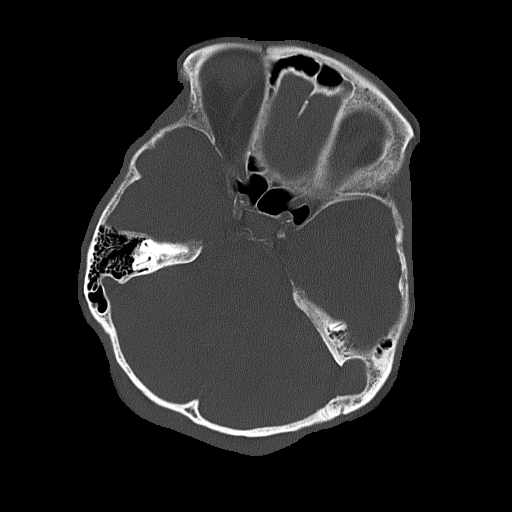
[im 22/73  bone]
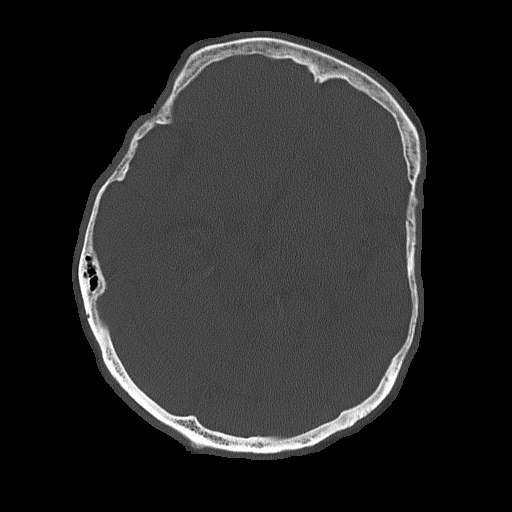
[im 33/73  bone]
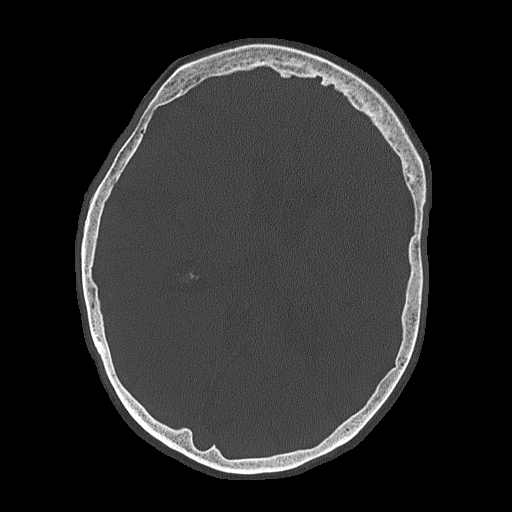

[Series 4: coronal soft tissue · coronal · 0.31mm/px · 3 of 63 slices shown]
[im 21/63  brain]
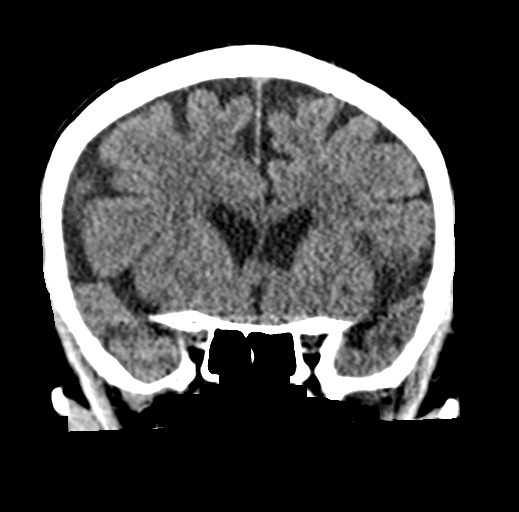
[im 28/63  brain]
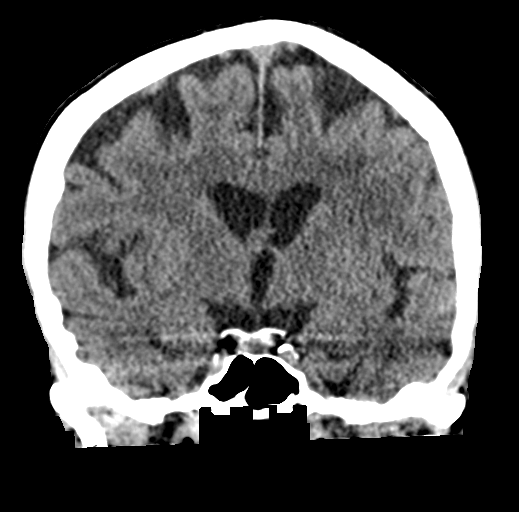
[im 35/63  brain]
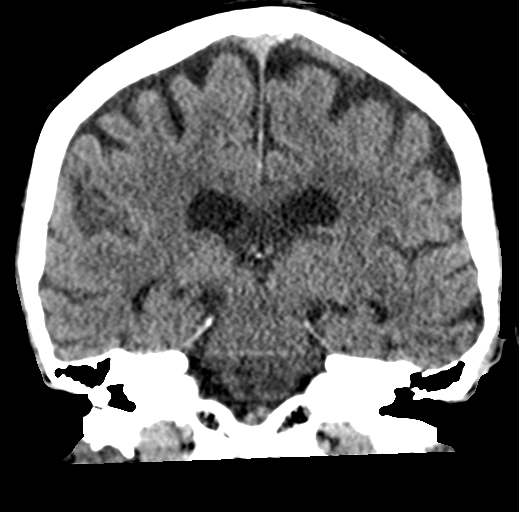

[Series 5: sagittal soft tissue · sagittal · 0.31mm/px · 3 of 54 slices shown]
[im 18/54  brain]
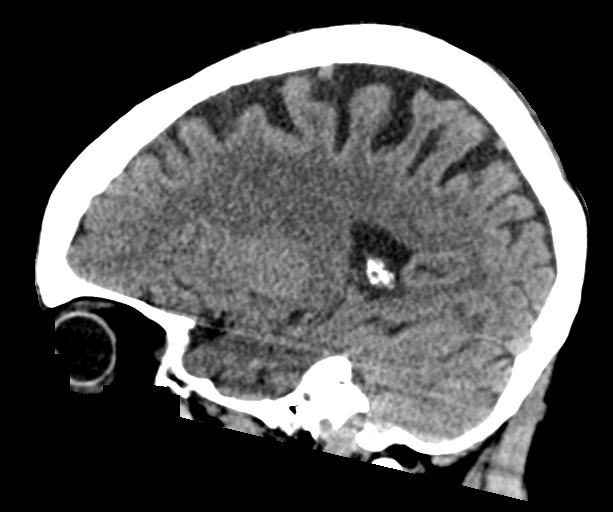
[im 27/54  brain]
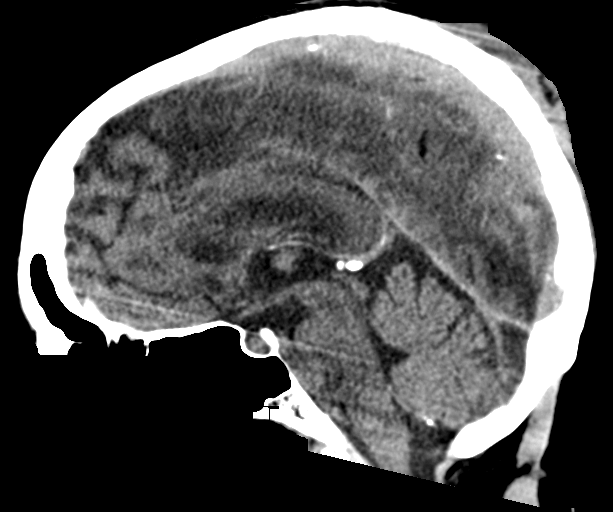
[im 36/54  brain]
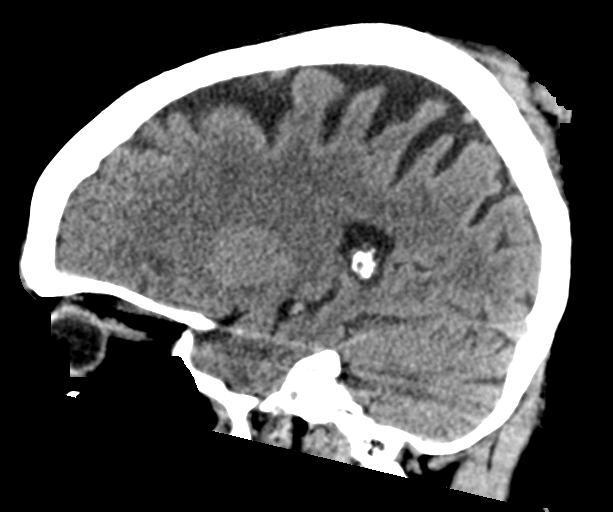

[17 of 47 positions shown; findings below may reference images not displayed]

FINDINGS: Brain: No evidence of acute infarction, hemorrhage, hydrocephalus,
extra-axial collection or mass lesion/mass effect. Scattered
low-density changes within the periventricular and subcortical white
matter compatible with chronic microvascular ischemic change. Mild
diffuse cerebral volume loss.

Vascular: Atherosclerotic calcifications involving the large vessels
of the skull base. No unexpected hyperdense vessel.

Skull: Normal. Negative for fracture or focal lesion.

Sinuses/Orbits: No acute finding.

Other: Large high right parietal scalp hematoma with overlying
laceration.
IMPRESSION: 1. No acute intracranial abnormality.
2. Large high right parietal scalp hematoma with overlying
laceration. No underlying calvarial fracture.
3. Chronic microvascular ischemic change and cerebral volume loss.

## 2023-07-07 ENCOUNTER — Other Ambulatory Visit: Payer: Self-pay | Admitting: Family

## 2023-07-07 ENCOUNTER — Telehealth: Payer: Self-pay | Admitting: Family

## 2023-07-07 ENCOUNTER — Other Ambulatory Visit: Payer: Self-pay

## 2023-07-07 DIAGNOSIS — I872 Venous insufficiency (chronic) (peripheral): Secondary | ICD-10-CM | POA: Diagnosis not present

## 2023-07-07 DIAGNOSIS — I739 Peripheral vascular disease, unspecified: Secondary | ICD-10-CM | POA: Diagnosis not present

## 2023-07-07 DIAGNOSIS — Z7902 Long term (current) use of antithrombotics/antiplatelets: Secondary | ICD-10-CM | POA: Diagnosis not present

## 2023-07-07 DIAGNOSIS — L97912 Non-pressure chronic ulcer of unspecified part of right lower leg with fat layer exposed: Secondary | ICD-10-CM | POA: Diagnosis not present

## 2023-07-07 DIAGNOSIS — G990 Autonomic neuropathy in diseases classified elsewhere: Secondary | ICD-10-CM

## 2023-07-07 DIAGNOSIS — N179 Acute kidney failure, unspecified: Secondary | ICD-10-CM | POA: Diagnosis not present

## 2023-07-07 DIAGNOSIS — I4891 Unspecified atrial fibrillation: Secondary | ICD-10-CM | POA: Diagnosis not present

## 2023-07-07 DIAGNOSIS — I5022 Chronic systolic (congestive) heart failure: Secondary | ICD-10-CM | POA: Diagnosis not present

## 2023-07-07 DIAGNOSIS — E569 Vitamin deficiency, unspecified: Secondary | ICD-10-CM | POA: Diagnosis not present

## 2023-07-07 DIAGNOSIS — Z556 Problems related to health literacy: Secondary | ICD-10-CM | POA: Diagnosis not present

## 2023-07-07 DIAGNOSIS — D649 Anemia, unspecified: Secondary | ICD-10-CM | POA: Diagnosis not present

## 2023-07-07 DIAGNOSIS — I110 Hypertensive heart disease with heart failure: Secondary | ICD-10-CM | POA: Diagnosis not present

## 2023-07-07 DIAGNOSIS — Z791 Long term (current) use of non-steroidal anti-inflammatories (NSAID): Secondary | ICD-10-CM | POA: Diagnosis not present

## 2023-07-07 DIAGNOSIS — L97422 Non-pressure chronic ulcer of left heel and midfoot with fat layer exposed: Secondary | ICD-10-CM | POA: Diagnosis not present

## 2023-07-07 DIAGNOSIS — I2510 Atherosclerotic heart disease of native coronary artery without angina pectoris: Secondary | ICD-10-CM | POA: Diagnosis not present

## 2023-07-07 DIAGNOSIS — Z7982 Long term (current) use of aspirin: Secondary | ICD-10-CM | POA: Diagnosis not present

## 2023-07-07 DIAGNOSIS — K922 Gastrointestinal hemorrhage, unspecified: Secondary | ICD-10-CM | POA: Diagnosis not present

## 2023-07-07 NOTE — Telephone Encounter (Signed)
Pt Rx request was put in this morning and pending for PCP to fill.

## 2023-07-07 NOTE — Telephone Encounter (Signed)
Patient left VM that she spoke with someone at our office this morning and she went to the pharmacy and they said they haven't gotten a call from [place]. Pharmacy phone number is [phone].   I am unsure what this is about because I did not speak with her this morning.

## 2023-07-08 ENCOUNTER — Other Ambulatory Visit: Payer: Self-pay | Admitting: Family

## 2023-07-08 MED ORDER — OXYCODONE-ACETAMINOPHEN 10-325 MG PO TABS: 1.0000 | ORAL_TABLET | Freq: Four times a day (QID) | ORAL | 0 refills | Status: DC | PRN

## 2023-07-08 NOTE — Telephone Encounter (Signed)
Already done per [facility]

## 2023-07-09 ENCOUNTER — Other Ambulatory Visit: Payer: Self-pay | Admitting: Family

## 2023-07-09 DIAGNOSIS — D649 Anemia, unspecified: Secondary | ICD-10-CM | POA: Diagnosis not present

## 2023-07-09 DIAGNOSIS — E569 Vitamin deficiency, unspecified: Secondary | ICD-10-CM | POA: Diagnosis not present

## 2023-07-09 DIAGNOSIS — Z7982 Long term (current) use of aspirin: Secondary | ICD-10-CM | POA: Diagnosis not present

## 2023-07-09 DIAGNOSIS — Z791 Long term (current) use of non-steroidal anti-inflammatories (NSAID): Secondary | ICD-10-CM | POA: Diagnosis not present

## 2023-07-09 DIAGNOSIS — I2510 Atherosclerotic heart disease of native coronary artery without angina pectoris: Secondary | ICD-10-CM | POA: Diagnosis not present

## 2023-07-09 DIAGNOSIS — I739 Peripheral vascular disease, unspecified: Secondary | ICD-10-CM | POA: Diagnosis not present

## 2023-07-09 DIAGNOSIS — K922 Gastrointestinal hemorrhage, unspecified: Secondary | ICD-10-CM | POA: Diagnosis not present

## 2023-07-09 DIAGNOSIS — I5022 Chronic systolic (congestive) heart failure: Secondary | ICD-10-CM | POA: Diagnosis not present

## 2023-07-09 DIAGNOSIS — N179 Acute kidney failure, unspecified: Secondary | ICD-10-CM | POA: Diagnosis not present

## 2023-07-09 DIAGNOSIS — L97912 Non-pressure chronic ulcer of unspecified part of right lower leg with fat layer exposed: Secondary | ICD-10-CM | POA: Diagnosis not present

## 2023-07-09 DIAGNOSIS — I110 Hypertensive heart disease with heart failure: Secondary | ICD-10-CM | POA: Diagnosis not present

## 2023-07-09 DIAGNOSIS — Z556 Problems related to health literacy: Secondary | ICD-10-CM | POA: Diagnosis not present

## 2023-07-09 DIAGNOSIS — I872 Venous insufficiency (chronic) (peripheral): Secondary | ICD-10-CM | POA: Diagnosis not present

## 2023-07-09 DIAGNOSIS — L97422 Non-pressure chronic ulcer of left heel and midfoot with fat layer exposed: Secondary | ICD-10-CM | POA: Diagnosis not present

## 2023-07-09 DIAGNOSIS — Z7902 Long term (current) use of antithrombotics/antiplatelets: Secondary | ICD-10-CM | POA: Diagnosis not present

## 2023-07-09 DIAGNOSIS — I4891 Unspecified atrial fibrillation: Secondary | ICD-10-CM | POA: Diagnosis not present

## 2023-07-10 ENCOUNTER — Encounter: Payer: Self-pay | Admitting: Family

## 2023-07-10 NOTE — Assessment & Plan Note (Signed)
Patient stable.  Well controlled with current therapy.   Continue current meds.  

## 2023-07-10 NOTE — Progress Notes (Signed)
Established Patient Office Visit  Subjective:  Patient ID: [name] [name], female    DOB: Dec 10, 1934  Age: 88 y.o. MRN: [id number]  Chief Complaint  Patient presents with   Follow-up    1 mo f/u    1 month f/u  Pt. Here today for her 1 month.  She is doing well overall, needs refills for her meds She asks if we are able to get her additional time from the nurses, as the wound on her leg is healing, but it is slow.   Doing well otherwise, mental status remains WNL on exam, despite sister reports that she gets confused and often does not recognize who she is speaking to.  She also says that this might be partially because of her hearing.     No other concerns at this time.   Past Medical History:  Diagnosis Date   AKI (acute kidney injury) (HCC) 02/20/2023   Atrial fibrillation (HCC)    Basal cell carcinoma 2021   L temple   Chronic back pain    Congestive heart failure (CHF) (HCC)    History of total hip replacement (Bilateral) 01/12/2020   Left hip replacement done in 2019.  Right hip replacement done in 2012.   Hypertension    Hyponatremia 06/11/2020   NSTEMI (non-ST elevated myocardial infarction) (HCC) 06/11/2020   Pharmacologic therapy 01/12/2020   Problems influencing health status 01/12/2020   Scoliosis     Past Surgical History:  Procedure Laterality Date   ABDOMINAL SURGERY     bilateral hip replacement     CORONARY STENT INTERVENTION N/A 06/12/2020   Procedure: CORONARY STENT INTERVENTION;  Surgeon: Iran Ouch, MD;  Location: [facility];  Service: Cardiovascular;  Laterality: N/A;  LAD   ESOPHAGOGASTRODUODENOSCOPY (EGD) WITH PROPOFOL N/A 02/24/2023   Procedure: ESOPHAGOGASTRODUODENOSCOPY (EGD) WITH PROPOFOL;  Surgeon: Midge Minium, MD;  Location: [facility];  Service: Endoscopy;  Laterality: N/A;   LEFT HEART CATH AND CORONARY ANGIOGRAPHY N/A 06/12/2020   Procedure: LEFT HEART CATH AND CORONARY ANGIOGRAPHY;  Surgeon: Iran Ouch, MD;   Location: [facility];  Service: Cardiovascular;  Laterality: N/A;   LOWER EXTREMITY ANGIOGRAPHY Right 02/25/2023   Procedure: Lower Extremity Angiography;  Surgeon: Renford Dills, MD;  Location: [facility];  Service: Cardiovascular;  Laterality: Right;   TONSILLECTOMY      Social History   Socioeconomic History   Marital status: Single    Spouse name: Not on file   Number of children: 0   Years of education: Not on file   Highest education level: Not on file  Occupational History   Not on file  Tobacco Use   Smoking status: Former    Current packs/day: 0.00    Average packs/day: 0.5 packs/day for 68.0 years (34.0 ttl pk-yrs)    Types: Cigarettes    Start date: 06/11/1952    Quit date: 06/11/2020    Years since quitting: 3.0   Smokeless tobacco: Never  Substance and Sexual Activity   Alcohol use: Not Currently   Drug use: Not Currently   Sexual activity: Not on file  Other Topics Concern   Not on file  Social History Narrative   Not on file   Social Determinants of Health   Financial Resource Strain: Not on file  Food Insecurity: Not on file  Transportation Needs: No Transportation Needs (12/25/2022)   PRAPARE - Transportation    Lack of Transportation (Medical): No    Lack of  Transportation (Non-Medical): No  Physical Activity: Insufficiently Active (12/25/2022)   Exercise Vital Sign    Days of Exercise per Week: 7 days    Minutes of Exercise per Session: 20 min  Stress: Stress Concern Present (12/25/2022)   [employer] of Occupational Health - Occupational Stress Questionnaire    Feeling of Stress : Very much  Social Connections: Socially Isolated (12/25/2022)   Social Connection and Isolation Panel [NHANES]    Frequency of Communication with Friends and Family: Never    Frequency of Social Gatherings with Friends and Family: Never    Attends Religious Services: Never    Database administrator or Organizations: No    Attends Tax inspector Meetings: Never    Marital Status: Never married  Intimate Partner Violence: Not At Risk (12/25/2022)   Humiliation, Afraid, Rape, and Kick questionnaire    Fear of Current or Ex-Partner: No    Emotionally Abused: No    Physically Abused: No    Sexually Abused: No    Family History  Problem Relation Age of Onset   CVA Father    Heart disease Father    Arthritis/Rheumatoid Sister     Allergies  Allergen Reactions   Spironolactone Anaphylaxis   Fluogen [Influenza Virus Vaccine] Hives   Statins     Dizziness & weakness   Sulfa Antibiotics Other (See Comments)    Unknown, happened as a child    Review of Systems  Musculoskeletal:  Positive for back pain and neck pain.  Skin:        Wounds on lower legs.  Improved from previous appointment.   Neurological:  Positive for weakness.  All other systems reviewed and are negative.      Objective:   BP 130/89   Pulse 78   Ht 5\' 5"  (1.651 m)   Wt 101 lb 12.8 oz (46.2 kg)   SpO2 99%   BMI 16.94 kg/m   Vitals:   06/11/23 0940  BP: 130/89  Pulse: 78  Height: 5\' 5"  (1.651 m)  Weight: 101 lb 12.8 oz (46.2 kg)  SpO2: 99%  BMI (Calculated): 16.94    Physical Exam Vitals and nursing note reviewed.  Constitutional:      Appearance: Normal appearance. She is normal weight.  HENT:     Head: Normocephalic and atraumatic.  Eyes:     Conjunctiva/sclera: Conjunctivae normal.     Pupils: Pupils are equal, round, and reactive to light.  Cardiovascular:     Rate and Rhythm: Normal rate.     Pulses: Normal pulses.  Pulmonary:     Effort: Pulmonary effort is normal.  Musculoskeletal:        General: Deformity present.     Cervical back: Deformity: Scoliosis.     Thoracic back: Deformity (Scoliosis) present. Scoliosis present.     Lumbar back: Scoliosis present.  Neurological:     General: No focal deficit present.     Mental Status: She is alert and oriented to person, place, and time. Mental status is at  baseline.  Psychiatric:        Mood and Affect: Mood normal.        Behavior: Behavior normal.        Thought Content: Thought content normal.        Judgment: Judgment normal.      No results found for any visits on 06/11/23.  Recent Results (from the past 2160 hour(s))  Aerobic culture     Status: Abnormal  Collection Time: 04/16/23 10:40 AM  Result Value Ref Range   Aerobic Bacterial Culture Final report (A)    Organism ID, Bacteria Comment (A)     Comment: Methicillin - resistant Staphylococcus aureus Based on resistance to oxacillin this isolate would be resistant to all currently available beta-lactam antimicrobial agents, with the exception of the newer cephalosporins with anti-MRSA activity, such as Ceftaroline Heavy growth    Organism ID, Bacteria Comment (A)     Comment: Enterobacter cloacae complex Moderate growth    Organism ID, Bacteria Routine flora     Comment: Heavy growth   Antimicrobial Susceptibility Comment     Comment:       ** S = Susceptible; I = Intermediate; R = Resistant **                    P = Positive; N = Negative             MICS are expressed in micrograms per mL    Antibiotic                 RSLT#1    RSLT#2    RSLT#3    RSLT#4 Amoxicillin/Clavulanic Acid              R Cefazolin                                R Cefepime                                 S Cefuroxime                               I Ciprofloxacin                  R         S Clindamycin                    S Erythromycin                   S Gentamicin                     S         S Imipenem                                 S Levofloxacin                   I         S Linezolid                      S Oxacillin                      R Penicillin                     R Rifampin                       S Tetracycline                   S  S Tobramycin                               S Trimethoprim/Sulfa             S         S Vancomycin                     S         Assessment & Plan:   Problem List Items Addressed This Visit       Active Problems   Chronic lower extremity pain (2ry area of Pain) (Bilateral) (R>L) (Chronic)    Patient stable.  Well controlled with current therapy.   Continue current meds.        Idiopathic scoliosis and kyphoscoliosis    Patient stable.  Well controlled with current therapy.   Continue current meds.        DDD (degenerative disc disease), lumbosacral - Primary (Chronic)    Patient stable.  Well controlled with current therapy.   Continue current meds.        Hypertension    Patient stable.  Well controlled with current therapy.   Continue current meds.       Relevant Medications   metoprolol succinate (TOPROL-XL) 25 MG 24 hr tablet   lisinopril (ZESTRIL) 5 MG tablet   Protein-calorie malnutrition, severe (HCC)    Patient stable.  Well controlled with current therapy.   Continue current meds.       Atrial fibrillation, chronic (HCC)    Patient stable.  Well controlled with current therapy.   Continue current meds.        Relevant Medications   metoprolol succinate (TOPROL-XL) 25 MG 24 hr tablet   lisinopril (ZESTRIL) 5 MG tablet   Vitamin D deficiency    Checking labs today.  Will continue supplements as needed.       Other Visit Diagnoses     Chronic atrial fibrillation (HCC)       Relevant Medications   metoprolol succinate (TOPROL-XL) 25 MG 24 hr tablet   lisinopril (ZESTRIL) 5 MG tablet   Wound, open, leg, right, sequela       Wound is healing well.  Will see if we are able to get the patient additional visits from home health.   Continue current meds.   Wound, open, leg, left, sequela       Wound is healing well.  Will see if we are able to get the patient additional visits from home health.   Continue current meds.       Return in about 1 month (around 07/12/2023) for F/U.   Total time spent: 30 minutes  Miki Kins, FNP  06/11/2023   This  document may have been prepared by [facility] Voice Recognition software and as such may include unintentional dictation errors.

## 2023-07-10 NOTE — Assessment & Plan Note (Signed)
Checking labs today.  Will continue supplements as needed.  

## 2023-07-11 DIAGNOSIS — D649 Anemia, unspecified: Secondary | ICD-10-CM | POA: Diagnosis not present

## 2023-07-11 DIAGNOSIS — E569 Vitamin deficiency, unspecified: Secondary | ICD-10-CM | POA: Diagnosis not present

## 2023-07-11 DIAGNOSIS — I110 Hypertensive heart disease with heart failure: Secondary | ICD-10-CM | POA: Diagnosis not present

## 2023-07-11 DIAGNOSIS — L97912 Non-pressure chronic ulcer of unspecified part of right lower leg with fat layer exposed: Secondary | ICD-10-CM | POA: Diagnosis not present

## 2023-07-11 DIAGNOSIS — I4891 Unspecified atrial fibrillation: Secondary | ICD-10-CM | POA: Diagnosis not present

## 2023-07-11 DIAGNOSIS — Z7982 Long term (current) use of aspirin: Secondary | ICD-10-CM | POA: Diagnosis not present

## 2023-07-11 DIAGNOSIS — L97422 Non-pressure chronic ulcer of left heel and midfoot with fat layer exposed: Secondary | ICD-10-CM | POA: Diagnosis not present

## 2023-07-11 DIAGNOSIS — I5022 Chronic systolic (congestive) heart failure: Secondary | ICD-10-CM | POA: Diagnosis not present

## 2023-07-11 DIAGNOSIS — I739 Peripheral vascular disease, unspecified: Secondary | ICD-10-CM | POA: Diagnosis not present

## 2023-07-11 DIAGNOSIS — I872 Venous insufficiency (chronic) (peripheral): Secondary | ICD-10-CM | POA: Diagnosis not present

## 2023-07-11 DIAGNOSIS — Z791 Long term (current) use of non-steroidal anti-inflammatories (NSAID): Secondary | ICD-10-CM | POA: Diagnosis not present

## 2023-07-11 DIAGNOSIS — I2510 Atherosclerotic heart disease of native coronary artery without angina pectoris: Secondary | ICD-10-CM | POA: Diagnosis not present

## 2023-07-11 DIAGNOSIS — Z7902 Long term (current) use of antithrombotics/antiplatelets: Secondary | ICD-10-CM | POA: Diagnosis not present

## 2023-07-11 DIAGNOSIS — Z556 Problems related to health literacy: Secondary | ICD-10-CM | POA: Diagnosis not present

## 2023-07-11 DIAGNOSIS — K922 Gastrointestinal hemorrhage, unspecified: Secondary | ICD-10-CM | POA: Diagnosis not present

## 2023-07-11 DIAGNOSIS — N179 Acute kidney failure, unspecified: Secondary | ICD-10-CM | POA: Diagnosis not present

## 2023-07-14 DIAGNOSIS — E569 Vitamin deficiency, unspecified: Secondary | ICD-10-CM | POA: Diagnosis not present

## 2023-07-14 DIAGNOSIS — I2510 Atherosclerotic heart disease of native coronary artery without angina pectoris: Secondary | ICD-10-CM | POA: Diagnosis not present

## 2023-07-14 DIAGNOSIS — L97912 Non-pressure chronic ulcer of unspecified part of right lower leg with fat layer exposed: Secondary | ICD-10-CM | POA: Diagnosis not present

## 2023-07-14 DIAGNOSIS — D649 Anemia, unspecified: Secondary | ICD-10-CM | POA: Diagnosis not present

## 2023-07-14 DIAGNOSIS — I872 Venous insufficiency (chronic) (peripheral): Secondary | ICD-10-CM | POA: Diagnosis not present

## 2023-07-14 DIAGNOSIS — Z7982 Long term (current) use of aspirin: Secondary | ICD-10-CM | POA: Diagnosis not present

## 2023-07-14 DIAGNOSIS — Z556 Problems related to health literacy: Secondary | ICD-10-CM | POA: Diagnosis not present

## 2023-07-14 DIAGNOSIS — I110 Hypertensive heart disease with heart failure: Secondary | ICD-10-CM | POA: Diagnosis not present

## 2023-07-14 DIAGNOSIS — Z791 Long term (current) use of non-steroidal anti-inflammatories (NSAID): Secondary | ICD-10-CM | POA: Diagnosis not present

## 2023-07-14 DIAGNOSIS — I4891 Unspecified atrial fibrillation: Secondary | ICD-10-CM | POA: Diagnosis not present

## 2023-07-14 DIAGNOSIS — Z7902 Long term (current) use of antithrombotics/antiplatelets: Secondary | ICD-10-CM | POA: Diagnosis not present

## 2023-07-14 DIAGNOSIS — I739 Peripheral vascular disease, unspecified: Secondary | ICD-10-CM | POA: Diagnosis not present

## 2023-07-14 DIAGNOSIS — I5022 Chronic systolic (congestive) heart failure: Secondary | ICD-10-CM | POA: Diagnosis not present

## 2023-07-14 DIAGNOSIS — L97422 Non-pressure chronic ulcer of left heel and midfoot with fat layer exposed: Secondary | ICD-10-CM | POA: Diagnosis not present

## 2023-07-14 DIAGNOSIS — N179 Acute kidney failure, unspecified: Secondary | ICD-10-CM | POA: Diagnosis not present

## 2023-07-14 DIAGNOSIS — K922 Gastrointestinal hemorrhage, unspecified: Secondary | ICD-10-CM | POA: Diagnosis not present

## 2023-07-15 DIAGNOSIS — H02831 Dermatochalasis of right upper eyelid: Secondary | ICD-10-CM | POA: Diagnosis not present

## 2023-07-16 ENCOUNTER — Ambulatory Visit: Payer: 59 | Admitting: Family

## 2023-07-16 ENCOUNTER — Encounter: Payer: Self-pay | Admitting: Family

## 2023-07-16 ENCOUNTER — Ambulatory Visit (INDEPENDENT_AMBULATORY_CARE_PROVIDER_SITE_OTHER): Payer: 59 | Admitting: Family

## 2023-07-16 VITALS — BP 166/88 | HR 97 | Ht 66.0 in | Wt 105.2 lb

## 2023-07-16 DIAGNOSIS — I110 Hypertensive heart disease with heart failure: Secondary | ICD-10-CM | POA: Diagnosis not present

## 2023-07-16 DIAGNOSIS — S81802D Unspecified open wound, left lower leg, subsequent encounter: Secondary | ICD-10-CM | POA: Diagnosis not present

## 2023-07-16 DIAGNOSIS — Z9989 Dependence on other enabling machines and devices: Secondary | ICD-10-CM

## 2023-07-16 DIAGNOSIS — M4120 Other idiopathic scoliosis, site unspecified: Secondary | ICD-10-CM

## 2023-07-16 DIAGNOSIS — G894 Chronic pain syndrome: Secondary | ICD-10-CM

## 2023-07-16 DIAGNOSIS — I5022 Chronic systolic (congestive) heart failure: Secondary | ICD-10-CM | POA: Diagnosis not present

## 2023-07-16 DIAGNOSIS — D649 Anemia, unspecified: Secondary | ICD-10-CM | POA: Diagnosis not present

## 2023-07-16 DIAGNOSIS — K922 Gastrointestinal hemorrhage, unspecified: Secondary | ICD-10-CM | POA: Diagnosis not present

## 2023-07-16 DIAGNOSIS — N179 Acute kidney failure, unspecified: Secondary | ICD-10-CM | POA: Diagnosis not present

## 2023-07-16 DIAGNOSIS — I4891 Unspecified atrial fibrillation: Secondary | ICD-10-CM | POA: Diagnosis not present

## 2023-07-16 DIAGNOSIS — Z791 Long term (current) use of non-steroidal anti-inflammatories (NSAID): Secondary | ICD-10-CM | POA: Diagnosis not present

## 2023-07-16 DIAGNOSIS — Z556 Problems related to health literacy: Secondary | ICD-10-CM | POA: Diagnosis not present

## 2023-07-16 DIAGNOSIS — S81801D Unspecified open wound, right lower leg, subsequent encounter: Secondary | ICD-10-CM | POA: Diagnosis not present

## 2023-07-16 DIAGNOSIS — I872 Venous insufficiency (chronic) (peripheral): Secondary | ICD-10-CM | POA: Diagnosis not present

## 2023-07-16 DIAGNOSIS — M5137 Other intervertebral disc degeneration, lumbosacral region: Secondary | ICD-10-CM | POA: Diagnosis not present

## 2023-07-16 DIAGNOSIS — Z7982 Long term (current) use of aspirin: Secondary | ICD-10-CM | POA: Diagnosis not present

## 2023-07-16 DIAGNOSIS — L97422 Non-pressure chronic ulcer of left heel and midfoot with fat layer exposed: Secondary | ICD-10-CM | POA: Diagnosis not present

## 2023-07-16 DIAGNOSIS — L97912 Non-pressure chronic ulcer of unspecified part of right lower leg with fat layer exposed: Secondary | ICD-10-CM | POA: Diagnosis not present

## 2023-07-16 DIAGNOSIS — I2510 Atherosclerotic heart disease of native coronary artery without angina pectoris: Secondary | ICD-10-CM | POA: Diagnosis not present

## 2023-07-16 DIAGNOSIS — Z7902 Long term (current) use of antithrombotics/antiplatelets: Secondary | ICD-10-CM | POA: Diagnosis not present

## 2023-07-16 DIAGNOSIS — E569 Vitamin deficiency, unspecified: Secondary | ICD-10-CM | POA: Diagnosis not present

## 2023-07-16 DIAGNOSIS — I739 Peripheral vascular disease, unspecified: Secondary | ICD-10-CM | POA: Diagnosis not present

## 2023-07-16 NOTE — Progress Notes (Signed)
Established Patient Office Visit  Subjective:  Patient ID: [name] [name], female    DOB: 31-Oct-1935  Age: 88 y.o. MRN: [id number]  Chief Complaint  Patient presents with   Follow-up    1 month follow up    Saw ophthalmology yesterday about her eye, is going to have surgery.   Larey Seat out of a chair yesterday, wasn't able to get herself out of the floor.  Had to drag herself across the floor to get her phone.   Has a follow up with wound clinic.  Asks if we could possibly get her more visits with Home Health to help this heal faster.    No other concerns at this time.   Past Medical History:  Diagnosis Date   AKI (acute kidney injury) (HCC) 02/20/2023   Atrial fibrillation (HCC)    Basal cell carcinoma 2021   L temple   Chronic back pain    Congestive heart failure (CHF) (HCC)    History of total hip replacement (Bilateral) 01/12/2020   Left hip replacement done in 2019.  Right hip replacement done in 2012.   Hypertension    Hyponatremia 06/11/2020   NSTEMI (non-ST elevated myocardial infarction) (HCC) 06/11/2020   Pharmacologic therapy 01/12/2020   Problems influencing health status 01/12/2020   Scoliosis     Past Surgical History:  Procedure Laterality Date   ABDOMINAL SURGERY     bilateral hip replacement     CORONARY STENT INTERVENTION N/A 06/12/2020   Procedure: CORONARY STENT INTERVENTION;  Surgeon: Iran Ouch, MD;  Location: [facility];  Service: Cardiovascular;  Laterality: N/A;  LAD   ESOPHAGOGASTRODUODENOSCOPY (EGD) WITH PROPOFOL N/A 02/24/2023   Procedure: ESOPHAGOGASTRODUODENOSCOPY (EGD) WITH PROPOFOL;  Surgeon: Midge Minium, MD;  Location: [facility];  Service: Endoscopy;  Laterality: N/A;   LEFT HEART CATH AND CORONARY ANGIOGRAPHY N/A 06/12/2020   Procedure: LEFT HEART CATH AND CORONARY ANGIOGRAPHY;  Surgeon: Iran Ouch, MD;  Location: [facility];  Service: Cardiovascular;  Laterality: N/A;   LOWER EXTREMITY ANGIOGRAPHY  Right 02/25/2023   Procedure: Lower Extremity Angiography;  Surgeon: Renford Dills, MD;  Location: [facility];  Service: Cardiovascular;  Laterality: Right;   TONSILLECTOMY      Social History   Socioeconomic History   Marital status: Single    Spouse name: Not on file   Number of children: 0   Years of education: Not on file   Highest education level: Not on file  Occupational History   Not on file  Tobacco Use   Smoking status: Former    Current packs/day: 0.00    Average packs/day: 0.5 packs/day for 68.0 years (34.0 ttl pk-yrs)    Types: Cigarettes    Start date: 06/11/1952    Quit date: 06/11/2020    Years since quitting: 3.1   Smokeless tobacco: Never  Substance and Sexual Activity   Alcohol use: Not Currently   Drug use: Not Currently   Sexual activity: Not on file  Other Topics Concern   Not on file  Social History Narrative   Not on file   Social Determinants of Health   Financial Resource Strain: Not on file  Food Insecurity: Not on file  Transportation Needs: No Transportation Needs (12/25/2022)   PRAPARE - Administrator, Civil Service (Medical): No    Lack of Transportation (Non-Medical): No  Physical Activity: Insufficiently Active (12/25/2022)   Exercise Vital Sign    Days of Exercise per Week:  7 days    Minutes of Exercise per Session: 20 min  Stress: Stress Concern Present (12/25/2022)   [employer] of Occupational Health - Occupational Stress Questionnaire    Feeling of Stress : Very much  Social Connections: Socially Isolated (12/25/2022)   Social Connection and Isolation Panel [NHANES]    Frequency of Communication with Friends and Family: Never    Frequency of Social Gatherings with Friends and Family: Never    Attends Religious Services: Never    Database administrator or Organizations: No    Attends Banker Meetings: Never    Marital Status: Never married  Intimate Partner Violence: Not At Risk  (12/25/2022)   Humiliation, Afraid, Rape, and Kick questionnaire    Fear of Current or Ex-Partner: No    Emotionally Abused: No    Physically Abused: No    Sexually Abused: No    Family History  Problem Relation Age of Onset   CVA Father    Heart disease Father    Arthritis/Rheumatoid Sister     Allergies  Allergen Reactions   Spironolactone Anaphylaxis   Fluogen [Influenza Virus Vaccine] Hives   Statins     Dizziness & weakness   Sulfa Antibiotics Other (See Comments)    Unknown, happened as a child    Review of Systems  Constitutional:  Positive for weight loss.  Musculoskeletal:  Positive for back pain, falls, joint pain, myalgias and neck pain.  Neurological:  Positive for weakness.  Psychiatric/Behavioral:  Positive for depression.   All other systems reviewed and are negative.      Objective:   BP (!) 166/88   Pulse 97   Ht 5\' 6"  (1.676 m)   Wt 105 lb 3.2 oz (47.7 kg)   SpO2 96%   BMI 16.98 kg/m   Vitals:   07/16/23 0849  BP: (!) 166/88  Pulse: 97  Height: 5\' 6"  (1.676 m)  Weight: 105 lb 3.2 oz (47.7 kg)  SpO2: 96%  BMI (Calculated): 16.99    Physical Exam Vitals and nursing note reviewed.  Constitutional:      Appearance: Normal appearance. She is normal weight.  HENT:     Head: Normocephalic.  Eyes:     Extraocular Movements: Extraocular movements intact.     Conjunctiva/sclera: Conjunctivae normal.     Pupils: Pupils are equal, round, and reactive to light.  Cardiovascular:     Rate and Rhythm: Normal rate.  Pulmonary:     Effort: Pulmonary effort is normal.  Skin:    Findings: Wound present.       Neurological:     General: No focal deficit present.     Mental Status: She is alert and oriented to person, place, and time. Mental status is at baseline.     Motor: Weakness present.     Gait: Gait abnormal (shuffling).  Psychiatric:        Mood and Affect: Mood normal.        Behavior: Behavior normal.        Thought Content:  Thought content normal.        Judgment: Judgment normal.      No results found for any visits on 07/16/23.  No results found for this or any previous visit (from the past 2160 hour(s)).     Assessment & Plan:   Problem List Items Addressed This Visit       Active Problems   Idiopathic scoliosis and kyphoscoliosis   DDD (degenerative disc disease),  lumbosacral (Chronic)   Walker as ambulation aid    Given patient's falls in the past, as well as her recent fall, I have suggested a fall alert system.  Patient has the ability to obtain these for free through her insurance company.  Contact information provided for patient/family member to get this set up.        Chronic pain syndrome - Primary (Chronic)    Patient stable.  Well controlled with current therapy.   Continue current meds.        Open wound of left lower leg    Continue current wound care regimen.  Will defer to wound center/vascular for further changes to POC.   Recheck at f/u      Open wound of lower leg, right, subsequent encounter    Continue current wound care regimen.  Will defer to wound center/vascular for further changes to POC.   Recheck at f/u       Return in about 1 month (around 08/15/2023) for F/U.   Total time spent: 20 minutes  Miki Kins, FNP  07/16/2023   This document may have been prepared by [facility] Voice Recognition software and as such may include unintentional dictation errors.

## 2023-07-22 ENCOUNTER — Encounter (INDEPENDENT_AMBULATORY_CARE_PROVIDER_SITE_OTHER): Payer: Self-pay | Admitting: Nurse Practitioner

## 2023-07-22 ENCOUNTER — Ambulatory Visit (INDEPENDENT_AMBULATORY_CARE_PROVIDER_SITE_OTHER): Payer: 59 | Admitting: Nurse Practitioner

## 2023-07-22 VITALS — BP 231/90 | HR 65 | Resp 18 | Ht 67.0 in | Wt 103.4 lb

## 2023-07-22 DIAGNOSIS — S81801D Unspecified open wound, right lower leg, subsequent encounter: Secondary | ICD-10-CM

## 2023-07-22 DIAGNOSIS — I10 Essential (primary) hypertension: Secondary | ICD-10-CM

## 2023-07-23 NOTE — Progress Notes (Signed)
Subjective:    Patient ID: [name] [name], female    DOB: [birth year], 88 y.o.   MRN: [id number] Chief Complaint  Patient presents with   Follow-up    4 week follow up    [name] [name] is an 88 year old female who returns today for follow-up studies in order to evaluate for possible chronic venous insufficiency.  She recently underwent intervention on her right lower extremity due to slow healing wounds.  Despite this she had normal ABIs at her follow-up but she still continues to have wounds on her bilateral lower extremities.  These wounds are very shallow but they are uncomfortable for the patient and they have been slow in healing.  She notes that she develops these wounds and then they heal and then shortly after healing she seems to develop them again.  There was some concern for infection the patient had a culture done recently and we will send in antibiotics and the wounds have improved since that time.  The patient's family member notes that she is trying to move into an assisted living facility but she cannot while she continues to have these open wounds.   She was previously removed from [facility] at her request at her last visit.  Since that time her swelling has been kept under good control.  However the wounds themselves seem to have worsened somewhat.    Review of Systems  Musculoskeletal:  Positive for arthralgias.  Skin:  Positive for wound.  Neurological:  Positive for weakness.  All other systems reviewed and are negative.      Objective:   Physical Exam Vitals reviewed.  HENT:     Head: Normocephalic.  Cardiovascular:     Rate and Rhythm: Normal rate.  Pulmonary:     Effort: Pulmonary effort is normal.  Skin:    General: Skin is warm and dry.  Neurological:     Mental Status: She is alert and oriented to person, place, and time.     Gait: Gait abnormal.  Psychiatric:        Mood and Affect: Mood normal.        Behavior: Behavior normal.        Thought  Content: Thought content normal.        Judgment: Judgment normal.     BP (!) 231/90 (BP Location: Right Arm)   Pulse 65   Resp 18   Ht 5\' 7"  (1.702 m)   Wt 103 lb 6.4 oz (46.9 kg)   BMI 16.19 kg/m   Past Medical History:  Diagnosis Date   AKI (acute kidney injury) (HCC) 02/20/2023   Atrial fibrillation (HCC)    Basal cell carcinoma 2021   L temple   Chronic back pain    Congestive heart failure (CHF) (HCC)    History of total hip replacement (Bilateral) 01/12/2020   Left hip replacement done in 2019.  Right hip replacement done in 2012.   Hypertension    Hyponatremia 06/11/2020   NSTEMI (non-ST elevated myocardial infarction) (HCC) 06/11/2020   Pharmacologic therapy 01/12/2020   Problems influencing health status 01/12/2020   Scoliosis     Social History   Socioeconomic History   Marital status: Single    Spouse name: Not on file   Number of children: 0   Years of education: Not on file   Highest education level: Not on file  Occupational History   Not on file  Tobacco Use   Smoking status: Former    Current packs/day:  0.00    Average packs/day: 0.5 packs/day for 68.0 years (34.0 ttl pk-yrs)    Types: Cigarettes    Start date: 06/11/1952    Quit date: 06/11/2020    Years since quitting: 3.1   Smokeless tobacco: Never  Substance and Sexual Activity   Alcohol use: Not Currently   Drug use: Not Currently   Sexual activity: Not on file  Other Topics Concern   Not on file  Social History Narrative   Not on file   Social Determinants of Health   Financial Resource Strain: Not on file  Food Insecurity: Not on file  Transportation Needs: No Transportation Needs (12/25/2022)   PRAPARE - Administrator, Civil Service (Medical): No    Lack of Transportation (Non-Medical): No  Physical Activity: Insufficiently Active (12/25/2022)   Exercise Vital Sign    Days of Exercise per Week: 7 days    Minutes of Exercise per Session: 20 min  Stress: Stress  Concern Present (12/25/2022)   [employer] of Occupational Health - Occupational Stress Questionnaire    Feeling of Stress : Very much  Social Connections: Socially Isolated (12/25/2022)   Social Connection and Isolation Panel [NHANES]    Frequency of Communication with Friends and Family: Never    Frequency of Social Gatherings with Friends and Family: Never    Attends Religious Services: Never    Database administrator or Organizations: No    Attends Banker Meetings: Never    Marital Status: Never married  Intimate Partner Violence: Not At Risk (12/25/2022)   Humiliation, Afraid, Rape, and Kick questionnaire    Fear of Current or Ex-Partner: No    Emotionally Abused: No    Physically Abused: No    Sexually Abused: No    Past Surgical History:  Procedure Laterality Date   ABDOMINAL SURGERY     bilateral hip replacement     CORONARY STENT INTERVENTION N/A 06/12/2020   Procedure: CORONARY STENT INTERVENTION;  Surgeon: Iran Ouch, MD;  Location: [facility];  Service: Cardiovascular;  Laterality: N/A;  LAD   ESOPHAGOGASTRODUODENOSCOPY (EGD) WITH PROPOFOL N/A 02/24/2023   Procedure: ESOPHAGOGASTRODUODENOSCOPY (EGD) WITH PROPOFOL;  Surgeon: Midge Minium, MD;  Location: [facility];  Service: Endoscopy;  Laterality: N/A;   LEFT HEART CATH AND CORONARY ANGIOGRAPHY N/A 06/12/2020   Procedure: LEFT HEART CATH AND CORONARY ANGIOGRAPHY;  Surgeon: Iran Ouch, MD;  Location: [facility];  Service: Cardiovascular;  Laterality: N/A;   LOWER EXTREMITY ANGIOGRAPHY Right 02/25/2023   Procedure: Lower Extremity Angiography;  Surgeon: Renford Dills, MD;  Location: [facility];  Service: Cardiovascular;  Laterality: Right;   TONSILLECTOMY      Family History  Problem Relation Age of Onset   CVA Father    Heart disease Father    Arthritis/Rheumatoid Sister     Allergies  Allergen Reactions   Spironolactone Anaphylaxis   Fluogen  [Influenza Virus Vaccine] Hives   Statins     Dizziness & weakness   Sulfa Antibiotics Other (See Comments)    Unknown, happened as a child       Latest Ref Rng & Units 03/01/2023    4:53 AM 02/28/2023    4:38 AM 02/27/2023    4:39 AM  CBC  WBC 4.0 - 10.5 K/uL 7.7  9.0  9.6   Hemoglobin 12.0 - 15.0 g/dL 9.0  8.3  9.0   Hematocrit 36.0 - 46.0 % 30.2  27.1  28.8  Platelets 150 - 400 K/uL 279  285  296       CMP     Component Value Date/Time   NA 131 (L) 02/28/2023 0438   K 4.3 02/28/2023 0438   CL 100 02/28/2023 0438   CO2 25 02/28/2023 0438   GLUCOSE 88 02/28/2023 0438   BUN 29 (H) 02/28/2023 0438   CREATININE 0.60 02/28/2023 0438   CALCIUM 7.9 (L) 02/28/2023 0438   PROT 5.7 (L) 02/21/2023 0412   ALBUMIN 2.3 (L) 02/21/2023 0412   AST 25 02/21/2023 0412   ALT 27 02/21/2023 0412   ALKPHOS 117 02/21/2023 0412   BILITOT 0.7 02/21/2023 0412   GFRNONAA >60 02/28/2023 0438     No results found.     Assessment & Plan:   1. Open wound of lower leg, right, subsequent encounter The patient's wound is showing improvement however it is slow moving.  We will continue to leave the patient without antibiotics given that her swelling is much improved.  We will continue with use of Aquacel to the wound to be changed by her home health agency will have her return in 4 weeks 2. Primary hypertension Continue antihypertensive medications as already ordered, these medications have been reviewed and there are no changes at this time.   Current Outpatient Medications on File Prior to Visit  Medication Sig Dispense Refill   aspirin EC 81 MG tablet Take 1 tablet (81 mg total) by mouth daily. Swallow whole. 30 tablet 0   clopidogrel (PLAVIX) 75 MG tablet Take 1 tablet (75 mg total) by mouth daily with breakfast. 90 tablet 1   feeding supplement (ENSURE ENLIVE / ENSURE PLUS) LIQD Take 237 mLs by mouth 2 (two) times daily between meals. 237 mL 12   gabapentin (NEURONTIN) 400 MG capsule TAKE  1 CAPSULE BY MOUTH FOUR TIMES A DAY 360 capsule 1   lisinopril (ZESTRIL) 5 MG tablet Take 1 tablet (5 mg total) by mouth daily. 90 tablet 3   meloxicam (MOBIC) 15 MG tablet Take 15 mg by mouth daily.     metoprolol succinate (TOPROL-XL) 25 MG 24 hr tablet Take 1 tablet (25 mg total) by mouth daily. 90 tablet 1   oxyCODONE-acetaminophen (PERCOCET) 10-325 MG tablet Take 1 tablet by mouth every 6 (six) hours as needed. 120 tablet 0   terbinafine (LAMISIL) 250 MG tablet Take 250 mg by mouth daily.     No current facility-administered medications on file prior to visit.    There are no Patient Instructions on file for this visit. No follow-ups on file.   Georgiana Spinner, NP

## 2023-07-24 ENCOUNTER — Ambulatory Visit: Payer: 59 | Admitting: Podiatry

## 2023-07-27 NOTE — Assessment & Plan Note (Signed)
Continue current wound care regimen.  Will defer to wound center/vascular for further changes to POC.   Recheck at f/u

## 2023-07-27 NOTE — Assessment & Plan Note (Signed)
Given patient's falls in the past, as well as her recent fall, I have suggested a fall alert system.  Patient has the ability to obtain these for free through her insurance company.  Contact information provided for patient/family member to get this set up.

## 2023-07-27 NOTE — Assessment & Plan Note (Signed)
Patient stable.  Well controlled with current therapy.   Continue current meds.  

## 2023-07-28 DIAGNOSIS — L97812 Non-pressure chronic ulcer of other part of right lower leg with fat layer exposed: Secondary | ICD-10-CM | POA: Diagnosis not present

## 2023-07-28 DIAGNOSIS — L97422 Non-pressure chronic ulcer of left heel and midfoot with fat layer exposed: Secondary | ICD-10-CM | POA: Diagnosis not present

## 2023-07-28 DIAGNOSIS — D649 Anemia, unspecified: Secondary | ICD-10-CM | POA: Diagnosis not present

## 2023-07-28 DIAGNOSIS — I739 Peripheral vascular disease, unspecified: Secondary | ICD-10-CM | POA: Diagnosis not present

## 2023-07-28 DIAGNOSIS — Z7982 Long term (current) use of aspirin: Secondary | ICD-10-CM | POA: Diagnosis not present

## 2023-07-28 DIAGNOSIS — Z7902 Long term (current) use of antithrombotics/antiplatelets: Secondary | ICD-10-CM | POA: Diagnosis not present

## 2023-07-28 DIAGNOSIS — Z556 Problems related to health literacy: Secondary | ICD-10-CM | POA: Diagnosis not present

## 2023-07-28 DIAGNOSIS — E569 Vitamin deficiency, unspecified: Secondary | ICD-10-CM | POA: Diagnosis not present

## 2023-07-28 DIAGNOSIS — Z9181 History of falling: Secondary | ICD-10-CM | POA: Diagnosis not present

## 2023-07-28 DIAGNOSIS — I5022 Chronic systolic (congestive) heart failure: Secondary | ICD-10-CM | POA: Diagnosis not present

## 2023-07-28 DIAGNOSIS — I2510 Atherosclerotic heart disease of native coronary artery without angina pectoris: Secondary | ICD-10-CM | POA: Diagnosis not present

## 2023-07-28 DIAGNOSIS — I4891 Unspecified atrial fibrillation: Secondary | ICD-10-CM | POA: Diagnosis not present

## 2023-07-28 DIAGNOSIS — I872 Venous insufficiency (chronic) (peripheral): Secondary | ICD-10-CM | POA: Diagnosis not present

## 2023-07-28 DIAGNOSIS — Z791 Long term (current) use of non-steroidal anti-inflammatories (NSAID): Secondary | ICD-10-CM | POA: Diagnosis not present

## 2023-07-28 DIAGNOSIS — I110 Hypertensive heart disease with heart failure: Secondary | ICD-10-CM | POA: Diagnosis not present

## 2023-07-30 DIAGNOSIS — L97422 Non-pressure chronic ulcer of left heel and midfoot with fat layer exposed: Secondary | ICD-10-CM | POA: Diagnosis not present

## 2023-07-30 DIAGNOSIS — I2510 Atherosclerotic heart disease of native coronary artery without angina pectoris: Secondary | ICD-10-CM | POA: Diagnosis not present

## 2023-07-30 DIAGNOSIS — D649 Anemia, unspecified: Secondary | ICD-10-CM | POA: Diagnosis not present

## 2023-07-30 DIAGNOSIS — Z791 Long term (current) use of non-steroidal anti-inflammatories (NSAID): Secondary | ICD-10-CM | POA: Diagnosis not present

## 2023-07-30 DIAGNOSIS — I4891 Unspecified atrial fibrillation: Secondary | ICD-10-CM | POA: Diagnosis not present

## 2023-07-30 DIAGNOSIS — Z556 Problems related to health literacy: Secondary | ICD-10-CM | POA: Diagnosis not present

## 2023-07-30 DIAGNOSIS — Z9181 History of falling: Secondary | ICD-10-CM | POA: Diagnosis not present

## 2023-07-30 DIAGNOSIS — E569 Vitamin deficiency, unspecified: Secondary | ICD-10-CM | POA: Diagnosis not present

## 2023-07-30 DIAGNOSIS — L97812 Non-pressure chronic ulcer of other part of right lower leg with fat layer exposed: Secondary | ICD-10-CM | POA: Diagnosis not present

## 2023-07-30 DIAGNOSIS — Z7982 Long term (current) use of aspirin: Secondary | ICD-10-CM | POA: Diagnosis not present

## 2023-07-30 DIAGNOSIS — I739 Peripheral vascular disease, unspecified: Secondary | ICD-10-CM | POA: Diagnosis not present

## 2023-07-30 DIAGNOSIS — I110 Hypertensive heart disease with heart failure: Secondary | ICD-10-CM | POA: Diagnosis not present

## 2023-07-30 DIAGNOSIS — I872 Venous insufficiency (chronic) (peripheral): Secondary | ICD-10-CM | POA: Diagnosis not present

## 2023-07-30 DIAGNOSIS — I5022 Chronic systolic (congestive) heart failure: Secondary | ICD-10-CM | POA: Diagnosis not present

## 2023-07-30 DIAGNOSIS — Z7902 Long term (current) use of antithrombotics/antiplatelets: Secondary | ICD-10-CM | POA: Diagnosis not present

## 2023-07-31 ENCOUNTER — Encounter: Payer: Self-pay | Admitting: Podiatry

## 2023-07-31 ENCOUNTER — Ambulatory Visit (INDEPENDENT_AMBULATORY_CARE_PROVIDER_SITE_OTHER): Payer: 59 | Admitting: Podiatry

## 2023-07-31 DIAGNOSIS — B351 Tinea unguium: Secondary | ICD-10-CM | POA: Diagnosis not present

## 2023-07-31 DIAGNOSIS — M79675 Pain in left toe(s): Secondary | ICD-10-CM

## 2023-07-31 DIAGNOSIS — M79674 Pain in right toe(s): Secondary | ICD-10-CM | POA: Diagnosis not present

## 2023-07-31 DIAGNOSIS — L84 Corns and callosities: Secondary | ICD-10-CM

## 2023-07-31 DIAGNOSIS — L97521 Non-pressure chronic ulcer of other part of left foot limited to breakdown of skin: Secondary | ICD-10-CM | POA: Diagnosis not present

## 2023-07-31 DIAGNOSIS — I739 Peripheral vascular disease, unspecified: Secondary | ICD-10-CM

## 2023-07-31 NOTE — Patient Instructions (Signed)
DRESSING CHANGES LEFT 4TH TOE:   PHARMACY SHOPPING LIST: Saline or Wound Cleanser for cleaning wound 2 x 2 inch sterile gauze for cleaning wound BETADINE SOLUTION  A. IF DISPENSED, WEAR SURGICAL SHOE OR WALKING BOOT AT ALL TIMES.  CLEANSE ULCER WITH SALINE OR WOUND CLEANSER.  DAB DRY WITH GAUZE SPONGE.  APPLY A LIGHT AMOUNT OF BETADINE SOLUTION TO BASE OF ULCER.  APPLY BAND-AID TO LEFT 4TH TOE.  WEAR SURGICAL SHOE/BOOT DAILY AT ALL TIMES. IF SUPPLIED, WEAR HEEL PROTECTORS AT ALL TIMES WHEN IN BED.  DO NOT WALK BAREFOOT!!!  IF YOU EXPERIENCE ANY FEVER, CHILLS, NIGHTSWEATS, NAUSEA OR VOMITING, ELEVATED OR LOW BLOOD SUGARS, REPORT TO EMERGENCY ROOM.  IF YOU EXPERIENCE INCREASED REDNESS, PAIN, SWELLING, DISCOLORATION, ODOR, PUS, DRAINAGE OR WARMTH OF YOUR FOOT, REPORT TO EMERGENCY ROOM.

## 2023-07-31 NOTE — Progress Notes (Signed)
Subjective:  Patient ID: [name] [name], female    DOB: 1934-11-15,  MRN: [id number]  Indiya Darity presents to clinic today for at risk foot care. Patient has h/o PAD and preulcerative lesion(s) of both feet and painful mycotic toenails that limit ambulation. Painful toenails interfere with ambulation. Aggravating factors include wearing enclosed shoe gear. Pain is relieved with periodic professional debridement. Painful preulcerative lesion(s) is/are aggravated when weightbearing with and without shoegear. Pain is relieved with periodic professional debridement. She is accompanied by her sister on today's visit.  Chief Complaint  Patient presents with   Nail Problem    RFC,Referring Provider Miki Kins, FNP,lov:09/24      PCP is Miki Kins, FNP.  Allergies  Allergen Reactions   Spironolactone Anaphylaxis   Fluogen [Influenza Virus Vaccine] Hives   Statins     Dizziness & weakness   Sulfa Antibiotics Other (See Comments)    Unknown, happened as a child   Review of Systems: Negative except as noted in the HPI.  Objective: No changes noted in today's physical examination. There were no vitals filed for this visit. Ndea Mosko is a pleasant 88 y.o. female thin build in NAD. AAO x 3.  Vascular Examination: CFT <4 seconds b/l. DP pulses diminished b/l. PT pulses diminished b/l. Digital hair absent. Skin temperature gradient warm to cool b/l. No ischemia or gangrene. No cyanosis or clubbing noted b/l. Mild varicosities b/l LE.   Neurological Examination: Sensation grossly intact b/l with 10 gram monofilament. Vibratory sensation intact b/l. Pt has subjective symptoms of neuropathy.  Dermatological Examination: Pedal skin thin, shiny and atrophic b/l. No interdigital macerations.   Toenails 1-5 b/l thick, discolored, elongated with subungual debris and pain on dorsal palpation.   Porokeratotic lesion(s) submet head 3 right foot. No erythema, no edema, no drainage, no  fluctuance. Preulcerative lesion noted distal tip of right 3rd toe, L 2nd toe, and R 2nd toe. There is visible subdermal hemorrhage. There is no surrounding erythema, no edema, no drainage, no odor, no fluctuance.  Partial thickness ulcer distal tip of left 4th digit with hemorrhagic hyperkeratotic roof. No erythema, no edema, no drainage, no fluctuance.  Musculoskeletal Examination: Muscle strength 5/5 to all lower extremity muscle groups bilaterally. HAV with bunion deformity noted b/l LE. Hammertoe deformity noted 2-5 b/l. Utilizes walker for ambulation assistance. She is wearing sandals with strap between 1st webspace, but has preulcerative lesions interdigitally of both 2nd toes.  Radiographs: None      Assessment/Plan: 1. Pain due to onychomycosis of toenails of both feet   2. Pre-ulcerative corn or callous   3. Skin ulcer of fourth toe of left foot, limited to breakdown of skin (HCC)   4. Peripheral vascular disease (HCC)     Plan: -Patient was evaluated and treated and all questions answered.  -Patient/POA/Family member educated on diagnosis and treatment plan of routine ulcer debridement/wound care.  -Ulceration debridement achieved utilizing sharp excisional debridement with sterile scalpel blade.. Type/amount of devitalized tissue removed: nonviable hyperkeratosis. -Today's ulcer size post-debridement: 0.1 x 0.1 x 0.1 cm. -Ulceration cleansed with wound cleanser. Betadine Solution applied to base of ulceration and secured with light dressing. -Wound responded well to today's debridement. -Patient risk factors affecting healing of ulcer: neuropathy, history of foot/leg ulcer, current foot/leg ulcer b/l lower extremities, h/o MI, CAD, CHF, hyperlipidemia, h/o tobacco use in remission -[name] [name] given written instructions on daily wound care for L 3rd toe ulceration. -Dispensed Darco shoes for both feet as patient has dressings  on both feet managed by Home Health  Nurse. -Frequency of debridements needed to achieve healing: biweekly -Patient's family member present. All questions/concerns addressed on today's visit. -Mycotic toenails 1-5 bilaterally were debrided in length and girth with sterile nail nippers and dremel without incident. -Preulcerative lesion pared bilateral 2nd toes and submet head 3 right foot utilizing sterile scalpel blade. Total number pared=3. -Dispensed tube foam. Apply to bilateral great toes every morning. Remove every evening. -Patient/POA to call should there be question/concern in the interim.   Return in about 2 weeks (around 08/14/2023) for with Dr. Allena Katz for f/u left 4th toe ulcer. Follow up with me in 3 months for at risk foot care.  Freddie Breech, DPM

## 2023-08-01 DIAGNOSIS — Z791 Long term (current) use of non-steroidal anti-inflammatories (NSAID): Secondary | ICD-10-CM | POA: Diagnosis not present

## 2023-08-01 DIAGNOSIS — L97422 Non-pressure chronic ulcer of left heel and midfoot with fat layer exposed: Secondary | ICD-10-CM | POA: Diagnosis not present

## 2023-08-01 DIAGNOSIS — I739 Peripheral vascular disease, unspecified: Secondary | ICD-10-CM | POA: Diagnosis not present

## 2023-08-01 DIAGNOSIS — I2510 Atherosclerotic heart disease of native coronary artery without angina pectoris: Secondary | ICD-10-CM | POA: Diagnosis not present

## 2023-08-01 DIAGNOSIS — Z556 Problems related to health literacy: Secondary | ICD-10-CM | POA: Diagnosis not present

## 2023-08-01 DIAGNOSIS — I110 Hypertensive heart disease with heart failure: Secondary | ICD-10-CM | POA: Diagnosis not present

## 2023-08-01 DIAGNOSIS — L97812 Non-pressure chronic ulcer of other part of right lower leg with fat layer exposed: Secondary | ICD-10-CM | POA: Diagnosis not present

## 2023-08-01 DIAGNOSIS — I5022 Chronic systolic (congestive) heart failure: Secondary | ICD-10-CM | POA: Diagnosis not present

## 2023-08-01 DIAGNOSIS — I872 Venous insufficiency (chronic) (peripheral): Secondary | ICD-10-CM | POA: Diagnosis not present

## 2023-08-01 DIAGNOSIS — I4891 Unspecified atrial fibrillation: Secondary | ICD-10-CM | POA: Diagnosis not present

## 2023-08-01 DIAGNOSIS — D649 Anemia, unspecified: Secondary | ICD-10-CM | POA: Diagnosis not present

## 2023-08-01 DIAGNOSIS — E569 Vitamin deficiency, unspecified: Secondary | ICD-10-CM | POA: Diagnosis not present

## 2023-08-01 DIAGNOSIS — Z9181 History of falling: Secondary | ICD-10-CM | POA: Diagnosis not present

## 2023-08-01 DIAGNOSIS — Z7902 Long term (current) use of antithrombotics/antiplatelets: Secondary | ICD-10-CM | POA: Diagnosis not present

## 2023-08-01 DIAGNOSIS — Z7982 Long term (current) use of aspirin: Secondary | ICD-10-CM | POA: Diagnosis not present

## 2023-08-05 DIAGNOSIS — Z9181 History of falling: Secondary | ICD-10-CM | POA: Diagnosis not present

## 2023-08-05 DIAGNOSIS — I739 Peripheral vascular disease, unspecified: Secondary | ICD-10-CM | POA: Diagnosis not present

## 2023-08-05 DIAGNOSIS — L97812 Non-pressure chronic ulcer of other part of right lower leg with fat layer exposed: Secondary | ICD-10-CM | POA: Diagnosis not present

## 2023-08-05 DIAGNOSIS — I110 Hypertensive heart disease with heart failure: Secondary | ICD-10-CM | POA: Diagnosis not present

## 2023-08-05 DIAGNOSIS — I5022 Chronic systolic (congestive) heart failure: Secondary | ICD-10-CM | POA: Diagnosis not present

## 2023-08-05 DIAGNOSIS — Z7982 Long term (current) use of aspirin: Secondary | ICD-10-CM | POA: Diagnosis not present

## 2023-08-05 DIAGNOSIS — Z7902 Long term (current) use of antithrombotics/antiplatelets: Secondary | ICD-10-CM | POA: Diagnosis not present

## 2023-08-05 DIAGNOSIS — D649 Anemia, unspecified: Secondary | ICD-10-CM | POA: Diagnosis not present

## 2023-08-05 DIAGNOSIS — L97422 Non-pressure chronic ulcer of left heel and midfoot with fat layer exposed: Secondary | ICD-10-CM | POA: Diagnosis not present

## 2023-08-05 DIAGNOSIS — I872 Venous insufficiency (chronic) (peripheral): Secondary | ICD-10-CM | POA: Diagnosis not present

## 2023-08-05 DIAGNOSIS — E569 Vitamin deficiency, unspecified: Secondary | ICD-10-CM | POA: Diagnosis not present

## 2023-08-05 DIAGNOSIS — I2510 Atherosclerotic heart disease of native coronary artery without angina pectoris: Secondary | ICD-10-CM | POA: Diagnosis not present

## 2023-08-05 DIAGNOSIS — I4891 Unspecified atrial fibrillation: Secondary | ICD-10-CM | POA: Diagnosis not present

## 2023-08-05 DIAGNOSIS — Z791 Long term (current) use of non-steroidal anti-inflammatories (NSAID): Secondary | ICD-10-CM | POA: Diagnosis not present

## 2023-08-05 DIAGNOSIS — Z556 Problems related to health literacy: Secondary | ICD-10-CM | POA: Diagnosis not present

## 2023-08-06 DIAGNOSIS — I5022 Chronic systolic (congestive) heart failure: Secondary | ICD-10-CM | POA: Diagnosis not present

## 2023-08-06 DIAGNOSIS — I2510 Atherosclerotic heart disease of native coronary artery without angina pectoris: Secondary | ICD-10-CM | POA: Diagnosis not present

## 2023-08-06 DIAGNOSIS — Z7982 Long term (current) use of aspirin: Secondary | ICD-10-CM | POA: Diagnosis not present

## 2023-08-06 DIAGNOSIS — I4891 Unspecified atrial fibrillation: Secondary | ICD-10-CM | POA: Diagnosis not present

## 2023-08-06 DIAGNOSIS — Z7902 Long term (current) use of antithrombotics/antiplatelets: Secondary | ICD-10-CM | POA: Diagnosis not present

## 2023-08-06 DIAGNOSIS — E569 Vitamin deficiency, unspecified: Secondary | ICD-10-CM | POA: Diagnosis not present

## 2023-08-06 DIAGNOSIS — I872 Venous insufficiency (chronic) (peripheral): Secondary | ICD-10-CM | POA: Diagnosis not present

## 2023-08-06 DIAGNOSIS — Z9181 History of falling: Secondary | ICD-10-CM | POA: Diagnosis not present

## 2023-08-06 DIAGNOSIS — I110 Hypertensive heart disease with heart failure: Secondary | ICD-10-CM | POA: Diagnosis not present

## 2023-08-06 DIAGNOSIS — Z791 Long term (current) use of non-steroidal anti-inflammatories (NSAID): Secondary | ICD-10-CM | POA: Diagnosis not present

## 2023-08-06 DIAGNOSIS — L97422 Non-pressure chronic ulcer of left heel and midfoot with fat layer exposed: Secondary | ICD-10-CM | POA: Diagnosis not present

## 2023-08-06 DIAGNOSIS — D649 Anemia, unspecified: Secondary | ICD-10-CM | POA: Diagnosis not present

## 2023-08-06 DIAGNOSIS — L97812 Non-pressure chronic ulcer of other part of right lower leg with fat layer exposed: Secondary | ICD-10-CM | POA: Diagnosis not present

## 2023-08-06 DIAGNOSIS — Z556 Problems related to health literacy: Secondary | ICD-10-CM | POA: Diagnosis not present

## 2023-08-06 DIAGNOSIS — I739 Peripheral vascular disease, unspecified: Secondary | ICD-10-CM | POA: Diagnosis not present

## 2023-08-11 DIAGNOSIS — I5022 Chronic systolic (congestive) heart failure: Secondary | ICD-10-CM | POA: Diagnosis not present

## 2023-08-11 DIAGNOSIS — L97812 Non-pressure chronic ulcer of other part of right lower leg with fat layer exposed: Secondary | ICD-10-CM | POA: Diagnosis not present

## 2023-08-11 DIAGNOSIS — I739 Peripheral vascular disease, unspecified: Secondary | ICD-10-CM | POA: Diagnosis not present

## 2023-08-11 DIAGNOSIS — I4891 Unspecified atrial fibrillation: Secondary | ICD-10-CM | POA: Diagnosis not present

## 2023-08-11 DIAGNOSIS — L97422 Non-pressure chronic ulcer of left heel and midfoot with fat layer exposed: Secondary | ICD-10-CM | POA: Diagnosis not present

## 2023-08-11 DIAGNOSIS — I2510 Atherosclerotic heart disease of native coronary artery without angina pectoris: Secondary | ICD-10-CM | POA: Diagnosis not present

## 2023-08-11 DIAGNOSIS — Z9181 History of falling: Secondary | ICD-10-CM | POA: Diagnosis not present

## 2023-08-11 DIAGNOSIS — I872 Venous insufficiency (chronic) (peripheral): Secondary | ICD-10-CM | POA: Diagnosis not present

## 2023-08-11 DIAGNOSIS — Z7982 Long term (current) use of aspirin: Secondary | ICD-10-CM | POA: Diagnosis not present

## 2023-08-11 DIAGNOSIS — Z791 Long term (current) use of non-steroidal anti-inflammatories (NSAID): Secondary | ICD-10-CM | POA: Diagnosis not present

## 2023-08-11 DIAGNOSIS — E569 Vitamin deficiency, unspecified: Secondary | ICD-10-CM | POA: Diagnosis not present

## 2023-08-11 DIAGNOSIS — D649 Anemia, unspecified: Secondary | ICD-10-CM | POA: Diagnosis not present

## 2023-08-11 DIAGNOSIS — I110 Hypertensive heart disease with heart failure: Secondary | ICD-10-CM | POA: Diagnosis not present

## 2023-08-11 DIAGNOSIS — Z556 Problems related to health literacy: Secondary | ICD-10-CM | POA: Diagnosis not present

## 2023-08-11 DIAGNOSIS — Z7902 Long term (current) use of antithrombotics/antiplatelets: Secondary | ICD-10-CM | POA: Diagnosis not present

## 2023-08-13 DIAGNOSIS — I5022 Chronic systolic (congestive) heart failure: Secondary | ICD-10-CM | POA: Diagnosis not present

## 2023-08-13 DIAGNOSIS — E569 Vitamin deficiency, unspecified: Secondary | ICD-10-CM | POA: Diagnosis not present

## 2023-08-13 DIAGNOSIS — I2510 Atherosclerotic heart disease of native coronary artery without angina pectoris: Secondary | ICD-10-CM | POA: Diagnosis not present

## 2023-08-13 DIAGNOSIS — Z556 Problems related to health literacy: Secondary | ICD-10-CM | POA: Diagnosis not present

## 2023-08-13 DIAGNOSIS — L97422 Non-pressure chronic ulcer of left heel and midfoot with fat layer exposed: Secondary | ICD-10-CM | POA: Diagnosis not present

## 2023-08-13 DIAGNOSIS — I4891 Unspecified atrial fibrillation: Secondary | ICD-10-CM | POA: Diagnosis not present

## 2023-08-13 DIAGNOSIS — Z9181 History of falling: Secondary | ICD-10-CM | POA: Diagnosis not present

## 2023-08-13 DIAGNOSIS — I872 Venous insufficiency (chronic) (peripheral): Secondary | ICD-10-CM | POA: Diagnosis not present

## 2023-08-13 DIAGNOSIS — D649 Anemia, unspecified: Secondary | ICD-10-CM | POA: Diagnosis not present

## 2023-08-13 DIAGNOSIS — Z791 Long term (current) use of non-steroidal anti-inflammatories (NSAID): Secondary | ICD-10-CM | POA: Diagnosis not present

## 2023-08-13 DIAGNOSIS — I739 Peripheral vascular disease, unspecified: Secondary | ICD-10-CM | POA: Diagnosis not present

## 2023-08-13 DIAGNOSIS — Z7982 Long term (current) use of aspirin: Secondary | ICD-10-CM | POA: Diagnosis not present

## 2023-08-13 DIAGNOSIS — Z7902 Long term (current) use of antithrombotics/antiplatelets: Secondary | ICD-10-CM | POA: Diagnosis not present

## 2023-08-13 DIAGNOSIS — I110 Hypertensive heart disease with heart failure: Secondary | ICD-10-CM | POA: Diagnosis not present

## 2023-08-13 DIAGNOSIS — L97812 Non-pressure chronic ulcer of other part of right lower leg with fat layer exposed: Secondary | ICD-10-CM | POA: Diagnosis not present

## 2023-08-14 ENCOUNTER — Ambulatory Visit: Payer: 59 | Admitting: Podiatry

## 2023-08-14 ENCOUNTER — Other Ambulatory Visit: Payer: Self-pay

## 2023-08-14 DIAGNOSIS — L97521 Non-pressure chronic ulcer of other part of left foot limited to breakdown of skin: Secondary | ICD-10-CM | POA: Diagnosis not present

## 2023-08-14 DIAGNOSIS — I739 Peripheral vascular disease, unspecified: Secondary | ICD-10-CM | POA: Diagnosis not present

## 2023-08-14 NOTE — Progress Notes (Signed)
Subjective:  Patient ID: [name] [name], female    DOB: 10-03-1935,  MRN: [id number]  Chief Complaint  Patient presents with   Foot Ulcer    88 y.o. female presents with the above complaint.  Patient presents with complaint of fourth toe ulceration.  She states is doing a lot better.  She is known to Dr. Donzetta Matters.  She denies any other acute complaints.  She is here to get it evaluated to make sure it is due healing well.  Denies any other acute complaints   Review of Systems: Negative except as noted in the HPI. Denies N/V/F/Ch.  Past Medical History:  Diagnosis Date   AKI (acute kidney injury) (HCC) 02/20/2023   Atrial fibrillation (HCC)    Basal cell carcinoma 2021   L temple   Chronic back pain    Congestive heart failure (CHF) (HCC)    History of total hip replacement (Bilateral) 01/12/2020   Left hip replacement done in 2019.  Right hip replacement done in 2012.   Hypertension    Hyponatremia 06/11/2020   NSTEMI (non-ST elevated myocardial infarction) (HCC) 06/11/2020   Pharmacologic therapy 01/12/2020   Problems influencing health status 01/12/2020   Scoliosis     Current Outpatient Medications:    aspirin EC 81 MG tablet, Take 1 tablet (81 mg total) by mouth daily. Swallow whole., Disp: 30 tablet, Rfl: 0   clopidogrel (PLAVIX) 75 MG tablet, Take 1 tablet (75 mg total) by mouth daily with breakfast., Disp: 90 tablet, Rfl: 1   feeding supplement (ENSURE ENLIVE / ENSURE PLUS) LIQD, Take 237 mLs by mouth 2 (two) times daily between meals., Disp: 237 mL, Rfl: 12   gabapentin (NEURONTIN) 400 MG capsule, TAKE 1 CAPSULE BY MOUTH FOUR TIMES A DAY, Disp: 360 capsule, Rfl: 1   lisinopril (ZESTRIL) 5 MG tablet, Take 1 tablet (5 mg total) by mouth daily., Disp: 90 tablet, Rfl: 3   meloxicam (MOBIC) 15 MG tablet, Take 15 mg by mouth daily., Disp: , Rfl:    metoprolol succinate (TOPROL-XL) 25 MG 24 hr tablet, Take 1 tablet (25 mg total) by mouth daily., Disp: 90 tablet, Rfl: 1    oxyCODONE-acetaminophen (PERCOCET) 10-325 MG tablet, Take 1 tablet by mouth every 6 (six) hours as needed., Disp: 120 tablet, Rfl: 0   terbinafine (LAMISIL) 250 MG tablet, Take 250 mg by mouth daily., Disp: , Rfl:   Social History   Tobacco Use  Smoking Status Former   Current packs/day: 0.00   Average packs/day: 0.5 packs/day for 68.0 years (34.0 ttl pk-yrs)   Types: Cigarettes   Start date: 06/11/1952   Quit date: 06/11/2020   Years since quitting: 3.1  Smokeless Tobacco Never    Allergies  Allergen Reactions   Spironolactone Anaphylaxis   Fluogen [Influenza Virus Vaccine] Hives   Statins     Dizziness & weakness   Sulfa Antibiotics Other (See Comments)    Unknown, happened as a child   Objective:  There were no vitals filed for this visit. There is no height or weight on file to calculate BMI. Constitutional Well developed. Well nourished.  Vascular Dorsalis pedis pulses palpable bilaterally. Posterior tibial pulses palpable bilaterally. Capillary refill normal to all digits.  No cyanosis or clubbing noted. Pedal hair growth normal.  Neurologic Normal speech. Oriented to person, place, and time. Epicritic sensation to light touch grossly present bilaterally.  Dermatologic Left fourth digit ulceration completely epithelialized.  No signs of breakdown noted.  No complication noted.  Orthopedic: Normal joint  ROM without pain or crepitus bilaterally. No visible deformities. No bony tenderness.   Radiographs: None Assessment:   1. Skin ulcer of fourth toe of left foot, limited to breakdown of skin (HCC)   2. Peripheral vascular disease (HCC)    Plan:  Patient was evaluated and treated and all questions answered.  Left fourth digit skin ulceration limited to the breakdown of the skin -All questions and concerns were discussed with the patient in extensive detail -Patient is completely reepithelialized.  No further signs of breakdown noted.  I encouraged shoe gear  modifications she states understanding.  No follow-ups on file.

## 2023-08-15 ENCOUNTER — Other Ambulatory Visit: Payer: Self-pay | Admitting: Family

## 2023-08-15 ENCOUNTER — Telehealth: Payer: Self-pay | Admitting: Family

## 2023-08-15 DIAGNOSIS — Z556 Problems related to health literacy: Secondary | ICD-10-CM | POA: Diagnosis not present

## 2023-08-15 DIAGNOSIS — Z791 Long term (current) use of non-steroidal anti-inflammatories (NSAID): Secondary | ICD-10-CM | POA: Diagnosis not present

## 2023-08-15 DIAGNOSIS — Z7902 Long term (current) use of antithrombotics/antiplatelets: Secondary | ICD-10-CM | POA: Diagnosis not present

## 2023-08-15 DIAGNOSIS — I2510 Atherosclerotic heart disease of native coronary artery without angina pectoris: Secondary | ICD-10-CM | POA: Diagnosis not present

## 2023-08-15 DIAGNOSIS — E569 Vitamin deficiency, unspecified: Secondary | ICD-10-CM | POA: Diagnosis not present

## 2023-08-15 DIAGNOSIS — I4891 Unspecified atrial fibrillation: Secondary | ICD-10-CM | POA: Diagnosis not present

## 2023-08-15 DIAGNOSIS — L97812 Non-pressure chronic ulcer of other part of right lower leg with fat layer exposed: Secondary | ICD-10-CM | POA: Diagnosis not present

## 2023-08-15 DIAGNOSIS — I5022 Chronic systolic (congestive) heart failure: Secondary | ICD-10-CM | POA: Diagnosis not present

## 2023-08-15 DIAGNOSIS — I739 Peripheral vascular disease, unspecified: Secondary | ICD-10-CM | POA: Diagnosis not present

## 2023-08-15 DIAGNOSIS — I872 Venous insufficiency (chronic) (peripheral): Secondary | ICD-10-CM | POA: Diagnosis not present

## 2023-08-15 DIAGNOSIS — Z9181 History of falling: Secondary | ICD-10-CM | POA: Diagnosis not present

## 2023-08-15 DIAGNOSIS — D649 Anemia, unspecified: Secondary | ICD-10-CM | POA: Diagnosis not present

## 2023-08-15 DIAGNOSIS — Z7982 Long term (current) use of aspirin: Secondary | ICD-10-CM | POA: Diagnosis not present

## 2023-08-15 DIAGNOSIS — L97422 Non-pressure chronic ulcer of left heel and midfoot with fat layer exposed: Secondary | ICD-10-CM | POA: Diagnosis not present

## 2023-08-15 DIAGNOSIS — I110 Hypertensive heart disease with heart failure: Secondary | ICD-10-CM | POA: Diagnosis not present

## 2023-08-15 MED ORDER — OXYCODONE-ACETAMINOPHEN 10-325 MG PO TABS: 1.0000 | ORAL_TABLET | Freq: Four times a day (QID) | ORAL | 0 refills | Status: DC | PRN

## 2023-08-15 NOTE — Telephone Encounter (Signed)
[name] [name] with [facility] called in stating the 3 times a week for 3 weeks of home health is up. Patient is requesting to go down to twice a week now. Please advise.   Callback # [phone]

## 2023-08-18 DIAGNOSIS — D649 Anemia, unspecified: Secondary | ICD-10-CM | POA: Diagnosis not present

## 2023-08-18 DIAGNOSIS — I110 Hypertensive heart disease with heart failure: Secondary | ICD-10-CM | POA: Diagnosis not present

## 2023-08-18 DIAGNOSIS — Z9181 History of falling: Secondary | ICD-10-CM | POA: Diagnosis not present

## 2023-08-18 DIAGNOSIS — E569 Vitamin deficiency, unspecified: Secondary | ICD-10-CM | POA: Diagnosis not present

## 2023-08-18 DIAGNOSIS — I5022 Chronic systolic (congestive) heart failure: Secondary | ICD-10-CM | POA: Diagnosis not present

## 2023-08-18 DIAGNOSIS — I2510 Atherosclerotic heart disease of native coronary artery without angina pectoris: Secondary | ICD-10-CM | POA: Diagnosis not present

## 2023-08-18 DIAGNOSIS — Z7982 Long term (current) use of aspirin: Secondary | ICD-10-CM | POA: Diagnosis not present

## 2023-08-18 DIAGNOSIS — L97812 Non-pressure chronic ulcer of other part of right lower leg with fat layer exposed: Secondary | ICD-10-CM | POA: Diagnosis not present

## 2023-08-18 DIAGNOSIS — I872 Venous insufficiency (chronic) (peripheral): Secondary | ICD-10-CM | POA: Diagnosis not present

## 2023-08-18 DIAGNOSIS — Z556 Problems related to health literacy: Secondary | ICD-10-CM | POA: Diagnosis not present

## 2023-08-18 DIAGNOSIS — I739 Peripheral vascular disease, unspecified: Secondary | ICD-10-CM | POA: Diagnosis not present

## 2023-08-18 DIAGNOSIS — I4891 Unspecified atrial fibrillation: Secondary | ICD-10-CM | POA: Diagnosis not present

## 2023-08-18 DIAGNOSIS — L97422 Non-pressure chronic ulcer of left heel and midfoot with fat layer exposed: Secondary | ICD-10-CM | POA: Diagnosis not present

## 2023-08-18 DIAGNOSIS — Z791 Long term (current) use of non-steroidal anti-inflammatories (NSAID): Secondary | ICD-10-CM | POA: Diagnosis not present

## 2023-08-18 DIAGNOSIS — Z7902 Long term (current) use of antithrombotics/antiplatelets: Secondary | ICD-10-CM | POA: Diagnosis not present

## 2023-08-19 ENCOUNTER — Ambulatory Visit (INDEPENDENT_AMBULATORY_CARE_PROVIDER_SITE_OTHER): Payer: 59 | Admitting: Nurse Practitioner

## 2023-08-19 ENCOUNTER — Encounter (INDEPENDENT_AMBULATORY_CARE_PROVIDER_SITE_OTHER): Payer: Self-pay | Admitting: Nurse Practitioner

## 2023-08-19 VITALS — BP 200/87 | HR 71 | Resp 15 | Wt 104.6 lb

## 2023-08-19 DIAGNOSIS — I10 Essential (primary) hypertension: Secondary | ICD-10-CM

## 2023-08-19 DIAGNOSIS — S81801D Unspecified open wound, right lower leg, subsequent encounter: Secondary | ICD-10-CM | POA: Diagnosis not present

## 2023-08-20 ENCOUNTER — Telehealth (INDEPENDENT_AMBULATORY_CARE_PROVIDER_SITE_OTHER): Payer: Self-pay

## 2023-08-20 ENCOUNTER — Encounter (INDEPENDENT_AMBULATORY_CARE_PROVIDER_SITE_OTHER): Payer: Self-pay | Admitting: Nurse Practitioner

## 2023-08-20 NOTE — Telephone Encounter (Signed)
[name] called to give you some information you requested at yesterdays appointment. It's for the home health nurse  [name]

## 2023-08-20 NOTE — Telephone Encounter (Signed)
Can we call and verify? I called that number and it is not in service

## 2023-08-20 NOTE — Progress Notes (Signed)
Subjective:    Patient ID: [name] [name], female    DOB: 06-23-1935, 88 y.o.   MRN: [id number] Chief Complaint  Patient presents with   Follow-up    4 week follow uup    [name] [name] is an 88 year old female who returns today for follow-up studies in order to evaluate for possible chronic venous insufficiency.  She recently underwent intervention on her right lower extremity due to slow healing wounds.  Despite this she had normal ABIs at her follow-up but she still continues to have wounds on her bilateral lower extremities.  These wounds are very shallow but they are uncomfortable for the patient and they have been slow in healing.  She notes that she develops these wounds and then they heal and then shortly after healing she seems to develop them again.  There was some concern for infection the patient had a culture done recently and we will send in antibiotics and the wounds have improved since that time.  The patient's family member notes that she is trying to move into an assisted living facility but she cannot while she continues to have these open wounds.   She was previously removed from [facility] at her request at her last visit.  Since that time her swelling has been kept under good control.  However the wounds themselves seem to have worsened somewhat.  Despite this the patient does not want to return to [employer].    Review of Systems  Musculoskeletal:  Positive for arthralgias.  Skin:  Positive for wound.  Neurological:  Positive for weakness.  All other systems reviewed and are negative.      Objective:   Physical Exam Vitals reviewed.  HENT:     Head: Normocephalic.  Cardiovascular:     Rate and Rhythm: Normal rate.  Pulmonary:     Effort: Pulmonary effort is normal.  Skin:    General: Skin is warm and dry.  Neurological:     Mental Status: She is alert and oriented to person, place, and time.     Gait: Gait abnormal.  Psychiatric:        Mood and Affect:  Mood normal.        Behavior: Behavior normal.        Thought Content: Thought content normal.        Judgment: Judgment normal.     BP (!) 200/87 (BP Location: Right Arm)   Pulse 71   Resp 15   Wt 104 lb 9.6 oz (47.4 kg)   BMI 16.38 kg/m   Past Medical History:  Diagnosis Date   AKI (acute kidney injury) (HCC) 02/20/2023   Atrial fibrillation (HCC)    Basal cell carcinoma 2021   L temple   Chronic back pain    Congestive heart failure (CHF) (HCC)    History of total hip replacement (Bilateral) 01/12/2020   Left hip replacement done in 2019.  Right hip replacement done in 2012.   Hypertension    Hyponatremia 06/11/2020   NSTEMI (non-ST elevated myocardial infarction) (HCC) 06/11/2020   Pharmacologic therapy 01/12/2020   Problems influencing health status 01/12/2020   Scoliosis     Social History   Socioeconomic History   Marital status: Single    Spouse name: Not on file   Number of children: 0   Years of education: Not on file   Highest education level: Not on file  Occupational History   Not on file  Tobacco Use   Smoking status:  Former    Current packs/day: 0.00    Average packs/day: 0.5 packs/day for 68.0 years (34.0 ttl pk-yrs)    Types: Cigarettes    Start date: 06/11/1952    Quit date: 06/11/2020    Years since quitting: 3.1   Smokeless tobacco: Never  Substance and Sexual Activity   Alcohol use: Not Currently   Drug use: Not Currently   Sexual activity: Not on file  Other Topics Concern   Not on file  Social History Narrative   Not on file   Social Determinants of Health   Financial Resource Strain: Not on file  Food Insecurity: Not on file  Transportation Needs: No Transportation Needs (12/25/2022)   PRAPARE - Administrator, Civil Service (Medical): No    Lack of Transportation (Non-Medical): No  Physical Activity: Insufficiently Active (12/25/2022)   Exercise Vital Sign    Days of Exercise per Week: 7 days    Minutes of Exercise  per Session: 20 min  Stress: Stress Concern Present (12/25/2022)   [employer] of Occupational Health - Occupational Stress Questionnaire    Feeling of Stress : Very much  Social Connections: Socially Isolated (12/25/2022)   Social Connection and Isolation Panel [NHANES]    Frequency of Communication with Friends and Family: Never    Frequency of Social Gatherings with Friends and Family: Never    Attends Religious Services: Never    Database administrator or Organizations: No    Attends Banker Meetings: Never    Marital Status: Never married  Intimate Partner Violence: Not At Risk (12/25/2022)   Humiliation, Afraid, Rape, and Kick questionnaire    Fear of Current or Ex-Partner: No    Emotionally Abused: No    Physically Abused: No    Sexually Abused: No    Past Surgical History:  Procedure Laterality Date   ABDOMINAL SURGERY     bilateral hip replacement     CORONARY STENT INTERVENTION N/A 06/12/2020   Procedure: CORONARY STENT INTERVENTION;  Surgeon: Iran Ouch, MD;  Location: [facility];  Service: Cardiovascular;  Laterality: N/A;  LAD   ESOPHAGOGASTRODUODENOSCOPY (EGD) WITH PROPOFOL N/A 02/24/2023   Procedure: ESOPHAGOGASTRODUODENOSCOPY (EGD) WITH PROPOFOL;  Surgeon: Midge Minium, MD;  Location: [facility];  Service: Endoscopy;  Laterality: N/A;   LEFT HEART CATH AND CORONARY ANGIOGRAPHY N/A 06/12/2020   Procedure: LEFT HEART CATH AND CORONARY ANGIOGRAPHY;  Surgeon: Iran Ouch, MD;  Location: [facility];  Service: Cardiovascular;  Laterality: N/A;   LOWER EXTREMITY ANGIOGRAPHY Right 02/25/2023   Procedure: Lower Extremity Angiography;  Surgeon: Renford Dills, MD;  Location: [facility];  Service: Cardiovascular;  Laterality: Right;   TONSILLECTOMY      Family History  Problem Relation Age of Onset   CVA Father    Heart disease Father    Arthritis/Rheumatoid Sister     Allergies  Allergen Reactions    Spironolactone Anaphylaxis   Fluogen [Influenza Virus Vaccine] Hives   Statins     Dizziness & weakness   Sulfa Antibiotics Other (See Comments)    Unknown, happened as a child       Latest Ref Rng & Units 03/01/2023    4:53 AM 02/28/2023    4:38 AM 02/27/2023    4:39 AM  CBC  WBC 4.0 - 10.5 K/uL 7.7  9.0  9.6   Hemoglobin 12.0 - 15.0 g/dL 9.0  8.3  9.0   Hematocrit 36.0 - 46.0 %  30.2  27.1  28.8   Platelets 150 - 400 K/uL 279  285  296       CMP     Component Value Date/Time   NA 131 (L) 02/28/2023 0438   K 4.3 02/28/2023 0438   CL 100 02/28/2023 0438   CO2 25 02/28/2023 0438   GLUCOSE 88 02/28/2023 0438   BUN 29 (H) 02/28/2023 0438   CREATININE 0.60 02/28/2023 0438   CALCIUM 7.9 (L) 02/28/2023 0438   PROT 5.7 (L) 02/21/2023 0412   ALBUMIN 2.3 (L) 02/21/2023 0412   AST 25 02/21/2023 0412   ALT 27 02/21/2023 0412   ALKPHOS 117 02/21/2023 0412   BILITOT 0.7 02/21/2023 0412   GFRNONAA >60 02/28/2023 0438     No results found.     Assessment & Plan:   1. Open wound of lower leg, right, subsequent encounter Today the patient's wound does not show any significant improvement or worsening.  It appears stagnant.  I recommended return to the patient does not wish to do this as a very uncomfortable for her.  I also recommended treatment by wound care but the patient does not wish to be seen by wound care.  I believe she she may benefit from basic Salary appointment.  We will attempt to reach out to her home health provider in order to try to facilitate celebrate usage.  Will continue to have her follow-up.  In 4 weeks 2. Primary hypertension Continue antihypertensive medications as already ordered, these medications have been reviewed and there are no changes at this time.   Current Outpatient Medications on File Prior to Visit  Medication Sig Dispense Refill   aspirin EC 81 MG tablet Take 1 tablet (81 mg total) by mouth daily. Swallow whole. 30 tablet 0   clopidogrel  (PLAVIX) 75 MG tablet Take 1 tablet (75 mg total) by mouth daily with breakfast. 90 tablet 1   feeding supplement (ENSURE ENLIVE / ENSURE PLUS) LIQD Take 237 mLs by mouth 2 (two) times daily between meals. 237 mL 12   gabapentin (NEURONTIN) 400 MG capsule TAKE 1 CAPSULE BY MOUTH FOUR TIMES A DAY 360 capsule 1   lisinopril (ZESTRIL) 5 MG tablet Take 1 tablet (5 mg total) by mouth daily. 90 tablet 3   meloxicam (MOBIC) 15 MG tablet Take 15 mg by mouth daily.     metoprolol succinate (TOPROL-XL) 25 MG 24 hr tablet Take 1 tablet (25 mg total) by mouth daily. 90 tablet 1   oxyCODONE-acetaminophen (PERCOCET) 10-325 MG tablet Take 1 tablet by mouth every 6 (six) hours as needed. 120 tablet 0   terbinafine (LAMISIL) 250 MG tablet Take 250 mg by mouth daily.     No current facility-administered medications on file prior to visit.    There are no Patient Instructions on file for this visit. No follow-ups on file.   Georgiana Spinner, NP

## 2023-08-21 NOTE — Telephone Encounter (Signed)
I talked with [name] [name] and we are going to try to put on a collagen wound dressing to see if this helps improve her wound.  Patient will continue to follow-up as scheduled.

## 2023-08-21 NOTE — Telephone Encounter (Signed)
[name] called back with the number, [phone]. I called this morning to to verify the number. [name] is expecting your call.  Also [name] stated she is under [facility].

## 2023-08-22 DIAGNOSIS — I739 Peripheral vascular disease, unspecified: Secondary | ICD-10-CM | POA: Diagnosis not present

## 2023-08-22 DIAGNOSIS — I872 Venous insufficiency (chronic) (peripheral): Secondary | ICD-10-CM | POA: Diagnosis not present

## 2023-08-22 DIAGNOSIS — Z791 Long term (current) use of non-steroidal anti-inflammatories (NSAID): Secondary | ICD-10-CM | POA: Diagnosis not present

## 2023-08-22 DIAGNOSIS — E569 Vitamin deficiency, unspecified: Secondary | ICD-10-CM | POA: Diagnosis not present

## 2023-08-22 DIAGNOSIS — I5022 Chronic systolic (congestive) heart failure: Secondary | ICD-10-CM | POA: Diagnosis not present

## 2023-08-22 DIAGNOSIS — Z7902 Long term (current) use of antithrombotics/antiplatelets: Secondary | ICD-10-CM | POA: Diagnosis not present

## 2023-08-22 DIAGNOSIS — L97812 Non-pressure chronic ulcer of other part of right lower leg with fat layer exposed: Secondary | ICD-10-CM | POA: Diagnosis not present

## 2023-08-22 DIAGNOSIS — Z556 Problems related to health literacy: Secondary | ICD-10-CM | POA: Diagnosis not present

## 2023-08-22 DIAGNOSIS — L97422 Non-pressure chronic ulcer of left heel and midfoot with fat layer exposed: Secondary | ICD-10-CM | POA: Diagnosis not present

## 2023-08-22 DIAGNOSIS — I110 Hypertensive heart disease with heart failure: Secondary | ICD-10-CM | POA: Diagnosis not present

## 2023-08-22 DIAGNOSIS — D649 Anemia, unspecified: Secondary | ICD-10-CM | POA: Diagnosis not present

## 2023-08-22 DIAGNOSIS — I2510 Atherosclerotic heart disease of native coronary artery without angina pectoris: Secondary | ICD-10-CM | POA: Diagnosis not present

## 2023-08-22 DIAGNOSIS — Z9181 History of falling: Secondary | ICD-10-CM | POA: Diagnosis not present

## 2023-08-22 DIAGNOSIS — I4891 Unspecified atrial fibrillation: Secondary | ICD-10-CM | POA: Diagnosis not present

## 2023-08-22 DIAGNOSIS — Z7982 Long term (current) use of aspirin: Secondary | ICD-10-CM | POA: Diagnosis not present

## 2023-08-25 DIAGNOSIS — Z9181 History of falling: Secondary | ICD-10-CM | POA: Diagnosis not present

## 2023-08-25 DIAGNOSIS — I4891 Unspecified atrial fibrillation: Secondary | ICD-10-CM | POA: Diagnosis not present

## 2023-08-25 DIAGNOSIS — L97422 Non-pressure chronic ulcer of left heel and midfoot with fat layer exposed: Secondary | ICD-10-CM | POA: Diagnosis not present

## 2023-08-25 DIAGNOSIS — Z791 Long term (current) use of non-steroidal anti-inflammatories (NSAID): Secondary | ICD-10-CM | POA: Diagnosis not present

## 2023-08-25 DIAGNOSIS — I872 Venous insufficiency (chronic) (peripheral): Secondary | ICD-10-CM | POA: Diagnosis not present

## 2023-08-25 DIAGNOSIS — D649 Anemia, unspecified: Secondary | ICD-10-CM | POA: Diagnosis not present

## 2023-08-25 DIAGNOSIS — I5022 Chronic systolic (congestive) heart failure: Secondary | ICD-10-CM | POA: Diagnosis not present

## 2023-08-25 DIAGNOSIS — Z7982 Long term (current) use of aspirin: Secondary | ICD-10-CM | POA: Diagnosis not present

## 2023-08-25 DIAGNOSIS — E569 Vitamin deficiency, unspecified: Secondary | ICD-10-CM | POA: Diagnosis not present

## 2023-08-25 DIAGNOSIS — I739 Peripheral vascular disease, unspecified: Secondary | ICD-10-CM | POA: Diagnosis not present

## 2023-08-25 DIAGNOSIS — I110 Hypertensive heart disease with heart failure: Secondary | ICD-10-CM | POA: Diagnosis not present

## 2023-08-25 DIAGNOSIS — I2510 Atherosclerotic heart disease of native coronary artery without angina pectoris: Secondary | ICD-10-CM | POA: Diagnosis not present

## 2023-08-25 DIAGNOSIS — Z556 Problems related to health literacy: Secondary | ICD-10-CM | POA: Diagnosis not present

## 2023-08-25 DIAGNOSIS — L97812 Non-pressure chronic ulcer of other part of right lower leg with fat layer exposed: Secondary | ICD-10-CM | POA: Diagnosis not present

## 2023-08-25 DIAGNOSIS — Z7902 Long term (current) use of antithrombotics/antiplatelets: Secondary | ICD-10-CM | POA: Diagnosis not present

## 2023-08-26 DIAGNOSIS — L97422 Non-pressure chronic ulcer of left heel and midfoot with fat layer exposed: Secondary | ICD-10-CM | POA: Diagnosis not present

## 2023-08-26 DIAGNOSIS — I87313 Chronic venous hypertension (idiopathic) with ulcer of bilateral lower extremity: Secondary | ICD-10-CM | POA: Diagnosis not present

## 2023-08-26 DIAGNOSIS — L97911 Non-pressure chronic ulcer of unspecified part of right lower leg limited to breakdown of skin: Secondary | ICD-10-CM | POA: Diagnosis not present

## 2023-08-27 ENCOUNTER — Encounter: Payer: Self-pay | Admitting: Family

## 2023-08-27 ENCOUNTER — Ambulatory Visit (INDEPENDENT_AMBULATORY_CARE_PROVIDER_SITE_OTHER): Payer: 59 | Admitting: Family

## 2023-08-27 VITALS — BP 180/90 | HR 99 | Wt 103.2 lb

## 2023-08-27 DIAGNOSIS — I25118 Atherosclerotic heart disease of native coronary artery with other forms of angina pectoris: Secondary | ICD-10-CM

## 2023-08-27 DIAGNOSIS — I739 Peripheral vascular disease, unspecified: Secondary | ICD-10-CM | POA: Diagnosis not present

## 2023-08-27 DIAGNOSIS — I872 Venous insufficiency (chronic) (peripheral): Secondary | ICD-10-CM | POA: Diagnosis not present

## 2023-08-27 DIAGNOSIS — L97812 Non-pressure chronic ulcer of other part of right lower leg with fat layer exposed: Secondary | ICD-10-CM | POA: Diagnosis not present

## 2023-08-27 DIAGNOSIS — I10 Essential (primary) hypertension: Secondary | ICD-10-CM

## 2023-08-27 DIAGNOSIS — I482 Chronic atrial fibrillation, unspecified: Secondary | ICD-10-CM | POA: Diagnosis not present

## 2023-08-27 DIAGNOSIS — G990 Autonomic neuropathy in diseases classified elsewhere: Secondary | ICD-10-CM

## 2023-08-27 DIAGNOSIS — L97422 Non-pressure chronic ulcer of left heel and midfoot with fat layer exposed: Secondary | ICD-10-CM | POA: Diagnosis not present

## 2023-08-27 MED ORDER — METOPROLOL SUCCINATE ER 25 MG PO TB24: 25.0000 mg | ORAL_TABLET | Freq: Every day | ORAL | 1 refills | Status: DC

## 2023-08-27 MED ORDER — LISINOPRIL 10 MG PO TABS: 10.0000 mg | ORAL_TABLET | Freq: Every day | ORAL | 1 refills | Status: DC

## 2023-08-27 MED ORDER — GABAPENTIN 400 MG PO CAPS: 400.0000 mg | ORAL_CAPSULE | Freq: Four times a day (QID) | ORAL | 1 refills | Status: DC

## 2023-08-27 MED ORDER — CLOPIDOGREL BISULFATE 75 MG PO TABS: 75.0000 mg | ORAL_TABLET | Freq: Every day | ORAL | 1 refills | Status: DC

## 2023-08-27 NOTE — Progress Notes (Signed)
Established Patient Office Visit  Subjective:  Patient ID: [name] [name], female    DOB: Sep 05, 1935  Age: 88 y.o. MRN: [id number]  Chief Complaint  Patient presents with   Follow-up    Patient is here today for her 1 month follow up.  She has been feeling fairly well since last appointment.   She does have additional concerns to discuss today.   Labs are not due today. She needs refills.   I have reviewed her active problem list, medication list, allergies, notes from last encounter, lab results for her appointment today.     No other concerns at this time.   Past Medical History:  Diagnosis Date   AKI (acute kidney injury) (HCC) 02/20/2023   Atrial fibrillation (HCC)    Basal cell carcinoma 2021   L temple   Chronic back pain    Congestive heart failure (CHF) (HCC)    History of total hip replacement (Bilateral) 01/12/2020   Left hip replacement done in 2019.  Right hip replacement done in 2012.   Hypertension    Hyponatremia 06/11/2020   Melena 02/21/2023   NSTEMI (non-ST elevated myocardial infarction) (HCC) 06/11/2020   Pharmacologic therapy 01/12/2020   Problems influencing health status 01/12/2020   Scoliosis    Upper GI bleeding 02/21/2023    Past Surgical History:  Procedure Laterality Date   ABDOMINAL SURGERY     bilateral hip replacement     CORONARY STENT INTERVENTION N/A 06/12/2020   Procedure: CORONARY STENT INTERVENTION;  Surgeon: Iran Ouch, MD;  Location: [facility];  Service: Cardiovascular;  Laterality: N/A;  LAD   ESOPHAGOGASTRODUODENOSCOPY (EGD) WITH PROPOFOL N/A 02/24/2023   Procedure: ESOPHAGOGASTRODUODENOSCOPY (EGD) WITH PROPOFOL;  Surgeon: Midge Minium, MD;  Location: [facility];  Service: Endoscopy;  Laterality: N/A;   LEFT HEART CATH AND CORONARY ANGIOGRAPHY N/A 06/12/2020   Procedure: LEFT HEART CATH AND CORONARY ANGIOGRAPHY;  Surgeon: Iran Ouch, MD;  Location: [facility];  Service: Cardiovascular;   Laterality: N/A;   LOWER EXTREMITY ANGIOGRAPHY Right 02/25/2023   Procedure: Lower Extremity Angiography;  Surgeon: Renford Dills, MD;  Location: [facility];  Service: Cardiovascular;  Laterality: Right;   TONSILLECTOMY      Social History   Socioeconomic History   Marital status: Single    Spouse name: Not on file   Number of children: 0   Years of education: Not on file   Highest education level: Not on file  Occupational History   Not on file  Tobacco Use   Smoking status: Former    Current packs/day: 0.00    Average packs/day: 0.5 packs/day for 68.0 years (34.0 ttl pk-yrs)    Types: Cigarettes    Start date: 06/11/1952    Quit date: 06/11/2020    Years since quitting: 3.3   Smokeless tobacco: Never  Substance and Sexual Activity   Alcohol use: Not Currently   Drug use: Not Currently   Sexual activity: Not on file  Other Topics Concern   Not on file  Social History Narrative   Not on file   Social Determinants of Health   Financial Resource Strain: Not on file  Food Insecurity: Not on file  Transportation Needs: No Transportation Needs (12/25/2022)   PRAPARE - Administrator, Civil Service (Medical): No    Lack of Transportation (Non-Medical): No  Physical Activity: Insufficiently Active (12/25/2022)   Exercise Vital Sign    Days of Exercise per Week: 7  days    Minutes of Exercise per Session: 20 min  Stress: Stress Concern Present (12/25/2022)   [employer] of Occupational Health - Occupational Stress Questionnaire    Feeling of Stress : Very much  Social Connections: Socially Isolated (12/25/2022)   Social Connection and Isolation Panel [NHANES]    Frequency of Communication with Friends and Family: Never    Frequency of Social Gatherings with Friends and Family: Never    Attends Religious Services: Never    Database administrator or Organizations: No    Attends Banker Meetings: Never    Marital Status: Never married   Intimate Partner Violence: Not At Risk (12/25/2022)   Humiliation, Afraid, Rape, and Kick questionnaire    Fear of Current or Ex-Partner: No    Emotionally Abused: No    Physically Abused: No    Sexually Abused: No    Family History  Problem Relation Age of Onset   CVA Father    Heart disease Father    Arthritis/Rheumatoid Sister     Allergies  Allergen Reactions   Spironolactone Anaphylaxis   Fluogen [Influenza Virus Vaccine] Hives   Statins     Dizziness & weakness   Sulfa Antibiotics Other (See Comments)    Unknown, happened as a child    Review of Systems  Musculoskeletal:  Positive for back pain, falls, joint pain and myalgias.  Skin:        wounds  All other systems reviewed and are negative.      Objective:   BP (!) 180/90   Pulse 99   Wt 103 lb 3.2 oz (46.8 kg)   SpO2 99%   BMI 16.16 kg/m   Vitals:   08/27/23 0905  BP: (!) 180/90  Pulse: 99  Weight: 103 lb 3.2 oz (46.8 kg)  SpO2: 99%    Physical Exam Vitals and nursing note reviewed.  Constitutional:      Appearance: Normal appearance. She is underweight.  HENT:     Head: Normocephalic.  Eyes:     Extraocular Movements: Extraocular movements intact.     Conjunctiva/sclera: Conjunctivae normal.     Pupils: Pupils are equal, round, and reactive to light.  Cardiovascular:     Rate and Rhythm: Normal rate.  Pulmonary:     Effort: Pulmonary effort is normal.  Skin:    Findings: Wound present.     Comments: Bilateral lower extremities.   Neurological:     General: No focal deficit present.     Mental Status: She is alert and oriented to person, place, and time. Mental status is at baseline.  Psychiatric:        Mood and Affect: Mood normal.        Behavior: Behavior normal.        Thought Content: Thought content normal.        Judgment: Judgment normal.      No results found for any visits on 08/27/23.  Recent Results (from the past 2160 hour(s))  Uric acid     Status: None    Collection Time: 10/01/23 11:19 AM  Result Value Ref Range   Uric Acid 4.9 3.1 - 7.9 mg/dL    Comment:            Therapeutic target for gout patients: <6.0  CBC with Diff     Status: Abnormal   Collection Time: 10/01/23 11:19 AM  Result Value Ref Range   WBC 7.5 3.4 - 10.8 x10E3/uL  Comment: **Effective October 13, 2023 profile 161096 WBC will be made**   non-orderable as a stand-alone order code.    RBC 3.64 (L) 3.77 - 5.28 x10E6/uL   Hemoglobin 10.6 (L) 11.1 - 15.9 g/dL   Hematocrit 04.5 (L) 40.9 - 46.6 %   MCV 93 79 - 97 fL   MCH 29.1 26.6 - 33.0 pg   MCHC 31.4 (L) 31.5 - 35.7 g/dL   RDW 81.1 (L) 91.4 - 78.2 %   Platelets 313 150 - 450 x10E3/uL   Neutrophils 82 Not Estab. %   Lymphs 11 Not Estab. %   Monocytes 7 Not Estab. %   Eos 0 Not Estab. %   Basos 0 Not Estab. %   Neutrophils Absolute 6.1 1.4 - 7.0 x10E3/uL   Lymphocytes Absolute 0.8 0.7 - 3.1 x10E3/uL   Monocytes Absolute 0.5 0.1 - 0.9 x10E3/uL   EOS (ABSOLUTE) 0.0 0.0 - 0.4 x10E3/uL   Basophils Absolute 0.0 0.0 - 0.2 x10E3/uL   Immature Granulocytes 0 Not Estab. %   Immature Grans (Abs) 0.0 0.0 - 0.1 x10E3/uL  Lipid panel     Status: None   Collection Time: 10/01/23 11:19 AM  Result Value Ref Range   Cholesterol, Total 179 100 - 199 mg/dL   Triglycerides 93 0 - 149 mg/dL   HDL 84 >95 mg/dL   VLDL Cholesterol Cal 17 5 - 40 mg/dL   LDL Chol Calc (NIH) 78 0 - 99 mg/dL   Chol/HDL Ratio 2.1 0.0 - 4.4 ratio    Comment:                                   T. Chol/HDL Ratio                                             Men  Women                               1/2 Avg.Risk  3.4    3.3                                   Avg.Risk  5.0    4.4                                2X Avg.Risk  9.6    7.1                                3X Avg.Risk 23.4   11.0   VITAMIN D 25 Hydroxy (Vit-D Deficiency, Fractures)     Status: None   Collection Time: 10/01/23 11:19 AM  Result Value Ref Range   Vit D, 25-Hydroxy 53.9 30.0 - 100.0  ng/mL    Comment: Vitamin D deficiency has been defined by the Institute of Medicine and an Endocrine Society practice guideline as a level of serum 25-OH vitamin D less than 20 ng/mL (1,2). The Endocrine Society went on to further define vitamin D insufficiency as a level between 21 and 29 ng/mL (2). 1. IOM (Institute of  Medicine). 2010. Dietary reference    intakes for calcium and D. [place]: The    [employer]. 2. Holick MF, Binkley Kirby, Bischoff-Ferrari HA, et al.    Evaluation, treatment, and prevention of vitamin D    deficiency: an Endocrine Society clinical practice    guideline. JCEM. 2011 Jul; 96(7):1911-30.   CMP14+EGFR     Status: Abnormal   Collection Time: 10/01/23 11:19 AM  Result Value Ref Range   Glucose 79 70 - 99 mg/dL   BUN 15 8 - 27 mg/dL   Creatinine, Ser 2.95 (H) 0.57 - 1.00 mg/dL   eGFR 54 (L) >62 ZH/YQM/5.78   BUN/Creatinine Ratio 15 12 - 28   Sodium 136 134 - 144 mmol/L   Potassium 4.7 3.5 - 5.2 mmol/L   Chloride 97 96 - 106 mmol/L   CO2 26 20 - 29 mmol/L   Calcium 9.0 8.7 - 10.3 mg/dL   Total Protein 6.9 6.0 - 8.5 g/dL   Albumin 4.3 3.7 - 4.7 g/dL   Globulin, Total 2.6 1.5 - 4.5 g/dL   Bilirubin Total 0.3 0.0 - 1.2 mg/dL   Alkaline Phosphatase 127 (H) 44 - 121 IU/L   AST 30 0 - 40 IU/L   ALT 22 0 - 32 IU/L  TSH     Status: Abnormal   Collection Time: 10/01/23 11:19 AM  Result Value Ref Range   TSH 0.324 (L) 0.450 - 4.500 uIU/mL  Hemoglobin A1c     Status: Abnormal   Collection Time: 10/01/23 11:19 AM  Result Value Ref Range   Hgb A1c MFr Bld 5.7 (H) 4.8 - 5.6 %    Comment:          Prediabetes: 5.7 - 6.4          Diabetes: >6.4          Glycemic control for adults with diabetes: <7.0    Est. average glucose Bld gHb Est-mCnc 117 mg/dL  Vitamin I69     Status: None   Collection Time: 10/01/23 11:19 AM  Result Value Ref Range   Vitamin B-12 1,166 232 - 1,245 pg/mL       Assessment & Plan:   Problem List Items Addressed  This Visit       Active Problems   Hypertension - Primary   Relevant Medications   lisinopril (ZESTRIL) 10 MG tablet   Atrial fibrillation, chronic (HCC)    Patient is seen by Cardiology, who manage this condition.  She is well controlled with current therapy.   Will defer to them for further changes to plan of care.       Relevant Medications   lisinopril (ZESTRIL) 10 MG tablet   Coronary artery disease of native artery of native heart with stable angina pectoris [facility])    Patient is seen by Cardiology, who manage this condition.  She is well controlled with current therapy.   Will defer to them for further changes to plan of care.       Relevant Medications   lisinopril (ZESTRIL) 10 MG tablet   PVD (peripheral vascular disease) (HCC)    Patient is seen by Vascular, who manage this condition.  She is well controlled with current therapy.   Will defer to them for further changes to plan of care.       Relevant Medications   lisinopril (ZESTRIL) 10 MG tablet   Autonomic neuropathy in diseases classified elsewhere    Patient stable.  Well controlled with current  therapy.   Continue current meds.        Return in about 1 month (around 09/27/2023) for F/U.   Total time spent: 20 minutes  Miki Kins, FNP  08/27/2023   This document may have been prepared by [facility] Voice Recognition software and as such may include unintentional dictation errors.

## 2023-08-28 DIAGNOSIS — L97422 Non-pressure chronic ulcer of left heel and midfoot with fat layer exposed: Secondary | ICD-10-CM | POA: Diagnosis not present

## 2023-08-28 DIAGNOSIS — Z7902 Long term (current) use of antithrombotics/antiplatelets: Secondary | ICD-10-CM | POA: Diagnosis not present

## 2023-08-28 DIAGNOSIS — I5022 Chronic systolic (congestive) heart failure: Secondary | ICD-10-CM | POA: Diagnosis not present

## 2023-08-28 DIAGNOSIS — I739 Peripheral vascular disease, unspecified: Secondary | ICD-10-CM | POA: Diagnosis not present

## 2023-08-28 DIAGNOSIS — I110 Hypertensive heart disease with heart failure: Secondary | ICD-10-CM | POA: Diagnosis not present

## 2023-08-28 DIAGNOSIS — E569 Vitamin deficiency, unspecified: Secondary | ICD-10-CM | POA: Diagnosis not present

## 2023-08-28 DIAGNOSIS — D649 Anemia, unspecified: Secondary | ICD-10-CM | POA: Diagnosis not present

## 2023-08-28 DIAGNOSIS — I2510 Atherosclerotic heart disease of native coronary artery without angina pectoris: Secondary | ICD-10-CM | POA: Diagnosis not present

## 2023-08-28 DIAGNOSIS — Z7982 Long term (current) use of aspirin: Secondary | ICD-10-CM | POA: Diagnosis not present

## 2023-08-28 DIAGNOSIS — Z9181 History of falling: Secondary | ICD-10-CM | POA: Diagnosis not present

## 2023-08-28 DIAGNOSIS — L97812 Non-pressure chronic ulcer of other part of right lower leg with fat layer exposed: Secondary | ICD-10-CM | POA: Diagnosis not present

## 2023-08-28 DIAGNOSIS — I4891 Unspecified atrial fibrillation: Secondary | ICD-10-CM | POA: Diagnosis not present

## 2023-08-28 DIAGNOSIS — Z556 Problems related to health literacy: Secondary | ICD-10-CM | POA: Diagnosis not present

## 2023-08-28 DIAGNOSIS — Z791 Long term (current) use of non-steroidal anti-inflammatories (NSAID): Secondary | ICD-10-CM | POA: Diagnosis not present

## 2023-08-28 DIAGNOSIS — I872 Venous insufficiency (chronic) (peripheral): Secondary | ICD-10-CM | POA: Diagnosis not present

## 2023-09-01 DIAGNOSIS — Z7982 Long term (current) use of aspirin: Secondary | ICD-10-CM | POA: Diagnosis not present

## 2023-09-01 DIAGNOSIS — L97812 Non-pressure chronic ulcer of other part of right lower leg with fat layer exposed: Secondary | ICD-10-CM | POA: Diagnosis not present

## 2023-09-01 DIAGNOSIS — Z7902 Long term (current) use of antithrombotics/antiplatelets: Secondary | ICD-10-CM | POA: Diagnosis not present

## 2023-09-01 DIAGNOSIS — Z9181 History of falling: Secondary | ICD-10-CM | POA: Diagnosis not present

## 2023-09-01 DIAGNOSIS — Z791 Long term (current) use of non-steroidal anti-inflammatories (NSAID): Secondary | ICD-10-CM | POA: Diagnosis not present

## 2023-09-01 DIAGNOSIS — Z556 Problems related to health literacy: Secondary | ICD-10-CM | POA: Diagnosis not present

## 2023-09-01 DIAGNOSIS — I4891 Unspecified atrial fibrillation: Secondary | ICD-10-CM | POA: Diagnosis not present

## 2023-09-01 DIAGNOSIS — E569 Vitamin deficiency, unspecified: Secondary | ICD-10-CM | POA: Diagnosis not present

## 2023-09-01 DIAGNOSIS — I5022 Chronic systolic (congestive) heart failure: Secondary | ICD-10-CM | POA: Diagnosis not present

## 2023-09-01 DIAGNOSIS — I110 Hypertensive heart disease with heart failure: Secondary | ICD-10-CM | POA: Diagnosis not present

## 2023-09-01 DIAGNOSIS — I739 Peripheral vascular disease, unspecified: Secondary | ICD-10-CM | POA: Diagnosis not present

## 2023-09-01 DIAGNOSIS — I872 Venous insufficiency (chronic) (peripheral): Secondary | ICD-10-CM | POA: Diagnosis not present

## 2023-09-01 DIAGNOSIS — I2510 Atherosclerotic heart disease of native coronary artery without angina pectoris: Secondary | ICD-10-CM | POA: Diagnosis not present

## 2023-09-01 DIAGNOSIS — L97422 Non-pressure chronic ulcer of left heel and midfoot with fat layer exposed: Secondary | ICD-10-CM | POA: Diagnosis not present

## 2023-09-01 DIAGNOSIS — D649 Anemia, unspecified: Secondary | ICD-10-CM | POA: Diagnosis not present

## 2023-09-03 DIAGNOSIS — I739 Peripheral vascular disease, unspecified: Secondary | ICD-10-CM | POA: Diagnosis not present

## 2023-09-03 DIAGNOSIS — Z556 Problems related to health literacy: Secondary | ICD-10-CM | POA: Diagnosis not present

## 2023-09-03 DIAGNOSIS — E569 Vitamin deficiency, unspecified: Secondary | ICD-10-CM | POA: Diagnosis not present

## 2023-09-03 DIAGNOSIS — L97422 Non-pressure chronic ulcer of left heel and midfoot with fat layer exposed: Secondary | ICD-10-CM | POA: Diagnosis not present

## 2023-09-03 DIAGNOSIS — Z9181 History of falling: Secondary | ICD-10-CM | POA: Diagnosis not present

## 2023-09-03 DIAGNOSIS — I4891 Unspecified atrial fibrillation: Secondary | ICD-10-CM | POA: Diagnosis not present

## 2023-09-03 DIAGNOSIS — I2510 Atherosclerotic heart disease of native coronary artery without angina pectoris: Secondary | ICD-10-CM | POA: Diagnosis not present

## 2023-09-03 DIAGNOSIS — I5022 Chronic systolic (congestive) heart failure: Secondary | ICD-10-CM | POA: Diagnosis not present

## 2023-09-03 DIAGNOSIS — D649 Anemia, unspecified: Secondary | ICD-10-CM | POA: Diagnosis not present

## 2023-09-03 DIAGNOSIS — I110 Hypertensive heart disease with heart failure: Secondary | ICD-10-CM | POA: Diagnosis not present

## 2023-09-03 DIAGNOSIS — Z791 Long term (current) use of non-steroidal anti-inflammatories (NSAID): Secondary | ICD-10-CM | POA: Diagnosis not present

## 2023-09-03 DIAGNOSIS — L97812 Non-pressure chronic ulcer of other part of right lower leg with fat layer exposed: Secondary | ICD-10-CM | POA: Diagnosis not present

## 2023-09-03 DIAGNOSIS — Z7982 Long term (current) use of aspirin: Secondary | ICD-10-CM | POA: Diagnosis not present

## 2023-09-03 DIAGNOSIS — Z7902 Long term (current) use of antithrombotics/antiplatelets: Secondary | ICD-10-CM | POA: Diagnosis not present

## 2023-09-03 DIAGNOSIS — I872 Venous insufficiency (chronic) (peripheral): Secondary | ICD-10-CM | POA: Diagnosis not present

## 2023-09-08 ENCOUNTER — Telehealth: Payer: Self-pay | Admitting: Family

## 2023-09-08 DIAGNOSIS — Z9181 History of falling: Secondary | ICD-10-CM | POA: Diagnosis not present

## 2023-09-08 DIAGNOSIS — D649 Anemia, unspecified: Secondary | ICD-10-CM | POA: Diagnosis not present

## 2023-09-08 DIAGNOSIS — I739 Peripheral vascular disease, unspecified: Secondary | ICD-10-CM | POA: Diagnosis not present

## 2023-09-08 DIAGNOSIS — Z791 Long term (current) use of non-steroidal anti-inflammatories (NSAID): Secondary | ICD-10-CM | POA: Diagnosis not present

## 2023-09-08 DIAGNOSIS — I5022 Chronic systolic (congestive) heart failure: Secondary | ICD-10-CM | POA: Diagnosis not present

## 2023-09-08 DIAGNOSIS — E569 Vitamin deficiency, unspecified: Secondary | ICD-10-CM | POA: Diagnosis not present

## 2023-09-08 DIAGNOSIS — Z7982 Long term (current) use of aspirin: Secondary | ICD-10-CM | POA: Diagnosis not present

## 2023-09-08 DIAGNOSIS — Z556 Problems related to health literacy: Secondary | ICD-10-CM | POA: Diagnosis not present

## 2023-09-08 DIAGNOSIS — I872 Venous insufficiency (chronic) (peripheral): Secondary | ICD-10-CM | POA: Diagnosis not present

## 2023-09-08 DIAGNOSIS — I2510 Atherosclerotic heart disease of native coronary artery without angina pectoris: Secondary | ICD-10-CM | POA: Diagnosis not present

## 2023-09-08 DIAGNOSIS — I4891 Unspecified atrial fibrillation: Secondary | ICD-10-CM | POA: Diagnosis not present

## 2023-09-08 DIAGNOSIS — L97812 Non-pressure chronic ulcer of other part of right lower leg with fat layer exposed: Secondary | ICD-10-CM | POA: Diagnosis not present

## 2023-09-08 DIAGNOSIS — I110 Hypertensive heart disease with heart failure: Secondary | ICD-10-CM | POA: Diagnosis not present

## 2023-09-08 DIAGNOSIS — L97422 Non-pressure chronic ulcer of left heel and midfoot with fat layer exposed: Secondary | ICD-10-CM | POA: Diagnosis not present

## 2023-09-08 DIAGNOSIS — Z7902 Long term (current) use of antithrombotics/antiplatelets: Secondary | ICD-10-CM | POA: Diagnosis not present

## 2023-09-08 NOTE — Telephone Encounter (Signed)
Pt lvm wanting you to know that she did not get the eye surgery, the place called her 2 hours before to cancel & stated that they havent called her back yet

## 2023-09-09 DIAGNOSIS — L97812 Non-pressure chronic ulcer of other part of right lower leg with fat layer exposed: Secondary | ICD-10-CM | POA: Diagnosis not present

## 2023-09-09 DIAGNOSIS — I87313 Chronic venous hypertension (idiopathic) with ulcer of bilateral lower extremity: Secondary | ICD-10-CM | POA: Diagnosis not present

## 2023-09-09 DIAGNOSIS — L97829 Non-pressure chronic ulcer of other part of left lower leg with unspecified severity: Secondary | ICD-10-CM | POA: Diagnosis not present

## 2023-09-10 DIAGNOSIS — Z9181 History of falling: Secondary | ICD-10-CM | POA: Diagnosis not present

## 2023-09-10 DIAGNOSIS — L97422 Non-pressure chronic ulcer of left heel and midfoot with fat layer exposed: Secondary | ICD-10-CM | POA: Diagnosis not present

## 2023-09-10 DIAGNOSIS — I4891 Unspecified atrial fibrillation: Secondary | ICD-10-CM | POA: Diagnosis not present

## 2023-09-10 DIAGNOSIS — I872 Venous insufficiency (chronic) (peripheral): Secondary | ICD-10-CM | POA: Diagnosis not present

## 2023-09-10 DIAGNOSIS — I739 Peripheral vascular disease, unspecified: Secondary | ICD-10-CM | POA: Diagnosis not present

## 2023-09-10 DIAGNOSIS — Z791 Long term (current) use of non-steroidal anti-inflammatories (NSAID): Secondary | ICD-10-CM | POA: Diagnosis not present

## 2023-09-10 DIAGNOSIS — Z7902 Long term (current) use of antithrombotics/antiplatelets: Secondary | ICD-10-CM | POA: Diagnosis not present

## 2023-09-10 DIAGNOSIS — L97812 Non-pressure chronic ulcer of other part of right lower leg with fat layer exposed: Secondary | ICD-10-CM | POA: Diagnosis not present

## 2023-09-10 DIAGNOSIS — Z556 Problems related to health literacy: Secondary | ICD-10-CM | POA: Diagnosis not present

## 2023-09-10 DIAGNOSIS — Z7982 Long term (current) use of aspirin: Secondary | ICD-10-CM | POA: Diagnosis not present

## 2023-09-10 DIAGNOSIS — I2510 Atherosclerotic heart disease of native coronary artery without angina pectoris: Secondary | ICD-10-CM | POA: Diagnosis not present

## 2023-09-10 DIAGNOSIS — I5022 Chronic systolic (congestive) heart failure: Secondary | ICD-10-CM | POA: Diagnosis not present

## 2023-09-10 DIAGNOSIS — I110 Hypertensive heart disease with heart failure: Secondary | ICD-10-CM | POA: Diagnosis not present

## 2023-09-10 DIAGNOSIS — D649 Anemia, unspecified: Secondary | ICD-10-CM | POA: Diagnosis not present

## 2023-09-10 DIAGNOSIS — E569 Vitamin deficiency, unspecified: Secondary | ICD-10-CM | POA: Diagnosis not present

## 2023-09-12 ENCOUNTER — Other Ambulatory Visit: Payer: Self-pay | Admitting: Family

## 2023-09-12 DIAGNOSIS — I25118 Atherosclerotic heart disease of native coronary artery with other forms of angina pectoris: Secondary | ICD-10-CM

## 2023-09-12 DIAGNOSIS — G990 Autonomic neuropathy in diseases classified elsewhere: Secondary | ICD-10-CM

## 2023-09-12 DIAGNOSIS — I482 Chronic atrial fibrillation, unspecified: Secondary | ICD-10-CM

## 2023-09-16 ENCOUNTER — Ambulatory Visit (INDEPENDENT_AMBULATORY_CARE_PROVIDER_SITE_OTHER): Payer: 59 | Admitting: Vascular Surgery

## 2023-09-16 ENCOUNTER — Encounter (INDEPENDENT_AMBULATORY_CARE_PROVIDER_SITE_OTHER): Payer: Self-pay | Admitting: Vascular Surgery

## 2023-09-16 VITALS — BP 167/84 | HR 84 | Resp 15

## 2023-09-16 DIAGNOSIS — S81801D Unspecified open wound, right lower leg, subsequent encounter: Secondary | ICD-10-CM

## 2023-09-16 DIAGNOSIS — E43 Unspecified severe protein-calorie malnutrition: Secondary | ICD-10-CM | POA: Diagnosis not present

## 2023-09-16 DIAGNOSIS — I10 Essential (primary) hypertension: Secondary | ICD-10-CM | POA: Diagnosis not present

## 2023-09-16 NOTE — Assessment & Plan Note (Signed)
This impairs her wound healing ability.

## 2023-09-16 NOTE — Assessment & Plan Note (Signed)
blood pressure control important in reducing the progression of atherosclerotic disease. On appropriate oral medications.  

## 2023-09-16 NOTE — Assessment & Plan Note (Signed)
We are going to dress her wounds with Neosporin and light wraps to the lower legs.  She has not been able to tolerate Unna boots previously and these have not helped her.  She has had previous revascularization and her most recent study showed adequate blood flow.  These are extremely difficult wounds on both the right and left lower extremity to get to heal due to the location.  She also declines a wound care center referral as she has had previous experience with them and did not want to go back.  Follow-up in about a month.

## 2023-09-16 NOTE — Progress Notes (Signed)
MRN : [id number]  [name] [name] is a 88 y.o. (01-21-1935) female who presents with chief complaint of  Chief Complaint  Patient presents with   Follow-up    4 week wound check  .  History of Present Illness: Patient returns today in follow up of her lower extremity swelling and ulceration.  We have been doing Cellerate and lite wraps to her lower legs for chronic medial ankle ulcerations.  She has clear erosion over the malleolus on each side.  Mild surrounding erythema.  Marked stasis dermatitis changes are present bilaterally.  Current Outpatient Medications  Medication Sig Dispense Refill   aspirin EC 81 MG tablet Take 1 tablet (81 mg total) by mouth daily. Swallow whole. 30 tablet 0   clopidogrel (PLAVIX) 75 MG tablet TAKE 1 TABLET BY MOUTH ONCE DAILY 30 tablet 8   feeding supplement (ENSURE ENLIVE / ENSURE PLUS) LIQD Take 237 mLs by mouth 2 (two) times daily between meals. 237 mL 12   Ferrous Sulfate (IRON) 325 (65 Fe) MG TABS TAKE 1 TABLET BY MOUTH ONCE DAILY 30 tablet 8   gabapentin (NEURONTIN) 400 MG capsule TAKE 1 CAPSULE BY MOUTH FOUR TIMES DAILY 120 capsule 8   lisinopril (ZESTRIL) 10 MG tablet Take 1 tablet (10 mg total) by mouth daily. 90 tablet 1   lisinopril (ZESTRIL) 5 MG tablet TAKE 1 TABLET BY MOUTH ONCE DAILY 30 tablet 8   meloxicam (MOBIC) 15 MG tablet TAKE 1 TABLET BY MOUTH ONCE DAILY 30 tablet 8   metoprolol succinate (TOPROL-XL) 25 MG 24 hr tablet TAKE 1 TABLET BY MOUTH ONCE DAILY 30 tablet 8   oxyCODONE-acetaminophen (PERCOCET) 10-325 MG tablet Take 1 tablet by mouth every 6 (six) hours as needed. 120 tablet 0   terbinafine (LAMISIL) 250 MG tablet Take 250 mg by mouth daily.     No current facility-administered medications for this visit.    Past Medical History:  Diagnosis Date   AKI (acute kidney injury) (HCC) 02/20/2023   Atrial fibrillation (HCC)    Basal cell carcinoma 2021   L temple   Chronic back pain    Congestive heart failure (CHF) (HCC)     History of total hip replacement (Bilateral) 01/12/2020   Left hip replacement done in 2019.  Right hip replacement done in 2012.   Hypertension    Hyponatremia 06/11/2020   NSTEMI (non-ST elevated myocardial infarction) (HCC) 06/11/2020   Pharmacologic therapy 01/12/2020   Problems influencing health status 01/12/2020   Scoliosis     Past Surgical History:  Procedure Laterality Date   ABDOMINAL SURGERY     bilateral hip replacement     CORONARY STENT INTERVENTION N/A 06/12/2020   Procedure: CORONARY STENT INTERVENTION;  Surgeon: Iran Ouch, MD;  Location: [facility];  Service: Cardiovascular;  Laterality: N/A;  LAD   ESOPHAGOGASTRODUODENOSCOPY (EGD) WITH PROPOFOL N/A 02/24/2023   Procedure: ESOPHAGOGASTRODUODENOSCOPY (EGD) WITH PROPOFOL;  Surgeon: Midge Minium, MD;  Location: [facility];  Service: Endoscopy;  Laterality: N/A;   LEFT HEART CATH AND CORONARY ANGIOGRAPHY N/A 06/12/2020   Procedure: LEFT HEART CATH AND CORONARY ANGIOGRAPHY;  Surgeon: Iran Ouch, MD;  Location: [facility];  Service: Cardiovascular;  Laterality: N/A;   LOWER EXTREMITY ANGIOGRAPHY Right 02/25/2023   Procedure: Lower Extremity Angiography;  Surgeon: Renford Dills, MD;  Location: [facility];  Service: Cardiovascular;  Laterality: Right;   TONSILLECTOMY       Social History   Tobacco Use  Smoking status: Former    Current packs/day: 0.00    Average packs/day: 0.5 packs/day for 68.0 years (34.0 ttl pk-yrs)    Types: Cigarettes    Start date: 06/11/1952    Quit date: 06/11/2020    Years since quitting: 3.2   Smokeless tobacco: Never  Substance Use Topics   Alcohol use: Not Currently   Drug use: Not Currently      Family History  Problem Relation Age of Onset   CVA Father    Heart disease Father    Arthritis/Rheumatoid Sister      Allergies  Allergen Reactions   Spironolactone Anaphylaxis   Fluogen [Influenza Virus Vaccine] Hives   Statins      Dizziness & weakness   Sulfa Antibiotics Other (See Comments)    Unknown, happened as a child     REVIEW OF SYSTEMS (Negative unless checked)  Constitutional: [] Weight loss  [] Fever  [] Chills Cardiac: [] Chest pain   [] Chest pressure   [] Palpitations   [] Shortness of breath when laying flat   [] Shortness of breath at rest   [] Shortness of breath with exertion. Vascular:  [] Pain in legs with walking   [] Pain in legs at rest   [] Pain in legs when laying flat   [] Claudication   [] Pain in feet when walking  [] Pain in feet at rest  [] Pain in feet when laying flat   [] History of DVT   [] Phlebitis   [x] Swelling in legs   [] Varicose veins   [x] Non-healing ulcers Pulmonary:   [] Uses home oxygen   [] Productive cough   [] Hemoptysis   [] Wheeze  [] COPD   [] Asthma Neurologic:  [] Dizziness  [] Blackouts   [] Seizures   [] History of stroke   [] History of TIA  [] Aphasia   [] Temporary blindness   [] Dysphagia   [] Weakness or numbness in arms   [] Weakness or numbness in legs Musculoskeletal:  [] Arthritis   [] Joint swelling   [] Joint pain   [] Low back pain Hematologic:  [] Easy bruising  [] Easy bleeding   [] Hypercoagulable state   [] Anemic   Gastrointestinal:  [] Blood in stool   [] Vomiting blood  [] Gastroesophageal reflux/heartburn   [] Abdominal pain Genitourinary:  [] Chronic kidney disease   [] Difficult urination  [] Frequent urination  [] Burning with urination   [] Hematuria Skin:  [] Rashes   [x] Ulcers   [x] Wounds Psychological:  [] History of anxiety   []  History of major depression.  Physical Examination  BP (!) 167/84 (BP Location: Right Arm)   Pulse 84   Resp 15  Gen:  WD/WN, NAD Head: [place]/AT, + temporalis wasting. Ear/Nose/Throat: Hearing grossly intact, nares w/o erythema or drainage Eyes: Conjunctiva clear. Sclera non-icteric Neck: Supple.  Trachea midline Pulmonary:  Good air movement, no use of accessory muscles.  Cardiac: irregular Vascular:  Vessel Right Left  Radial Palpable Palpable                                    Musculoskeletal: M/S 5/5 throughout.  3 to 4 cm circular ulceration on the right medial malleolus at the ankle and a 2 to 3 cm circular ulceration on the left medial malleolus the ankle.  Mild surrounding erythema.  Marked stasis dermatitis with 1+ lower extremity edema bilaterally. Neurologic: Sensation grossly intact in extremities.  Symmetrical.  Speech is fluent.  Psychiatric: Judgment intact, Mood & affect appropriate for pt's clinical situation. Dermatologic: Lower extremity wounds as described above.      Labs No results found  for this or any previous visit (from the past 2160 hour(s)).  Radiology No results found.  Assessment/Plan  Open wound of lower leg, right, subsequent encounter We are going to dress her wounds with Neosporin and light wraps to the lower legs.  She has not been able to tolerate Unna boots previously and these have not helped her.  She has had previous revascularization and her most recent study showed adequate blood flow.  These are extremely difficult wounds on both the right and left lower extremity to get to heal due to the location.  She also declines a wound care center referral as she has had previous experience with them and did not want to go back.  Follow-up in about a month.  Protein-calorie malnutrition, severe (HCC) This impairs her wound healing ability.  Hypertension blood pressure control important in reducing the progression of atherosclerotic disease. On appropriate oral medications.    Festus Barren, MD  09/16/2023 4:34 PM    This note was created with Dragon medical transcription system.  Any errors from dictation are purely unintentional

## 2023-09-18 DIAGNOSIS — Z9181 History of falling: Secondary | ICD-10-CM | POA: Diagnosis not present

## 2023-09-18 DIAGNOSIS — Z7982 Long term (current) use of aspirin: Secondary | ICD-10-CM | POA: Diagnosis not present

## 2023-09-18 DIAGNOSIS — Z7902 Long term (current) use of antithrombotics/antiplatelets: Secondary | ICD-10-CM | POA: Diagnosis not present

## 2023-09-18 DIAGNOSIS — E569 Vitamin deficiency, unspecified: Secondary | ICD-10-CM | POA: Diagnosis not present

## 2023-09-18 DIAGNOSIS — Z791 Long term (current) use of non-steroidal anti-inflammatories (NSAID): Secondary | ICD-10-CM | POA: Diagnosis not present

## 2023-09-18 DIAGNOSIS — I872 Venous insufficiency (chronic) (peripheral): Secondary | ICD-10-CM | POA: Diagnosis not present

## 2023-09-18 DIAGNOSIS — L97812 Non-pressure chronic ulcer of other part of right lower leg with fat layer exposed: Secondary | ICD-10-CM | POA: Diagnosis not present

## 2023-09-18 DIAGNOSIS — I5022 Chronic systolic (congestive) heart failure: Secondary | ICD-10-CM | POA: Diagnosis not present

## 2023-09-18 DIAGNOSIS — D649 Anemia, unspecified: Secondary | ICD-10-CM | POA: Diagnosis not present

## 2023-09-18 DIAGNOSIS — Z556 Problems related to health literacy: Secondary | ICD-10-CM | POA: Diagnosis not present

## 2023-09-18 DIAGNOSIS — L97422 Non-pressure chronic ulcer of left heel and midfoot with fat layer exposed: Secondary | ICD-10-CM | POA: Diagnosis not present

## 2023-09-18 DIAGNOSIS — I2510 Atherosclerotic heart disease of native coronary artery without angina pectoris: Secondary | ICD-10-CM | POA: Diagnosis not present

## 2023-09-18 DIAGNOSIS — I4891 Unspecified atrial fibrillation: Secondary | ICD-10-CM | POA: Diagnosis not present

## 2023-09-18 DIAGNOSIS — I110 Hypertensive heart disease with heart failure: Secondary | ICD-10-CM | POA: Diagnosis not present

## 2023-09-18 DIAGNOSIS — I739 Peripheral vascular disease, unspecified: Secondary | ICD-10-CM | POA: Diagnosis not present

## 2023-09-22 DIAGNOSIS — Z7902 Long term (current) use of antithrombotics/antiplatelets: Secondary | ICD-10-CM | POA: Diagnosis not present

## 2023-09-22 DIAGNOSIS — E569 Vitamin deficiency, unspecified: Secondary | ICD-10-CM | POA: Diagnosis not present

## 2023-09-22 DIAGNOSIS — I110 Hypertensive heart disease with heart failure: Secondary | ICD-10-CM | POA: Diagnosis not present

## 2023-09-22 DIAGNOSIS — I2510 Atherosclerotic heart disease of native coronary artery without angina pectoris: Secondary | ICD-10-CM | POA: Diagnosis not present

## 2023-09-22 DIAGNOSIS — L97422 Non-pressure chronic ulcer of left heel and midfoot with fat layer exposed: Secondary | ICD-10-CM | POA: Diagnosis not present

## 2023-09-22 DIAGNOSIS — I5022 Chronic systolic (congestive) heart failure: Secondary | ICD-10-CM | POA: Diagnosis not present

## 2023-09-22 DIAGNOSIS — D649 Anemia, unspecified: Secondary | ICD-10-CM | POA: Diagnosis not present

## 2023-09-22 DIAGNOSIS — Z9181 History of falling: Secondary | ICD-10-CM | POA: Diagnosis not present

## 2023-09-22 DIAGNOSIS — Z7982 Long term (current) use of aspirin: Secondary | ICD-10-CM | POA: Diagnosis not present

## 2023-09-22 DIAGNOSIS — I739 Peripheral vascular disease, unspecified: Secondary | ICD-10-CM | POA: Diagnosis not present

## 2023-09-22 DIAGNOSIS — I872 Venous insufficiency (chronic) (peripheral): Secondary | ICD-10-CM | POA: Diagnosis not present

## 2023-09-22 DIAGNOSIS — I4891 Unspecified atrial fibrillation: Secondary | ICD-10-CM | POA: Diagnosis not present

## 2023-09-22 DIAGNOSIS — L97812 Non-pressure chronic ulcer of other part of right lower leg with fat layer exposed: Secondary | ICD-10-CM | POA: Diagnosis not present

## 2023-09-22 DIAGNOSIS — Z791 Long term (current) use of non-steroidal anti-inflammatories (NSAID): Secondary | ICD-10-CM | POA: Diagnosis not present

## 2023-09-22 DIAGNOSIS — Z556 Problems related to health literacy: Secondary | ICD-10-CM | POA: Diagnosis not present

## 2023-09-25 ENCOUNTER — Other Ambulatory Visit: Payer: Self-pay

## 2023-09-25 ENCOUNTER — Telehealth: Payer: Self-pay

## 2023-09-25 DIAGNOSIS — H02831 Dermatochalasis of right upper eyelid: Secondary | ICD-10-CM | POA: Diagnosis not present

## 2023-09-25 DIAGNOSIS — L578 Other skin changes due to chronic exposure to nonionizing radiation: Secondary | ICD-10-CM | POA: Diagnosis not present

## 2023-09-25 DIAGNOSIS — H02834 Dermatochalasis of left upper eyelid: Secondary | ICD-10-CM | POA: Diagnosis not present

## 2023-09-25 NOTE — Telephone Encounter (Signed)
Needs Oxycodone refill

## 2023-09-25 NOTE — Telephone Encounter (Signed)
Oxycodone filled 3 doses. 5 units. [facility] [address].

## 2023-09-26 DIAGNOSIS — Z791 Long term (current) use of non-steroidal anti-inflammatories (NSAID): Secondary | ICD-10-CM | POA: Diagnosis not present

## 2023-09-26 DIAGNOSIS — I872 Venous insufficiency (chronic) (peripheral): Secondary | ICD-10-CM | POA: Diagnosis not present

## 2023-09-26 DIAGNOSIS — I739 Peripheral vascular disease, unspecified: Secondary | ICD-10-CM | POA: Diagnosis not present

## 2023-09-26 DIAGNOSIS — I110 Hypertensive heart disease with heart failure: Secondary | ICD-10-CM | POA: Diagnosis not present

## 2023-09-26 DIAGNOSIS — Z9181 History of falling: Secondary | ICD-10-CM | POA: Diagnosis not present

## 2023-09-26 DIAGNOSIS — L97812 Non-pressure chronic ulcer of other part of right lower leg with fat layer exposed: Secondary | ICD-10-CM | POA: Diagnosis not present

## 2023-09-26 DIAGNOSIS — E569 Vitamin deficiency, unspecified: Secondary | ICD-10-CM | POA: Diagnosis not present

## 2023-09-26 DIAGNOSIS — Z556 Problems related to health literacy: Secondary | ICD-10-CM | POA: Diagnosis not present

## 2023-09-26 DIAGNOSIS — Z7902 Long term (current) use of antithrombotics/antiplatelets: Secondary | ICD-10-CM | POA: Diagnosis not present

## 2023-09-26 DIAGNOSIS — I4891 Unspecified atrial fibrillation: Secondary | ICD-10-CM | POA: Diagnosis not present

## 2023-09-26 DIAGNOSIS — I2510 Atherosclerotic heart disease of native coronary artery without angina pectoris: Secondary | ICD-10-CM | POA: Diagnosis not present

## 2023-09-26 DIAGNOSIS — I5022 Chronic systolic (congestive) heart failure: Secondary | ICD-10-CM | POA: Diagnosis not present

## 2023-09-26 DIAGNOSIS — L97422 Non-pressure chronic ulcer of left heel and midfoot with fat layer exposed: Secondary | ICD-10-CM | POA: Diagnosis not present

## 2023-09-26 DIAGNOSIS — Z7982 Long term (current) use of aspirin: Secondary | ICD-10-CM | POA: Diagnosis not present

## 2023-09-26 DIAGNOSIS — D649 Anemia, unspecified: Secondary | ICD-10-CM | POA: Diagnosis not present

## 2023-09-26 MED ORDER — OXYCODONE-ACETAMINOPHEN 10-325 MG PO TABS: 1.0000 | ORAL_TABLET | Freq: Four times a day (QID) | ORAL | 0 refills | Status: DC | PRN

## 2023-09-29 ENCOUNTER — Telehealth: Payer: Self-pay | Admitting: Family

## 2023-09-29 DIAGNOSIS — Z556 Problems related to health literacy: Secondary | ICD-10-CM | POA: Diagnosis not present

## 2023-09-29 DIAGNOSIS — L97812 Non-pressure chronic ulcer of other part of right lower leg with fat layer exposed: Secondary | ICD-10-CM | POA: Diagnosis not present

## 2023-09-29 DIAGNOSIS — I872 Venous insufficiency (chronic) (peripheral): Secondary | ICD-10-CM | POA: Diagnosis not present

## 2023-09-29 DIAGNOSIS — Z791 Long term (current) use of non-steroidal anti-inflammatories (NSAID): Secondary | ICD-10-CM | POA: Diagnosis not present

## 2023-09-29 DIAGNOSIS — I110 Hypertensive heart disease with heart failure: Secondary | ICD-10-CM | POA: Diagnosis not present

## 2023-09-29 DIAGNOSIS — I2510 Atherosclerotic heart disease of native coronary artery without angina pectoris: Secondary | ICD-10-CM | POA: Diagnosis not present

## 2023-09-29 DIAGNOSIS — I5022 Chronic systolic (congestive) heart failure: Secondary | ICD-10-CM | POA: Diagnosis not present

## 2023-09-29 DIAGNOSIS — E569 Vitamin deficiency, unspecified: Secondary | ICD-10-CM | POA: Diagnosis not present

## 2023-09-29 DIAGNOSIS — Z9181 History of falling: Secondary | ICD-10-CM | POA: Diagnosis not present

## 2023-09-29 DIAGNOSIS — D649 Anemia, unspecified: Secondary | ICD-10-CM | POA: Diagnosis not present

## 2023-09-29 DIAGNOSIS — L97422 Non-pressure chronic ulcer of left heel and midfoot with fat layer exposed: Secondary | ICD-10-CM | POA: Diagnosis not present

## 2023-09-29 DIAGNOSIS — I4891 Unspecified atrial fibrillation: Secondary | ICD-10-CM | POA: Diagnosis not present

## 2023-09-29 DIAGNOSIS — Z7982 Long term (current) use of aspirin: Secondary | ICD-10-CM | POA: Diagnosis not present

## 2023-09-29 DIAGNOSIS — I739 Peripheral vascular disease, unspecified: Secondary | ICD-10-CM | POA: Diagnosis not present

## 2023-09-29 DIAGNOSIS — Z7902 Long term (current) use of antithrombotics/antiplatelets: Secondary | ICD-10-CM | POA: Diagnosis not present

## 2023-09-29 NOTE — Telephone Encounter (Signed)
[name] [name] with [facility] called asking that [name] check the right great toe that is swollen and red.

## 2023-09-29 NOTE — Telephone Encounter (Signed)
[name] called back and states patient has a new wound between the great and second toe. Also has a skin tear on her left upper shin below the knee.   She advised patient to call vein and vascular as they have been managing her wound care but the patient said she is not returning there because they "terminated" her.

## 2023-10-01 ENCOUNTER — Encounter: Payer: Self-pay | Admitting: Family

## 2023-10-01 ENCOUNTER — Ambulatory Visit: Payer: 59 | Admitting: Family

## 2023-10-01 ENCOUNTER — Telehealth (INDEPENDENT_AMBULATORY_CARE_PROVIDER_SITE_OTHER): Payer: Self-pay

## 2023-10-01 VITALS — BP 150/100 | HR 93 | Ht 66.0 in | Wt 105.8 lb

## 2023-10-01 DIAGNOSIS — E559 Vitamin D deficiency, unspecified: Secondary | ICD-10-CM | POA: Diagnosis not present

## 2023-10-01 DIAGNOSIS — R040 Epistaxis: Secondary | ICD-10-CM | POA: Diagnosis not present

## 2023-10-01 DIAGNOSIS — R7303 Prediabetes: Secondary | ICD-10-CM

## 2023-10-01 DIAGNOSIS — I10 Essential (primary) hypertension: Secondary | ICD-10-CM | POA: Diagnosis not present

## 2023-10-01 DIAGNOSIS — E782 Mixed hyperlipidemia: Secondary | ICD-10-CM

## 2023-10-01 DIAGNOSIS — R5383 Other fatigue: Secondary | ICD-10-CM | POA: Diagnosis not present

## 2023-10-01 DIAGNOSIS — M7989 Other specified soft tissue disorders: Secondary | ICD-10-CM | POA: Diagnosis not present

## 2023-10-01 DIAGNOSIS — E538 Deficiency of other specified B group vitamins: Secondary | ICD-10-CM

## 2023-10-01 NOTE — Telephone Encounter (Signed)
Patient sister left a message asking if her sister will need to keep her follow up appointment. I left a message on her voicemail that her should follow up in a month per last note.

## 2023-10-01 NOTE — Progress Notes (Signed)
Established Patient Office Visit  Subjective:  Patient ID: [name] [name], female    DOB: 12-28-1934  Age: 88 y.o. MRN: [id number]  Chief Complaint  Patient presents with   Follow-up    1 month    Patient is here today for her 1 month follow up.  She has been feeling fairly well since last appointment.   She does have additional concerns to discuss today.   Labs are not due today. She needs refills.   I have reviewed her active problem list, medication list, allergies, notes from last encounter, lab results for her appointment today.     No other concerns at this time.   Past Medical History:  Diagnosis Date   AKI (acute kidney injury) (HCC) 02/20/2023   Atrial fibrillation (HCC)    Basal cell carcinoma 2021   L temple   Chronic back pain    Congestive heart failure (CHF) (HCC)    History of total hip replacement (Bilateral) 01/12/2020   Left hip replacement done in 2019.  Right hip replacement done in 2012.   Hypertension    Hyponatremia 06/11/2020   Melena 02/21/2023   NSTEMI (non-ST elevated myocardial infarction) (HCC) 06/11/2020   Pharmacologic therapy 01/12/2020   Problems influencing health status 01/12/2020   Scoliosis    Upper GI bleeding 02/21/2023    Past Surgical History:  Procedure Laterality Date   ABDOMINAL SURGERY     bilateral hip replacement     CORONARY STENT INTERVENTION N/A 06/12/2020   Procedure: CORONARY STENT INTERVENTION;  Surgeon: Iran Ouch, MD;  Location: [facility];  Service: Cardiovascular;  Laterality: N/A;  LAD   ESOPHAGOGASTRODUODENOSCOPY (EGD) WITH PROPOFOL N/A 02/24/2023   Procedure: ESOPHAGOGASTRODUODENOSCOPY (EGD) WITH PROPOFOL;  Surgeon: Midge Minium, MD;  Location: [facility];  Service: Endoscopy;  Laterality: N/A;   LEFT HEART CATH AND CORONARY ANGIOGRAPHY N/A 06/12/2020   Procedure: LEFT HEART CATH AND CORONARY ANGIOGRAPHY;  Surgeon: Iran Ouch, MD;  Location: [facility];  Service:  Cardiovascular;  Laterality: N/A;   LOWER EXTREMITY ANGIOGRAPHY Right 02/25/2023   Procedure: Lower Extremity Angiography;  Surgeon: Renford Dills, MD;  Location: [facility];  Service: Cardiovascular;  Laterality: Right;   TONSILLECTOMY      Social History   Socioeconomic History   Marital status: Single    Spouse name: Not on file   Number of children: 0   Years of education: Not on file   Highest education level: Not on file  Occupational History   Not on file  Tobacco Use   Smoking status: Former    Current packs/day: 0.00    Average packs/day: 0.5 packs/day for 68.0 years (34.0 ttl pk-yrs)    Types: Cigarettes    Start date: 06/11/1952    Quit date: 06/11/2020    Years since quitting: 3.4   Smokeless tobacco: Never  Substance and Sexual Activity   Alcohol use: Not Currently   Drug use: Not Currently   Sexual activity: Not on file  Other Topics Concern   Not on file  Social History Narrative   Not on file   Social Drivers of Health   Financial Resource Strain: Not on file  Food Insecurity: No Food Insecurity (10/29/2023)   Hunger Vital Sign    Worried About Running Out of Food in the Last Year: Never true    Ran Out of Food in the Last Year: Never true  Transportation Needs: No Transportation Needs (10/29/2023)  PRAPARE - Administrator, Civil Service (Medical): No    Lack of Transportation (Non-Medical): No  Physical Activity: Insufficiently Active (12/25/2022)   Exercise Vital Sign    Days of Exercise per Week: 7 days    Minutes of Exercise per Session: 20 min  Stress: Stress Concern Present (12/25/2022)   [employer] of Occupational Health - Occupational Stress Questionnaire    Feeling of Stress : Very much  Social Connections: Socially Isolated (12/25/2022)   Social Connection and Isolation Panel [NHANES]    Frequency of Communication with Friends and Family: Never    Frequency of Social Gatherings with Friends and Family: Never     Attends Religious Services: Never    Database administrator or Organizations: No    Attends Banker Meetings: Never    Marital Status: Never married  Intimate Partner Violence: Not At Risk (10/29/2023)   Humiliation, Afraid, Rape, and Kick questionnaire    Fear of Current or Ex-Partner: No    Emotionally Abused: No    Physically Abused: No    Sexually Abused: No    Family History  Problem Relation Age of Onset   Breast cancer Mother    CVA Father    Heart disease Father    Arthritis/Rheumatoid Sister    Breast cancer Paternal Grandmother     Allergies  Allergen Reactions   Spironolactone Anaphylaxis   Fluogen [Influenza Virus Vaccine] Hives   Statins     Dizziness & weakness   Sulfa Antibiotics Other (See Comments)    Unknown, happened as a child    Review of Systems  Constitutional:  Positive for malaise/fatigue.  Skin:        Wounds to bilateral lower legs.  Neurological:  Positive for weakness.       Objective:   BP (!) 150/100   Pulse 93   Ht 5\' 6"  (1.676 m)   Wt 105 lb 12.8 oz (48 kg)   SpO2 94%   BMI 17.08 kg/m   Vitals:   10/01/23 1022  BP: (!) 150/100  Pulse: 93  Height: 5\' 6"  (1.676 m)  Weight: 105 lb 12.8 oz (48 kg)  SpO2: 94%  BMI (Calculated): 17.08    Physical Exam Vitals and nursing note reviewed.  Constitutional:      Appearance: Normal appearance. She is normal weight.  HENT:     Head: Normocephalic.  Eyes:     Extraocular Movements: Extraocular movements intact.     Conjunctiva/sclera: Conjunctivae normal.     Pupils: Pupils are equal, round, and reactive to light.  Cardiovascular:     Rate and Rhythm: Normal rate.  Pulmonary:     Effort: Pulmonary effort is normal.  Musculoskeletal:     Comments: Severe kyphosis/scoliosis  Neurological:     General: No focal deficit present.     Mental Status: She is alert and oriented to person, place, and time. Mental status is at baseline.     Gait: Gait abnormal  (shuffling, uses walker.).  Psychiatric:        Mood and Affect: Mood normal.        Behavior: Behavior normal.        Thought Content: Thought content normal.      Results for orders placed or performed in visit on 10/01/23  Uric acid  Result Value Ref Range   Uric Acid 4.9 3.1 - 7.9 mg/dL  CBC with Diff  Result Value Ref Range   WBC 7.5 3.4 -  10.8 x10E3/uL   RBC 3.64 (L) 3.77 - 5.28 x10E6/uL   Hemoglobin 10.6 (L) 11.1 - 15.9 g/dL   Hematocrit 16.1 (L) 09.6 - 46.6 %   MCV 93 79 - 97 fL   MCH 29.1 26.6 - 33.0 pg   MCHC 31.4 (L) 31.5 - 35.7 g/dL   RDW 04.5 (L) 40.9 - 81.1 %   Platelets 313 150 - 450 x10E3/uL   Neutrophils 82 Not Estab. %   Lymphs 11 Not Estab. %   Monocytes 7 Not Estab. %   Eos 0 Not Estab. %   Basos 0 Not Estab. %   Neutrophils Absolute 6.1 1.4 - 7.0 x10E3/uL   Lymphocytes Absolute 0.8 0.7 - 3.1 x10E3/uL   Monocytes Absolute 0.5 0.1 - 0.9 x10E3/uL   EOS (ABSOLUTE) 0.0 0.0 - 0.4 x10E3/uL   Basophils Absolute 0.0 0.0 - 0.2 x10E3/uL   Immature Granulocytes 0 Not Estab. %   Immature Grans (Abs) 0.0 0.0 - 0.1 x10E3/uL  Lipid panel  Result Value Ref Range   Cholesterol, Total 179 100 - 199 mg/dL   Triglycerides 93 0 - 149 mg/dL   HDL 84 >91 mg/dL   VLDL Cholesterol Cal 17 5 - 40 mg/dL   LDL Chol Calc (NIH) 78 0 - 99 mg/dL   Chol/HDL Ratio 2.1 0.0 - 4.4 ratio  VITAMIN D 25 Hydroxy (Vit-D Deficiency, Fractures)  Result Value Ref Range   Vit D, 25-Hydroxy 53.9 30.0 - 100.0 ng/mL  CMP14+EGFR  Result Value Ref Range   Glucose 79 70 - 99 mg/dL   BUN 15 8 - 27 mg/dL   Creatinine, Ser 4.78 (H) 0.57 - 1.00 mg/dL   eGFR 54 (L) >29 FA/OZH/0.86   BUN/Creatinine Ratio 15 12 - 28   Sodium 136 134 - 144 mmol/L   Potassium 4.7 3.5 - 5.2 mmol/L   Chloride 97 96 - 106 mmol/L   CO2 26 20 - 29 mmol/L   Calcium 9.0 8.7 - 10.3 mg/dL   Total Protein 6.9 6.0 - 8.5 g/dL   Albumin 4.3 3.7 - 4.7 g/dL   Globulin, Total 2.6 1.5 - 4.5 g/dL   Bilirubin Total 0.3 0.0 - 1.2  mg/dL   Alkaline Phosphatase 127 (H) 44 - 121 IU/L   AST 30 0 - 40 IU/L   ALT 22 0 - 32 IU/L  TSH  Result Value Ref Range   TSH 0.324 (L) 0.450 - 4.500 uIU/mL  Hemoglobin A1c  Result Value Ref Range   Hgb A1c MFr Bld 5.7 (H) 4.8 - 5.6 %   Est. average glucose Bld gHb Est-mCnc 117 mg/dL  Vitamin V78  Result Value Ref Range   Vitamin B-12 1,166 232 - 1,245 pg/mL    Recent Results (from the past 2160 hours)  Uric acid     Status: None   Collection Time: 10/01/23 11:19 AM  Result Value Ref Range   Uric Acid 4.9 3.1 - 7.9 mg/dL    Comment:            Therapeutic target for gout patients: <6.0  CBC with Diff     Status: Abnormal   Collection Time: 10/01/23 11:19 AM  Result Value Ref Range   WBC 7.5 3.4 - 10.8 x10E3/uL    Comment: **Effective October 13, 2023 profile 005025 WBC will be made**   non-orderable as a stand-alone order code.    RBC 3.64 (L) 3.77 - 5.28 x10E6/uL   Hemoglobin 10.6 (L) 11.1 - 15.9 g/dL  Hematocrit 33.8 (L) 34.0 - 46.6 %   MCV 93 79 - 97 fL   MCH 29.1 26.6 - 33.0 pg   MCHC 31.4 (L) 31.5 - 35.7 g/dL   RDW 10.2 (L) 72.5 - 36.6 %   Platelets 313 150 - 450 x10E3/uL   Neutrophils 82 Not Estab. %   Lymphs 11 Not Estab. %   Monocytes 7 Not Estab. %   Eos 0 Not Estab. %   Basos 0 Not Estab. %   Neutrophils Absolute 6.1 1.4 - 7.0 x10E3/uL   Lymphocytes Absolute 0.8 0.7 - 3.1 x10E3/uL   Monocytes Absolute 0.5 0.1 - 0.9 x10E3/uL   EOS (ABSOLUTE) 0.0 0.0 - 0.4 x10E3/uL   Basophils Absolute 0.0 0.0 - 0.2 x10E3/uL   Immature Granulocytes 0 Not Estab. %   Immature Grans (Abs) 0.0 0.0 - 0.1 x10E3/uL  Lipid panel     Status: None   Collection Time: 10/01/23 11:19 AM  Result Value Ref Range   Cholesterol, Total 179 100 - 199 mg/dL   Triglycerides 93 0 - 149 mg/dL   HDL 84 >44 mg/dL   VLDL Cholesterol Cal 17 5 - 40 mg/dL   LDL Chol Calc (NIH) 78 0 - 99 mg/dL   Chol/HDL Ratio 2.1 0.0 - 4.4 ratio    Comment:                                   T. Chol/HDL Ratio                                              Men  Women                               1/2 Avg.Risk  3.4    3.3                                   Avg.Risk  5.0    4.4                                2X Avg.Risk  9.6    7.1                                3X Avg.Risk 23.4   11.0   VITAMIN D 25 Hydroxy (Vit-D Deficiency, Fractures)     Status: None   Collection Time: 10/01/23 11:19 AM  Result Value Ref Range   Vit D, 25-Hydroxy 53.9 30.0 - 100.0 ng/mL    Comment: Vitamin D deficiency has been defined by the Institute of Medicine and an Endocrine Society practice guideline as a level of serum 25-OH vitamin D less than 20 ng/mL (1,2). The Endocrine Society went on to further define vitamin D insufficiency as a level between 21 and 29 ng/mL (2). 1. IOM (Institute of Medicine). 2010. Dietary reference    intakes for calcium and D. [place]: The    [employer]. 2. Holick MF, Binkley [place], Bischoff-Ferrari HA, et al.    Evaluation, treatment, and prevention of vitamin D  deficiency: an Endocrine Society clinical practice    guideline. JCEM. 2011 Jul; 96(7):1911-30.   CMP14+EGFR     Status: Abnormal   Collection Time: 10/01/23 11:19 AM  Result Value Ref Range   Glucose 79 70 - 99 mg/dL   BUN 15 8 - 27 mg/dL   Creatinine, Ser 9.51 (H) 0.57 - 1.00 mg/dL   eGFR 54 (L) >88 CZ/YSA/6.30   BUN/Creatinine Ratio 15 12 - 28   Sodium 136 134 - 144 mmol/L   Potassium 4.7 3.5 - 5.2 mmol/L   Chloride 97 96 - 106 mmol/L   CO2 26 20 - 29 mmol/L   Calcium 9.0 8.7 - 10.3 mg/dL   Total Protein 6.9 6.0 - 8.5 g/dL   Albumin 4.3 3.7 - 4.7 g/dL   Globulin, Total 2.6 1.5 - 4.5 g/dL   Bilirubin Total 0.3 0.0 - 1.2 mg/dL   Alkaline Phosphatase 127 (H) 44 - 121 IU/L   AST 30 0 - 40 IU/L   ALT 22 0 - 32 IU/L  TSH     Status: Abnormal   Collection Time: 10/01/23 11:19 AM  Result Value Ref Range   TSH 0.324 (L) 0.450 - 4.500 uIU/mL  Hemoglobin A1c     Status: Abnormal   Collection Time:  10/01/23 11:19 AM  Result Value Ref Range   Hgb A1c MFr Bld 5.7 (H) 4.8 - 5.6 %    Comment:          Prediabetes: 5.7 - 6.4          Diabetes: >6.4          Glycemic control for adults with diabetes: <7.0    Est. average glucose Bld gHb Est-mCnc 117 mg/dL  Vitamin Z60     Status: None   Collection Time: 10/01/23 11:19 AM  Result Value Ref Range   Vitamin B-12 1,166 232 - 1,245 pg/mL  Aerobic culture     Status: Abnormal   Collection Time: 10/31/23 10:55 AM  Result Value Ref Range   Aerobic Bacterial Culture Final report (A)    Organism ID, Bacteria Staphylococcus aureus (A)     Comment: Based on susceptibility to oxacillin this isolate would be susceptible to: *Penicillinase-stable penicillins, such as:   Cloxacillin, Dicloxacillin, Nafcillin *Beta-lactam combination agents, such as:   Amoxicillin-clavulanic acid, Ampicillin-sulbactam,   Piperacillin-tazobactam *Oral cephems, such as:   Cefaclor, Cefdinir, Cefpodoxime, Cefprozil, Cefuroxime,   Cephalexin, Loracarbef *Parenteral cephems, such as:   Cefazolin, Cefepime, Cefotaxime, Cefotetan, Ceftaroline,   Ceftizoxime, Ceftriaxone, Cefuroxime *Carbapenems, such as:   Doripenem, Ertapenem, Imipenem, Meropenem Heavy growth    Organism ID, Bacteria Comment (A)     Comment: Corynebacterium striatum Heavy growth Susceptibility not normally performed on this organism.    Organism ID, Bacteria Comment     Comment: Multiple negative rods present, none predominant. This is indicative of a heavily colonized/contaminated specimen site. No further microbiological characterization will be done as it will not provide clinically relevant information. Moderate growth    Organism ID, Bacteria Not applicable    Antimicrobial Susceptibility Comment     Comment:       ** S = Susceptible; I = Intermediate; R = Resistant **                    P = Positive; N = Negative             MICS are expressed in micrograms per mL    Antibiotic  RSLT#1    RSLT#2    RSLT#3    RSLT#4 Ciprofloxacin                  R Clindamycin                    S Erythromycin                   R Gentamicin                     S Levofloxacin                   I Linezolid                      S Moxifloxacin                   I Oxacillin                      S Penicillin                     R Quinupristin/Dalfopristin      S Rifampin                       S Tetracycline                   S Trimethoprim/Sulfa             S Vancomycin                     S        Assessment & Plan:   Problem List Items Addressed This Visit       Cardiovascular and Mediastinum   Hypertension   Patient sees cardiology, they have been adjusting her meds.  Will defer to them for further changes.         Other   Vitamin D deficiency   Checking labs today.  Will continue supplements as needed.        Relevant Orders   CBC with Diff (Completed)   CMP14+EGFR (Completed)   Hyperlipidemia   Checking labs today.  Continue current therapy for lipid control. Will modify as needed based on labwork results.       Relevant Orders   CBC with Diff (Completed)   Lipid panel (Completed)   CMP14+EGFR (Completed)   Other Visit Diagnoses       Epistaxis, recurrent    -  Primary   Sending referral to ENT. Have suggested that patient utilize supportive measures including humidifier and nasal saline gel until this can be accomplished   Relevant Orders   Ambulatory referral to ENT   CBC with Diff (Completed)   CMP14+EGFR (Completed)     Toe swelling       Relevant Orders   Uric acid (Completed)   CBC with Diff (Completed)   CMP14+EGFR (Completed)     B12 deficiency due to diet       Checking labs today.  Will continue supplements as needed.   Relevant Orders   CBC with Diff (Completed)   CMP14+EGFR (Completed)   Vitamin B12 (Completed)     Prediabetes       A1C is in prediabetic ranges. Patient counseled on dietary choices and  verbalized understanding. Will reassess at follow up after next lab check.  Relevant Orders   CBC with Diff (Completed)   CMP14+EGFR (Completed)   Hemoglobin A1c (Completed)     Vitamin D deficiency, unspecified       Checking labs today.  Will continue supplements as needed.   Relevant Orders   CBC with Diff (Completed)   VITAMIN D 25 Hydroxy (Vit-D Deficiency, Fractures) (Completed)   CMP14+EGFR (Completed)     Other fatigue       Relevant Orders   CBC with Diff (Completed)   CMP14+EGFR (Completed)   TSH (Completed)       Return in about 1 month (around 10/31/2023) for AWV.   Total time spent: 30 minutes  Miki Kins, FNP  10/01/2023   This document may have been prepared by [facility] Voice Recognition software and as such may include unintentional dictation errors.

## 2023-10-02 DIAGNOSIS — I4891 Unspecified atrial fibrillation: Secondary | ICD-10-CM | POA: Diagnosis not present

## 2023-10-02 DIAGNOSIS — L97422 Non-pressure chronic ulcer of left heel and midfoot with fat layer exposed: Secondary | ICD-10-CM | POA: Diagnosis not present

## 2023-10-02 DIAGNOSIS — E569 Vitamin deficiency, unspecified: Secondary | ICD-10-CM | POA: Diagnosis not present

## 2023-10-02 DIAGNOSIS — I2510 Atherosclerotic heart disease of native coronary artery without angina pectoris: Secondary | ICD-10-CM | POA: Diagnosis not present

## 2023-10-02 DIAGNOSIS — Z791 Long term (current) use of non-steroidal anti-inflammatories (NSAID): Secondary | ICD-10-CM | POA: Diagnosis not present

## 2023-10-02 DIAGNOSIS — Z7982 Long term (current) use of aspirin: Secondary | ICD-10-CM | POA: Diagnosis not present

## 2023-10-02 DIAGNOSIS — L97812 Non-pressure chronic ulcer of other part of right lower leg with fat layer exposed: Secondary | ICD-10-CM | POA: Diagnosis not present

## 2023-10-02 DIAGNOSIS — I5022 Chronic systolic (congestive) heart failure: Secondary | ICD-10-CM | POA: Diagnosis not present

## 2023-10-02 DIAGNOSIS — Z9181 History of falling: Secondary | ICD-10-CM | POA: Diagnosis not present

## 2023-10-02 DIAGNOSIS — I110 Hypertensive heart disease with heart failure: Secondary | ICD-10-CM | POA: Diagnosis not present

## 2023-10-02 DIAGNOSIS — Z556 Problems related to health literacy: Secondary | ICD-10-CM | POA: Diagnosis not present

## 2023-10-02 DIAGNOSIS — Z7902 Long term (current) use of antithrombotics/antiplatelets: Secondary | ICD-10-CM | POA: Diagnosis not present

## 2023-10-02 DIAGNOSIS — I872 Venous insufficiency (chronic) (peripheral): Secondary | ICD-10-CM | POA: Diagnosis not present

## 2023-10-02 DIAGNOSIS — I739 Peripheral vascular disease, unspecified: Secondary | ICD-10-CM | POA: Diagnosis not present

## 2023-10-02 DIAGNOSIS — D649 Anemia, unspecified: Secondary | ICD-10-CM | POA: Diagnosis not present

## 2023-10-02 LAB — CBC WITH DIFFERENTIAL/PLATELET
Basophils Absolute: 0 10*3/uL (ref 0.0–0.2)
Basos: 0 %
EOS (ABSOLUTE): 0 10*3/uL (ref 0.0–0.4)
Eos: 0 %
Hematocrit: 33.8 % — ABNORMAL LOW (ref 34.0–46.6)
Hemoglobin: 10.6 g/dL — ABNORMAL LOW (ref 11.1–15.9)
Immature Grans (Abs): 0 10*3/uL (ref 0.0–0.1)
Immature Granulocytes: 0 %
Lymphocytes Absolute: 0.8 10*3/uL (ref 0.7–3.1)
Lymphs: 11 %
MCH: 29.1 pg (ref 26.6–33.0)
MCHC: 31.4 g/dL — ABNORMAL LOW (ref 31.5–35.7)
MCV: 93 fL (ref 79–97)
Monocytes Absolute: 0.5 10*3/uL (ref 0.1–0.9)
Monocytes: 7 %
Neutrophils Absolute: 6.1 10*3/uL (ref 1.4–7.0)
Neutrophils: 82 %
Platelets: 313 10*3/uL (ref 150–450)
RBC: 3.64 x10E6/uL — ABNORMAL LOW (ref 3.77–5.28)
RDW: 11.6 % — ABNORMAL LOW (ref 11.7–15.4)
WBC: 7.5 10*3/uL (ref 3.4–10.8)

## 2023-10-02 LAB — VITAMIN D 25 HYDROXY (VIT D DEFICIENCY, FRACTURES): Vit D, 25-Hydroxy: 53.9 ng/mL (ref 30.0–100.0)

## 2023-10-02 LAB — CMP14+EGFR
ALT: 22 [IU]/L (ref 0–32)
AST: 30 [IU]/L (ref 0–40)
Albumin: 4.3 g/dL (ref 3.7–4.7)
Alkaline Phosphatase: 127 [IU]/L — ABNORMAL HIGH (ref 44–121)
BUN/Creatinine Ratio: 15 (ref 12–28)
BUN: 15 mg/dL (ref 8–27)
Bilirubin Total: 0.3 mg/dL (ref 0.0–1.2)
CO2: 26 mmol/L (ref 20–29)
Calcium: 9 mg/dL (ref 8.7–10.3)
Chloride: 97 mmol/L (ref 96–106)
Creatinine, Ser: 1.02 mg/dL — ABNORMAL HIGH (ref 0.57–1.00)
Globulin, Total: 2.6 g/dL (ref 1.5–4.5)
Glucose: 79 mg/dL (ref 70–99)
Potassium: 4.7 mmol/L (ref 3.5–5.2)
Sodium: 136 mmol/L (ref 134–144)
Total Protein: 6.9 g/dL (ref 6.0–8.5)
eGFR: 54 mL/min/{1.73_m2} — ABNORMAL LOW (ref >59)

## 2023-10-02 LAB — VITAMIN B12: Vitamin B-12: 1166 pg/mL (ref 232–1245)

## 2023-10-02 LAB — LIPID PANEL
Chol/HDL Ratio: 2.1 ratio (ref 0.0–4.4)
Cholesterol, Total: 179 mg/dL (ref 100–199)
HDL: 84 mg/dL (ref >39)
LDL Chol Calc (NIH): 78 mg/dL (ref 0–99)
Triglycerides: 93 mg/dL (ref 0–149)
VLDL Cholesterol Cal: 17 mg/dL (ref 5–40)

## 2023-10-02 LAB — HEMOGLOBIN A1C
Est. average glucose Bld gHb Est-mCnc: 117 mg/dL
Hgb A1c MFr Bld: 5.7 % — ABNORMAL HIGH (ref 4.8–5.6)

## 2023-10-02 LAB — TSH: TSH: 0.324 u[IU]/mL — ABNORMAL LOW (ref 0.450–4.500)

## 2023-10-02 LAB — URIC ACID: Uric Acid: 4.9 mg/dL (ref 3.1–7.9)

## 2023-10-06 ENCOUNTER — Telehealth: Payer: Self-pay

## 2023-10-06 ENCOUNTER — Telehealth (INDEPENDENT_AMBULATORY_CARE_PROVIDER_SITE_OTHER): Payer: Self-pay

## 2023-10-06 DIAGNOSIS — D649 Anemia, unspecified: Secondary | ICD-10-CM | POA: Diagnosis not present

## 2023-10-06 DIAGNOSIS — Z7902 Long term (current) use of antithrombotics/antiplatelets: Secondary | ICD-10-CM | POA: Diagnosis not present

## 2023-10-06 DIAGNOSIS — L97812 Non-pressure chronic ulcer of other part of right lower leg with fat layer exposed: Secondary | ICD-10-CM | POA: Diagnosis not present

## 2023-10-06 DIAGNOSIS — Z7982 Long term (current) use of aspirin: Secondary | ICD-10-CM | POA: Diagnosis not present

## 2023-10-06 DIAGNOSIS — I5022 Chronic systolic (congestive) heart failure: Secondary | ICD-10-CM | POA: Diagnosis not present

## 2023-10-06 DIAGNOSIS — I2510 Atherosclerotic heart disease of native coronary artery without angina pectoris: Secondary | ICD-10-CM | POA: Diagnosis not present

## 2023-10-06 DIAGNOSIS — L97422 Non-pressure chronic ulcer of left heel and midfoot with fat layer exposed: Secondary | ICD-10-CM | POA: Diagnosis not present

## 2023-10-06 DIAGNOSIS — I872 Venous insufficiency (chronic) (peripheral): Secondary | ICD-10-CM | POA: Diagnosis not present

## 2023-10-06 DIAGNOSIS — Z9181 History of falling: Secondary | ICD-10-CM | POA: Diagnosis not present

## 2023-10-06 DIAGNOSIS — I110 Hypertensive heart disease with heart failure: Secondary | ICD-10-CM | POA: Diagnosis not present

## 2023-10-06 DIAGNOSIS — Z556 Problems related to health literacy: Secondary | ICD-10-CM | POA: Diagnosis not present

## 2023-10-06 DIAGNOSIS — Z791 Long term (current) use of non-steroidal anti-inflammatories (NSAID): Secondary | ICD-10-CM | POA: Diagnosis not present

## 2023-10-06 DIAGNOSIS — I4891 Unspecified atrial fibrillation: Secondary | ICD-10-CM | POA: Diagnosis not present

## 2023-10-06 DIAGNOSIS — I739 Peripheral vascular disease, unspecified: Secondary | ICD-10-CM | POA: Diagnosis not present

## 2023-10-06 DIAGNOSIS — E569 Vitamin deficiency, unspecified: Secondary | ICD-10-CM | POA: Diagnosis not present

## 2023-10-06 NOTE — Telephone Encounter (Signed)
[facility] LPN called asking for verbal orders to do the same treatment she's been doing on her sores, she has a new one on her 2nd toe- she also has a skin tear below her shin- please advise

## 2023-10-06 NOTE — Telephone Encounter (Signed)
That is fine 

## 2023-10-06 NOTE — Telephone Encounter (Signed)
[name] [name] from [facility] home [facility] called for verbal orders for a Social work to do a visit. She stated they have some concerns and need a Child psychotherapist there immediately.   Please advise

## 2023-10-07 ENCOUNTER — Ambulatory Visit: Payer: 59 | Admitting: Family

## 2023-10-07 DIAGNOSIS — I110 Hypertensive heart disease with heart failure: Secondary | ICD-10-CM | POA: Diagnosis not present

## 2023-10-07 DIAGNOSIS — Z7982 Long term (current) use of aspirin: Secondary | ICD-10-CM | POA: Diagnosis not present

## 2023-10-07 DIAGNOSIS — D649 Anemia, unspecified: Secondary | ICD-10-CM | POA: Diagnosis not present

## 2023-10-07 DIAGNOSIS — Z791 Long term (current) use of non-steroidal anti-inflammatories (NSAID): Secondary | ICD-10-CM | POA: Diagnosis not present

## 2023-10-07 DIAGNOSIS — Z7902 Long term (current) use of antithrombotics/antiplatelets: Secondary | ICD-10-CM | POA: Diagnosis not present

## 2023-10-07 DIAGNOSIS — I739 Peripheral vascular disease, unspecified: Secondary | ICD-10-CM | POA: Diagnosis not present

## 2023-10-07 DIAGNOSIS — I4891 Unspecified atrial fibrillation: Secondary | ICD-10-CM | POA: Diagnosis not present

## 2023-10-07 DIAGNOSIS — I5022 Chronic systolic (congestive) heart failure: Secondary | ICD-10-CM | POA: Diagnosis not present

## 2023-10-07 DIAGNOSIS — E569 Vitamin deficiency, unspecified: Secondary | ICD-10-CM | POA: Diagnosis not present

## 2023-10-07 DIAGNOSIS — I2510 Atherosclerotic heart disease of native coronary artery without angina pectoris: Secondary | ICD-10-CM | POA: Diagnosis not present

## 2023-10-07 DIAGNOSIS — L97812 Non-pressure chronic ulcer of other part of right lower leg with fat layer exposed: Secondary | ICD-10-CM | POA: Diagnosis not present

## 2023-10-07 DIAGNOSIS — L97422 Non-pressure chronic ulcer of left heel and midfoot with fat layer exposed: Secondary | ICD-10-CM | POA: Diagnosis not present

## 2023-10-07 DIAGNOSIS — Z9181 History of falling: Secondary | ICD-10-CM | POA: Diagnosis not present

## 2023-10-07 DIAGNOSIS — I872 Venous insufficiency (chronic) (peripheral): Secondary | ICD-10-CM | POA: Diagnosis not present

## 2023-10-07 DIAGNOSIS — Z556 Problems related to health literacy: Secondary | ICD-10-CM | POA: Diagnosis not present

## 2023-10-07 NOTE — Telephone Encounter (Signed)
Arline Asp called back and confirmed she received the verbal orders

## 2023-10-07 NOTE — Telephone Encounter (Signed)
[facility] front desk sent me to Cindy's cell, no answer or voicemail came on. I called back and the other manager was in a meeting. I asked to get sent to Cindy's work voicemail and I left verbal orders there, and instructed to call back with any questions

## 2023-10-08 DIAGNOSIS — L97819 Non-pressure chronic ulcer of other part of right lower leg with unspecified severity: Secondary | ICD-10-CM | POA: Diagnosis not present

## 2023-10-08 DIAGNOSIS — I87313 Chronic venous hypertension (idiopathic) with ulcer of bilateral lower extremity: Secondary | ICD-10-CM | POA: Diagnosis not present

## 2023-10-08 DIAGNOSIS — L97422 Non-pressure chronic ulcer of left heel and midfoot with fat layer exposed: Secondary | ICD-10-CM | POA: Diagnosis not present

## 2023-10-10 DIAGNOSIS — L97422 Non-pressure chronic ulcer of left heel and midfoot with fat layer exposed: Secondary | ICD-10-CM | POA: Diagnosis not present

## 2023-10-10 DIAGNOSIS — I2510 Atherosclerotic heart disease of native coronary artery without angina pectoris: Secondary | ICD-10-CM | POA: Diagnosis not present

## 2023-10-10 DIAGNOSIS — I110 Hypertensive heart disease with heart failure: Secondary | ICD-10-CM | POA: Diagnosis not present

## 2023-10-10 DIAGNOSIS — I872 Venous insufficiency (chronic) (peripheral): Secondary | ICD-10-CM | POA: Diagnosis not present

## 2023-10-10 DIAGNOSIS — E569 Vitamin deficiency, unspecified: Secondary | ICD-10-CM | POA: Diagnosis not present

## 2023-10-10 DIAGNOSIS — I4891 Unspecified atrial fibrillation: Secondary | ICD-10-CM | POA: Diagnosis not present

## 2023-10-10 DIAGNOSIS — I5022 Chronic systolic (congestive) heart failure: Secondary | ICD-10-CM | POA: Diagnosis not present

## 2023-10-10 DIAGNOSIS — Z791 Long term (current) use of non-steroidal anti-inflammatories (NSAID): Secondary | ICD-10-CM | POA: Diagnosis not present

## 2023-10-10 DIAGNOSIS — D649 Anemia, unspecified: Secondary | ICD-10-CM | POA: Diagnosis not present

## 2023-10-10 DIAGNOSIS — Z7982 Long term (current) use of aspirin: Secondary | ICD-10-CM | POA: Diagnosis not present

## 2023-10-10 DIAGNOSIS — I739 Peripheral vascular disease, unspecified: Secondary | ICD-10-CM | POA: Diagnosis not present

## 2023-10-10 DIAGNOSIS — Z9181 History of falling: Secondary | ICD-10-CM | POA: Diagnosis not present

## 2023-10-10 DIAGNOSIS — L97812 Non-pressure chronic ulcer of other part of right lower leg with fat layer exposed: Secondary | ICD-10-CM | POA: Diagnosis not present

## 2023-10-10 DIAGNOSIS — Z7902 Long term (current) use of antithrombotics/antiplatelets: Secondary | ICD-10-CM | POA: Diagnosis not present

## 2023-10-10 DIAGNOSIS — Z556 Problems related to health literacy: Secondary | ICD-10-CM | POA: Diagnosis not present

## 2023-10-11 ENCOUNTER — Encounter: Payer: Self-pay | Admitting: Family

## 2023-10-11 DIAGNOSIS — G990 Autonomic neuropathy in diseases classified elsewhere: Secondary | ICD-10-CM | POA: Insufficient documentation

## 2023-10-11 NOTE — Assessment & Plan Note (Signed)
Patient stable.  Well controlled with current therapy.   Continue current meds.  

## 2023-10-11 NOTE — Assessment & Plan Note (Signed)
Patient is seen by Vascular, who manage this condition.  She is well controlled with current therapy.   Will defer to them for further changes to plan of care.

## 2023-10-11 NOTE — Assessment & Plan Note (Signed)
Patient is seen by Cardiology, who manage this condition.  She is well controlled with current therapy.   Will defer to them for further changes to plan of care.

## 2023-10-13 ENCOUNTER — Telehealth: Payer: Self-pay

## 2023-10-13 DIAGNOSIS — Z556 Problems related to health literacy: Secondary | ICD-10-CM | POA: Diagnosis not present

## 2023-10-13 DIAGNOSIS — E569 Vitamin deficiency, unspecified: Secondary | ICD-10-CM | POA: Diagnosis not present

## 2023-10-13 DIAGNOSIS — I2510 Atherosclerotic heart disease of native coronary artery without angina pectoris: Secondary | ICD-10-CM | POA: Diagnosis not present

## 2023-10-13 DIAGNOSIS — Z791 Long term (current) use of non-steroidal anti-inflammatories (NSAID): Secondary | ICD-10-CM | POA: Diagnosis not present

## 2023-10-13 DIAGNOSIS — I739 Peripheral vascular disease, unspecified: Secondary | ICD-10-CM | POA: Diagnosis not present

## 2023-10-13 DIAGNOSIS — Z9181 History of falling: Secondary | ICD-10-CM | POA: Diagnosis not present

## 2023-10-13 DIAGNOSIS — I110 Hypertensive heart disease with heart failure: Secondary | ICD-10-CM | POA: Diagnosis not present

## 2023-10-13 DIAGNOSIS — L97812 Non-pressure chronic ulcer of other part of right lower leg with fat layer exposed: Secondary | ICD-10-CM | POA: Diagnosis not present

## 2023-10-13 DIAGNOSIS — I4891 Unspecified atrial fibrillation: Secondary | ICD-10-CM | POA: Diagnosis not present

## 2023-10-13 DIAGNOSIS — L97422 Non-pressure chronic ulcer of left heel and midfoot with fat layer exposed: Secondary | ICD-10-CM | POA: Diagnosis not present

## 2023-10-13 DIAGNOSIS — D649 Anemia, unspecified: Secondary | ICD-10-CM | POA: Diagnosis not present

## 2023-10-13 DIAGNOSIS — Z7902 Long term (current) use of antithrombotics/antiplatelets: Secondary | ICD-10-CM | POA: Diagnosis not present

## 2023-10-13 DIAGNOSIS — I872 Venous insufficiency (chronic) (peripheral): Secondary | ICD-10-CM | POA: Diagnosis not present

## 2023-10-13 DIAGNOSIS — I5022 Chronic systolic (congestive) heart failure: Secondary | ICD-10-CM | POA: Diagnosis not present

## 2023-10-13 DIAGNOSIS — Z7982 Long term (current) use of aspirin: Secondary | ICD-10-CM | POA: Diagnosis not present

## 2023-10-13 NOTE — Telephone Encounter (Signed)
FYIIan Malkin at [facility] to apply Collagen and dry dressing to left 2nd toe (Stage 2) and to apply Xeroform and a dry dressing to right great toe and second toe split.

## 2023-10-16 ENCOUNTER — Telehealth: Payer: Self-pay

## 2023-10-16 DIAGNOSIS — Z791 Long term (current) use of non-steroidal anti-inflammatories (NSAID): Secondary | ICD-10-CM | POA: Diagnosis not present

## 2023-10-16 DIAGNOSIS — L97422 Non-pressure chronic ulcer of left heel and midfoot with fat layer exposed: Secondary | ICD-10-CM | POA: Diagnosis not present

## 2023-10-16 DIAGNOSIS — Z7982 Long term (current) use of aspirin: Secondary | ICD-10-CM | POA: Diagnosis not present

## 2023-10-16 DIAGNOSIS — Z556 Problems related to health literacy: Secondary | ICD-10-CM | POA: Diagnosis not present

## 2023-10-16 DIAGNOSIS — I110 Hypertensive heart disease with heart failure: Secondary | ICD-10-CM | POA: Diagnosis not present

## 2023-10-16 DIAGNOSIS — Z9181 History of falling: Secondary | ICD-10-CM | POA: Diagnosis not present

## 2023-10-16 DIAGNOSIS — I5022 Chronic systolic (congestive) heart failure: Secondary | ICD-10-CM | POA: Diagnosis not present

## 2023-10-16 DIAGNOSIS — Z7902 Long term (current) use of antithrombotics/antiplatelets: Secondary | ICD-10-CM | POA: Diagnosis not present

## 2023-10-16 DIAGNOSIS — I4891 Unspecified atrial fibrillation: Secondary | ICD-10-CM | POA: Diagnosis not present

## 2023-10-16 DIAGNOSIS — L97812 Non-pressure chronic ulcer of other part of right lower leg with fat layer exposed: Secondary | ICD-10-CM | POA: Diagnosis not present

## 2023-10-16 DIAGNOSIS — I739 Peripheral vascular disease, unspecified: Secondary | ICD-10-CM | POA: Diagnosis not present

## 2023-10-16 DIAGNOSIS — E569 Vitamin deficiency, unspecified: Secondary | ICD-10-CM | POA: Diagnosis not present

## 2023-10-16 DIAGNOSIS — I872 Venous insufficiency (chronic) (peripheral): Secondary | ICD-10-CM | POA: Diagnosis not present

## 2023-10-16 DIAGNOSIS — D649 Anemia, unspecified: Secondary | ICD-10-CM | POA: Diagnosis not present

## 2023-10-16 DIAGNOSIS — I2510 Atherosclerotic heart disease of native coronary artery without angina pectoris: Secondary | ICD-10-CM | POA: Diagnosis not present

## 2023-10-16 NOTE — Telephone Encounter (Signed)
[name] Dresser LPN with home health called and reported pts bp was 212/99- she was advised to tell pt to take an extra lisinopril and recheck her BP in an hour and also to call and get an appt scheduled with her cardiologist    [phone]

## 2023-10-17 ENCOUNTER — Encounter: Payer: Self-pay | Admitting: Family

## 2023-10-17 ENCOUNTER — Ambulatory Visit: Payer: 59 | Attending: Family | Admitting: Family

## 2023-10-17 VITALS — BP 154/70 | HR 62 | Wt 106.0 lb

## 2023-10-17 DIAGNOSIS — Z79899 Other long term (current) drug therapy: Secondary | ICD-10-CM | POA: Diagnosis not present

## 2023-10-17 DIAGNOSIS — M419 Scoliosis, unspecified: Secondary | ICD-10-CM | POA: Insufficient documentation

## 2023-10-17 DIAGNOSIS — I2510 Atherosclerotic heart disease of native coronary artery without angina pectoris: Secondary | ICD-10-CM | POA: Insufficient documentation

## 2023-10-17 DIAGNOSIS — I482 Chronic atrial fibrillation, unspecified: Secondary | ICD-10-CM | POA: Insufficient documentation

## 2023-10-17 DIAGNOSIS — I509 Heart failure, unspecified: Secondary | ICD-10-CM | POA: Diagnosis not present

## 2023-10-17 DIAGNOSIS — E785 Hyperlipidemia, unspecified: Secondary | ICD-10-CM | POA: Diagnosis not present

## 2023-10-17 DIAGNOSIS — I110 Hypertensive heart disease with heart failure: Secondary | ICD-10-CM | POA: Insufficient documentation

## 2023-10-17 DIAGNOSIS — Z720 Tobacco use: Secondary | ICD-10-CM | POA: Diagnosis not present

## 2023-10-17 DIAGNOSIS — Z955 Presence of coronary angioplasty implant and graft: Secondary | ICD-10-CM | POA: Insufficient documentation

## 2023-10-17 DIAGNOSIS — I10 Essential (primary) hypertension: Secondary | ICD-10-CM

## 2023-10-17 DIAGNOSIS — I5022 Chronic systolic (congestive) heart failure: Secondary | ICD-10-CM

## 2023-10-17 DIAGNOSIS — I739 Peripheral vascular disease, unspecified: Secondary | ICD-10-CM | POA: Diagnosis not present

## 2023-10-17 DIAGNOSIS — D649 Anemia, unspecified: Secondary | ICD-10-CM | POA: Diagnosis not present

## 2023-10-17 MED ORDER — ENTRESTO 24-26 MG PO TABS: 1.0000 | ORAL_TABLET | Freq: Two times a day (BID) | ORAL | 5 refills | Status: DC

## 2023-10-17 NOTE — Patient Instructions (Addendum)
DISCONTINUE LISINOPRIL  START ENTRESTO 24-26 MG TWICE DAILY  Please have your echo completed. You will check in for this at the [facility]. You have to arrive 15 MINS EARLY for preparation, otherwise you will have to reschedule.  YOUR ECHO APPT IS SCHEDULED JANUARY 10TH AT 10AM - [address]  YOUR NEXT APPT WITH TINA IS JANUARY 17TH AT 10AM

## 2023-10-17 NOTE — Progress Notes (Signed)
Advanced Heart Failure Clinic Note   Referring Physician: PCP office PCP: Miki Kins, FNP (last seen 11/24) Cardiologist: Lorine Bears, MD (last seen 09/21)  HPI:  [name] [name] is a 88 y/o female with a history of anemia, cellulitis, chronic atrial fibrillation, HTN, CAD DES to LAD 06/12/2020, tobacco use, hyponatremia, PVD, scoliosis and chronic heart failure. Larey Seat ~2024 at a grocery store and hit her head.  Admitted 06/11/2020 due to dyspnea as well as left upper extremity pain. Her troponin was elevated and cardiology was consulted. Diagnosed with NSTEMI. She underwent cardiac catheterization 06/12/2020 showing severe one-vessel coronary disease with 99% stenosis in the mid LAD origin of second diagonal-underwent angioplasty and DES. EF 35% by LHC with moderately elevated LVEDP. Initiation of ACE/ARB was somewhat difficult due to relative low blood pressure. Recommended for DAPT X 1 year. Echo 06/12/2020 EF 30-35%, mild LVH, grade 2 diastolic dysfunction, severe hypokinesis of LV with wall motion suggestive LAD infarct, mildly elevated PASP, no significant valvular abnormalities.   LHC 06/12/20: A drug-eluting stent was successfully placed using a STENT RESOLUTE ONYX 2.75X15. Mid LAD-1 lesion is 99% stenosed. Post intervention, there is a 0% residual stenosis. Mid LAD-2 lesion is 30% stenosed. There is moderate to severe left ventricular systolic dysfunction. The left ventricular ejection fraction is 25-35% by visual estimate. LV end diastolic pressure is moderately elevated. Prox RCA to Mid RCA lesion is 30% stenosed. Dist RCA lesion is 30% stenosed.  1.  Severe one-vessel coronary artery disease with 99% stenosis in the mid LAD at the origin of second diagonal.  No other obstructive disease. 2.  Moderately severely reduced LV systolic function with an EF of 35% with segmental wall motion abnormalities consistent with an LAD infarct.  Moderately elevated left ventricular end-diastolic  pressure 3.  Successful angioplasty and drug-eluting stent placement to the mid LAD.  Second diagonal was jailed by the stent initially with loss of flow that improved after giving intracoronary nitroglycerin.  There was TIMI-3 flow at the end.  She presents today for her initial HF clinic visit with a chief complaint of moderate shortness of breath with minimal exertion. Chronic in nature. Has associated fatigue, occasional dizziness and chronic difficulty sleeping along with this. Has bilateral lower legs wrapped. She says that the bandages get changed twice weekly by a home health nurse. Has to walk up 8 steps to get into her apartment and says that sometimes she is so SOB at the top of the steps that she has to stand there to catch her breath. Denies chest pain, palpitations, cough, abdominal distention or pedal edema. Reports having a good appetite.   Says that her home health person checked her BP and it was extremely high which is why she ended up in the office today. Denies any headaches or visual changes.   Moved to [place] from [place] ~ 5 years ago (2019) to liver closer to family but she says that she wants to return to [place]. She says that her sister frustrates her when she comes over to visits and starts moving "everything around" and if she gets mad, she starts to throw things. Denies any physical or mental abuse from her sister. She says that her sister has "some access" to her finances but is not her POA.    Review of Systems: [y] = yes, [ ]  = no   General: Weight gain [ ] ; Weight loss [ ] ; Anorexia [ ] ; Fatigue Cove.Etienne ]; Fever [ ] ; Chills [ ] ; Weakness [ ]   Cardiac: Chest pain/pressure [ ] ; Resting SOB [ ] ; Exertional SOB Cove.Etienne ]; Orthopnea [ ] ; Pedal Edema [ ] ; Palpitations [ ] ; Syncope [ ] ; Presyncope [ ] ; Paroxysmal nocturnal dyspnea[ ]   Pulmonary: Cough [ ] ; Wheezing[ ] ; Hemoptysis[ ] ; Sputum [ ] ; Snoring [ ]   GI: Vomiting[ ] ; Dysphagia[ ] ; Melena[ ] ; Hematochezia [ ] ; Heartburn[ ] ;  Abdominal pain [ ] ; Constipation [ ] ; Diarrhea [ ] ; BRBPR [ ]   GU: Hematuria[ ] ; Dysuria [ ] ; Nocturia[ ]   Vascular: Pain in legs with walking [ ] ; Pain in feet with lying flat [ ] ; Non-healing sores [ ] ; Stroke [ ] ; TIA [ ] ; Slurred speech [ ] ;  Neuro: Headaches[ ] ; Vertigo[ ] ; Seizures[ ] ; Paresthesias[ ] ;Blurred vision [ ] ; Dizziness Cove.Etienne ]; Vision changes [ ]   Ortho/Skin: Arthritis [ ] ; Joint pain [ ] ; Muscle pain [ ] ; Joint swelling [ ] ; Back Pain [ ] ; Rash [ ]   Psych: Depression[ ] ; Anxiety[ ]   Heme: Bleeding problems [ ] ; Clotting disorders [ ] ; Anemia [ ]   Endocrine: Diabetes [ ] ; Thyroid dysfunction[ ]    Past Medical History:  Diagnosis Date   AKI (acute kidney injury) (HCC) 02/20/2023   Atrial fibrillation (HCC)    Basal cell carcinoma 2021   L temple   Chronic back pain    Congestive heart failure (CHF) (HCC)    History of total hip replacement (Bilateral) 01/12/2020   Left hip replacement done in 2019.  Right hip replacement done in 2012.   Hypertension    Hyponatremia 06/11/2020   Melena 02/21/2023   NSTEMI (non-ST elevated myocardial infarction) (HCC) 06/11/2020   Pharmacologic therapy 01/12/2020   Problems influencing health status 01/12/2020   Scoliosis    Upper GI bleeding 02/21/2023    Current Outpatient Medications  Medication Sig Dispense Refill   aspirin EC 81 MG tablet Take 1 tablet (81 mg total) by mouth daily. Swallow whole. 30 tablet 0   clopidogrel (PLAVIX) 75 MG tablet TAKE 1 TABLET BY MOUTH ONCE DAILY 30 tablet 8   erythromycin ophthalmic ointment Place 3.5 g into both eyes as needed.     feeding supplement (ENSURE ENLIVE / ENSURE PLUS) LIQD Take 237 mLs by mouth 2 (two) times daily between meals. 237 mL 12   Ferrous Sulfate (IRON) 325 (65 Fe) MG TABS TAKE 1 TABLET BY MOUTH ONCE DAILY 30 tablet 8   gabapentin (NEURONTIN) 400 MG capsule TAKE 1 CAPSULE BY MOUTH FOUR TIMES DAILY 120 capsule 8   lisinopril (ZESTRIL) 10 MG tablet Take 1 tablet (10 mg total)  by mouth daily. 90 tablet 1   lisinopril (ZESTRIL) 5 MG tablet TAKE 1 TABLET BY MOUTH ONCE DAILY 30 tablet 8   meloxicam (MOBIC) 15 MG tablet TAKE 1 TABLET BY MOUTH ONCE DAILY 30 tablet 8   metoprolol succinate (TOPROL-XL) 25 MG 24 hr tablet TAKE 1 TABLET BY MOUTH ONCE DAILY 30 tablet 8   oxyCODONE-acetaminophen (PERCOCET) 10-325 MG tablet Take 1 tablet by mouth every 6 (six) hours as needed. 120 tablet 0   terbinafine (LAMISIL) 250 MG tablet Take 250 mg by mouth daily.     No current facility-administered medications for this visit.    Allergies  Allergen Reactions   Spironolactone Anaphylaxis   Fluogen [Influenza Virus Vaccine] Hives   Statins     Dizziness & weakness   Sulfa Antibiotics Other (See Comments)    Unknown, happened as a child      Social History   Socioeconomic History  Marital status: Single    Spouse name: Not on file   Number of children: 0   Years of education: Not on file   Highest education level: Not on file  Occupational History   Not on file  Tobacco Use   Smoking status: Former    Current packs/day: 0.00    Average packs/day: 0.5 packs/day for 68.0 years (34.0 ttl pk-yrs)    Types: Cigarettes    Start date: 06/11/1952    Quit date: 06/11/2020    Years since quitting: 3.3   Smokeless tobacco: Never  Substance and Sexual Activity   Alcohol use: Not Currently   Drug use: Not Currently   Sexual activity: Not on file  Other Topics Concern   Not on file  Social History Narrative   Not on file   Social Determinants of Health   Financial Resource Strain: Not on file  Food Insecurity: Not on file  Transportation Needs: No Transportation Needs (12/25/2022)   PRAPARE - Administrator, Civil Service (Medical): No    Lack of Transportation (Non-Medical): No  Physical Activity: Insufficiently Active (12/25/2022)   Exercise Vital Sign    Days of Exercise per Week: 7 days    Minutes of Exercise per Session: 20 min  Stress: Stress Concern  Present (12/25/2022)   [employer] of Occupational Health - Occupational Stress Questionnaire    Feeling of Stress : Very much  Social Connections: Socially Isolated (12/25/2022)   Social Connection and Isolation Panel [NHANES]    Frequency of Communication with Friends and Family: Never    Frequency of Social Gatherings with Friends and Family: Never    Attends Religious Services: Never    Database administrator or Organizations: No    Attends Banker Meetings: Never    Marital Status: Never married  Intimate Partner Violence: Not At Risk (12/25/2022)   Humiliation, Afraid, Rape, and Kick questionnaire    Fear of Current or Ex-Partner: No    Emotionally Abused: No    Physically Abused: No    Sexually Abused: No      Family History  Problem Relation Age of Onset   CVA Father    Heart disease Father    Arthritis/Rheumatoid Sister    Vitals:   10/17/23 0933 10/17/23 0940 10/17/23 1002  BP: (!) 203/86 (!) 218/97 (!) 154/70  Pulse: 62 62   SpO2: 98% 98%   Weight: 106 lb (48.1 kg)     Wt Readings from Last 3 Encounters:  10/17/23 106 lb (48.1 kg)  10/01/23 105 lb 12.8 oz (48 kg)  08/27/23 103 lb 3.2 oz (46.8 kg)   Lab Results  Component Value Date   CREATININE 1.02 (H) 10/01/2023   CREATININE 0.60 02/28/2023   CREATININE 0.58 02/26/2023    PHYSICAL EXAM: General:  Thin. No respiratory difficulty HEENT: normal Neck: supple. no JVD. No lymphadenopathy or thyromegaly appreciated. Cor: PMI nondisplaced. Regular rate & rhythm. No rubs, gallops or murmurs. Lungs: clear Abdomen: soft, nontender, nondistended. No hepatosplenomegaly. No bruits or masses.  Extremities: no cyanosis, clubbing, rash, edema. Bilateral arms w/ bruises, bandages around bilateral ankles Neuro: alert & oriented x 3, cranial nerves grossly intact. moves all 4 extremities w/o difficulty. Affect pleasant.  ECG (rhythm strip): NSR with PAC's, HR 57 (personally reviewed)   ASSESSMENT &  PLAN:  1: Ischemic heart failure with reduced ejection fraction- - PCI to LAD 08/21 - NYHA class III - euvolemic - not weighing daily - Echo  06/12/20: EF 30-35% with mild LVH, Grade II, severe hypokinesis of the left ventricular, mid-apical anteroseptal wall, anterior wall and inferior wall. Wall motion is suggestive of an LAD infarct vs. stress induced cardiomyopathy, moderate MR  - have scheduled updated echo for 01/25 - continue metoprolol succinate 12.5mg  daily - stop lisinopril & begin entresto 24/26mg  BID - other med changes depending on echo results - BMET next visit - discussed social worker referral for possible housing assistance to relocate but patient declines at this time - BNP 06/11/20 was 1621.3  2: HTN- - BP 203/86, 218/97, 154/70 (manually) - saw PCP Talbert Forest) 11/24 - changing lisinopril to entresto per above - BMP 10/01/23 showed sodium 136, potassium 4.7, creatinine 1.02 & GFR 54 - BMET next visit  3: CAD- - PCI to LAD 08/21 - saw cardiology Fransico Michael) 09/21  - needs [facility] f/u scheduled  4: Hyperlipidemia- - LDL 10/01/23 was 78  5: Atrial fibrillation- - EKG today was NSR with PAC's   Return here 1 week after echo, sooner if needed.    Delma Freeze, FNP 10/17/23

## 2023-10-21 ENCOUNTER — Ambulatory Visit (INDEPENDENT_AMBULATORY_CARE_PROVIDER_SITE_OTHER): Payer: 59 | Admitting: Nurse Practitioner

## 2023-10-21 DIAGNOSIS — Z7982 Long term (current) use of aspirin: Secondary | ICD-10-CM | POA: Diagnosis not present

## 2023-10-21 DIAGNOSIS — Z791 Long term (current) use of non-steroidal anti-inflammatories (NSAID): Secondary | ICD-10-CM | POA: Diagnosis not present

## 2023-10-21 DIAGNOSIS — I872 Venous insufficiency (chronic) (peripheral): Secondary | ICD-10-CM | POA: Diagnosis not present

## 2023-10-21 DIAGNOSIS — Z556 Problems related to health literacy: Secondary | ICD-10-CM | POA: Diagnosis not present

## 2023-10-21 DIAGNOSIS — Z9181 History of falling: Secondary | ICD-10-CM | POA: Diagnosis not present

## 2023-10-21 DIAGNOSIS — E569 Vitamin deficiency, unspecified: Secondary | ICD-10-CM | POA: Diagnosis not present

## 2023-10-21 DIAGNOSIS — I5022 Chronic systolic (congestive) heart failure: Secondary | ICD-10-CM | POA: Diagnosis not present

## 2023-10-21 DIAGNOSIS — I2510 Atherosclerotic heart disease of native coronary artery without angina pectoris: Secondary | ICD-10-CM | POA: Diagnosis not present

## 2023-10-21 DIAGNOSIS — I739 Peripheral vascular disease, unspecified: Secondary | ICD-10-CM | POA: Diagnosis not present

## 2023-10-21 DIAGNOSIS — Z7902 Long term (current) use of antithrombotics/antiplatelets: Secondary | ICD-10-CM | POA: Diagnosis not present

## 2023-10-21 DIAGNOSIS — L97422 Non-pressure chronic ulcer of left heel and midfoot with fat layer exposed: Secondary | ICD-10-CM | POA: Diagnosis not present

## 2023-10-21 DIAGNOSIS — I4891 Unspecified atrial fibrillation: Secondary | ICD-10-CM | POA: Diagnosis not present

## 2023-10-21 DIAGNOSIS — L97812 Non-pressure chronic ulcer of other part of right lower leg with fat layer exposed: Secondary | ICD-10-CM | POA: Diagnosis not present

## 2023-10-21 DIAGNOSIS — D649 Anemia, unspecified: Secondary | ICD-10-CM | POA: Diagnosis not present

## 2023-10-21 DIAGNOSIS — I110 Hypertensive heart disease with heart failure: Secondary | ICD-10-CM | POA: Diagnosis not present

## 2023-10-22 ENCOUNTER — Ambulatory Visit (INDEPENDENT_AMBULATORY_CARE_PROVIDER_SITE_OTHER): Payer: 59 | Admitting: Family

## 2023-10-22 ENCOUNTER — Encounter: Payer: Self-pay | Admitting: Family

## 2023-10-22 VITALS — BP 130/80 | HR 81 | Ht 66.5 in | Wt 105.0 lb

## 2023-10-22 DIAGNOSIS — Z0000 Encounter for general adult medical examination without abnormal findings: Secondary | ICD-10-CM

## 2023-10-22 DIAGNOSIS — I10 Essential (primary) hypertension: Secondary | ICD-10-CM | POA: Diagnosis not present

## 2023-10-22 MED ORDER — VALSARTAN 40 MG PO TABS: 40.0000 mg | ORAL_TABLET | Freq: Two times a day (BID) | ORAL | 1 refills | Status: DC

## 2023-10-22 MED ORDER — VALSARTAN 40 MG PO TABS: 40.0000 mg | ORAL_TABLET | Freq: Every day | ORAL | 1 refills | Status: DC

## 2023-10-23 ENCOUNTER — Telehealth: Payer: Self-pay

## 2023-10-23 DIAGNOSIS — Z7902 Long term (current) use of antithrombotics/antiplatelets: Secondary | ICD-10-CM | POA: Diagnosis not present

## 2023-10-23 DIAGNOSIS — L97422 Non-pressure chronic ulcer of left heel and midfoot with fat layer exposed: Secondary | ICD-10-CM | POA: Diagnosis not present

## 2023-10-23 DIAGNOSIS — I5022 Chronic systolic (congestive) heart failure: Secondary | ICD-10-CM | POA: Diagnosis not present

## 2023-10-23 DIAGNOSIS — Z9181 History of falling: Secondary | ICD-10-CM | POA: Diagnosis not present

## 2023-10-23 DIAGNOSIS — I4891 Unspecified atrial fibrillation: Secondary | ICD-10-CM | POA: Diagnosis not present

## 2023-10-23 DIAGNOSIS — E569 Vitamin deficiency, unspecified: Secondary | ICD-10-CM | POA: Diagnosis not present

## 2023-10-23 DIAGNOSIS — I110 Hypertensive heart disease with heart failure: Secondary | ICD-10-CM | POA: Diagnosis not present

## 2023-10-23 DIAGNOSIS — L97812 Non-pressure chronic ulcer of other part of right lower leg with fat layer exposed: Secondary | ICD-10-CM | POA: Diagnosis not present

## 2023-10-23 DIAGNOSIS — Z7982 Long term (current) use of aspirin: Secondary | ICD-10-CM | POA: Diagnosis not present

## 2023-10-23 DIAGNOSIS — Z791 Long term (current) use of non-steroidal anti-inflammatories (NSAID): Secondary | ICD-10-CM | POA: Diagnosis not present

## 2023-10-23 DIAGNOSIS — I739 Peripheral vascular disease, unspecified: Secondary | ICD-10-CM | POA: Diagnosis not present

## 2023-10-23 DIAGNOSIS — D649 Anemia, unspecified: Secondary | ICD-10-CM | POA: Diagnosis not present

## 2023-10-23 DIAGNOSIS — I872 Venous insufficiency (chronic) (peripheral): Secondary | ICD-10-CM | POA: Diagnosis not present

## 2023-10-23 DIAGNOSIS — Z556 Problems related to health literacy: Secondary | ICD-10-CM | POA: Diagnosis not present

## 2023-10-23 DIAGNOSIS — I2510 Atherosclerotic heart disease of native coronary artery without angina pectoris: Secondary | ICD-10-CM | POA: Diagnosis not present

## 2023-10-23 NOTE — Telephone Encounter (Signed)
[name] [name] with [facility] called to inform you that she went out to see pt today and she was extremely upset about her sister, and agitated with her phone. While checking her BP in was 180/90 and [name] [name] told her that her BP gets high when she is upset- she did have a friend over there trying to help her.

## 2023-10-24 ENCOUNTER — Telehealth: Payer: Self-pay

## 2023-10-24 NOTE — Telephone Encounter (Signed)
Pt called to inform you that her eye Dr called her yesterday to inform her that she has facial cell carcinoma- they plan to do surgery in the near future

## 2023-10-28 ENCOUNTER — Telehealth (INDEPENDENT_AMBULATORY_CARE_PROVIDER_SITE_OTHER): Payer: Self-pay

## 2023-10-28 DIAGNOSIS — I739 Peripheral vascular disease, unspecified: Secondary | ICD-10-CM | POA: Diagnosis not present

## 2023-10-28 DIAGNOSIS — Z7982 Long term (current) use of aspirin: Secondary | ICD-10-CM | POA: Diagnosis not present

## 2023-10-28 DIAGNOSIS — I872 Venous insufficiency (chronic) (peripheral): Secondary | ICD-10-CM | POA: Diagnosis not present

## 2023-10-28 DIAGNOSIS — L97422 Non-pressure chronic ulcer of left heel and midfoot with fat layer exposed: Secondary | ICD-10-CM | POA: Diagnosis not present

## 2023-10-28 DIAGNOSIS — I110 Hypertensive heart disease with heart failure: Secondary | ICD-10-CM | POA: Diagnosis not present

## 2023-10-28 DIAGNOSIS — Z9181 History of falling: Secondary | ICD-10-CM | POA: Diagnosis not present

## 2023-10-28 DIAGNOSIS — Z7902 Long term (current) use of antithrombotics/antiplatelets: Secondary | ICD-10-CM | POA: Diagnosis not present

## 2023-10-28 DIAGNOSIS — D649 Anemia, unspecified: Secondary | ICD-10-CM | POA: Diagnosis not present

## 2023-10-28 DIAGNOSIS — I4891 Unspecified atrial fibrillation: Secondary | ICD-10-CM | POA: Diagnosis not present

## 2023-10-28 DIAGNOSIS — L97812 Non-pressure chronic ulcer of other part of right lower leg with fat layer exposed: Secondary | ICD-10-CM | POA: Diagnosis not present

## 2023-10-28 DIAGNOSIS — I2510 Atherosclerotic heart disease of native coronary artery without angina pectoris: Secondary | ICD-10-CM | POA: Diagnosis not present

## 2023-10-28 DIAGNOSIS — I5022 Chronic systolic (congestive) heart failure: Secondary | ICD-10-CM | POA: Diagnosis not present

## 2023-10-28 DIAGNOSIS — E569 Vitamin deficiency, unspecified: Secondary | ICD-10-CM | POA: Diagnosis not present

## 2023-10-28 DIAGNOSIS — Z556 Problems related to health literacy: Secondary | ICD-10-CM | POA: Diagnosis not present

## 2023-10-28 DIAGNOSIS — Z791 Long term (current) use of non-steroidal anti-inflammatories (NSAID): Secondary | ICD-10-CM | POA: Diagnosis not present

## 2023-10-28 NOTE — Telephone Encounter (Signed)
[name] LPN with [facility] left a message stating that the patient has two new wounds on the feet. The patient was recommended to contact podiatry for an appointment. Between the great toe and 2nd toe on the right foot there is a deep slit and on the left foot the 2nd toe has stage 2 wound. The wounds our office is treating looks little better. The provider has been made aware with report.

## 2023-10-29 ENCOUNTER — Inpatient Hospital Stay: Payer: 59 | Attending: Oncology | Admitting: Oncology

## 2023-10-29 ENCOUNTER — Other Ambulatory Visit: Payer: 59

## 2023-10-29 ENCOUNTER — Encounter: Payer: Self-pay | Admitting: Oncology

## 2023-10-29 VITALS — BP 179/95 | HR 87 | Temp 98.1°F | Resp 16 | Wt 105.6 lb

## 2023-10-29 DIAGNOSIS — Z803 Family history of malignant neoplasm of breast: Secondary | ICD-10-CM | POA: Diagnosis not present

## 2023-10-29 DIAGNOSIS — Z87891 Personal history of nicotine dependence: Secondary | ICD-10-CM | POA: Insufficient documentation

## 2023-10-29 DIAGNOSIS — L97909 Non-pressure chronic ulcer of unspecified part of unspecified lower leg with unspecified severity: Secondary | ICD-10-CM | POA: Insufficient documentation

## 2023-10-29 DIAGNOSIS — C44319 Basal cell carcinoma of skin of other parts of face: Secondary | ICD-10-CM | POA: Insufficient documentation

## 2023-10-29 DIAGNOSIS — C44310 Basal cell carcinoma of skin of unspecified parts of face: Secondary | ICD-10-CM

## 2023-10-29 DIAGNOSIS — I10 Essential (primary) hypertension: Secondary | ICD-10-CM | POA: Insufficient documentation

## 2023-10-29 NOTE — Progress Notes (Signed)
[facility]  [phone] [phone]  ID: [name] OB: 08-12-1935  MR#: [id number]  WUX#:[id number]  Patient Care Team: Miki Kins, FNP as PCP - General (Family Medicine) Iran Ouch, MD as PCP - Cardiology (Cardiology)  CHIEF COMPLAINT: Basal cell carcinoma.  INTERVAL HISTORY: Patient is an 88 year old female with a known basal cell carcinoma on the right bridge of her nose and right temple.  This is not surgically amenable and the patient is referred for discussion of treatment options.  She currently feels well and at her baseline.  She has no neurologic complaints.  She denies any recent fevers or illnesses.  She has a good appetite and denies weight loss.  She has no chest pain, shortness of breath, cough, or hemoptysis.  She denies any nausea, vomiting, constipation, or diarrhea.  She has no urinary complaints.  Patient offers no further specific complaints today.  REVIEW OF SYSTEMS:   Review of Systems  Constitutional: Negative.  Negative for fever, malaise/fatigue and weight loss.  Respiratory: Negative.  Negative for cough and shortness of breath.   Cardiovascular: Negative.  Negative for chest pain and leg swelling.  Gastrointestinal: Negative.  Negative for abdominal pain.  Genitourinary: Negative.  Negative for dysuria.  Musculoskeletal: Negative.  Negative for back pain.  Skin: Negative.  Negative for rash.  Neurological: Negative.  Negative for dizziness, focal weakness, weakness and headaches.  Psychiatric/Behavioral: Negative.  The patient is not nervous/anxious.     As per HPI. Otherwise, a complete review of systems is negative.  PAST MEDICAL HISTORY: Past Medical History:  Diagnosis Date   AKI (acute kidney injury) (HCC) 02/20/2023   Atrial fibrillation (HCC)    Basal cell carcinoma 2021   L temple   Chronic back pain    Congestive heart failure (CHF) (HCC)    History of total hip replacement (Bilateral)  01/12/2020   Left hip replacement done in 2019.  Right hip replacement done in 2012.   Hypertension    Hyponatremia 06/11/2020   Melena 02/21/2023   NSTEMI (non-ST elevated myocardial infarction) (HCC) 06/11/2020   Pharmacologic therapy 01/12/2020   Problems influencing health status 01/12/2020   Scoliosis    Upper GI bleeding 02/21/2023    PAST SURGICAL HISTORY: Past Surgical History:  Procedure Laterality Date   ABDOMINAL SURGERY     bilateral hip replacement     CORONARY STENT INTERVENTION N/A 06/12/2020   Procedure: CORONARY STENT INTERVENTION;  Surgeon: Iran Ouch, MD;  Location: [facility];  Service: Cardiovascular;  Laterality: N/A;  LAD   ESOPHAGOGASTRODUODENOSCOPY (EGD) WITH PROPOFOL N/A 02/24/2023   Procedure: ESOPHAGOGASTRODUODENOSCOPY (EGD) WITH PROPOFOL;  Surgeon: Midge Minium, MD;  Location: [facility] ENDOSCOPY;  Service: Endoscopy;  Laterality: N/A;   LEFT HEART CATH AND CORONARY ANGIOGRAPHY N/A 06/12/2020   Procedure: LEFT HEART CATH AND CORONARY ANGIOGRAPHY;  Surgeon: Iran Ouch, MD;  Location: [facility] CV [facility];  Service: Cardiovascular;  Laterality: N/A;   LOWER EXTREMITY ANGIOGRAPHY Right 02/25/2023   Procedure: Lower Extremity Angiography;  Surgeon: Renford Dills, MD;  Location: [facility];  Service: Cardiovascular;  Laterality: Right;   TONSILLECTOMY      FAMILY HISTORY: Family History  Problem Relation Age of Onset   Breast cancer Mother    CVA Father    Heart disease Father    Arthritis/Rheumatoid Sister    Breast cancer Paternal Grandmother     ADVANCED DIRECTIVES (Y/N):  N  HEALTH MAINTENANCE: Social History  Tobacco Use   Smoking status: Former    Current packs/day: 0.00    Average packs/day: 0.5 packs/day for 68.0 years (34.0 ttl pk-yrs)    Types: Cigarettes    Start date: 06/11/1952    Quit date: 06/11/2020    Years since quitting: 3.3   Smokeless tobacco: Never  Substance Use Topics   Alcohol use: Not  Currently   Drug use: Not Currently     Colonoscopy:  PAP:  Bone density:  Lipid panel:  Allergies  Allergen Reactions   Spironolactone Anaphylaxis   Fluogen [Influenza Virus Vaccine] Hives   Statins     Dizziness & weakness   Sulfa Antibiotics Other (See Comments)    Unknown, happened as a child    Current Outpatient Medications  Medication Sig Dispense Refill   aspirin EC 81 MG tablet Take 1 tablet (81 mg total) by mouth daily. Swallow whole. 30 tablet 0   clopidogrel (PLAVIX) 75 MG tablet TAKE 1 TABLET BY MOUTH ONCE DAILY 30 tablet 8   erythromycin ophthalmic ointment Place 3.5 g into both eyes as needed.     feeding supplement (ENSURE ENLIVE / ENSURE PLUS) LIQD Take 237 mLs by mouth 2 (two) times daily between meals. 237 mL 12   Ferrous Sulfate (IRON) 325 (65 Fe) MG TABS TAKE 1 TABLET BY MOUTH ONCE DAILY 30 tablet 8   gabapentin (NEURONTIN) 400 MG capsule TAKE 1 CAPSULE BY MOUTH FOUR TIMES DAILY 120 capsule 8   meloxicam (MOBIC) 15 MG tablet TAKE 1 TABLET BY MOUTH ONCE DAILY 30 tablet 8   metoprolol succinate (TOPROL-XL) 25 MG 24 hr tablet TAKE 1 TABLET BY MOUTH ONCE DAILY 30 tablet 8   oxyCODONE-acetaminophen (PERCOCET) 10-325 MG tablet Take 1 tablet by mouth every 6 (six) hours as needed. 120 tablet 0   terbinafine (LAMISIL) 250 MG tablet Take 250 mg by mouth daily.     valsartan (DIOVAN) 40 MG tablet Take 1 tablet (40 mg total) by mouth 2 (two) times daily. 60 tablet 1   No current facility-administered medications for this visit.    OBJECTIVE: Vitals:   10/29/23 1145  BP: (!) 179/95  Pulse: 87  Resp: 16  Temp: 98.1 F (36.7 C)  SpO2: 100%     Body mass index is 16.79 kg/m.    ECOG FS:0 - Asymptomatic  General: Well-developed, well-nourished, no acute distress. Eyes: Pink conjunctiva, anicteric sclera. HEENT: Normocephalic, moist mucous membranes. Lungs: No audible wheezing or coughing. Heart: Regular rate and rhythm. Abdomen: Soft, nontender, no obvious  distention. Musculoskeletal: No edema, cyanosis, or clubbing. Neuro: Alert, answering all questions appropriately. Cranial nerves grossly intact. Skin: No rashes or petechiae noted.  Lesions consistent with basal cell carcinoma on bridge of nose and right temple area. Psych: Normal affect. Lymphatics: No cervical, calvicular, axillary or inguinal LAD.   LAB RESULTS:  Lab Results  Component Value Date   NA 136 10/01/2023   K 4.7 10/01/2023   CL 97 10/01/2023   CO2 26 10/01/2023   GLUCOSE 79 10/01/2023   BUN 15 10/01/2023   CREATININE 1.02 (H) 10/01/2023   CALCIUM 9.0 10/01/2023   PROT 6.9 10/01/2023   ALBUMIN 4.3 10/01/2023   AST 30 10/01/2023   ALT 22 10/01/2023   ALKPHOS 127 (H) 10/01/2023   BILITOT 0.3 10/01/2023   GFRNONAA >60 02/28/2023   GFRAA >60 06/14/2020    Lab Results  Component Value Date   WBC 7.5 10/01/2023   NEUTROABS 6.1 10/01/2023   HGB 10.6 (  L) 10/01/2023   HCT 33.8 (L) 10/01/2023   MCV 93 10/01/2023   PLT 313 10/01/2023     STUDIES: No results found.  ASSESSMENT: Basal cell carcinoma.  PLAN:    Basal cell carcinoma: Lesions on patient's face and not surgically amenable.  We discussed the possibility of systemic treatment either with a hedgehog inhibitor or immunotherapy, neither of which patient is interested in at this time.  Hedgehog inhibitor may be too toxic given her age and underlying cardiac disease.  While immunotherapy would have minimal side effects, patient is not interested in IV treatment for 1 to 2 years.  A referral has been made to radiation oncology for consideration of XRT for local control disease.  If local therapy is not successful, can reconsider systemic treatment at that time.  Return to clinic at the conclusion of XRT for further evaluation.   Hypertension: Patient's blood pressure is significantly elevated today.  Continue monitoring and treatment per primary care.   Lower extremity ulcers: Secondary to poor circulation.   Continue follow-up with vascular surgery as scheduled.    I spent a total of 60 minutes reviewing chart data, face-to-face evaluation with the patient, counseling and coordination of care as detailed above.   Patient expressed understanding and was in agreement with this plan. She also understands that She can call clinic at any time with any questions, concerns, or complaints.    Cancer Staging  No matching staging information was found for the patient.   Jeralyn Ruths, MD   10/29/2023 11:55 AM

## 2023-10-29 NOTE — Telephone Encounter (Signed)
Pt called stated that she only wants to use [facility] so I updated her chart-HQ

## 2023-10-29 NOTE — Progress Notes (Signed)
Pt sister is with her today, patient reports she has someone come in to help with her dressings to her toes and feet as well as fill pill box.  Pt is unable to recall medications.  RN documented unknown and sent printed med list with patient to verify medications at home and bring back to next visit.  Pt verbalized understanding.

## 2023-10-30 ENCOUNTER — Ambulatory Visit: Payer: 59 | Admitting: Podiatry

## 2023-10-30 DIAGNOSIS — Z556 Problems related to health literacy: Secondary | ICD-10-CM | POA: Diagnosis not present

## 2023-10-30 DIAGNOSIS — I110 Hypertensive heart disease with heart failure: Secondary | ICD-10-CM | POA: Diagnosis not present

## 2023-10-30 DIAGNOSIS — I872 Venous insufficiency (chronic) (peripheral): Secondary | ICD-10-CM | POA: Diagnosis not present

## 2023-10-30 DIAGNOSIS — L97422 Non-pressure chronic ulcer of left heel and midfoot with fat layer exposed: Secondary | ICD-10-CM | POA: Diagnosis not present

## 2023-10-30 DIAGNOSIS — Z9181 History of falling: Secondary | ICD-10-CM | POA: Diagnosis not present

## 2023-10-30 DIAGNOSIS — E569 Vitamin deficiency, unspecified: Secondary | ICD-10-CM | POA: Diagnosis not present

## 2023-10-30 DIAGNOSIS — I2510 Atherosclerotic heart disease of native coronary artery without angina pectoris: Secondary | ICD-10-CM | POA: Diagnosis not present

## 2023-10-30 DIAGNOSIS — Z791 Long term (current) use of non-steroidal anti-inflammatories (NSAID): Secondary | ICD-10-CM | POA: Diagnosis not present

## 2023-10-30 DIAGNOSIS — L97812 Non-pressure chronic ulcer of other part of right lower leg with fat layer exposed: Secondary | ICD-10-CM | POA: Diagnosis not present

## 2023-10-30 DIAGNOSIS — Z7982 Long term (current) use of aspirin: Secondary | ICD-10-CM | POA: Diagnosis not present

## 2023-10-30 DIAGNOSIS — I4891 Unspecified atrial fibrillation: Secondary | ICD-10-CM | POA: Diagnosis not present

## 2023-10-30 DIAGNOSIS — I739 Peripheral vascular disease, unspecified: Secondary | ICD-10-CM | POA: Diagnosis not present

## 2023-10-30 DIAGNOSIS — I5022 Chronic systolic (congestive) heart failure: Secondary | ICD-10-CM | POA: Diagnosis not present

## 2023-10-30 DIAGNOSIS — D649 Anemia, unspecified: Secondary | ICD-10-CM | POA: Diagnosis not present

## 2023-10-30 DIAGNOSIS — Z7902 Long term (current) use of antithrombotics/antiplatelets: Secondary | ICD-10-CM | POA: Diagnosis not present

## 2023-10-31 ENCOUNTER — Other Ambulatory Visit (INDEPENDENT_AMBULATORY_CARE_PROVIDER_SITE_OTHER): Payer: Self-pay | Admitting: Nurse Practitioner

## 2023-10-31 ENCOUNTER — Ambulatory Visit (INDEPENDENT_AMBULATORY_CARE_PROVIDER_SITE_OTHER): Payer: 59 | Admitting: Nurse Practitioner

## 2023-10-31 ENCOUNTER — Encounter (INDEPENDENT_AMBULATORY_CARE_PROVIDER_SITE_OTHER): Payer: Self-pay | Admitting: Nurse Practitioner

## 2023-10-31 VITALS — BP 194/92 | HR 78 | Resp 18 | Ht 66.5 in | Wt 107.0 lb

## 2023-10-31 DIAGNOSIS — I10 Essential (primary) hypertension: Secondary | ICD-10-CM

## 2023-10-31 DIAGNOSIS — I739 Peripheral vascular disease, unspecified: Secondary | ICD-10-CM | POA: Diagnosis not present

## 2023-10-31 DIAGNOSIS — S81801D Unspecified open wound, right lower leg, subsequent encounter: Secondary | ICD-10-CM | POA: Diagnosis not present

## 2023-10-31 DIAGNOSIS — Z9889 Other specified postprocedural states: Secondary | ICD-10-CM

## 2023-10-31 MED ORDER — DOXYCYCLINE HYCLATE 100 MG PO CAPS: 100.0000 mg | ORAL_CAPSULE | Freq: Two times a day (BID) | ORAL | 0 refills | Status: DC

## 2023-11-02 NOTE — Progress Notes (Signed)
Subjective:    Patient ID: [name] [name], female    DOB: 07/06/1935, 88 y.o.   MRN: [id number] Chief Complaint  Patient presents with   Follow-up    5 week unna f/u    The patient returns today for evaluation of her bilateral ankle wounds.  The left wound shows some slight improvement but the right seems to be worse.  In addition she has also developed a wound between her first and second toes of her right foot.  The patient continues not to want to be placed into the wraps.     Review of Systems  Musculoskeletal:  Positive for gait problem.  Skin:  Positive for wound.  All other systems reviewed and are negative.      Objective:   Physical Exam Vitals reviewed.  HENT:     Head: Normocephalic.  Cardiovascular:     Rate and Rhythm: Normal rate.  Pulmonary:     Effort: Pulmonary effort is normal.  Skin:    General: Skin is warm and dry.  Neurological:     Mental Status: She is alert and oriented to person, place, and time.  Psychiatric:        Mood and Affect: Mood normal.        Behavior: Behavior normal.        Thought Content: Thought content normal.        Judgment: Judgment normal.     BP (!) 194/92   Pulse 78   Resp 18   Ht 5' 6.5" (1.689 m)   Wt 107 lb (48.5 kg)   BMI 17.01 kg/m   Past Medical History:  Diagnosis Date   AKI (acute kidney injury) (HCC) 02/20/2023   Atrial fibrillation (HCC)    Basal cell carcinoma 2021   L temple   Chronic back pain    Congestive heart failure (CHF) (HCC)    History of total hip replacement (Bilateral) 01/12/2020   Left hip replacement done in 2019.  Right hip replacement done in 2012.   Hypertension    Hyponatremia 06/11/2020   Melena 02/21/2023   NSTEMI (non-ST elevated myocardial infarction) (HCC) 06/11/2020   Pharmacologic therapy 01/12/2020   Problems influencing health status 01/12/2020   Scoliosis    Upper GI bleeding 02/21/2023    Social History   Socioeconomic History   Marital status: Single     Spouse name: Not on file   Number of children: 0   Years of education: Not on file   Highest education level: Not on file  Occupational History   Not on file  Tobacco Use   Smoking status: Former    Current packs/day: 0.00    Average packs/day: 0.5 packs/day for 68.0 years (34.0 ttl pk-yrs)    Types: Cigarettes    Start date: 06/11/1952    Quit date: 06/11/2020    Years since quitting: 3.3   Smokeless tobacco: Never  Substance and Sexual Activity   Alcohol use: Not Currently   Drug use: Not Currently   Sexual activity: Not on file  Other Topics Concern   Not on file  Social History Narrative   Not on file   Social Drivers of Health   Financial Resource Strain: Not on file  Food Insecurity: No Food Insecurity (10/29/2023)   Hunger Vital Sign    Worried About Running Out of Food in the Last Year: Never true    Ran Out of Food in the Last Year: Never true  Transportation Needs: No Transportation  Needs (10/29/2023)   PRAPARE - Administrator, Civil Service (Medical): No    Lack of Transportation (Non-Medical): No  Physical Activity: Insufficiently Active (12/25/2022)   Exercise Vital Sign    Days of Exercise per Week: 7 days    Minutes of Exercise per Session: 20 min  Stress: Stress Concern Present (12/25/2022)   [employer] of Occupational Health - Occupational Stress Questionnaire    Feeling of Stress : Very much  Social Connections: Socially Isolated (12/25/2022)   Social Connection and Isolation Panel [NHANES]    Frequency of Communication with Friends and Family: Never    Frequency of Social Gatherings with Friends and Family: Never    Attends Religious Services: Never    Database administrator or Organizations: No    Attends Banker Meetings: Never    Marital Status: Never married  Intimate Partner Violence: Not At Risk (10/29/2023)   Humiliation, Afraid, Rape, and Kick questionnaire    Fear of Current or Ex-Partner: No    Emotionally  Abused: No    Physically Abused: No    Sexually Abused: No    Past Surgical History:  Procedure Laterality Date   ABDOMINAL SURGERY     bilateral hip replacement     CORONARY STENT INTERVENTION N/A 06/12/2020   Procedure: CORONARY STENT INTERVENTION;  Surgeon: Iran Ouch, MD;  Location: [facility];  Service: Cardiovascular;  Laterality: N/A;  LAD   ESOPHAGOGASTRODUODENOSCOPY (EGD) WITH PROPOFOL N/A 02/24/2023   Procedure: ESOPHAGOGASTRODUODENOSCOPY (EGD) WITH PROPOFOL;  Surgeon: Midge Minium, MD;  Location: [facility];  Service: Endoscopy;  Laterality: N/A;   LEFT HEART CATH AND CORONARY ANGIOGRAPHY N/A 06/12/2020   Procedure: LEFT HEART CATH AND CORONARY ANGIOGRAPHY;  Surgeon: Iran Ouch, MD;  Location: [facility];  Service: Cardiovascular;  Laterality: N/A;   LOWER EXTREMITY ANGIOGRAPHY Right 02/25/2023   Procedure: Lower Extremity Angiography;  Surgeon: Renford Dills, MD;  Location: [facility];  Service: Cardiovascular;  Laterality: Right;   TONSILLECTOMY      Family History  Problem Relation Age of Onset   Breast cancer Mother    CVA Father    Heart disease Father    Arthritis/Rheumatoid Sister    Breast cancer Paternal Grandmother     Allergies  Allergen Reactions   Spironolactone Anaphylaxis   Fluogen [Influenza Virus Vaccine] Hives   Statins     Dizziness & weakness   Sulfa Antibiotics Other (See Comments)    Unknown, happened as a child       Latest Ref Rng & Units 10/01/2023   11:19 AM 03/01/2023    4:53 AM 02/28/2023    4:38 AM  CBC  WBC 3.4 - 10.8 x10E3/uL 7.5  7.7  9.0   Hemoglobin 11.1 - 15.9 g/dL 16.1  9.0  8.3   Hematocrit 34.0 - 46.6 % 33.8  30.2  27.1   Platelets 150 - 450 x10E3/uL 313  279  285       CMP     Component Value Date/Time   NA 136 10/01/2023 1119   K 4.7 10/01/2023 1119   CL 97 10/01/2023 1119   CO2 26 10/01/2023 1119   GLUCOSE 79 10/01/2023 1119   GLUCOSE 88 02/28/2023 0438   BUN  15 10/01/2023 1119   CREATININE 1.02 (H) 10/01/2023 1119   CALCIUM 9.0 10/01/2023 1119   PROT 6.9 10/01/2023 1119   ALBUMIN 4.3 10/01/2023 1119   AST 30 10/01/2023  1119   ALT 22 10/01/2023 1119   ALKPHOS 127 (H) 10/01/2023 1119   BILITOT 0.3 10/01/2023 1119   EGFR 54 (L) 10/01/2023 1119   GFRNONAA >60 02/28/2023 0438     No results found.     Assessment & Plan:   1. Open wound of lower leg, right, subsequent encounter (Primary) I had a very long discussion with the patient regarding her wounds.  She has developed a new wound between her first and second toes which is more traumatic than venous ulceration.  In addition the previous was that she had of seemingly worsened versus getting better.  I we will be in her best interest to go to wound care given that she is not a fan of Unna wraps and the previous interventions we have tried have not shown significant success.  Explained to her that she will be able to still keep some home health nurse.  Today we will culture the wound between the first and second toe.  She is seeing her podiatrist in a few days for this as well.  I have also placed her on doxycycline given the drainage and redness around the wound area itself.  Will have her return in several weeks to continue to evaluate the wound until this can be taken over by wound care.  2. Peripheral arterial disease with history of revascularization (HCC) When patient returns we will have her ABIs checked as they were adequate for wound perfusion several months ago but given the wounds slow healing status will reevaluate.  3. Primary hypertension Continue antihypertensive medications as already ordered, these medications have been reviewed and there are no changes at this time.   Current Outpatient Medications on File Prior to Visit  Medication Sig Dispense Refill   aspirin EC 81 MG tablet Take 1 tablet (81 mg total) by mouth daily. Swallow whole. 30 tablet 0   clopidogrel (PLAVIX) 75 MG  tablet TAKE 1 TABLET BY MOUTH ONCE DAILY 30 tablet 8   erythromycin ophthalmic ointment Place 3.5 g into both eyes as needed.     feeding supplement (ENSURE ENLIVE / ENSURE PLUS) LIQD Take 237 mLs by mouth 2 (two) times daily between meals. 237 mL 12   Ferrous Sulfate (IRON) 325 (65 Fe) MG TABS TAKE 1 TABLET BY MOUTH ONCE DAILY 30 tablet 8   gabapentin (NEURONTIN) 400 MG capsule TAKE 1 CAPSULE BY MOUTH FOUR TIMES DAILY 120 capsule 8   lisinopril (ZESTRIL) 5 MG tablet Take 5 mg by mouth daily.     meloxicam (MOBIC) 15 MG tablet TAKE 1 TABLET BY MOUTH ONCE DAILY 30 tablet 8   metoprolol succinate (TOPROL-XL) 25 MG 24 hr tablet TAKE 1 TABLET BY MOUTH ONCE DAILY 30 tablet 8   oxyCODONE-acetaminophen (PERCOCET) 10-325 MG tablet Take 1 tablet by mouth every 6 (six) hours as needed. 120 tablet 0   terbinafine (LAMISIL) 250 MG tablet Take 250 mg by mouth daily.     valsartan (DIOVAN) 40 MG tablet Take 1 tablet (40 mg total) by mouth 2 (two) times daily. 60 tablet 1   No current facility-administered medications on file prior to visit.    There are no Patient Instructions on file for this visit. No follow-ups on file.   Georgiana Spinner, NP

## 2023-11-03 DIAGNOSIS — I2510 Atherosclerotic heart disease of native coronary artery without angina pectoris: Secondary | ICD-10-CM | POA: Diagnosis not present

## 2023-11-03 DIAGNOSIS — D649 Anemia, unspecified: Secondary | ICD-10-CM | POA: Diagnosis not present

## 2023-11-03 DIAGNOSIS — I739 Peripheral vascular disease, unspecified: Secondary | ICD-10-CM | POA: Diagnosis not present

## 2023-11-03 DIAGNOSIS — Z7982 Long term (current) use of aspirin: Secondary | ICD-10-CM | POA: Diagnosis not present

## 2023-11-03 DIAGNOSIS — L97812 Non-pressure chronic ulcer of other part of right lower leg with fat layer exposed: Secondary | ICD-10-CM | POA: Diagnosis not present

## 2023-11-03 DIAGNOSIS — E569 Vitamin deficiency, unspecified: Secondary | ICD-10-CM | POA: Diagnosis not present

## 2023-11-03 DIAGNOSIS — I5022 Chronic systolic (congestive) heart failure: Secondary | ICD-10-CM | POA: Diagnosis not present

## 2023-11-03 DIAGNOSIS — I4891 Unspecified atrial fibrillation: Secondary | ICD-10-CM | POA: Diagnosis not present

## 2023-11-03 DIAGNOSIS — Z556 Problems related to health literacy: Secondary | ICD-10-CM | POA: Diagnosis not present

## 2023-11-03 DIAGNOSIS — Z791 Long term (current) use of non-steroidal anti-inflammatories (NSAID): Secondary | ICD-10-CM | POA: Diagnosis not present

## 2023-11-03 DIAGNOSIS — Z7902 Long term (current) use of antithrombotics/antiplatelets: Secondary | ICD-10-CM | POA: Diagnosis not present

## 2023-11-03 DIAGNOSIS — L97422 Non-pressure chronic ulcer of left heel and midfoot with fat layer exposed: Secondary | ICD-10-CM | POA: Diagnosis not present

## 2023-11-03 DIAGNOSIS — I872 Venous insufficiency (chronic) (peripheral): Secondary | ICD-10-CM | POA: Diagnosis not present

## 2023-11-03 DIAGNOSIS — Z9181 History of falling: Secondary | ICD-10-CM | POA: Diagnosis not present

## 2023-11-03 DIAGNOSIS — I110 Hypertensive heart disease with heart failure: Secondary | ICD-10-CM | POA: Diagnosis not present

## 2023-11-06 LAB — AEROBIC CULTURE

## 2023-11-07 DIAGNOSIS — L97422 Non-pressure chronic ulcer of left heel and midfoot with fat layer exposed: Secondary | ICD-10-CM | POA: Diagnosis not present

## 2023-11-07 DIAGNOSIS — D649 Anemia, unspecified: Secondary | ICD-10-CM | POA: Diagnosis not present

## 2023-11-07 DIAGNOSIS — I4891 Unspecified atrial fibrillation: Secondary | ICD-10-CM | POA: Diagnosis not present

## 2023-11-07 DIAGNOSIS — Z7902 Long term (current) use of antithrombotics/antiplatelets: Secondary | ICD-10-CM | POA: Diagnosis not present

## 2023-11-07 DIAGNOSIS — Z791 Long term (current) use of non-steroidal anti-inflammatories (NSAID): Secondary | ICD-10-CM | POA: Diagnosis not present

## 2023-11-07 DIAGNOSIS — Z9181 History of falling: Secondary | ICD-10-CM | POA: Diagnosis not present

## 2023-11-07 DIAGNOSIS — I872 Venous insufficiency (chronic) (peripheral): Secondary | ICD-10-CM | POA: Diagnosis not present

## 2023-11-07 DIAGNOSIS — I5022 Chronic systolic (congestive) heart failure: Secondary | ICD-10-CM | POA: Diagnosis not present

## 2023-11-07 DIAGNOSIS — L97812 Non-pressure chronic ulcer of other part of right lower leg with fat layer exposed: Secondary | ICD-10-CM | POA: Diagnosis not present

## 2023-11-07 DIAGNOSIS — I739 Peripheral vascular disease, unspecified: Secondary | ICD-10-CM | POA: Diagnosis not present

## 2023-11-07 DIAGNOSIS — E569 Vitamin deficiency, unspecified: Secondary | ICD-10-CM | POA: Diagnosis not present

## 2023-11-07 DIAGNOSIS — I2510 Atherosclerotic heart disease of native coronary artery without angina pectoris: Secondary | ICD-10-CM | POA: Diagnosis not present

## 2023-11-07 DIAGNOSIS — Z556 Problems related to health literacy: Secondary | ICD-10-CM | POA: Diagnosis not present

## 2023-11-07 DIAGNOSIS — Z7982 Long term (current) use of aspirin: Secondary | ICD-10-CM | POA: Diagnosis not present

## 2023-11-07 DIAGNOSIS — I110 Hypertensive heart disease with heart failure: Secondary | ICD-10-CM | POA: Diagnosis not present

## 2023-11-08 NOTE — Progress Notes (Signed)
Annual Wellness Visit  Patient: [name] [name], Female    DOB: 12/15/1934, 88 y.o.   MRN: [id number] Visit Date: 11/08/2023  Today's Provider: Miki Kins, FNP  Subjective:    Chief Complaint  Patient presents with   Annual Exam    1 month and AWV   [name] [name] is a 88 y.o. female who presents today for her Annual Wellness Visit.  Her only concern is that the HF clinic started her on entresto, and she says it made her feel like she "was going to die".     Past Medical History:  Diagnosis Date   AKI (acute kidney injury) (HCC) 02/20/2023   Atrial fibrillation (HCC)    Basal cell carcinoma 2021   L temple   Chronic back pain    Congestive heart failure (CHF) (HCC)    History of total hip replacement (Bilateral) 01/12/2020   Left hip replacement done in 2019.  Right hip replacement done in 2012.   Hypertension    Hyponatremia 06/11/2020   Melena 02/21/2023   NSTEMI (non-ST elevated myocardial infarction) (HCC) 06/11/2020   Pharmacologic therapy 01/12/2020   Problems influencing health status 01/12/2020   Scoliosis    Upper GI bleeding 02/21/2023   Past Surgical History:  Procedure Laterality Date   ABDOMINAL SURGERY     bilateral hip replacement     CORONARY STENT INTERVENTION N/A 06/12/2020   Procedure: CORONARY STENT INTERVENTION;  Surgeon: Iran Ouch, MD;  Location: [facility];  Service: Cardiovascular;  Laterality: N/A;  LAD   ESOPHAGOGASTRODUODENOSCOPY (EGD) WITH PROPOFOL N/A 02/24/2023   Procedure: ESOPHAGOGASTRODUODENOSCOPY (EGD) WITH PROPOFOL;  Surgeon: Midge Minium, MD;  Location: [facility];  Service: Endoscopy;  Laterality: N/A;   LEFT HEART CATH AND CORONARY ANGIOGRAPHY N/A 06/12/2020   Procedure: LEFT HEART CATH AND CORONARY ANGIOGRAPHY;  Surgeon: Iran Ouch, MD;  Location: [facility];  Service: Cardiovascular;  Laterality: N/A;   LOWER EXTREMITY ANGIOGRAPHY Right 02/25/2023   Procedure: Lower Extremity Angiography;   Surgeon: Renford Dills, MD;  Location: [facility];  Service: Cardiovascular;  Laterality: Right;   TONSILLECTOMY     Family History  Problem Relation Age of Onset   Breast cancer Mother    CVA Father    Heart disease Father    Arthritis/Rheumatoid Sister    Breast cancer Paternal Grandmother    Social History   Socioeconomic History   Marital status: Single    Spouse name: Not on file   Number of children: 0   Years of education: Not on file   Highest education level: Not on file  Occupational History   Not on file  Tobacco Use   Smoking status: Former    Current packs/day: 0.00    Average packs/day: 0.5 packs/day for 68.0 years (34.0 ttl pk-yrs)    Types: Cigarettes    Start date: 06/11/1952    Quit date: 06/11/2020    Years since quitting: 3.4   Smokeless tobacco: Never  Substance and Sexual Activity   Alcohol use: Not Currently   Drug use: Not Currently   Sexual activity: Not on file  Other Topics Concern   Not on file  Social History Narrative   Not on file   Social Drivers of Health   Financial Resource Strain: Not on file  Food Insecurity: No Food Insecurity (10/29/2023)   Hunger Vital Sign    Worried About Running Out of Food in the Last Year: Never true  Ran Out of Food in the Last Year: Never true  Transportation Needs: No Transportation Needs (10/29/2023)   PRAPARE - Administrator, Civil Service (Medical): No    Lack of Transportation (Non-Medical): No  Physical Activity: Insufficiently Active (12/25/2022)   Exercise Vital Sign    Days of Exercise per Week: 7 days    Minutes of Exercise per Session: 20 min  Stress: Stress Concern Present (12/25/2022)   [employer] of Occupational Health - Occupational Stress Questionnaire    Feeling of Stress : Very much  Social Connections: Socially Isolated (12/25/2022)   Social Connection and Isolation Panel [NHANES]    Frequency of Communication with Friends and Family: Never     Frequency of Social Gatherings with Friends and Family: Never    Attends Religious Services: Never    Database administrator or Organizations: No    Attends Banker Meetings: Never    Marital Status: Never married  Intimate Partner Violence: Not At Risk (10/29/2023)   Humiliation, Afraid, Rape, and Kick questionnaire    Fear of Current or Ex-Partner: No    Emotionally Abused: No    Physically Abused: No    Sexually Abused: No    Medications: Outpatient Medications Prior to Visit  Medication Sig Note   aspirin EC 81 MG tablet Take 1 tablet (81 mg total) by mouth daily. Swallow whole.    clopidogrel (PLAVIX) 75 MG tablet TAKE 1 TABLET BY MOUTH ONCE DAILY    erythromycin ophthalmic ointment Place 3.5 g into both eyes as needed.    feeding supplement (ENSURE ENLIVE / ENSURE PLUS) LIQD Take 237 mLs by mouth 2 (two) times daily between meals. 10/22/2023: Patient having problems with this. Makes her sick.   Ferrous Sulfate (IRON) 325 (65 Fe) MG TABS TAKE 1 TABLET BY MOUTH ONCE DAILY    gabapentin (NEURONTIN) 400 MG capsule TAKE 1 CAPSULE BY MOUTH FOUR TIMES DAILY    meloxicam (MOBIC) 15 MG tablet TAKE 1 TABLET BY MOUTH ONCE DAILY    metoprolol succinate (TOPROL-XL) 25 MG 24 hr tablet TAKE 1 TABLET BY MOUTH ONCE DAILY    oxyCODONE-acetaminophen (PERCOCET) 10-325 MG tablet Take 1 tablet by mouth every 6 (six) hours as needed.    terbinafine (LAMISIL) 250 MG tablet Take 250 mg by mouth daily.    [DISCONTINUED] sacubitril-valsartan (ENTRESTO) 24-26 MG Take 1 tablet by mouth 2 (two) times daily.    No facility-administered medications prior to visit.    Allergies  Allergen Reactions   Spironolactone Anaphylaxis   Fluogen [Influenza Virus Vaccine] Hives   Statins     Dizziness & weakness   Sulfa Antibiotics Other (See Comments)    Unknown, happened as a child    Patient Care Team: Miki Kins, FNP as PCP - General (Family Medicine) Iran Ouch, MD as PCP -  Cardiology (Cardiology)  Review of Systems  Constitutional:  Positive for fatigue.  Psychiatric/Behavioral:  Positive for dysphoric mood. The patient is nervous/anxious.   All other systems reviewed and are negative.     Objective:    Vitals: BP 130/80   Pulse 81   Ht 5' 6.5" (1.689 m)   Wt 105 lb (47.6 kg)   SpO2 93%   BMI 16.69 kg/m    Physical Exam Vitals and nursing note reviewed.  Constitutional:      Appearance: Normal appearance. She is normal weight.  HENT:     Head: Normocephalic.  Eyes:  Extraocular Movements: Extraocular movements intact.     Conjunctiva/sclera: Conjunctivae normal.     Pupils: Pupils are equal, round, and reactive to light.  Cardiovascular:     Rate and Rhythm: Normal rate.  Pulmonary:     Effort: Pulmonary effort is normal.  Musculoskeletal:     Cervical back: Normal range of motion.  Skin:    Comments: Wounds on lower extremities in various stages of healing.  Have had delayed healing due to poor circulation  Neurological:     General: No focal deficit present.     Mental Status: She is alert and oriented to person, place, and time. Mental status is at baseline.     Gait: Gait abnormal (shuffling).  Psychiatric:        Mood and Affect: Mood normal.        Behavior: Behavior normal.        Thought Content: Thought content normal.        Judgment: Judgment normal.      Most recent functional status assessment:    10/22/2023    9:28 AM  In your present state of health, do you have any difficulty performing the following activities:  Hearing? 1  Vision? 0  Difficulty concentrating or making decisions? 0  Walking or climbing stairs? 1  Dressing or bathing? 0  Doing errands, shopping? 0  Preparing Food and eating ? N  Using the Toilet? N  In the past six months, have you accidently leaked urine? Y  Do you have problems with loss of bowel control? N  Managing your Medications? N  Managing your Finances? N  Housekeeping or  managing your Housekeeping? Y    Most recent fall risk assessment:    11/08/2023    8:04 PM  Fall Risk   Falls in the past year? 1  Number falls in past yr: 1  Injury with Fall? 1  Risk for fall due to : History of fall(s);Impaired balance/gait;Impaired mobility;Orthopedic patient  Follow up Falls evaluation completed;Education provided;Falls prevention discussed;Follow up appointment     Most recent depression screenings:    10/29/2023   11:54 AM 12/25/2022   10:37 AM  PHQ 2/9 Scores  PHQ - 2 Score 0 6  PHQ- 9 Score  16    Most recent cognitive screening:    11/08/2023    7:59 PM  6CIT Screen  What Year? 0 points  What month? 0 points  What time? 0 points  Count back from 20 0 points  Months in reverse 0 points  Repeat phrase 2 points  Total Score 2 points    No results found for any visits on 10/22/23.     Assessment & Plan:      Annual wellness visit done today including the all of the following: Reviewed patient's Family Medical History Reviewed and updated list of patient's medical providers Assessment of cognitive impairment was done Assessed patient's functional ability Established a written schedule for health screening services Health Risk Assessent Completed and Reviewed  Exercise Activities and Dietary recommendations  Goals      Absence of Fall and Fall-Related Injury     Evidence-based guidance:  Assess fall risk using a validated tool when available. Consider balance and gait impairment, muscle weakness, diminished vision or hearing, environmental hazards, presence of urinary or bowel urgency and/or incontinence.  Communicate fall injury risk to interprofessional healthcare team.  Develop a fall prevention plan with the patient and family.  Promote use of personal vision and auditory  aids.  Promote reorientation, appropriate sensory stimulation, and routines to decrease risk of fall when changes in mental status are present.  Assess  assistance level required for safe and effective self-care; consider referral for home care.  Encourage physical activity, such as performance of self-care at highest level of ability, strength and balance exercise program, and provision of appropriate assistive devices; refer to rehabilitation therapy.  Refer to community-based fall prevention program where available.  If fall occurs, determine the cause and revise fall injury prevention plan.  Regularly review medication contribution to fall risk; consider risk related to polypharmacy and age.  Refer to pharmacist for consultation when concerns about medications are revealed.  Balance adequate pain management with potential for oversedation.  Provide guidance related to environmental modifications.  Consider supplementation with Vitamin D.   Notes:      Hypertension Monitored     Evidence-based guidance:  Promote initial use of ambulatory blood pressure measurements (for 3 days) to rule out "white-coat" effect; identify masked hypertension and presence or absence of nocturnal "dipping" of blood pressure.   Encourage continued use of home blood pressure monitoring and recording in blood pressure log; include symptoms of hypotension or potential medication side effects in log.  Review blood pressure measurements taken inside and outside of the provider office; establish baseline and monitor trends; compare to target ranges or patient goal.  Share overall cardiovascular risk with patient; encourage changes to lifestyle risk factors, including alcohol consumption, smoking, inadequate exercise, poor dietary habits and stress.   Notes:      Tobacco Use Managed     Evidence-based guidance:  Identify tobacco use status including cigarette, cigar, roll-your-own, dissolvable, hookah or smokeless tobacco, nicotine gels, vapes, e-cigarettes or other electronic delivery systems.  Identify the patient's willingness to quit or cut down.   Intervene using  the ?o5A's? technique:  Ask: Identify and document tobacco use status for every patient at every visit.  Assess willingness to make a quit attempt now.  Advise: In a clear, strong and personalized manner, urge every tobacco user to quit.  Assist: For the patient willing to make a quit attempt, use counseling and pharmacologic therapy (nicotine replacement, antidepressant, nicotinic receptor partial agonist) to help quit.  Arrange: Schedule follow-up contact, in person or by telephone, preferably within the first week after the quit date.   Notes:         Immunization History  Administered Date(s) Administered   PFIZER Comirnaty(Gray Top)Covid-19 Tri-Sucrose Vaccine 09/19/2020   PFIZER(Purple Top)SARS-COV-2 Vaccination 01/14/2020, 02/03/2020    Health Maintenance  Topic Date Due   Pneumonia Vaccine 15+ Years old (1 of 2 - PCV) Never done   DTaP/Tdap/Td (1 - Tdap) Never done   Zoster Vaccines- Shingrix (1 of 2) Never done   DEXA SCAN  Never done   INFLUENZA VACCINE  Never done   COVID-19 Vaccine (4 - 2024-25 season) 07/13/2023   Medicare Annual Wellness (AWV)  10/21/2024   HPV VACCINES  Aged Out     Discussed health benefits of physical activity, and encouraged her to engage in regular exercise appropriate for her age and condition.        Miki Kins, FNP   10/22/2023  This document may have been prepared by Dragon Voice Recognition software and as such may include unintentional dictation errors.

## 2023-11-10 DIAGNOSIS — I87313 Chronic venous hypertension (idiopathic) with ulcer of bilateral lower extremity: Secondary | ICD-10-CM | POA: Diagnosis not present

## 2023-11-10 DIAGNOSIS — L89622 Pressure ulcer of left heel, stage 2: Secondary | ICD-10-CM | POA: Diagnosis not present

## 2023-11-10 DIAGNOSIS — L97519 Non-pressure chronic ulcer of other part of right foot with unspecified severity: Secondary | ICD-10-CM | POA: Diagnosis not present

## 2023-11-10 DIAGNOSIS — L97529 Non-pressure chronic ulcer of other part of left foot with unspecified severity: Secondary | ICD-10-CM | POA: Diagnosis not present

## 2023-11-10 DIAGNOSIS — L97819 Non-pressure chronic ulcer of other part of right lower leg with unspecified severity: Secondary | ICD-10-CM | POA: Diagnosis not present

## 2023-11-11 DIAGNOSIS — Z9181 History of falling: Secondary | ICD-10-CM | POA: Diagnosis not present

## 2023-11-11 DIAGNOSIS — E569 Vitamin deficiency, unspecified: Secondary | ICD-10-CM | POA: Diagnosis not present

## 2023-11-11 DIAGNOSIS — I872 Venous insufficiency (chronic) (peripheral): Secondary | ICD-10-CM | POA: Diagnosis not present

## 2023-11-11 DIAGNOSIS — L97812 Non-pressure chronic ulcer of other part of right lower leg with fat layer exposed: Secondary | ICD-10-CM | POA: Diagnosis not present

## 2023-11-11 DIAGNOSIS — I5022 Chronic systolic (congestive) heart failure: Secondary | ICD-10-CM | POA: Diagnosis not present

## 2023-11-11 DIAGNOSIS — D649 Anemia, unspecified: Secondary | ICD-10-CM | POA: Diagnosis not present

## 2023-11-11 DIAGNOSIS — Z7902 Long term (current) use of antithrombotics/antiplatelets: Secondary | ICD-10-CM | POA: Diagnosis not present

## 2023-11-11 DIAGNOSIS — Z791 Long term (current) use of non-steroidal anti-inflammatories (NSAID): Secondary | ICD-10-CM | POA: Diagnosis not present

## 2023-11-11 DIAGNOSIS — Z556 Problems related to health literacy: Secondary | ICD-10-CM | POA: Diagnosis not present

## 2023-11-11 DIAGNOSIS — I2510 Atherosclerotic heart disease of native coronary artery without angina pectoris: Secondary | ICD-10-CM | POA: Diagnosis not present

## 2023-11-11 DIAGNOSIS — Z7982 Long term (current) use of aspirin: Secondary | ICD-10-CM | POA: Diagnosis not present

## 2023-11-11 DIAGNOSIS — I739 Peripheral vascular disease, unspecified: Secondary | ICD-10-CM | POA: Diagnosis not present

## 2023-11-11 DIAGNOSIS — I4891 Unspecified atrial fibrillation: Secondary | ICD-10-CM | POA: Diagnosis not present

## 2023-11-11 DIAGNOSIS — I110 Hypertensive heart disease with heart failure: Secondary | ICD-10-CM | POA: Diagnosis not present

## 2023-11-11 DIAGNOSIS — L97422 Non-pressure chronic ulcer of left heel and midfoot with fat layer exposed: Secondary | ICD-10-CM | POA: Diagnosis not present

## 2023-11-13 ENCOUNTER — Telehealth (INDEPENDENT_AMBULATORY_CARE_PROVIDER_SITE_OTHER): Payer: Self-pay

## 2023-11-13 DIAGNOSIS — I2510 Atherosclerotic heart disease of native coronary artery without angina pectoris: Secondary | ICD-10-CM | POA: Diagnosis not present

## 2023-11-13 DIAGNOSIS — I872 Venous insufficiency (chronic) (peripheral): Secondary | ICD-10-CM | POA: Diagnosis not present

## 2023-11-13 DIAGNOSIS — E569 Vitamin deficiency, unspecified: Secondary | ICD-10-CM | POA: Diagnosis not present

## 2023-11-13 DIAGNOSIS — Z7982 Long term (current) use of aspirin: Secondary | ICD-10-CM | POA: Diagnosis not present

## 2023-11-13 DIAGNOSIS — I739 Peripheral vascular disease, unspecified: Secondary | ICD-10-CM | POA: Diagnosis not present

## 2023-11-13 DIAGNOSIS — Z7902 Long term (current) use of antithrombotics/antiplatelets: Secondary | ICD-10-CM | POA: Diagnosis not present

## 2023-11-13 DIAGNOSIS — D649 Anemia, unspecified: Secondary | ICD-10-CM | POA: Diagnosis not present

## 2023-11-13 DIAGNOSIS — L97812 Non-pressure chronic ulcer of other part of right lower leg with fat layer exposed: Secondary | ICD-10-CM | POA: Diagnosis not present

## 2023-11-13 DIAGNOSIS — Z791 Long term (current) use of non-steroidal anti-inflammatories (NSAID): Secondary | ICD-10-CM | POA: Diagnosis not present

## 2023-11-13 DIAGNOSIS — I4891 Unspecified atrial fibrillation: Secondary | ICD-10-CM | POA: Diagnosis not present

## 2023-11-13 DIAGNOSIS — Z9181 History of falling: Secondary | ICD-10-CM | POA: Diagnosis not present

## 2023-11-13 DIAGNOSIS — Z556 Problems related to health literacy: Secondary | ICD-10-CM | POA: Diagnosis not present

## 2023-11-13 DIAGNOSIS — L97422 Non-pressure chronic ulcer of left heel and midfoot with fat layer exposed: Secondary | ICD-10-CM | POA: Diagnosis not present

## 2023-11-13 DIAGNOSIS — I5022 Chronic systolic (congestive) heart failure: Secondary | ICD-10-CM | POA: Diagnosis not present

## 2023-11-13 DIAGNOSIS — I110 Hypertensive heart disease with heart failure: Secondary | ICD-10-CM | POA: Diagnosis not present

## 2023-11-13 NOTE — Telephone Encounter (Signed)
 [name] [name] a home health nurse with [facility] left a message requesting verbal orders for nursing twice a week for 8 weeks and 3 prn for wound complications. [name] [name] was notified that orders are fine.

## 2023-11-14 ENCOUNTER — Ambulatory Visit (INDEPENDENT_AMBULATORY_CARE_PROVIDER_SITE_OTHER): Payer: 59 | Admitting: Podiatry

## 2023-11-14 ENCOUNTER — Ambulatory Visit: Payer: 59 | Admitting: Podiatry

## 2023-11-14 ENCOUNTER — Encounter: Payer: Self-pay | Admitting: Podiatry

## 2023-11-14 DIAGNOSIS — M79675 Pain in left toe(s): Secondary | ICD-10-CM

## 2023-11-14 DIAGNOSIS — L97512 Non-pressure chronic ulcer of other part of right foot with fat layer exposed: Secondary | ICD-10-CM

## 2023-11-14 DIAGNOSIS — M79674 Pain in right toe(s): Secondary | ICD-10-CM | POA: Diagnosis not present

## 2023-11-14 DIAGNOSIS — I739 Peripheral vascular disease, unspecified: Secondary | ICD-10-CM

## 2023-11-14 DIAGNOSIS — B351 Tinea unguium: Secondary | ICD-10-CM | POA: Diagnosis not present

## 2023-11-14 NOTE — Progress Notes (Signed)
 Chief Complaint  Patient presents with   Wound Check    She has a wound in between her toes on the right foot. N - wound L - interdigital 1st and 2nd right D - 2 weeks O - suddenly C - ulcer A - none T - bandage    SUBJECTIVE Patient presents to office today complaining of elongated, thickened nails that cause pain while ambulating in shoes.  Patient is unable to trim their own nails.  Patient also has chronic wounds to the bilateral ankles.  This has been treated and managed by [facility] for several years.  Patient is here for further evaluation and treatment.  Past Medical History:  Diagnosis Date   AKI (acute kidney injury) (HCC) 02/20/2023   Atrial fibrillation (HCC)    Basal cell carcinoma 2021   L temple   Chronic back pain    Congestive heart failure (CHF) (HCC)    History of total hip replacement (Bilateral) 01/12/2020   Left hip replacement done in 2019.  Right hip replacement done in 2012.   Hypertension    Hyponatremia 06/11/2020   Melena 02/21/2023   NSTEMI (non-ST elevated myocardial infarction) (HCC) 06/11/2020   Pharmacologic therapy 01/12/2020   Problems influencing health status 01/12/2020   Scoliosis    Upper GI bleeding 02/21/2023    Allergies  Allergen Reactions   Spironolactone Anaphylaxis   Fluogen [Influenza Virus Vaccine] Hives   Statins     Dizziness & weakness   Sulfa Antibiotics Other (See Comments)    Unknown, happened as a child     OBJECTIVE General Patient is awake, alert, and oriented x 3 and in no acute distress. Derm Skin is dry and supple bilateral. Negative open lesions or macerations. Remaining integument unremarkable. Nails are tender, long, thickened and dystrophic with subungual debris, consistent with onychomycosis, 1-5 bilateral. No signs of infection noted.  Superficial stable ulcer noted extending just past the dermal layers to the interdigital areas of the right foot between the great toe and the adjacent  digit. Vasc history of peripheral vascular disease.  Currently being managed by [facility] Neuro light touch and protective threshold sensation grossly intact bilaterally.  Musculoskeletal Exam No symptomatic pedal deformities noted bilateral. Muscular strength within normal limits.  ASSESSMENT 1.  Pain due to onychomycosis of toenails both 2.  Symptomatic calluses tips of the toes bilateral 3.  Interdigital ulcer between the great toe and second digit right foot  PLAN OF CARE 1. Patient evaluated today.  2. Instructed to maintain good pedal hygiene and foot care.  3. Mechanical debridement of nails 1-5 bilaterally performed using a nail nipper. Filed with dremel without incident.  4.  Medically necessary excisional debridement including subcutaneous tissue was performed today using a tissue nipper.  Excisional debridement of the necrotic nonviable tissue down to healthier tissue was performed with postdebridement measurement same as pre- 5.  Recommend wide fitting shoes that do not constrict the toebox area  6.  Recommend triple antibiotic ointment  7.  Continue wound care with [facility]  8.  Continue active management with [facility]  9.  Return to clinic routine footcare   Thresa EMERSON Sar, DPM [facility]  Dr. Thresa EMERSON Sar, DPM    [address]  [place], [place] [address]                Office [phone]  Fax [phone]

## 2023-11-17 ENCOUNTER — Other Ambulatory Visit: Payer: Self-pay | Admitting: Family

## 2023-11-17 ENCOUNTER — Ambulatory Visit
Admission: RE | Admit: 2023-11-17 | Discharge: 2023-11-17 | Disposition: A | Discharge status: Home / Self Care | Payer: 59 | Source: Ambulatory Visit | Attending: Radiation Oncology | Admitting: Radiation Oncology

## 2023-11-17 ENCOUNTER — Encounter: Payer: Self-pay | Admitting: Radiation Oncology

## 2023-11-17 VITALS — BP 149/78 | HR 87 | Temp 97.0°F | Resp 16 | Wt 103.0 lb

## 2023-11-17 DIAGNOSIS — C44319 Basal cell carcinoma of skin of other parts of face: Secondary | ICD-10-CM | POA: Diagnosis not present

## 2023-11-17 DIAGNOSIS — C44310 Basal cell carcinoma of skin of unspecified parts of face: Secondary | ICD-10-CM | POA: Insufficient documentation

## 2023-11-17 DIAGNOSIS — Z510 Encounter for antineoplastic radiation therapy: Secondary | ICD-10-CM | POA: Diagnosis not present

## 2023-11-17 MED ORDER — OXYCODONE-ACETAMINOPHEN 10-325 MG PO TABS: 1.0000 | ORAL_TABLET | Freq: Four times a day (QID) | ORAL | 0 refills | Status: DC | PRN

## 2023-11-17 NOTE — Consult Note (Signed)
 NEW PATIENT EVALUATION  Name: [name] [name]  MRN: [id number]  Date:   11/17/2023     DOB: [birth year]   This 89 y.o. female patient presents to the clinic for initial evaluation of basal cell carcinoma of her right cheek as well as right bridge of nose.  REFERRING PHYSICIAN: Orlean Alan HERO, FNP  CHIEF COMPLAINT:  Chief Complaint  Patient presents with   [facility] of face    DIAGNOSIS: The encounter diagnosis was Basal cell carcinoma (BCC) of skin of face, unspecified part of face.   PREVIOUS INVESTIGATIONS:  Pathology reports reviewed Clinical notes reviewed  HPI: Patient is an 89 year old female diagnosed with a basal cell carcinoma of the right bridge of her nose as well as right face.  She has been seen by medical oncology now referred to radiation oncology for consideration of electron-beam therapy.  Based on her age and comorbidities she is a nonsurgical candidate.  She is basically asymptomatic.  PLANNED TREATMENT REGIMEN: Electron-beam therapy  PAST MEDICAL HISTORY:  has a past medical history of AKI (acute kidney injury) (HCC) (02/20/2023), Atrial fibrillation (HCC), Basal cell carcinoma (2021), Chronic back pain, Congestive heart failure (CHF) (HCC), History of total hip replacement (Bilateral) (01/12/2020), Hypertension, Hyponatremia (06/11/2020), Melena (02/21/2023), NSTEMI (non-ST elevated myocardial infarction) (HCC) (06/11/2020), Pharmacologic therapy (01/12/2020), Problems influencing health status (01/12/2020), Scoliosis, and Upper GI bleeding (02/21/2023).    PAST SURGICAL HISTORY:  Past Surgical History:  Procedure Laterality Date   ABDOMINAL SURGERY     bilateral hip replacement     CORONARY STENT INTERVENTION N/A 06/12/2020   Procedure: CORONARY STENT INTERVENTION;  Surgeon: Darron Deatrice LABOR, MD;  Location: [facility] INVASIVE [facility];  Service: Cardiovascular;  Laterality: N/A;  LAD   ESOPHAGOGASTRODUODENOSCOPY (EGD) WITH PROPOFOL  N/A 02/24/2023   Procedure:  ESOPHAGOGASTRODUODENOSCOPY (EGD) WITH PROPOFOL ;  Surgeon: Jinny Carmine, MD;  Location: [facility] ENDOSCOPY;  Service: Endoscopy;  Laterality: N/A;   LEFT HEART CATH AND CORONARY ANGIOGRAPHY N/A 06/12/2020   Procedure: LEFT HEART CATH AND CORONARY ANGIOGRAPHY;  Surgeon: Darron Deatrice LABOR, MD;  Location: [facility] INVASIVE CV LAB;  Service: Cardiovascular;  Laterality: N/A;   LOWER EXTREMITY ANGIOGRAPHY Right 02/25/2023   Procedure: Lower Extremity Angiography;  Surgeon: Jama Cordella MATSU, MD;  Location: [facility] INVASIVE CV LAB;  Service: Cardiovascular;  Laterality: Right;   TONSILLECTOMY      FAMILY HISTORY: family history includes Arthritis/Rheumatoid in her sister; Breast cancer in her mother and paternal grandmother; CVA in her father; Heart disease in her father.  SOCIAL HISTORY:  reports that she quit smoking about 3 years ago. Her smoking use included cigarettes. She started smoking about 71 years ago. She has a 34 pack-year smoking history. She has never used smokeless tobacco. She reports that she does not currently use alcohol. She reports that she does not currently use drugs.  ALLERGIES: Spironolactone, Fluogen [influenza virus vaccine], Statins, and Sulfa antibiotics  MEDICATIONS:  Current Outpatient Medications  Medication Sig Dispense Refill   aspirin  EC 81 MG tablet Take 1 tablet (81 mg total) by mouth daily. Swallow whole. 30 tablet 0   clopidogrel  (PLAVIX ) 75 MG tablet TAKE 1 TABLET BY MOUTH ONCE DAILY 30 tablet 8   erythromycin ophthalmic ointment Place 3.5 g into both eyes as needed.     feeding supplement (ENSURE ENLIVE / ENSURE PLUS) LIQD Take 237 mLs by mouth 2 (two) times daily between meals. 237 mL 12   Ferrous Sulfate  (IRON ) 325 (65 Fe) MG TABS TAKE 1 TABLET BY MOUTH ONCE DAILY 30  tablet 8   gabapentin  (NEURONTIN ) 400 MG capsule TAKE 1 CAPSULE BY MOUTH FOUR TIMES DAILY 120 capsule 8   lisinopril  (ZESTRIL ) 5 MG tablet Take 5 mg by mouth daily.     meloxicam (MOBIC) 15 MG tablet TAKE  1 TABLET BY MOUTH ONCE DAILY 30 tablet 8   metoprolol  succinate (TOPROL -XL) 25 MG 24 hr tablet TAKE 1 TABLET BY MOUTH ONCE DAILY 30 tablet 8   oxyCODONE -acetaminophen  (PERCOCET) 10-325 MG tablet Take 1 tablet by mouth every 6 (six) hours as needed. 120 tablet 0   terbinafine  (LAMISIL ) 250 MG tablet Take 250 mg by mouth daily.     valsartan  (DIOVAN ) 40 MG tablet Take 1 tablet (40 mg total) by mouth 2 (two) times daily. 60 tablet 1   No current facility-administered medications for this encounter.    ECOG PERFORMANCE STATUS:  0 - Asymptomatic  REVIEW OF SYSTEMS: Patient denies any weight loss, fatigue, weakness, fever, chills or night sweats. Patient denies any loss of vision, blurred vision. Patient denies any ringing  of the ears or hearing loss. No irregular heartbeat. Patient denies heart murmur or history of fainting. Patient denies any chest pain or pain radiating to her upper extremities. Patient denies any shortness of breath, difficulty breathing at night, cough or hemoptysis. Patient denies any swelling in the lower legs. Patient denies any nausea vomiting, vomiting of blood, or coffee ground material in the vomitus. Patient denies any stomach pain. Patient states has had normal bowel movements no significant constipation or diarrhea. Patient denies any dysuria, hematuria or significant nocturia. Patient denies any problems walking, swelling in the joints or loss of balance. Patient denies any skin changes, loss of hair or loss of weight. Patient denies any excessive worrying or anxiety or significant depression. Patient denies any problems with insomnia. Patient denies excessive thirst, polyuria, polydipsia. Patient denies any swollen glands, patient denies easy bruising or easy bleeding. Patient denies any recent infections, allergies or URI. Patient s visual fields have not changed significantly in recent time.   PHYSICAL EXAM: BP (!) 149/78 Comment: recheck  Pulse 87   Temp (!) 97 F  (36.1 C) (Tympanic)   Resp 16   Wt 103 lb (46.7 kg)   BMI 16.38 kg/m  Slightly ulcerated erythematous nodule in the right nose bridge.  Also area of skin thickening on the right facial temporal region consistent with both areas of known basal cell carcinoma.  Well-developed well-nourished patient in NAD. HEENT reveals PERLA, EOMI, discs not visualized.  Oral cavity is clear. No oral mucosal lesions are identified. Neck is clear without evidence of cervical or supraclavicular adenopathy. Lungs are clear to A&P. Cardiac examination is essentially unremarkable with regular rate and rhythm without murmur rub or thrill. Abdomen is benign with no organomegaly or masses noted. Motor sensory and DTR levels are equal and symmetric in the upper and lower extremities. Cranial nerves II through XII are grossly intact. Proprioception is intact. No peripheral adenopathy or edema is identified. No motor or sensory levels are noted. Crude visual fields are within normal range.  LABORATORY DATA: Labs reviewed    RADIOLOGY RESULTS: No current films for review   IMPRESSION: 2 areas of basal cell carcinoma one of the right temple facial region and the other right nose bridge in 89 year old female  PLAN: At this time I have recommended radiation therapy using electron beam to both sites at the same time.  Would plan on delivering 44 Gray in 20 fractions.  The lesion on  the nose bridge is close to the eye although we will avoid that during our treatment.  Risks and benefits of treatment including skin reaction fatigue alteration of blood counts all were discussed in detail with the patient.  I have personally set up and ordered CT simulation for next week.  I would like to take this opportunity to thank you for allowing me to participate in the care of your patient.SABRA Marcey Penton, MD

## 2023-11-18 ENCOUNTER — Other Ambulatory Visit: Payer: Self-pay | Admitting: Radiation Oncology

## 2023-11-18 DIAGNOSIS — E569 Vitamin deficiency, unspecified: Secondary | ICD-10-CM | POA: Diagnosis not present

## 2023-11-18 DIAGNOSIS — I5022 Chronic systolic (congestive) heart failure: Secondary | ICD-10-CM | POA: Diagnosis not present

## 2023-11-18 DIAGNOSIS — Z7902 Long term (current) use of antithrombotics/antiplatelets: Secondary | ICD-10-CM | POA: Diagnosis not present

## 2023-11-18 DIAGNOSIS — Z556 Problems related to health literacy: Secondary | ICD-10-CM | POA: Diagnosis not present

## 2023-11-18 DIAGNOSIS — L97812 Non-pressure chronic ulcer of other part of right lower leg with fat layer exposed: Secondary | ICD-10-CM | POA: Diagnosis not present

## 2023-11-18 DIAGNOSIS — L97422 Non-pressure chronic ulcer of left heel and midfoot with fat layer exposed: Secondary | ICD-10-CM | POA: Diagnosis not present

## 2023-11-18 DIAGNOSIS — I4891 Unspecified atrial fibrillation: Secondary | ICD-10-CM | POA: Diagnosis not present

## 2023-11-18 DIAGNOSIS — D649 Anemia, unspecified: Secondary | ICD-10-CM | POA: Diagnosis not present

## 2023-11-18 DIAGNOSIS — I110 Hypertensive heart disease with heart failure: Secondary | ICD-10-CM | POA: Diagnosis not present

## 2023-11-18 DIAGNOSIS — I2510 Atherosclerotic heart disease of native coronary artery without angina pectoris: Secondary | ICD-10-CM | POA: Diagnosis not present

## 2023-11-18 DIAGNOSIS — Z9181 History of falling: Secondary | ICD-10-CM | POA: Diagnosis not present

## 2023-11-18 DIAGNOSIS — C801 Malignant (primary) neoplasm, unspecified: Secondary | ICD-10-CM

## 2023-11-18 DIAGNOSIS — Z791 Long term (current) use of non-steroidal anti-inflammatories (NSAID): Secondary | ICD-10-CM | POA: Diagnosis not present

## 2023-11-18 DIAGNOSIS — I739 Peripheral vascular disease, unspecified: Secondary | ICD-10-CM | POA: Diagnosis not present

## 2023-11-18 DIAGNOSIS — Z7982 Long term (current) use of aspirin: Secondary | ICD-10-CM | POA: Diagnosis not present

## 2023-11-18 DIAGNOSIS — I872 Venous insufficiency (chronic) (peripheral): Secondary | ICD-10-CM | POA: Diagnosis not present

## 2023-11-19 DIAGNOSIS — C44319 Basal cell carcinoma of skin of other parts of face: Secondary | ICD-10-CM | POA: Diagnosis not present

## 2023-11-20 DIAGNOSIS — H903 Sensorineural hearing loss, bilateral: Secondary | ICD-10-CM | POA: Diagnosis not present

## 2023-11-20 DIAGNOSIS — H6123 Impacted cerumen, bilateral: Secondary | ICD-10-CM | POA: Diagnosis not present

## 2023-11-21 ENCOUNTER — Ambulatory Visit: Admission: RE | Admit: 2023-11-21 | Payer: 59 | Source: Ambulatory Visit

## 2023-11-21 DIAGNOSIS — I739 Peripheral vascular disease, unspecified: Secondary | ICD-10-CM | POA: Diagnosis not present

## 2023-11-21 DIAGNOSIS — Z7982 Long term (current) use of aspirin: Secondary | ICD-10-CM | POA: Diagnosis not present

## 2023-11-21 DIAGNOSIS — I5022 Chronic systolic (congestive) heart failure: Secondary | ICD-10-CM | POA: Diagnosis not present

## 2023-11-21 DIAGNOSIS — L97422 Non-pressure chronic ulcer of left heel and midfoot with fat layer exposed: Secondary | ICD-10-CM | POA: Diagnosis not present

## 2023-11-21 DIAGNOSIS — Z556 Problems related to health literacy: Secondary | ICD-10-CM | POA: Diagnosis not present

## 2023-11-21 DIAGNOSIS — I872 Venous insufficiency (chronic) (peripheral): Secondary | ICD-10-CM | POA: Diagnosis not present

## 2023-11-21 DIAGNOSIS — Z7902 Long term (current) use of antithrombotics/antiplatelets: Secondary | ICD-10-CM | POA: Diagnosis not present

## 2023-11-21 DIAGNOSIS — I110 Hypertensive heart disease with heart failure: Secondary | ICD-10-CM | POA: Diagnosis not present

## 2023-11-21 DIAGNOSIS — I2510 Atherosclerotic heart disease of native coronary artery without angina pectoris: Secondary | ICD-10-CM | POA: Diagnosis not present

## 2023-11-21 DIAGNOSIS — Z9181 History of falling: Secondary | ICD-10-CM | POA: Diagnosis not present

## 2023-11-21 DIAGNOSIS — L97812 Non-pressure chronic ulcer of other part of right lower leg with fat layer exposed: Secondary | ICD-10-CM | POA: Diagnosis not present

## 2023-11-21 DIAGNOSIS — Q7271 Split foot, right lower limb: Secondary | ICD-10-CM | POA: Diagnosis not present

## 2023-11-21 DIAGNOSIS — I4891 Unspecified atrial fibrillation: Secondary | ICD-10-CM | POA: Diagnosis not present

## 2023-11-21 DIAGNOSIS — D649 Anemia, unspecified: Secondary | ICD-10-CM | POA: Diagnosis not present

## 2023-11-21 DIAGNOSIS — E569 Vitamin deficiency, unspecified: Secondary | ICD-10-CM | POA: Diagnosis not present

## 2023-11-21 DIAGNOSIS — Z791 Long term (current) use of non-steroidal anti-inflammatories (NSAID): Secondary | ICD-10-CM | POA: Diagnosis not present

## 2023-11-25 ENCOUNTER — Ambulatory Visit
Admission: RE | Admit: 2023-11-25 | Discharge: 2023-11-25 | Disposition: A | Discharge status: Home / Self Care | Payer: 59 | Source: Ambulatory Visit | Attending: Radiation Oncology | Admitting: Radiation Oncology

## 2023-11-25 DIAGNOSIS — Z7982 Long term (current) use of aspirin: Secondary | ICD-10-CM | POA: Diagnosis not present

## 2023-11-25 DIAGNOSIS — L97422 Non-pressure chronic ulcer of left heel and midfoot with fat layer exposed: Secondary | ICD-10-CM | POA: Diagnosis not present

## 2023-11-25 DIAGNOSIS — Z791 Long term (current) use of non-steroidal anti-inflammatories (NSAID): Secondary | ICD-10-CM | POA: Diagnosis not present

## 2023-11-25 DIAGNOSIS — C801 Malignant (primary) neoplasm, unspecified: Secondary | ICD-10-CM

## 2023-11-25 DIAGNOSIS — I739 Peripheral vascular disease, unspecified: Secondary | ICD-10-CM | POA: Diagnosis not present

## 2023-11-25 DIAGNOSIS — Z510 Encounter for antineoplastic radiation therapy: Secondary | ICD-10-CM | POA: Diagnosis not present

## 2023-11-25 DIAGNOSIS — L97812 Non-pressure chronic ulcer of other part of right lower leg with fat layer exposed: Secondary | ICD-10-CM | POA: Diagnosis not present

## 2023-11-25 DIAGNOSIS — Z9181 History of falling: Secondary | ICD-10-CM | POA: Diagnosis not present

## 2023-11-25 DIAGNOSIS — E569 Vitamin deficiency, unspecified: Secondary | ICD-10-CM | POA: Diagnosis not present

## 2023-11-25 DIAGNOSIS — Z556 Problems related to health literacy: Secondary | ICD-10-CM | POA: Diagnosis not present

## 2023-11-25 DIAGNOSIS — Z7902 Long term (current) use of antithrombotics/antiplatelets: Secondary | ICD-10-CM | POA: Diagnosis not present

## 2023-11-25 DIAGNOSIS — D649 Anemia, unspecified: Secondary | ICD-10-CM | POA: Diagnosis not present

## 2023-11-25 DIAGNOSIS — Q7271 Split foot, right lower limb: Secondary | ICD-10-CM | POA: Diagnosis not present

## 2023-11-25 DIAGNOSIS — I4891 Unspecified atrial fibrillation: Secondary | ICD-10-CM | POA: Diagnosis not present

## 2023-11-25 DIAGNOSIS — I110 Hypertensive heart disease with heart failure: Secondary | ICD-10-CM | POA: Diagnosis not present

## 2023-11-25 DIAGNOSIS — C44310 Basal cell carcinoma of skin of unspecified parts of face: Secondary | ICD-10-CM | POA: Diagnosis not present

## 2023-11-25 DIAGNOSIS — I872 Venous insufficiency (chronic) (peripheral): Secondary | ICD-10-CM | POA: Diagnosis not present

## 2023-11-25 DIAGNOSIS — I5022 Chronic systolic (congestive) heart failure: Secondary | ICD-10-CM | POA: Diagnosis not present

## 2023-11-25 DIAGNOSIS — C44319 Basal cell carcinoma of skin of other parts of face: Secondary | ICD-10-CM | POA: Diagnosis not present

## 2023-11-25 DIAGNOSIS — I2510 Atherosclerotic heart disease of native coronary artery without angina pectoris: Secondary | ICD-10-CM | POA: Diagnosis not present

## 2023-11-26 ENCOUNTER — Ambulatory Visit (INDEPENDENT_AMBULATORY_CARE_PROVIDER_SITE_OTHER): Payer: 59 | Admitting: Family

## 2023-11-26 ENCOUNTER — Encounter: Payer: Self-pay | Admitting: Family

## 2023-11-26 VITALS — BP 180/90 | HR 64 | Ht 66.0 in | Wt 105.4 lb

## 2023-11-26 DIAGNOSIS — M79604 Pain in right leg: Secondary | ICD-10-CM

## 2023-11-26 DIAGNOSIS — S81801D Unspecified open wound, right lower leg, subsequent encounter: Secondary | ICD-10-CM

## 2023-11-26 DIAGNOSIS — S81802D Unspecified open wound, left lower leg, subsequent encounter: Secondary | ICD-10-CM

## 2023-11-26 DIAGNOSIS — M51372 Other intervertebral disc degeneration, lumbosacral region with discogenic back pain and lower extremity pain: Secondary | ICD-10-CM

## 2023-11-26 DIAGNOSIS — I10 Essential (primary) hypertension: Secondary | ICD-10-CM | POA: Diagnosis not present

## 2023-11-26 DIAGNOSIS — M79605 Pain in left leg: Secondary | ICD-10-CM

## 2023-11-26 DIAGNOSIS — G990 Autonomic neuropathy in diseases classified elsewhere: Secondary | ICD-10-CM | POA: Diagnosis not present

## 2023-11-26 DIAGNOSIS — G8929 Other chronic pain: Secondary | ICD-10-CM | POA: Diagnosis not present

## 2023-11-26 DIAGNOSIS — R636 Underweight: Secondary | ICD-10-CM | POA: Diagnosis not present

## 2023-11-26 DIAGNOSIS — M4120 Other idiopathic scoliosis, site unspecified: Secondary | ICD-10-CM

## 2023-11-26 NOTE — Progress Notes (Signed)
 Established Patient Office Visit  Subjective:  Patient ID: [name] [name], female    DOB: 07-02-1935  Age: 89 y.o. MRN: [id number]  Chief Complaint  Patient presents with   Follow-up    Patient is here today for her 1 month follow up.  She has been feeling poorly since last appointment.   She does have additional concerns to discuss today.  Patient is s/p treatments for basal cell carcinoma on face.   She is having severe weakness and fatigue.   Labs are not due today. She needs refills.   I have reviewed her active problem list, medication list, allergies, notes from last encounter, lab results for her appointment today.     No other concerns at this time.   Past Medical History:  Diagnosis Date   AKI (acute kidney injury) (HCC) 02/20/2023   Atrial fibrillation (HCC)    Basal cell carcinoma 2021   L temple   Chronic back pain    Congestive heart failure (CHF) (HCC)    History of total hip replacement (Bilateral) 01/12/2020   Left hip replacement done in 2019.  Right hip replacement done in 2012.   Hypertension    Hyponatremia 06/11/2020   Melena 02/21/2023   NSTEMI (non-ST elevated myocardial infarction) (HCC) 06/11/2020   Pharmacologic therapy 01/12/2020   Problems influencing health status 01/12/2020   Scoliosis    Upper GI bleeding 02/21/2023    Past Surgical History:  Procedure Laterality Date   ABDOMINAL SURGERY     bilateral hip replacement     CORONARY STENT INTERVENTION N/A 06/12/2020   Procedure: CORONARY STENT INTERVENTION;  Surgeon: Iran Ouch, MD;  Location: [facility];  Service: Cardiovascular;  Laterality: N/A;  LAD   ESOPHAGOGASTRODUODENOSCOPY (EGD) WITH PROPOFOL N/A 02/24/2023   Procedure: ESOPHAGOGASTRODUODENOSCOPY (EGD) WITH PROPOFOL;  Surgeon: Midge Minium, MD;  Location: [facility];  Service: Endoscopy;  Laterality: N/A;   LEFT HEART CATH AND CORONARY ANGIOGRAPHY N/A 06/12/2020   Procedure: LEFT HEART CATH AND CORONARY  ANGIOGRAPHY;  Surgeon: Iran Ouch, MD;  Location: [facility];  Service: Cardiovascular;  Laterality: N/A;   LOWER EXTREMITY ANGIOGRAPHY Right 02/25/2023   Procedure: Lower Extremity Angiography;  Surgeon: Renford Dills, MD;  Location: [facility];  Service: Cardiovascular;  Laterality: Right;   TONSILLECTOMY      Social History   Socioeconomic History   Marital status: Single    Spouse name: Not on file   Number of children: 0   Years of education: Not on file   Highest education level: Not on file  Occupational History   Not on file  Tobacco Use   Smoking status: Former    Current packs/day: 0.00    Average packs/day: 0.5 packs/day for 68.0 years (34.0 ttl pk-yrs)    Types: Cigarettes    Start date: 06/11/1952    Quit date: 06/11/2020    Years since quitting: 3.6   Smokeless tobacco: Never  Substance and Sexual Activity   Alcohol use: Not Currently   Drug use: Not Currently   Sexual activity: Not on file  Other Topics Concern   Not on file  Social History Narrative   Not on file   Social Drivers of Health   Financial Resource Strain: Not on file  Food Insecurity: No Food Insecurity (10/29/2023)   Hunger Vital Sign    Worried About Running Out of Food in the Last Year: Never true    Ran Out of Food in  the Last Year: Never true  Transportation Needs: No Transportation Needs (10/29/2023)   PRAPARE - Administrator, Civil Service (Medical): No    Lack of Transportation (Non-Medical): No  Physical Activity: Insufficiently Active (12/25/2022)   Exercise Vital Sign    Days of Exercise per Week: 7 days    Minutes of Exercise per Session: 20 min  Stress: Stress Concern Present (12/25/2022)   [employer] of Occupational Health - Occupational Stress Questionnaire    Feeling of Stress : Very much  Social Connections: Socially Isolated (12/25/2022)   Social Connection and Isolation Panel [NHANES]    Frequency of Communication with  Friends and Family: Never    Frequency of Social Gatherings with Friends and Family: Never    Attends Religious Services: Never    Database administrator or Organizations: No    Attends Banker Meetings: Never    Marital Status: Never married  Intimate Partner Violence: Not At Risk (10/29/2023)   Humiliation, Afraid, Rape, and Kick questionnaire    Fear of Current or Ex-Partner: No    Emotionally Abused: No    Physically Abused: No    Sexually Abused: No    Family History  Problem Relation Age of Onset   Breast cancer Mother    CVA Father    Heart disease Father    Arthritis/Rheumatoid Sister    Breast cancer Paternal Grandmother     Allergies  Allergen Reactions   Spironolactone Anaphylaxis   Fluogen [Influenza Virus Vaccine] Hives   Statins     Dizziness & weakness   Sulfa Antibiotics Other (See Comments)    Unknown, happened as a child    Review of Systems  Constitutional:  Positive for malaise/fatigue and weight loss.  Musculoskeletal:  Positive for back pain, falls and joint pain.  Neurological:  Positive for weakness.  All other systems reviewed and are negative.      Objective:   BP (!) 180/90   Pulse 64   Ht 5\' 6"  (1.676 m)   Wt 105 lb 6.4 oz (47.8 kg)   SpO2 98%   BMI 17.01 kg/m   Vitals:   11/26/23 0938  BP: (!) 180/90  Pulse: 64  Height: 5\' 6"  (1.676 m)  Weight: 105 lb 6.4 oz (47.8 kg)  SpO2: 98%  BMI (Calculated): 17.02    Physical Exam Vitals and nursing note reviewed.  Constitutional:      General: She is awake.     Appearance: Normal appearance. She is underweight. She is ill-appearing.  HENT:     Head: Normocephalic.  Eyes:     Extraocular Movements: Extraocular movements intact.     Conjunctiva/sclera: Conjunctivae normal.     Pupils: Pupils are equal, round, and reactive to light.  Cardiovascular:     Rate and Rhythm: Normal rate.  Pulmonary:     Effort: Pulmonary effort is normal.  Neurological:     General:  No focal deficit present.     Mental Status: She is alert and oriented to person, place, and time. Mental status is at baseline.  Psychiatric:        Mood and Affect: Mood normal.        Behavior: Behavior normal. Behavior is cooperative.        Thought Content: Thought content normal.        Judgment: Judgment normal.      No results found for any visits on 11/26/23.  Recent Results (from the past 2160 hours)  Aerobic culture     Status: Abnormal   Collection Time: 10/31/23 10:55 AM  Result Value Ref Range   Aerobic Bacterial Culture Final report (A)    Organism ID, Bacteria Staphylococcus aureus (A)     Comment: Based on susceptibility to oxacillin this isolate would be susceptible to: *Penicillinase-stable penicillins, such as:   Cloxacillin, Dicloxacillin, Nafcillin *Beta-lactam combination agents, such as:   Amoxicillin-clavulanic acid, Ampicillin-sulbactam,   Piperacillin-tazobactam *Oral cephems, such as:   Cefaclor, Cefdinir, Cefpodoxime, Cefprozil, Cefuroxime,   Cephalexin, Loracarbef *Parenteral cephems, such as:   Cefazolin, Cefepime, Cefotaxime, Cefotetan, Ceftaroline,   Ceftizoxime, Ceftriaxone, Cefuroxime *Carbapenems, such as:   Doripenem, Ertapenem, Imipenem, Meropenem Heavy growth    Organism ID, Bacteria Comment (A)     Comment: Corynebacterium striatum Heavy growth Susceptibility not normally performed on this organism.    Organism ID, Bacteria Comment     Comment: Multiple negative rods present, none predominant. This is indicative of a heavily colonized/contaminated specimen site. No further microbiological characterization will be done as it will not provide clinically relevant information. Moderate growth    Organism ID, Bacteria Not applicable    Antimicrobial Susceptibility Comment     Comment:       ** S = Susceptible; I = Intermediate; R = Resistant **                    P = Positive; N = Negative             MICS are expressed in  micrograms per mL    Antibiotic                 RSLT#1    RSLT#2    RSLT#3    RSLT#4 Ciprofloxacin                  R Clindamycin                    S Erythromycin                   R Gentamicin                     S Levofloxacin                   I Linezolid                      S Moxifloxacin                   I Oxacillin                      S Penicillin                     R Quinupristin/Dalfopristin      S Rifampin                       S Tetracycline                   S Trimethoprim/Sulfa             S Vancomycin                     S   Rad Onc Aria Session Summary     Status: None   Collection Time: 12/02/23 10:14 AM  Result Value Ref  Range   Course ID [email or web address]    Course Intent Curative    Course Start Date 11/26/2023  1:24 PM    Session Number 1    Course First Treatment Date 12/02/2023 10:03 AM    Course Last Treatment Date 12/02/2023 10:03 AM    Course Elapsed Days 0    Reference Point ID Rt Cheek DP    Reference Point Dosage Given to Date 2.2 Gy   Reference Point Session Dosage Given 2.2 Gy   Plan ID [email or web address]_BO    Plan Fractions Treated to Date 1    Plan Total Fractions Prescribed 20    Plan Prescribed Dose Per Fraction 2.2 Gy   Plan Total Prescribed Dose 44.000000 Gy   Plan Primary Reference Point Rt Cheek DP   Rad Onc Aria Session Summary     Status: None   Collection Time: 12/02/23 10:23 AM  Result Value Ref Range   Course ID [email or web address]    Course Intent Curative    Course Start Date 11/26/2023  1:24 PM    Session Number 2    Course First Treatment Date 12/02/2023 10:03 AM    Course Last Treatment Date 12/02/2023 10:22 AM    Course Elapsed Days 0    Reference Point ID Rt Nose DP    Reference Point Dosage Given to Date 2.2 Gy   Reference Point Session Dosage Given 2.2 Gy   Plan ID [email or web address]_BO    Plan Fractions Treated to Date 1    Plan Total Fractions Prescribed 20    Plan Prescribed Dose Per Fraction 2.2 Gy   Plan Total Prescribed Dose 44.000000 Gy   Plan  Primary Reference Point Rt Nose DP   Rad Onc Aria Session Summary     Status: None   Collection Time: 12/03/23 11:30 AM  Result Value Ref Range   Course ID [email or web address]    Course Intent Curative    Course Start Date 11/26/2023  1:24 PM    Session Number 3    Course First Treatment Date 12/02/2023 10:03 AM    Course Last Treatment Date 12/03/2023 11:29 AM    Course Elapsed Days 1    Reference Point ID Rt Cheek DP    Reference Point Dosage Given to Date 4.4 Gy   Reference Point Session Dosage Given 2.2 Gy   Reference Point ID Rt Nose DP    Reference Point Dosage Given to Date 4.4 Gy   Reference Point Session Dosage Given 2.2 Gy   Plan ID [email or web address]_BO    Plan Fractions Treated to Date 2    Plan Total Fractions Prescribed 20    Plan Prescribed Dose Per Fraction 2.2 Gy   Plan Total Prescribed Dose 44.000000 Gy   Plan Primary Reference Point Rt Cheek DP    Plan ID [email or web address]_BO    Plan Fractions Treated to Date 2    Plan Total Fractions Prescribed 20    Plan Prescribed Dose Per Fraction 2.2 Gy   Plan Total Prescribed Dose 44.000000 Gy   Plan Primary Reference Point Rt Nose DP   Rad Onc Aria Session Summary     Status: None   Collection Time: 12/04/23  9:44 AM  Result Value Ref Range   Course ID [email or web address]    Course Intent Curative    Course Start Date 11/26/2023  1:24 PM    Session Number 4    Course First Treatment Date 12/02/2023 10:03 AM    Course Last Treatment Date 12/04/2023  9:43 AM  Course Elapsed Days 2    Reference Point ID Rt Cheek DP    Reference Point Dosage Given to Date 6.6 Gy   Reference Point Session Dosage Given 2.2 Gy   Reference Point ID Rt Nose DP    Reference Point Dosage Given to Date 6.6 Gy   Reference Point Session Dosage Given 2.2 Gy   Plan ID [email or web address]_BO    Plan Fractions Treated to Date 3    Plan Total Fractions Prescribed 20    Plan Prescribed Dose Per Fraction 2.2 Gy   Plan Total Prescribed Dose 44.000000 Gy   Plan Primary Reference Point Rt Cheek DP     Plan ID [email or web address]_BO    Plan Fractions Treated to Date 3    Plan Total Fractions Prescribed 20    Plan Prescribed Dose Per Fraction 2.2 Gy   Plan Total Prescribed Dose 44.000000 Gy   Plan Primary Reference Point Rt Nose DP   Rad Onc Aria Session Summary     Status: None   Collection Time: 12/05/23 11:36 AM  Result Value Ref Range   Course ID [email or web address]    Course Intent Curative    Course Start Date 11/26/2023  1:24 PM    Session Number 5    Course First Treatment Date 12/02/2023 10:03 AM    Course Last Treatment Date 12/05/2023 11:35 AM    Course Elapsed Days 3    Reference Point ID Rt Cheek DP    Reference Point Dosage Given to Date 8.8 Gy   Reference Point Session Dosage Given 2.2 Gy   Reference Point ID Rt Nose DP    Reference Point Dosage Given to Date 8.8 Gy   Reference Point Session Dosage Given 2.2 Gy   Plan ID [email or web address]_BO    Plan Fractions Treated to Date 4    Plan Total Fractions Prescribed 20    Plan Prescribed Dose Per Fraction 2.2 Gy   Plan Total Prescribed Dose 44.000000 Gy   Plan Primary Reference Point Rt Cheek DP    Plan ID [email or web address]_BO    Plan Fractions Treated to Date 4    Plan Total Fractions Prescribed 20    Plan Prescribed Dose Per Fraction 2.2 Gy   Plan Total Prescribed Dose 44.000000 Gy   Plan Primary Reference Point Rt Nose DP   Rad Onc Aria Session Summary     Status: None   Collection Time: 12/08/23 10:07 AM  Result Value Ref Range   Course ID [email or web address]    Course Intent Curative    Course Start Date 11/26/2023  1:24 PM    Session Number 6    Course First Treatment Date 12/02/2023 10:03 AM    Course Last Treatment Date 12/08/2023 10:07 AM    Course Elapsed Days 6    Reference Point ID Rt Cheek DP    Reference Point Dosage Given to Date 11 Gy   Reference Point Session Dosage Given 2.2 Gy   Reference Point ID Rt Nose DP    Reference Point Dosage Given to Date 11 Gy   Reference Point Session Dosage Given 2.2 Gy   Plan ID [email or web address]_BO    Plan Fractions  Treated to Date 5    Plan Total Fractions Prescribed 20    Plan Prescribed Dose Per Fraction 2.2 Gy   Plan Total Prescribed Dose 44.000000 Gy   Plan Primary Reference Point Rt Cheek DP    Plan ID [email or web address]_BO    Plan Fractions Treated to Date 5  Plan Total Fractions Prescribed 20    Plan Prescribed Dose Per Fraction 2.2 Gy   Plan Total Prescribed Dose 44.000000 Gy   Plan Primary Reference Point Rt Nose DP   Rad Onc Aria Session Summary     Status: None   Collection Time: 12/09/23 10:05 AM  Result Value Ref Range   Course ID [email or web address]    Course Intent Curative    Course Start Date 11/26/2023  1:24 PM    Session Number 7    Course First Treatment Date 12/02/2023 10:03 AM    Course Last Treatment Date 12/09/2023 10:05 AM    Course Elapsed Days 7    Reference Point ID Rt Cheek DP    Reference Point Dosage Given to Date 13.2 Gy   Reference Point Session Dosage Given 2.2 Gy   Reference Point ID Rt Nose DP    Reference Point Dosage Given to Date 13.2 Gy   Reference Point Session Dosage Given 2.2 Gy   Plan ID [email or web address]_BO    Plan Fractions Treated to Date 6    Plan Total Fractions Prescribed 20    Plan Prescribed Dose Per Fraction 2.2 Gy   Plan Total Prescribed Dose 44.000000 Gy   Plan Primary Reference Point Rt Cheek DP    Plan ID [email or web address]_Nose_BO    Plan Fractions Treated to Date 6    Plan Total Fractions Prescribed 20    Plan Prescribed Dose Per Fraction 2.2 Gy   Plan Total Prescribed Dose 44.000000 Gy   Plan Primary Reference Point Rt Nose DP   Rad Onc Aria Session Summary     Status: None   Collection Time: 12/10/23  9:48 AM  Result Value Ref Range   Course ID [email or web address]    Course Intent Curative    Course Start Date 11/26/2023  1:24 PM    Session Number 8    Course First Treatment Date 12/02/2023 10:03 AM    Course Last Treatment Date 12/10/2023  9:47 AM    Course Elapsed Days 8    Reference Point ID Rt Cheek DP    Reference Point Dosage Given to Date 15.4 Gy   Reference Point  Session Dosage Given 2.2 Gy   Reference Point ID Rt Nose DP    Reference Point Dosage Given to Date 15.4 Gy   Reference Point Session Dosage Given 2.2 Gy   Plan ID [email or web address]_BO    Plan Fractions Treated to Date 7    Plan Total Fractions Prescribed 20    Plan Prescribed Dose Per Fraction 2.2 Gy   Plan Total Prescribed Dose 44.000000 Gy   Plan Primary Reference Point Rt Cheek DP    Plan ID [email or web address]_Nose_BO    Plan Fractions Treated to Date 7    Plan Total Fractions Prescribed 20    Plan Prescribed Dose Per Fraction 2.2 Gy   Plan Total Prescribed Dose 44.000000 Gy   Plan Primary Reference Point Rt Nose DP   Rad Onc Aria Session Summary     Status: None   Collection Time: 12/11/23  9:50 AM  Result Value Ref Range   Course ID [email or web address]    Course Intent Curative    Course Start Date 11/26/2023  1:24 PM    Session Number 9    Course First Treatment Date 12/02/2023 10:03 AM    Course Last Treatment Date 12/11/2023  9:49 AM    Course Elapsed Days 9    Reference Point ID Rt Cheek DP    Reference Point  Dosage Given to Date 17.6 Gy   Reference Point Session Dosage Given 2.2 Gy   Reference Point ID Rt Nose DP    Reference Point Dosage Given to Date 17.6 Gy   Reference Point Session Dosage Given 2.2 Gy   Plan ID [email or web address]_BO    Plan Fractions Treated to Date 8    Plan Total Fractions Prescribed 20    Plan Prescribed Dose Per Fraction 2.2 Gy   Plan Total Prescribed Dose 44.000000 Gy   Plan Primary Reference Point Rt Cheek DP    Plan ID [email or web address]_BO    Plan Fractions Treated to Date 8    Plan Total Fractions Prescribed 20    Plan Prescribed Dose Per Fraction 2.2 Gy   Plan Total Prescribed Dose 44.000000 Gy   Plan Primary Reference Point Rt Nose DP   Rad Onc Aria Session Summary     Status: None   Collection Time: 12/12/23  9:48 AM  Result Value Ref Range   Course ID [email or web address]    Course Intent Curative    Course Start Date 11/26/2023  1:24 PM    Session Number 10    Course First Treatment Date  12/02/2023 10:03 AM    Course Last Treatment Date 12/12/2023  9:47 AM    Course Elapsed Days 10    Reference Point ID Rt Cheek DP    Reference Point Dosage Given to Date 19.8 Gy   Reference Point Session Dosage Given 2.2 Gy   Reference Point ID Rt Nose DP    Reference Point Dosage Given to Date 19.8 Gy   Reference Point Session Dosage Given 2.2 Gy   Plan ID [email or web address]_BO    Plan Fractions Treated to Date 9    Plan Total Fractions Prescribed 20    Plan Prescribed Dose Per Fraction 2.2 Gy   Plan Total Prescribed Dose 44.000000 Gy   Plan Primary Reference Point Rt Cheek DP    Plan ID [email or web address]_BO    Plan Fractions Treated to Date 9    Plan Total Fractions Prescribed 20    Plan Prescribed Dose Per Fraction 2.2 Gy   Plan Total Prescribed Dose 44.000000 Gy   Plan Primary Reference Point Rt Nose DP   Rad Onc Aria Session Summary     Status: None   Collection Time: 12/15/23  9:54 AM  Result Value Ref Range   Course ID C1_HN    Course Intent Curative    Course Start Date 11/26/2023  1:24 PM    Session Number 11    Course First Treatment Date 12/02/2023 10:03 AM    Course Last Treatment Date 12/15/2023  9:53 AM    Course Elapsed Days 13    Reference Point ID Rt Cheek DP    Reference Point Dosage Given to Date 22 Gy   Reference Point Session Dosage Given 2.2 Gy   Reference Point ID Rt Nose DP    Reference Point Dosage Given to Date 22 Gy   Reference Point Session Dosage Given 2.2 Gy   Plan ID [email or web address]_BO    Plan Fractions Treated to Date 10    Plan Total Fractions Prescribed 20    Plan Prescribed Dose Per Fraction 2.2 Gy   Plan Total Prescribed Dose 44.000000 Gy   Plan Primary Reference Point Rt Cheek DP    Plan ID [email or web address]_BO    Plan Fractions Treated to Date 10    Plan Total Fractions Prescribed 20    Plan Prescribed Dose Per Fraction 2.2 Gy  Plan Total Prescribed Dose 44.000000 Gy   Plan Primary Reference Point Rt Nose DP   Rad Onc Aria Session Summary     Status: None    Collection Time: 12/16/23  9:54 AM  Result Value Ref Range   Course ID [email or web address]    Course Intent Curative    Course Start Date 11/26/2023  1:24 PM    Session Number 12    Course First Treatment Date 12/02/2023 10:03 AM    Course Last Treatment Date 12/16/2023  9:53 AM    Course Elapsed Days 14    Reference Point ID Rt Cheek DP    Reference Point Dosage Given to Date 24.2 Gy   Reference Point Session Dosage Given 2.2 Gy   Reference Point ID Rt Nose DP    Reference Point Dosage Given to Date 24.2 Gy   Reference Point Session Dosage Given 2.2 Gy   Plan ID [email or web address]_BO    Plan Fractions Treated to Date 11    Plan Total Fractions Prescribed 20    Plan Prescribed Dose Per Fraction 2.2 Gy   Plan Total Prescribed Dose 44.000000 Gy   Plan Primary Reference Point Rt Cheek DP    Plan ID [email or web address]_BO    Plan Fractions Treated to Date 11    Plan Total Fractions Prescribed 20    Plan Prescribed Dose Per Fraction 2.2 Gy   Plan Total Prescribed Dose 44.000000 Gy   Plan Primary Reference Point Rt Nose DP   Rad Onc Aria Session Summary     Status: None   Collection Time: 12/17/23 10:01 AM  Result Value Ref Range   Course ID [email or web address]    Course Intent Curative    Course Start Date 11/26/2023  1:24 PM    Session Number 13    Course First Treatment Date 12/02/2023 10:03 AM    Course Last Treatment Date 12/17/2023 10:00 AM    Course Elapsed Days 15    Reference Point ID Rt Cheek DP    Reference Point Dosage Given to Date 26.4 Gy   Reference Point Session Dosage Given 2.2 Gy   Reference Point ID Rt Nose DP    Reference Point Dosage Given to Date 26.4 Gy   Reference Point Session Dosage Given 2.2 Gy   Plan ID [email or web address]_BO    Plan Fractions Treated to Date 12    Plan Total Fractions Prescribed 20    Plan Prescribed Dose Per Fraction 2.2 Gy   Plan Total Prescribed Dose 44.000000 Gy   Plan Primary Reference Point Rt Cheek DP    Plan ID [email or web address]_BO    Plan Fractions Treated to Date 12    Plan Total  Fractions Prescribed 20    Plan Prescribed Dose Per Fraction 2.2 Gy   Plan Total Prescribed Dose 44.000000 Gy   Plan Primary Reference Point Rt Nose DP   Rad Onc Aria Session Summary     Status: None   Collection Time: 12/18/23  9:53 AM  Result Value Ref Range   Course ID [email or web address]    Course Intent Curative    Course Start Date 11/26/2023  1:24 PM    Session Number 14    Course First Treatment Date 12/02/2023 10:03 AM    Course Last Treatment Date 12/18/2023  9:52 AM    Course Elapsed Days 16    Reference Point ID Rt Cheek DP    Reference Point Dosage Given to Date 28.6 Gy   Reference Point Session Dosage Given 2.2 Gy  Reference Point ID Rt Nose DP    Reference Point Dosage Given to Date 28.6 Gy   Reference Point Session Dosage Given 2.2 Gy   Plan ID [email or web address]_BO    Plan Fractions Treated to Date 13    Plan Total Fractions Prescribed 20    Plan Prescribed Dose Per Fraction 2.2 Gy   Plan Total Prescribed Dose 44.000000 Gy   Plan Primary Reference Point Rt Cheek DP    Plan ID [email or web address]_BO    Plan Fractions Treated to Date 13    Plan Total Fractions Prescribed 20    Plan Prescribed Dose Per Fraction 2.2 Gy   Plan Total Prescribed Dose 44.000000 Gy   Plan Primary Reference Point Rt Nose DP   Rad Onc Aria Session Summary     Status: None   Collection Time: 12/19/23  9:57 AM  Result Value Ref Range   Course ID [email or web address]    Course Intent Curative    Course Start Date 11/26/2023  1:24 PM    Session Number 15    Course First Treatment Date 12/02/2023 10:03 AM    Course Last Treatment Date 12/19/2023  9:57 AM    Course Elapsed Days 17    Reference Point ID Rt Cheek DP    Reference Point Dosage Given to Date 30.8 Gy   Reference Point Session Dosage Given 2.2 Gy   Reference Point ID Rt Nose DP    Reference Point Dosage Given to Date 30.8 Gy   Reference Point Session Dosage Given 2.2 Gy   Plan ID [email or web address]_BO    Plan Fractions Treated to Date 14    Plan Total Fractions Prescribed 20    Plan  Prescribed Dose Per Fraction 2.2 Gy   Plan Total Prescribed Dose 44.000000 Gy   Plan Primary Reference Point Rt Cheek DP    Plan ID [email or web address]_BO    Plan Fractions Treated to Date 14    Plan Total Fractions Prescribed 20    Plan Prescribed Dose Per Fraction 2.2 Gy   Plan Total Prescribed Dose 44.000000 Gy   Plan Primary Reference Point Rt Nose DP   Rad Onc Aria Session Summary     Status: None   Collection Time: 12/22/23  9:57 AM  Result Value Ref Range   Course ID [email or web address]    Course Intent Curative    Course Start Date 11/26/2023  1:24 PM    Session Number 16    Course First Treatment Date 12/02/2023 10:03 AM    Course Last Treatment Date 12/22/2023  9:56 AM    Course Elapsed Days 20    Reference Point ID Rt Cheek DP    Reference Point Dosage Given to Date 33 Gy   Reference Point Session Dosage Given 2.2 Gy   Reference Point ID Rt Nose DP    Reference Point Dosage Given to Date 33 Gy   Reference Point Session Dosage Given 2.2 Gy   Plan ID [email or web address]_BO    Plan Fractions Treated to Date 15    Plan Total Fractions Prescribed 20    Plan Prescribed Dose Per Fraction 2.2 Gy   Plan Total Prescribed Dose 44.000000 Gy   Plan Primary Reference Point Rt Cheek DP    Plan ID [email or web address]    Plan Fractions Treated to Date 15    Plan Total Fractions Prescribed 20    Plan Prescribed Dose Per Fraction 2.2 Gy   Plan Total Prescribed Dose 44.000000 Gy   Plan Primary Reference Point Rt Nose DP  Rad Jeralene Huff Session Summary     Status: None   Collection Time: 12/23/23  9:47 AM  Result Value Ref Range   Course ID [email or web address]    Course Intent Curative    Course Start Date 11/26/2023  1:24 PM    Session Number 17    Course First Treatment Date 12/02/2023 10:03 AM    Course Last Treatment Date 12/23/2023  9:46 AM    Course Elapsed Days 21    Reference Point ID Rt Cheek DP    Reference Point Dosage Given to Date 35.2 Gy   Reference Point Session Dosage Given 2.2 Gy   Reference Point ID Rt Nose DP     Reference Point Dosage Given to Date 35.2 Gy   Reference Point Session Dosage Given 2.2 Gy   Plan ID HN_R_Cheek_BO    Plan Fractions Treated to Date 16    Plan Total Fractions Prescribed 20    Plan Prescribed Dose Per Fraction 2.2 Gy   Plan Total Prescribed Dose 44.000000 Gy   Plan Primary Reference Point Rt Cheek DP    Plan ID HN_R_Nose_BO    Plan Fractions Treated to Date 16    Plan Total Fractions Prescribed 20    Plan Prescribed Dose Per Fraction 2.2 Gy   Plan Total Prescribed Dose 44.000000 Gy   Plan Primary Reference Point Rt Nose DP   Rad Onc Aria Session Summary     Status: None   Collection Time: 12/24/23  9:59 AM  Result Value Ref Range   Course ID C1_HN    Course Intent Curative    Course Start Date 11/26/2023  1:24 PM    Session Number 18    Course First Treatment Date 12/02/2023 10:03 AM    Course Last Treatment Date 12/24/2023  9:58 AM    Course Elapsed Days 22    Reference Point ID Rt Cheek DP    Reference Point Dosage Given to Date 37.4 Gy   Reference Point Session Dosage Given 2.2 Gy   Reference Point ID Rt Nose DP    Reference Point Dosage Given to Date 37.4 Gy   Reference Point Session Dosage Given 2.2 Gy   Plan ID HN_R_Cheek_BO    Plan Fractions Treated to Date 17    Plan Total Fractions Prescribed 20    Plan Prescribed Dose Per Fraction 2.2 Gy   Plan Total Prescribed Dose 44.000000 Gy   Plan Primary Reference Point Rt Cheek DP    Plan ID HN_R_Nose_BO    Plan Fractions Treated to Date 17    Plan Total Fractions Prescribed 20    Plan Prescribed Dose Per Fraction 2.2 Gy   Plan Total Prescribed Dose 44.000000 Gy   Plan Primary Reference Point Rt Nose DP   Rad Onc Aria Session Summary     Status: None   Collection Time: 12/25/23 10:00 AM  Result Value Ref Range   Course ID [email or web address]    Course Intent Curative    Course Start Date 11/26/2023  1:24 PM    Session Number 19    Course First Treatment Date 12/02/2023 10:03 AM    Course Last Treatment Date 12/25/2023   9:59 AM    Course Elapsed Days 23    Reference Point ID Rt Cheek DP    Reference Point Dosage Given to Date 39.6 Gy   Reference Point Session Dosage Given 2.2 Gy   Reference Point ID Rt Nose DP    Reference Point Dosage Given to Date  39.6 Gy   Reference Point Session Dosage Given 2.2 Gy   Plan ID HN_R_Cheek_BO    Plan Fractions Treated to Date 18    Plan Total Fractions Prescribed 20    Plan Prescribed Dose Per Fraction 2.2 Gy   Plan Total Prescribed Dose 44.000000 Gy   Plan Primary Reference Point Rt Cheek DP    Plan ID HN_R_Nose_BO    Plan Fractions Treated to Date 18    Plan Total Fractions Prescribed 20    Plan Prescribed Dose Per Fraction 2.2 Gy   Plan Total Prescribed Dose 44.000000 Gy   Plan Primary Reference Point Rt Nose DP   Rad Onc Aria Session Summary     Status: None   Collection Time: 12/26/23  9:49 AM  Result Value Ref Range   Course ID C1_HN    Course Intent Curative    Course Start Date 11/26/2023  1:24 PM    Session Number 20    Course First Treatment Date 12/02/2023 10:03 AM    Course Last Treatment Date 12/26/2023  9:48 AM    Course Elapsed Days 24    Reference Point ID Rt Cheek DP    Reference Point Dosage Given to Date 41.8 Gy   Reference Point Session Dosage Given 2.2 Gy   Reference Point ID Rt Nose DP    Reference Point Dosage Given to Date 41.8 Gy   Reference Point Session Dosage Given 2.2 Gy   Plan ID HN_R_Cheek_BO    Plan Fractions Treated to Date 19    Plan Total Fractions Prescribed 20    Plan Prescribed Dose Per Fraction 2.2 Gy   Plan Total Prescribed Dose 44.000000 Gy   Plan Primary Reference Point Rt Cheek DP    Plan ID HN_R_Nose_BO    Plan Fractions Treated to Date 19    Plan Total Fractions Prescribed 20    Plan Prescribed Dose Per Fraction 2.2 Gy   Plan Total Prescribed Dose 44.000000 Gy   Plan Primary Reference Point Rt Nose DP   Rad Onc Aria Session Summary     Status: None   Collection Time: 12/29/23  9:46 AM  Result Value Ref Range    Course ID C1_HN    Course Intent Curative    Course Start Date 11/26/2023  1:24 PM    Session Number 21    Course First Treatment Date 12/02/2023 10:03 AM    Course Last Treatment Date 12/29/2023  9:46 AM    Course Elapsed Days 27    Reference Point ID Rt Cheek DP    Reference Point Dosage Given to Date 44 Gy   Reference Point Session Dosage Given 2.2 Gy   Reference Point ID Rt Nose DP    Reference Point Dosage Given to Date 44 Gy   Reference Point Session Dosage Given 2.2 Gy   Plan ID HN_R_Cheek_BO    Plan Fractions Treated to Date 20    Plan Total Fractions Prescribed 20    Plan Prescribed Dose Per Fraction 2.2 Gy   Plan Total Prescribed Dose 44.000000 Gy   Plan Primary Reference Point Rt Cheek DP    Plan ID HN_R_Nose_BO    Plan Fractions Treated to Date 20    Plan Total Fractions Prescribed 20    Plan Prescribed Dose Per Fraction 2.2 Gy   Plan Total Prescribed Dose 44.000000 Gy   Plan Primary Reference Point Rt Nose DP   Lactic acid, plasma     Status: None   Collection Time:  01/21/24  1:58 PM  Result Value Ref Range   Lactic Acid, Venous 0.8 0.5 - 1.9 mmol/L    Comment: Performed at [facility], [address] [place], [place] [address]  Comprehensive metabolic panel     Status: Abnormal   Collection Time: 01/21/24  1:58 PM  Result Value Ref Range   Sodium 133 (L) 135 - 145 mmol/L   Potassium 4.8 3.5 - 5.1 mmol/L   Chloride 98 98 - 111 mmol/L   CO2 24 22 - 32 mmol/L   Glucose, Bld 104 (H) 70 - 99 mg/dL    Comment: Glucose reference range applies only to samples taken after fasting for at least 8 hours.   BUN 22 8 - 23 mg/dL   Creatinine, Ser 6.04 (H) 0.44 - 1.00 mg/dL   Calcium 8.7 (L) 8.9 - 10.3 mg/dL   Total Protein 6.9 6.5 - 8.1 g/dL   Albumin 3.9 3.5 - 5.0 g/dL   AST 27 15 - 41 U/L   ALT 19 0 - 44 U/L   Alkaline Phosphatase 97 38 - 126 U/L   Total Bilirubin 0.8 0.0 - 1.2 mg/dL   GFR, Estimated 52 (L) >60 mL/min    Comment: (NOTE) Calculated using  the CKD-EPI Creatinine Equation (2021)    Anion gap 11 5 - 15    Comment: Performed at [facility], [address] [place], [place] [address]  CBC with Differential     Status: Abnormal   Collection Time: 01/21/24  1:58 PM  Result Value Ref Range   WBC 7.3 4.0 - 10.5 K/uL   RBC 2.89 (L) 3.87 - 5.11 MIL/uL   Hemoglobin 8.7 (L) 12.0 - 15.0 g/dL   HCT 11.9 (L) 14.7 - 82.9 %   MCV 95.2 80.0 - 100.0 fL   MCH 30.1 26.0 - 34.0 pg   MCHC 31.6 30.0 - 36.0 g/dL   RDW 56.2 13.0 - 86.5 %   Platelets 259 150 - 400 K/uL   nRBC 0.0 0.0 - 0.2 %   Neutrophils Relative % 81 %   Neutro Abs 5.9 1.7 - 7.7 K/uL   Lymphocytes Relative 9 %   Lymphs Abs 0.7 0.7 - 4.0 K/uL   Monocytes Relative 10 %   Monocytes Absolute 0.7 0.1 - 1.0 K/uL   Eosinophils Relative 0 %   Eosinophils Absolute 0.0 0.0 - 0.5 K/uL   Basophils Relative 0 %   Basophils Absolute 0.0 0.0 - 0.1 K/uL   Immature Granulocytes 0 %   Abs Immature Granulocytes 0.03 0.00 - 0.07 K/uL    Comment: Performed at [facility], [address], [place], [place] [address]       Assessment & Plan:   Problem List Items Addressed This Visit       Cardiovascular and Mediastinum   Hypertension - Primary   Patient sees cardiology, they have been adjusting her meds.  Will defer to them for further changes.         Nervous and Auditory   Autonomic neuropathy in diseases classified elsewhere   Patient stable.  Well controlled with current therapy.   Continue current meds.          Musculoskeletal and Integument   Idiopathic scoliosis and kyphoscoliosis   Patient stable.  Well controlled with current therapy.   Continue current meds.        DDD (degenerative disc disease), lumbosacral (Chronic)   Patient stable.  Well controlled with current therapy.  Continue current meds.          Other   Chronic lower extremity pain (2ry area of Pain) (Bilateral) (R>L) (Chronic)   Patient stable.  Well controlled  with current therapy.   Continue current meds.        Underweight   Patient has been trying to make an effort to increase her nutritional intake.  Suggest supplementary shakes or something similar.       Open wound of left lower leg   Continue current wound care regimen.  Will defer to wound center/vascular for further changes to POC.   Recheck at f/u      Open wound of lower leg, right, subsequent encounter   Continue current wound care regimen.  Will defer to wound center/vascular for further changes to POC.   Recheck at f/u       Return in about 1 month (around 12/27/2023).   Total time spent: 20 minutes  Miki Kins, FNP  11/26/2023   This document may have been prepared by [employer] Voice Recognition software and as such may include unintentional dictation errors.

## 2023-11-27 DIAGNOSIS — I4891 Unspecified atrial fibrillation: Secondary | ICD-10-CM | POA: Diagnosis not present

## 2023-11-27 DIAGNOSIS — L97422 Non-pressure chronic ulcer of left heel and midfoot with fat layer exposed: Secondary | ICD-10-CM | POA: Diagnosis not present

## 2023-11-27 DIAGNOSIS — I872 Venous insufficiency (chronic) (peripheral): Secondary | ICD-10-CM | POA: Diagnosis not present

## 2023-11-27 DIAGNOSIS — I110 Hypertensive heart disease with heart failure: Secondary | ICD-10-CM | POA: Diagnosis not present

## 2023-11-27 DIAGNOSIS — Z9181 History of falling: Secondary | ICD-10-CM | POA: Diagnosis not present

## 2023-11-27 DIAGNOSIS — D649 Anemia, unspecified: Secondary | ICD-10-CM | POA: Diagnosis not present

## 2023-11-27 DIAGNOSIS — Q7271 Split foot, right lower limb: Secondary | ICD-10-CM | POA: Diagnosis not present

## 2023-11-27 DIAGNOSIS — Z556 Problems related to health literacy: Secondary | ICD-10-CM | POA: Diagnosis not present

## 2023-11-27 DIAGNOSIS — Z7982 Long term (current) use of aspirin: Secondary | ICD-10-CM | POA: Diagnosis not present

## 2023-11-27 DIAGNOSIS — E569 Vitamin deficiency, unspecified: Secondary | ICD-10-CM | POA: Diagnosis not present

## 2023-11-27 DIAGNOSIS — I739 Peripheral vascular disease, unspecified: Secondary | ICD-10-CM | POA: Diagnosis not present

## 2023-11-27 DIAGNOSIS — Z791 Long term (current) use of non-steroidal anti-inflammatories (NSAID): Secondary | ICD-10-CM | POA: Diagnosis not present

## 2023-11-27 DIAGNOSIS — I2510 Atherosclerotic heart disease of native coronary artery without angina pectoris: Secondary | ICD-10-CM | POA: Diagnosis not present

## 2023-11-27 DIAGNOSIS — L97812 Non-pressure chronic ulcer of other part of right lower leg with fat layer exposed: Secondary | ICD-10-CM | POA: Diagnosis not present

## 2023-11-27 DIAGNOSIS — I5022 Chronic systolic (congestive) heart failure: Secondary | ICD-10-CM | POA: Diagnosis not present

## 2023-11-27 DIAGNOSIS — Z7902 Long term (current) use of antithrombotics/antiplatelets: Secondary | ICD-10-CM | POA: Diagnosis not present

## 2023-11-28 ENCOUNTER — Encounter: Payer: 59 | Admitting: Family

## 2023-11-30 NOTE — Assessment & Plan Note (Signed)
Checking labs today.  Continue current therapy for lipid control. Will modify as needed based on labwork results.  

## 2023-11-30 NOTE — Assessment & Plan Note (Signed)
Patient sees cardiology, they have been adjusting her meds.  Will defer to them for further changes.

## 2023-11-30 NOTE — Assessment & Plan Note (Signed)
Checking labs today.  Will continue supplements as needed.  

## 2023-12-01 ENCOUNTER — Other Ambulatory Visit (INDEPENDENT_AMBULATORY_CARE_PROVIDER_SITE_OTHER): Payer: Self-pay | Admitting: Nurse Practitioner

## 2023-12-01 DIAGNOSIS — C44310 Basal cell carcinoma of skin of unspecified parts of face: Secondary | ICD-10-CM | POA: Diagnosis not present

## 2023-12-01 DIAGNOSIS — I739 Peripheral vascular disease, unspecified: Secondary | ICD-10-CM

## 2023-12-01 DIAGNOSIS — Z510 Encounter for antineoplastic radiation therapy: Secondary | ICD-10-CM | POA: Diagnosis not present

## 2023-12-01 DIAGNOSIS — C44319 Basal cell carcinoma of skin of other parts of face: Secondary | ICD-10-CM | POA: Diagnosis not present

## 2023-12-02 ENCOUNTER — Other Ambulatory Visit: Payer: Self-pay

## 2023-12-02 ENCOUNTER — Ambulatory Visit
Admission: RE | Admit: 2023-12-02 | Discharge: 2023-12-02 | Disposition: A | Discharge status: Home / Self Care | Payer: 59 | Source: Ambulatory Visit | Attending: Radiation Oncology | Admitting: Radiation Oncology

## 2023-12-02 DIAGNOSIS — C44310 Basal cell carcinoma of skin of unspecified parts of face: Secondary | ICD-10-CM | POA: Diagnosis not present

## 2023-12-02 DIAGNOSIS — I4891 Unspecified atrial fibrillation: Secondary | ICD-10-CM | POA: Diagnosis not present

## 2023-12-02 DIAGNOSIS — D649 Anemia, unspecified: Secondary | ICD-10-CM | POA: Diagnosis not present

## 2023-12-02 DIAGNOSIS — Z9181 History of falling: Secondary | ICD-10-CM | POA: Diagnosis not present

## 2023-12-02 DIAGNOSIS — I110 Hypertensive heart disease with heart failure: Secondary | ICD-10-CM | POA: Diagnosis not present

## 2023-12-02 DIAGNOSIS — I872 Venous insufficiency (chronic) (peripheral): Secondary | ICD-10-CM | POA: Diagnosis not present

## 2023-12-02 DIAGNOSIS — L97812 Non-pressure chronic ulcer of other part of right lower leg with fat layer exposed: Secondary | ICD-10-CM | POA: Diagnosis not present

## 2023-12-02 DIAGNOSIS — Z791 Long term (current) use of non-steroidal anti-inflammatories (NSAID): Secondary | ICD-10-CM | POA: Diagnosis not present

## 2023-12-02 DIAGNOSIS — Z510 Encounter for antineoplastic radiation therapy: Secondary | ICD-10-CM | POA: Diagnosis not present

## 2023-12-02 DIAGNOSIS — Z7902 Long term (current) use of antithrombotics/antiplatelets: Secondary | ICD-10-CM | POA: Diagnosis not present

## 2023-12-02 DIAGNOSIS — Q7271 Split foot, right lower limb: Secondary | ICD-10-CM | POA: Diagnosis not present

## 2023-12-02 DIAGNOSIS — Z7982 Long term (current) use of aspirin: Secondary | ICD-10-CM | POA: Diagnosis not present

## 2023-12-02 DIAGNOSIS — I739 Peripheral vascular disease, unspecified: Secondary | ICD-10-CM | POA: Diagnosis not present

## 2023-12-02 DIAGNOSIS — I2510 Atherosclerotic heart disease of native coronary artery without angina pectoris: Secondary | ICD-10-CM | POA: Diagnosis not present

## 2023-12-02 DIAGNOSIS — Z556 Problems related to health literacy: Secondary | ICD-10-CM | POA: Diagnosis not present

## 2023-12-02 DIAGNOSIS — L97422 Non-pressure chronic ulcer of left heel and midfoot with fat layer exposed: Secondary | ICD-10-CM | POA: Diagnosis not present

## 2023-12-02 DIAGNOSIS — I5022 Chronic systolic (congestive) heart failure: Secondary | ICD-10-CM | POA: Diagnosis not present

## 2023-12-02 DIAGNOSIS — E569 Vitamin deficiency, unspecified: Secondary | ICD-10-CM | POA: Diagnosis not present

## 2023-12-02 LAB — RAD ONC ARIA SESSION SUMMARY
Course Elapsed Days: 0
Course Elapsed Days: 0
Plan Fractions Treated to Date: 1
Plan Fractions Treated to Date: 1
Plan Prescribed Dose Per Fraction: 2.2 Gy
Plan Prescribed Dose Per Fraction: 2.2 Gy
Plan Total Fractions Prescribed: 20
Plan Total Fractions Prescribed: 20
Plan Total Prescribed Dose: 44 Gy
Plan Total Prescribed Dose: 44 Gy
Reference Point Dosage Given to Date: 2.2 Gy
Reference Point Dosage Given to Date: 2.2 Gy
Reference Point Session Dosage Given: 2.2 Gy
Reference Point Session Dosage Given: 2.2 Gy
Session Number: 1
Session Number: 2

## 2023-12-03 ENCOUNTER — Other Ambulatory Visit: Payer: Self-pay

## 2023-12-03 ENCOUNTER — Ambulatory Visit
Admission: RE | Admit: 2023-12-03 | Discharge: 2023-12-03 | Disposition: A | Discharge status: Home / Self Care | Payer: 59 | Source: Ambulatory Visit | Attending: Radiation Oncology | Admitting: Radiation Oncology

## 2023-12-03 DIAGNOSIS — C44310 Basal cell carcinoma of skin of unspecified parts of face: Secondary | ICD-10-CM | POA: Diagnosis not present

## 2023-12-03 DIAGNOSIS — Z510 Encounter for antineoplastic radiation therapy: Secondary | ICD-10-CM | POA: Diagnosis not present

## 2023-12-03 LAB — RAD ONC ARIA SESSION SUMMARY
Course Elapsed Days: 1
Plan Fractions Treated to Date: 2
Plan Fractions Treated to Date: 2
Plan Prescribed Dose Per Fraction: 2.2 Gy
Plan Prescribed Dose Per Fraction: 2.2 Gy
Plan Total Fractions Prescribed: 20
Plan Total Fractions Prescribed: 20
Plan Total Prescribed Dose: 44 Gy
Plan Total Prescribed Dose: 44 Gy
Reference Point Dosage Given to Date: 4.4 Gy
Reference Point Dosage Given to Date: 4.4 Gy
Reference Point Session Dosage Given: 2.2 Gy
Reference Point Session Dosage Given: 2.2 Gy
Session Number: 3

## 2023-12-04 ENCOUNTER — Ambulatory Visit
Admission: RE | Admit: 2023-12-04 | Discharge: 2023-12-04 | Disposition: A | Discharge status: Home / Self Care | Payer: 59 | Source: Ambulatory Visit | Attending: Radiation Oncology | Admitting: Radiation Oncology

## 2023-12-04 ENCOUNTER — Other Ambulatory Visit: Payer: Self-pay

## 2023-12-04 DIAGNOSIS — Z510 Encounter for antineoplastic radiation therapy: Secondary | ICD-10-CM | POA: Diagnosis not present

## 2023-12-04 DIAGNOSIS — H905 Unspecified sensorineural hearing loss: Secondary | ICD-10-CM | POA: Diagnosis not present

## 2023-12-04 DIAGNOSIS — C44310 Basal cell carcinoma of skin of unspecified parts of face: Secondary | ICD-10-CM | POA: Diagnosis not present

## 2023-12-04 LAB — RAD ONC ARIA SESSION SUMMARY
Course Elapsed Days: 2
Plan Fractions Treated to Date: 3
Plan Fractions Treated to Date: 3
Plan Prescribed Dose Per Fraction: 2.2 Gy
Plan Prescribed Dose Per Fraction: 2.2 Gy
Plan Total Fractions Prescribed: 20
Plan Total Fractions Prescribed: 20
Plan Total Prescribed Dose: 44 Gy
Plan Total Prescribed Dose: 44 Gy
Reference Point Dosage Given to Date: 6.6 Gy
Reference Point Dosage Given to Date: 6.6 Gy
Reference Point Session Dosage Given: 2.2 Gy
Reference Point Session Dosage Given: 2.2 Gy
Session Number: 4

## 2023-12-05 ENCOUNTER — Ambulatory Visit
Admission: RE | Admit: 2023-12-05 | Discharge: 2023-12-05 | Disposition: A | Discharge status: Home / Self Care | Payer: 59 | Source: Ambulatory Visit | Attending: Radiation Oncology | Admitting: Radiation Oncology

## 2023-12-05 ENCOUNTER — Other Ambulatory Visit: Payer: Self-pay

## 2023-12-05 ENCOUNTER — Ambulatory Visit (INDEPENDENT_AMBULATORY_CARE_PROVIDER_SITE_OTHER): Payer: 59 | Admitting: Nurse Practitioner

## 2023-12-05 ENCOUNTER — Encounter (INDEPENDENT_AMBULATORY_CARE_PROVIDER_SITE_OTHER): Payer: 59

## 2023-12-05 DIAGNOSIS — Z9181 History of falling: Secondary | ICD-10-CM | POA: Diagnosis not present

## 2023-12-05 DIAGNOSIS — Z556 Problems related to health literacy: Secondary | ICD-10-CM | POA: Diagnosis not present

## 2023-12-05 DIAGNOSIS — L97812 Non-pressure chronic ulcer of other part of right lower leg with fat layer exposed: Secondary | ICD-10-CM | POA: Diagnosis not present

## 2023-12-05 DIAGNOSIS — I5022 Chronic systolic (congestive) heart failure: Secondary | ICD-10-CM | POA: Diagnosis not present

## 2023-12-05 DIAGNOSIS — E569 Vitamin deficiency, unspecified: Secondary | ICD-10-CM | POA: Diagnosis not present

## 2023-12-05 DIAGNOSIS — I4891 Unspecified atrial fibrillation: Secondary | ICD-10-CM | POA: Diagnosis not present

## 2023-12-05 DIAGNOSIS — Z7982 Long term (current) use of aspirin: Secondary | ICD-10-CM | POA: Diagnosis not present

## 2023-12-05 DIAGNOSIS — Z7902 Long term (current) use of antithrombotics/antiplatelets: Secondary | ICD-10-CM | POA: Diagnosis not present

## 2023-12-05 DIAGNOSIS — I110 Hypertensive heart disease with heart failure: Secondary | ICD-10-CM | POA: Diagnosis not present

## 2023-12-05 DIAGNOSIS — C44310 Basal cell carcinoma of skin of unspecified parts of face: Secondary | ICD-10-CM | POA: Diagnosis not present

## 2023-12-05 DIAGNOSIS — Q7271 Split foot, right lower limb: Secondary | ICD-10-CM | POA: Diagnosis not present

## 2023-12-05 DIAGNOSIS — Z791 Long term (current) use of non-steroidal anti-inflammatories (NSAID): Secondary | ICD-10-CM | POA: Diagnosis not present

## 2023-12-05 DIAGNOSIS — I872 Venous insufficiency (chronic) (peripheral): Secondary | ICD-10-CM | POA: Diagnosis not present

## 2023-12-05 DIAGNOSIS — Z510 Encounter for antineoplastic radiation therapy: Secondary | ICD-10-CM | POA: Diagnosis not present

## 2023-12-05 DIAGNOSIS — I2510 Atherosclerotic heart disease of native coronary artery without angina pectoris: Secondary | ICD-10-CM | POA: Diagnosis not present

## 2023-12-05 DIAGNOSIS — D649 Anemia, unspecified: Secondary | ICD-10-CM | POA: Diagnosis not present

## 2023-12-05 DIAGNOSIS — I739 Peripheral vascular disease, unspecified: Secondary | ICD-10-CM | POA: Diagnosis not present

## 2023-12-05 DIAGNOSIS — L97422 Non-pressure chronic ulcer of left heel and midfoot with fat layer exposed: Secondary | ICD-10-CM | POA: Diagnosis not present

## 2023-12-05 LAB — RAD ONC ARIA SESSION SUMMARY
Course Elapsed Days: 3
Plan Fractions Treated to Date: 4
Plan Fractions Treated to Date: 4
Plan Prescribed Dose Per Fraction: 2.2 Gy
Plan Prescribed Dose Per Fraction: 2.2 Gy
Plan Total Fractions Prescribed: 20
Plan Total Fractions Prescribed: 20
Plan Total Prescribed Dose: 44 Gy
Plan Total Prescribed Dose: 44 Gy
Reference Point Dosage Given to Date: 8.8 Gy
Reference Point Dosage Given to Date: 8.8 Gy
Reference Point Session Dosage Given: 2.2 Gy
Reference Point Session Dosage Given: 2.2 Gy
Session Number: 5

## 2023-12-08 ENCOUNTER — Ambulatory Visit
Admission: RE | Admit: 2023-12-08 | Discharge: 2023-12-08 | Disposition: A | Discharge status: Home / Self Care | Payer: 59 | Source: Ambulatory Visit | Attending: Radiation Oncology | Admitting: Radiation Oncology

## 2023-12-08 ENCOUNTER — Other Ambulatory Visit: Payer: Self-pay

## 2023-12-08 DIAGNOSIS — Z510 Encounter for antineoplastic radiation therapy: Secondary | ICD-10-CM | POA: Diagnosis not present

## 2023-12-08 DIAGNOSIS — C44319 Basal cell carcinoma of skin of other parts of face: Secondary | ICD-10-CM | POA: Diagnosis not present

## 2023-12-08 DIAGNOSIS — C44310 Basal cell carcinoma of skin of unspecified parts of face: Secondary | ICD-10-CM | POA: Diagnosis not present

## 2023-12-08 LAB — RAD ONC ARIA SESSION SUMMARY
Course Elapsed Days: 6
Plan Fractions Treated to Date: 5
Plan Fractions Treated to Date: 5
Plan Prescribed Dose Per Fraction: 2.2 Gy
Plan Prescribed Dose Per Fraction: 2.2 Gy
Plan Total Fractions Prescribed: 20
Plan Total Fractions Prescribed: 20
Plan Total Prescribed Dose: 44 Gy
Plan Total Prescribed Dose: 44 Gy
Reference Point Dosage Given to Date: 11 Gy
Reference Point Dosage Given to Date: 11 Gy
Reference Point Session Dosage Given: 2.2 Gy
Reference Point Session Dosage Given: 2.2 Gy
Session Number: 6

## 2023-12-09 ENCOUNTER — Other Ambulatory Visit: Payer: Self-pay

## 2023-12-09 ENCOUNTER — Ambulatory Visit
Admission: RE | Admit: 2023-12-09 | Discharge: 2023-12-09 | Disposition: A | Discharge status: Home / Self Care | Payer: 59 | Source: Ambulatory Visit | Attending: Radiation Oncology | Admitting: Radiation Oncology

## 2023-12-09 DIAGNOSIS — Z7982 Long term (current) use of aspirin: Secondary | ICD-10-CM | POA: Diagnosis not present

## 2023-12-09 DIAGNOSIS — Z791 Long term (current) use of non-steroidal anti-inflammatories (NSAID): Secondary | ICD-10-CM | POA: Diagnosis not present

## 2023-12-09 DIAGNOSIS — L97422 Non-pressure chronic ulcer of left heel and midfoot with fat layer exposed: Secondary | ICD-10-CM | POA: Diagnosis not present

## 2023-12-09 DIAGNOSIS — Q7271 Split foot, right lower limb: Secondary | ICD-10-CM | POA: Diagnosis not present

## 2023-12-09 DIAGNOSIS — I2510 Atherosclerotic heart disease of native coronary artery without angina pectoris: Secondary | ICD-10-CM | POA: Diagnosis not present

## 2023-12-09 DIAGNOSIS — Z9181 History of falling: Secondary | ICD-10-CM | POA: Diagnosis not present

## 2023-12-09 DIAGNOSIS — L97812 Non-pressure chronic ulcer of other part of right lower leg with fat layer exposed: Secondary | ICD-10-CM | POA: Diagnosis not present

## 2023-12-09 DIAGNOSIS — Z7902 Long term (current) use of antithrombotics/antiplatelets: Secondary | ICD-10-CM | POA: Diagnosis not present

## 2023-12-09 DIAGNOSIS — C44310 Basal cell carcinoma of skin of unspecified parts of face: Secondary | ICD-10-CM | POA: Diagnosis not present

## 2023-12-09 DIAGNOSIS — Z556 Problems related to health literacy: Secondary | ICD-10-CM | POA: Diagnosis not present

## 2023-12-09 DIAGNOSIS — I739 Peripheral vascular disease, unspecified: Secondary | ICD-10-CM | POA: Diagnosis not present

## 2023-12-09 DIAGNOSIS — I4891 Unspecified atrial fibrillation: Secondary | ICD-10-CM | POA: Diagnosis not present

## 2023-12-09 DIAGNOSIS — E569 Vitamin deficiency, unspecified: Secondary | ICD-10-CM | POA: Diagnosis not present

## 2023-12-09 DIAGNOSIS — D649 Anemia, unspecified: Secondary | ICD-10-CM | POA: Diagnosis not present

## 2023-12-09 DIAGNOSIS — Z510 Encounter for antineoplastic radiation therapy: Secondary | ICD-10-CM | POA: Diagnosis not present

## 2023-12-09 DIAGNOSIS — I5022 Chronic systolic (congestive) heart failure: Secondary | ICD-10-CM | POA: Diagnosis not present

## 2023-12-09 DIAGNOSIS — I110 Hypertensive heart disease with heart failure: Secondary | ICD-10-CM | POA: Diagnosis not present

## 2023-12-09 DIAGNOSIS — I872 Venous insufficiency (chronic) (peripheral): Secondary | ICD-10-CM | POA: Diagnosis not present

## 2023-12-09 LAB — RAD ONC ARIA SESSION SUMMARY
Course Elapsed Days: 7
Plan Fractions Treated to Date: 6
Plan Fractions Treated to Date: 6
Plan Prescribed Dose Per Fraction: 2.2 Gy
Plan Prescribed Dose Per Fraction: 2.2 Gy
Plan Total Fractions Prescribed: 20
Plan Total Fractions Prescribed: 20
Plan Total Prescribed Dose: 44 Gy
Plan Total Prescribed Dose: 44 Gy
Reference Point Dosage Given to Date: 13.2 Gy
Reference Point Dosage Given to Date: 13.2 Gy
Reference Point Session Dosage Given: 2.2 Gy
Reference Point Session Dosage Given: 2.2 Gy
Session Number: 7

## 2023-12-10 ENCOUNTER — Other Ambulatory Visit: Payer: Self-pay

## 2023-12-10 ENCOUNTER — Ambulatory Visit
Admission: RE | Admit: 2023-12-10 | Discharge: 2023-12-10 | Disposition: A | Discharge status: Home / Self Care | Payer: 59 | Source: Ambulatory Visit | Attending: Radiation Oncology | Admitting: Radiation Oncology

## 2023-12-10 DIAGNOSIS — Z510 Encounter for antineoplastic radiation therapy: Secondary | ICD-10-CM | POA: Diagnosis not present

## 2023-12-10 DIAGNOSIS — C44310 Basal cell carcinoma of skin of unspecified parts of face: Secondary | ICD-10-CM | POA: Diagnosis not present

## 2023-12-10 LAB — RAD ONC ARIA SESSION SUMMARY
Course Elapsed Days: 8
Plan Fractions Treated to Date: 7
Plan Fractions Treated to Date: 7
Plan Prescribed Dose Per Fraction: 2.2 Gy
Plan Prescribed Dose Per Fraction: 2.2 Gy
Plan Total Fractions Prescribed: 20
Plan Total Fractions Prescribed: 20
Plan Total Prescribed Dose: 44 Gy
Plan Total Prescribed Dose: 44 Gy
Reference Point Dosage Given to Date: 15.4 Gy
Reference Point Dosage Given to Date: 15.4 Gy
Reference Point Session Dosage Given: 2.2 Gy
Reference Point Session Dosage Given: 2.2 Gy
Session Number: 8

## 2023-12-11 ENCOUNTER — Other Ambulatory Visit: Payer: Self-pay

## 2023-12-11 ENCOUNTER — Ambulatory Visit
Admission: RE | Admit: 2023-12-11 | Discharge: 2023-12-11 | Disposition: A | Discharge status: Home / Self Care | Payer: 59 | Source: Ambulatory Visit | Attending: Radiation Oncology | Admitting: Radiation Oncology

## 2023-12-11 DIAGNOSIS — I4891 Unspecified atrial fibrillation: Secondary | ICD-10-CM | POA: Diagnosis not present

## 2023-12-11 DIAGNOSIS — L97422 Non-pressure chronic ulcer of left heel and midfoot with fat layer exposed: Secondary | ICD-10-CM | POA: Diagnosis not present

## 2023-12-11 DIAGNOSIS — Z7902 Long term (current) use of antithrombotics/antiplatelets: Secondary | ICD-10-CM | POA: Diagnosis not present

## 2023-12-11 DIAGNOSIS — I739 Peripheral vascular disease, unspecified: Secondary | ICD-10-CM | POA: Diagnosis not present

## 2023-12-11 DIAGNOSIS — I872 Venous insufficiency (chronic) (peripheral): Secondary | ICD-10-CM | POA: Diagnosis not present

## 2023-12-11 DIAGNOSIS — Z9181 History of falling: Secondary | ICD-10-CM | POA: Diagnosis not present

## 2023-12-11 DIAGNOSIS — Z791 Long term (current) use of non-steroidal anti-inflammatories (NSAID): Secondary | ICD-10-CM | POA: Diagnosis not present

## 2023-12-11 DIAGNOSIS — I5022 Chronic systolic (congestive) heart failure: Secondary | ICD-10-CM | POA: Diagnosis not present

## 2023-12-11 DIAGNOSIS — Z7982 Long term (current) use of aspirin: Secondary | ICD-10-CM | POA: Diagnosis not present

## 2023-12-11 DIAGNOSIS — C44310 Basal cell carcinoma of skin of unspecified parts of face: Secondary | ICD-10-CM | POA: Diagnosis not present

## 2023-12-11 DIAGNOSIS — D649 Anemia, unspecified: Secondary | ICD-10-CM | POA: Diagnosis not present

## 2023-12-11 DIAGNOSIS — Z510 Encounter for antineoplastic radiation therapy: Secondary | ICD-10-CM | POA: Diagnosis not present

## 2023-12-11 DIAGNOSIS — I2510 Atherosclerotic heart disease of native coronary artery without angina pectoris: Secondary | ICD-10-CM | POA: Diagnosis not present

## 2023-12-11 DIAGNOSIS — E569 Vitamin deficiency, unspecified: Secondary | ICD-10-CM | POA: Diagnosis not present

## 2023-12-11 DIAGNOSIS — I110 Hypertensive heart disease with heart failure: Secondary | ICD-10-CM | POA: Diagnosis not present

## 2023-12-11 DIAGNOSIS — Z556 Problems related to health literacy: Secondary | ICD-10-CM | POA: Diagnosis not present

## 2023-12-11 DIAGNOSIS — Q7271 Split foot, right lower limb: Secondary | ICD-10-CM | POA: Diagnosis not present

## 2023-12-11 DIAGNOSIS — L97812 Non-pressure chronic ulcer of other part of right lower leg with fat layer exposed: Secondary | ICD-10-CM | POA: Diagnosis not present

## 2023-12-11 LAB — RAD ONC ARIA SESSION SUMMARY
Course Elapsed Days: 9
Plan Fractions Treated to Date: 8
Plan Fractions Treated to Date: 8
Plan Prescribed Dose Per Fraction: 2.2 Gy
Plan Prescribed Dose Per Fraction: 2.2 Gy
Plan Total Fractions Prescribed: 20
Plan Total Fractions Prescribed: 20
Plan Total Prescribed Dose: 44 Gy
Plan Total Prescribed Dose: 44 Gy
Reference Point Dosage Given to Date: 17.6 Gy
Reference Point Dosage Given to Date: 17.6 Gy
Reference Point Session Dosage Given: 2.2 Gy
Reference Point Session Dosage Given: 2.2 Gy
Session Number: 9

## 2023-12-12 ENCOUNTER — Other Ambulatory Visit: Payer: Self-pay

## 2023-12-12 ENCOUNTER — Telehealth: Payer: Self-pay | Admitting: Family

## 2023-12-12 ENCOUNTER — Ambulatory Visit
Admission: RE | Admit: 2023-12-12 | Discharge: 2023-12-12 | Disposition: A | Discharge status: Home / Self Care | Payer: 59 | Source: Ambulatory Visit | Attending: Radiation Oncology | Admitting: Radiation Oncology

## 2023-12-12 DIAGNOSIS — C44310 Basal cell carcinoma of skin of unspecified parts of face: Secondary | ICD-10-CM | POA: Diagnosis not present

## 2023-12-12 DIAGNOSIS — Z510 Encounter for antineoplastic radiation therapy: Secondary | ICD-10-CM | POA: Diagnosis not present

## 2023-12-12 LAB — RAD ONC ARIA SESSION SUMMARY
Course Elapsed Days: 10
Plan Fractions Treated to Date: 9
Plan Fractions Treated to Date: 9
Plan Prescribed Dose Per Fraction: 2.2 Gy
Plan Prescribed Dose Per Fraction: 2.2 Gy
Plan Total Fractions Prescribed: 20
Plan Total Fractions Prescribed: 20
Plan Total Prescribed Dose: 44 Gy
Plan Total Prescribed Dose: 44 Gy
Reference Point Dosage Given to Date: 19.8 Gy
Reference Point Dosage Given to Date: 19.8 Gy
Reference Point Session Dosage Given: 2.2 Gy
Reference Point Session Dosage Given: 2.2 Gy
Session Number: 10

## 2023-12-12 NOTE — Telephone Encounter (Signed)
[name], LPN with [facility], left VM that patient fell yesterday trying to get her walker out of her car and caused a skin tear near her right knee. Is it okay to put Zeroform and wrap with Currellx?  Per [name] verbal this is okay.  Spoke with [name] and gave verbal.

## 2023-12-14 DIAGNOSIS — S81801A Unspecified open wound, right lower leg, initial encounter: Secondary | ICD-10-CM | POA: Diagnosis not present

## 2023-12-14 DIAGNOSIS — L89892 Pressure ulcer of other site, stage 2: Secondary | ICD-10-CM | POA: Diagnosis not present

## 2023-12-14 DIAGNOSIS — I87313 Chronic venous hypertension (idiopathic) with ulcer of bilateral lower extremity: Secondary | ICD-10-CM | POA: Diagnosis not present

## 2023-12-14 DIAGNOSIS — S91302A Unspecified open wound, left foot, initial encounter: Secondary | ICD-10-CM | POA: Diagnosis not present

## 2023-12-15 ENCOUNTER — Other Ambulatory Visit: Payer: Self-pay

## 2023-12-15 ENCOUNTER — Ambulatory Visit
Admission: RE | Admit: 2023-12-15 | Discharge: 2023-12-15 | Disposition: A | Discharge status: Home / Self Care | Payer: 59 | Source: Ambulatory Visit | Attending: Radiation Oncology | Admitting: Radiation Oncology

## 2023-12-15 DIAGNOSIS — C44319 Basal cell carcinoma of skin of other parts of face: Secondary | ICD-10-CM | POA: Diagnosis not present

## 2023-12-15 DIAGNOSIS — Z510 Encounter for antineoplastic radiation therapy: Secondary | ICD-10-CM | POA: Insufficient documentation

## 2023-12-15 DIAGNOSIS — C44310 Basal cell carcinoma of skin of unspecified parts of face: Secondary | ICD-10-CM | POA: Insufficient documentation

## 2023-12-15 LAB — RAD ONC ARIA SESSION SUMMARY
Course Elapsed Days: 13
Plan Fractions Treated to Date: 10
Plan Fractions Treated to Date: 10
Plan Prescribed Dose Per Fraction: 2.2 Gy
Plan Prescribed Dose Per Fraction: 2.2 Gy
Plan Total Fractions Prescribed: 20
Plan Total Fractions Prescribed: 20
Plan Total Prescribed Dose: 44 Gy
Plan Total Prescribed Dose: 44 Gy
Reference Point Dosage Given to Date: 22 Gy
Reference Point Dosage Given to Date: 22 Gy
Reference Point Session Dosage Given: 2.2 Gy
Reference Point Session Dosage Given: 2.2 Gy
Session Number: 11

## 2023-12-16 ENCOUNTER — Other Ambulatory Visit: Payer: Self-pay

## 2023-12-16 ENCOUNTER — Ambulatory Visit
Admission: RE | Admit: 2023-12-16 | Discharge: 2023-12-16 | Disposition: A | Discharge status: Home / Self Care | Payer: 59 | Source: Ambulatory Visit | Attending: Radiation Oncology | Admitting: Radiation Oncology

## 2023-12-16 DIAGNOSIS — D649 Anemia, unspecified: Secondary | ICD-10-CM | POA: Diagnosis not present

## 2023-12-16 DIAGNOSIS — L97812 Non-pressure chronic ulcer of other part of right lower leg with fat layer exposed: Secondary | ICD-10-CM | POA: Diagnosis not present

## 2023-12-16 DIAGNOSIS — I5022 Chronic systolic (congestive) heart failure: Secondary | ICD-10-CM | POA: Diagnosis not present

## 2023-12-16 DIAGNOSIS — I2510 Atherosclerotic heart disease of native coronary artery without angina pectoris: Secondary | ICD-10-CM | POA: Diagnosis not present

## 2023-12-16 DIAGNOSIS — E569 Vitamin deficiency, unspecified: Secondary | ICD-10-CM | POA: Diagnosis not present

## 2023-12-16 DIAGNOSIS — I872 Venous insufficiency (chronic) (peripheral): Secondary | ICD-10-CM | POA: Diagnosis not present

## 2023-12-16 DIAGNOSIS — I4891 Unspecified atrial fibrillation: Secondary | ICD-10-CM | POA: Diagnosis not present

## 2023-12-16 DIAGNOSIS — Z556 Problems related to health literacy: Secondary | ICD-10-CM | POA: Diagnosis not present

## 2023-12-16 DIAGNOSIS — Z9181 History of falling: Secondary | ICD-10-CM | POA: Diagnosis not present

## 2023-12-16 DIAGNOSIS — C44310 Basal cell carcinoma of skin of unspecified parts of face: Secondary | ICD-10-CM | POA: Diagnosis not present

## 2023-12-16 DIAGNOSIS — Z7982 Long term (current) use of aspirin: Secondary | ICD-10-CM | POA: Diagnosis not present

## 2023-12-16 DIAGNOSIS — L97422 Non-pressure chronic ulcer of left heel and midfoot with fat layer exposed: Secondary | ICD-10-CM | POA: Diagnosis not present

## 2023-12-16 DIAGNOSIS — I739 Peripheral vascular disease, unspecified: Secondary | ICD-10-CM | POA: Diagnosis not present

## 2023-12-16 DIAGNOSIS — I110 Hypertensive heart disease with heart failure: Secondary | ICD-10-CM | POA: Diagnosis not present

## 2023-12-16 DIAGNOSIS — Z7902 Long term (current) use of antithrombotics/antiplatelets: Secondary | ICD-10-CM | POA: Diagnosis not present

## 2023-12-16 DIAGNOSIS — Z510 Encounter for antineoplastic radiation therapy: Secondary | ICD-10-CM | POA: Diagnosis not present

## 2023-12-16 DIAGNOSIS — Z791 Long term (current) use of non-steroidal anti-inflammatories (NSAID): Secondary | ICD-10-CM | POA: Diagnosis not present

## 2023-12-16 DIAGNOSIS — Q7271 Split foot, right lower limb: Secondary | ICD-10-CM | POA: Diagnosis not present

## 2023-12-16 LAB — RAD ONC ARIA SESSION SUMMARY
Course Elapsed Days: 14
Plan Fractions Treated to Date: 11
Plan Fractions Treated to Date: 11
Plan Prescribed Dose Per Fraction: 2.2 Gy
Plan Prescribed Dose Per Fraction: 2.2 Gy
Plan Total Fractions Prescribed: 20
Plan Total Fractions Prescribed: 20
Plan Total Prescribed Dose: 44 Gy
Plan Total Prescribed Dose: 44 Gy
Reference Point Dosage Given to Date: 24.2 Gy
Reference Point Dosage Given to Date: 24.2 Gy
Reference Point Session Dosage Given: 2.2 Gy
Reference Point Session Dosage Given: 2.2 Gy
Session Number: 12

## 2023-12-17 ENCOUNTER — Ambulatory Visit
Admission: RE | Admit: 2023-12-17 | Discharge: 2023-12-17 | Disposition: A | Discharge status: Home / Self Care | Payer: 59 | Source: Ambulatory Visit | Attending: Radiation Oncology | Admitting: Radiation Oncology

## 2023-12-17 ENCOUNTER — Other Ambulatory Visit: Payer: Self-pay

## 2023-12-17 DIAGNOSIS — C44310 Basal cell carcinoma of skin of unspecified parts of face: Secondary | ICD-10-CM | POA: Diagnosis not present

## 2023-12-17 DIAGNOSIS — Z510 Encounter for antineoplastic radiation therapy: Secondary | ICD-10-CM | POA: Diagnosis not present

## 2023-12-17 LAB — RAD ONC ARIA SESSION SUMMARY
Course Elapsed Days: 15
Plan Fractions Treated to Date: 12
Plan Fractions Treated to Date: 12
Plan Prescribed Dose Per Fraction: 2.2 Gy
Plan Prescribed Dose Per Fraction: 2.2 Gy
Plan Total Fractions Prescribed: 20
Plan Total Fractions Prescribed: 20
Plan Total Prescribed Dose: 44 Gy
Plan Total Prescribed Dose: 44 Gy
Reference Point Dosage Given to Date: 26.4 Gy
Reference Point Dosage Given to Date: 26.4 Gy
Reference Point Session Dosage Given: 2.2 Gy
Reference Point Session Dosage Given: 2.2 Gy
Session Number: 13

## 2023-12-18 ENCOUNTER — Other Ambulatory Visit: Payer: Self-pay

## 2023-12-18 ENCOUNTER — Ambulatory Visit
Admission: RE | Admit: 2023-12-18 | Discharge: 2023-12-18 | Disposition: A | Discharge status: Home / Self Care | Payer: 59 | Source: Ambulatory Visit | Attending: Radiation Oncology | Admitting: Radiation Oncology

## 2023-12-18 DIAGNOSIS — E569 Vitamin deficiency, unspecified: Secondary | ICD-10-CM | POA: Diagnosis not present

## 2023-12-18 DIAGNOSIS — I110 Hypertensive heart disease with heart failure: Secondary | ICD-10-CM | POA: Diagnosis not present

## 2023-12-18 DIAGNOSIS — Z791 Long term (current) use of non-steroidal anti-inflammatories (NSAID): Secondary | ICD-10-CM | POA: Diagnosis not present

## 2023-12-18 DIAGNOSIS — L97812 Non-pressure chronic ulcer of other part of right lower leg with fat layer exposed: Secondary | ICD-10-CM | POA: Diagnosis not present

## 2023-12-18 DIAGNOSIS — I739 Peripheral vascular disease, unspecified: Secondary | ICD-10-CM | POA: Diagnosis not present

## 2023-12-18 DIAGNOSIS — Z7982 Long term (current) use of aspirin: Secondary | ICD-10-CM | POA: Diagnosis not present

## 2023-12-18 DIAGNOSIS — C44310 Basal cell carcinoma of skin of unspecified parts of face: Secondary | ICD-10-CM | POA: Diagnosis not present

## 2023-12-18 DIAGNOSIS — Z510 Encounter for antineoplastic radiation therapy: Secondary | ICD-10-CM | POA: Diagnosis not present

## 2023-12-18 DIAGNOSIS — I2510 Atherosclerotic heart disease of native coronary artery without angina pectoris: Secondary | ICD-10-CM | POA: Diagnosis not present

## 2023-12-18 DIAGNOSIS — I4891 Unspecified atrial fibrillation: Secondary | ICD-10-CM | POA: Diagnosis not present

## 2023-12-18 DIAGNOSIS — Z9181 History of falling: Secondary | ICD-10-CM | POA: Diagnosis not present

## 2023-12-18 DIAGNOSIS — I872 Venous insufficiency (chronic) (peripheral): Secondary | ICD-10-CM | POA: Diagnosis not present

## 2023-12-18 DIAGNOSIS — Z556 Problems related to health literacy: Secondary | ICD-10-CM | POA: Diagnosis not present

## 2023-12-18 DIAGNOSIS — Q7271 Split foot, right lower limb: Secondary | ICD-10-CM | POA: Diagnosis not present

## 2023-12-18 DIAGNOSIS — D649 Anemia, unspecified: Secondary | ICD-10-CM | POA: Diagnosis not present

## 2023-12-18 DIAGNOSIS — L97422 Non-pressure chronic ulcer of left heel and midfoot with fat layer exposed: Secondary | ICD-10-CM | POA: Diagnosis not present

## 2023-12-18 DIAGNOSIS — I5022 Chronic systolic (congestive) heart failure: Secondary | ICD-10-CM | POA: Diagnosis not present

## 2023-12-18 DIAGNOSIS — Z7902 Long term (current) use of antithrombotics/antiplatelets: Secondary | ICD-10-CM | POA: Diagnosis not present

## 2023-12-18 LAB — RAD ONC ARIA SESSION SUMMARY
Course Elapsed Days: 16
Plan Fractions Treated to Date: 13
Plan Fractions Treated to Date: 13
Plan Prescribed Dose Per Fraction: 2.2 Gy
Plan Prescribed Dose Per Fraction: 2.2 Gy
Plan Total Fractions Prescribed: 20
Plan Total Fractions Prescribed: 20
Plan Total Prescribed Dose: 44 Gy
Plan Total Prescribed Dose: 44 Gy
Reference Point Dosage Given to Date: 28.6 Gy
Reference Point Dosage Given to Date: 28.6 Gy
Reference Point Session Dosage Given: 2.2 Gy
Reference Point Session Dosage Given: 2.2 Gy
Session Number: 14

## 2023-12-19 ENCOUNTER — Other Ambulatory Visit: Payer: Self-pay

## 2023-12-19 ENCOUNTER — Telehealth: Payer: Self-pay | Admitting: Family

## 2023-12-19 ENCOUNTER — Ambulatory Visit
Admission: RE | Admit: 2023-12-19 | Discharge: 2023-12-19 | Disposition: A | Discharge status: Home / Self Care | Payer: 59 | Source: Ambulatory Visit | Attending: Radiation Oncology | Admitting: Radiation Oncology

## 2023-12-19 DIAGNOSIS — Z510 Encounter for antineoplastic radiation therapy: Secondary | ICD-10-CM | POA: Diagnosis not present

## 2023-12-19 DIAGNOSIS — C44310 Basal cell carcinoma of skin of unspecified parts of face: Secondary | ICD-10-CM | POA: Diagnosis not present

## 2023-12-19 LAB — RAD ONC ARIA SESSION SUMMARY
Course Elapsed Days: 17
Plan Fractions Treated to Date: 14
Plan Fractions Treated to Date: 14
Plan Prescribed Dose Per Fraction: 2.2 Gy
Plan Prescribed Dose Per Fraction: 2.2 Gy
Plan Total Fractions Prescribed: 20
Plan Total Fractions Prescribed: 20
Plan Total Prescribed Dose: 44 Gy
Plan Total Prescribed Dose: 44 Gy
Reference Point Dosage Given to Date: 30.8 Gy
Reference Point Dosage Given to Date: 30.8 Gy
Reference Point Session Dosage Given: 2.2 Gy
Reference Point Session Dosage Given: 2.2 Gy
Session Number: 15

## 2023-12-19 NOTE — Telephone Encounter (Signed)
 Pt sister is caretaker and she said pt is undergoing cancer treatments daily and won't be able to schedule anything until treatmants are done and will cb to r/s

## 2023-12-22 ENCOUNTER — Ambulatory Visit
Admission: RE | Admit: 2023-12-22 | Discharge: 2023-12-22 | Disposition: A | Discharge status: Home / Self Care | Payer: 59 | Source: Ambulatory Visit | Attending: Radiation Oncology | Admitting: Radiation Oncology

## 2023-12-22 ENCOUNTER — Other Ambulatory Visit: Payer: Self-pay

## 2023-12-22 DIAGNOSIS — C44310 Basal cell carcinoma of skin of unspecified parts of face: Secondary | ICD-10-CM | POA: Diagnosis not present

## 2023-12-22 DIAGNOSIS — C44319 Basal cell carcinoma of skin of other parts of face: Secondary | ICD-10-CM | POA: Diagnosis not present

## 2023-12-22 DIAGNOSIS — Z510 Encounter for antineoplastic radiation therapy: Secondary | ICD-10-CM | POA: Diagnosis not present

## 2023-12-22 LAB — RAD ONC ARIA SESSION SUMMARY
Course Elapsed Days: 20
Plan Fractions Treated to Date: 15
Plan Fractions Treated to Date: 15
Plan Prescribed Dose Per Fraction: 2.2 Gy
Plan Prescribed Dose Per Fraction: 2.2 Gy
Plan Total Fractions Prescribed: 20
Plan Total Fractions Prescribed: 20
Plan Total Prescribed Dose: 44 Gy
Plan Total Prescribed Dose: 44 Gy
Reference Point Dosage Given to Date: 33 Gy
Reference Point Dosage Given to Date: 33 Gy
Reference Point Session Dosage Given: 2.2 Gy
Reference Point Session Dosage Given: 2.2 Gy
Session Number: 16

## 2023-12-23 ENCOUNTER — Ambulatory Visit
Admission: RE | Admit: 2023-12-23 | Discharge: 2023-12-23 | Disposition: A | Discharge status: Home / Self Care | Payer: 59 | Source: Ambulatory Visit | Attending: Radiation Oncology | Admitting: Radiation Oncology

## 2023-12-23 ENCOUNTER — Other Ambulatory Visit: Payer: Self-pay

## 2023-12-23 DIAGNOSIS — I4891 Unspecified atrial fibrillation: Secondary | ICD-10-CM | POA: Diagnosis not present

## 2023-12-23 DIAGNOSIS — Z510 Encounter for antineoplastic radiation therapy: Secondary | ICD-10-CM | POA: Diagnosis not present

## 2023-12-23 DIAGNOSIS — E569 Vitamin deficiency, unspecified: Secondary | ICD-10-CM | POA: Diagnosis not present

## 2023-12-23 DIAGNOSIS — I872 Venous insufficiency (chronic) (peripheral): Secondary | ICD-10-CM | POA: Diagnosis not present

## 2023-12-23 DIAGNOSIS — D649 Anemia, unspecified: Secondary | ICD-10-CM | POA: Diagnosis not present

## 2023-12-23 DIAGNOSIS — I739 Peripheral vascular disease, unspecified: Secondary | ICD-10-CM | POA: Diagnosis not present

## 2023-12-23 DIAGNOSIS — I2510 Atherosclerotic heart disease of native coronary artery without angina pectoris: Secondary | ICD-10-CM | POA: Diagnosis not present

## 2023-12-23 DIAGNOSIS — Z7982 Long term (current) use of aspirin: Secondary | ICD-10-CM | POA: Diagnosis not present

## 2023-12-23 DIAGNOSIS — C44310 Basal cell carcinoma of skin of unspecified parts of face: Secondary | ICD-10-CM | POA: Diagnosis not present

## 2023-12-23 DIAGNOSIS — I110 Hypertensive heart disease with heart failure: Secondary | ICD-10-CM | POA: Diagnosis not present

## 2023-12-23 DIAGNOSIS — Z7902 Long term (current) use of antithrombotics/antiplatelets: Secondary | ICD-10-CM | POA: Diagnosis not present

## 2023-12-23 DIAGNOSIS — Z791 Long term (current) use of non-steroidal anti-inflammatories (NSAID): Secondary | ICD-10-CM | POA: Diagnosis not present

## 2023-12-23 DIAGNOSIS — Z9181 History of falling: Secondary | ICD-10-CM | POA: Diagnosis not present

## 2023-12-23 DIAGNOSIS — Q7271 Split foot, right lower limb: Secondary | ICD-10-CM | POA: Diagnosis not present

## 2023-12-23 DIAGNOSIS — Z556 Problems related to health literacy: Secondary | ICD-10-CM | POA: Diagnosis not present

## 2023-12-23 DIAGNOSIS — L97812 Non-pressure chronic ulcer of other part of right lower leg with fat layer exposed: Secondary | ICD-10-CM | POA: Diagnosis not present

## 2023-12-23 DIAGNOSIS — I5022 Chronic systolic (congestive) heart failure: Secondary | ICD-10-CM | POA: Diagnosis not present

## 2023-12-23 DIAGNOSIS — L97422 Non-pressure chronic ulcer of left heel and midfoot with fat layer exposed: Secondary | ICD-10-CM | POA: Diagnosis not present

## 2023-12-23 LAB — RAD ONC ARIA SESSION SUMMARY
Course Elapsed Days: 21
Plan Fractions Treated to Date: 16
Plan Fractions Treated to Date: 16
Plan Prescribed Dose Per Fraction: 2.2 Gy
Plan Prescribed Dose Per Fraction: 2.2 Gy
Plan Total Fractions Prescribed: 20
Plan Total Fractions Prescribed: 20
Plan Total Prescribed Dose: 44 Gy
Plan Total Prescribed Dose: 44 Gy
Reference Point Dosage Given to Date: 35.2 Gy
Reference Point Dosage Given to Date: 35.2 Gy
Reference Point Session Dosage Given: 2.2 Gy
Reference Point Session Dosage Given: 2.2 Gy
Session Number: 17

## 2023-12-24 ENCOUNTER — Ambulatory Visit
Admission: RE | Admit: 2023-12-24 | Discharge: 2023-12-24 | Disposition: A | Discharge status: Home / Self Care | Payer: 59 | Source: Ambulatory Visit | Attending: Radiation Oncology | Admitting: Radiation Oncology

## 2023-12-24 ENCOUNTER — Other Ambulatory Visit: Payer: Self-pay

## 2023-12-24 DIAGNOSIS — Z510 Encounter for antineoplastic radiation therapy: Secondary | ICD-10-CM | POA: Diagnosis not present

## 2023-12-24 DIAGNOSIS — C44310 Basal cell carcinoma of skin of unspecified parts of face: Secondary | ICD-10-CM | POA: Diagnosis not present

## 2023-12-24 LAB — RAD ONC ARIA SESSION SUMMARY
Course Elapsed Days: 22
Plan Fractions Treated to Date: 17
Plan Fractions Treated to Date: 17
Plan Prescribed Dose Per Fraction: 2.2 Gy
Plan Prescribed Dose Per Fraction: 2.2 Gy
Plan Total Fractions Prescribed: 20
Plan Total Fractions Prescribed: 20
Plan Total Prescribed Dose: 44 Gy
Plan Total Prescribed Dose: 44 Gy
Reference Point Dosage Given to Date: 37.4 Gy
Reference Point Dosage Given to Date: 37.4 Gy
Reference Point Session Dosage Given: 2.2 Gy
Reference Point Session Dosage Given: 2.2 Gy
Session Number: 18

## 2023-12-25 ENCOUNTER — Encounter: Payer: Self-pay | Admitting: Oncology

## 2023-12-25 ENCOUNTER — Other Ambulatory Visit: Payer: Self-pay

## 2023-12-25 ENCOUNTER — Inpatient Hospital Stay (HOSPITAL_BASED_OUTPATIENT_CLINIC_OR_DEPARTMENT_OTHER): Payer: 59 | Admitting: Oncology

## 2023-12-25 ENCOUNTER — Ambulatory Visit
Admission: RE | Admit: 2023-12-25 | Discharge: 2023-12-25 | Disposition: A | Discharge status: Home / Self Care | Payer: 59 | Source: Ambulatory Visit | Attending: Radiation Oncology | Admitting: Radiation Oncology

## 2023-12-25 VITALS — BP 118/60 | HR 56 | Temp 96.6°F | Resp 20 | Wt 97.9 lb

## 2023-12-25 DIAGNOSIS — I110 Hypertensive heart disease with heart failure: Secondary | ICD-10-CM | POA: Diagnosis not present

## 2023-12-25 DIAGNOSIS — Z9181 History of falling: Secondary | ICD-10-CM | POA: Diagnosis not present

## 2023-12-25 DIAGNOSIS — I10 Essential (primary) hypertension: Secondary | ICD-10-CM | POA: Insufficient documentation

## 2023-12-25 DIAGNOSIS — Z87891 Personal history of nicotine dependence: Secondary | ICD-10-CM | POA: Insufficient documentation

## 2023-12-25 DIAGNOSIS — Q7271 Split foot, right lower limb: Secondary | ICD-10-CM | POA: Diagnosis not present

## 2023-12-25 DIAGNOSIS — D649 Anemia, unspecified: Secondary | ICD-10-CM | POA: Diagnosis not present

## 2023-12-25 DIAGNOSIS — Z791 Long term (current) use of non-steroidal anti-inflammatories (NSAID): Secondary | ICD-10-CM | POA: Diagnosis not present

## 2023-12-25 DIAGNOSIS — L97909 Non-pressure chronic ulcer of unspecified part of unspecified lower leg with unspecified severity: Secondary | ICD-10-CM | POA: Insufficient documentation

## 2023-12-25 DIAGNOSIS — C44310 Basal cell carcinoma of skin of unspecified parts of face: Secondary | ICD-10-CM

## 2023-12-25 DIAGNOSIS — I4891 Unspecified atrial fibrillation: Secondary | ICD-10-CM | POA: Diagnosis not present

## 2023-12-25 DIAGNOSIS — I5022 Chronic systolic (congestive) heart failure: Secondary | ICD-10-CM | POA: Diagnosis not present

## 2023-12-25 DIAGNOSIS — L97812 Non-pressure chronic ulcer of other part of right lower leg with fat layer exposed: Secondary | ICD-10-CM | POA: Diagnosis not present

## 2023-12-25 DIAGNOSIS — Z510 Encounter for antineoplastic radiation therapy: Secondary | ICD-10-CM | POA: Diagnosis not present

## 2023-12-25 DIAGNOSIS — I872 Venous insufficiency (chronic) (peripheral): Secondary | ICD-10-CM | POA: Diagnosis not present

## 2023-12-25 DIAGNOSIS — I739 Peripheral vascular disease, unspecified: Secondary | ICD-10-CM | POA: Diagnosis not present

## 2023-12-25 DIAGNOSIS — Z7982 Long term (current) use of aspirin: Secondary | ICD-10-CM | POA: Diagnosis not present

## 2023-12-25 DIAGNOSIS — Z803 Family history of malignant neoplasm of breast: Secondary | ICD-10-CM | POA: Insufficient documentation

## 2023-12-25 DIAGNOSIS — I2510 Atherosclerotic heart disease of native coronary artery without angina pectoris: Secondary | ICD-10-CM | POA: Diagnosis not present

## 2023-12-25 DIAGNOSIS — C44319 Basal cell carcinoma of skin of other parts of face: Secondary | ICD-10-CM | POA: Insufficient documentation

## 2023-12-25 DIAGNOSIS — E569 Vitamin deficiency, unspecified: Secondary | ICD-10-CM | POA: Diagnosis not present

## 2023-12-25 DIAGNOSIS — Z7902 Long term (current) use of antithrombotics/antiplatelets: Secondary | ICD-10-CM | POA: Diagnosis not present

## 2023-12-25 DIAGNOSIS — L97422 Non-pressure chronic ulcer of left heel and midfoot with fat layer exposed: Secondary | ICD-10-CM | POA: Diagnosis not present

## 2023-12-25 DIAGNOSIS — Z556 Problems related to health literacy: Secondary | ICD-10-CM | POA: Diagnosis not present

## 2023-12-25 LAB — RAD ONC ARIA SESSION SUMMARY
Course Elapsed Days: 23
Plan Fractions Treated to Date: 18
Plan Fractions Treated to Date: 18
Plan Prescribed Dose Per Fraction: 2.2 Gy
Plan Prescribed Dose Per Fraction: 2.2 Gy
Plan Total Fractions Prescribed: 20
Plan Total Fractions Prescribed: 20
Plan Total Prescribed Dose: 44 Gy
Plan Total Prescribed Dose: 44 Gy
Reference Point Dosage Given to Date: 39.6 Gy
Reference Point Dosage Given to Date: 39.6 Gy
Reference Point Session Dosage Given: 2.2 Gy
Reference Point Session Dosage Given: 2.2 Gy
Session Number: 19

## 2023-12-25 NOTE — Progress Notes (Signed)
[facility]  [phone] [phone]  ID: [name] OB: 1935-07-30  MR#: [id number]  GMW#:[id number]  Patient Care Team: Miki Kins, FNP as PCP - General (Family Medicine) Iran Ouch, MD as PCP - Cardiology (Cardiology) Carmina Miller, MD as Consulting Physician (Radiation Oncology)  CHIEF COMPLAINT: Basal cell carcinoma.  INTERVAL HISTORY: Patient returns to clinic today for further evaluation and follow-up near the conclusion of her XRT.  She is tolerating her treatment well without significant side effects.  She currently feels well and is at her baseline.  She has no neurologic complaints.  She denies any recent fevers or illnesses.  She has a good appetite and denies weight loss.  She has no chest pain, shortness of breath, cough, or hemoptysis.  She denies any nausea, vomiting, constipation, or diarrhea.  She has no urinary complaints.  Patient offers no specific complaints today.  REVIEW OF SYSTEMS:   Review of Systems  Constitutional: Negative.  Negative for fever, malaise/fatigue and weight loss.  Respiratory: Negative.  Negative for cough and shortness of breath.   Cardiovascular: Negative.  Negative for chest pain and leg swelling.  Gastrointestinal: Negative.  Negative for abdominal pain.  Genitourinary: Negative.  Negative for dysuria.  Musculoskeletal: Negative.  Negative for back pain.  Skin: Negative.  Negative for rash.  Neurological: Negative.  Negative for dizziness, focal weakness, weakness and headaches.  Psychiatric/Behavioral: Negative.  The patient is not nervous/anxious.     As per HPI. Otherwise, a complete review of systems is negative.  PAST MEDICAL HISTORY: Past Medical History:  Diagnosis Date   AKI (acute kidney injury) (HCC) 02/20/2023   Atrial fibrillation (HCC)    Basal cell carcinoma 2021   L temple   Chronic back pain    Congestive heart failure (CHF) (HCC)    History of total hip  replacement (Bilateral) 01/12/2020   Left hip replacement done in 2019.  Right hip replacement done in 2012.   Hypertension    Hyponatremia 06/11/2020   Melena 02/21/2023   NSTEMI (non-ST elevated myocardial infarction) (HCC) 06/11/2020   Pharmacologic therapy 01/12/2020   Problems influencing health status 01/12/2020   Scoliosis    Upper GI bleeding 02/21/2023    PAST SURGICAL HISTORY: Past Surgical History:  Procedure Laterality Date   ABDOMINAL SURGERY     bilateral hip replacement     CORONARY STENT INTERVENTION N/A 06/12/2020   Procedure: CORONARY STENT INTERVENTION;  Surgeon: Iran Ouch, MD;  Location: [facility] INVASIVE CV LAB;  Service: Cardiovascular;  Laterality: N/A;  LAD   ESOPHAGOGASTRODUODENOSCOPY (EGD) WITH PROPOFOL N/A 02/24/2023   Procedure: ESOPHAGOGASTRODUODENOSCOPY (EGD) WITH PROPOFOL;  Surgeon: Midge Minium, MD;  Location: [facility] ENDOSCOPY;  Service: Endoscopy;  Laterality: N/A;   LEFT HEART CATH AND CORONARY ANGIOGRAPHY N/A 06/12/2020   Procedure: LEFT HEART CATH AND CORONARY ANGIOGRAPHY;  Surgeon: Iran Ouch, MD;  Location: [facility] INVASIVE CV LAB;  Service: Cardiovascular;  Laterality: N/A;   LOWER EXTREMITY ANGIOGRAPHY Right 02/25/2023   Procedure: Lower Extremity Angiography;  Surgeon: Renford Dills, MD;  Location: [facility] INVASIVE CV LAB;  Service: Cardiovascular;  Laterality: Right;   TONSILLECTOMY      FAMILY HISTORY: Family History  Problem Relation Age of Onset   Breast cancer Mother    CVA Father    Heart disease Father    Arthritis/Rheumatoid Sister    Breast cancer Paternal Grandmother     ADVANCED DIRECTIVES (Y/N):  N  HEALTH MAINTENANCE: Social History   Tobacco  Use   Smoking status: Former    Current packs/day: 0.00    Average packs/day: 0.5 packs/day for 68.0 years (34.0 ttl pk-yrs)    Types: Cigarettes    Start date: 06/11/1952    Quit date: 06/11/2020    Years since quitting: 3.5   Smokeless tobacco: Never  Substance Use Topics    Alcohol use: Not Currently   Drug use: Not Currently     Colonoscopy:  PAP:  Bone density:  Lipid panel:  Allergies  Allergen Reactions   Spironolactone Anaphylaxis   Fluogen [Influenza Virus Vaccine] Hives   Statins     Dizziness & weakness   Sulfa Antibiotics Other (See Comments)    Unknown, happened as a child    Current Outpatient Medications  Medication Sig Dispense Refill   aspirin EC 81 MG tablet Take 1 tablet (81 mg total) by mouth daily. Swallow whole. 30 tablet 0   clopidogrel (PLAVIX) 75 MG tablet TAKE 1 TABLET BY MOUTH ONCE DAILY 30 tablet 8   erythromycin ophthalmic ointment Place 3.5 g into both eyes as needed.     feeding supplement (ENSURE ENLIVE / ENSURE PLUS) LIQD Take 237 mLs by mouth 2 (two) times daily between meals. 237 mL 12   Ferrous Sulfate (IRON) 325 (65 Fe) MG TABS TAKE 1 TABLET BY MOUTH ONCE DAILY 30 tablet 8   gabapentin (NEURONTIN) 400 MG capsule TAKE 1 CAPSULE BY MOUTH FOUR TIMES DAILY 120 capsule 8   lisinopril (ZESTRIL) 5 MG tablet Take 5 mg by mouth daily.     meloxicam (MOBIC) 15 MG tablet TAKE 1 TABLET BY MOUTH ONCE DAILY 30 tablet 8   metoprolol succinate (TOPROL-XL) 25 MG 24 hr tablet TAKE 1 TABLET BY MOUTH ONCE DAILY 30 tablet 8   oxyCODONE-acetaminophen (PERCOCET) 10-325 MG tablet Take 1 tablet by mouth every 6 (six) hours as needed. 120 tablet 0   terbinafine (LAMISIL) 250 MG tablet Take 250 mg by mouth daily.     valsartan (DIOVAN) 40 MG tablet Take 1 tablet (40 mg total) by mouth 2 (two) times daily. 60 tablet 1   No current facility-administered medications for this visit.    OBJECTIVE: Vitals:   12/25/23 1020  BP: 118/60  Pulse: (!) 56  Resp: 20  Temp: (!) 96.6 F (35.9 C)  SpO2: 100%     Body mass index is 15.8 kg/m.    ECOG FS:0 - Asymptomatic  General: Well-developed, well-nourished, no acute distress. Eyes: Pink conjunctiva, anicteric sclera. HEENT: Normocephalic, moist mucous membranes. Lungs: No audible wheezing  or coughing. Heart: Regular rate and rhythm. Abdomen: Soft, nontender, no obvious distention. Musculoskeletal: No edema, cyanosis, or clubbing. Neuro: Alert, answering all questions appropriately. Cranial nerves grossly intact. Skin: No rashes or petechiae noted.  Basal cell carcinoma on bridge of nose and right temple nearly resolved. Psych: Normal affect.  LAB RESULTS:  Lab Results  Component Value Date   NA 136 10/01/2023   K 4.7 10/01/2023   CL 97 10/01/2023   CO2 26 10/01/2023   GLUCOSE 79 10/01/2023   BUN 15 10/01/2023   CREATININE 1.02 (H) 10/01/2023   CALCIUM 9.0 10/01/2023   PROT 6.9 10/01/2023   ALBUMIN 4.3 10/01/2023   AST 30 10/01/2023   ALT 22 10/01/2023   ALKPHOS 127 (H) 10/01/2023   BILITOT 0.3 10/01/2023   GFRNONAA >60 02/28/2023   GFRAA >60 06/14/2020    Lab Results  Component Value Date   WBC 7.5 10/01/2023   NEUTROABS 6.1 10/01/2023  HGB 10.6 (L) 10/01/2023   HCT 33.8 (L) 10/01/2023   MCV 93 10/01/2023   PLT 313 10/01/2023     STUDIES: No results found.  ASSESSMENT: Basal cell carcinoma.  PLAN:    Basal cell carcinoma: Lesions on patient's face and not surgically amenable.  We discussed the possibility of systemic treatment either with a hedgehog inhibitor or immunotherapy, neither of which patient is interested in at this time.  Hedgehog inhibitor may be too toxic given her age and underlying cardiac disease.  While immunotherapy would have minimal side effects, patient is not interested in IV treatment for 1 to 2 years.  Patient will complete radiation treatments later this week.  After lengthy discussion, patient agreed to continue close follow-up with her dermatologist and primary care.  If she has a recurrence of disease, she would consider immunotherapy at that time.  No follow-up has been scheduled with medical oncology.   Hypertension: Patient's blood pressure is within normal limits today.  Continue monitoring and treatment per primary  care. Lower extremity ulcers: Secondary to poor circulation.  Continue follow-up with vascular surgery as scheduled.      Patient expressed understanding and was in agreement with this plan. She also understands that She can call clinic at any time with any questions, concerns, or complaints.    Cancer Staging  No matching staging information was found for the patient.   Jeralyn Ruths, MD   12/25/2023 11:16 AM

## 2023-12-26 ENCOUNTER — Ambulatory Visit
Admission: RE | Admit: 2023-12-26 | Discharge: 2023-12-26 | Disposition: A | Discharge status: Home / Self Care | Payer: 59 | Source: Ambulatory Visit | Attending: Radiation Oncology | Admitting: Radiation Oncology

## 2023-12-26 ENCOUNTER — Other Ambulatory Visit: Payer: Self-pay

## 2023-12-26 DIAGNOSIS — C44310 Basal cell carcinoma of skin of unspecified parts of face: Secondary | ICD-10-CM | POA: Diagnosis not present

## 2023-12-26 DIAGNOSIS — Z510 Encounter for antineoplastic radiation therapy: Secondary | ICD-10-CM | POA: Diagnosis not present

## 2023-12-26 LAB — RAD ONC ARIA SESSION SUMMARY
Course Elapsed Days: 24
Plan Fractions Treated to Date: 19
Plan Fractions Treated to Date: 19
Plan Prescribed Dose Per Fraction: 2.2 Gy
Plan Prescribed Dose Per Fraction: 2.2 Gy
Plan Total Fractions Prescribed: 20
Plan Total Fractions Prescribed: 20
Plan Total Prescribed Dose: 44 Gy
Plan Total Prescribed Dose: 44 Gy
Reference Point Dosage Given to Date: 41.8 Gy
Reference Point Dosage Given to Date: 41.8 Gy
Reference Point Session Dosage Given: 2.2 Gy
Reference Point Session Dosage Given: 2.2 Gy
Session Number: 20

## 2023-12-29 ENCOUNTER — Ambulatory Visit
Admission: RE | Admit: 2023-12-29 | Discharge: 2023-12-29 | Disposition: A | Discharge status: Home / Self Care | Payer: 59 | Source: Ambulatory Visit | Attending: Radiation Oncology | Admitting: Radiation Oncology

## 2023-12-29 ENCOUNTER — Other Ambulatory Visit: Payer: Self-pay

## 2023-12-29 DIAGNOSIS — D649 Anemia, unspecified: Secondary | ICD-10-CM | POA: Diagnosis not present

## 2023-12-29 DIAGNOSIS — E569 Vitamin deficiency, unspecified: Secondary | ICD-10-CM | POA: Diagnosis not present

## 2023-12-29 DIAGNOSIS — I739 Peripheral vascular disease, unspecified: Secondary | ICD-10-CM | POA: Diagnosis not present

## 2023-12-29 DIAGNOSIS — Z791 Long term (current) use of non-steroidal anti-inflammatories (NSAID): Secondary | ICD-10-CM | POA: Diagnosis not present

## 2023-12-29 DIAGNOSIS — L97422 Non-pressure chronic ulcer of left heel and midfoot with fat layer exposed: Secondary | ICD-10-CM | POA: Diagnosis not present

## 2023-12-29 DIAGNOSIS — C44310 Basal cell carcinoma of skin of unspecified parts of face: Secondary | ICD-10-CM | POA: Diagnosis not present

## 2023-12-29 DIAGNOSIS — I4891 Unspecified atrial fibrillation: Secondary | ICD-10-CM | POA: Diagnosis not present

## 2023-12-29 DIAGNOSIS — Z9181 History of falling: Secondary | ICD-10-CM | POA: Diagnosis not present

## 2023-12-29 DIAGNOSIS — I2510 Atherosclerotic heart disease of native coronary artery without angina pectoris: Secondary | ICD-10-CM | POA: Diagnosis not present

## 2023-12-29 DIAGNOSIS — Z7982 Long term (current) use of aspirin: Secondary | ICD-10-CM | POA: Diagnosis not present

## 2023-12-29 DIAGNOSIS — L97812 Non-pressure chronic ulcer of other part of right lower leg with fat layer exposed: Secondary | ICD-10-CM | POA: Diagnosis not present

## 2023-12-29 DIAGNOSIS — C44319 Basal cell carcinoma of skin of other parts of face: Secondary | ICD-10-CM | POA: Diagnosis not present

## 2023-12-29 DIAGNOSIS — I872 Venous insufficiency (chronic) (peripheral): Secondary | ICD-10-CM | POA: Diagnosis not present

## 2023-12-29 DIAGNOSIS — Z510 Encounter for antineoplastic radiation therapy: Secondary | ICD-10-CM | POA: Diagnosis not present

## 2023-12-29 DIAGNOSIS — Z556 Problems related to health literacy: Secondary | ICD-10-CM | POA: Diagnosis not present

## 2023-12-29 DIAGNOSIS — Z7902 Long term (current) use of antithrombotics/antiplatelets: Secondary | ICD-10-CM | POA: Diagnosis not present

## 2023-12-29 DIAGNOSIS — Q7271 Split foot, right lower limb: Secondary | ICD-10-CM | POA: Diagnosis not present

## 2023-12-29 DIAGNOSIS — I5022 Chronic systolic (congestive) heart failure: Secondary | ICD-10-CM | POA: Diagnosis not present

## 2023-12-29 DIAGNOSIS — I110 Hypertensive heart disease with heart failure: Secondary | ICD-10-CM | POA: Diagnosis not present

## 2023-12-29 LAB — RAD ONC ARIA SESSION SUMMARY
Course Elapsed Days: 27
Plan Fractions Treated to Date: 20
Plan Fractions Treated to Date: 20
Plan Prescribed Dose Per Fraction: 2.2 Gy
Plan Prescribed Dose Per Fraction: 2.2 Gy
Plan Total Fractions Prescribed: 20
Plan Total Fractions Prescribed: 20
Plan Total Prescribed Dose: 44 Gy
Plan Total Prescribed Dose: 44 Gy
Reference Point Dosage Given to Date: 44 Gy
Reference Point Dosage Given to Date: 44 Gy
Reference Point Session Dosage Given: 2.2 Gy
Reference Point Session Dosage Given: 2.2 Gy
Session Number: 21

## 2023-12-30 ENCOUNTER — Ambulatory Visit: Payer: 59 | Admitting: Physician Assistant

## 2023-12-30 NOTE — Radiation Completion Notes (Signed)
Patient Name: [name] [name], [name] [name] MRN: [id number] Date of Birth: 1934-12-28 Referring Physician: Grayling Congress, M.D. Date of Service: 2023-12-30 Radiation Oncologist: Carmina Miller, M.D. [facility]                             RADIATION ONCOLOGY END OF TREATMENT NOTE     Diagnosis: C44.319 Basal cell carcinoma of skin of other parts of face Intent: Curative     HPI: Patient is an 89 year old female diagnosed with a basal cell carcinoma of the right bridge of her nose as well as right face.  She has been seen by medical oncology now referred to radiation oncology for consideration of electron-beam therapy.  Based on her age and comorbidities she is a nonsurgical candidate.  She is basically asymptomatic.      ==========DELIVERED PLANS==========  First Treatment Date: 2023-12-02 Last Treatment Date: 2023-12-29   Plan Name: HN_R_Nose_BO Site: Nose Technique: Electron Mode: Electron Dose Per Fraction: 2.2 Gy Prescribed Dose (Delivered / Prescribed): 44 Gy / 44 Gy Prescribed Fxs (Delivered / Prescribed): 20 / 20   Plan Name: HN_R_Cheek_BO Site: Cheek, Right Technique: Electron Mode: Electron Dose Per Fraction: 2.2 Gy Prescribed Dose (Delivered / Prescribed): 44 Gy / 44 Gy Prescribed Fxs (Delivered / Prescribed): 20 / 20     ==========ON TREATMENT VISIT DATES========== 2023-12-02, 2023-12-09, 2023-12-16, 2023-12-23     ==========UPCOMING VISITS========== 02/13/2024 [facility] EST PT 15 Gala Lew[address] [place], [place]  02/02/2024 [facility] RAD ONCOLOGY FOLLOW UP 30 Carmina Miller, MD  12/31/2023 [facility] OFFICE VISIT Miki Kins, FNP        ==========APPENDIX - ON TREATMENT VISIT NOTES==========   See weekly On Treatment Notes in Epic for details in the Media tab (listed as Progress notes on the On Treatment Visit Dates listed above).

## 2023-12-31 ENCOUNTER — Ambulatory Visit (INDEPENDENT_AMBULATORY_CARE_PROVIDER_SITE_OTHER): Payer: 59 | Admitting: Family

## 2023-12-31 ENCOUNTER — Encounter: Payer: Self-pay | Admitting: Family

## 2023-12-31 VITALS — BP 110/72 | HR 90 | Ht 66.0 in | Wt 97.0 lb

## 2023-12-31 DIAGNOSIS — R636 Underweight: Secondary | ICD-10-CM

## 2023-12-31 DIAGNOSIS — M5441 Lumbago with sciatica, right side: Secondary | ICD-10-CM | POA: Diagnosis not present

## 2023-12-31 DIAGNOSIS — E43 Unspecified severe protein-calorie malnutrition: Secondary | ICD-10-CM

## 2023-12-31 DIAGNOSIS — L03119 Cellulitis of unspecified part of limb: Secondary | ICD-10-CM

## 2023-12-31 DIAGNOSIS — E559 Vitamin D deficiency, unspecified: Secondary | ICD-10-CM | POA: Diagnosis not present

## 2023-12-31 DIAGNOSIS — I5022 Chronic systolic (congestive) heart failure: Secondary | ICD-10-CM | POA: Diagnosis not present

## 2023-12-31 DIAGNOSIS — G894 Chronic pain syndrome: Secondary | ICD-10-CM | POA: Diagnosis not present

## 2023-12-31 DIAGNOSIS — M5442 Lumbago with sciatica, left side: Secondary | ICD-10-CM

## 2023-12-31 DIAGNOSIS — I739 Peripheral vascular disease, unspecified: Secondary | ICD-10-CM | POA: Diagnosis not present

## 2023-12-31 DIAGNOSIS — G8929 Other chronic pain: Secondary | ICD-10-CM

## 2023-12-31 DIAGNOSIS — M4120 Other idiopathic scoliosis, site unspecified: Secondary | ICD-10-CM

## 2023-12-31 DIAGNOSIS — I480 Paroxysmal atrial fibrillation: Secondary | ICD-10-CM

## 2023-12-31 NOTE — Progress Notes (Signed)
 Established Patient Office Visit  Subjective:  Patient ID: [name] [name], female    DOB: 19-Jun-1935  Age: 89 y.o. MRN: [id number]  Chief Complaint  Patient presents with   Follow-up    1 month follow up    Patient is here today for her 1 month follow up.  She has been feeling poorly since last appointment.   She does have additional concerns to discuss today.  She has finished up her radiation treatments and has been having significant fatigue due to these.   Labs are due today. She needs refills.   I have reviewed her active problem list, medication list, allergies, notes from last encounter, lab results for her appointment today.    No other concerns at this time.   Past Medical History:  Diagnosis Date   AKI (acute kidney injury) (HCC) 02/20/2023   Atrial fibrillation (HCC)    Basal cell carcinoma 2021   L temple   Chronic back pain    Congestive heart failure (CHF) (HCC)    History of total hip replacement (Bilateral) 01/12/2020   Left hip replacement done in 2019.  Right hip replacement done in 2012.   Hypertension    Hyponatremia 06/11/2020   Melena 02/21/2023   NSTEMI (non-ST elevated myocardial infarction) (HCC) 06/11/2020   Pharmacologic therapy 01/12/2020   Problems influencing health status 01/12/2020   Scoliosis    Upper GI bleeding 02/21/2023    Past Surgical History:  Procedure Laterality Date   ABDOMINAL SURGERY     bilateral hip replacement     CORONARY STENT INTERVENTION N/A 06/12/2020   Procedure: CORONARY STENT INTERVENTION;  Surgeon: Wenona Hamilton, MD;  Location: [facility];  Service: Cardiovascular;  Laterality: N/A;  LAD   ESOPHAGOGASTRODUODENOSCOPY (EGD) WITH PROPOFOL  N/A 02/24/2023   Procedure: ESOPHAGOGASTRODUODENOSCOPY (EGD) WITH PROPOFOL ;  Surgeon: Marnee Sink, MD;  Location: [facility];  Service: Endoscopy;  Laterality: N/A;   LEFT HEART CATH AND CORONARY ANGIOGRAPHY N/A 06/12/2020   Procedure: LEFT HEART CATH AND  CORONARY ANGIOGRAPHY;  Surgeon: Wenona Hamilton, MD;  Location: [facility];  Service: Cardiovascular;  Laterality: N/A;   LOWER EXTREMITY ANGIOGRAPHY Right 02/25/2023   Procedure: Lower Extremity Angiography;  Surgeon: Jackquelyn Mass, MD;  Location: [facility];  Service: Cardiovascular;  Laterality: Right;   TONSILLECTOMY      Social History   Socioeconomic History   Marital status: Single    Spouse name: Not on file   Number of children: 0   Years of education: Not on file   Highest education level: Not on file  Occupational History   Not on file  Tobacco Use   Smoking status: Former    Current packs/day: 0.00    Average packs/day: 0.5 packs/day for 68.0 years (34.0 ttl pk-yrs)    Types: Cigarettes    Start date: 06/11/1952    Quit date: 06/11/2020    Years since quitting: 3.7   Smokeless tobacco: Never  Substance and Sexual Activity   Alcohol use: Not Currently   Drug use: Not Currently   Sexual activity: Not on file  Other Topics Concern   Not on file  Social History Narrative   Not on file   Social Drivers of Health   Financial Resource Strain: Not on file  Food Insecurity: No Food Insecurity (10/29/2023)   Hunger Vital Sign    Worried About Running Out of Food in the Last Year: Never true    Ran Out of  Food in the Last Year: Never true  Transportation Needs: No Transportation Needs (10/29/2023)   PRAPARE - Administrator, Civil Service (Medical): No    Lack of Transportation (Non-Medical): No  Physical Activity: Insufficiently Active (12/25/2022)   Exercise Vital Sign    Days of Exercise per Week: 7 days    Minutes of Exercise per Session: 20 min  Stress: Stress Concern Present (12/25/2022)   [employer] of Occupational Health - Occupational Stress Questionnaire    Feeling of Stress : Very much  Social Connections: Socially Isolated (12/25/2022)   Social Connection and Isolation Panel [NHANES]    Frequency of Communication  with Friends and Family: Never    Frequency of Social Gatherings with Friends and Family: Never    Attends Religious Services: Never    Database administrator or Organizations: No    Attends Banker Meetings: Never    Marital Status: Never married  Intimate Partner Violence: Not At Risk (10/29/2023)   Humiliation, Afraid, Rape, and Kick questionnaire    Fear of Current or Ex-Partner: No    Emotionally Abused: No    Physically Abused: No    Sexually Abused: No    Family History  Problem Relation Age of Onset   Breast cancer Mother    CVA Father    Heart disease Father    Arthritis/Rheumatoid Sister    Breast cancer Paternal Grandmother     Allergies  Allergen Reactions   Spironolactone Anaphylaxis   Fluogen [Influenza Virus Vaccine] Hives   Statins     Dizziness & weakness   Sulfa Antibiotics Other (See Comments)    Unknown, happened as a child    Review of Systems  All other systems reviewed and are negative.      Objective:   BP 110/72   Pulse 90   Ht 5\' 6"  (1.676 m)   Wt 97 lb (44 kg)   SpO2 97%   BMI 15.66 kg/m   Vitals:   12/31/23 0858  BP: 110/72  Pulse: 90  Height: 5\' 6"  (1.676 m)  Weight: 97 lb (44 kg)  SpO2: 97%  BMI (Calculated): 15.66    Physical Exam Vitals and nursing note reviewed.  Constitutional:      Appearance: Normal appearance. She is normal weight.  HENT:     Head: Normocephalic.  Eyes:     Extraocular Movements: Extraocular movements intact.     Conjunctiva/sclera: Conjunctivae normal.     Pupils: Pupils are equal, round, and reactive to light.  Cardiovascular:     Rate and Rhythm: Normal rate.  Pulmonary:     Effort: Pulmonary effort is normal.  Neurological:     General: No focal deficit present.     Mental Status: She is alert and oriented to person, place, and time. Mental status is at baseline.  Psychiatric:        Mood and Affect: Mood normal.        Behavior: Behavior normal.        Thought  Content: Thought content normal.      No results found for any visits on 12/31/23.  Recent Results (from the past 2160 hours)  Rad Onc Aria Session Summary     Status: None   Collection Time: 12/08/23 10:07 AM  Result Value Ref Range   Course ID [email or web address]    Course Intent Curative    Course Start Date 11/26/2023  1:24 PM    Session Number 6  Course First Treatment Date 12/02/2023 10:03 AM    Course Last Treatment Date 12/08/2023 10:07 AM    Course Elapsed Days 6    Reference Point ID Rt Cheek DP    Reference Point Dosage Given to Date 11 Gy   Reference Point Session Dosage Given 2.2 Gy   Reference Point ID Rt Nose DP    Reference Point Dosage Given to Date 11 Gy   Reference Point Session Dosage Given 2.2 Gy   Plan ID [email or web address]_BO    Plan Fractions Treated to Date 5    Plan Total Fractions Prescribed 20    Plan Prescribed Dose Per Fraction 2.2 Gy   Plan Total Prescribed Dose 44.000000 Gy   Plan Primary Reference Point Rt Cheek DP    Plan ID [email or web address]_BO    Plan Fractions Treated to Date 5    Plan Total Fractions Prescribed 20    Plan Prescribed Dose Per Fraction 2.2 Gy   Plan Total Prescribed Dose 44.000000 Gy   Plan Primary Reference Point Rt Nose DP   Rad Onc Aria Session Summary     Status: None   Collection Time: 12/09/23 10:05 AM  Result Value Ref Range   Course ID [email or web address]    Course Intent Curative    Course Start Date 11/26/2023  1:24 PM    Session Number 7    Course First Treatment Date 12/02/2023 10:03 AM    Course Last Treatment Date 12/09/2023 10:05 AM    Course Elapsed Days 7    Reference Point ID Rt Cheek DP    Reference Point Dosage Given to Date 13.2 Gy   Reference Point Session Dosage Given 2.2 Gy   Reference Point ID Rt Nose DP    Reference Point Dosage Given to Date 13.2 Gy   Reference Point Session Dosage Given 2.2 Gy   Plan ID [email or web address]_BO    Plan Fractions Treated to Date 6    Plan Total Fractions Prescribed 20    Plan Prescribed Dose Per Fraction 2.2  Gy   Plan Total Prescribed Dose 44.000000 Gy   Plan Primary Reference Point Rt Cheek DP    Plan ID [email or web address]_BO    Plan Fractions Treated to Date 6    Plan Total Fractions Prescribed 20    Plan Prescribed Dose Per Fraction 2.2 Gy   Plan Total Prescribed Dose 44.000000 Gy   Plan Primary Reference Point Rt Nose DP   Rad Onc Aria Session Summary     Status: None   Collection Time: 12/10/23  9:48 AM  Result Value Ref Range   Course ID [email or web address]    Course Intent Curative    Course Start Date 11/26/2023  1:24 PM    Session Number 8    Course First Treatment Date 12/02/2023 10:03 AM    Course Last Treatment Date 12/10/2023  9:47 AM    Course Elapsed Days 8    Reference Point ID Rt Cheek DP    Reference Point Dosage Given to Date 15.4 Gy   Reference Point Session Dosage Given 2.2 Gy   Reference Point ID Rt Nose DP    Reference Point Dosage Given to Date 15.4 Gy   Reference Point Session Dosage Given 2.2 Gy   Plan ID [email or web address]_BO    Plan Fractions Treated to Date 7    Plan Total Fractions Prescribed 20    Plan Prescribed Dose Per Fraction 2.2 Gy   Plan Total Prescribed Dose 44.000000 Gy   Plan Primary Reference Point  Rt Cheek DP    Plan ID [email or web address]_BO    Plan Fractions Treated to Date 7    Plan Total Fractions Prescribed 20    Plan Prescribed Dose Per Fraction 2.2 Gy   Plan Total Prescribed Dose 44.000000 Gy   Plan Primary Reference Point Rt Nose DP   Rad Onc Aria Session Summary     Status: None   Collection Time: 12/11/23  9:50 AM  Result Value Ref Range   Course ID [email or web address]    Course Intent Curative    Course Start Date 11/26/2023  1:24 PM    Session Number 9    Course First Treatment Date 12/02/2023 10:03 AM    Course Last Treatment Date 12/11/2023  9:49 AM    Course Elapsed Days 9    Reference Point ID Rt Cheek DP    Reference Point Dosage Given to Date 17.6 Gy   Reference Point Session Dosage Given 2.2 Gy   Reference Point ID Rt Nose DP    Reference Point Dosage Given to Date  17.6 Gy   Reference Point Session Dosage Given 2.2 Gy   Plan ID [email or web address]_BO    Plan Fractions Treated to Date 8    Plan Total Fractions Prescribed 20    Plan Prescribed Dose Per Fraction 2.2 Gy   Plan Total Prescribed Dose 44.000000 Gy   Plan Primary Reference Point Rt Cheek DP    Plan ID [email or web address]_BO    Plan Fractions Treated to Date 8    Plan Total Fractions Prescribed 20    Plan Prescribed Dose Per Fraction 2.2 Gy   Plan Total Prescribed Dose 44.000000 Gy   Plan Primary Reference Point Rt Nose DP   Rad Onc Aria Session Summary     Status: None   Collection Time: 12/12/23  9:48 AM  Result Value Ref Range   Course ID [email or web address]    Course Intent Curative    Course Start Date 11/26/2023  1:24 PM    Session Number 10    Course First Treatment Date 12/02/2023 10:03 AM    Course Last Treatment Date 12/12/2023  9:47 AM    Course Elapsed Days 10    Reference Point ID Rt Cheek DP    Reference Point Dosage Given to Date 19.8 Gy   Reference Point Session Dosage Given 2.2 Gy   Reference Point ID Rt Nose DP    Reference Point Dosage Given to Date 19.8 Gy   Reference Point Session Dosage Given 2.2 Gy   Plan ID [email or web address]_BO    Plan Fractions Treated to Date 9    Plan Total Fractions Prescribed 20    Plan Prescribed Dose Per Fraction 2.2 Gy   Plan Total Prescribed Dose 44.000000 Gy   Plan Primary Reference Point Rt Cheek DP    Plan ID [email or web address]_BO    Plan Fractions Treated to Date 9    Plan Total Fractions Prescribed 20    Plan Prescribed Dose Per Fraction 2.2 Gy   Plan Total Prescribed Dose 44.000000 Gy   Plan Primary Reference Point Rt Nose DP   Rad Onc Aria Session Summary     Status: None   Collection Time: 12/15/23  9:54 AM  Result Value Ref Range   Course ID [email or web address]    Course Intent Curative    Course Start Date 11/26/2023  1:24 PM    Session Number 11    Course First Treatment Date 12/02/2023 10:03 AM    Course Last Treatment Date 12/15/2023  9:53 AM    Course Elapsed Days 13     Reference Point ID Rt Cheek DP    Reference Point Dosage Given to Date 22 Gy   Reference Point Session Dosage Given 2.2 Gy   Reference Point ID Rt Nose DP    Reference Point Dosage Given to Date 22 Gy   Reference Point Session Dosage Given 2.2 Gy   Plan ID [email or web address]_BO    Plan Fractions Treated to Date 10    Plan Total Fractions Prescribed 20    Plan Prescribed Dose Per Fraction 2.2 Gy   Plan Total Prescribed Dose 44.000000 Gy   Plan Primary Reference Point Rt Cheek DP    Plan ID [email or web address]_Nose_BO    Plan Fractions Treated to Date 10    Plan Total Fractions Prescribed 20    Plan Prescribed Dose Per Fraction 2.2 Gy   Plan Total Prescribed Dose 44.000000 Gy   Plan Primary Reference Point Rt Nose DP   Rad Onc Aria Session Summary     Status: None   Collection Time: 12/16/23  9:54 AM  Result Value Ref Range   Course ID [email or web address]    Course Intent Curative    Course Start Date 11/26/2023  1:24 PM    Session Number 12    Course First Treatment Date 12/02/2023 10:03 AM    Course Last Treatment Date 12/16/2023  9:53 AM    Course Elapsed Days 14    Reference Point ID Rt Cheek DP    Reference Point Dosage Given to Date 24.2 Gy   Reference Point Session Dosage Given 2.2 Gy   Reference Point ID Rt Nose DP    Reference Point Dosage Given to Date 24.2 Gy   Reference Point Session Dosage Given 2.2 Gy   Plan ID [email or web address]_BO    Plan Fractions Treated to Date 11    Plan Total Fractions Prescribed 20    Plan Prescribed Dose Per Fraction 2.2 Gy   Plan Total Prescribed Dose 44.000000 Gy   Plan Primary Reference Point Rt Cheek DP    Plan ID [email or web address]_BO    Plan Fractions Treated to Date 11    Plan Total Fractions Prescribed 20    Plan Prescribed Dose Per Fraction 2.2 Gy   Plan Total Prescribed Dose 44.000000 Gy   Plan Primary Reference Point Rt Nose DP   Rad Onc Aria Session Summary     Status: None   Collection Time: 12/17/23 10:01 AM  Result Value Ref Range   Course ID [email or web address]    Course Intent  Curative    Course Start Date 11/26/2023  1:24 PM    Session Number 13    Course First Treatment Date 12/02/2023 10:03 AM    Course Last Treatment Date 12/17/2023 10:00 AM    Course Elapsed Days 15    Reference Point ID Rt Cheek DP    Reference Point Dosage Given to Date 26.4 Gy   Reference Point Session Dosage Given 2.2 Gy   Reference Point ID Rt Nose DP    Reference Point Dosage Given to Date 26.4 Gy   Reference Point Session Dosage Given 2.2 Gy   Plan ID [email or web address]_BO    Plan Fractions Treated to Date 12    Plan Total Fractions Prescribed 20    Plan Prescribed Dose Per Fraction 2.2 Gy   Plan Total Prescribed Dose 44.000000 Gy   Plan Primary Reference Point Rt Cheek DP    Plan ID [email or web address]_Nose_BO    Plan Fractions  Treated to Date 12    Plan Total Fractions Prescribed 20    Plan Prescribed Dose Per Fraction 2.2 Gy   Plan Total Prescribed Dose 44.000000 Gy   Plan Primary Reference Point Rt Nose DP   Rad Onc Aria Session Summary     Status: None   Collection Time: 12/18/23  9:53 AM  Result Value Ref Range   Course ID C1_HN    Course Intent Curative    Course Start Date 11/26/2023  1:24 PM    Session Number 14    Course First Treatment Date 12/02/2023 10:03 AM    Course Last Treatment Date 12/18/2023  9:52 AM    Course Elapsed Days 16    Reference Point ID Rt Cheek DP    Reference Point Dosage Given to Date 28.6 Gy   Reference Point Session Dosage Given 2.2 Gy   Reference Point ID Rt Nose DP    Reference Point Dosage Given to Date 28.6 Gy   Reference Point Session Dosage Given 2.2 Gy   Plan ID [email or web address]_BO    Plan Fractions Treated to Date 13    Plan Total Fractions Prescribed 20    Plan Prescribed Dose Per Fraction 2.2 Gy   Plan Total Prescribed Dose 44.000000 Gy   Plan Primary Reference Point Rt Cheek DP    Plan ID [email or web address]_Nose_BO    Plan Fractions Treated to Date 13    Plan Total Fractions Prescribed 20    Plan Prescribed Dose Per Fraction 2.2 Gy   Plan Total Prescribed Dose  44.000000 Gy   Plan Primary Reference Point Rt Nose DP   Rad Onc Aria Session Summary     Status: None   Collection Time: 12/19/23  9:57 AM  Result Value Ref Range   Course ID [email or web address]    Course Intent Curative    Course Start Date 11/26/2023  1:24 PM    Session Number 15    Course First Treatment Date 12/02/2023 10:03 AM    Course Last Treatment Date 12/19/2023  9:57 AM    Course Elapsed Days 17    Reference Point ID Rt Cheek DP    Reference Point Dosage Given to Date 30.8 Gy   Reference Point Session Dosage Given 2.2 Gy   Reference Point ID Rt Nose DP    Reference Point Dosage Given to Date 30.8 Gy   Reference Point Session Dosage Given 2.2 Gy   Plan ID [email or web address]_Cheek_BO    Plan Fractions Treated to Date 14    Plan Total Fractions Prescribed 20    Plan Prescribed Dose Per Fraction 2.2 Gy   Plan Total Prescribed Dose 44.000000 Gy   Plan Primary Reference Point Rt Cheek DP    Plan ID [email or web address]_Nose_BO    Plan Fractions Treated to Date 14    Plan Total Fractions Prescribed 20    Plan Prescribed Dose Per Fraction 2.2 Gy   Plan Total Prescribed Dose 44.000000 Gy   Plan Primary Reference Point Rt Nose DP   Rad Onc Aria Session Summary     Status: None   Collection Time: 12/22/23  9:57 AM  Result Value Ref Range   Course ID [email or web address]    Course Intent Curative    Course Start Date 11/26/2023  1:24 PM    Session Number 16    Course First Treatment Date 12/02/2023 10:03 AM    Course Last Treatment Date 12/22/2023  9:56 AM    Course Elapsed Days 20    Reference Point  ID Rt Cheek DP    Reference Point Dosage Given to Date 33 Gy   Reference Point Session Dosage Given 2.2 Gy   Reference Point ID Rt Nose DP    Reference Point Dosage Given to Date 33 Gy   Reference Point Session Dosage Given 2.2 Gy   Plan ID [email or web address]_BO    Plan Fractions Treated to Date 15    Plan Total Fractions Prescribed 20    Plan Prescribed Dose Per Fraction 2.2 Gy   Plan Total Prescribed Dose 44.000000 Gy   Plan Primary  Reference Point Rt Cheek DP    Plan ID [email or web address]_Nose_BO    Plan Fractions Treated to Date 15    Plan Total Fractions Prescribed 20    Plan Prescribed Dose Per Fraction 2.2 Gy   Plan Total Prescribed Dose 44.000000 Gy   Plan Primary Reference Point Rt Nose DP   Rad Onc Aria Session Summary     Status: None   Collection Time: 12/23/23  9:47 AM  Result Value Ref Range   Course ID [email or web address]    Course Intent Curative    Course Start Date 11/26/2023  1:24 PM    Session Number 17    Course First Treatment Date 12/02/2023 10:03 AM    Course Last Treatment Date 12/23/2023  9:46 AM    Course Elapsed Days 21    Reference Point ID Rt Cheek DP    Reference Point Dosage Given to Date 35.2 Gy   Reference Point Session Dosage Given 2.2 Gy   Reference Point ID Rt Nose DP    Reference Point Dosage Given to Date 35.2 Gy   Reference Point Session Dosage Given 2.2 Gy   Plan ID [email or web address]_BO    Plan Fractions Treated to Date 16    Plan Total Fractions Prescribed 20    Plan Prescribed Dose Per Fraction 2.2 Gy   Plan Total Prescribed Dose 44.000000 Gy   Plan Primary Reference Point Rt Cheek DP    Plan ID [email or web address]_Nose_BO    Plan Fractions Treated to Date 16    Plan Total Fractions Prescribed 20    Plan Prescribed Dose Per Fraction 2.2 Gy   Plan Total Prescribed Dose 44.000000 Gy   Plan Primary Reference Point Rt Nose DP   Rad Onc Aria Session Summary     Status: None   Collection Time: 12/24/23  9:59 AM  Result Value Ref Range   Course ID [email or web address]    Course Intent Curative    Course Start Date 11/26/2023  1:24 PM    Session Number 18    Course First Treatment Date 12/02/2023 10:03 AM    Course Last Treatment Date 12/24/2023  9:58 AM    Course Elapsed Days 22    Reference Point ID Rt Cheek DP    Reference Point Dosage Given to Date 37.4 Gy   Reference Point Session Dosage Given 2.2 Gy   Reference Point ID Rt Nose DP    Reference Point Dosage Given to Date 37.4 Gy   Reference Point Session Dosage Given 2.2 Gy    Plan ID [email or web address]_BO    Plan Fractions Treated to Date 17    Plan Total Fractions Prescribed 20    Plan Prescribed Dose Per Fraction 2.2 Gy   Plan Total Prescribed Dose 44.000000 Gy   Plan Primary Reference Point Rt Cheek DP    Plan ID [email or web address]_Nose_BO    Plan Fractions Treated to Date 17    Plan Total Fractions Prescribed 20  Plan Prescribed Dose Per Fraction 2.2 Gy   Plan Total Prescribed Dose 44.000000 Gy   Plan Primary Reference Point Rt Nose DP   Rad Onc Aria Session Summary     Status: None   Collection Time: 12/25/23 10:00 AM  Result Value Ref Range   Course ID [email or web address]    Course Intent Curative    Course Start Date 11/26/2023  1:24 PM    Session Number 19    Course First Treatment Date 12/02/2023 10:03 AM    Course Last Treatment Date 12/25/2023  9:59 AM    Course Elapsed Days 23    Reference Point ID Rt Cheek DP    Reference Point Dosage Given to Date 39.6 Gy   Reference Point Session Dosage Given 2.2 Gy   Reference Point ID Rt Nose DP    Reference Point Dosage Given to Date 39.6 Gy   Reference Point Session Dosage Given 2.2 Gy   Plan ID [email or web address]_BO    Plan Fractions Treated to Date 18    Plan Total Fractions Prescribed 20    Plan Prescribed Dose Per Fraction 2.2 Gy   Plan Total Prescribed Dose 44.000000 Gy   Plan Primary Reference Point Rt Cheek DP    Plan ID [email or web address]_BO    Plan Fractions Treated to Date 18    Plan Total Fractions Prescribed 20    Plan Prescribed Dose Per Fraction 2.2 Gy   Plan Total Prescribed Dose 44.000000 Gy   Plan Primary Reference Point Rt Nose DP   Rad Onc Aria Session Summary     Status: None   Collection Time: 12/26/23  9:49 AM  Result Value Ref Range   Course ID [email or web address]    Course Intent Curative    Course Start Date 11/26/2023  1:24 PM    Session Number 20    Course First Treatment Date 12/02/2023 10:03 AM    Course Last Treatment Date 12/26/2023  9:48 AM    Course Elapsed Days 24    Reference Point ID Rt Cheek DP    Reference Point  Dosage Given to Date 41.8 Gy   Reference Point Session Dosage Given 2.2 Gy   Reference Point ID Rt Nose DP    Reference Point Dosage Given to Date 41.8 Gy   Reference Point Session Dosage Given 2.2 Gy   Plan ID [email or web address]_BO    Plan Fractions Treated to Date 19    Plan Total Fractions Prescribed 20    Plan Prescribed Dose Per Fraction 2.2 Gy   Plan Total Prescribed Dose 44.000000 Gy   Plan Primary Reference Point Rt Cheek DP    Plan ID [email or web address]_BO    Plan Fractions Treated to Date 19    Plan Total Fractions Prescribed 20    Plan Prescribed Dose Per Fraction 2.2 Gy   Plan Total Prescribed Dose 44.000000 Gy   Plan Primary Reference Point Rt Nose DP   Rad Onc Aria Session Summary     Status: None   Collection Time: 12/29/23  9:46 AM  Result Value Ref Range   Course ID [email or web address]    Course Intent Curative    Course Start Date 11/26/2023  1:24 PM    Session Number 21    Course First Treatment Date 12/02/2023 10:03 AM    Course Last Treatment Date 12/29/2023  9:46 AM    Course Elapsed Days 27    Reference Point ID Rt Cheek DP    Reference Point Dosage Given to Date 44 Gy  Reference Point Session Dosage Given 2.2 Gy   Reference Point ID Rt Nose DP    Reference Point Dosage Given to Date 44 Gy   Reference Point Session Dosage Given 2.2 Gy   Plan ID [email or web address]_BO    Plan Fractions Treated to Date 20    Plan Total Fractions Prescribed 20    Plan Prescribed Dose Per Fraction 2.2 Gy   Plan Total Prescribed Dose 44.000000 Gy   Plan Primary Reference Point Rt Cheek DP    Plan ID [email or web address]    Plan Fractions Treated to Date 20    Plan Total Fractions Prescribed 20    Plan Prescribed Dose Per Fraction 2.2 Gy   Plan Total Prescribed Dose 44.000000 Gy   Plan Primary Reference Point Rt Nose DP   Lactic acid, plasma     Status: None   Collection Time: 01/21/24  1:58 PM  Result Value Ref Range   Lactic Acid, Venous 0.8 0.5 - 1.9 mmol/L    Comment: Performed at [facility], [address] [place], [place] [address]  Comprehensive metabolic panel     Status: Abnormal   Collection Time: 01/21/24  1:58 PM  Result Value Ref Range   Sodium 133 (L) 135 - 145 mmol/L   Potassium 4.8 3.5 - 5.1 mmol/L   Chloride 98 98 - 111 mmol/L   CO2 24 22 - 32 mmol/L   Glucose, Bld 104 (H) 70 - 99 mg/dL    Comment: Glucose reference range applies only to samples taken after fasting for at least 8 hours.   BUN 22 8 - 23 mg/dL   Creatinine, Ser 6.04 (H) 0.44 - 1.00 mg/dL   Calcium  8.7 (L) 8.9 - 10.3 mg/dL   Total Protein 6.9 6.5 - 8.1 g/dL   Albumin 3.9 3.5 - 5.0 g/dL   AST 27 15 - 41 U/L   ALT 19 0 - 44 U/L   Alkaline Phosphatase 97 38 - 126 U/L   Total Bilirubin 0.8 0.0 - 1.2 mg/dL   GFR, Estimated 52 (L) >60 mL/min    Comment: (NOTE) Calculated using the CKD-EPI Creatinine Equation (2021)    Anion gap 11 5 - 15    Comment: Performed at [facility], [address] [place], [place] [address]  CBC with Differential     Status: Abnormal   Collection Time: 01/21/24  1:58 PM  Result Value Ref Range   WBC 7.3 4.0 - 10.5 K/uL   RBC 2.89 (L) 3.87 - 5.11 MIL/uL   Hemoglobin 8.7 (L) 12.0 - 15.0 g/dL   HCT 11.9 (L) 14.7 - 82.9 %   MCV 95.2 80.0 - 100.0 fL   M[facility] 30.1 26.0 - 34.0 pg   MCHC 31.6 30.0 - 36.0 g/dL   RDW 56.2 13.0 - 86.5 %   Platelets 259 150 - 400 K/uL   nRBC 0.0 0.0 - 0.2 %   Neutrophils Relative % 81 %   Neutro Abs 5.9 1.7 - 7.7 K/uL   Lymphocytes Relative 9 %   Lymphs Abs 0.7 0.7 - 4.0 K/uL   Monocytes Relative 10 %   Monocytes Absolute 0.7 0.1 - 1.0 K/uL   Eosinophils Relative 0 %   Eosinophils Absolute 0.0 0.0 - 0.5 K/uL   Basophils Relative 0 %   Basophils Absolute 0.0 0.0 - 0.1 K/uL   Immature Granulocytes 0 %   Abs Immature Granulocytes 0.03 0.00 - 0.07 K/uL    Comment: Performed at [facility],  [address], [place], [place] [address]  POC Influenza A&B (Binax test)     Status: None   Collection Time: 01/28/24 10:59 AM   Result Value Ref Range   Influenza A, POC Negative Negative   Influenza B, POC Negative Negative  Basic metabolic panel     Status: Abnormal   Collection Time: 01/29/24 11:14 AM  Result Value Ref Range   Sodium 135 135 - 145 mmol/L   Potassium 4.3 3.5 - 5.1 mmol/L   Chloride 105 98 - 111 mmol/L   CO2 22 22 - 32 mmol/L   Glucose, Bld 156 (H) 70 - 99 mg/dL    Comment: Glucose reference range applies only to samples taken after fasting for at least 8 hours.   BUN 41 (H) 8 - 23 mg/dL   Creatinine, Ser 2.13 (H) 0.44 - 1.00 mg/dL   Calcium  8.3 (L) 8.9 - 10.3 mg/dL   GFR, Estimated 38 (L) >60 mL/min    Comment: (NOTE) Calculated using the CKD-EPI Creatinine Equation (2021)    Anion gap 8 5 - 15    Comment: Performed at [facility], [address] [place], [place] [address]  CBC     Status: Abnormal   Collection Time: 01/29/24 11:14 AM  Result Value Ref Range   WBC 5.5 4.0 - 10.5 K/uL   RBC 3.37 (L) 3.87 - 5.11 MIL/uL   Hemoglobin 10.1 (L) 12.0 - 15.0 g/dL   HCT 84.6 (L) 96.2 - 95.2 %   MCV 94.7 80.0 - 100.0 fL   MCH 30.0 26.0 - 34.0 pg   MCHC 31.7 30.0 - 36.0 g/dL   RDW 84.1 32.4 - 40.1 %   Platelets 311 150 - 400 K/uL   nRBC 0.0 0.0 - 0.2 %    Comment: Performed at [facility], [address], [place], [place] [address]  Troponin I (High Sensitivity)     Status: None   Collection Time: 01/29/24 11:14 AM  Result Value Ref Range   Troponin I (High Sensitivity) 16 <18 ng/L    Comment: (NOTE) Elevated high sensitivity troponin I (hsTnI) values and significant  changes across serial measurements may suggest ACS but many other  chronic and acute conditions are known to elevate hsTnI results.  Refer to the "Links" section for chest pain algorithms and additional  guidance. Performed at [facility], [address] [place], [place] [address]   Protime-INR     Status: Abnormal   Collection Time: 01/29/24 11:14 AM  Result Value Ref Range    Prothrombin Time 17.4 (H) 11.4 - 15.2 seconds   INR 1.4 (H) 0.8 - 1.2    Comment: (NOTE) INR goal varies based on device and disease states. Performed at [facility], [address] [place], [place] [address]   POCT Urinalysis Dipstick [phone])     Status: Abnormal   Collection Time: 02/10/24  1:32 PM  Result Value Ref Range   Color, UA Yellow    Clarity, UA Clear    Glucose, UA Negative Negative   Bilirubin, UA Negative    Ketones, UA Negative    Spec Grav, UA 1.015 1.010 - 1.025   Blood, UA Trace    pH, UA 5.5 5.0 - 8.0   Protein, UA Positive (A) Negative   Urobilinogen, UA 0.2 0.2 or 1.0 E.U./dL   Nitrite, UA Negative    Leukocytes, UA Negative Negative   Appearance Clear    Odor Yes   Urinalysis, Routine  w reflex microscopic     Status: Abnormal   Collection Time: 02/11/24  2:00 PM  Result Value Ref Range   Specific Gravity, UA 1.026 1.005 - 1.030   pH, UA >=9.0 (A) 5.0 - 7.5   Color, UA Yellow Yellow   Appearance Ur Turbid (A) Clear   Leukocytes,UA Negative Negative   Protein,UA 1+ (A) Negative/Trace   Glucose, UA Negative Negative   Ketones, UA Negative Negative   RBC, UA Negative Negative   Bilirubin, UA Negative Negative   Urobilinogen, Ur 0.2 0.2 - 1.0 mg/dL   Nitrite, UA Positive (A) Negative   Microscopic Examination See below:     Comment: Microscopic was indicated and was performed.  Urine Culture     Status: Abnormal   Collection Time: 02/11/24  2:00 PM   Specimen: Urine, Clean Catch   UR  Result Value Ref Range   Urine Culture, Routine Final report (A)    Organism ID, Bacteria Proteus mirabilis (A)     Comment: Cefazolin  with an MIC <=16 predicts susceptibility to the oral agents cefaclor, cefdinir, cefpodoxime, cefprozil, cefuroxime, cephalexin , and loracarbef when used for therapy of uncomplicated urinary tract infections due to E. coli, Klebsiella pneumoniae, and Proteus mirabilis. Multi-Drug Resistant Organism Greater than 100,000  colony forming units per mL    Antimicrobial Susceptibility Comment     Comment:       ** S = Susceptible; I = Intermediate; R = Resistant **                    P = Positive; N = Negative             MICS are expressed in micrograms per mL    Antibiotic                 RSLT#1    RSLT#2    RSLT#3    RSLT#4 Ampicillin                     R Cefazolin                       S Cefepime                       S Cefoxitin                      S Cefpodoxime                    S Ceftriaxone                     S Ciprofloxacin                   S Ertapenem                      S Gentamicin                     S Levofloxacin                   I Meropenem                      S Nitrofurantoin                 R Piperacillin/Tazobactam        S Tetracycline  R Tobramycin                     S Trimethoprim/Sulfa             S   Microscopic Examination     Status: Abnormal   Collection Time: 02/11/24  2:00 PM  Result Value Ref Range   WBC, UA None seen 0 - 5 /hpf   RBC, Urine 0-2 0 - 2 /hpf   Epithelial Cells (non renal) 0-10 0 - 10 /hpf   Casts None seen None seen /lpf   Bacteria, UA Many (A) None seen/Few  POCT Urinalysis Dipstick (25366)     Status: Abnormal   Collection Time: 03/02/24  9:45 AM  Result Value Ref Range   Color, UA Orange    Clarity, UA Cloudy    Glucose, UA Negative Negative   Bilirubin, UA Negative    Ketones, UA Negative    Spec Grav, UA 1.020 1.010 - 1.025   Blood, UA Negative    pH, UA 5.5 5.0 - 8.0   Protein, UA Positive (A) Negative   Urobilinogen, UA 0.2 0.2 or 1.0 E.U./dL   Nitrite, UA Negative    Leukocytes, UA Negative Negative   Appearance Cloudy    Odor Yes        Assessment & Plan:   Problem List Items Addressed This Visit       Cardiovascular and Mediastinum   Chronic systolic (congestive) heart failure (HCC)   Patient is seen by Cardiology, who manage this condition.  She is well controlled with current therapy.   Will defer  to them for further changes to plan of care.       PVD (peripheral vascular disease) (HCC)   Patient is seen by Vascular, who manage this condition.  She is well controlled with current therapy.   Will defer to them for further changes to plan of care.         Nervous and Auditory   Chronic low back pain (1ry area of Pain) (Bilateral) (R>L) w/ sciatica (Bilateral) (Chronic)     Musculoskeletal and Integument   Idiopathic scoliosis and kyphoscoliosis     Other   Chronic pain syndrome (Chronic)   Patient stable.  Well controlled with current therapy.   Continue current meds.        Protein-calorie malnutrition, severe (HCC)   Patient has been trying to make an effort to increase her nutritional intake.  Suggest supplementary shakes or something similar.       Vitamin D  deficiency   Will continue supplements as needed.        Cellulitis of multiple sites of lower extremity - Primary   Patient has been seeing Vascular regarding her leg wounds She will not go to the wound center again.   She does have HH nurse coming out twice weekly to change dressings.   Legs are looking better today.        Other Visit Diagnoses       Underweight           Return in about 1 month (around 01/28/2024) for F/U.   Total time spent: 30 minutes  Trenda Frisk, FNP  12/31/2023   This document may have been prepared by [facility] Voice Recognition software and as such may include unintentional dictation errors.

## 2024-01-01 ENCOUNTER — Other Ambulatory Visit: Payer: Self-pay | Admitting: Family

## 2024-01-01 DIAGNOSIS — L97812 Non-pressure chronic ulcer of other part of right lower leg with fat layer exposed: Secondary | ICD-10-CM | POA: Diagnosis not present

## 2024-01-01 DIAGNOSIS — L97422 Non-pressure chronic ulcer of left heel and midfoot with fat layer exposed: Secondary | ICD-10-CM | POA: Diagnosis not present

## 2024-01-01 DIAGNOSIS — Q7271 Split foot, right lower limb: Secondary | ICD-10-CM | POA: Diagnosis not present

## 2024-01-01 DIAGNOSIS — I110 Hypertensive heart disease with heart failure: Secondary | ICD-10-CM | POA: Diagnosis not present

## 2024-01-01 DIAGNOSIS — D649 Anemia, unspecified: Secondary | ICD-10-CM | POA: Diagnosis not present

## 2024-01-01 DIAGNOSIS — Z7982 Long term (current) use of aspirin: Secondary | ICD-10-CM | POA: Diagnosis not present

## 2024-01-01 DIAGNOSIS — I5022 Chronic systolic (congestive) heart failure: Secondary | ICD-10-CM | POA: Diagnosis not present

## 2024-01-01 DIAGNOSIS — E569 Vitamin deficiency, unspecified: Secondary | ICD-10-CM | POA: Diagnosis not present

## 2024-01-01 DIAGNOSIS — Z9181 History of falling: Secondary | ICD-10-CM | POA: Diagnosis not present

## 2024-01-01 DIAGNOSIS — Z791 Long term (current) use of non-steroidal anti-inflammatories (NSAID): Secondary | ICD-10-CM | POA: Diagnosis not present

## 2024-01-01 DIAGNOSIS — I2510 Atherosclerotic heart disease of native coronary artery without angina pectoris: Secondary | ICD-10-CM | POA: Diagnosis not present

## 2024-01-01 DIAGNOSIS — I739 Peripheral vascular disease, unspecified: Secondary | ICD-10-CM | POA: Diagnosis not present

## 2024-01-01 DIAGNOSIS — Z7902 Long term (current) use of antithrombotics/antiplatelets: Secondary | ICD-10-CM | POA: Diagnosis not present

## 2024-01-01 DIAGNOSIS — I4891 Unspecified atrial fibrillation: Secondary | ICD-10-CM | POA: Diagnosis not present

## 2024-01-01 DIAGNOSIS — I872 Venous insufficiency (chronic) (peripheral): Secondary | ICD-10-CM | POA: Diagnosis not present

## 2024-01-01 DIAGNOSIS — Z556 Problems related to health literacy: Secondary | ICD-10-CM | POA: Diagnosis not present

## 2024-01-02 MED ORDER — OXYCODONE-ACETAMINOPHEN 10-325 MG PO TABS: 1.0000 | ORAL_TABLET | Freq: Four times a day (QID) | ORAL | 0 refills | Status: DC | PRN

## 2024-01-06 DIAGNOSIS — I2510 Atherosclerotic heart disease of native coronary artery without angina pectoris: Secondary | ICD-10-CM | POA: Diagnosis not present

## 2024-01-06 DIAGNOSIS — Z556 Problems related to health literacy: Secondary | ICD-10-CM | POA: Diagnosis not present

## 2024-01-06 DIAGNOSIS — Q7271 Split foot, right lower limb: Secondary | ICD-10-CM | POA: Diagnosis not present

## 2024-01-06 DIAGNOSIS — I739 Peripheral vascular disease, unspecified: Secondary | ICD-10-CM | POA: Diagnosis not present

## 2024-01-06 DIAGNOSIS — L97812 Non-pressure chronic ulcer of other part of right lower leg with fat layer exposed: Secondary | ICD-10-CM | POA: Diagnosis not present

## 2024-01-06 DIAGNOSIS — Z7902 Long term (current) use of antithrombotics/antiplatelets: Secondary | ICD-10-CM | POA: Diagnosis not present

## 2024-01-06 DIAGNOSIS — Z9181 History of falling: Secondary | ICD-10-CM | POA: Diagnosis not present

## 2024-01-06 DIAGNOSIS — Z7982 Long term (current) use of aspirin: Secondary | ICD-10-CM | POA: Diagnosis not present

## 2024-01-06 DIAGNOSIS — E569 Vitamin deficiency, unspecified: Secondary | ICD-10-CM | POA: Diagnosis not present

## 2024-01-06 DIAGNOSIS — I5022 Chronic systolic (congestive) heart failure: Secondary | ICD-10-CM | POA: Diagnosis not present

## 2024-01-06 DIAGNOSIS — L97422 Non-pressure chronic ulcer of left heel and midfoot with fat layer exposed: Secondary | ICD-10-CM | POA: Diagnosis not present

## 2024-01-06 DIAGNOSIS — I110 Hypertensive heart disease with heart failure: Secondary | ICD-10-CM | POA: Diagnosis not present

## 2024-01-06 DIAGNOSIS — I872 Venous insufficiency (chronic) (peripheral): Secondary | ICD-10-CM | POA: Diagnosis not present

## 2024-01-06 DIAGNOSIS — I4891 Unspecified atrial fibrillation: Secondary | ICD-10-CM | POA: Diagnosis not present

## 2024-01-06 DIAGNOSIS — D649 Anemia, unspecified: Secondary | ICD-10-CM | POA: Diagnosis not present

## 2024-01-06 DIAGNOSIS — Z791 Long term (current) use of non-steroidal anti-inflammatories (NSAID): Secondary | ICD-10-CM | POA: Diagnosis not present

## 2024-01-10 DIAGNOSIS — I872 Venous insufficiency (chronic) (peripheral): Secondary | ICD-10-CM | POA: Diagnosis not present

## 2024-01-10 DIAGNOSIS — Z7902 Long term (current) use of antithrombotics/antiplatelets: Secondary | ICD-10-CM | POA: Diagnosis not present

## 2024-01-10 DIAGNOSIS — L97812 Non-pressure chronic ulcer of other part of right lower leg with fat layer exposed: Secondary | ICD-10-CM | POA: Diagnosis not present

## 2024-01-10 DIAGNOSIS — Z791 Long term (current) use of non-steroidal anti-inflammatories (NSAID): Secondary | ICD-10-CM | POA: Diagnosis not present

## 2024-01-10 DIAGNOSIS — Z9181 History of falling: Secondary | ICD-10-CM | POA: Diagnosis not present

## 2024-01-10 DIAGNOSIS — D649 Anemia, unspecified: Secondary | ICD-10-CM | POA: Diagnosis not present

## 2024-01-10 DIAGNOSIS — L97422 Non-pressure chronic ulcer of left heel and midfoot with fat layer exposed: Secondary | ICD-10-CM | POA: Diagnosis not present

## 2024-01-10 DIAGNOSIS — I4891 Unspecified atrial fibrillation: Secondary | ICD-10-CM | POA: Diagnosis not present

## 2024-01-10 DIAGNOSIS — I5022 Chronic systolic (congestive) heart failure: Secondary | ICD-10-CM | POA: Diagnosis not present

## 2024-01-10 DIAGNOSIS — I739 Peripheral vascular disease, unspecified: Secondary | ICD-10-CM | POA: Diagnosis not present

## 2024-01-10 DIAGNOSIS — I2510 Atherosclerotic heart disease of native coronary artery without angina pectoris: Secondary | ICD-10-CM | POA: Diagnosis not present

## 2024-01-10 DIAGNOSIS — E569 Vitamin deficiency, unspecified: Secondary | ICD-10-CM | POA: Diagnosis not present

## 2024-01-10 DIAGNOSIS — Z556 Problems related to health literacy: Secondary | ICD-10-CM | POA: Diagnosis not present

## 2024-01-10 DIAGNOSIS — I110 Hypertensive heart disease with heart failure: Secondary | ICD-10-CM | POA: Diagnosis not present

## 2024-01-10 DIAGNOSIS — Q7271 Split foot, right lower limb: Secondary | ICD-10-CM | POA: Diagnosis not present

## 2024-01-10 DIAGNOSIS — Z7982 Long term (current) use of aspirin: Secondary | ICD-10-CM | POA: Diagnosis not present

## 2024-01-13 DIAGNOSIS — Z791 Long term (current) use of non-steroidal anti-inflammatories (NSAID): Secondary | ICD-10-CM | POA: Diagnosis not present

## 2024-01-13 DIAGNOSIS — Z9181 History of falling: Secondary | ICD-10-CM | POA: Diagnosis not present

## 2024-01-13 DIAGNOSIS — I872 Venous insufficiency (chronic) (peripheral): Secondary | ICD-10-CM | POA: Diagnosis not present

## 2024-01-13 DIAGNOSIS — I739 Peripheral vascular disease, unspecified: Secondary | ICD-10-CM | POA: Diagnosis not present

## 2024-01-13 DIAGNOSIS — I5022 Chronic systolic (congestive) heart failure: Secondary | ICD-10-CM | POA: Diagnosis not present

## 2024-01-13 DIAGNOSIS — L89892 Pressure ulcer of other site, stage 2: Secondary | ICD-10-CM | POA: Diagnosis not present

## 2024-01-13 DIAGNOSIS — L97812 Non-pressure chronic ulcer of other part of right lower leg with fat layer exposed: Secondary | ICD-10-CM | POA: Diagnosis not present

## 2024-01-13 DIAGNOSIS — I4891 Unspecified atrial fibrillation: Secondary | ICD-10-CM | POA: Diagnosis not present

## 2024-01-13 DIAGNOSIS — Q7271 Split foot, right lower limb: Secondary | ICD-10-CM | POA: Diagnosis not present

## 2024-01-13 DIAGNOSIS — D649 Anemia, unspecified: Secondary | ICD-10-CM | POA: Diagnosis not present

## 2024-01-13 DIAGNOSIS — L97422 Non-pressure chronic ulcer of left heel and midfoot with fat layer exposed: Secondary | ICD-10-CM | POA: Diagnosis not present

## 2024-01-13 DIAGNOSIS — Z556 Problems related to health literacy: Secondary | ICD-10-CM | POA: Diagnosis not present

## 2024-01-13 DIAGNOSIS — I110 Hypertensive heart disease with heart failure: Secondary | ICD-10-CM | POA: Diagnosis not present

## 2024-01-13 DIAGNOSIS — Z7902 Long term (current) use of antithrombotics/antiplatelets: Secondary | ICD-10-CM | POA: Diagnosis not present

## 2024-01-13 DIAGNOSIS — E569 Vitamin deficiency, unspecified: Secondary | ICD-10-CM | POA: Diagnosis not present

## 2024-01-13 DIAGNOSIS — Z7982 Long term (current) use of aspirin: Secondary | ICD-10-CM | POA: Diagnosis not present

## 2024-01-13 DIAGNOSIS — I2510 Atherosclerotic heart disease of native coronary artery without angina pectoris: Secondary | ICD-10-CM | POA: Diagnosis not present

## 2024-01-14 ENCOUNTER — Telehealth (INDEPENDENT_AMBULATORY_CARE_PROVIDER_SITE_OTHER): Payer: Self-pay

## 2024-01-14 NOTE — Telephone Encounter (Addendum)
 Harriett Sine stated that [facility] just opened Fontella's case on the 1st and she need verbal orders for wound care for the patient.   Please advise

## 2024-01-14 NOTE — Telephone Encounter (Signed)
 We were doing aquacell and dry gauze on the ankle wounds.  She should come in for ABIs. She should follow with the wound care center as we previously discussed.  She has been getting calls it seems but she is not answering to set up to be seen

## 2024-01-15 DIAGNOSIS — I110 Hypertensive heart disease with heart failure: Secondary | ICD-10-CM | POA: Diagnosis not present

## 2024-01-15 DIAGNOSIS — L97422 Non-pressure chronic ulcer of left heel and midfoot with fat layer exposed: Secondary | ICD-10-CM | POA: Diagnosis not present

## 2024-01-15 DIAGNOSIS — I2510 Atherosclerotic heart disease of native coronary artery without angina pectoris: Secondary | ICD-10-CM | POA: Diagnosis not present

## 2024-01-15 DIAGNOSIS — Z7982 Long term (current) use of aspirin: Secondary | ICD-10-CM | POA: Diagnosis not present

## 2024-01-15 DIAGNOSIS — L97812 Non-pressure chronic ulcer of other part of right lower leg with fat layer exposed: Secondary | ICD-10-CM | POA: Diagnosis not present

## 2024-01-15 DIAGNOSIS — L89892 Pressure ulcer of other site, stage 2: Secondary | ICD-10-CM | POA: Diagnosis not present

## 2024-01-15 DIAGNOSIS — I872 Venous insufficiency (chronic) (peripheral): Secondary | ICD-10-CM | POA: Diagnosis not present

## 2024-01-15 DIAGNOSIS — Z9181 History of falling: Secondary | ICD-10-CM | POA: Diagnosis not present

## 2024-01-15 DIAGNOSIS — I4891 Unspecified atrial fibrillation: Secondary | ICD-10-CM | POA: Diagnosis not present

## 2024-01-15 DIAGNOSIS — D649 Anemia, unspecified: Secondary | ICD-10-CM | POA: Diagnosis not present

## 2024-01-15 DIAGNOSIS — Q7271 Split foot, right lower limb: Secondary | ICD-10-CM | POA: Diagnosis not present

## 2024-01-15 DIAGNOSIS — Z7902 Long term (current) use of antithrombotics/antiplatelets: Secondary | ICD-10-CM | POA: Diagnosis not present

## 2024-01-15 DIAGNOSIS — I739 Peripheral vascular disease, unspecified: Secondary | ICD-10-CM | POA: Diagnosis not present

## 2024-01-15 DIAGNOSIS — Z556 Problems related to health literacy: Secondary | ICD-10-CM | POA: Diagnosis not present

## 2024-01-15 DIAGNOSIS — Z791 Long term (current) use of non-steroidal anti-inflammatories (NSAID): Secondary | ICD-10-CM | POA: Diagnosis not present

## 2024-01-15 DIAGNOSIS — E569 Vitamin deficiency, unspecified: Secondary | ICD-10-CM | POA: Diagnosis not present

## 2024-01-15 DIAGNOSIS — I5022 Chronic systolic (congestive) heart failure: Secondary | ICD-10-CM | POA: Diagnosis not present

## 2024-01-15 NOTE — Telephone Encounter (Signed)
 Message given to [place]. I also told her ask her call us so we can schedule her ABI appt because we can't get her to answer. She stated that we should call her sister [name].  This message is being transferred up front for the patients sister to be called.

## 2024-01-15 NOTE — Telephone Encounter (Signed)
 LVM for [facility] to call back and schedule pt for ABI

## 2024-01-16 DIAGNOSIS — L89892 Pressure ulcer of other site, stage 2: Secondary | ICD-10-CM | POA: Diagnosis not present

## 2024-01-20 ENCOUNTER — Telehealth: Payer: Self-pay | Admitting: Family

## 2024-01-20 DIAGNOSIS — Z9181 History of falling: Secondary | ICD-10-CM | POA: Diagnosis not present

## 2024-01-20 DIAGNOSIS — D649 Anemia, unspecified: Secondary | ICD-10-CM | POA: Diagnosis not present

## 2024-01-20 DIAGNOSIS — I872 Venous insufficiency (chronic) (peripheral): Secondary | ICD-10-CM | POA: Diagnosis not present

## 2024-01-20 DIAGNOSIS — Z791 Long term (current) use of non-steroidal anti-inflammatories (NSAID): Secondary | ICD-10-CM | POA: Diagnosis not present

## 2024-01-20 DIAGNOSIS — L97422 Non-pressure chronic ulcer of left heel and midfoot with fat layer exposed: Secondary | ICD-10-CM | POA: Diagnosis not present

## 2024-01-20 DIAGNOSIS — Q7271 Split foot, right lower limb: Secondary | ICD-10-CM | POA: Diagnosis not present

## 2024-01-20 DIAGNOSIS — Z7982 Long term (current) use of aspirin: Secondary | ICD-10-CM | POA: Diagnosis not present

## 2024-01-20 DIAGNOSIS — L89892 Pressure ulcer of other site, stage 2: Secondary | ICD-10-CM | POA: Diagnosis not present

## 2024-01-20 DIAGNOSIS — I4891 Unspecified atrial fibrillation: Secondary | ICD-10-CM | POA: Diagnosis not present

## 2024-01-20 DIAGNOSIS — Z7902 Long term (current) use of antithrombotics/antiplatelets: Secondary | ICD-10-CM | POA: Diagnosis not present

## 2024-01-20 DIAGNOSIS — Z556 Problems related to health literacy: Secondary | ICD-10-CM | POA: Diagnosis not present

## 2024-01-20 DIAGNOSIS — I110 Hypertensive heart disease with heart failure: Secondary | ICD-10-CM | POA: Diagnosis not present

## 2024-01-20 DIAGNOSIS — E569 Vitamin deficiency, unspecified: Secondary | ICD-10-CM | POA: Diagnosis not present

## 2024-01-20 DIAGNOSIS — L97812 Non-pressure chronic ulcer of other part of right lower leg with fat layer exposed: Secondary | ICD-10-CM | POA: Diagnosis not present

## 2024-01-20 DIAGNOSIS — I739 Peripheral vascular disease, unspecified: Secondary | ICD-10-CM | POA: Diagnosis not present

## 2024-01-20 DIAGNOSIS — I2510 Atherosclerotic heart disease of native coronary artery without angina pectoris: Secondary | ICD-10-CM | POA: Diagnosis not present

## 2024-01-20 DIAGNOSIS — I5022 Chronic systolic (congestive) heart failure: Secondary | ICD-10-CM | POA: Diagnosis not present
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
## 2024-01-21 ENCOUNTER — Encounter (INDEPENDENT_AMBULATORY_CARE_PROVIDER_SITE_OTHER)

## 2024-01-21 ENCOUNTER — Other Ambulatory Visit: Payer: Self-pay

## 2024-01-21 ENCOUNTER — Telehealth (INDEPENDENT_AMBULATORY_CARE_PROVIDER_SITE_OTHER): Payer: Self-pay

## 2024-01-21 ENCOUNTER — Emergency Department

## 2024-01-21 ENCOUNTER — Emergency Department
Admission: EM | Admit: 2024-01-21 | Discharge: 2024-01-21 | Disposition: A | Discharge status: Home / Self Care | Attending: Emergency Medicine | Admitting: Emergency Medicine

## 2024-01-21 DIAGNOSIS — L089 Local infection of the skin and subcutaneous tissue, unspecified: Secondary | ICD-10-CM | POA: Diagnosis present

## 2024-01-21 DIAGNOSIS — L97525 Non-pressure chronic ulcer of other part of left foot with muscle involvement without evidence of necrosis: Secondary | ICD-10-CM | POA: Insufficient documentation

## 2024-01-21 DIAGNOSIS — I509 Heart failure, unspecified: Secondary | ICD-10-CM | POA: Insufficient documentation

## 2024-01-21 DIAGNOSIS — I110 Hypertensive heart disease with heart failure: Secondary | ICD-10-CM | POA: Diagnosis not present

## 2024-01-21 DIAGNOSIS — I2510 Atherosclerotic heart disease of native coronary artery without angina pectoris: Secondary | ICD-10-CM | POA: Insufficient documentation

## 2024-01-21 DIAGNOSIS — M19072 Primary osteoarthritis, left ankle and foot: Secondary | ICD-10-CM | POA: Diagnosis not present

## 2024-01-21 DIAGNOSIS — M2012 Hallux valgus (acquired), left foot: Secondary | ICD-10-CM | POA: Diagnosis not present

## 2024-01-21 DIAGNOSIS — L97529 Non-pressure chronic ulcer of other part of left foot with unspecified severity: Secondary | ICD-10-CM | POA: Diagnosis not present

## 2024-01-21 LAB — CBC WITH DIFFERENTIAL/PLATELET
Abs Immature Granulocytes: 0.03 10*3/uL (ref 0.00–0.07)
Basophils Absolute: 0 10*3/uL (ref 0.0–0.1)
Basophils Relative: 0 %
Eosinophils Absolute: 0 10*3/uL (ref 0.0–0.5)
Eosinophils Relative: 0 %
HCT: 27.5 % — ABNORMAL LOW (ref 36.0–46.0)
Hemoglobin: 8.7 g/dL — ABNORMAL LOW (ref 12.0–15.0)
Immature Granulocytes: 0 %
Lymphocytes Relative: 9 %
Lymphs Abs: 0.7 10*3/uL (ref 0.7–4.0)
MCH: 30.1 pg (ref 26.0–34.0)
MCHC: 31.6 g/dL (ref 30.0–36.0)
MCV: 95.2 fL (ref 80.0–100.0)
Monocytes Absolute: 0.7 10*3/uL (ref 0.1–1.0)
Monocytes Relative: 10 %
Neutro Abs: 5.9 10*3/uL (ref 1.7–7.7)
Neutrophils Relative %: 81 %
Platelets: 259 10*3/uL (ref 150–400)
RBC: 2.89 MIL/uL — ABNORMAL LOW (ref 3.87–5.11)
RDW: 12.8 % (ref 11.5–15.5)
WBC: 7.3 10*3/uL (ref 4.0–10.5)
nRBC: 0 % (ref 0.0–0.2)

## 2024-01-21 LAB — COMPREHENSIVE METABOLIC PANEL
ALT: 19 U/L (ref 0–44)
AST: 27 U/L (ref 15–41)
Albumin: 3.9 g/dL (ref 3.5–5.0)
Alkaline Phosphatase: 97 U/L (ref 38–126)
Anion gap: 11 (ref 5–15)
BUN: 22 mg/dL (ref 8–23)
CO2: 24 mmol/L (ref 22–32)
Calcium: 8.7 mg/dL — ABNORMAL LOW (ref 8.9–10.3)
Chloride: 98 mmol/L (ref 98–111)
Creatinine, Ser: 1.04 mg/dL — ABNORMAL HIGH (ref 0.44–1.00)
GFR, Estimated: 52 mL/min — ABNORMAL LOW (ref >60)
Glucose, Bld: 104 mg/dL — ABNORMAL HIGH (ref 70–99)
Potassium: 4.8 mmol/L (ref 3.5–5.1)
Sodium: 133 mmol/L — ABNORMAL LOW (ref 135–145)
Total Bilirubin: 0.8 mg/dL (ref 0.0–1.2)
Total Protein: 6.9 g/dL (ref 6.5–8.1)

## 2024-01-21 LAB — LACTIC ACID, PLASMA: Lactic Acid, Venous: 0.8 mmol/L (ref 0.5–1.9)

## 2024-01-21 MED ORDER — DOXYCYCLINE HYCLATE 100 MG PO CAPS: 100.0000 mg | ORAL_CAPSULE | Freq: Two times a day (BID) | ORAL | 0 refills | Status: AC

## 2024-01-21 MED ORDER — DOXYCYCLINE HYCLATE 100 MG PO TABS
100.0000 mg | ORAL_TABLET | Freq: Once | ORAL | Status: AC
  Administered 2024-01-21: 100 mg via ORAL
  Filled 2024-01-21: qty 1

## 2024-01-21 NOTE — Telephone Encounter (Signed)
 She has abis scheduled today but given that extent of her wounds, she is truly going to need wound care to treat
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
## 2024-01-21 NOTE — ED Notes (Signed)
 Dressings applied to patient's L foot. Xeroform placed between first and second digits on L foot.

## 2024-01-21 NOTE — Telephone Encounter (Signed)
 [name] called stating that she was present when [place] came. She would like to make an appt for [facility]. She states she has a huge opening in between her toes, a new wound on her ankle, and blood blisters on her calve measuring 4 in x 3 in.

## 2024-01-21 NOTE — Telephone Encounter (Signed)
 [name] [name] stated she would call wound care.
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
## 2024-01-22 ENCOUNTER — Ambulatory Visit (INDEPENDENT_AMBULATORY_CARE_PROVIDER_SITE_OTHER): Admitting: Podiatry

## 2024-01-22 DIAGNOSIS — L89892 Pressure ulcer of other site, stage 2: Secondary | ICD-10-CM | POA: Diagnosis not present

## 2024-01-22 DIAGNOSIS — I2510 Atherosclerotic heart disease of native coronary artery without angina pectoris: Secondary | ICD-10-CM | POA: Diagnosis not present

## 2024-01-22 DIAGNOSIS — Z7982 Long term (current) use of aspirin: Secondary | ICD-10-CM | POA: Diagnosis not present

## 2024-01-22 DIAGNOSIS — E569 Vitamin deficiency, unspecified: Secondary | ICD-10-CM | POA: Diagnosis not present

## 2024-01-22 DIAGNOSIS — L97522 Non-pressure chronic ulcer of other part of left foot with fat layer exposed: Secondary | ICD-10-CM

## 2024-01-22 DIAGNOSIS — I110 Hypertensive heart disease with heart failure: Secondary | ICD-10-CM | POA: Diagnosis not present

## 2024-01-22 DIAGNOSIS — I4891 Unspecified atrial fibrillation: Secondary | ICD-10-CM | POA: Diagnosis not present

## 2024-01-22 DIAGNOSIS — I739 Peripheral vascular disease, unspecified: Secondary | ICD-10-CM | POA: Diagnosis not present

## 2024-01-22 DIAGNOSIS — Q7271 Split foot, right lower limb: Secondary | ICD-10-CM | POA: Diagnosis not present

## 2024-01-22 DIAGNOSIS — I872 Venous insufficiency (chronic) (peripheral): Secondary | ICD-10-CM | POA: Diagnosis not present

## 2024-01-22 DIAGNOSIS — L97812 Non-pressure chronic ulcer of other part of right lower leg with fat layer exposed: Secondary | ICD-10-CM | POA: Diagnosis not present

## 2024-01-22 DIAGNOSIS — D649 Anemia, unspecified: Secondary | ICD-10-CM | POA: Diagnosis not present

## 2024-01-22 DIAGNOSIS — Z791 Long term (current) use of non-steroidal anti-inflammatories (NSAID): Secondary | ICD-10-CM | POA: Diagnosis not present

## 2024-01-22 DIAGNOSIS — L97422 Non-pressure chronic ulcer of left heel and midfoot with fat layer exposed: Secondary | ICD-10-CM | POA: Diagnosis not present

## 2024-01-22 DIAGNOSIS — Z7902 Long term (current) use of antithrombotics/antiplatelets: Secondary | ICD-10-CM | POA: Diagnosis not present

## 2024-01-22 DIAGNOSIS — Z556 Problems related to health literacy: Secondary | ICD-10-CM | POA: Diagnosis not present

## 2024-01-22 DIAGNOSIS — Z9181 History of falling: Secondary | ICD-10-CM | POA: Diagnosis not present

## 2024-01-22 DIAGNOSIS — I5022 Chronic systolic (congestive) heart failure: Secondary | ICD-10-CM | POA: Diagnosis not present

## 2024-01-22 NOTE — Progress Notes (Signed)
 Subjective:  Patient ID: [name] [name], female    DOB: 08-May-1935,  MRN: [id number]  Chief Complaint  Patient presents with   Wound Check    Left foot wound between toe     89 y.o. female presents with the above complaint.  Patient presents with a new left second digit ulceration limited to the breakdown of the skin painful to touch is progressive gotten worse hurts with ambulation with pressure she wanted get it evaluated.  She does not go to the wound care center   Review of Systems: Negative except as noted in the HPI. Denies N/V/F/Ch.  Past Medical History:  Diagnosis Date   AKI (acute kidney injury) (HCC) 02/20/2023   Atrial fibrillation (HCC)    Basal cell carcinoma 2021   L temple   Chronic back pain    Congestive heart failure (CHF) (HCC)    History of total hip replacement (Bilateral) 01/12/2020   Left hip replacement done in 2019.  Right hip replacement done in 2012.   Hypertension    Hyponatremia 06/11/2020   Melena 02/21/2023   NSTEMI (non-ST elevated myocardial infarction) (HCC) 06/11/2020   Pharmacologic therapy 01/12/2020   Problems influencing health status 01/12/2020   Scoliosis    Upper GI bleeding 02/21/2023    Current Outpatient Medications:    aspirin EC 81 MG tablet, Take 1 tablet (81 mg total) by mouth daily. Swallow whole., Disp: 30 tablet, Rfl: 0   clopidogrel (PLAVIX) 75 MG tablet, TAKE 1 TABLET BY MOUTH ONCE DAILY, Disp: 30 tablet, Rfl: 8   doxycycline (VIBRAMYCIN) 100 MG capsule, Take 1 capsule (100 mg total) by mouth 2 (two) times daily for 14 days., Disp: 28 capsule, Rfl: 0   erythromycin ophthalmic ointment, Place 3.5 g into both eyes as needed., Disp: , Rfl:    feeding supplement (ENSURE ENLIVE / ENSURE PLUS) LIQD, Take 237 mLs by mouth 2 (two) times daily between meals., Disp: 237 mL, Rfl: 12   Ferrous Sulfate (IRON) 325 (65 Fe) MG TABS, TAKE 1 TABLET BY MOUTH ONCE DAILY, Disp: 30 tablet, Rfl: 8   gabapentin (NEURONTIN) 400 MG capsule, TAKE 1  CAPSULE BY MOUTH FOUR TIMES DAILY, Disp: 120 capsule, Rfl: 8   lisinopril (ZESTRIL) 5 MG tablet, Take 5 mg by mouth daily., Disp: , Rfl:    meloxicam (MOBIC) 15 MG tablet, TAKE 1 TABLET BY MOUTH ONCE DAILY, Disp: 30 tablet, Rfl: 8   metoprolol succinate (TOPROL-XL) 25 MG 24 hr tablet, TAKE 1 TABLET BY MOUTH ONCE DAILY, Disp: 30 tablet, Rfl: 8   oxyCODONE-acetaminophen (PERCOCET) 10-325 MG tablet, Take 1 tablet by mouth every 6 (six) hours as needed., Disp: 120 tablet, Rfl: 0   terbinafine (LAMISIL) 250 MG tablet, Take 250 mg by mouth daily., Disp: , Rfl:    valsartan (DIOVAN) 40 MG tablet, Take 1 tablet (40 mg total) by mouth 2 (two) times daily., Disp: 60 tablet, Rfl: 1  Social History   Tobacco Use  Smoking Status Former   Current packs/day: 0.00   Average packs/day: 0.5 packs/day for 68.0 years (34.0 ttl pk-yrs)   Types: Cigarettes   Start date: 06/11/1952   Quit date: 06/11/2020   Years since quitting: 3.6  Smokeless Tobacco Never    Allergies  Allergen Reactions   Spironolactone Anaphylaxis   Fluogen [Influenza Virus Vaccine] Hives   Statins     Dizziness & weakness   Sulfa Antibiotics Other (See Comments)    Unknown, happened as a child   Objective:  There  were no vitals filed for this visit. There is no height or weight on file to calculate BMI. Constitutional Well developed. Well nourished.  Vascular Dorsalis pedis pulses palpable bilaterally. Posterior tibial pulses palpable bilaterally. Capillary refill normal to all digits.  No cyanosis or clubbing noted. Pedal hair growth normal.  Neurologic Normal speech. Oriented to person, place, and time. Epicritic sensation to light touch grossly present bilaterally.  Dermatologic Left second digit ulceration mild macerated periwound no signs of infection noted.  Hypergranular wound noted.  No fibrotic tissue noted.  No purulent drainage noted no signs of infection noted  Orthopedic: Normal joint ROM without pain or crepitus  bilaterally. No visible deformities. No bony tenderness.   Radiographs: None Assessment:  No diagnosis found. Plan:  Patient was evaluated and treated and all questions answered.  Left second digit ulceration fat layer exposed -All questions and concerns were discussed with the patient in extensive detail -Given the amount of tissue that is present patient will benefit from wound care center referral to advance wound care to help heal this quickly.  Patient would like to get into a facility once the wounds have been healed. -Shoe gear modification discussed  No follow-ups on file.

## 2024-01-23 ENCOUNTER — Encounter (INDEPENDENT_AMBULATORY_CARE_PROVIDER_SITE_OTHER)

## 2024-01-24 NOTE — Assessment & Plan Note (Signed)
Continue current wound care regimen.  Will defer to wound center/vascular for further changes to POC.   Recheck at f/u

## 2024-01-24 NOTE — Assessment & Plan Note (Signed)
 Patient stable.  Well controlled with current therapy.   Continue current meds.

## 2024-01-24 NOTE — Assessment & Plan Note (Signed)
>>  ASSESSMENT AND PLAN FOR UNDERWEIGHT WRITTEN ON 01/24/2024  5:47 PM BY Evander Hills M, FNP  Patient has been trying to make an effort to increase her nutritional intake.  Suggest supplementary shakes or something similar.

## 2024-01-24 NOTE — Assessment & Plan Note (Signed)
 Patient sees cardiology, they have been adjusting her meds.  Will defer to them for further changes.

## 2024-01-24 NOTE — Assessment & Plan Note (Signed)
 Patient has been trying to make an effort to increase her nutritional intake.  Suggest supplementary shakes or something similar.

## 2024-01-27 DIAGNOSIS — E569 Vitamin deficiency, unspecified: Secondary | ICD-10-CM | POA: Diagnosis not present

## 2024-01-27 DIAGNOSIS — I4891 Unspecified atrial fibrillation: Secondary | ICD-10-CM | POA: Diagnosis not present

## 2024-01-27 DIAGNOSIS — I5022 Chronic systolic (congestive) heart failure: Secondary | ICD-10-CM | POA: Diagnosis not present

## 2024-01-27 DIAGNOSIS — I2510 Atherosclerotic heart disease of native coronary artery without angina pectoris: Secondary | ICD-10-CM | POA: Diagnosis not present

## 2024-01-27 DIAGNOSIS — I739 Peripheral vascular disease, unspecified: Secondary | ICD-10-CM | POA: Diagnosis not present

## 2024-01-27 DIAGNOSIS — D649 Anemia, unspecified: Secondary | ICD-10-CM | POA: Diagnosis not present

## 2024-01-27 DIAGNOSIS — I110 Hypertensive heart disease with heart failure: Secondary | ICD-10-CM | POA: Diagnosis not present

## 2024-01-27 DIAGNOSIS — Q7271 Split foot, right lower limb: Secondary | ICD-10-CM | POA: Diagnosis not present

## 2024-01-27 DIAGNOSIS — L97422 Non-pressure chronic ulcer of left heel and midfoot with fat layer exposed: Secondary | ICD-10-CM | POA: Diagnosis not present

## 2024-01-27 DIAGNOSIS — L97812 Non-pressure chronic ulcer of other part of right lower leg with fat layer exposed: Secondary | ICD-10-CM | POA: Diagnosis not present

## 2024-01-27 DIAGNOSIS — I872 Venous insufficiency (chronic) (peripheral): Secondary | ICD-10-CM | POA: Diagnosis not present

## 2024-01-27 DIAGNOSIS — Z7982 Long term (current) use of aspirin: Secondary | ICD-10-CM | POA: Diagnosis not present

## 2024-01-27 DIAGNOSIS — Z791 Long term (current) use of non-steroidal anti-inflammatories (NSAID): Secondary | ICD-10-CM | POA: Diagnosis not present

## 2024-01-27 DIAGNOSIS — Z556 Problems related to health literacy: Secondary | ICD-10-CM | POA: Diagnosis not present

## 2024-01-27 DIAGNOSIS — Z9181 History of falling: Secondary | ICD-10-CM | POA: Diagnosis not present

## 2024-01-27 DIAGNOSIS — L89892 Pressure ulcer of other site, stage 2: Secondary | ICD-10-CM | POA: Diagnosis not present

## 2024-01-27 DIAGNOSIS — Z7902 Long term (current) use of antithrombotics/antiplatelets: Secondary | ICD-10-CM | POA: Diagnosis not present

## 2024-01-28 ENCOUNTER — Ambulatory Visit (INDEPENDENT_AMBULATORY_CARE_PROVIDER_SITE_OTHER): Payer: 59 | Admitting: Family

## 2024-01-28 ENCOUNTER — Telehealth (INDEPENDENT_AMBULATORY_CARE_PROVIDER_SITE_OTHER): Payer: Self-pay

## 2024-01-28 ENCOUNTER — Encounter: Payer: Self-pay | Admitting: Family

## 2024-01-28 VITALS — BP 166/88 | HR 77 | Ht 66.0 in | Wt 95.6 lb

## 2024-01-28 DIAGNOSIS — I480 Paroxysmal atrial fibrillation: Secondary | ICD-10-CM

## 2024-01-28 DIAGNOSIS — J069 Acute upper respiratory infection, unspecified: Secondary | ICD-10-CM | POA: Diagnosis not present

## 2024-01-28 DIAGNOSIS — I5022 Chronic systolic (congestive) heart failure: Secondary | ICD-10-CM | POA: Diagnosis not present

## 2024-01-28 DIAGNOSIS — L89892 Pressure ulcer of other site, stage 2: Secondary | ICD-10-CM | POA: Diagnosis not present

## 2024-01-28 LAB — POC INFLUENZA A&B (BINAX/QUICKVUE)
Influenza A, POC: NEGATIVE
Influenza B, POC: NEGATIVE

## 2024-01-28 MED ORDER — ONDANSETRON 4 MG PO TBDP: 4.0000 mg | ORAL_TABLET | Freq: Three times a day (TID) | ORAL | 0 refills | Status: AC | PRN

## 2024-01-28 NOTE — Telephone Encounter (Signed)
 [name] [name] called requesting Wound care orders for bilateral lower legs. [name] [name] stated that "Her blisters popped and its a bloody mess." She placed steri strips on them, she needs after cleanser, xeroform, non- stick gauzes and dressing, telfa, and kerlix  2x a day.  Need fax @ [phone]   Per Vivia Birmingham- approves orders but only verbal because patient ios coming in tomorrow and her wc may change.

## 2024-01-29 ENCOUNTER — Other Ambulatory Visit: Payer: Self-pay

## 2024-01-29 ENCOUNTER — Emergency Department

## 2024-01-29 ENCOUNTER — Telehealth (INDEPENDENT_AMBULATORY_CARE_PROVIDER_SITE_OTHER): Payer: Self-pay | Admitting: Nurse Practitioner

## 2024-01-29 ENCOUNTER — Emergency Department
Admission: EM | Admit: 2024-01-29 | Discharge: 2024-01-29 | Disposition: A | Discharge status: Home / Self Care | Attending: Emergency Medicine | Admitting: Emergency Medicine

## 2024-01-29 ENCOUNTER — Ambulatory Visit (INDEPENDENT_AMBULATORY_CARE_PROVIDER_SITE_OTHER): Admitting: Nurse Practitioner

## 2024-01-29 DIAGNOSIS — Z743 Need for continuous supervision: Secondary | ICD-10-CM | POA: Diagnosis not present

## 2024-01-29 DIAGNOSIS — I2510 Atherosclerotic heart disease of native coronary artery without angina pectoris: Secondary | ICD-10-CM | POA: Diagnosis not present

## 2024-01-29 DIAGNOSIS — R404 Transient alteration of awareness: Secondary | ICD-10-CM | POA: Diagnosis not present

## 2024-01-29 DIAGNOSIS — I482 Chronic atrial fibrillation, unspecified: Secondary | ICD-10-CM | POA: Diagnosis not present

## 2024-01-29 DIAGNOSIS — R42 Dizziness and giddiness: Secondary | ICD-10-CM | POA: Insufficient documentation

## 2024-01-29 DIAGNOSIS — R55 Syncope and collapse: Secondary | ICD-10-CM | POA: Diagnosis not present

## 2024-01-29 DIAGNOSIS — I499 Cardiac arrhythmia, unspecified: Secondary | ICD-10-CM | POA: Diagnosis not present

## 2024-01-29 DIAGNOSIS — R0902 Hypoxemia: Secondary | ICD-10-CM | POA: Diagnosis not present

## 2024-01-29 DIAGNOSIS — I509 Heart failure, unspecified: Secondary | ICD-10-CM | POA: Diagnosis not present

## 2024-01-29 DIAGNOSIS — R6889 Other general symptoms and signs: Secondary | ICD-10-CM | POA: Diagnosis not present

## 2024-01-29 LAB — TROPONIN I (HIGH SENSITIVITY): Troponin I (High Sensitivity): 16 ng/L (ref <18)

## 2024-01-29 LAB — PROTIME-INR
INR: 1.4 — ABNORMAL HIGH (ref 0.8–1.2)
Prothrombin Time: 17.4 s — ABNORMAL HIGH (ref 11.4–15.2)

## 2024-01-29 LAB — CBC
HCT: 31.9 % — ABNORMAL LOW (ref 36.0–46.0)
Hemoglobin: 10.1 g/dL — ABNORMAL LOW (ref 12.0–15.0)
MCH: 30 pg (ref 26.0–34.0)
MCHC: 31.7 g/dL (ref 30.0–36.0)
MCV: 94.7 fL (ref 80.0–100.0)
Platelets: 311 10*3/uL (ref 150–400)
RBC: 3.37 MIL/uL — ABNORMAL LOW (ref 3.87–5.11)
RDW: 12.5 % (ref 11.5–15.5)
WBC: 5.5 10*3/uL (ref 4.0–10.5)
nRBC: 0 % (ref 0.0–0.2)

## 2024-01-29 LAB — BASIC METABOLIC PANEL
Anion gap: 8 (ref 5–15)
BUN: 41 mg/dL — ABNORMAL HIGH (ref 8–23)
CO2: 22 mmol/L (ref 22–32)
Calcium: 8.3 mg/dL — ABNORMAL LOW (ref 8.9–10.3)
Chloride: 105 mmol/L (ref 98–111)
Creatinine, Ser: 1.34 mg/dL — ABNORMAL HIGH (ref 0.44–1.00)
GFR, Estimated: 38 mL/min — ABNORMAL LOW (ref >60)
Glucose, Bld: 156 mg/dL — ABNORMAL HIGH (ref 70–99)
Potassium: 4.3 mmol/L (ref 3.5–5.1)
Sodium: 135 mmol/L (ref 135–145)

## 2024-01-29 NOTE — ED Notes (Signed)
 This tech and Chief of Staff [employer] students cleaned pt up after bowel incontinence. New brief and chux pad were placed. Pt resting in stretcher at this time with call bell in reach.

## 2024-01-29 NOTE — Discharge Instructions (Signed)
 You were seen in the ER for lightheadedness.  Your testing fortunately did not show an emergency cause for this.  Follow with your primary care doctor for further evaluation.  Return to the ER for new or worsening symptoms.

## 2024-01-29 NOTE — ED Provider Notes (Signed)
 [facility] Provider Note    Event Date/Time   First MD Initiated Contact with Patient 01/29/24 1104     (approximate)   History   Near Syncope   HPI  [name] [name] is a 89 year old female with history of chronic A-fib, CAD, CHF presenting to the emergency department for evaluation of lightheadedness.  Patient was shopping at [facility] when she was noted to appear pale and a friend called EMS.  With EMS she was noted to have a drop in her blood pressure to the 60s when standing.  No syncope.  There was concern about an abnormal heart rhythm on EMS EKG.  I did review this with EMS.  On my review, this does appear to be an irregularly irregular rhythm consistent with A-fib.  I do not see evidence of heart block.  Patient tells me that she got into an argument with her sister yesterday, so she has not had much to eat or drink over the past day and a half.  She was on the way to the store to buy food.  Feels improved now, is hungry, is eager to be discharged home.     Physical Exam   Triage Vital Signs: ED Triage Vitals  Encounter Vitals Group     BP 01/29/24 1111 (!) 163/97     Systolic BP Percentile --      Diastolic BP Percentile --      Pulse Rate 01/29/24 1111 77     Resp 01/29/24 1111 13     Temp 01/29/24 1111 (!) 97.5 F (36.4 C)     Temp Source 01/29/24 1111 Oral     SpO2 01/29/24 1111 100 %     Weight 01/29/24 1106 95 lb 9.6 oz (43.4 kg)     Height 01/29/24 1106 5\' 6"  (1.676 m)     Head Circumference --      Peak Flow --      Pain Score 01/29/24 1106 0     Pain Loc --      Pain Education --      Exclude from Growth Chart --     Most recent vital signs: Vitals:   01/29/24 1345 01/29/24 1400  BP: (!) 140/119 (!) 184/160  Pulse: 77 73  Resp: 12 13  Temp:    SpO2: 100% 100%     General: Awake, interactive  CV:  Regular rate, good peripheral perfusion.  Resp:  Unlabored respirations, lungs clear to auscultation Abd:  Nondistended, soft,  nontender to palpation Neuro:  Symmetric facial movement, fluid speech, 5-5 strength in bilateral upper and lower extremities   ED Results / Procedures / Treatments   Labs (all labs ordered are listed, but only abnormal results are displayed) Labs Reviewed  BASIC METABOLIC PANEL - Abnormal; Notable for the following components:      Result Value   Glucose, Bld 156 (*)    BUN 41 (*)    Creatinine, Ser 1.34 (*)    Calcium 8.3 (*)    GFR, Estimated 38 (*)    All other components within normal limits  CBC - Abnormal; Notable for the following components:   RBC 3.37 (*)    Hemoglobin 10.1 (*)    HCT 31.9 (*)    All other components within normal limits  PROTIME-INR - Abnormal; Notable for the following components:   Prothrombin Time 17.4 (*)    INR 1.4 (*)    All other components within normal limits  TROPONIN I (  HIGH SENSITIVITY)     EKG EKG independently reviewed interpreted by myself (ER attending) demonstrates:  EKG demonstrates what is documented A-fib versus sinus rhythm with PACs at a rate of 75, PR 238, QRS 93, QTc 448, no acute ST changes  RADIOLOGY Imaging independently reviewed and interpreted by myself demonstrates:  CXR without focal consolidation  PROCEDURES:  Critical Care performed: No  Procedures   MEDICATIONS ORDERED IN ED: Medications - No data to display   IMPRESSION / MDM / ASSESSMENT AND PLAN / ED COURSE  I reviewed the triage vital signs and the nursing notes.  Differential diagnosis includes, but is not limited to, arrhythmia anemia, electrolyte abnormality, vasovagal lightheadedness in setting of dehydration, poor p.o. intake  Patient's presentation is most consistent with acute presentation with potential threat to life or bodily function.  89 year old female presenting with lightheadedness.  Reportedly initially hypotensive with EMS, stable to hypertensive on presentation here.  Labs with stable anemia, slightly worsened renal dysfunction  with creatinine of 1.34.  Negative troponin.  EKG and x-Jenica Costilow reassuring.  Patient monitored here without recurrent hypotension or lightheadedness.  She was reassessed and remains feeling improved, eager to be discharged home.  With her clinical history Mo suggestive of orthostatic lightheadedness and her tolerating p.o. intake here, do think this is reasonable.  Strict return precautions provided.  Patient discharged stable condition.      FINAL CLINICAL IMPRESSION(S) / ED DIAGNOSES   Final diagnoses:  Near syncope     Rx / DC Orders   ED Discharge Orders     None        Note:  This document was prepared using Dragon voice recognition software and may include unintentional dictation errors.   Trinna Post, MD 01/29/24 [phone]

## 2024-01-29 NOTE — ED Triage Notes (Signed)
 Pt arrives via EMS from shopping at [facility]. Per EMS pt BP was 90 sys while pt was sitting however when EMS stood pt up, pt bp dropped to the 60's sys. Pt sts that she is fine and she just needs to eat. Per EMS pt has been going in and out of 2nd degree type 1 heart block. Pt is A/Ox4 resting on stretcher.

## 2024-01-29 NOTE — Telephone Encounter (Signed)
 Pt sister [name] called inquiring about pt appt for today at 1:45 pt sister states she can not find pt or get in touch with her and asked if we had seen her. Pt chart showed that she was hospitalized, this information was not disclose with the pts sister. She was informed we did not have the information she needed and would transfer her to someone who would, pt sister was transferred to the information desk.

## 2024-01-30 DIAGNOSIS — I2510 Atherosclerotic heart disease of native coronary artery without angina pectoris: Secondary | ICD-10-CM | POA: Diagnosis not present

## 2024-01-30 DIAGNOSIS — Q7271 Split foot, right lower limb: Secondary | ICD-10-CM | POA: Diagnosis not present

## 2024-01-30 DIAGNOSIS — I5022 Chronic systolic (congestive) heart failure: Secondary | ICD-10-CM | POA: Diagnosis not present

## 2024-01-30 DIAGNOSIS — Z556 Problems related to health literacy: Secondary | ICD-10-CM | POA: Diagnosis not present

## 2024-01-30 DIAGNOSIS — I110 Hypertensive heart disease with heart failure: Secondary | ICD-10-CM | POA: Diagnosis not present

## 2024-01-30 DIAGNOSIS — Z9181 History of falling: Secondary | ICD-10-CM | POA: Diagnosis not present

## 2024-01-30 DIAGNOSIS — Z791 Long term (current) use of non-steroidal anti-inflammatories (NSAID): Secondary | ICD-10-CM | POA: Diagnosis not present

## 2024-01-30 DIAGNOSIS — E569 Vitamin deficiency, unspecified: Secondary | ICD-10-CM | POA: Diagnosis not present

## 2024-01-30 DIAGNOSIS — Z7982 Long term (current) use of aspirin: Secondary | ICD-10-CM | POA: Diagnosis not present

## 2024-01-30 DIAGNOSIS — L97422 Non-pressure chronic ulcer of left heel and midfoot with fat layer exposed: Secondary | ICD-10-CM | POA: Diagnosis not present

## 2024-01-30 DIAGNOSIS — L89892 Pressure ulcer of other site, stage 2: Secondary | ICD-10-CM | POA: Diagnosis not present

## 2024-01-30 DIAGNOSIS — I739 Peripheral vascular disease, unspecified: Secondary | ICD-10-CM | POA: Diagnosis not present

## 2024-01-30 DIAGNOSIS — L97812 Non-pressure chronic ulcer of other part of right lower leg with fat layer exposed: Secondary | ICD-10-CM | POA: Diagnosis not present

## 2024-01-30 DIAGNOSIS — D649 Anemia, unspecified: Secondary | ICD-10-CM | POA: Diagnosis not present

## 2024-01-30 DIAGNOSIS — I4891 Unspecified atrial fibrillation: Secondary | ICD-10-CM | POA: Diagnosis not present

## 2024-01-30 DIAGNOSIS — Z7902 Long term (current) use of antithrombotics/antiplatelets: Secondary | ICD-10-CM | POA: Diagnosis not present

## 2024-01-30 DIAGNOSIS — I872 Venous insufficiency (chronic) (peripheral): Secondary | ICD-10-CM | POA: Diagnosis not present

## 2024-02-02 ENCOUNTER — Encounter: Payer: Self-pay | Admitting: Radiation Oncology

## 2024-02-02 ENCOUNTER — Telehealth (INDEPENDENT_AMBULATORY_CARE_PROVIDER_SITE_OTHER): Payer: Self-pay

## 2024-02-02 ENCOUNTER — Telehealth: Payer: Self-pay | Admitting: Family

## 2024-02-02 ENCOUNTER — Ambulatory Visit
Admission: RE | Admit: 2024-02-02 | Discharge: 2024-02-02 | Disposition: A | Discharge status: Home / Self Care | Payer: 59 | Source: Ambulatory Visit | Attending: Radiation Oncology | Admitting: Radiation Oncology

## 2024-02-02 VITALS — BP 162/67 | HR 80 | Resp 17 | Wt 87.3 lb

## 2024-02-02 DIAGNOSIS — C44310 Basal cell carcinoma of skin of unspecified parts of face: Secondary | ICD-10-CM | POA: Diagnosis not present

## 2024-02-02 NOTE — Telephone Encounter (Signed)
 Harriett Sine, nurse with [facility], left VM requesting a verbal for a evaluation for social work. States that patient's sister is wanting to get resources to get the patient in an assisted living.  Callback # [phone]    Called Harriett Sine back and gave verbal okay for social work eval.

## 2024-02-02 NOTE — Progress Notes (Signed)
 Radiation Oncology Follow up Note  Name: [name] [name]   Date:   02/02/2024 MRN:  [id number] DOB: [birth year]    This 89 y.o. female presents to the clinic today for 1 month follow-up status post electron-beam therapy to her right cheek as well as right bridge of her nose for basal cell carcinoma.  REFERRING PROVIDER: Miki Kins, FNP  HPI: Patient is an 89 year old female now out 1 month having completed electron-beam therapy to her right cheek as well as right bridge of her nose for basal cell carcinoma.  She is clinically not doing well accompanied by her sister who reports that she is frail losing weight.  She has been to her PMD last week although they are not happy with her current state of care and declining health issues.  All of this is unrelated to her electron-beam therapy to her face which shows excellent and complete response.  COMPLICATIONS OF TREATMENT: none  FOLLOW UP COMPLIANCE: keeps appointments   PHYSICAL EXAM:  BP (!) 162/67   Pulse 80   Resp 17   Wt 87 lb 4.8 oz (39.6 kg)   SpO2 92%   BMI 14.09 kg/m  Both areas of radiation on the right bridge of nose and right cheek she will complete response with no evidence of disease.  No evidence of adenopathy in the head and neck region is noted.  RADIOLOGY RESULTS: No current films for review  PLAN: Present time patient has a complete response to radiation therapy.  I am concerned follow-up care over back to her dermatologist for her skin issues.  Also will try to arrange her to see symptom management our department for some of her overall declining health.  Patient and sister know to call with any concerns at any time.  I would like to take this opportunity to thank you for allowing me to participate in the care of your patient.Carmina Miller, MD

## 2024-02-02 NOTE — Telephone Encounter (Signed)
 Harriett Sine is calling for WC orders for [place]. She stated that the patient had fell and went to the hospital on the day she had an appointment. She stated that she has been using Xeroform for the skin tears and blood drainage that she has been having since the bloody blisters popped. She would like to have the orders fax over to [phone].   Please advise

## 2024-02-03 DIAGNOSIS — Z9181 History of falling: Secondary | ICD-10-CM | POA: Diagnosis not present

## 2024-02-03 DIAGNOSIS — I4891 Unspecified atrial fibrillation: Secondary | ICD-10-CM | POA: Diagnosis not present

## 2024-02-03 DIAGNOSIS — I872 Venous insufficiency (chronic) (peripheral): Secondary | ICD-10-CM | POA: Diagnosis not present

## 2024-02-03 DIAGNOSIS — L97812 Non-pressure chronic ulcer of other part of right lower leg with fat layer exposed: Secondary | ICD-10-CM | POA: Diagnosis not present

## 2024-02-03 DIAGNOSIS — Q7271 Split foot, right lower limb: Secondary | ICD-10-CM | POA: Diagnosis not present

## 2024-02-03 DIAGNOSIS — L97422 Non-pressure chronic ulcer of left heel and midfoot with fat layer exposed: Secondary | ICD-10-CM | POA: Diagnosis not present

## 2024-02-03 DIAGNOSIS — L89892 Pressure ulcer of other site, stage 2: Secondary | ICD-10-CM | POA: Diagnosis not present

## 2024-02-03 DIAGNOSIS — I5022 Chronic systolic (congestive) heart failure: Secondary | ICD-10-CM | POA: Diagnosis not present

## 2024-02-03 DIAGNOSIS — I2510 Atherosclerotic heart disease of native coronary artery without angina pectoris: Secondary | ICD-10-CM | POA: Diagnosis not present

## 2024-02-03 DIAGNOSIS — Z791 Long term (current) use of non-steroidal anti-inflammatories (NSAID): Secondary | ICD-10-CM | POA: Diagnosis not present

## 2024-02-03 DIAGNOSIS — Z7902 Long term (current) use of antithrombotics/antiplatelets: Secondary | ICD-10-CM | POA: Diagnosis not present

## 2024-02-03 DIAGNOSIS — I739 Peripheral vascular disease, unspecified: Secondary | ICD-10-CM | POA: Diagnosis not present

## 2024-02-03 DIAGNOSIS — Z556 Problems related to health literacy: Secondary | ICD-10-CM | POA: Diagnosis not present

## 2024-02-03 DIAGNOSIS — Z7982 Long term (current) use of aspirin: Secondary | ICD-10-CM | POA: Diagnosis not present

## 2024-02-03 DIAGNOSIS — D649 Anemia, unspecified: Secondary | ICD-10-CM | POA: Diagnosis not present

## 2024-02-03 DIAGNOSIS — I110 Hypertensive heart disease with heart failure: Secondary | ICD-10-CM | POA: Diagnosis not present

## 2024-02-03 DIAGNOSIS — E569 Vitamin deficiency, unspecified: Secondary | ICD-10-CM | POA: Diagnosis not present

## 2024-02-04 ENCOUNTER — Ambulatory Visit
Admission: RE | Admit: 2024-02-04 | Discharge: 2024-02-04 | Disposition: A | Discharge status: Home / Self Care | Attending: Family | Admitting: Family

## 2024-02-04 ENCOUNTER — Ambulatory Visit: Admitting: Family

## 2024-02-04 ENCOUNTER — Ambulatory Visit
Admission: RE | Admit: 2024-02-04 | Discharge: 2024-02-04 | Disposition: A | Discharge status: Home / Self Care | Source: Ambulatory Visit | Attending: Family | Admitting: Family

## 2024-02-04 ENCOUNTER — Encounter: Payer: Self-pay | Admitting: Family

## 2024-02-04 VITALS — BP 142/82 | HR 69 | Ht 66.0 in | Wt 88.0 lb

## 2024-02-04 DIAGNOSIS — M5442 Lumbago with sciatica, left side: Secondary | ICD-10-CM | POA: Diagnosis not present

## 2024-02-04 DIAGNOSIS — G8929 Other chronic pain: Secondary | ICD-10-CM | POA: Diagnosis not present

## 2024-02-04 DIAGNOSIS — I872 Venous insufficiency (chronic) (peripheral): Secondary | ICD-10-CM | POA: Diagnosis not present

## 2024-02-04 DIAGNOSIS — I5022 Chronic systolic (congestive) heart failure: Secondary | ICD-10-CM | POA: Diagnosis not present

## 2024-02-04 DIAGNOSIS — M79605 Pain in left leg: Secondary | ICD-10-CM | POA: Diagnosis not present

## 2024-02-04 DIAGNOSIS — I96 Gangrene, not elsewhere classified: Secondary | ICD-10-CM

## 2024-02-04 DIAGNOSIS — M5441 Lumbago with sciatica, right side: Secondary | ICD-10-CM

## 2024-02-04 DIAGNOSIS — M546 Pain in thoracic spine: Secondary | ICD-10-CM | POA: Diagnosis not present

## 2024-02-04 DIAGNOSIS — M40204 Unspecified kyphosis, thoracic region: Secondary | ICD-10-CM | POA: Diagnosis not present

## 2024-02-04 DIAGNOSIS — E43 Unspecified severe protein-calorie malnutrition: Secondary | ICD-10-CM | POA: Diagnosis not present

## 2024-02-04 DIAGNOSIS — M79604 Pain in right leg: Secondary | ICD-10-CM | POA: Diagnosis not present

## 2024-02-04 DIAGNOSIS — R636 Underweight: Secondary | ICD-10-CM

## 2024-02-04 DIAGNOSIS — Z0130 Encounter for examination of blood pressure without abnormal findings: Secondary | ICD-10-CM

## 2024-02-06 DIAGNOSIS — D649 Anemia, unspecified: Secondary | ICD-10-CM | POA: Diagnosis not present

## 2024-02-06 DIAGNOSIS — I2510 Atherosclerotic heart disease of native coronary artery without angina pectoris: Secondary | ICD-10-CM | POA: Diagnosis not present

## 2024-02-06 DIAGNOSIS — E569 Vitamin deficiency, unspecified: Secondary | ICD-10-CM | POA: Diagnosis not present

## 2024-02-06 DIAGNOSIS — I739 Peripheral vascular disease, unspecified: Secondary | ICD-10-CM | POA: Diagnosis not present

## 2024-02-06 DIAGNOSIS — Z7902 Long term (current) use of antithrombotics/antiplatelets: Secondary | ICD-10-CM | POA: Diagnosis not present

## 2024-02-06 DIAGNOSIS — Z791 Long term (current) use of non-steroidal anti-inflammatories (NSAID): Secondary | ICD-10-CM | POA: Diagnosis not present

## 2024-02-06 DIAGNOSIS — L89892 Pressure ulcer of other site, stage 2: Secondary | ICD-10-CM | POA: Diagnosis not present

## 2024-02-06 DIAGNOSIS — L97812 Non-pressure chronic ulcer of other part of right lower leg with fat layer exposed: Secondary | ICD-10-CM | POA: Diagnosis not present

## 2024-02-06 DIAGNOSIS — I872 Venous insufficiency (chronic) (peripheral): Secondary | ICD-10-CM | POA: Diagnosis not present

## 2024-02-06 DIAGNOSIS — Z556 Problems related to health literacy: Secondary | ICD-10-CM | POA: Diagnosis not present

## 2024-02-06 DIAGNOSIS — I4891 Unspecified atrial fibrillation: Secondary | ICD-10-CM | POA: Diagnosis not present

## 2024-02-06 DIAGNOSIS — Z7982 Long term (current) use of aspirin: Secondary | ICD-10-CM | POA: Diagnosis not present

## 2024-02-06 DIAGNOSIS — L97422 Non-pressure chronic ulcer of left heel and midfoot with fat layer exposed: Secondary | ICD-10-CM | POA: Diagnosis not present

## 2024-02-06 DIAGNOSIS — Q7271 Split foot, right lower limb: Secondary | ICD-10-CM | POA: Diagnosis not present

## 2024-02-06 DIAGNOSIS — Z9181 History of falling: Secondary | ICD-10-CM | POA: Diagnosis not present

## 2024-02-06 DIAGNOSIS — I5022 Chronic systolic (congestive) heart failure: Secondary | ICD-10-CM | POA: Diagnosis not present

## 2024-02-06 DIAGNOSIS — I110 Hypertensive heart disease with heart failure: Secondary | ICD-10-CM | POA: Diagnosis not present

## 2024-02-09 ENCOUNTER — Telehealth: Payer: Self-pay | Admitting: *Deleted

## 2024-02-09 ENCOUNTER — Telehealth: Payer: Self-pay

## 2024-02-09 DIAGNOSIS — Z7982 Long term (current) use of aspirin: Secondary | ICD-10-CM | POA: Diagnosis not present

## 2024-02-09 DIAGNOSIS — Z7902 Long term (current) use of antithrombotics/antiplatelets: Secondary | ICD-10-CM | POA: Diagnosis not present

## 2024-02-09 DIAGNOSIS — Z556 Problems related to health literacy: Secondary | ICD-10-CM | POA: Diagnosis not present

## 2024-02-09 DIAGNOSIS — I5022 Chronic systolic (congestive) heart failure: Secondary | ICD-10-CM | POA: Diagnosis not present

## 2024-02-09 DIAGNOSIS — L89892 Pressure ulcer of other site, stage 2: Secondary | ICD-10-CM | POA: Diagnosis not present

## 2024-02-09 DIAGNOSIS — E569 Vitamin deficiency, unspecified: Secondary | ICD-10-CM | POA: Diagnosis not present

## 2024-02-09 DIAGNOSIS — L97422 Non-pressure chronic ulcer of left heel and midfoot with fat layer exposed: Secondary | ICD-10-CM | POA: Diagnosis not present

## 2024-02-09 DIAGNOSIS — Q7271 Split foot, right lower limb: Secondary | ICD-10-CM | POA: Diagnosis not present

## 2024-02-09 DIAGNOSIS — Z9181 History of falling: Secondary | ICD-10-CM | POA: Diagnosis not present

## 2024-02-09 DIAGNOSIS — I2510 Atherosclerotic heart disease of native coronary artery without angina pectoris: Secondary | ICD-10-CM | POA: Diagnosis not present

## 2024-02-09 DIAGNOSIS — Z791 Long term (current) use of non-steroidal anti-inflammatories (NSAID): Secondary | ICD-10-CM | POA: Diagnosis not present

## 2024-02-09 DIAGNOSIS — I4891 Unspecified atrial fibrillation: Secondary | ICD-10-CM | POA: Diagnosis not present

## 2024-02-09 DIAGNOSIS — I739 Peripheral vascular disease, unspecified: Secondary | ICD-10-CM | POA: Diagnosis not present

## 2024-02-09 DIAGNOSIS — L97812 Non-pressure chronic ulcer of other part of right lower leg with fat layer exposed: Secondary | ICD-10-CM | POA: Diagnosis not present

## 2024-02-09 DIAGNOSIS — I110 Hypertensive heart disease with heart failure: Secondary | ICD-10-CM | POA: Diagnosis not present

## 2024-02-09 DIAGNOSIS — I872 Venous insufficiency (chronic) (peripheral): Secondary | ICD-10-CM | POA: Diagnosis not present

## 2024-02-09 DIAGNOSIS — D649 Anemia, unspecified: Secondary | ICD-10-CM | POA: Diagnosis not present

## 2024-02-09 NOTE — Telephone Encounter (Signed)
 I called the sister and the she goes in and out so not sure if she got a call. They saw Chrystal and said she is good . It is probably the pt. Did not get a call. If anything else come up we will call back . [name] is ok with this

## 2024-02-09 NOTE — Telephone Encounter (Signed)
 Patients sister called stating that she told their cousin she is unable to get up although she can, she said she had not eaten but she has and mary made her meals, she has altered mental status per [name], spoke with [name] and she said for pt sister to bring her to do a UA to make sure she doesn't have a UTI, I tried calling mary back and had to leave a VM, I did inform mary on the VM to bring the patient in tomorrow for a UA only on the nurses schedule so we can rule that out, will try calling her again in the morning.

## 2024-02-10 ENCOUNTER — Ambulatory Visit (INDEPENDENT_AMBULATORY_CARE_PROVIDER_SITE_OTHER): Admitting: Family

## 2024-02-10 DIAGNOSIS — Z556 Problems related to health literacy: Secondary | ICD-10-CM | POA: Diagnosis not present

## 2024-02-10 DIAGNOSIS — I872 Venous insufficiency (chronic) (peripheral): Secondary | ICD-10-CM | POA: Diagnosis not present

## 2024-02-10 DIAGNOSIS — L97422 Non-pressure chronic ulcer of left heel and midfoot with fat layer exposed: Secondary | ICD-10-CM | POA: Diagnosis not present

## 2024-02-10 DIAGNOSIS — Z9181 History of falling: Secondary | ICD-10-CM | POA: Diagnosis not present

## 2024-02-10 DIAGNOSIS — L97812 Non-pressure chronic ulcer of other part of right lower leg with fat layer exposed: Secondary | ICD-10-CM | POA: Diagnosis not present

## 2024-02-10 DIAGNOSIS — Z7902 Long term (current) use of antithrombotics/antiplatelets: Secondary | ICD-10-CM | POA: Diagnosis not present

## 2024-02-10 DIAGNOSIS — L89892 Pressure ulcer of other site, stage 2: Secondary | ICD-10-CM | POA: Diagnosis not present

## 2024-02-10 DIAGNOSIS — I5022 Chronic systolic (congestive) heart failure: Secondary | ICD-10-CM | POA: Diagnosis not present

## 2024-02-10 DIAGNOSIS — Q7271 Split foot, right lower limb: Secondary | ICD-10-CM | POA: Diagnosis not present

## 2024-02-10 DIAGNOSIS — Z791 Long term (current) use of non-steroidal anti-inflammatories (NSAID): Secondary | ICD-10-CM | POA: Diagnosis not present

## 2024-02-10 DIAGNOSIS — D649 Anemia, unspecified: Secondary | ICD-10-CM | POA: Diagnosis not present

## 2024-02-10 DIAGNOSIS — M545 Low back pain, unspecified: Secondary | ICD-10-CM | POA: Diagnosis not present

## 2024-02-10 DIAGNOSIS — I4891 Unspecified atrial fibrillation: Secondary | ICD-10-CM | POA: Diagnosis not present

## 2024-02-10 DIAGNOSIS — I110 Hypertensive heart disease with heart failure: Secondary | ICD-10-CM | POA: Diagnosis not present

## 2024-02-10 DIAGNOSIS — I739 Peripheral vascular disease, unspecified: Secondary | ICD-10-CM | POA: Diagnosis not present

## 2024-02-10 DIAGNOSIS — E569 Vitamin deficiency, unspecified: Secondary | ICD-10-CM | POA: Diagnosis not present

## 2024-02-10 DIAGNOSIS — Z7982 Long term (current) use of aspirin: Secondary | ICD-10-CM | POA: Diagnosis not present

## 2024-02-10 DIAGNOSIS — R4182 Altered mental status, unspecified: Secondary | ICD-10-CM

## 2024-02-10 DIAGNOSIS — I2510 Atherosclerotic heart disease of native coronary artery without angina pectoris: Secondary | ICD-10-CM | POA: Diagnosis not present

## 2024-02-10 LAB — POCT URINALYSIS DIPSTICK
Bilirubin, UA: NEGATIVE
Glucose, UA: NEGATIVE
Ketones, UA: NEGATIVE
Leukocytes, UA: NEGATIVE
Nitrite, UA: NEGATIVE
Protein, UA: POSITIVE — AB
Spec Grav, UA: 1.015 (ref 1.010–1.025)
Urobilinogen, UA: 0.2 U/dL
pH, UA: 5.5 (ref 5.0–8.0)

## 2024-02-11 DIAGNOSIS — M545 Low back pain, unspecified: Secondary | ICD-10-CM | POA: Diagnosis not present

## 2024-02-11 NOTE — Addendum Note (Signed)
 Addended by: Grayling Congress on: 02/11/2024 01:55 PM   Modules accepted: Orders

## 2024-02-12 ENCOUNTER — Telehealth: Payer: Self-pay | Admitting: Family

## 2024-02-12 LAB — URINALYSIS, ROUTINE W REFLEX MICROSCOPIC
Bilirubin, UA: NEGATIVE
Glucose, UA: NEGATIVE
Ketones, UA: NEGATIVE
Leukocytes,UA: NEGATIVE
Nitrite, UA: POSITIVE — AB
RBC, UA: NEGATIVE
Specific Gravity, UA: 1.026 (ref 1.005–1.030)
Urobilinogen, Ur: 0.2 mg/dL (ref 0.2–1.0)
pH, UA: >=9 — AB (ref 5.0–7.5)

## 2024-02-12 LAB — MICROSCOPIC EXAMINATION
Casts: NONE SEEN /LPF
WBC, UA: NONE SEEN /HPF (ref 0–5)

## 2024-02-12 NOTE — Telephone Encounter (Signed)
 Patient's sister called to get her UA results from 4/1. Amanda sent this off for a urinalysis and culture and we are awaiting those results before sending any antibiotics.

## 2024-02-13 ENCOUNTER — Ambulatory Visit: Payer: 59 | Admitting: Podiatry

## 2024-02-13 DIAGNOSIS — I5022 Chronic systolic (congestive) heart failure: Secondary | ICD-10-CM | POA: Diagnosis not present

## 2024-02-13 DIAGNOSIS — I110 Hypertensive heart disease with heart failure: Secondary | ICD-10-CM | POA: Diagnosis not present

## 2024-02-13 DIAGNOSIS — E569 Vitamin deficiency, unspecified: Secondary | ICD-10-CM | POA: Diagnosis not present

## 2024-02-13 DIAGNOSIS — L97812 Non-pressure chronic ulcer of other part of right lower leg with fat layer exposed: Secondary | ICD-10-CM | POA: Diagnosis not present

## 2024-02-13 DIAGNOSIS — I4891 Unspecified atrial fibrillation: Secondary | ICD-10-CM | POA: Diagnosis not present

## 2024-02-13 DIAGNOSIS — D649 Anemia, unspecified: Secondary | ICD-10-CM | POA: Diagnosis not present

## 2024-02-13 DIAGNOSIS — L97422 Non-pressure chronic ulcer of left heel and midfoot with fat layer exposed: Secondary | ICD-10-CM | POA: Diagnosis not present

## 2024-02-13 DIAGNOSIS — Z556 Problems related to health literacy: Secondary | ICD-10-CM | POA: Diagnosis not present

## 2024-02-13 DIAGNOSIS — Z7902 Long term (current) use of antithrombotics/antiplatelets: Secondary | ICD-10-CM | POA: Diagnosis not present

## 2024-02-13 DIAGNOSIS — Z791 Long term (current) use of non-steroidal anti-inflammatories (NSAID): Secondary | ICD-10-CM | POA: Diagnosis not present

## 2024-02-13 DIAGNOSIS — L89892 Pressure ulcer of other site, stage 2: Secondary | ICD-10-CM | POA: Diagnosis not present

## 2024-02-13 DIAGNOSIS — Q7271 Split foot, right lower limb: Secondary | ICD-10-CM | POA: Diagnosis not present

## 2024-02-13 DIAGNOSIS — Z9181 History of falling: Secondary | ICD-10-CM | POA: Diagnosis not present

## 2024-02-13 DIAGNOSIS — I872 Venous insufficiency (chronic) (peripheral): Secondary | ICD-10-CM | POA: Diagnosis not present

## 2024-02-13 DIAGNOSIS — I2510 Atherosclerotic heart disease of native coronary artery without angina pectoris: Secondary | ICD-10-CM | POA: Diagnosis not present

## 2024-02-13 DIAGNOSIS — I739 Peripheral vascular disease, unspecified: Secondary | ICD-10-CM | POA: Diagnosis not present

## 2024-02-13 DIAGNOSIS — Z7982 Long term (current) use of aspirin: Secondary | ICD-10-CM | POA: Diagnosis not present

## 2024-02-14 LAB — URINE CULTURE

## 2024-02-16 DIAGNOSIS — I110 Hypertensive heart disease with heart failure: Secondary | ICD-10-CM | POA: Diagnosis not present

## 2024-02-16 DIAGNOSIS — Z9181 History of falling: Secondary | ICD-10-CM | POA: Diagnosis not present

## 2024-02-16 DIAGNOSIS — I872 Venous insufficiency (chronic) (peripheral): Secondary | ICD-10-CM | POA: Diagnosis not present

## 2024-02-16 DIAGNOSIS — L89892 Pressure ulcer of other site, stage 2: Secondary | ICD-10-CM | POA: Diagnosis not present

## 2024-02-16 DIAGNOSIS — I2510 Atherosclerotic heart disease of native coronary artery without angina pectoris: Secondary | ICD-10-CM | POA: Diagnosis not present

## 2024-02-16 DIAGNOSIS — I5022 Chronic systolic (congestive) heart failure: Secondary | ICD-10-CM | POA: Diagnosis not present

## 2024-02-16 DIAGNOSIS — Q7271 Split foot, right lower limb: Secondary | ICD-10-CM | POA: Diagnosis not present

## 2024-02-16 DIAGNOSIS — L97812 Non-pressure chronic ulcer of other part of right lower leg with fat layer exposed: Secondary | ICD-10-CM | POA: Diagnosis not present

## 2024-02-16 DIAGNOSIS — I4891 Unspecified atrial fibrillation: Secondary | ICD-10-CM | POA: Diagnosis not present

## 2024-02-16 DIAGNOSIS — I739 Peripheral vascular disease, unspecified: Secondary | ICD-10-CM | POA: Diagnosis not present

## 2024-02-16 DIAGNOSIS — Z7902 Long term (current) use of antithrombotics/antiplatelets: Secondary | ICD-10-CM | POA: Diagnosis not present

## 2024-02-16 DIAGNOSIS — Z556 Problems related to health literacy: Secondary | ICD-10-CM | POA: Diagnosis not present

## 2024-02-16 DIAGNOSIS — E569 Vitamin deficiency, unspecified: Secondary | ICD-10-CM | POA: Diagnosis not present

## 2024-02-16 DIAGNOSIS — Z7982 Long term (current) use of aspirin: Secondary | ICD-10-CM | POA: Diagnosis not present

## 2024-02-16 DIAGNOSIS — Z791 Long term (current) use of non-steroidal anti-inflammatories (NSAID): Secondary | ICD-10-CM | POA: Diagnosis not present

## 2024-02-16 DIAGNOSIS — L97422 Non-pressure chronic ulcer of left heel and midfoot with fat layer exposed: Secondary | ICD-10-CM | POA: Diagnosis not present

## 2024-02-16 DIAGNOSIS — D649 Anemia, unspecified: Secondary | ICD-10-CM | POA: Diagnosis not present

## 2024-02-17 DIAGNOSIS — L97829 Non-pressure chronic ulcer of other part of left lower leg with unspecified severity: Secondary | ICD-10-CM | POA: Diagnosis not present

## 2024-02-17 DIAGNOSIS — L89622 Pressure ulcer of left heel, stage 2: Secondary | ICD-10-CM | POA: Diagnosis not present

## 2024-02-19 ENCOUNTER — Telehealth: Payer: Self-pay | Admitting: Family

## 2024-02-19 NOTE — Telephone Encounter (Signed)
 [name] [name] called stating that hospice came in yesterday and they will be stepping in to help

## 2024-02-20 MED ORDER — CIPROFLOXACIN HCL 250 MG PO TABS: 250.0000 mg | ORAL_TABLET | Freq: Two times a day (BID) | ORAL | 0 refills | Status: AC

## 2024-02-20 NOTE — Addendum Note (Signed)
 Addended byKatherine Mantle on: 02/20/2024 08:38 AM   Modules accepted: Orders

## 2024-02-23 ENCOUNTER — Ambulatory Visit: Admitting: Physician Assistant

## 2024-03-02 ENCOUNTER — Encounter: Payer: Self-pay | Admitting: Family

## 2024-03-02 ENCOUNTER — Ambulatory Visit: Admitting: Family

## 2024-03-02 VITALS — BP 138/90 | Ht 66.0 in | Wt 108.6 lb

## 2024-03-02 DIAGNOSIS — I482 Chronic atrial fibrillation, unspecified: Secondary | ICD-10-CM | POA: Diagnosis not present

## 2024-03-02 DIAGNOSIS — I5022 Chronic systolic (congestive) heart failure: Secondary | ICD-10-CM | POA: Diagnosis not present

## 2024-03-02 DIAGNOSIS — L03119 Cellulitis of unspecified part of limb: Secondary | ICD-10-CM | POA: Diagnosis not present

## 2024-03-02 DIAGNOSIS — R627 Adult failure to thrive: Secondary | ICD-10-CM

## 2024-03-02 DIAGNOSIS — I480 Paroxysmal atrial fibrillation: Secondary | ICD-10-CM

## 2024-03-02 DIAGNOSIS — R300 Dysuria: Secondary | ICD-10-CM | POA: Diagnosis not present

## 2024-03-02 DIAGNOSIS — I10 Essential (primary) hypertension: Secondary | ICD-10-CM

## 2024-03-02 LAB — POCT URINALYSIS DIPSTICK
Bilirubin, UA: NEGATIVE
Blood, UA: NEGATIVE
Glucose, UA: NEGATIVE
Ketones, UA: NEGATIVE
Leukocytes, UA: NEGATIVE
Nitrite, UA: NEGATIVE
Protein, UA: POSITIVE — AB
Spec Grav, UA: 1.02 (ref 1.010–1.025)
Urobilinogen, UA: 0.2 U/dL
pH, UA: 5.5 (ref 5.0–8.0)

## 2024-03-02 NOTE — Progress Notes (Unsigned)
 Established Patient Office Visit  Subjective:  Patient ID: [name] [name], female    DOB: 1934-12-05  Age: 89 y.o. MRN: [id number]  Chief Complaint  Patient presents with   Follow-up    1 month follow up    Patient is here today for her 1 month follow up.  She has been feeling poorly, but has improved since last appointment.   She does have additional concerns to discuss today. She is accompanied by her sister, who has concerns regarding her ability to be safe at home alone.  She has been declining, forgetting things, will not answer her phone when she is called, etc.  She has refused baths multiple times from the hospice providers, as she was "in the middle of something" and it wasn't a convenient time for her.  She only eats when her family members come over and make her food, and she is wearing dirty clothing as well.   Labs are not due today. She needs refills.   I have reviewed her active problem list, medication list, allergies, notes from last encounter, lab results for her appointment today.      No other concerns at this time.   Past Medical History:  Diagnosis Date   AKI (acute kidney injury) (HCC) 02/20/2023   Atrial fibrillation (HCC)    Basal cell carcinoma 2021   L temple   Chronic back pain    Congestive heart failure (CHF) (HCC)    History of total hip replacement (Bilateral) 01/12/2020   Left hip replacement done in 2019.  Right hip replacement done in 2012.   Hypertension    Hyponatremia 06/11/2020   Melena 02/21/2023   NSTEMI (non-ST elevated myocardial infarction) (HCC) 06/11/2020   Pharmacologic therapy 01/12/2020   Problems influencing health status 01/12/2020   Scoliosis    Upper GI bleeding 02/21/2023    Past Surgical History:  Procedure Laterality Date   ABDOMINAL SURGERY     bilateral hip replacement     CORONARY STENT INTERVENTION N/A 06/12/2020   Procedure: CORONARY STENT INTERVENTION;  Surgeon: Wenona Hamilton, MD;  Location: [facility]  INVASIVE CV LAB;  Service: Cardiovascular;  Laterality: N/A;  LAD   ESOPHAGOGASTRODUODENOSCOPY (EGD) WITH PROPOFOL  N/A 02/24/2023   Procedure: ESOPHAGOGASTRODUODENOSCOPY (EGD) WITH PROPOFOL ;  Surgeon: Marnee Sink, MD;  Location: [facility] ENDOSCOPY;  Service: Endoscopy;  Laterality: N/A;   LEFT HEART CATH AND CORONARY ANGIOGRAPHY N/A 06/12/2020   Procedure: LEFT HEART CATH AND CORONARY ANGIOGRAPHY;  Surgeon: Wenona Hamilton, MD;  Location: [facility] INVASIVE CV LAB;  Service: Cardiovascular;  Laterality: N/A;   LOWER EXTREMITY ANGIOGRAPHY Right 02/25/2023   Procedure: Lower Extremity Angiography;  Surgeon: Jackquelyn Mass, MD;  Location: [facility] INVASIVE CV LAB;  Service: Cardiovascular;  Laterality: Right;   TONSILLECTOMY      Social History   Socioeconomic History   Marital status: Single    Spouse name: Not on file   Number of children: 0   Years of education: Not on file   Highest education level: Not on file  Occupational History   Not on file  Tobacco Use   Smoking status: Former    Current packs/day: 0.00    Average packs/day: 0.5 packs/day for 68.0 years (34.0 ttl pk-yrs)    Types: Cigarettes    Start date: 06/11/1952    Quit date: 06/11/2020    Years since quitting: 3.7   Smokeless tobacco: Never  Substance and Sexual Activity   Alcohol use: Not Currently   Drug use: Not  Currently   Sexual activity: Not on file  Other Topics Concern   Not on file  Social History Narrative   Not on file   Social Drivers of Health   Financial Resource Strain: Not on file  Food Insecurity: No Food Insecurity (10/29/2023)   Hunger Vital Sign    Worried About Running Out of Food in the Last Year: Never true    Ran Out of Food in the Last Year: Never true  Transportation Needs: No Transportation Needs (10/29/2023)   PRAPARE - Administrator, Civil Service (Medical): No    Lack of Transportation (Non-Medical): No  Physical Activity: Insufficiently Active (12/25/2022)   Exercise Vital  Sign    Days of Exercise per Week: 7 days    Minutes of Exercise per Session: 20 min  Stress: Stress Concern Present (12/25/2022)   [employer] of Occupational Health - Occupational Stress Questionnaire    Feeling of Stress : Very much  Social Connections: Socially Isolated (12/25/2022)   Social Connection and Isolation Panel [NHANES]    Frequency of Communication with Friends and Family: Never    Frequency of Social Gatherings with Friends and Family: Never    Attends Religious Services: Never    Database administrator or Organizations: No    Attends Banker Meetings: Never    Marital Status: Never married  Intimate Partner Violence: Not At Risk (10/29/2023)   Humiliation, Afraid, Rape, and Kick questionnaire    Fear of Current or Ex-Partner: No    Emotionally Abused: No    Physically Abused: No    Sexually Abused: No    Family History  Problem Relation Age of Onset   Breast cancer Mother    CVA Father    Heart disease Father    Arthritis/Rheumatoid Sister    Breast cancer Paternal Grandmother     Allergies  Allergen Reactions   Spironolactone Anaphylaxis   Fluogen [Influenza Virus Vaccine] Hives   Statins     Dizziness & weakness   Sulfa Antibiotics Other (See Comments)    Unknown, happened as a child    Review of Systems  Cardiovascular:  Positive for leg swelling and PND.  Skin:        Open wounds on legs  Neurological:  Positive for weakness.  Endo/Heme/Allergies:  Bruises/bleeds easily.  Psychiatric/Behavioral:  Positive for memory loss.   All other systems reviewed and are negative.      Objective:   BP (!) 138/90   Ht 5\' 6"  (1.676 m)   Wt 108 lb 9.6 oz (49.3 kg)   BMI 17.53 kg/m   Vitals:   03/02/24 0914  BP: (!) 138/90  Height: 5\' 6"  (1.676 m)  Weight: 108 lb 9.6 oz (49.3 kg)  BMI (Calculated): 17.54    Physical Exam Vitals and nursing note reviewed.  Constitutional:      Appearance: Normal appearance. She is normal  weight.  HENT:     Head: Normocephalic.     Right Ear: Decreased hearing noted.     Left Ear: Decreased hearing noted.  Eyes:     Extraocular Movements: Extraocular movements intact.     Conjunctiva/sclera: Conjunctivae normal.     Pupils: Pupils are equal, round, and reactive to light.  Cardiovascular:     Rate and Rhythm: Normal rate.  Pulmonary:     Effort: Pulmonary effort is normal.  Musculoskeletal:     Right lower leg: Swelling present. Edema present.     Left  lower leg: Swelling present. Edema present.  Skin:    Findings: Bruising, ecchymosis and wound present.       Neurological:     General: No focal deficit present.     Mental Status: She is alert and oriented to person, place, and time. Mental status is at baseline.     Motor: Weakness present.     Gait: Gait abnormal.  Psychiatric:        Attention and Perception: Attention normal.        Mood and Affect: Mood normal. Affect is flat.        Behavior: Behavior is agitated.        Thought Content: Thought content normal.        Cognition and Memory: Cognition is impaired. Memory is impaired.        Judgment: Judgment is impulsive and inappropriate.     Comments: New stutter      Results for orders placed or performed in visit on 03/02/24  POCT Urinalysis Dipstick (81002)  Result Value Ref Range   Color, UA Orange    Clarity, UA Cloudy    Glucose, UA Negative Negative   Bilirubin, UA Negative    Ketones, UA Negative    Spec Grav, UA 1.020 1.010 - 1.025   Blood, UA Negative    pH, UA 5.5 5.0 - 8.0   Protein, UA Positive (A) Negative   Urobilinogen, UA 0.2 0.2 or 1.0 E.U./dL   Nitrite, UA Negative    Leukocytes, UA Negative Negative   Appearance Cloudy    Odor Yes     Recent Results (from the past 2160 hours)  Rad [employer] Session Summary     Status: None   Collection Time: 12/08/23 10:07 AM  Result Value Ref Range   Course ID [email or web address]    Course Intent Curative    Course Start Date 11/26/2023  1:24 PM     Session Number 6    Course First Treatment Date 12/02/2023 10:03 AM    Course Last Treatment Date 12/08/2023 10:07 AM    Course Elapsed Days 6    Reference Point ID Rt Cheek DP    Reference Point Dosage Given to Date 11 Gy   Reference Point Session Dosage Given 2.2 Gy   Reference Point ID Rt Nose DP    Reference Point Dosage Given to Date 11 Gy   Reference Point Session Dosage Given 2.2 Gy   Plan ID [email or web address]    Plan Fractions Treated to Date 5    Plan Total Fractions Prescribed 20    Plan Prescribed Dose Per Fraction 2.2 Gy   Plan Total Prescribed Dose 44.000000 Gy   Plan Primary Reference Point Rt Cheek DP    Plan ID [email or web address]    Plan Fractions Treated to Date 5    Plan Total Fractions Prescribed 20    Plan Prescribed Dose Per Fraction 2.2 Gy   Plan Total Prescribed Dose 44.000000 Gy   Plan Primary Reference Point Rt Nose DP   Rad [facility] Session Summary     Status: None   Collection Time: 12/09/23 10:05 AM  Result Value Ref Range   Course ID [email or web address]    Course Intent Curative    Course Start Date 11/26/2023  1:24 PM    Session Number 7    Course First Treatment Date 12/02/2023 10:03 AM    Course Last Treatment Date 12/09/2023 10:05 AM    Course Elapsed Days 7    Reference  Point ID Rt Cheek DP    Reference Point Dosage Given to Date 13.2 Gy   Reference Point Session Dosage Given 2.2 Gy   Reference Point ID Rt Nose DP    Reference Point Dosage Given to Date 13.2 Gy   Reference Point Session Dosage Given 2.2 Gy   Plan ID [email or web address]_BO    Plan Fractions Treated to Date 6    Plan Total Fractions Prescribed 20    Plan Prescribed Dose Per Fraction 2.2 Gy   Plan Total Prescribed Dose 44.000000 Gy   Plan Primary Reference Point Rt Cheek DP    Plan ID [email or web address]    Plan Fractions Treated to Date 6    Plan Total Fractions Prescribed 20    Plan Prescribed Dose Per Fraction 2.2 Gy   Plan Total Prescribed Dose 44.000000 Gy   Plan Primary Reference Point Rt Nose DP   Rad  [facility] Session Summary     Status: None   Collection Time: 12/10/23  9:48 AM  Result Value Ref Range   Course ID [email or web address]    Course Intent Curative    Course Start Date 11/26/2023  1:24 PM    Session Number 8    Course First Treatment Date 12/02/2023 10:03 AM    Course Last Treatment Date 12/10/2023  9:47 AM    Course Elapsed Days 8    Reference Point ID Rt Cheek DP    Reference Point Dosage Given to Date 15.4 Gy   Reference Point Session Dosage Given 2.2 Gy   Reference Point ID Rt Nose DP    Reference Point Dosage Given to Date 15.4 Gy   Reference Point Session Dosage Given 2.2 Gy   Plan ID [email or web address]_BO    Plan Fractions Treated to Date 7    Plan Total Fractions Prescribed 20    Plan Prescribed Dose Per Fraction 2.2 Gy   Plan Total Prescribed Dose 44.000000 Gy   Plan Primary Reference Point Rt Cheek DP    Plan ID [email or web address]    Plan Fractions Treated to Date 7    Plan Total Fractions Prescribed 20    Plan Prescribed Dose Per Fraction 2.2 Gy   Plan Total Prescribed Dose 44.000000 Gy   Plan Primary Reference Point Rt Nose DP   Rad [facility] Session Summary     Status: None   Collection Time: 12/11/23  9:50 AM  Result Value Ref Range   Course ID [email or web address]    Course Intent Curative    Course Start Date 11/26/2023  1:24 PM    Session Number 9    Course First Treatment Date 12/02/2023 10:03 AM    Course Last Treatment Date 12/11/2023  9:49 AM    Course Elapsed Days 9    Reference Point ID Rt Cheek DP    Reference Point Dosage Given to Date 17.6 Gy   Reference Point Session Dosage Given 2.2 Gy   Reference Point ID Rt Nose DP    Reference Point Dosage Given to Date 17.6 Gy   Reference Point Session Dosage Given 2.2 Gy   Plan ID [email or web address]_BO    Plan Fractions Treated to Date 8    Plan Total Fractions Prescribed 20    Plan Prescribed Dose Per Fraction 2.2 Gy   Plan Total Prescribed Dose 44.000000 Gy   Plan Primary Reference Point Rt Cheek DP    Plan ID [email or web address]    Plan Fractions  Treated to Date 8    Plan Total Fractions Prescribed  20    Plan Prescribed Dose Per Fraction 2.2 Gy   Plan Total Prescribed Dose 44.000000 Gy   Plan Primary Reference Point Rt Nose DP   Rad [facility] Session Summary     Status: None   Collection Time: 12/12/23  9:48 AM  Result Value Ref Range   Course ID [email or web address]    Course Intent Curative    Course Start Date 11/26/2023  1:24 PM    Session Number 10    Course First Treatment Date 12/02/2023 10:03 AM    Course Last Treatment Date 12/12/2023  9:47 AM    Course Elapsed Days 10    Reference Point ID Rt Cheek DP    Reference Point Dosage Given to Date 19.8 Gy   Reference Point Session Dosage Given 2.2 Gy   Reference Point ID Rt Nose DP    Reference Point Dosage Given to Date 19.8 Gy   Reference Point Session Dosage Given 2.2 Gy   Plan ID [email or web address]_BO    Plan Fractions Treated to Date 9    Plan Total Fractions Prescribed 20    Plan Prescribed Dose Per Fraction 2.2 Gy   Plan Total Prescribed Dose 44.000000 Gy   Plan Primary Reference Point Rt Cheek DP    Plan ID [email or web address]    Plan Fractions Treated to Date 9    Plan Total Fractions Prescribed 20    Plan Prescribed Dose Per Fraction 2.2 Gy   Plan Total Prescribed Dose 44.000000 Gy   Plan Primary Reference Point Rt Nose DP   Rad [facility] Session Summary     Status: None   Collection Time: 12/15/23  9:54 AM  Result Value Ref Range   Course ID [email or web address]    Course Intent Curative    Course Start Date 11/26/2023  1:24 PM    Session Number 11    Course First Treatment Date 12/02/2023 10:03 AM    Course Last Treatment Date 12/15/2023  9:53 AM    Course Elapsed Days 13    Reference Point ID Rt Cheek DP    Reference Point Dosage Given to Date 22 Gy   Reference Point Session Dosage Given 2.2 Gy   Reference Point ID Rt Nose DP    Reference Point Dosage Given to Date 22 Gy   Reference Point Session Dosage Given 2.2 Gy   Plan ID [email or web address]_BO    Plan Fractions Treated to Date 10    Plan Total  Fractions Prescribed 20    Plan Prescribed Dose Per Fraction 2.2 Gy   Plan Total Prescribed Dose 44.000000 Gy   Plan Primary Reference Point Rt Cheek DP    Plan ID [email or web address]    Plan Fractions Treated to Date 10    Plan Total Fractions Prescribed 20    Plan Prescribed Dose Per Fraction 2.2 Gy   Plan Total Prescribed Dose 44.000000 Gy   Plan Primary Reference Point Rt Nose DP   Rad [facility] Session Summary     Status: None   Collection Time: 12/16/23  9:54 AM  Result Value Ref Range   Course ID [email or web address]    Course Intent Curative    Course Start Date 11/26/2023  1:24 PM    Session Number 12    Course First Treatment Date 12/02/2023 10:03 AM    Course Last Treatment Date 12/16/2023  9:53 AM    Course Elapsed Days 14    Reference Point ID Rt Cheek DP    Reference Point Dosage Given  to Date 24.2 Gy   Reference Point Session Dosage Given 2.2 Gy   Reference Point ID Rt Nose DP    Reference Point Dosage Given to Date 24.2 Gy   Reference Point Session Dosage Given 2.2 Gy   Plan ID [email or web address]_BO    Plan Fractions Treated to Date 11    Plan Total Fractions Prescribed 20    Plan Prescribed Dose Per Fraction 2.2 Gy   Plan Total Prescribed Dose 44.000000 Gy   Plan Primary Reference Point Rt Cheek DP    Plan ID [email or web address]    Plan Fractions Treated to Date 11    Plan Total Fractions Prescribed 20    Plan Prescribed Dose Per Fraction 2.2 Gy   Plan Total Prescribed Dose 44.000000 Gy   Plan Primary Reference Point Rt Nose DP   Rad [facility] Session Summary     Status: None   Collection Time: 12/17/23 10:01 AM  Result Value Ref Range   Course ID [email or web address]    Course Intent Curative    Course Start Date 11/26/2023  1:24 PM    Session Number 13    Course First Treatment Date 12/02/2023 10:03 AM    Course Last Treatment Date 12/17/2023 10:00 AM    Course Elapsed Days 15    Reference Point ID Rt Cheek DP    Reference Point Dosage Given to Date 26.4 Gy   Reference Point Session Dosage Given 2.2 Gy    Reference Point ID Rt Nose DP    Reference Point Dosage Given to Date 26.4 Gy   Reference Point Session Dosage Given 2.2 Gy   Plan ID [email or web address]_BO    Plan Fractions Treated to Date 12    Plan Total Fractions Prescribed 20    Plan Prescribed Dose Per Fraction 2.2 Gy   Plan Total Prescribed Dose 44.000000 Gy   Plan Primary Reference Point Rt Cheek DP    Plan ID [email or web address]    Plan Fractions Treated to Date 12    Plan Total Fractions Prescribed 20    Plan Prescribed Dose Per Fraction 2.2 Gy   Plan Total Prescribed Dose 44.000000 Gy   Plan Primary Reference Point Rt Nose DP   Rad [facility] Session Summary     Status: None   Collection Time: 12/18/23  9:53 AM  Result Value Ref Range   Course ID [email or web address]    Course Intent Curative    Course Start Date 11/26/2023  1:24 PM    Session Number 14    Course First Treatment Date 12/02/2023 10:03 AM    Course Last Treatment Date 12/18/2023  9:52 AM    Course Elapsed Days 16    Reference Point ID Rt Cheek DP    Reference Point Dosage Given to Date 28.6 Gy   Reference Point Session Dosage Given 2.2 Gy   Reference Point ID Rt Nose DP    Reference Point Dosage Given to Date 28.6 Gy   Reference Point Session Dosage Given 2.2 Gy   Plan ID [email or web address]_BO    Plan Fractions Treated to Date 13    Plan Total Fractions Prescribed 20    Plan Prescribed Dose Per Fraction 2.2 Gy   Plan Total Prescribed Dose 44.000000 Gy   Plan Primary Reference Point Rt Cheek DP    Plan ID [email or web address]    Plan Fractions Treated to Date 13    Plan Total Fractions Prescribed 20    Plan Prescribed Dose Per Fraction 2.2 Gy   Plan Total  Prescribed Dose 44.000000 Gy   Plan Primary Reference Point Rt Nose DP   Rad [facility] Session Summary     Status: None   Collection Time: 12/19/23  9:57 AM  Result Value Ref Range   Course ID [email or web address]    Course Intent Curative    Course Start Date 11/26/2023  1:24 PM    Session Number 15    Course First Treatment Date 12/02/2023 10:03 AM     Course Last Treatment Date 12/19/2023  9:57 AM    Course Elapsed Days 17    Reference Point ID Rt Cheek DP    Reference Point Dosage Given to Date 30.8 Gy   Reference Point Session Dosage Given 2.2 Gy   Reference Point ID Rt Nose DP    Reference Point Dosage Given to Date 30.8 Gy   Reference Point Session Dosage Given 2.2 Gy   Plan ID [email or web address]_BO    Plan Fractions Treated to Date 14    Plan Total Fractions Prescribed 20    Plan Prescribed Dose Per Fraction 2.2 Gy   Plan Total Prescribed Dose 44.000000 Gy   Plan Primary Reference Point Rt Cheek DP    Plan ID [email or web address]    Plan Fractions Treated to Date 14    Plan Total Fractions Prescribed 20    Plan Prescribed Dose Per Fraction 2.2 Gy   Plan Total Prescribed Dose 44.000000 Gy   Plan Primary Reference Point Rt Nose DP   Rad [facility] Session Summary     Status: None   Collection Time: 12/22/23  9:57 AM  Result Value Ref Range   Course ID [email or web address]    Course Intent Curative    Course Start Date 11/26/2023  1:24 PM    Session Number 16    Course First Treatment Date 12/02/2023 10:03 AM    Course Last Treatment Date 12/22/2023  9:56 AM    Course Elapsed Days 20    Reference Point ID Rt Cheek DP    Reference Point Dosage Given to Date 33 Gy   Reference Point Session Dosage Given 2.2 Gy   Reference Point ID Rt Nose DP    Reference Point Dosage Given to Date 33 Gy   Reference Point Session Dosage Given 2.2 Gy   Plan ID [email or web address]_BO    Plan Fractions Treated to Date 15    Plan Total Fractions Prescribed 20    Plan Prescribed Dose Per Fraction 2.2 Gy   Plan Total Prescribed Dose 44.000000 Gy   Plan Primary Reference Point Rt Cheek DP    Plan ID [email or web address]    Plan Fractions Treated to Date 15    Plan Total Fractions Prescribed 20    Plan Prescribed Dose Per Fraction 2.2 Gy   Plan Total Prescribed Dose 44.000000 Gy   Plan Primary Reference Point Rt Nose DP   Rad [facility] Session Summary     Status: None   Collection Time: 12/23/23   9:47 AM  Result Value Ref Range   Course ID [email or web address]    Course Intent Curative    Course Start Date 11/26/2023  1:24 PM    Session Number 17    Course First Treatment Date 12/02/2023 10:03 AM    Course Last Treatment Date 12/23/2023  9:46 AM    Course Elapsed Days 21    Reference Point ID Rt Cheek DP    Reference Point Dosage Given to Date 35.2 Gy   Reference Point Session Dosage Given 2.2 Gy  Reference Point ID Rt Nose DP    Reference Point Dosage Given to Date 35.2 Gy   Reference Point Session Dosage Given 2.2 Gy   Plan ID [email or web address]_BO    Plan Fractions Treated to Date 16    Plan Total Fractions Prescribed 20    Plan Prescribed Dose Per Fraction 2.2 Gy   Plan Total Prescribed Dose 44.000000 Gy   Plan Primary Reference Point Rt Cheek DP    Plan ID [email or web address]    Plan Fractions Treated to Date 16    Plan Total Fractions Prescribed 20    Plan Prescribed Dose Per Fraction 2.2 Gy   Plan Total Prescribed Dose 44.000000 Gy   Plan Primary Reference Point Rt Nose DP   Rad [facility] Session Summary     Status: None   Collection Time: 12/24/23  9:59 AM  Result Value Ref Range   Course ID [email or web address]    Course Intent Curative    Course Start Date 11/26/2023  1:24 PM    Session Number 18    Course First Treatment Date 12/02/2023 10:03 AM    Course Last Treatment Date 12/24/2023  9:58 AM    Course Elapsed Days 22    Reference Point ID Rt Cheek DP    Reference Point Dosage Given to Date 37.4 Gy   Reference Point Session Dosage Given 2.2 Gy   Reference Point ID Rt Nose DP    Reference Point Dosage Given to Date 37.4 Gy   Reference Point Session Dosage Given 2.2 Gy   Plan ID [email or web address]_BO    Plan Fractions Treated to Date 17    Plan Total Fractions Prescribed 20    Plan Prescribed Dose Per Fraction 2.2 Gy   Plan Total Prescribed Dose 44.000000 Gy   Plan Primary Reference Point Rt Cheek DP    Plan ID [email or web address]    Plan Fractions Treated to Date 17    Plan Total Fractions Prescribed 20     Plan Prescribed Dose Per Fraction 2.2 Gy   Plan Total Prescribed Dose 44.000000 Gy   Plan Primary Reference Point Rt Nose DP   [facility] Session Summary     Status: None   Collection Time: 12/25/23 10:00 AM  Result Value Ref Range   Course ID [email or web address]    Course Intent Curative    Course Start Date 11/26/2023  1:24 PM    Session Number 19    Course First Treatment Date 12/02/2023 10:03 AM    Course Last Treatment Date 12/25/2023  9:59 AM    Course Elapsed Days 23    Reference Point ID Rt Cheek DP    Reference Point Dosage Given to Date 39.6 Gy   Reference Point Session Dosage Given 2.2 Gy   Reference Point ID Rt Nose DP    Reference Point Dosage Given to Date 39.6 Gy   Reference Point Session Dosage Given 2.2 Gy   Plan ID [email or web address]_BO    Plan Fractions Treated to Date 18    Plan Total Fractions Prescribed 20    Plan Prescribed Dose Per Fraction 2.2 Gy   Plan Total Prescribed Dose 44.000000 Gy   Plan Primary Reference Point Rt Cheek DP    Plan ID [email or web address]    Plan Fractions Treated to Date 18    Plan Total Fractions Prescribed 20    Plan Prescribed Dose Per Fraction 2.2 Gy   Plan Total Prescribed Dose 44.000000 Gy   Plan Primary Reference Point Rt Nose DP  Rad [facility] Session Summary     Status: None   Collection Time: 12/26/23  9:49 AM  Result Value Ref Range   Course ID [email or web address]    Course Intent Curative    Course Start Date 11/26/2023  1:24 PM    Session Number 20    Course First Treatment Date 12/02/2023 10:03 AM    Course Last Treatment Date 12/26/2023  9:48 AM    Course Elapsed Days 24    Reference Point ID Rt Cheek DP    Reference Point Dosage Given to Date 41.8 Gy   Reference Point Session Dosage Given 2.2 Gy   Reference Point ID Rt Nose DP    Reference Point Dosage Given to Date 41.8 Gy   Reference Point Session Dosage Given 2.2 Gy   Plan ID [email or web address]_BO    Plan Fractions Treated to Date 19    Plan Total Fractions Prescribed 20    Plan Prescribed Dose Per  Fraction 2.2 Gy   Plan Total Prescribed Dose 44.000000 Gy   Plan Primary Reference Point Rt Cheek DP    Plan ID [email or web address]    Plan Fractions Treated to Date 19    Plan Total Fractions Prescribed 20    Plan Prescribed Dose Per Fraction 2.2 Gy   Plan Total Prescribed Dose 44.000000 Gy   Plan Primary Reference Point Rt Nose DP   [facility] Session Summary     Status: None   Collection Time: 12/29/23  9:46 AM  Result Value Ref Range   Course ID [email or web address]    Course Intent Curative    Course Start Date 11/26/2023  1:24 PM    Session Number 21    Course First Treatment Date 12/02/2023 10:03 AM    Course Last Treatment Date 12/29/2023  9:46 AM    Course Elapsed Days 27    Reference Point ID Rt Cheek DP    Reference Point Dosage Given to Date 44 Gy   Reference Point Session Dosage Given 2.2 Gy   Reference Point ID Rt Nose DP    Reference Point Dosage Given to Date 44 Gy   Reference Point Session Dosage Given 2.2 Gy   Plan ID [email or web address]    Plan Fractions Treated to Date 20    Plan Total Fractions Prescribed 20    Plan Prescribed Dose Per Fraction 2.2 Gy   Plan Total Prescribed Dose 44.000000 Gy   Plan Primary Reference Point Rt Cheek DP    Plan ID [email or web address]    Plan Fractions Treated to Date 20    Plan Total Fractions Prescribed 20    Plan Prescribed Dose Per Fraction 2.2 Gy   Plan Total Prescribed Dose 44.000000 Gy   Plan Primary Reference Point Rt Nose DP   Lactic acid, plasma     Status: None   Collection Time: 01/21/24  1:58 PM  Result Value Ref Range   Lactic Acid, Venous 0.8 0.5 - 1.9 mmol/L    Comment: Performed at [facility], [address] [place], [place] [address]  Comprehensive metabolic panel     Status: Abnormal   Collection Time: 01/21/24  1:58 PM  Result Value Ref Range   Sodium 133 (L) 135 - 145 mmol/L   Potassium 4.8 3.5 - 5.1 mmol/L   Chloride 98 98 - 111 mmol/L   CO2 24 22 - 32 mmol/L   Glucose, Bld 104 (H) 70 - 99 mg/dL    Comment: Glucose  reference range applies only to  samples taken after fasting for at least 8 hours.   BUN 22 8 - 23 mg/dL   Creatinine, Ser 9.56 (H) 0.44 - 1.00 mg/dL   Calcium  8.7 (L) 8.9 - 10.3 mg/dL   Total Protein 6.9 6.5 - 8.1 g/dL   Albumin 3.9 3.5 - 5.0 g/dL   AST 27 15 - 41 U/L   ALT 19 0 - 44 U/L   Alkaline Phosphatase 97 38 - 126 U/L   Total Bilirubin 0.8 0.0 - 1.2 mg/dL   GFR, Estimated 52 (L) >60 mL/min    Comment: (NOTE) Calculated using the CKD-EPI Creatinine Equation (2021)    Anion gap 11 5 - 15    Comment: Performed at [facility], [address] [place], [place] [address]  CBC with Differential     Status: Abnormal   Collection Time: 01/21/24  1:58 PM  Result Value Ref Range   WBC 7.3 4.0 - 10.5 K/uL   RBC 2.89 (L) 3.87 - 5.11 MIL/uL   Hemoglobin 8.7 (L) 12.0 - 15.0 g/dL   HCT 65.7 (L) 84.6 - 96.2 %   MCV 95.2 80.0 - 100.0 fL   MCH 30.1 26.0 - 34.0 pg   MCHC 31.6 30.0 - 36.0 g/dL   RDW 95.2 84.1 - 32.4 %   Platelets 259 150 - 400 K/uL   nRBC 0.0 0.0 - 0.2 %   Neutrophils Relative % 81 %   Neutro Abs 5.9 1.7 - 7.7 K/uL   Lymphocytes Relative 9 %   Lymphs Abs 0.7 0.7 - 4.0 K/uL   Monocytes Relative 10 %   Monocytes Absolute 0.7 0.1 - 1.0 K/uL   Eosinophils Relative 0 %   Eosinophils Absolute 0.0 0.0 - 0.5 K/uL   Basophils Relative 0 %   Basophils Absolute 0.0 0.0 - 0.1 K/uL   Immature Granulocytes 0 %   Abs Immature Granulocytes 0.03 0.00 - 0.07 K/uL    Comment: Performed at [facility], [address] [place], [place] [address]  POC Influenza A&B (Binax test)     Status: None   Collection Time: 01/28/24 10:59 AM  Result Value Ref Range   Influenza A, POC Negative Negative   Influenza B, POC Negative Negative  Basic metabolic panel     Status: Abnormal   Collection Time: 01/29/24 11:14 AM  Result Value Ref Range   Sodium 135 135 - 145 mmol/L   Potassium 4.3 3.5 - 5.1 mmol/L   Chloride 105 98 - 111 mmol/L   CO2 22 22 - 32 mmol/L   Glucose,  Bld 156 (H) 70 - 99 mg/dL    Comment: Glucose reference range applies only to samples taken after fasting for at least 8 hours.   BUN 41 (H) 8 - 23 mg/dL   Creatinine, Ser 7.25 (H) 0.44 - 1.00 mg/dL   Calcium  8.3 (L) 8.9 - 10.3 mg/dL   GFR, Estimated 38 (L) >60 mL/min    Comment: (NOTE) Calculated using the CKD-EPI Creatinine Equation (2021)    Anion gap 8 5 - 15    Comment: Performed at [facility], [address] [place], [place] [address]  CBC     Status: Abnormal   Collection Time: 01/29/24 11:14 AM  Result Value Ref Range   WBC 5.5 4.0 - 10.5 K/uL   RBC 3.37 (L) 3.87 - 5.11 MIL/uL   Hemoglobin 10.1 (L) 12.0 - 15.0 g/dL   HCT 03.4 (L) 74.2 - 59.5 %   MCV 94.7  80.0 - 100.0 fL   MCH 30.0 26.0 - 34.0 pg   MCHC 31.7 30.0 - 36.0 g/dL   RDW 16.1 09.6 - 04.5 %   Platelets 311 150 - 400 K/uL   nRBC 0.0 0.0 - 0.2 %    Comment: Performed at [facility], [address] [place], [place] [address]  Troponin I (High Sensitivity)     Status: None   Collection Time: 01/29/24 11:14 AM  Result Value Ref Range   Troponin I (High Sensitivity) 16 <18 ng/L    Comment: (NOTE) Elevated high sensitivity troponin I (hsTnI) values and significant  changes across serial measurements may suggest ACS but many other  chronic and acute conditions are known to elevate hsTnI results.  Refer to the "Links" section for chest pain algorithms and additional  guidance. Performed at [facility], [address] [place], [place] [address]   Protime-INR     Status: Abnormal   Collection Time: 01/29/24 11:14 AM  Result Value Ref Range   Prothrombin Time 17.4 (H) 11.4 - 15.2 seconds   INR 1.4 (H) 0.8 - 1.2    Comment: (NOTE) INR goal varies based on device and disease states. Performed at [facility], [address] [place], [place] [address]   POCT Urinalysis Dipstick [phone])     Status: Abnormal   Collection Time: 02/10/24  1:32 PM  Result Value Ref Range    Color, UA Yellow    Clarity, UA Clear    Glucose, UA Negative Negative   Bilirubin, UA Negative    Ketones, UA Negative    Spec Grav, UA 1.015 1.010 - 1.025   Blood, UA Trace    pH, UA 5.5 5.0 - 8.0   Protein, UA Positive (A) Negative   Urobilinogen, UA 0.2 0.2 or 1.0 E.U./dL   Nitrite, UA Negative    Leukocytes, UA Negative Negative   Appearance Clear    Odor Yes   Urinalysis, Routine w reflex microscopic     Status: Abnormal   Collection Time: 02/11/24  2:00 PM  Result Value Ref Range   Specific Gravity, UA 1.026 1.005 - 1.030   pH, UA >=9.0 (A) 5.0 - 7.5   Color, UA Yellow Yellow   Appearance Ur Turbid (A) Clear   Leukocytes,UA Negative Negative   Protein,UA 1+ (A) Negative/Trace   Glucose, UA Negative Negative   Ketones, UA Negative Negative   RBC, UA Negative Negative   Bilirubin, UA Negative Negative   Urobilinogen, Ur 0.2 0.2 - 1.0 mg/dL   Nitrite, UA Positive (A) Negative   Microscopic Examination See below:     Comment: Microscopic was indicated and was performed.  Urine Culture     Status: Abnormal   Collection Time: 02/11/24  2:00 PM   Specimen: Urine, Clean Catch   UR  Result Value Ref Range   Urine Culture, Routine Final report (A)    Organism ID, Bacteria Proteus mirabilis (A)     Comment: Cefazolin  with an MIC <=16 predicts susceptibility to the oral agents cefaclor, cefdinir, cefpodoxime, cefprozil, cefuroxime, cephalexin , and loracarbef when used for therapy of uncomplicated urinary tract infections due to E. coli, Klebsiella pneumoniae, and Proteus mirabilis. Multi-Drug Resistant Organism Greater than 100,000 colony forming units per mL    Antimicrobial Susceptibility Comment     Comment:       ** S = Susceptible; I = Intermediate; R = Resistant **  P = Positive; N = Negative             MICS are expressed in micrograms per mL    Antibiotic                 RSLT#1    RSLT#2    RSLT#3    RSLT#4 Ampicillin                      R Cefazolin                       S Cefepime                       S Cefoxitin                      S Cefpodoxime                    S Ceftriaxone                     S Ciprofloxacin                   S Ertapenem                      S Gentamicin                     S Levofloxacin                   I Meropenem                      S Nitrofurantoin                 R Piperacillin/Tazobactam        S Tetracycline                   R Tobramycin                     S Trimethoprim/Sulfa             S   Microscopic Examination     Status: Abnormal   Collection Time: 02/11/24  2:00 PM  Result Value Ref Range   WBC, UA None seen 0 - 5 /hpf   RBC, Urine 0-2 0 - 2 /hpf   Epithelial Cells (non renal) 0-10 0 - 10 /hpf   Casts None seen None seen /lpf   Bacteria, UA Many (A) None seen/Few  POCT Urinalysis Dipstick ([address])     Status: Abnormal   Collection Time: 03/02/24  9:45 AM  Result Value Ref Range   Color, UA Orange    Clarity, UA Cloudy    Glucose, UA Negative Negative   Bilirubin, UA Negative    Ketones, UA Negative    Spec Grav, UA 1.020 1.010 - 1.025   Blood, UA Negative    pH, UA 5.5 5.0 - 8.0   Protein, UA Positive (A) Negative   Urobilinogen, UA 0.2 0.2 or 1.0 E.U./dL   Nitrite, UA Negative    Leukocytes, UA Negative Negative   Appearance Cloudy    Odor Yes        Assessment & Plan:   Problem List Items Addressed This Visit       Cardiovascular and Mediastinum   Atrial fibrillation, chronic (HCC)  Patient is seen by Cardiology, who manage this condition.  She is well controlled with current therapy.   Will defer to them for further changes to plan of care.       Chronic systolic (congestive) heart failure [facility])   Patient is seen by Cardiology, who manage this condition.  She is well controlled with current therapy.   Will defer to them for further changes to plan of care.         Other   Cellulitis   Failure to thrive in adult   Condition is  improved, though there is a significant risk and she is still underweight.  Her family are not able to continue providing the level of care that she is getting currently.   We have discussed the possibility of an assisted living situation, and she is in agreement that this might be good for her. Will contact the hospice workers to hopefully get social work involved.  If not able, will consider other methods of getting them involved for patient so we are able to get her taken care of.      Other Visit Diagnoses       Dysuria    -  Primary   rechecking UA to make sure UTI has cleared   Relevant Orders   POCT Urinalysis Dipstick (29528) (Completed)       Return in about 1 month (around 04/01/2024).   Total time spent: 30 minutes  Trenda Frisk, FNP  03/02/2024   This document may have been prepared by [facility] Voice Recognition software and as such may include unintentional dictation errors.

## 2024-03-03 ENCOUNTER — Encounter (INDEPENDENT_AMBULATORY_CARE_PROVIDER_SITE_OTHER)

## 2024-03-03 ENCOUNTER — Ambulatory Visit (INDEPENDENT_AMBULATORY_CARE_PROVIDER_SITE_OTHER): Admitting: Nurse Practitioner

## 2024-03-05 ENCOUNTER — Encounter: Payer: Self-pay | Admitting: Family

## 2024-03-05 ENCOUNTER — Other Ambulatory Visit: Payer: Self-pay | Admitting: Hospice and Palliative Medicine

## 2024-03-05 DIAGNOSIS — R627 Adult failure to thrive: Secondary | ICD-10-CM | POA: Insufficient documentation

## 2024-03-05 DIAGNOSIS — C44310 Basal cell carcinoma of skin of unspecified parts of face: Secondary | ICD-10-CM

## 2024-03-05 NOTE — Assessment & Plan Note (Signed)
 Patient is seen by Cardiology, who manage this condition.  She is well controlled with current therapy.   Will defer to them for further changes to plan of care.

## 2024-03-05 NOTE — Assessment & Plan Note (Signed)
 Condition is improved, though there is a significant risk and she is still underweight.  Her family are not able to continue providing the level of care that she is getting currently.   We have discussed the possibility of an assisted living situation, and she is in agreement that this might be good for her. Will contact the hospice workers to hopefully get social work involved.  If not able, will consider other methods of getting them involved for patient so we are able to get her taken care of.

## 2024-03-06 NOTE — Assessment & Plan Note (Signed)
 Patient has been seeing Vascular regarding her leg wounds She will not go to the wound center again.   She does have HH nurse coming out twice weekly to change dressings.   Legs are looking better today.

## 2024-03-06 NOTE — Assessment & Plan Note (Signed)
 Will continue supplements as needed.

## 2024-03-06 NOTE — Assessment & Plan Note (Signed)
 Patient has been trying to make an effort to increase her nutritional intake.  Suggest supplementary shakes or something similar.

## 2024-03-06 NOTE — Assessment & Plan Note (Signed)
 Patient is seen by Cardiology, who manage this condition.  She is well controlled with current therapy.   Will defer to them for further changes to plan of care.

## 2024-03-06 NOTE — Assessment & Plan Note (Signed)
Patient is seen by Vascular, who manage this condition.  She is well controlled with current therapy.   Will defer to them for further changes to plan of care.

## 2024-03-06 NOTE — Assessment & Plan Note (Signed)
 Patient stable.  Well controlled with current therapy.   Continue current meds.

## 2024-03-11 ENCOUNTER — Other Ambulatory Visit: Payer: Self-pay

## 2024-03-11 ENCOUNTER — Ambulatory Visit (INDEPENDENT_AMBULATORY_CARE_PROVIDER_SITE_OTHER)

## 2024-03-11 ENCOUNTER — Ambulatory Visit (INDEPENDENT_AMBULATORY_CARE_PROVIDER_SITE_OTHER): Admitting: Nurse Practitioner

## 2024-03-11 ENCOUNTER — Encounter (INDEPENDENT_AMBULATORY_CARE_PROVIDER_SITE_OTHER): Payer: Self-pay | Admitting: Nurse Practitioner

## 2024-03-11 VITALS — BP 149/73 | HR 93 | Resp 16 | Wt 110.6 lb

## 2024-03-11 DIAGNOSIS — I739 Peripheral vascular disease, unspecified: Secondary | ICD-10-CM

## 2024-03-11 DIAGNOSIS — I872 Venous insufficiency (chronic) (peripheral): Secondary | ICD-10-CM | POA: Diagnosis not present

## 2024-03-11 DIAGNOSIS — I10 Essential (primary) hypertension: Secondary | ICD-10-CM

## 2024-03-11 DIAGNOSIS — Z9889 Other specified postprocedural states: Secondary | ICD-10-CM | POA: Diagnosis not present

## 2024-03-11 LAB — VAS US ABI WITH/WO TBI
Left ABI: 1.15
Right ABI: 1.01

## 2024-03-11 MED ORDER — OXYCODONE-ACETAMINOPHEN 10-325 MG PO TABS: 1.0000 | ORAL_TABLET | Freq: Four times a day (QID) | ORAL | 0 refills | Status: DC | PRN

## 2024-03-14 ENCOUNTER — Encounter (INDEPENDENT_AMBULATORY_CARE_PROVIDER_SITE_OTHER): Payer: Self-pay | Admitting: Nurse Practitioner

## 2024-03-14 NOTE — Progress Notes (Signed)
 Subjective:    Patient ID: [name] [name], female    DOB: 03/05/1935, 89 y.o.   MRN: [id number] Chief Complaint  Patient presents with   Follow-up    3-4 week abi follow up    The patient returns today for follow-up evaluation of her peripheral vascular disease in addition to her lower extremity edema.  Today the patient has an ABI of 1.01 on the right and 1.15 on the left with biphasic tibial waveforms and slightly dampened toe waveforms bilaterally.  The TBI's are normal however.  Today the wound on the foot appears to be healed unfortunately there is significant debris in the foot as well so that makes it difficult to see.  But she has significant swelling in multiple small wounds bilaterally.  She also appears to be weeping fairly significantly.    Review of Systems  Cardiovascular:  Positive for leg swelling.  Skin:  Positive for wound.  All other systems reviewed and are negative.      Objective:   Physical Exam Vitals reviewed.  HENT:     Head: Normocephalic.  Cardiovascular:     Rate and Rhythm: Normal rate.  Pulmonary:     Effort: Pulmonary effort is normal.  Musculoskeletal:     Right lower leg: 2+ Pitting Edema present.     Left lower leg: 2+ Pitting Edema present.  Skin:    General: Skin is warm and dry.  Neurological:     Mental Status: She is alert and oriented to person, place, and time.     Motor: Weakness present.     Gait: Gait abnormal.  Psychiatric:        Mood and Affect: Mood normal.        Behavior: Behavior normal.        Thought Content: Thought content normal.        Judgment: Judgment normal.     BP (!) 149/73   Pulse 93   Resp 16   Wt 110 lb 9.6 oz (50.2 kg)   BMI 17.85 kg/m   Past Medical History:  Diagnosis Date   AKI (acute kidney injury) (HCC) 02/20/2023   Atrial fibrillation (HCC)    Basal cell carcinoma 2021   L temple   Chronic back pain    Congestive heart failure (CHF) (HCC)    History of total hip replacement  (Bilateral) 01/12/2020   Left hip replacement done in 2019.  Right hip replacement done in 2012.   Hypertension    Hyponatremia 06/11/2020   Melena 02/21/2023   NSTEMI (non-ST elevated myocardial infarction) (HCC) 06/11/2020   Pharmacologic therapy 01/12/2020   Problems influencing health status 01/12/2020   Scoliosis    Upper GI bleeding 02/21/2023    Social History   Socioeconomic History   Marital status: Single    Spouse name: Not on file   Number of children: 0   Years of education: Not on file   Highest education level: Not on file  Occupational History   Not on file  Tobacco Use   Smoking status: Former    Current packs/day: 0.00    Average packs/day: 0.5 packs/day for 68.0 years (34.0 ttl pk-yrs)    Types: Cigarettes    Start date: 06/11/1952    Quit date: 06/11/2020    Years since quitting: 3.7   Smokeless tobacco: Never  Substance and Sexual Activity   Alcohol use: Not Currently   Drug use: Not Currently   Sexual activity: Not on file  Other  Topics Concern   Not on file  Social History Narrative   Not on file   Social Drivers of Health   Financial Resource Strain: Not on file  Food Insecurity: No Food Insecurity (10/29/2023)   Hunger Vital Sign    Worried About Running Out of Food in the Last Year: Never true    Ran Out of Food in the Last Year: Never true  Transportation Needs: No Transportation Needs (10/29/2023)   PRAPARE - Administrator, Civil Service (Medical): No    Lack of Transportation (Non-Medical): No  Physical Activity: Insufficiently Active (12/25/2022)   Exercise Vital Sign    Days of Exercise per Week: 7 days    Minutes of Exercise per Session: 20 min  Stress: Stress Concern Present (12/25/2022)   [employer] of Occupational Health - Occupational Stress Questionnaire    Feeling of Stress : Very much  Social Connections: Socially Isolated (12/25/2022)   Social Connection and Isolation Panel [NHANES]    Frequency of  Communication with Friends and Family: Never    Frequency of Social Gatherings with Friends and Family: Never    Attends Religious Services: Never    Database administrator or Organizations: No    Attends Banker Meetings: Never    Marital Status: Never married  Intimate Partner Violence: Not At Risk (10/29/2023)   Humiliation, Afraid, Rape, and Kick questionnaire    Fear of Current or Ex-Partner: No    Emotionally Abused: No    Physically Abused: No    Sexually Abused: No    Past Surgical History:  Procedure Laterality Date   ABDOMINAL SURGERY     bilateral hip replacement     CORONARY STENT INTERVENTION N/A 06/12/2020   Procedure: CORONARY STENT INTERVENTION;  Surgeon: Wenona Hamilton, MD;  Location: [facility] INVASIVE CV LAB;  Service: Cardiovascular;  Laterality: N/A;  LAD   ESOPHAGOGASTRODUODENOSCOPY (EGD) WITH PROPOFOL  N/A 02/24/2023   Procedure: ESOPHAGOGASTRODUODENOSCOPY (EGD) WITH PROPOFOL ;  Surgeon: Marnee Sink, MD;  Location: [facility] ENDOSCOPY;  Service: Endoscopy;  Laterality: N/A;   LEFT HEART CATH AND CORONARY ANGIOGRAPHY N/A 06/12/2020   Procedure: LEFT HEART CATH AND CORONARY ANGIOGRAPHY;  Surgeon: Wenona Hamilton, MD;  Location: [facility] INVASIVE CV LAB;  Service: Cardiovascular;  Laterality: N/A;   LOWER EXTREMITY ANGIOGRAPHY Right 02/25/2023   Procedure: Lower Extremity Angiography;  Surgeon: Jackquelyn Mass, MD;  Location: [facility] INVASIVE CV LAB;  Service: Cardiovascular;  Laterality: Right;   TONSILLECTOMY      Family History  Problem Relation Age of Onset   Breast cancer Mother    CVA Father    Heart disease Father    Arthritis/Rheumatoid Sister    Breast cancer Paternal Grandmother     Allergies  Allergen Reactions   Spironolactone Anaphylaxis   Fluogen [Influenza Virus Vaccine] Hives   Statins     Dizziness & weakness   Sulfa Antibiotics Other (See Comments)    Unknown, happened as a child       Latest Ref Rng & Units 01/29/2024   11:14 AM  01/21/2024    1:58 PM 10/01/2023   11:19 AM  CBC  WBC 4.0 - 10.5 K/uL 5.5  7.3  7.5   Hemoglobin 12.0 - 15.0 g/dL 16.1  8.7  09.6   Hematocrit 36.0 - 46.0 % 31.9  27.5  33.8   Platelets 150 - 400 K/uL 311  259  313       CMP     Component  Value Date/Time   NA 135 01/29/2024 1114   NA 136 10/01/2023 1119   K 4.3 01/29/2024 1114   CL 105 01/29/2024 1114   CO2 22 01/29/2024 1114   GLUCOSE 156 (H) 01/29/2024 1114   BUN 41 (H) 01/29/2024 1114   BUN 15 10/01/2023 1119   CREATININE 1.34 (H) 01/29/2024 1114   CALCIUM  8.3 (L) 01/29/2024 1114   PROT 6.9 01/21/2024 1358   PROT 6.9 10/01/2023 1119   ALBUMIN 3.9 01/21/2024 1358   ALBUMIN 4.3 10/01/2023 1119   AST 27 01/21/2024 1358   ALT 19 01/21/2024 1358   ALKPHOS 97 01/21/2024 1358   BILITOT 0.8 01/21/2024 1358   BILITOT 0.3 10/01/2023 1119   EGFR 54 (L) 10/01/2023 1119   GFRNONAA 38 (L) 01/29/2024 1114     VAS US  ABI WITH/WO TBI Result Date: 03/11/2024  LOWER EXTREMITY DOPPLER STUDY Patient Name:  [name] [name]  Date of Exam:   03/11/2024 Medical Rec #: [id number]      Accession #:    [id number] Date of Birth: 1935/04/03      Patient Gender: F Patient Age:   68 years Exam Location:  [facility] Procedure:      VAS US  ABI WITH/WO TBI Referring Phys: --------------------------------------------------------------------------------  Indications: Ulceration.  Vascular Interventions: 02/25/23 Percutaneous transluminal angioplasty and stent                         placement right superficial femoral artery                         4. Percutaneous transluminal angioplasty and stent                         placement right external iliac artery. Comparison Study: 04/16/2023 Performing Technologist: Faustine Hoof RVT  Examination Guidelines: A complete evaluation includes at minimum, Doppler waveform signals and systolic blood pressure reading at the level of bilateral brachial, anterior tibial, and posterior tibial arteries, when vessel  segments are accessible. Bilateral testing is considered an integral part of a complete examination. Photoelectric Plethysmograph (PPG) waveforms and toe systolic pressure readings are included as required and additional duplex testing as needed. Limited examinations for reoccurring indications may be performed as noted.  ABI Findings: +---------+------------------+-----+--------+--------+ Right    Rt Pressure (mmHg)IndexWaveformComment  +---------+------------------+-----+--------+--------+ Brachial 126                                     +---------+------------------+-----+--------+--------+ PTA      131               1.01 biphasic         +---------+------------------+-----+--------+--------+ DP       124               0.95 biphasic         +---------+------------------+-----+--------+--------+ Great Toe109               0.84 Abnormal         +---------+------------------+-----+--------+--------+ +---------+------------------+-----+--------+-------+ Left     Lt Pressure (mmHg)IndexWaveformComment +---------+------------------+-----+--------+-------+ Brachial 130                                    +---------+------------------+-----+--------+-------+ PTA      150  1.15 biphasic        +---------+------------------+-----+--------+-------+ DP       135               1.04 biphasic        +---------+------------------+-----+--------+-------+ Great Toe110               0.85 Abnormal        +---------+------------------+-----+--------+-------+ +-------+-----------+-----------+------------+------------+ ABI/TBIToday's ABIToday's TBIPrevious ABIPrevious TBI +-------+-----------+-----------+------------+------------+ Right  1.01       .84        1.16        1.04         +-------+-----------+-----------+------------+------------+ Left   1.15       .85        1.07        .88           +-------+-----------+-----------+------------+------------+ Bilateral ABIs appear essentially unchanged compared to prior study on 04/2023.  Summary: Right: Resting right ankle-brachial index is within normal range. The right toe-brachial index is normal. Left: Resting left ankle-brachial index is within normal range. The left toe-brachial index is normal. *See table(s) above for measurements and observations.  Electronically signed by Mikki Alexander MD on 03/11/2024 at 3:53:50 PM.    Final        Assessment & Plan:   1. Peripheral arterial disease with history of revascularization (HCC) (Primary) Today noninvasive study show patient should have adequate perfusion for wound healing.  Patient's family notes that she will likely be going into assisted living soon and they should be able to help with overseeing her wound care.  2. Primary hypertension Continue antihypertensive medications as already ordered, these medications have been reviewed and there are no changes at this time.  3. Venous insufficiency (chronic) (peripheral) No surgery or intervention at this point in time.    I have had a long discussion with the patient regarding venous insufficiency and why it  causes symptoms, specifically venous ulceration. I have discussed with the patient the chronic skin changes that accompany venous insufficiency and the long term sequela such as infection and recurring  ulceration.  Patient will be placed in [employer] which will be changed weekly drainage permitting.  In addition, behavioral modification including several periods of elevation of the lower extremities during the day will be continued. Achieving a position with the ankles at heart level was stressed to the patient    Current Outpatient Medications on File Prior to Visit  Medication Sig Dispense Refill   aspirin  EC 81 MG tablet Take 1 tablet (81 mg total) by mouth daily. Swallow whole. 30 tablet 0   erythromycin ophthalmic ointment Place  3.5 g into both eyes as needed.     feeding supplement (ENSURE ENLIVE / ENSURE PLUS) LIQD Take 237 mLs by mouth 2 (two) times daily between meals. 237 mL 12   Ferrous Sulfate (IRON ) 325 (65 Fe) MG TABS TAKE 1 TABLET BY MOUTH ONCE DAILY 30 tablet 8   gabapentin  (NEURONTIN ) 400 MG capsule TAKE 1 CAPSULE BY MOUTH FOUR TIMES DAILY 120 capsule 8   lisinopril  (ZESTRIL ) 5 MG tablet Take 5 mg by mouth daily.     meloxicam (MOBIC) 15 MG tablet TAKE 1 TABLET BY MOUTH ONCE DAILY 30 tablet 8   metoprolol  succinate (TOPROL -XL) 25 MG 24 hr tablet TAKE 1 TABLET BY MOUTH ONCE DAILY 30 tablet 8   ondansetron  (ZOFRAN -ODT) 4 MG disintegrating tablet Take 1 tablet (4 mg total) by mouth every 8 (eight) hours as needed  for nausea or vomiting. 20 tablet 0   terbinafine  (LAMISIL ) 250 MG tablet Take 250 mg by mouth daily.     valsartan  (DIOVAN ) 40 MG tablet Take 1 tablet (40 mg total) by mouth 2 (two) times daily. 60 tablet 1   No current facility-administered medications on file prior to visit.    There are no Patient Instructions on file for this visit. No follow-ups on file.   Britlyn Martine E Jaelah Hauth, NP

## 2024-03-16 ENCOUNTER — Inpatient Hospital Stay (HOSPITAL_COMMUNITY)
Admit: 2024-03-16 | Discharge: 2024-03-16 | Disposition: A | Discharge status: Home / Self Care | Attending: Internal Medicine | Admitting: Internal Medicine

## 2024-03-16 ENCOUNTER — Inpatient Hospital Stay
Admission: EM | Admit: 2024-03-16 | Discharge: 2024-04-02 | DRG: 291 | Disposition: A | Discharge status: Skilled Nursing Facility | Attending: Internal Medicine | Admitting: Internal Medicine

## 2024-03-16 ENCOUNTER — Other Ambulatory Visit: Payer: Self-pay

## 2024-03-16 ENCOUNTER — Emergency Department

## 2024-03-16 DIAGNOSIS — Z955 Presence of coronary angioplasty implant and graft: Secondary | ICD-10-CM | POA: Diagnosis not present

## 2024-03-16 DIAGNOSIS — Z923 Personal history of irradiation: Secondary | ICD-10-CM

## 2024-03-16 DIAGNOSIS — R410 Disorientation, unspecified: Secondary | ICD-10-CM | POA: Diagnosis not present

## 2024-03-16 DIAGNOSIS — J9811 Atelectasis: Secondary | ICD-10-CM | POA: Diagnosis present

## 2024-03-16 DIAGNOSIS — Z87891 Personal history of nicotine dependence: Secondary | ICD-10-CM | POA: Diagnosis not present

## 2024-03-16 DIAGNOSIS — R339 Retention of urine, unspecified: Secondary | ICD-10-CM | POA: Diagnosis not present

## 2024-03-16 DIAGNOSIS — D649 Anemia, unspecified: Secondary | ICD-10-CM | POA: Diagnosis not present

## 2024-03-16 DIAGNOSIS — I482 Chronic atrial fibrillation, unspecified: Secondary | ICD-10-CM

## 2024-03-16 DIAGNOSIS — D509 Iron deficiency anemia, unspecified: Secondary | ICD-10-CM | POA: Diagnosis present

## 2024-03-16 DIAGNOSIS — I872 Venous insufficiency (chronic) (peripheral): Secondary | ICD-10-CM | POA: Diagnosis present

## 2024-03-16 DIAGNOSIS — I472 Ventricular tachycardia, unspecified: Secondary | ICD-10-CM | POA: Diagnosis not present

## 2024-03-16 DIAGNOSIS — I5021 Acute systolic (congestive) heart failure: Secondary | ICD-10-CM

## 2024-03-16 DIAGNOSIS — R0609 Other forms of dyspnea: Secondary | ICD-10-CM | POA: Diagnosis present

## 2024-03-16 DIAGNOSIS — N179 Acute kidney failure, unspecified: Secondary | ICD-10-CM

## 2024-03-16 DIAGNOSIS — Z515 Encounter for palliative care: Secondary | ICD-10-CM

## 2024-03-16 DIAGNOSIS — R0989 Other specified symptoms and signs involving the circulatory and respiratory systems: Secondary | ICD-10-CM | POA: Diagnosis not present

## 2024-03-16 DIAGNOSIS — F0390 Unspecified dementia without behavioral disturbance: Secondary | ICD-10-CM | POA: Diagnosis present

## 2024-03-16 DIAGNOSIS — I5033 Acute on chronic diastolic (congestive) heart failure: Secondary | ICD-10-CM | POA: Diagnosis present

## 2024-03-16 DIAGNOSIS — Z96643 Presence of artificial hip joint, bilateral: Secondary | ICD-10-CM | POA: Diagnosis present

## 2024-03-16 DIAGNOSIS — Z85828 Personal history of other malignant neoplasm of skin: Secondary | ICD-10-CM | POA: Diagnosis not present

## 2024-03-16 DIAGNOSIS — I5023 Acute on chronic systolic (congestive) heart failure: Secondary | ICD-10-CM | POA: Diagnosis not present

## 2024-03-16 DIAGNOSIS — Z888 Allergy status to other drugs, medicaments and biological substances status: Secondary | ICD-10-CM

## 2024-03-16 DIAGNOSIS — I517 Cardiomegaly: Secondary | ICD-10-CM | POA: Diagnosis not present

## 2024-03-16 DIAGNOSIS — Z751 Person awaiting admission to adequate facility elsewhere: Secondary | ICD-10-CM

## 2024-03-16 DIAGNOSIS — Z681 Body mass index (BMI) 19 or less, adult: Secondary | ICD-10-CM | POA: Diagnosis not present

## 2024-03-16 DIAGNOSIS — D692 Other nonthrombocytopenic purpura: Secondary | ICD-10-CM | POA: Diagnosis present

## 2024-03-16 DIAGNOSIS — I509 Heart failure, unspecified: Secondary | ICD-10-CM | POA: Diagnosis not present

## 2024-03-16 DIAGNOSIS — Z823 Family history of stroke: Secondary | ICD-10-CM

## 2024-03-16 DIAGNOSIS — J90 Pleural effusion, not elsewhere classified: Secondary | ICD-10-CM | POA: Diagnosis not present

## 2024-03-16 DIAGNOSIS — E43 Unspecified severe protein-calorie malnutrition: Secondary | ICD-10-CM | POA: Diagnosis present

## 2024-03-16 DIAGNOSIS — Z8249 Family history of ischemic heart disease and other diseases of the circulatory system: Secondary | ICD-10-CM

## 2024-03-16 DIAGNOSIS — Z882 Allergy status to sulfonamides status: Secondary | ICD-10-CM

## 2024-03-16 DIAGNOSIS — R000 Tachycardia, unspecified: Secondary | ICD-10-CM | POA: Diagnosis not present

## 2024-03-16 DIAGNOSIS — Z66 Do not resuscitate: Secondary | ICD-10-CM | POA: Diagnosis not present

## 2024-03-16 DIAGNOSIS — I110 Hypertensive heart disease with heart failure: Secondary | ICD-10-CM | POA: Diagnosis present

## 2024-03-16 DIAGNOSIS — Z9181 History of falling: Secondary | ICD-10-CM

## 2024-03-16 DIAGNOSIS — Z7982 Long term (current) use of aspirin: Secondary | ICD-10-CM

## 2024-03-16 DIAGNOSIS — Z79899 Other long term (current) drug therapy: Secondary | ICD-10-CM

## 2024-03-16 DIAGNOSIS — I081 Rheumatic disorders of both mitral and tricuspid valves: Secondary | ICD-10-CM | POA: Diagnosis present

## 2024-03-16 DIAGNOSIS — R609 Edema, unspecified: Secondary | ICD-10-CM | POA: Diagnosis not present

## 2024-03-16 DIAGNOSIS — R404 Transient alteration of awareness: Secondary | ICD-10-CM | POA: Diagnosis not present

## 2024-03-16 DIAGNOSIS — I25118 Atherosclerotic heart disease of native coronary artery with other forms of angina pectoris: Secondary | ICD-10-CM

## 2024-03-16 DIAGNOSIS — Z743 Need for continuous supervision: Secondary | ICD-10-CM | POA: Diagnosis not present

## 2024-03-16 DIAGNOSIS — I252 Old myocardial infarction: Secondary | ICD-10-CM | POA: Diagnosis not present

## 2024-03-16 DIAGNOSIS — R4189 Other symptoms and signs involving cognitive functions and awareness: Secondary | ICD-10-CM

## 2024-03-16 DIAGNOSIS — R0902 Hypoxemia: Secondary | ICD-10-CM | POA: Diagnosis not present

## 2024-03-16 DIAGNOSIS — Z791 Long term (current) use of non-steroidal anti-inflammatories (NSAID): Secondary | ICD-10-CM

## 2024-03-16 DIAGNOSIS — I10 Essential (primary) hypertension: Secondary | ICD-10-CM | POA: Diagnosis not present

## 2024-03-16 DIAGNOSIS — R0602 Shortness of breath: Secondary | ICD-10-CM | POA: Diagnosis not present

## 2024-03-16 DIAGNOSIS — Z803 Family history of malignant neoplasm of breast: Secondary | ICD-10-CM

## 2024-03-16 LAB — IRON AND TIBC
Iron: 19 ug/dL — ABNORMAL LOW (ref 28–170)
Saturation Ratios: 5 % — ABNORMAL LOW (ref 10.4–31.8)
TIBC: 424 ug/dL (ref 250–450)
UIBC: 405 ug/dL

## 2024-03-16 LAB — COMPREHENSIVE METABOLIC PANEL WITH GFR
ALT: 26 U/L (ref 0–44)
AST: 24 U/L (ref 15–41)
Albumin: 3.2 g/dL — ABNORMAL LOW (ref 3.5–5.0)
Alkaline Phosphatase: 111 U/L (ref 38–126)
Anion gap: 9 (ref 5–15)
BUN: 25 mg/dL — ABNORMAL HIGH (ref 8–23)
CO2: 21 mmol/L — ABNORMAL LOW (ref 22–32)
Calcium: 8.1 mg/dL — ABNORMAL LOW (ref 8.9–10.3)
Chloride: 106 mmol/L (ref 98–111)
Creatinine, Ser: 1.44 mg/dL — ABNORMAL HIGH (ref 0.44–1.00)
GFR, Estimated: 35 mL/min — ABNORMAL LOW (ref >60)
Glucose, Bld: 107 mg/dL — ABNORMAL HIGH (ref 70–99)
Potassium: 4.5 mmol/L (ref 3.5–5.1)
Sodium: 136 mmol/L (ref 135–145)
Total Bilirubin: 0.8 mg/dL (ref 0.0–1.2)
Total Protein: 6.2 g/dL — ABNORMAL LOW (ref 6.5–8.1)

## 2024-03-16 LAB — CBC
HCT: 26.9 % — ABNORMAL LOW (ref 36.0–46.0)
Hemoglobin: 7.8 g/dL — ABNORMAL LOW (ref 12.0–15.0)
MCH: 26.3 pg (ref 26.0–34.0)
MCHC: 29 g/dL — ABNORMAL LOW (ref 30.0–36.0)
MCV: 90.6 fL (ref 80.0–100.0)
Platelets: 286 10*3/uL (ref 150–400)
RBC: 2.97 MIL/uL — ABNORMAL LOW (ref 3.87–5.11)
RDW: 15.6 % — ABNORMAL HIGH (ref 11.5–15.5)
WBC: 6.6 10*3/uL (ref 4.0–10.5)
nRBC: 0 % (ref 0.0–0.2)

## 2024-03-16 LAB — BRAIN NATRIURETIC PEPTIDE: B Natriuretic Peptide: 919.8 pg/mL — ABNORMAL HIGH (ref 0.0–100.0)

## 2024-03-16 LAB — FERRITIN: Ferritin: 22 ng/mL (ref 11–307)

## 2024-03-16 LAB — TROPONIN I (HIGH SENSITIVITY)
Troponin I (High Sensitivity): 24 ng/L — ABNORMAL HIGH (ref <18)
Troponin I (High Sensitivity): 22 ng/L — ABNORMAL HIGH (ref <18)

## 2024-03-16 LAB — TSH: TSH: 0.441 u[IU]/mL (ref 0.350–4.500)

## 2024-03-16 MED ORDER — POLYETHYLENE GLYCOL 3350 17 G PO PACK
17.0000 g | PACK | Freq: Every day | ORAL | Status: DC | PRN
  Administered 2024-03-27: 17 g via ORAL
  Filled 2024-03-16 (×2): qty 1

## 2024-03-16 MED ORDER — FERROUS SULFATE 325 (65 FE) MG PO TABS
325.0000 mg | ORAL_TABLET | Freq: Every day | ORAL | Status: DC
  Administered 2024-03-17 – 2024-04-02 (×17): 325 mg via ORAL
  Filled 2024-03-16 (×17): qty 1

## 2024-03-16 MED ORDER — SODIUM CHLORIDE 0.9% FLUSH
3.0000 mL | Freq: Two times a day (BID) | INTRAVENOUS | Status: DC
  Administered 2024-03-16 – 2024-04-02 (×32): 3 mL via INTRAVENOUS

## 2024-03-16 MED ORDER — OXYCODONE HCL 5 MG PO TABS
5.0000 mg | ORAL_TABLET | Freq: Three times a day (TID) | ORAL | Status: DC | PRN
  Administered 2024-03-16 – 2024-04-01 (×11): 5 mg via ORAL
  Filled 2024-03-16 (×12): qty 1

## 2024-03-16 MED ORDER — ONDANSETRON HCL 4 MG PO TABS: 4.0000 mg | ORAL_TABLET | Freq: Four times a day (QID) | ORAL | Status: DC | PRN

## 2024-03-16 MED ORDER — ONDANSETRON HCL 4 MG/2ML IJ SOLN: 4.0000 mg | Freq: Four times a day (QID) | INTRAMUSCULAR | Status: DC | PRN

## 2024-03-16 MED ORDER — METOPROLOL SUCCINATE ER 25 MG PO TB24
25.0000 mg | ORAL_TABLET | Freq: Every day | ORAL | Status: DC
  Administered 2024-03-17 – 2024-04-02 (×17): 25 mg via ORAL
  Filled 2024-03-16 (×18): qty 1

## 2024-03-16 MED ORDER — TERBINAFINE HCL 250 MG PO TABS
250.0000 mg | ORAL_TABLET | Freq: Every day | ORAL | Status: DC
  Administered 2024-03-17 – 2024-04-02 (×17): 250 mg via ORAL
  Filled 2024-03-16 (×18): qty 1

## 2024-03-16 MED ORDER — OXYCODONE-ACETAMINOPHEN 10-325 MG PO TABS: 1.0000 | ORAL_TABLET | Freq: Three times a day (TID) | ORAL | Status: DC | PRN

## 2024-03-16 MED ORDER — GABAPENTIN 100 MG PO CAPS
400.0000 mg | ORAL_CAPSULE | Freq: Three times a day (TID) | ORAL | Status: DC
  Administered 2024-03-16 – 2024-04-02 (×51): 400 mg via ORAL
  Filled 2024-03-16 (×51): qty 1

## 2024-03-16 MED ORDER — FUROSEMIDE 10 MG/ML IJ SOLN
20.0000 mg | Freq: Once | INTRAMUSCULAR | Status: AC
  Administered 2024-03-16: 20 mg via INTRAVENOUS
  Filled 2024-03-16: qty 4

## 2024-03-16 MED ORDER — ACETAMINOPHEN 325 MG PO TABS
650.0000 mg | ORAL_TABLET | Freq: Four times a day (QID) | ORAL | Status: DC | PRN
  Administered 2024-03-16 – 2024-03-31 (×5): 650 mg via ORAL
  Filled 2024-03-16 (×5): qty 2

## 2024-03-16 MED ORDER — ASPIRIN 81 MG PO TBEC
81.0000 mg | DELAYED_RELEASE_TABLET | Freq: Every day | ORAL | Status: DC
  Administered 2024-03-17 – 2024-04-02 (×17): 81 mg via ORAL
  Filled 2024-03-16 (×17): qty 1

## 2024-03-16 MED ORDER — FUROSEMIDE 10 MG/ML IJ SOLN
40.0000 mg | Freq: Every day | INTRAMUSCULAR | Status: DC
  Administered 2024-03-18 – 2024-03-23 (×6): 40 mg via INTRAVENOUS
  Filled 2024-03-16 (×7): qty 4

## 2024-03-16 MED ORDER — ENOXAPARIN SODIUM 30 MG/0.3ML IJ SOSY
30.0000 mg | PREFILLED_SYRINGE | INTRAMUSCULAR | Status: DC
  Administered 2024-03-16 – 2024-03-22 (×7): 30 mg via SUBCUTANEOUS
  Filled 2024-03-16 (×7): qty 0.3

## 2024-03-16 MED ORDER — ACETAMINOPHEN 650 MG RE SUPP: 650.0000 mg | Freq: Four times a day (QID) | RECTAL | Status: DC | PRN

## 2024-03-16 MED ORDER — OXYCODONE-ACETAMINOPHEN 5-325 MG PO TABS
1.0000 | ORAL_TABLET | Freq: Three times a day (TID) | ORAL | Status: DC | PRN
  Administered 2024-03-19 – 2024-04-01 (×10): 1 via ORAL
  Filled 2024-03-16 (×10): qty 1

## 2024-03-16 NOTE — Assessment & Plan Note (Signed)
 Patient is no longer on anticoagulation.  Mild RVR noted earlier, will restart home metoprolol  and reassess.  - Continue home metoprolol 

## 2024-03-16 NOTE — Assessment & Plan Note (Signed)
-   Hold home antihypertensives given AKI

## 2024-03-16 NOTE — Assessment & Plan Note (Signed)
 Elevated creatinine at 1.44 with previous elevations also noted over the past few months, although there was concern this is due to lack of access to food.  Given hypervolemia, concerned this may be due to progressive cardiac disease.  - Diuretics as noted above - Bladder ultrasound - Strict in and out - Hold other nephrotoxic agents - Repeat BMP in the a.m.
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
## 2024-03-16 NOTE — ED Triage Notes (Signed)
 Pt to ED from home via EMS with bilateral leg edema and weeping for roughly a week and a half. Pt came over to neighbors house without pants on asking for help. Pt lives alone and has a medical power. Pt is A&O x4 in triage.

## 2024-03-16 NOTE — Assessment & Plan Note (Signed)
 Chronic anemia with baseline hemoglobin between 9 and 10, now 7.8.  She has senile purpura, however no other signs of bleeding.  - Transfuse for hemoglobin less than 7 - Check vitamin B12, iron  panel, ferritin

## 2024-03-16 NOTE — ED Notes (Signed)
 Pt was yelling asking for help said she was wet checked her she was dry repositioned her in bed and helped her find her cell phone and gave her the call bell
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
## 2024-03-16 NOTE — Assessment & Plan Note (Signed)
 Severe protein calorie malnutrition with BMI of 17.  Concerned this may be due to lack of access to food.  - Dietitian consulted

## 2024-03-16 NOTE — ED Notes (Signed)
 Assisted pt to bathroom. Pt with unsteady gait and needs 1 person assist. Pt normally walks with walker. Pt tolerated activity without difficulty.

## 2024-03-16 NOTE — ED Notes (Signed)
Pt up to commode to have BM

## 2024-03-16 NOTE — ED Notes (Signed)
 MD notified of 8 beat run of nonsustained VT

## 2024-03-16 NOTE — Assessment & Plan Note (Signed)
 History of CAD s/p PCI with DES to mid LAD in 2021.  Patient denies any chest pain at this time.  - Continue home aspirin 
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
## 2024-03-16 NOTE — Assessment & Plan Note (Signed)
 Per chart review, patient has a history of HFrEF with EF of 30% when admitted in 2021 for NSTEMI.  Limited follow-up with cardiology since that time and she is not on diuretics at home.  Although she does have severe venous insufficiency with associated edema, her edema extends up to the sacrum with evidence of pulmonary edema as well.  - Telemetry monitoring - Echocardiogram ordered - Start Lasix  40 mg daily - Daily BMP and magnesium - Strict in and out - Daily weights - Once family is at bedside, will need to have a goals of care discussion

## 2024-03-17 DIAGNOSIS — N179 Acute kidney failure, unspecified: Secondary | ICD-10-CM | POA: Diagnosis not present

## 2024-03-17 DIAGNOSIS — R0609 Other forms of dyspnea: Secondary | ICD-10-CM | POA: Diagnosis not present

## 2024-03-17 DIAGNOSIS — I25118 Atherosclerotic heart disease of native coronary artery with other forms of angina pectoris: Secondary | ICD-10-CM

## 2024-03-17 DIAGNOSIS — R4189 Other symptoms and signs involving cognitive functions and awareness: Secondary | ICD-10-CM

## 2024-03-17 DIAGNOSIS — I482 Chronic atrial fibrillation, unspecified: Secondary | ICD-10-CM

## 2024-03-17 DIAGNOSIS — I5023 Acute on chronic systolic (congestive) heart failure: Secondary | ICD-10-CM

## 2024-03-17 DIAGNOSIS — Z515 Encounter for palliative care: Secondary | ICD-10-CM

## 2024-03-17 LAB — BASIC METABOLIC PANEL WITH GFR
Anion gap: 7 (ref 5–15)
BUN: 28 mg/dL — ABNORMAL HIGH (ref 8–23)
CO2: 26 mmol/L (ref 22–32)
Calcium: 8.2 mg/dL — ABNORMAL LOW (ref 8.9–10.3)
Chloride: 106 mmol/L (ref 98–111)
Creatinine, Ser: 1.36 mg/dL — ABNORMAL HIGH (ref 0.44–1.00)
GFR, Estimated: 38 mL/min — ABNORMAL LOW (ref >60)
Glucose, Bld: 104 mg/dL — ABNORMAL HIGH (ref 70–99)
Potassium: 4.6 mmol/L (ref 3.5–5.1)
Sodium: 139 mmol/L (ref 135–145)

## 2024-03-17 LAB — CBC
HCT: 23.4 % — ABNORMAL LOW (ref 36.0–46.0)
Hemoglobin: 6.9 g/dL — ABNORMAL LOW (ref 12.0–15.0)
MCH: 26.3 pg (ref 26.0–34.0)
MCHC: 29.5 g/dL — ABNORMAL LOW (ref 30.0–36.0)
MCV: 89.3 fL (ref 80.0–100.0)
Platelets: 261 10*3/uL (ref 150–400)
RBC: 2.62 MIL/uL — ABNORMAL LOW (ref 3.87–5.11)
RDW: 15.6 % — ABNORMAL HIGH (ref 11.5–15.5)
WBC: 7.5 10*3/uL (ref 4.0–10.5)
nRBC: 0 % (ref 0.0–0.2)

## 2024-03-17 LAB — ECHOCARDIOGRAM COMPLETE
AR max vel: 1.08 cm2
AV Area VTI: 0.79 cm2
AV Area mean vel: 1.08 cm2
AV Mean grad: 6.1 mmHg
AV Peak grad: 11.3 mmHg
Ao pk vel: 1.68 m/s
S' Lateral: 2 cm

## 2024-03-17 LAB — PREPARE RBC (CROSSMATCH)

## 2024-03-17 LAB — VITAMIN B12: Vitamin B-12: 693 pg/mL (ref 180–914)

## 2024-03-17 LAB — MAGNESIUM: Magnesium: 2.2 mg/dL (ref 1.7–2.4)

## 2024-03-17 MED ORDER — SODIUM CHLORIDE 0.9% IV SOLUTION
Freq: Once | INTRAVENOUS | Status: AC
  Filled 2024-03-17: qty 250

## 2024-03-17 NOTE — ED Notes (Signed)
 Pt sleeping.

## 2024-03-17 NOTE — ED Notes (Signed)
 Assisted patient with sitting up onto side of the bed for breakfast. This tech set up breakfast tray for pt. No other needs at this time.
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
## 2024-03-17 NOTE — ED Notes (Signed)
 OT at bedside.

## 2024-03-17 NOTE — TOC Initial Note (Incomplete)
 Transition of Care [facility]) - Initial/Assessment Note    Patient Details  Name: [name] [name] MRN: [id number] Date of Birth: 03-07-1935  Transition of Care [facility]) CM/SW Contact:    Elmira Haddock, LCSW Phone Number: 03/17/2024, 2:24 PM  Clinical Narrative: Per patient's sister, Benjamin [employer]ands [phone]), she is patient's HCPOA.  CSW asked her to bring I paperwork to be added to patient's chart.  Adriana Hopping states that patient lives alone and receives In-home Hospice care from [facility].  Adriana Hopping states that [facility] has been going to patient's home and changing dressing on legs.    CSW discussed recommendation for SNF for patient and Adriana Hopping is amenable.   Adriana Hopping states that she, her daughter and husband have been trying to help patient at home and she feels like patient could benefit from rehab and possibly long term care after rehab.  Patient currently uses a cane.                     Patient Goals and CMS Choice            Expected Discharge Plan and Services                                              Prior Living Arrangements/Services                       Activities of Daily Living      Permission Sought/Granted                  Emotional Assessment              Admission diagnosis:  Acute on chronic HFrEF (heart failure with reduced ejection fraction) (HCC) [I50.23] Patient Active Problem List   Diagnosis Date Noted   Acute on chronic HFrEF (heart failure with reduced ejection fraction) (HCC) 03/16/2024   Cognitive decline 03/16/2024   Normocytic anemia 03/16/2024   Failure to thrive in adult 03/05/2024   Autonomic neuropathy in diseases classified elsewhere 10/11/2023   Open wound of left lower leg 03/28/2023   Open wound of lower leg, right, subsequent encounter 03/28/2023   PVD (peripheral vascular disease) (HCC) 02/25/2023   Angiodysplasia of stomach 02/24/2023   Acute blood loss anemia 02/21/2023   Toe necrosis (HCC) 02/21/2023    Cellulitis 02/21/2023   Cellulitis of multiple sites of lower extremity 02/20/2023   AKI (acute kidney injury) (HCC) 02/20/2023   Venous insufficiency (chronic) (peripheral) 02/04/2023   Chronic systolic (congestive) heart failure (HCC) 01/12/2023   Coronary artery disease of native artery of native heart with stable angina pectoris (HCC) 01/05/2023   Atrial fibrillation, chronic (HCC) 08/16/2021   Protein-calorie malnutrition, severe (HCC) 06/13/2020   Hyponatremia 06/11/2020   Tobacco abuse 06/11/2020   Chronic low back pain (1ry area of Pain) (Bilateral) (R>L) w/ sciatica (Bilateral) 01/12/2020   Chronic lower extremity pain (2ry area of Pain) (Bilateral) (R>L) 01/12/2020   DDD (degenerative disc disease), lumbosacral 01/12/2020   Walker as ambulation aid 01/12/2020   Chronic pain syndrome 01/12/2020   Pain in joint, pelvic region and thigh 01/05/2020   Idiopathic scoliosis and kyphoscoliosis 12/03/2019   Hypertension 08/02/2019   Vitamin D  deficiency 08/02/2019   Peripheral autonomic neuropathy 08/02/2019   Hyperlipidemia 08/02/2019   PCP:  Trenda Frisk, FNP Pharmacy:   [id number] -  [place], [place] - [address] [place] [address] Phone: [phone] Fax: [phone]     Social Drivers of Health (SDOH) Social History: SDOH Screenings   Food Insecurity: No Food Insecurity (10/29/2023)  Housing: Low Risk  (10/22/2023)  Transportation Needs: No Transportation Needs (10/29/2023)  Utilities: Not At Risk (10/29/2023)  Depression (PHQ2-9): Low Risk  (10/29/2023)  Physical Activity: Insufficiently Active (12/25/2022)  Social Connections: Socially Isolated (12/25/2022)  Stress: Stress Concern Present (12/25/2022)  Tobacco Use: Medium Risk (03/16/2024)   SDOH Interventions:     Readmission Risk Interventions     No data to display

## 2024-03-17 NOTE — Consult Note (Signed)
 Consultation Note Date: 03/17/2024 at 1200  Patient Name: [name] [name]  DOB: 24-Feb-1935  MRN: [id number]  Age / Sex: 89 y.o., female  PCP: Trenda Frisk, FNP Referring Physician: Ezzard Holms, MD  HPI/Patient Profile: 89 y.o. female  with past medical history of HFrEF with last EF of 30-35% (2021), facial basal cell carcinoma s/p radiation (2025), atrial fibrillation, CAD s/p DES to mid LAD (2021), venous insufficiency, failure to thrive  admitted on 03/16/2024 with leg swelling.  Of note, patient visited her PCP with her [name] on 03/02/2024, at which time [name] noted that patient has been declining, becoming more forgetful, and family was concerned she was no longer safe to be living independently.  Patient is being treated for acute on chronic HFrEF (EF of 30%), AKI, cognitive decline, malnutrition, chronic A-fib, and hypertension.   Clinical Assessment and Goals of Care: Extensive chart review completed prior to meeting patient including labs, vital signs, imaging, progress notes, orders, and available advanced directive documents from current and previous encounters. I then met with patient at bedside to discuss diagnosis prognosis, GOC, EOL wishes, disposition and options.  I introduced Palliative Medicine as specialized medical care for people living with serious illness. It focuses on providing relief from the symptoms and stress of a serious illness. The goal is to improve quality of life for both the patient and the family.  I attempted to gauge patient's understanding of her current medical status and her cognitive faculties.  Mini-Mental status exam attempted to be performed.  Patient was unable to answer the first for questions and stopped answering stating, "this is just making me mad".  She endorses being forgetful, not remembering things, and becoming very frustrated when she cannot  recall facts that she wants could.  We discussed a brief life review of the patient.  She is originally from [address] [place] .  She worked as a Interior and spatial designer.  She was never married and has no children.  She has 1 sister Adriana Hopping, who lived in [place]  and is the reason why patient relocated to [place]  5 years ago.  Patient endorses she lives home alone but declines to give further details of how she completes her ADLs, who helps her, and how she gets meals/medications.  Of note, patient repeatedly and continuously states "I'm so mad at her" and "I hate my sister's guts".  Asked to elaborate about her anger and frustration towards her sister, patient states "I wish I could".  Patient unable to participate in goals of care or medical decision making independently at this time due to significant cognitive deficits.  After visiting with the patient, spoke with patient's Sister Adriana Hopping over the phone.  Adriana Hopping shares that she and her husband check on the patient twice a day, cleaning her home, provide her meals, and carry her to her medical appointments.  Adriana Hopping shares the patient becomes increasingly angry with Adriana Hopping but likes her husband better, so he often times visits more often the [place].  Additionally, Adriana Hopping shares  that patient's home remains dirty since patient pees in her chair, on her floor, and refuses to wear adult Pampers.  She and her husband are continuously cleaning the house but that recently it is started to affect [name]'s health.  Adriana Hopping shares concern that patient is unable to live alone anymore.  She shares she is spoken with clinical social worker and is in agreement with plan to have patient discharged to a skilled nursing facility for rehab.  We discussed patient's current illness and what it means in the larger context of patient's on-going co-morbidities.  Education provided on dementia as a chronic, progressive, and irreversible disease that is oftentimes exacerbated by acute  illnesses and exacerbations.  Adriana Hopping shares she has seen a significant decline in patient's mental and physical wellbeing lately.    I attempted to elicit values and goals of care important to the patient. Advance directives, concepts specific to code status, artificial feeding and hydration, and rehospitalization were considered and discussed.  Adriana Hopping states that Dhatri has always maintained that she would want to have a natural and peaceful passing.  Adriana Hopping states Ruya has been clear several times in the past that she would never want any life prolonging measures.  Difference between DNR, DNI, and full CODE STATUS reviewed in detail.  Adriana Hopping shares that she is patient's healthcare power of Geneticist, molecular.  She would like to make patient DNR with limited interventions.  CODE STATUS changed in MAR.  Medical team made aware of adjustment to CODE STATUS.  Discussed with Adriana Hopping the importance of continued conversation with family and the medical providers regarding overall plan of care and treatment options, ensuring decisions are within the context of the patient's values and GOCs.    Questions and concerns were addressed. The family was encouraged to call with questions or concerns.  [name] agreed for PMT to continue to follow.  I plan to follow-up with patient and family tomorrow.  Primary Decision Maker HCPOA  Physical Exam Vitals reviewed.  Constitutional:      General: She is not in acute distress. HENT:     Head: Normocephalic.     Mouth/Throat:     Mouth: Mucous membranes are moist.  Eyes:     Pupils: Pupils are equal, round, and reactive to light.  Pulmonary:     Effort: Pulmonary effort is normal.  Abdominal:     Palpations: Abdomen is soft.  Musculoskeletal:     Comments: MAETC, generalized weakness  Skin:    General: Skin is warm and dry.  Neurological:     Mental Status: She is alert.     Comments: Oriented to self  Psychiatric:        Mood and Affect: Mood normal.         Behavior: Behavior normal.        Judgment: Judgment normal.     Palliative Assessment/Data: 50%     Thank you for this consult. Palliative medicine will continue to follow and assist holistically.   Time Total: 75 minutes  Time spent includes: Detailed review of medical records (labs, imaging, vital signs), medically appropriate exam (mental status, respiratory, cardiac, skin), discussed with treatment team, counseling and educating patient, family and staff, documenting clinical information, medication management and coordination of care.  Signed by: Eller Gut, DNP, FNP-BC Palliative Medicine   Please contact Palliative Medicine Team providers via [facility] for questions and concerns.

## 2024-03-17 NOTE — ED Notes (Signed)
 MD at bedside assessing patient

## 2024-03-17 NOTE — ED Notes (Signed)
 Per MD, sister (next of kin) agreeable to blood transfusion.

## 2024-03-17 NOTE — ED Notes (Signed)
 Pt assisted standing and sitting on bedside commode.

## 2024-03-17 NOTE — Evaluation (Signed)
 Physical Therapy Evaluation Patient Details Name: [name] [name] MRN: [id number] DOB: 12-11-1934 Today's Date: 03/17/2024  History of Present Illness  [name] [name] is a 89 y.o. female with medical history significant of HFrEF with last EF of 30-35% (2021), facial basal cell carcinoma s/p radiation (2025), atrial fibrillation, CAD s/p DES to mid LAD (2021), venous insufficiency, failure to thrive, who presents to the ED due to leg swelling.  Clinical Impression  Patient received in bed, she is pleasant, confused. Agrees to PT assessment. Patient is mod I for bed mobility with cues. She attempted to stand with +1 assist. Poor standing balance with posterior leaning. Patient then returned self to sitting. She will continue to benefit from skilled PT to improve strength, endurance and safety with mobility.         If plan is discharge home, recommend the following: A lot of help with walking and/or transfers;A lot of help with bathing/dressing/bathroom   Can travel by private vehicle   No    Equipment Recommendations None recommended by PT  Recommendations for Other Services       Functional Status Assessment Patient has had a recent decline in their functional status and demonstrates the ability to make significant improvements in function in a reasonable and predictable amount of time.     Precautions / Restrictions Precautions Precautions: Fall Recall of Precautions/Restrictions: Impaired Restrictions Weight Bearing Restrictions Per Provider Order: No      Mobility  Bed Mobility Overal bed mobility: Modified Independent             General bed mobility comments: no physical assist provided to get oob    Transfers Overall transfer level: Needs assistance Equipment used: 1 person hand held assist Transfers: Sit to/from Stand Sit to Stand: Max assist           General transfer comment: patient unable to get fully standing with +1 assist. Posterior leaning and  returned self to sitting edge of bed, stating " i can't"    Ambulation/Gait               General Gait Details: unable  Stairs            Wheelchair Mobility     Tilt Bed    Modified Rankin (Stroke Patients Only)       Balance Overall balance assessment: Needs assistance, History of Falls Sitting-balance support: Feet supported Sitting balance-Leahy Scale: Good     Standing balance support: Single extremity supported, During functional activity Standing balance-Leahy Scale: Zero Standing balance comment: unable to achieve standing balance                             Pertinent Vitals/Pain Pain Assessment Pain Assessment: PAINAD Breathing: normal Negative Vocalization: none Facial Expression: smiling or inexpressive Body Language: relaxed Consolability: no need to console PAINAD Score: 0 Pain Intervention(s): Monitored during session    Home Living Family/patient expects to be discharged to:: Private residence Living Arrangements: Alone Available Help at Discharge: Family;Available PRN/intermittently Type of Home: Apartment Home Access: Stairs to enter Entrance Stairs-Rails: Doctor, general practice of Steps: 9   Home Layout: One level Home Equipment: Agricultural consultant (2 wheels)      Prior Function Prior Level of Function : Independent/Modified Independent;Driving             Mobility Comments: unsure, patient is poor historian, states she drove to [employer] for shopping and fell in parking lot. ADLs Comments: sister  and BIL bring food intermittently. Has friend that comes every other week to assist with some things in home. Driving until a few weeks ago and sister is getting groceries. 1 fall this year.     Extremity/Trunk Assessment   Upper Extremity Assessment Upper Extremity Assessment: Defer to OT evaluation    Lower Extremity Assessment Lower Extremity Assessment: Generalized weakness    Cervical / Trunk  Assessment Cervical / Trunk Assessment: Kyphotic  Communication   Communication Communication: No apparent difficulties    Cognition Arousal: Alert Behavior During Therapy: WFL for tasks assessed/performed   PT - Cognitive impairments: History of cognitive impairments, Awareness, Memory, Orientation, Attention, Problem solving, Safety/Judgement   Orientation impairments: Place, Time, Situation                     Following commands: Intact       Cueing Cueing Techniques: Verbal cues     General Comments      Exercises     Assessment/Plan    PT Assessment Patient needs continued PT services  PT Problem List Decreased strength;Decreased range of motion;Decreased activity tolerance;Decreased balance;Decreased mobility;Decreased coordination;Decreased cognition;Decreased safety awareness;Decreased skin integrity       PT Treatment Interventions DME instruction;Gait training;Stair training;Functional mobility training;Therapeutic activities;Therapeutic exercise;Balance training;Neuromuscular re-education;Cognitive remediation;Patient/family education    PT Goals (Current goals can be found in the Care Plan section)  Acute Rehab PT Goals PT Goal Formulation: Patient unable to participate in goal setting Time For Goal Achievement: 03/31/24    Frequency       Co-evaluation               AM-PAC PT "6 Clicks" Mobility  Outcome Measure Help needed turning from your back to your side while in a flat bed without using bedrails?: A Little Help needed moving from lying on your back to sitting on the side of a flat bed without using bedrails?: A Little Help needed moving to and from a bed to a chair (including a wheelchair)?: Total Help needed standing up from a chair using your arms (e.g., wheelchair or bedside chair)?: A Lot Help needed to walk in hospital room?: Total Help needed climbing 3-5 steps with a railing? : Total 6 Click Score: 11    End of Session    Activity Tolerance: Patient tolerated treatment well Patient left: in bed;with call bell/phone within reach;with bed alarm set Nurse Communication: Mobility status PT Visit Diagnosis: Unsteadiness on feet (R26.81);Other abnormalities of gait and mobility (R26.89);Muscle weakness (generalized) (M62.81);Difficulty in walking, not elsewhere classified (R26.2);History of falling (Z91.81)    Time: 2130-8657 PT Time Calculation (min) (ACUTE ONLY): 9 min   Charges:   PT Evaluation $PT Eval Low Complexity: 1 Low   PT General Charges $$ ACUTE PT VISIT: 1 Visit         Jameshia Hayashida, PT, GCS 03/17/24,11:50 AM

## 2024-03-17 NOTE — NC FL2 (Signed)
 [place]  MEDICAID FL2 LEVEL OF CARE FORM     IDENTIFICATION  Patient Name: [name] [name] Birthdate: Jun 30, 1935 Sex: female Admission Date (Current Location): 03/16/2024  [facility] and [place] Number:  Chiropodist and Address:  [facility], [address], [place], [place] [address]      Provider Number: [id number]  Attending Physician Name and Address:  Ezzard Holms, MD  Relative Name and Phone Number:  Toy Freund [phone]    Current Level of Care: Hospital Recommended Level of Care: Skilled Nursing Facility Prior Approval Number:    Date Approved/Denied:   PASRR Number: [id number]  Discharge Plan: SNF    Current Diagnoses: Patient Active Problem List   Diagnosis Date Noted   Acute on chronic HFrEF (heart failure with reduced ejection fraction) (HCC) 03/16/2024   Cognitive decline 03/16/2024   Normocytic anemia 03/16/2024   Failure to thrive in adult 03/05/2024   Autonomic neuropathy in diseases classified elsewhere 10/11/2023   Open wound of left lower leg 03/28/2023   Open wound of lower leg, right, subsequent encounter 03/28/2023   PVD (peripheral vascular disease) (HCC) 02/25/2023   Angiodysplasia of stomach 02/24/2023   Acute blood loss anemia 02/21/2023   Toe necrosis (HCC) 02/21/2023   Cellulitis 02/21/2023   Cellulitis of multiple sites of lower extremity 02/20/2023   AKI (acute kidney injury) (HCC) 02/20/2023   Venous insufficiency (chronic) (peripheral) 02/04/2023   Chronic systolic (congestive) heart failure (HCC) 01/12/2023   Coronary artery disease of native artery of native heart with stable angina pectoris (HCC) 01/05/2023   Atrial fibrillation, chronic (HCC) 08/16/2021   Protein-calorie malnutrition, severe (HCC) 06/13/2020   Hyponatremia 06/11/2020   Tobacco abuse 06/11/2020   Chronic low back pain (1ry area of Pain) (Bilateral) (R>L) w/ sciatica (Bilateral) 01/12/2020   Chronic lower extremity  pain (2ry area of Pain) (Bilateral) (R>L) 01/12/2020   DDD (degenerative disc disease), lumbosacral 01/12/2020   Walker as ambulation aid 01/12/2020   Chronic pain syndrome 01/12/2020   Pain in joint, pelvic region and thigh 01/05/2020   Idiopathic scoliosis and kyphoscoliosis 12/03/2019   Hypertension 08/02/2019   Vitamin D  deficiency 08/02/2019   Peripheral autonomic neuropathy 08/02/2019   Hyperlipidemia 08/02/2019    Orientation RESPIRATION BLADDER Height & Weight     Self, Place  Normal Continent Weight:   Height:     BEHAVIORAL SYMPTOMS/MOOD NEUROLOGICAL BOWEL NUTRITION STATUS      Incontinent Diet (Yes; Fluid consistency: Thin: General)  AMBULATORY STATUS COMMUNICATION OF NEEDS Skin   Limited Assist Verbally Other (Comment) (Jaundice.  At risk for pressure injury.  At risk requirements implemented.)                       Personal Care Assistance Level of Assistance  Bathing, Feeding, Dressing Bathing Assistance: Limited assistance Feeding assistance: Limited assistance Dressing Assistance: Limited assistance     Functional Limitations Info  Sight, Hearing, Speech Sight Info: Adequate Hearing Info: Adequate Speech Info: Adequate    SPECIAL CARE FACTORS FREQUENCY  PT (By licensed PT), OT (By licensed OT)     PT Frequency: 5 times per week OT Frequency: 5 times per week            Contractures Contractures Info: Not present    Additional Factors Info  Code Status, Allergies Code Status Info: full Allergies Info: Fluogen (Influenza Virus Vaccine); Statins; Sulfa Antibiotics           Current Medications (03/17/2024):  This is the current hospital active medication list Current Facility-Administered Medications  Medication Dose Route Frequency Provider Last Rate Last Admin   0.9 %  sodium chloride  infusion (Manually program via Guardrails IV Fluids)   Intravenous Once Djan, Prince T, MD       acetaminophen  (TYLENOL ) tablet 650 mg  650 mg Oral Q6H PRN  Basaraba, Iulia, MD   650 mg at 03/16/24 2008   Or   acetaminophen  (TYLENOL ) suppository 650 mg  650 mg Rectal Q6H PRN Avi Body, MD       aspirin  EC tablet 81 mg  81 mg Oral Daily Avi Body, MD   81 mg at 03/17/24 0954   enoxaparin (LOVENOX) injection 30 mg  30 mg Subcutaneous Q24H Basaraba, Iulia, MD   30 mg at 03/16/24 2222   ferrous sulfate tablet 325 mg  325 mg Oral Daily Avi Body, MD   325 mg at 03/17/24 4098   furosemide  (LASIX ) injection 40 mg  40 mg Intravenous Daily Avi Body, MD       gabapentin  (NEURONTIN ) capsule 400 mg  400 mg Oral TID Basaraba, Iulia, MD   400 mg at 03/17/24 1191   metoprolol  succinate (TOPROL -XL) 24 hr tablet 25 mg  25 mg Oral Daily Basaraba, Iulia, MD   25 mg at 03/17/24 4782   ondansetron  (ZOFRAN ) tablet 4 mg  4 mg Oral Q6H PRN Avi Body, MD       Or   ondansetron  (ZOFRAN ) injection 4 mg  4 mg Intravenous Q6H PRN Avi Body, MD       oxyCODONE -acetaminophen  (PERCOCET/ROXICET) 5-325 MG per tablet 1 tablet  1 tablet Oral Q8H PRN Avi Body, MD       And   oxyCODONE  (Oxy IR/ROXICODONE ) immediate release tablet 5 mg  5 mg Oral Q8H PRN Basaraba, Iulia, MD   5 mg at 03/17/24 0648   polyethylene glycol (MIRALAX / GLYCOLAX) packet 17 g  17 g Oral Daily PRN Avi Body, MD       sodium chloride  flush (NS) 0.9 % injection 3 mL  3 mL Intravenous Q12H Avi Body, MD   3 mL at 03/17/24 0936   terbinafine  (LAMISIL ) tablet 250 mg  250 mg Oral Daily Basaraba, Iulia, MD   250 mg at 03/17/24 9562   Current Outpatient Medications  Medication Sig Dispense Refill   aspirin  EC 81 MG tablet Take 1 tablet (81 mg total) by mouth daily. Swallow whole. 30 tablet 0   Ferrous Sulfate (IRON ) 325 (65 Fe) MG TABS TAKE 1 TABLET BY MOUTH ONCE DAILY 30 tablet 8   gabapentin  (NEURONTIN ) 400 MG capsule TAKE 1 CAPSULE BY MOUTH FOUR TIMES DAILY 120 capsule 8   lisinopril  (ZESTRIL ) 5 MG tablet Take 5 mg by mouth daily.     meloxicam (MOBIC) 15 MG  tablet TAKE 1 TABLET BY MOUTH ONCE DAILY 30 tablet 8   metoprolol  succinate (TOPROL -XL) 25 MG 24 hr tablet TAKE 1 TABLET BY MOUTH ONCE DAILY 30 tablet 8   oxyCODONE -acetaminophen  (PERCOCET) 10-325 MG tablet Take 1 tablet by mouth every 6 (six) hours as needed. 120 tablet 0   terbinafine  (LAMISIL ) 250 MG tablet Take 250 mg by mouth daily.     erythromycin ophthalmic ointment Place 3.5 g into both eyes as needed.     feeding supplement (ENSURE ENLIVE / ENSURE PLUS) LIQD Take 237 mLs by mouth 2 (two) times daily between meals. 237 mL 12   ondansetron  (ZOFRAN -ODT) 4 MG disintegrating tablet Take 1 tablet (  4 mg total) by mouth every 8 (eight) hours as needed for nausea or vomiting. 20 tablet 0   valsartan  (DIOVAN ) 40 MG tablet Take 1 tablet (40 mg total) by mouth 2 (two) times daily. (Patient not taking: Reported on 03/16/2024) 60 tablet 1     Discharge Medications: Please see discharge summary for a list of discharge medications.  Relevant Imaging Results:  Relevant Lab Results:   Additional Information    Shalika Arntz D Lavita Pontius, LCSW

## 2024-03-17 NOTE — ED Notes (Signed)
 Called CCMD and added pt to monitoring board

## 2024-03-17 NOTE — ED Notes (Signed)
Assisted pt to [facility]   Tolerated well

## 2024-03-17 NOTE — Progress Notes (Signed)
 Progress Note   Patient: [name] [name] ZOX:[id number] DOB: 1934/11/29 DOA: 03/16/2024     1 DOS: the patient was seen and examined on 03/17/2024   Brief hospital course: From HPI "[name] [name] is a 89 y.o. female with medical history significant of HFrEF with last EF of 30-35% (2021), facial basal cell carcinoma s/p radiation (2025), atrial fibrillation, CAD s/p DES to mid LAD (2021), venous insufficiency, failure to thrive, who presents to the ED due to leg swelling. Patient saw her PCP with her sister on 03/02/2024, at which time her sister noted that patient had been declining, becoming more forgetful, and family was worried she is no longer safe to be living independently.   ED course: On arrival to the ED, patient was hypertensive at 145/90 with heart rate of 115.  She was saturating at 100% on room air.  She was afebrile 97.9.  Initial workup notable for hemoglobin of 7.8, bicarb 21, BUN 25, creatinine 1.44, GFR 35.  BNP 919.  Troponin 24 and then 22.  Chest x-ray with mild bibasilar atelectasis with associated pleural effusions.  Patient started on IV Lasix  and TRH contacted for admission.  "     Assessment and Plan:  Acute on chronic HFrEF (heart failure with reduced ejection fraction) (HCC) Per chart review, patient has a history of HFrEF with EF of 30% when admitted in 2021 for NSTEMI.   Continue telemetry Continue Lasix  Follow-up on echocardiogram Monitor input and output Palliative care consulted for goals of care discussion   AKI (acute kidney injury) (HCC) Elevated creatinine at 1.44 with previous elevations also noted over the past few months, although there was concern this is due to lack of access to food.  Given hypervolemia, concerned this may be due to progressive cardiac disease. Follow-up on bladder ultrasound We will monitor renal function closely given diuresis   Cognitive decline TOC on board Palliative care on board Continue delirium precaution   Normocytic  anemia Chronic anemia with baseline hemoglobin between 9 and 10 however presented with hemoglobin 7.8 - Transfuse for hemoglobin less than 7 Follow-up vitamin B12, iron  panel, ferritin   Protein-calorie malnutrition, severe (HCC) Severe protein calorie malnutrition with BMI of 17.   Concerned this may be due to lack of access to food.   - Dietitian consulted   Coronary artery disease of native artery of native heart with stable angina pectoris (HCC) History of CAD s/p PCI with DES to mid LAD in 2021.  Patient denies any chest pain at this time. Continue home aspirin    Atrial fibrillation, chronic (HCC) Patient is no longer on anticoagulation.   - Continue home metoprolol    Hypertension - Hold home antihypertensives given AKI   Advance Care Planning:   Code Status: Full Code as patient is unable to make medical decisions and no surrogate at bedside   Consults: None   Family Communication: No family at bedside   Subjective:    Patient seen and examined at bedside this morning Denies nausea vomiting abdominal pain chest pain cough  Physical Exam: Vitals and nursing note reviewed.  Constitutional:      General: She is not in acute distress.    Appearance: She is not toxic-appearing.  HENT:     Head: Normocephalic and atraumatic.  Eyes:     Conjunctiva/sclera: Conjunctivae normal.     Pupils: Pupils are equal, round, and reactive to light.  Cardiovascular: Heart sounds 1 and 2 present irregular rhythm Pulmonary: Decreased air entry bibasilarly Abdominal:  Tenderness: There is no abdominal tenderness.  Skin:    General: Skin is warm and dry.     Comments: Senile purpura  Neurological:     Mental Status: She is alert.  However only oriented to place and person but disoriented to time Psychiatric:        Mood and Affect: Mood and affect normal.      Vitals:   03/17/24 1330 03/17/24 1406 03/17/24 1408 03/17/24 1425  BP: (!) 112/55 97/74 97/74  104/65  Pulse: 83 74 74  74  Resp:  16 16 14   Temp:  97.9 F (36.6 C) 97.9 F (36.6 C) 97.8 F (36.6 C)  TempSrc:  Oral Oral Oral  SpO2: 98% 95%  96%    Data Reviewed: Chest x-ray reviewed by me showed findings of mild bibasilar atelectasis    Latest Ref Rng & Units 03/17/2024    4:56 AM 03/16/2024   10:42 AM 01/29/2024   11:14 AM  CBC  WBC 4.0 - 10.5 K/uL 7.5  6.6  5.5   Hemoglobin 12.0 - 15.0 g/dL 6.9  7.8  45.4   Hematocrit 36.0 - 46.0 % 23.4  26.9  31.9   Platelets 150 - 400 K/uL 261  286  311        Latest Ref Rng & Units 03/17/2024    4:56 AM 03/16/2024   10:42 AM 01/29/2024   11:14 AM  BMP  Glucose 70 - 99 mg/dL 098  119  147   BUN 8 - 23 mg/dL 28  25  41   Creatinine 0.44 - 1.00 mg/dL 8.29  5.62  1.30   Sodium 135 - 145 mmol/L 139  136  135   Potassium 3.5 - 5.1 mmol/L 4.6  4.5  4.3   Chloride 98 - 111 mmol/L 106  106  105   CO2 22 - 32 mmol/L 26  21  22    Calcium  8.9 - 10.3 mg/dL 8.2  8.1  8.3      Family Communication: Spoke with patient's sister over the phone  Disposition: Status is: Inpatient  Time spent: 55 minutes  Author: Ezzard Holms, MD 03/17/2024 3:28 PM  For on call review [email or web address].

## 2024-03-17 NOTE — Evaluation (Signed)
 Occupational Therapy Evaluation Patient Details Name: [name] [name] MRN: [id number] DOB: 09-09-1935 Today's Date: 03/17/2024   History of Present Illness   [name] [name] is a 89 y.o. female with medical history significant of HFrEF with last EF of 30-35% (2021), facial basal cell carcinoma s/p radiation (2025), atrial fibrillation, CAD s/p DES to mid LAD (2021), venous insufficiency, failure to thrive, who presents to the ED due to leg swelling.     Clinical Impressions Patient presenting with decreased Ind in self care, balance, functional mobility/transfers, endurance, and safety awareness. Patient reports living at home alone and being Ind at baseline. She endorses that her sister and brother in law bring meals and they will no longer allow her to drive secondary to recent fall.  Patient is very confused and states it is "1987" and requesting staff to remove B LE wraps and IV. Pt performs bed mobility with mod A and stands with max A to take several side steps. MD present in room as therapist exits. Pt is far from baseline. Patient will benefit from acute OT to increase overall independence in the areas of ADLs, functional mobility, and safety awareness in order to safely discharge.     If plan is discharge home, recommend the following:   A lot of help with walking and/or transfers;A lot of help with bathing/dressing/bathroom;Assistance with cooking/housework;Assist for transportation;Help with stairs or ramp for entrance;Supervision due to cognitive status;Direct supervision/assist for financial management;Direct supervision/assist for medications management     Functional Status Assessment   Patient has had a recent decline in their functional status and demonstrates the ability to make significant improvements in function in a reasonable and predictable amount of time.     Equipment Recommendations   Other (comment) (defer to next venue of care)       Precautions/Restrictions   Precautions Precautions: Fall Recall of Precautions/Restrictions: Impaired Restrictions Weight Bearing Restrictions Per Provider Order: No     Mobility Bed Mobility Overal bed mobility: Modified Independent                  Transfers Overall transfer level: Needs assistance Equipment used: 1 person hand held assist Transfers: Sit to/from Stand Sit to Stand: Max assist                  Balance Overall balance assessment: Needs assistance, History of Falls Sitting-balance support: Feet supported Sitting balance-Leahy Scale: Good       Standing balance-Leahy Scale: Zero                             ADL either performed or assessed with clinical judgement   ADL Overall ADL's : Needs assistance/impaired                     Lower Body Dressing: Maximal assistance;Sit to/from stand   Toilet Transfer: Moderate assistance Toilet Transfer Details (indicate cue type and reason): simulated                 Vision Patient Visual Report: No change from baseline              Pertinent Vitals/Pain Pain Assessment Pain Assessment: Faces Faces Pain Scale: Hurts little more Pain Location: B LEs Pain Descriptors / Indicators: Discomfort Pain Intervention(s): Limited activity within patient's tolerance, Monitored during session, Repositioned     Extremity/Trunk Assessment Upper Extremity Assessment Upper Extremity Assessment: Generalized weakness   Lower Extremity Assessment Lower Extremity  Assessment: Generalized weakness       Communication Communication Communication: Impaired Factors Affecting Communication: Hearing impaired   Cognition Arousal: Alert Behavior During Therapy: WFL for tasks assessed/performed Cognition: Cognition impaired   Orientation impairments: Time, Situation Awareness: Intellectual awareness impaired, Online awareness impaired   Attention impairment (select first  level of impairment): Sustained attention Executive functioning impairment (select all impairments): Sequencing, Reasoning, Problem solving                   Following commands: Intact       Cueing  General Comments   Cueing Techniques: Verbal cues              Home Living Family/patient expects to be discharged to:: Skilled nursing facility Living Arrangements: Alone Available Help at Discharge: Family;Available PRN/intermittently Type of Home: Apartment Home Access: Stairs to enter Entrance Stairs-Number of Steps: 9 Entrance Stairs-Rails: Right;Left Home Layout: One level     Bathroom Shower/Tub: Sponge bathes at baseline   Bathroom Toilet: Standard     Home Equipment: Agricultural consultant (2 wheels)          Prior Functioning/Environment Prior Level of Function : Independent/Modified Independent;Driving               ADLs Comments: sister and BIL bring food intermittently. Has friend that comes every other week to assist with some things in home. Driving until a few weeks ago and sister is getting groceries. 1 fall this year.    OT Problem List: Decreased strength;Decreased activity tolerance;Decreased safety awareness;Impaired balance (sitting and/or standing);Decreased knowledge of use of DME or AE;Decreased knowledge of precautions   OT Treatment/Interventions: Self-care/ADL training;Therapeutic exercise;Therapeutic activities;Energy conservation;Visual/perceptual remediation/compensation;DME and/or AE instruction;Patient/family education;Balance training      OT Goals(Current goals can be found in the care plan section)   Acute Rehab OT Goals Patient Stated Goal: to go home OT Goal Formulation: With patient Time For Goal Achievement: 03/31/24 Potential to Achieve Goals: Fair ADL Goals Pt Will Perform Grooming: with modified independence Pt Will Perform Lower Body Dressing: with modified independence Pt Will Transfer to Toilet: with modified  independence Pt Will Perform Toileting - Clothing Manipulation and hygiene: with modified independence;sit to/from stand   OT Frequency:  Min 2X/week       AM-PAC OT "6 Clicks" Daily Activity     Outcome Measure Help from another person eating meals?: None Help from another person taking care of personal grooming?: A Little Help from another person toileting, which includes using toliet, bedpan, or urinal?: A Lot Help from another person bathing (including washing, rinsing, drying)?: A Lot Help from another person to put on and taking off regular upper body clothing?: A Lot Help from another person to put on and taking off regular lower body clothing?: A Lot 6 Click Score: 15   End of Session Nurse Communication: Mobility status  Activity Tolerance: Patient limited by pain Patient left: in bed;with call bell/phone within reach;with bed alarm set;Other (comment) (MD present)  OT Visit Diagnosis: Unsteadiness on feet (R26.81);Repeated falls (R29.6);Muscle weakness (generalized) (M62.81)                Time: 1000-1021 OT Time Calculation (min): 21 min Charges:  OT General Charges $OT Visit: 1 Visit OT Evaluation $OT Eval Low Complexity: 1 Low OT Treatments $Therapeutic Activity: 8-22 mins  George Kinder, MS, OTR/L , CBIS ascom [phone]  03/17/24, 4:15 PM

## 2024-03-18 DIAGNOSIS — R0609 Other forms of dyspnea: Secondary | ICD-10-CM | POA: Diagnosis present

## 2024-03-18 DIAGNOSIS — I252 Old myocardial infarction: Secondary | ICD-10-CM | POA: Diagnosis not present

## 2024-03-18 DIAGNOSIS — I110 Hypertensive heart disease with heart failure: Secondary | ICD-10-CM | POA: Diagnosis present

## 2024-03-18 DIAGNOSIS — Z96643 Presence of artificial hip joint, bilateral: Secondary | ICD-10-CM | POA: Diagnosis present

## 2024-03-18 DIAGNOSIS — I081 Rheumatic disorders of both mitral and tricuspid valves: Secondary | ICD-10-CM | POA: Diagnosis present

## 2024-03-18 DIAGNOSIS — J9811 Atelectasis: Secondary | ICD-10-CM | POA: Diagnosis present

## 2024-03-18 DIAGNOSIS — E43 Unspecified severe protein-calorie malnutrition: Secondary | ICD-10-CM

## 2024-03-18 DIAGNOSIS — F0390 Unspecified dementia without behavioral disturbance: Secondary | ICD-10-CM | POA: Diagnosis present

## 2024-03-18 DIAGNOSIS — I5033 Acute on chronic diastolic (congestive) heart failure: Secondary | ICD-10-CM | POA: Diagnosis present

## 2024-03-18 DIAGNOSIS — Z8249 Family history of ischemic heart disease and other diseases of the circulatory system: Secondary | ICD-10-CM | POA: Diagnosis not present

## 2024-03-18 DIAGNOSIS — Z888 Allergy status to other drugs, medicaments and biological substances status: Secondary | ICD-10-CM | POA: Diagnosis not present

## 2024-03-18 DIAGNOSIS — Z955 Presence of coronary angioplasty implant and graft: Secondary | ICD-10-CM | POA: Diagnosis not present

## 2024-03-18 DIAGNOSIS — Z85828 Personal history of other malignant neoplasm of skin: Secondary | ICD-10-CM | POA: Diagnosis not present

## 2024-03-18 DIAGNOSIS — Z681 Body mass index (BMI) 19 or less, adult: Secondary | ICD-10-CM | POA: Diagnosis not present

## 2024-03-18 DIAGNOSIS — Z87891 Personal history of nicotine dependence: Secondary | ICD-10-CM | POA: Diagnosis not present

## 2024-03-18 DIAGNOSIS — I5023 Acute on chronic systolic (congestive) heart failure: Secondary | ICD-10-CM | POA: Diagnosis not present

## 2024-03-18 DIAGNOSIS — D509 Iron deficiency anemia, unspecified: Secondary | ICD-10-CM | POA: Diagnosis present

## 2024-03-18 DIAGNOSIS — I25118 Atherosclerotic heart disease of native coronary artery with other forms of angina pectoris: Secondary | ICD-10-CM | POA: Diagnosis not present

## 2024-03-18 DIAGNOSIS — Z923 Personal history of irradiation: Secondary | ICD-10-CM | POA: Diagnosis not present

## 2024-03-18 DIAGNOSIS — I482 Chronic atrial fibrillation, unspecified: Secondary | ICD-10-CM | POA: Diagnosis present

## 2024-03-18 DIAGNOSIS — D692 Other nonthrombocytopenic purpura: Secondary | ICD-10-CM | POA: Diagnosis present

## 2024-03-18 DIAGNOSIS — Z7982 Long term (current) use of aspirin: Secondary | ICD-10-CM | POA: Diagnosis not present

## 2024-03-18 DIAGNOSIS — I472 Ventricular tachycardia, unspecified: Secondary | ICD-10-CM | POA: Diagnosis not present

## 2024-03-18 DIAGNOSIS — N179 Acute kidney failure, unspecified: Secondary | ICD-10-CM | POA: Diagnosis present

## 2024-03-18 DIAGNOSIS — Z515 Encounter for palliative care: Secondary | ICD-10-CM | POA: Diagnosis not present

## 2024-03-18 DIAGNOSIS — Z882 Allergy status to sulfonamides status: Secondary | ICD-10-CM | POA: Diagnosis not present

## 2024-03-18 DIAGNOSIS — Z66 Do not resuscitate: Secondary | ICD-10-CM | POA: Diagnosis not present

## 2024-03-18 DIAGNOSIS — R4189 Other symptoms and signs involving cognitive functions and awareness: Secondary | ICD-10-CM | POA: Diagnosis not present

## 2024-03-18 LAB — BPAM RBC
Blood Product Expiration Date: 202506022359
ISSUE DATE / TIME: 202505071354
Unit Type and Rh: 5100

## 2024-03-18 LAB — CBC WITH DIFFERENTIAL/PLATELET
Abs Immature Granulocytes: 0.02 10*3/uL (ref 0.00–0.07)
Basophils Absolute: 0 10*3/uL (ref 0.0–0.1)
Basophils Relative: 1 %
Eosinophils Absolute: 0.1 10*3/uL (ref 0.0–0.5)
Eosinophils Relative: 1 %
HCT: 27.4 % — ABNORMAL LOW (ref 36.0–46.0)
Hemoglobin: 8.3 g/dL — ABNORMAL LOW (ref 12.0–15.0)
Immature Granulocytes: 0 %
Lymphocytes Relative: 10 %
Lymphs Abs: 0.6 10*3/uL — ABNORMAL LOW (ref 0.7–4.0)
MCH: 27 pg (ref 26.0–34.0)
MCHC: 30.3 g/dL (ref 30.0–36.0)
MCV: 89.3 fL (ref 80.0–100.0)
Monocytes Absolute: 0.5 10*3/uL (ref 0.1–1.0)
Monocytes Relative: 8 %
Neutro Abs: 4.9 10*3/uL (ref 1.7–7.7)
Neutrophils Relative %: 80 %
Platelets: 264 10*3/uL (ref 150–400)
RBC: 3.07 MIL/uL — ABNORMAL LOW (ref 3.87–5.11)
RDW: 15.6 % — ABNORMAL HIGH (ref 11.5–15.5)
WBC: 6 10*3/uL (ref 4.0–10.5)
nRBC: 0.3 % — ABNORMAL HIGH (ref 0.0–0.2)

## 2024-03-18 LAB — TYPE AND SCREEN
ABO/RH(D): O POS
Antibody Screen: NEGATIVE
Unit division: 0

## 2024-03-18 LAB — BASIC METABOLIC PANEL WITH GFR
Anion gap: 9 (ref 5–15)
BUN: 25 mg/dL — ABNORMAL HIGH (ref 8–23)
CO2: 24 mmol/L (ref 22–32)
Calcium: 7.8 mg/dL — ABNORMAL LOW (ref 8.9–10.3)
Chloride: 104 mmol/L (ref 98–111)
Creatinine, Ser: 1.39 mg/dL — ABNORMAL HIGH (ref 0.44–1.00)
GFR, Estimated: 37 mL/min — ABNORMAL LOW (ref >60)
Glucose, Bld: 135 mg/dL — ABNORMAL HIGH (ref 70–99)
Potassium: 4 mmol/L (ref 3.5–5.1)
Sodium: 137 mmol/L (ref 135–145)

## 2024-03-18 MED ORDER — ENSURE ENLIVE PO LIQD
237.0000 mL | Freq: Three times a day (TID) | ORAL | Status: DC
Start: 2024-03-18 — End: 2024-04-02
  Administered 2024-03-18 – 2024-04-02 (×38): 237 mL via ORAL

## 2024-03-18 MED ORDER — ADULT MULTIVITAMIN W/MINERALS CH
1.0000 | ORAL_TABLET | Freq: Every day | ORAL | Status: DC
  Administered 2024-03-18 – 2024-04-02 (×16): 1 via ORAL
  Filled 2024-03-18 (×16): qty 1

## 2024-03-18 NOTE — Plan of Care (Signed)

## 2024-03-18 NOTE — Progress Notes (Signed)
 Physical Therapy Treatment Patient Details Name: [name] [name] MRN: [id number] DOB: 1935/08/31 Today's Date: 03/18/2024   History of Present Illness [name] [name] is a 89 y.o. female with medical history significant of HFrEF with last EF of 30-35% (2021), facial basal cell carcinoma s/p radiation (2025), atrial fibrillation, CAD s/p DES to mid LAD (2021), venous insufficiency, failure to thrive, who presents to the ED due to leg swelling.    PT Comments  Pt received upright in bed finishing lunch. Flat affect only answering yes and not to questions. Responses seem accurate and consistent per questions provided by author throughout session. Pt likely with waxing and waning cognition due to dementia with vastly worsening need for physical assist with bed mobility and max multi modal cuing. Upon sitting EOB pt with significant posterior lean reliant on author blocking feet and knees to prevent slipping out of bed needing max hand over hand positioning of each limb for fair static sitting. Pt is able to stand to RW with modA+1 with continuous posterior leaning but is able to maintain static standing once hands are on RW. Small shuffling steps taken to recliner with attempts made via VC's/facilitation of RW to redirect pt to ambulate in room but pt resistive going to recliner. Pt with safe hand placement upon descent. Pt needing maxA for doffing soiled gown and donning clean gown with all needs in reach. D/c recs remain appropriate.    If plan is discharge home, recommend the following: A lot of help with walking and/or transfers;A lot of help with bathing/dressing/bathroom   Can travel by private vehicle     No  Equipment Recommendations  None recommended by PT    Recommendations for Other Services       Precautions / Restrictions Precautions Precautions: Fall Recall of Precautions/Restrictions: Impaired Restrictions Weight Bearing Restrictions Per Provider Order: No     Mobility  Bed  Mobility Overal bed mobility: Needs Assistance Bed Mobility: Supine to Sit     Supine to sit: Max assist, HOB elevated     General bed mobility comments: max multimodal cuing for form/technique Patient Response: Cooperative, Flat affect  Transfers Overall transfer level: Needs assistance Equipment used: Rolling walker (2 wheels) Transfers: Sit to/from Stand, Bed to chair/wheelchair/BSC Sit to Stand: Mod assist   Step pivot transfers: Contact guard assist       General transfer comment: continues with posterior lean needing heavy person assist but able to demo upright posture once BUE's are on RW    Ambulation/Gait               General Gait Details: pt spontaneously ambulating to recliner. attempts made via redirection on RW and VC's to ambualte but pt not complying and resistive.   Stairs             Wheelchair Mobility     Tilt Bed Tilt Bed Patient Response: Cooperative, Flat affect  Modified Rankin (Stroke Patients Only)       Balance Overall balance assessment: Needs assistance Sitting-balance support: Bilateral upper extremity supported, Feet supported Sitting balance-Leahy Scale: Fair Sitting balance - Comments: heavy R lateral lean and posterior lean. Improves with hand over hand facilitation.   Standing balance support: Single extremity supported, During functional activity Standing balance-Leahy Scale: Fair Standing balance comment: utilizing RW                            Communication Communication Communication: Impaired Factors Affecting Communication: Hearing impaired  Cognition Arousal: Alert Behavior During Therapy: Flat affect   PT - Cognitive impairments: History of cognitive impairments, Awareness, Attention, Problem solving, Safety/Judgement                       PT - Cognition Comments: Only answers yes or no to questions Following commands: Intact      Cueing Cueing Techniques: Verbal cues   Exercises      General Comments        Pertinent Vitals/Pain Pain Assessment Pain Assessment: Faces Faces Pain Scale: Hurts little more Pain Location: back Pain Descriptors / Indicators: Grimacing, Discomfort Pain Intervention(s): Limited activity within patient's tolerance, Monitored during session, Repositioned    Home Living                          Prior Function            PT Goals (current goals can now be found in the care plan section) Acute Rehab PT Goals PT Goal Formulation: Patient unable to participate in goal setting Time For Goal Achievement: 03/31/24    Frequency    Min 1X/week      PT Plan      Co-evaluation              AM-PAC PT "6 Clicks" Mobility   Outcome Measure  Help needed turning from your back to your side while in a flat bed without using bedrails?: Total Help needed moving from lying on your back to sitting on the side of a flat bed without using bedrails?: Total Help needed moving to and from a bed to a chair (including a wheelchair)?: A Lot Help needed standing up from a chair using your arms (e.g., wheelchair or bedside chair)?: A Little Help needed to walk in hospital room?: A Little Help needed climbing 3-5 steps with a railing? : A Lot 6 Click Score: 12    End of Session Equipment Utilized During Treatment: Gait belt Activity Tolerance: Patient tolerated treatment well Patient left: in chair;with call bell/phone within reach;with chair alarm set Nurse Communication: Mobility status PT Visit Diagnosis: Unsteadiness on feet (R26.81);Other abnormalities of gait and mobility (R26.89);Muscle weakness (generalized) (M62.81);Difficulty in walking, not elsewhere classified (R26.2);History of falling (Z91.81)     Time: 1610-9604 PT Time Calculation (min) (ACUTE ONLY): 16 min  Charges:    $Therapeutic Activity: 8-22 mins PT General Charges $$ ACUTE PT VISIT: 1 Visit                     Marc Senior. Fairly IV,  PT, DPT Physical Therapist- [place]  [facility]  03/18/2024, 2:48 PM

## 2024-03-18 NOTE — Consult Note (Addendum)
 WOC Nurse Consult Note: Reason for Consult: Requested to change Unna boot. Wound type: Bilateral venous ulcers. Pressure Injury POA: NA Measurement: Left leg - 3 ulcers aprox. 1 cm x 1 cm, 100% dark red. Top of the upper leg. 1 ulcer on the malleolar posterior, 100% dark red. Drainage (amount, consistency, odor) Scant amout. No odor. Periwound: intact. Dressing procedure/placement/frequency: Cleanse with warm water and gauze. Apply Mepitel on the wounds. Wrap the leg with Unna boot and Coban. Change every 7 days.  Measurement: Right leg - 3 ulcers 50% pink granulation, 50% yellow, aprox. 1 cm x 1 cm. (2 Below the knee, 1 midline tibial) Malleolar anterior 1.5 cm x 1.5 cm 100% dark red. Drainage (amount, consistency, odor) Scant amout. No odor. Periwound: intact. Dressing procedure/placement/frequency: Cleanse with warm water and gauze. Apply Xeroform on the wounds. Wrap the leg with Unna boot and Coban. Change every 7 days.  WOC team will follow.  Please reconsult if further assistance is needed. Thank-you,  Rachel Budds BSN, RN, [employer], WOC  (Pager: [phone])

## 2024-03-18 NOTE — Progress Notes (Signed)
 Progress Note   Patient: [name] [name]:[id number] DOB: 05/26/1935 DOA: 03/16/2024     2 DOS: the patient was seen and examined on 03/18/2024    Brief hospital course: From HPI "[name] [name] is a 89 y.o. female with medical history significant of HFrEF with last EF of 30-35% (2021), facial basal cell carcinoma s/p radiation (2025), atrial fibrillation, CAD s/p DES to mid LAD (2021), venous insufficiency, failure to thrive, who presents to the ED due to leg swelling. Patient saw her PCP with her sister on 03/02/2024, at which time her sister noted that patient had been declining, becoming more forgetful, and family was worried she is no longer safe to be living independently.   ED course: On arrival to the ED, patient was hypertensive at 145/90 with heart rate of 115.  She was saturating at 100% on room air.  She was afebrile 97.9.  Initial workup notable for hemoglobin of 7.8, bicarb 21, BUN 25, creatinine 1.44, GFR 35.  BNP 919.  Troponin 24 and then 22.  Chest x-ray with mild bibasilar atelectasis with associated pleural effusions.  Patient started on IV Lasix  and TRH contacted for admission.  "   Assessment and Plan:   Acute on chronic HFrEF (heart failure with reduced ejection fraction) (HCC) Per chart review, patient has a history of HFrEF with EF of 30% when admitted in 2021 for NSTEMI.   Continue telemetry Continue IV Lasix  Echo shows EF 60 to 65% with indeterminate diastolic parameters Palliative care consulted for goals of care discussion   AKI (acute kidney injury) (HCC) Elevated creatinine at 1.44 with previous elevations also noted over the past few months, although there was concern this is due to lack of access to food.  Given hypervolemia, concerned this may be due to progressive cardiac disease. Follow-up on bladder ultrasound We will monitor renal function closely given diuresis   Cognitive decline TOC on board Palliative care on board Continue delirium precaution    Normocytic anemia Chronic anemia with baseline hemoglobin between 9 and 10 however presented with hemoglobin 7.8 - Transfuse for hemoglobin less than 7 Follow-up vitamin B12, iron  panel, ferritin   Protein-calorie malnutrition, severe (HCC) Severe protein calorie malnutrition with BMI of 17.   Concerned this may be due to lack of access to food.   - Dietitian consulted   Coronary artery disease of native artery of native heart with stable angina pectoris (HCC) History of CAD s/p PCI with DES to mid LAD in 2021.  Patient denies any chest pain at this time. Continue home aspirin    Atrial fibrillation, chronic (HCC) Patient is no longer on anticoagulation.   - Continue home metoprolol    Hypertension - Hold home antihypertensives given AKI   Advance Care Planning:   Code Status: Full Code as patient is unable to make medical decisions and no surrogate at bedside   Consults: None   Family Communication: No family at bedside     Subjective:    Patient seen and examined at bedside this morning Patient admits to improvement in respiratory function Denies nausea vomiting chest pain or abdominal pain PT OT have recommended rehab at discharge   Physical Exam: Vitals and nursing note reviewed.  Constitutional:      General: She is not in acute distress.    Appearance: She is not toxic-appearing.  HENT:     Head: Normocephalic and atraumatic.  Eyes:     Conjunctiva/sclera: Conjunctivae normal.     Pupils: Pupils are equal, round, and  reactive to light.  Cardiovascular: Heart sounds 1 and 2 present irregular rhythm Pulmonary: Decreased air entry bibasilarly Abdominal:    Tenderness: There is no abdominal tenderness.  Skin:    General: Skin is warm and dry.     Comments: Senile purpura  Neurological:     Mental Status: She is alert.  However only oriented to place and person but disoriented to time Psychiatric:        Mood and Affect: Mood and affect normal.        Family  Communication: Spoke with patient's sister over the phone   Disposition: Status is: Inpatient   Time spent: 50 minutes    Data Reviewed:     Latest Ref Rng & Units 03/18/2024    4:01 AM 03/17/2024    4:56 AM 03/16/2024   10:42 AM  CBC  WBC 4.0 - 10.5 K/uL 6.0  7.5  6.6   Hemoglobin 12.0 - 15.0 g/dL 8.3  6.9  7.8   Hematocrit 36.0 - 46.0 % 27.4  23.4  26.9   Platelets 150 - 400 K/uL 264  261  286        Latest Ref Rng & Units 03/18/2024    4:01 AM 03/17/2024    4:56 AM 03/16/2024   10:42 AM  BMP  Glucose 70 - 99 mg/dL 960  454  098   BUN 8 - 23 mg/dL 25  28  25    Creatinine 0.44 - 1.00 mg/dL 1.19  1.47  8.29   Sodium 135 - 145 mmol/L 137  139  136   Potassium 3.5 - 5.1 mmol/L 4.0  4.6  4.5   Chloride 98 - 111 mmol/L 104  106  106   CO2 22 - 32 mmol/L 24  26  21    Calcium  8.9 - 10.3 mg/dL 7.8  8.2  8.1       Vitals:   03/18/24 0320 03/18/24 0323 03/18/24 0758 03/18/24 1205  BP: (!) 142/70  (!) 144/68 135/80  Pulse: 73  72 (!) 47  Resp: 17  18 18   Temp: 97.7 F (36.5 C)  98.1 F (36.7 C) (!) 97.5 F (36.4 C)  TempSrc: Oral Oral    SpO2: 99%  97% (!) 89%  Weight:  49.8 kg    Height:  5\' 6"  (1.676 m)      Author: Ezzard Holms, MD 03/18/2024 5:11 PM  For on call review [email or web address].

## 2024-03-18 NOTE — TOC Initial Note (Addendum)
 Transition of Care [facility]) - Initial/Assessment Note    Patient Details  Name: [name] [name] MRN: [id number] Date of Birth: 08-26-1935  [facility]) CM/SW Contact:    Odilia Bennett, LCSW Phone Number: 03/18/2024, 12:59 PM  Clinical Narrative:  Patient not fully oriented. No family at bedside. CSW called sister, introduced role, and explained that therapy recommendations would be discussed. Sister confirmed interest in rehab and then transition to LTC. Reviewed bed offers. [facility] has not responded yet but CSW left the admissions coordinator a message asking her to review. Discussed payor source for LTC. Sister said she would be able to pay privately for about a month and then she would qualify for Medicaid. Patient was receiving hospice services through [facility] prior to admission. CSW notified sister that she would have to revoke services if she is going to rehab. Sister expressed understanding. CSW updated [facility], [facility]. No further concerns. CSW will continue to follow progress and facilitate discharge to SNF once medically stable.            4:26 pm: [facility] is unable to offer. [facility] and [facility] confirmed they can still offer her a bed. [facility] is reviewing referral. If [facility] offers, sister stated they are first preference because she has been there in the past. If not, [facility] would be next preference.  Expected Discharge Plan: Skilled Nursing Facility Barriers to Discharge: Continued Medical Work up   Patient Goals and CMS Choice            Expected Discharge Plan and Services     Post Acute Care Choice: Skilled Nursing Facility Living arrangements for the past 2 months: Single Family Home                                      Prior Living Arrangements/Services Living arrangements for the past 2 months: Single Family Home Lives with:: Self Patient language and need for interpreter  reviewed:: Yes Do you feel safe going back to the place where you live?: No   Per sister, unable to live alone any longer.  Need for Family Participation in Patient Care: Yes (Comment)   Current home services: Hospice Criminal Activity/Legal Involvement Pertinent to Current Situation/Hospitalization: No - Comment as needed  Activities of Daily Living   ADL Screening (condition at time of admission) Independently performs ADLs?: No Does the patient have a NEW difficulty with bathing/dressing/toileting/self-feeding that is expected to last >3 days?: Yes (Initiates electronic notice to provider for possible OT consult) Does the patient have a NEW difficulty with getting in/out of bed, walking, or climbing stairs that is expected to last >3 days?: No Does the patient have a NEW difficulty with communication that is expected to last >3 days?: No Is the patient deaf or have difficulty hearing?: No Does the patient have difficulty seeing, even when wearing glasses/contacts?: No Does the patient have difficulty concentrating, remembering, or making decisions?: No  Permission Sought/Granted Permission sought to share information with : Facility Medical sales representative, Family Supports    Share Information with NAME: Mar yMarshall  Permission granted to share info w AGENCY: SNF's  Permission granted to share info w Relationship: Sister  Permission granted to share info w Contact Information: [phone]  Emotional Assessment Appearance:: Appears stated age Attitude/Demeanor/Rapport: Unable to Assess Affect (typically observed): Unable to Assess Orientation: : Oriented to Self,  Oriented to Place Alcohol / Substance Use: Not Applicable Psych Involvement: No (comment)  Admission diagnosis:  Exertional dyspnea [R06.09] Acute on chronic HFrEF (heart failure with reduced ejection fraction) (HCC) [I50.23] Patient Active Problem List   Diagnosis Date Noted   Acute on chronic HFrEF (heart failure  with reduced ejection fraction) (HCC) 03/16/2024   Cognitive decline 03/16/2024   Normocytic anemia 03/16/2024   Failure to thrive in adult 03/05/2024   Autonomic neuropathy in diseases classified elsewhere 10/11/2023   Open wound of left lower leg 03/28/2023   Open wound of lower leg, right, subsequent encounter 03/28/2023   PVD (peripheral vascular disease) (HCC) 02/25/2023   Angiodysplasia of stomach 02/24/2023   Acute blood loss anemia 02/21/2023   Toe necrosis (HCC) 02/21/2023   Cellulitis 02/21/2023   Cellulitis of multiple sites of lower extremity 02/20/2023   AKI (acute kidney injury) (HCC) 02/20/2023   Venous insufficiency (chronic) (peripheral) 02/04/2023   Chronic systolic (congestive) heart failure (HCC) 01/12/2023   Coronary artery disease of native artery of native heart with stable angina pectoris (HCC) 01/05/2023   Atrial fibrillation, chronic (HCC) 08/16/2021   Protein-calorie malnutrition, severe (HCC) 06/13/2020   Hyponatremia 06/11/2020   Tobacco abuse 06/11/2020   Chronic low back pain (1ry area of Pain) (Bilateral) (R>L) w/ sciatica (Bilateral) 01/12/2020   Chronic lower extremity pain (2ry area of Pain) (Bilateral) (R>L) 01/12/2020   DDD (degenerative disc disease), lumbosacral 01/12/2020   Walker as ambulation aid 01/12/2020   Chronic pain syndrome 01/12/2020   Pain in joint, pelvic region and thigh 01/05/2020   Idiopathic scoliosis and kyphoscoliosis 12/03/2019   Hypertension 08/02/2019   Vitamin D  deficiency 08/02/2019   Peripheral autonomic neuropathy 08/02/2019   Hyperlipidemia 08/02/2019   PCP:  Trenda Frisk, FNP Pharmacy:   [facility] [phone] [place], [place] - [address] [place] [address] Phone: [phone] Fax: [phone]     Social Drivers of Health (SDOH) Social History: SDOH Screenings   Food Insecurity: No Food Insecurity (03/17/2024)  Housing: Low Risk  (03/17/2024)  Transportation Needs: No  Transportation Needs (03/17/2024)  Utilities: Not At Risk (03/17/2024)  Depression (PHQ2-9): Low Risk  (10/29/2023)  Physical Activity: Insufficiently Active (12/25/2022)  Social Connections: Socially Isolated (03/17/2024)  Stress: Stress Concern Present (12/25/2022)  Tobacco Use: Medium Risk (03/16/2024)   SDOH Interventions:     Readmission Risk Interventions     No data to display

## 2024-03-18 NOTE — Progress Notes (Signed)
 Mobility Specialist - Progress Note   03/18/24 1000  Mobility  Activity Ambulated with assistance in room;Stood at bedside;Transferred from bed to chair;Dangled on edge of bed  Level of Assistance Minimal assist, patient does 75% or more  Assistive Device Front wheel walker  Distance Ambulated (ft) 12 ft  Activity Response Tolerated well  Mobility visit 1 Mobility     Pt lying in bed upon arrival, utilizing RA. Pt AO to self. Agreeable to activity. Participated in supine therex prior to OOB. Clean linen and gown provided during hygiene task. Completed bed mobility with modI + extra time. STS with CGA. Ambulation in room with minA. RW negotiation to avoid obstacles. Follows commands intermittently. Pt left in chair with alarm set, needs in reach. RN notified.    Searcy Czech Mobility Specialist 03/18/24, 10:15 AM

## 2024-03-18 NOTE — Plan of Care (Signed)

## 2024-03-18 NOTE — Progress Notes (Signed)
 Pt yelled out stating that she had to void. She was unable to bear weight with 2 assist to [facility]. Assisted back to bed and bedpan was placed.

## 2024-03-18 NOTE — Progress Notes (Signed)
 Palliative Care Progress Note, Assessment & Plan   Patient Name: [name] [name]       Date: 03/18/2024 DOB: Jun 11, 1935  Age: 89 y.o. MRN#: [id number] Attending Physician: [name] Holms, MD Primary Care Physician: [name] Frisk, FNP Admit Date: 03/16/2024  Subjective: Patient is sitting up in bed, awake and alert.  She is oriented to self only.  When asked if she has any needs at the moment, she replied, "like me up".  Patient unable to participate in goals of care medical decision making independently at this time given her dementia.  No family or friends present at bedside during my visit.  HPI: 89 y.o. female  with past medical history of HFrEF with last EF of 30-35% (2021), facial basal cell carcinoma s/p radiation (2025), atrial fibrillation, CAD s/p DES to mid LAD (2021), venous insufficiency, failure to thrive  admitted on 03/16/2024 with leg swelling.   Of note, patient visited her PCP with her [name] [name] on 03/02/2024, at which time [name] [name] noted that patient has been declining, becoming more forgetful, and family was concerned she was no longer safe to be living independently.   Patient is being treated for acute on chronic HFrEF (EF of 30%), AKI, cognitive decline, malnutrition, chronic A-fib, and hypertension.   Summary of counseling/coordination of care: Extensive chart review completed prior to meeting patient including labs, vital signs, imaging, progress notes, orders, and available advanced directive documents from current and previous encounters.   After reviewing the patient's chart and assessing the patient at bedside, I spoke with patient in regards to symptom management and goals of care.   I attempted to have patient participate in symptom assessment.  She shares "I cannot".  I  shared that she can simply answer yes or no to questions.  She again stated that she could not.  Patient declined to speak with me further.  After visiting with the patient, I spoke with her sister/HCPOA surrogate decision maker [name] [name] over the phone.  Brief medical update given.  Discussed next steps and plan of care.  [name] [name] shares that she remains in agreement with DNR with limited interventions.  She is grateful that patient will be going to a skilled nursing facility in hopes of having that convert into a long-term care situation.  In light of patient transferring to an outside medical facility, introduced the concept of a MOST form.  Discussed medical refer scope of treatment with [name] [name] in detail.  She shares she would like to review the paperwork.  I advised her that any provider (MD/PA/DO/NP) can complete it with her, either in this [name] or in another medical facility.  She was grateful for the discussion of additional layers of advanced care planning to make patient's wishes known.  Also advised her that a paper copy has been at left at bedside for her to review.  She requested electronic copy.  Electronic copy to be sent to email provided ([name]).  Goals are clear.  Symptom burden is low.  [name] [name] has PMT contact info and was advised to reach out with any acute palliative issues or concerns.  PMT will step back from daily visits and monitor the patient peripherally.  Please reach  out to reengage with PMT if goals change, at patient/family's request, or patient's health deteriorates during hospitalization.  Physical Exam Vitals reviewed.  Constitutional:      General: She is not in acute distress.    Comments: Thin, frail  HENT:     Mouth/Throat:     Mouth: Mucous membranes are moist.  Eyes:     Pupils: Pupils are equal, round, and reactive to light.  Pulmonary:     Effort: Pulmonary effort is normal.  Abdominal:     Palpations: Abdomen is soft.  Musculoskeletal:      Comments: Generalized weakness, MAETC  Skin:    General: Skin is warm and dry.  Neurological:     Mental Status: She is alert.     Comments: Oriented to self  Psychiatric:        Behavior: Behavior normal.        Judgment: Judgment normal.             Total Time 50 minutes   Time spent includes: Detailed review of medical records (labs, imaging, vital signs), medically appropriate exam (mental status, respiratory, cardiac, skin), discussed with treatment team, counseling and educating patient, family and staff, documenting clinical information, medication management and coordination of care.  [name] Nose L. Rebbeca Campi, DNP, FNP-BC Palliative Medicine Team

## 2024-03-18 NOTE — Progress Notes (Signed)
 Initial Nutrition Assessment  DOCUMENTATION CODES:   Underweight, Severe malnutrition in context of chronic illness  INTERVENTION:   -Continue with regular diet for widest variety of meal selections -Ensure Enlive po TID, each supplement provides 350 kcal and 20 grams of protein.  -Magic cup TID with meals, each supplement provides 290 kcal and 9 grams of protein  -MVI with minerals daily -Feeding assistance with meals  NUTRITION DIAGNOSIS:   Severe Malnutrition related to chronic illness (CHF) as evidenced by moderate fat depletion, severe fat depletion, moderate muscle depletion, severe muscle depletion.  GOAL:   Patient will meet greater than or equal to 90% of their needs  MONITOR:   PO intake, Supplement acceptance  REASON FOR ASSESSMENT:   Consult Assessment of nutrition requirement/status  ASSESSMENT:   Pt with medical history significant of HFrEF with last EF of 30-35% (2021), facial basal cell carcinoma s/p radiation (2025), atrial fibrillation, CAD s/p DES to mid LAD (2021), venous insufficiency, failure to thrive, who presents due to leg swelling  Pt admitted with CHF and AKI.   Reviewed I/O's: -190 ml x 24 hours  UOP: 550 ml x 24 hours  Per CWOCN notes, pt with bilateral venous stasis ulcers on legs. Unna boot was changed today by [employer].   Spoke with pt at bedside, who reports feeling well today. Pt minimally interactive with this RD and mostly answered close ended questions. TPt confirmed that she lives alone, but her sister and brother and law help with her care. She did not have a good appetite PTA per her report, but was unable to elaborate further despite questioning. Pt denies any chewing or swallowing difficulties; RD observed pt consume a sausage patty without difficulty, however, needed assistance opening up packages on her meal tray.   Pt unsure of UBW or if she has lost weight. Noted mild wt gain over the past 6 months, however, pt with edema, which  may be masking true weight loss as well as fat and muscle depletions.  Discussed importance of good meal and supplement intake to promote healing.   Palliative care following for goals of care discussions.   Medications reviewed and include ferrous sulfate, lasix , and neurontin .  Per TOC notes, plan for SNF placement.   Labs reviewed.    NUTRITION - FOCUSED PHYSICAL EXAM:  Flowsheet Row Most Recent Value  Orbital Region Moderate depletion  Upper Arm Region Severe depletion  Thoracic and Lumbar Region Severe depletion  Buccal Region Severe depletion  Temple Region Moderate depletion  Clavicle Bone Region Severe depletion  Clavicle and Acromion Bone Region Severe depletion  Scapular Bone Region Severe depletion  Dorsal Hand Severe depletion  Patellar Region Moderate depletion  Anterior Thigh Region Moderate depletion  Posterior Calf Region Moderate depletion  Edema (RD Assessment) Moderate  Hair Reviewed  Eyes Reviewed  Mouth Reviewed  Skin Reviewed  Nails Reviewed       Diet Order:   Diet Order             Diet regular Room service appropriate? Yes; Fluid consistency: Thin  Diet effective now                   EDUCATION NEEDS:   Education needs have been addressed  Skin:  Skin Assessment: Reviewed RN Assessment  Last BM:  03/16/24  Height:   Ht Readings from Last 1 Encounters:  03/18/24 5\' 6"  (1.676 m)    Weight:   Wt Readings from Last 1 Encounters:  03/18/24 49.8  kg    Ideal Body Weight:  59.1 kg  BMI:  Body mass index is 17.72 kg/m.  Estimated Nutritional Needs:   Kcal:  1750-1950  Protein:  85-100 grams  Fluid:  1.7-1.9 L    Herschel Lords, RD, LDN, CDCES Registered Dietitian III Certified Diabetes Care and Education Specialist If unable to reach this RD, please use "RD Inpatient" group chat on secure chat between hours of 8am-4 pm daily

## 2024-03-19 DIAGNOSIS — I5023 Acute on chronic systolic (congestive) heart failure: Secondary | ICD-10-CM

## 2024-03-19 LAB — BASIC METABOLIC PANEL WITH GFR
Anion gap: 6 (ref 5–15)
BUN: 30 mg/dL — ABNORMAL HIGH (ref 8–23)
CO2: 26 mmol/L (ref 22–32)
Calcium: 8.1 mg/dL — ABNORMAL LOW (ref 8.9–10.3)
Chloride: 103 mmol/L (ref 98–111)
Creatinine, Ser: 1.25 mg/dL — ABNORMAL HIGH (ref 0.44–1.00)
GFR, Estimated: 42 mL/min — ABNORMAL LOW (ref >60)
Glucose, Bld: 107 mg/dL — ABNORMAL HIGH (ref 70–99)
Potassium: 4.4 mmol/L (ref 3.5–5.1)
Sodium: 135 mmol/L (ref 135–145)

## 2024-03-19 LAB — CBC WITH DIFFERENTIAL/PLATELET
Abs Immature Granulocytes: 0.03 10*3/uL (ref 0.00–0.07)
Basophils Absolute: 0 10*3/uL (ref 0.0–0.1)
Basophils Relative: 0 %
Eosinophils Absolute: 0 10*3/uL (ref 0.0–0.5)
Eosinophils Relative: 1 %
HCT: 27.6 % — ABNORMAL LOW (ref 36.0–46.0)
Hemoglobin: 8.5 g/dL — ABNORMAL LOW (ref 12.0–15.0)
Immature Granulocytes: 0 %
Lymphocytes Relative: 8 %
Lymphs Abs: 0.7 10*3/uL (ref 0.7–4.0)
MCH: 27.2 pg (ref 26.0–34.0)
MCHC: 30.8 g/dL (ref 30.0–36.0)
MCV: 88.5 fL (ref 80.0–100.0)
Monocytes Absolute: 0.8 10*3/uL (ref 0.1–1.0)
Monocytes Relative: 11 %
Neutro Abs: 6.4 10*3/uL (ref 1.7–7.7)
Neutrophils Relative %: 80 %
Platelets: 278 10*3/uL (ref 150–400)
RBC: 3.12 MIL/uL — ABNORMAL LOW (ref 3.87–5.11)
RDW: 15.7 % — ABNORMAL HIGH (ref 11.5–15.5)
WBC: 8 10*3/uL (ref 4.0–10.5)
nRBC: 0.4 % — ABNORMAL HIGH (ref 0.0–0.2)

## 2024-03-19 NOTE — Plan of Care (Signed)
  Problem: Clinical Measurements: Goal: Ability to maintain clinical measurements within normal limits will improve Outcome: Progressing   Problem: Education: Goal: Knowledge of General Education information will improve Description: Including pain rating scale, medication(s)/side effects and non-pharmacologic comfort measures Outcome: Progressing   Problem: Clinical Measurements: Goal: Will remain free from infection Outcome: Progressing   Problem: Clinical Measurements: Goal: Diagnostic test results will improve Outcome: Progressing   Problem: Clinical Measurements: Problem: Safety: Goal: Ability to remain free from injury will improve Outcome: Progressing   Problem: Skin Integrity: Goal: Risk for impaired skin integrity will decrease Outcome: Progressing   Goal: Respiratory complications will improve Outcome: Progressing   Problem: Activity: Goal: Risk for activity intolerance will decrease Outcome: Progressing    Plan of care ongoing, see MAR see flowsheet

## 2024-03-19 NOTE — Plan of Care (Signed)
  Problem: Education: Goal: Knowledge of General Education information will improve Description: Including pain rating scale, medication(s)/side effects and non-pharmacologic comfort measures 03/19/2024 1615 by Townsend Freud, RN Outcome: Progressing 03/19/2024 1615 by Townsend Freud, RN Outcome: Progressing   Problem: Health Behavior/Discharge Planning: Goal: Ability to manage health-related needs will improve 03/19/2024 1615 by Townsend Freud, RN Outcome: Progressing 03/19/2024 1615 by Townsend Freud, RN Outcome: Progressing   Problem: Clinical Measurements: Goal: Ability to maintain clinical measurements within normal limits will improve 03/19/2024 1615 by Townsend Freud, RN Outcome: Progressing 03/19/2024 1615 by Townsend Freud, RN Outcome: Progressing Goal: Will remain free from infection 03/19/2024 1615 by Townsend Freud, RN Outcome: Progressing 03/19/2024 1615 by Townsend Freud, RN Outcome: Progressing Goal: Diagnostic test results will improve 03/19/2024 1615 by Townsend Freud, RN Outcome: Progressing 03/19/2024 1615 by Townsend Freud, RN Outcome: Progressing Goal: Respiratory complications will improve 03/19/2024 1615 by Townsend Freud, RN Outcome: Progressing 03/19/2024 1615 by Townsend Freud, RN Outcome: Progressing Goal: Cardiovascular complication will be avoided 03/19/2024 1615 by Townsend Freud, RN Outcome: Progressing 03/19/2024 1615 by Townsend Freud, RN Outcome: Progressing

## 2024-03-19 NOTE — Progress Notes (Signed)
 Physical Therapy Treatment Patient Details Name: [name] [name] MRN: [id number] DOB: 09/12/1935 Today's Date: 03/19/2024   History of Present Illness [name] [name] is a 89 y.o. female with medical history significant of HFrEF with last EF of 30-35% (2021), facial basal cell carcinoma s/p radiation (2025), atrial fibrillation, CAD s/p DES to mid LAD (2021), venous insufficiency, failure to thrive, who presents to the ED due to leg swelling.    PT Comments  Pt received in bed for co-tx with OT due to pt's unpredictable balance and safety awareness resulting in high fall risk at times. Increased time to stay on task, however pt was able to transition self to the EOB with use of rails and HOB raised. Sit<>stand transfers vary between Min/ModA depending on surface and pt's cognitive clarity/anxiety. Overall, she participated well, progressed gait training with RW into hallway with cg/MinA of 1 for safety and additional assist for O2 and chair if needed. Pt's SpO2 on 2L remained in upper 90's, when removed at rest, SpO2 declined to 90% within one minute.Continue PT per POC, awaiting STR.   If plan is discharge home, recommend the following: A lot of help with walking and/or transfers;A lot of help with bathing/dressing/bathroom   Can travel by private vehicle     Yes  Equipment Recommendations  None recommended by PT (TBD at next level of care)    Recommendations for Other Services       Precautions / Restrictions Precautions Precautions: Fall Recall of Precautions/Restrictions: Impaired Restrictions Weight Bearing Restrictions Per Provider Order: No     Mobility  Bed Mobility Overal bed mobility: Needs Assistance Bed Mobility: Supine to Sit     Supine to sit: Supervision, HOB elevated, Used rails     General bed mobility comments: max multimodal cuing for form/technique and to stay on task    Transfers Overall transfer level: Needs assistance Equipment used: Rolling walker (2  wheels) Transfers: Sit to/from Stand, Bed to chair/wheelchair/BSC Sit to Stand: Mod assist           General transfer comment: Amount of assistance varies between Min-ModA depending on pt's focus level and desired task    Ambulation/Gait Ambulation/Gait assistance: Contact guard assist, Min assist Gait Distance (Feet): 35 Feet Assistive device: Rolling walker (2 wheels) Gait Pattern/deviations: Step-to pattern, Decreased step length - right, Decreased step length - left, Shuffle Gait velocity: decr     General Gait Details: Very close CGA for safety since pt can be unpredicatable during mobility. MinA to maintain standing balance when wheel of RW collided with the bed. Assist of 2 to safely manage pt during mobility, O2, and chair as needed   Stairs             Wheelchair Mobility     Tilt Bed    Modified Rankin (Stroke Patients Only)       Balance Overall balance assessment: Needs assistance Sitting-balance support: Feet supported Sitting balance-Leahy Scale: Fair     Standing balance support: Single extremity supported, During functional activity Standing balance-Leahy Scale: Fair Standing balance comment: Less of a fall risk with use of B UE support on RW while mobilizing                            Communication Communication Communication: Impaired Factors Affecting Communication: Hearing impaired  Cognition Arousal: Alert Behavior During Therapy: Anxious   PT - Cognitive impairments: History of cognitive impairments, Awareness, Attention, Problem solving, Safety/Judgement  Orientation impairments: Situation, Time, Place                   PT - Cognition Comments: Very confused at times, more in pm hours, able to be redirected Following commands: Impaired Following commands impaired: Follows one step commands inconsistently    Cueing Cueing Techniques: Verbal cues  Exercises      General Comments General comments (skin  integrity, edema, etc.): Pt educated on role of PT, current plan to transition to STR upon d/c.      Pertinent Vitals/Pain Pain Assessment Pain Assessment: No/denies pain    Home Living                          Prior Function            PT Goals (current goals can now be found in the care plan section) Acute Rehab PT Goals Patient Stated Goal: go home    Frequency    Min 1X/week      PT Plan      Co-evaluation PT/OT/SLP Co-Evaluation/Treatment: Yes Reason for Co-Treatment: For patient/therapist safety PT goals addressed during session: Mobility/safety with mobility;Balance OT goals addressed during session: ADL's and self-care      AM-PAC PT "6 Clicks" Mobility   Outcome Measure  Help needed turning from your back to your side while in a flat bed without using bedrails?: A Little Help needed moving from lying on your back to sitting on the side of a flat bed without using bedrails?: A Little Help needed moving to and from a bed to a chair (including a wheelchair)?: A Little Help needed standing up from a chair using your arms (e.g., wheelchair or bedside chair)?: A Little Help needed to walk in hospital room?: A Little Help needed climbing 3-5 steps with a railing? : A Lot 6 Click Score: 17    End of Session Equipment Utilized During Treatment: Gait belt;Oxygen Activity Tolerance: Patient tolerated treatment well Patient left: in chair;with call bell/phone within reach;with chair alarm set Nurse Communication: Mobility status PT Visit Diagnosis: Unsteadiness on feet (R26.81);Other abnormalities of gait and mobility (R26.89);Muscle weakness (generalized) (M62.81);Difficulty in walking, not elsewhere classified (R26.2);History of falling (Z91.81)     Time: 9604-5409 PT Time Calculation (min) (ACUTE ONLY): 27 min  Charges:    $Therapeutic Activity: 8-22 mins PT General Charges $$ ACUTE PT VISIT: 1 Visit                    Melvyn Stagers,  PTA  Diona Franklin 03/19/2024, 3:25 PM

## 2024-03-19 NOTE — Progress Notes (Signed)
 Occupational Therapy Treatment Patient Details Name: [name] [name] MRN: [id number] DOB: [birth year] Today's Date: 03/19/2024   History of present illness [name] [name] is a 89 y.o. female with medical history significant of HFrEF with last EF of 30-35% (2021), facial basal cell carcinoma s/p radiation (2025), atrial fibrillation, CAD s/p DES to mid LAD (2021), venous insufficiency, failure to thrive, who presents to the ED due to leg swelling.   OT comments  Pt is supine in bed on arrival. Easily arousable and agreeable to PT/OT co-tx session. No pain reported during session. Pt performed bed mobility with increased time, redirection but no physical assist this date-SUP. Able to sit at EOB with supervision and preparing for bed to chair transfer when she stated she needed to use the bathroom. Min A x1-2 for STS from EOB and SPT to [facility]. Min A x1 for STS from [facility] to RW with ability to maintain static standing balance at RW with CGA while OT provided max A for peri-care. Max A for LB dressing to change out mesh underwear from soiled to clean pair. Pt perseverating on walking out into hallway to see what's out there. OT providing SBA for safety and management of oxygen tank while PT providing assist for mobility right outside of pt door and back to recliner where she was left seated with alarm in place and needs in reach. Pt believed she was at home at times during session stating "I want to walk out to my den." Asked for all doors to be left open so she doesn't feel "Closed in". Sp02 stable on 2L throughout. She will cont to require skilled acute OT services to maximize her safety and IND to return to PLOF.       If plan is discharge home, recommend the following:  A lot of help with bathing/dressing/bathroom;Assistance with cooking/housework;Assist for transportation;Help with stairs or ramp for entrance;Supervision due to cognitive status;Direct supervision/assist for financial management;Direct  supervision/assist for medications management;A little help with walking and/or transfers   Equipment Recommendations  Other (comment) (defer to next venue)    Recommendations for Other Services      Precautions / Restrictions Precautions Precautions: Fall Recall of Precautions/Restrictions: Impaired Restrictions Weight Bearing Restrictions Per Provider Order: No       Mobility Bed Mobility Overal bed mobility: Needs Assistance Bed Mobility: Supine to Sit     Supine to sit: Supervision, HOB elevated, Used rails     General bed mobility comments: cueing for redirection d/t easily distracated, anxious and perseverating on wanting doors open    Transfers Overall transfer level: Needs assistance Equipment used: Rolling walker (2 wheels) Transfers: Sit to/from Stand, Bed to chair/wheelchair/BSC Sit to Stand: Min assist, Mod assist     Step pivot transfers: Min assist, +2 physical assistance     General transfer comment: Min A x2 (Mod A x1) provided for STS from EOB to RW and for taking steps to turn and sit on BSC; able to stand from [facility] with Min/Mod A x1 and maintain balance at RW with CGA for peri-care; amb into hallway and back with Min/MOD A x1 d/t cognition and SBA x1 for oxygen tank management     Balance Overall balance assessment: Needs assistance Sitting-balance support: Feet supported Sitting balance-Leahy Scale: Fair     Standing balance support: During functional activity, Bilateral upper extremity supported Standing balance-Leahy Scale: Fair Standing balance comment: RW use and stedy with BUE support; CGA x1 to maintain static standing balance during peri-care  ADL either performed or assessed with clinical judgement   ADL Overall ADL's : Needs assistance/impaired                     Lower Body Dressing: Maximal assistance;Sit to/from stand Lower Body Dressing Details (indicate cue type and reason): to  doff/don mesh underwear Toilet Transfer: Minimal assistance;+2 for safety/equipment;+2 for physical assistance;BSC/3in1;Rolling walker (2 wheels)   Toileting- Clothing Manipulation and Hygiene: Maximal assistance;Sit to/from stand Toileting - Clothing Manipulation Details (indicate cue type and reason): following BM/urine on [facility]            Extremity/Trunk Assessment              Vision       Perception     Praxis     Communication Communication Communication: Impaired Factors Affecting Communication: Hearing impaired   Cognition Arousal: Alert Behavior During Therapy: Anxious Cognition: Cognition impaired   Orientation impairments: Time, Situation, Place                           Following commands: Impaired Following commands impaired: Follows one step commands inconsistently      Cueing   Cueing Techniques: Verbal cues  Exercises      Shoulder Instructions       General Comments Pt educated on role of PT, current plan to transition to STR upon d/c.    Pertinent Vitals/ Pain       Pain Assessment Pain Assessment: No/denies pain Pain Intervention(s): Monitored during session  Home Living                                          Prior Functioning/Environment              Frequency  Min 2X/week        Progress Toward Goals  OT Goals(current goals can now be found in the care plan section)  Progress towards OT goals: Progressing toward goals  Acute Rehab OT Goals Patient Stated Goal: improve function/strength OT Goal Formulation: With patient Time For Goal Achievement: 03/31/24 Potential to Achieve Goals: Fair  Plan      Co-evaluation    PT/OT/SLP Co-Evaluation/Treatment: Yes Reason for Co-Treatment: For patient/therapist safety PT goals addressed during session: Mobility/safety with mobility;Balance OT goals addressed during session: ADL's and self-care      AM-PAC OT "6 Clicks" Daily Activity      Outcome Measure   Help from another person eating meals?: None Help from another person taking care of personal grooming?: A Little Help from another person toileting, which includes using toliet, bedpan, or urinal?: A Lot Help from another person bathing (including washing, rinsing, drying)?: A Lot Help from another person to put on and taking off regular upper body clothing?: A Little Help from another person to put on and taking off regular lower body clothing?: A Lot 6 Click Score: 16    End of Session Equipment Utilized During Treatment: Rolling walker (2 wheels);Gait belt;Oxygen  OT Visit Diagnosis: Unsteadiness on feet (R26.81);Repeated falls (R29.6);Muscle weakness (generalized) (M62.81)   Activity Tolerance Patient tolerated treatment well   Patient Left with call bell/phone within reach;in chair;with chair alarm set   Nurse Communication Mobility status        Time: 1914-7829 OT Time Calculation (min): 24 min  Charges: OT General Charges $OT Visit: 1  Visit OT Treatments $Self Care/Home Management : 8-22 mins  Sharol Croghan, OTR/L  03/19/24, 3:37 PM   Aaban Griep E Chimaobi Casebolt 03/19/2024, 3:32 PM

## 2024-03-19 NOTE — Care Management Important Message (Signed)
 Important Message  Patient Details  Name: [name] [name] MRN: [id number] Date of Birth: Jul 31, 1935   Important Message Given:  Yes - Medicare IM     Anise Kerns 03/19/2024, 1:46 PM

## 2024-03-19 NOTE — Progress Notes (Signed)
 Progress Note   Patient: [name] [name] ZOX:[id number] DOB: [birth year] DOA: 03/16/2024     3 DOS: the patient was seen and examined on 03/19/2024    Brief hospital course: From HPI "[name] [name] is a 89 y.o. female with medical history significant of HFrEF with last EF of 30-35% (2021), facial basal cell carcinoma s/p radiation (2025), atrial fibrillation, CAD s/p DES to mid LAD (2021), venous insufficiency, failure to thrive, who presents to the ED due to leg swelling. Patient saw her PCP with her sister on 03/02/2024, at which time her sister noted that patient had been declining, becoming more forgetful, and family was worried she is no longer safe to be living independently.   ED course: On arrival to the ED, patient was hypertensive at 145/90 with heart rate of 115.  She was saturating at 100% on room air.  She was afebrile 97.9.  Initial workup notable for hemoglobin of 7.8, bicarb 21, BUN 25, creatinine 1.44, GFR 35.  BNP 919.  Troponin 24 and then 22.  Chest x-ray with mild bibasilar atelectasis with associated pleural effusions.  Patient started on IV Lasix  and TRH contacted for admission.  "   Assessment and Plan:   Acute on chronic HFrEF (heart failure with reduced ejection fraction) (HCC) Per chart review, patient has a history of HFrEF with EF of 30% when admitted in 2021 for NSTEMI.   Continue telemetry Continue IV Lasix  Echo shows EF 60 to 65% with indeterminate diastolic parameters Palliative care consulted for goals of care discussion   AKI (acute kidney injury) (HCC) Elevated creatinine at 1.44 with previous elevations also noted over the past few months, although there was concern this is due to lack of access to food.  Given hypervolemia, concerned this may be due to progressive cardiac disease. We will monitor renal function closely given diuresis   Cognitive decline TOC on board Palliative care on board Continue delirium precaution   Normocytic anemia Chronic anemia  with baseline hemoglobin between 9 and 10 however presented with hemoglobin 7.8 - Transfuse for hemoglobin less than 7 Follow-up vitamin B12, iron  panel, ferritin   Protein-calorie malnutrition, severe (HCC) Severe protein calorie malnutrition with BMI of 17.   Concerned this may be due to lack of access to food.   - Dietitian consulted   Coronary artery disease of native artery of native heart with stable angina pectoris (HCC) History of CAD s/p PCI with DES to mid LAD in 2021.  Patient denies any chest pain at this time. Continue home aspirin    Atrial fibrillation, chronic (HCC) Patient is no longer on anticoagulation.   - Continue home metoprolol    Hypertension - Hold home antihypertensives given AKI   Advance Care Planning:   Code Status: Full Code as patient is unable to make medical decisions and no surrogate at bedside   Consults: None   Family Communication: No family at bedside     Subjective:    Patient seen and examined at bedside this morning Denies any acute overnight event TOC working on placement   Physical Exam: Vitals and nursing note reviewed.  Constitutional:      General: She is not in acute distress.    Appearance: She is not toxic-appearing.  HENT:     Head: Normocephalic and atraumatic.  Eyes:     Conjunctiva/sclera: Conjunctivae normal.     Pupils: Pupils are equal, round, and reactive to light.  Cardiovascular: Heart sounds 1 and 2 present irregular rhythm Pulmonary: Decreased air entry  bibasilarly Abdominal:    Tenderness: There is no abdominal tenderness.  Skin:    General: Skin is warm and dry.     Comments: Senile purpura  Neurological:     Mental Status: She is alert.  However only oriented to place and person but disoriented to time Psychiatric:        Mood and Affect: Mood and affect normal.         Family Communication: Spoke with patient's sister over the phone   Disposition: Status is: Inpatient   Time spent: 50 minutes      Data Reviewed:    Vitals:   03/19/24 1247 03/19/24 1248 03/19/24 1523 03/19/24 1527  BP: (!) 155/73   (!) 151/88  Pulse: (!) 122 94  (!) 57  Resp:      Temp: (!) 97.2 F (36.2 C)   98.2 F (36.8 C)  TempSrc:      SpO2:  (!) 86% 95% 100%  Weight:      Height:          Latest Ref Rng & Units 03/19/2024    6:47 AM 03/18/2024    4:01 AM 03/17/2024    4:56 AM  CBC  WBC 4.0 - 10.5 K/uL 8.0  6.0  7.5   Hemoglobin 12.0 - 15.0 g/dL 8.5  8.3  6.9   Hematocrit 36.0 - 46.0 % 27.6  27.4  23.4   Platelets 150 - 400 K/uL 278  264  261        Latest Ref Rng & Units 03/19/2024    6:47 AM 03/18/2024    4:01 AM 03/17/2024    4:56 AM  BMP  Glucose 70 - 99 mg/dL 952  841  324   BUN 8 - 23 mg/dL 30  25  28    Creatinine 0.44 - 1.00 mg/dL 4.01  0.27  2.53   Sodium 135 - 145 mmol/L 135  137  139   Potassium 3.5 - 5.1 mmol/L 4.4  4.0  4.6   Chloride 98 - 111 mmol/L 103  104  106   CO2 22 - 32 mmol/L 26  24  26    Calcium  8.9 - 10.3 mg/dL 8.1  7.8  8.2      Author: Ezzard Holms, MD 03/19/2024 6:01 PM  For on call review [email or web address].

## 2024-03-19 NOTE — Progress Notes (Addendum)
 Mobility Specialist - Progress Note   03/19/24 1100  Mobility  Activity Ambulated with assistance in room;Stood at bedside;Transferred from bed to chair;Transferred from chair to bed  Level of Assistance Moderate assist, patient does 50-74%  Assistive Device Front wheel walker  Distance Ambulated (ft) 4 ft  Activity Response Tolerated well  Mobility visit 1 Mobility     Pt lying in bed upon arrival, utilizing RA. Pt initially reluctant, but agreeable to activity--no encouragement needed. AO to self and location. Completed bed mobility with minA. Mild post lean in sitting, corrected. STS with modA +2. Pt very unexpectedly and spontaneously returns to seated position; requiring assist for safe descend. Pt assisted back to upright position. Assist for RW management and balance. Pt places RW on top of feet in attempt to step outside BOS; multimodal cueing and management for proper biomechanics. Pt took a few steps before incontinent BM. Wide BOS and favors R side. Pt assisted with peri-care before return to bed. Post-session, pt voices feeling SOB. O2 98% at rest but pt still reports feeling like she is "gasping for air". Pt left supine with alarm set, needs in reach. RN notified.    Searcy Czech Mobility Specialist 03/19/24, 12:07 PM

## 2024-03-20 DIAGNOSIS — I5023 Acute on chronic systolic (congestive) heart failure: Secondary | ICD-10-CM | POA: Diagnosis not present

## 2024-03-20 LAB — CBC WITH DIFFERENTIAL/PLATELET
Abs Immature Granulocytes: 0.04 10*3/uL (ref 0.00–0.07)
Basophils Absolute: 0 10*3/uL (ref 0.0–0.1)
Basophils Relative: 0 %
Eosinophils Absolute: 0.1 10*3/uL (ref 0.0–0.5)
Eosinophils Relative: 1 %
HCT: 27.1 % — ABNORMAL LOW (ref 36.0–46.0)
Hemoglobin: 8.2 g/dL — ABNORMAL LOW (ref 12.0–15.0)
Immature Granulocytes: 1 %
Lymphocytes Relative: 8 %
Lymphs Abs: 0.7 10*3/uL (ref 0.7–4.0)
MCH: 26.8 pg (ref 26.0–34.0)
MCHC: 30.3 g/dL (ref 30.0–36.0)
MCV: 88.6 fL (ref 80.0–100.0)
Monocytes Absolute: 0.7 10*3/uL (ref 0.1–1.0)
Monocytes Relative: 9 %
Neutro Abs: 7 10*3/uL (ref 1.7–7.7)
Neutrophils Relative %: 81 %
Platelets: 252 10*3/uL (ref 150–400)
RBC: 3.06 MIL/uL — ABNORMAL LOW (ref 3.87–5.11)
RDW: 16 % — ABNORMAL HIGH (ref 11.5–15.5)
WBC: 8.6 10*3/uL (ref 4.0–10.5)
nRBC: 0 % (ref 0.0–0.2)

## 2024-03-20 LAB — BASIC METABOLIC PANEL WITH GFR
Anion gap: 7 (ref 5–15)
BUN: 34 mg/dL — ABNORMAL HIGH (ref 8–23)
CO2: 29 mmol/L (ref 22–32)
Calcium: 8 mg/dL — ABNORMAL LOW (ref 8.9–10.3)
Chloride: 101 mmol/L (ref 98–111)
Creatinine, Ser: 1.11 mg/dL — ABNORMAL HIGH (ref 0.44–1.00)
GFR, Estimated: 48 mL/min — ABNORMAL LOW (ref >60)
Glucose, Bld: 99 mg/dL (ref 70–99)
Potassium: 4.4 mmol/L (ref 3.5–5.1)
Sodium: 137 mmol/L (ref 135–145)

## 2024-03-20 NOTE — Significant Event (Signed)
       CROSS COVER NOTE  NAME: [name] [name] MRN: [id number] DOB : 11-Dec-1934 ATTENDING PHYSICIAN: Ezzard Holms, MD    Date of Service   03/20/2024   HPI/Events of Note   Acute urinary retention  Interventions   Assessment/Plan: In and out cath        Kip Peon NP [facility] Cross Cover 7pm-7am - check amion for availability Pager [phone]

## 2024-03-20 NOTE — TOC Progression Note (Signed)
 Transition of Care [facility]) - Progression Note    Patient Details  Name: [name] [name] MRN: [id number] Date of Birth: 1935/01/12  [facility] CM/SW Contact  Augustus Ledger Audry Blinks, RN Phone Number: 03/20/2024, 11:58 AM  Clinical Narrative:    10:08 AM-TOC RNCM followed up with Tammy liaison from [facility] and left a HIPAA voice message requesting a call back concerning possible bed offer for next level of care. Currently awaiting a call back.   TOC remains available.   Expected Discharge Plan: Skilled Nursing Facility Barriers to Discharge: Continued Medical Work up  Expected Discharge Plan and Services     Post Acute Care Choice: Skilled Nursing Facility Living arrangements for the past 2 months: Single Family Home                                       Social Determinants of Health (SDOH) Interventions SDOH Screenings   Food Insecurity: No Food Insecurity (03/17/2024)  Housing: Low Risk  (03/17/2024)  Transportation Needs: No Transportation Needs (03/17/2024)  Utilities: Not At Risk (03/17/2024)  Depression (PHQ2-9): Low Risk  (10/29/2023)  Physical Activity: Insufficiently Active (12/25/2022)  Social Connections: Socially Isolated (03/17/2024)  Stress: Stress Concern Present (12/25/2022)  Tobacco Use: Medium Risk (03/16/2024)    Readmission Risk Interventions     No data to display

## 2024-03-20 NOTE — Progress Notes (Signed)
 Progress Note   Patient: [name] [name] JXB:[id number] DOB: 14-Dec-1934 DOA: 03/16/2024     4 DOS: the patient was seen and examined on 03/20/2024     Brief hospital course: From HPI "[name] [name] is a 89 y.o. female with medical history significant of HFrEF with last EF of 30-35% (2021), facial basal cell carcinoma s/p radiation (2025), atrial fibrillation, CAD s/p DES to mid LAD (2021), venous insufficiency, failure to thrive, who presents to the ED due to leg swelling. Patient saw her PCP with her sister on 03/02/2024, at which time her sister noted that patient had been declining, becoming more forgetful, and family was worried she is no longer safe to be living independently.   ED course: On arrival to the ED, patient was hypertensive at 145/90 with heart rate of 115.  She was saturating at 100% on room air.  She was afebrile 97.9.  Initial workup notable for hemoglobin of 7.8, bicarb 21, BUN 25, creatinine 1.44, GFR 35.  BNP 919.  Troponin 24 and then 22.  Chest x-ray with mild bibasilar atelectasis with associated pleural effusions.  Patient started on IV Lasix  and TRH contacted for admission.  "   Assessment and Plan:   Acute on chronic HFrEF (heart failure with reduced ejection fraction) (HCC) Per chart review, patient has a history of HFrEF with EF of 30% when admitted in 2021 for NSTEMI.   Continue telemetry Continue IV Lasix  Echo shows EF 60 to 65% with indeterminate diastolic parameters Palliative care consulted for goals of care discussion   AKI (acute kidney injury) (HCC) Elevated creatinine at 1.44 with previous elevations also noted over the past few months, although there was concern this is due to lack of access to food.  Given hypervolemia, concerned this may be due to progressive cardiac disease. We will monitor renal function closely given diuresis   Cognitive decline TOC on board Palliative care on board Continue delirium precaution   Normocytic anemia Chronic  anemia with baseline hemoglobin between 9 and 10 however presented with hemoglobin 7.8 - Transfuse for hemoglobin less than 7 Follow-up vitamin B12, iron  panel, ferritin   Protein-calorie malnutrition, severe (HCC) Severe protein calorie malnutrition with BMI of 17.   Concerned this may be due to lack of access to food.   - Dietitian consulted   Coronary artery disease of native artery of native heart with stable angina pectoris (HCC) History of CAD s/p PCI with DES to mid LAD in 2021.  Patient denies any chest pain at this time. Continue home aspirin    Atrial fibrillation, chronic (HCC) Patient is no longer on anticoagulation.   - Continue home metoprolol    Hypertension - Hold home antihypertensives given AKI   Advance Care Planning:   Code Status: Full Code as patient is unable to make medical decisions and no surrogate at bedside   Consults: None   Family Communication: No family at bedside     Subjective:    Patient seen and examined at bedside this morning TOC working on placement Patient denies any acute complaints   Physical Exam: Vitals and nursing note reviewed.  Constitutional:      General: She is not in acute distress.    Appearance: She is not toxic-appearing.  HENT:     Head: Normocephalic and atraumatic.  Eyes:     Conjunctiva/sclera: Conjunctivae normal.     Pupils: Pupils are equal, round, and reactive to light.  Cardiovascular: Heart sounds 1 and 2 present irregular rhythm Pulmonary: Decreased air  entry bibasilarly Abdominal:    Tenderness: There is no abdominal tenderness.  Skin:    General: Skin is warm and dry.     Comments: Senile purpura  Neurological:     Mental Status: She is alert.  However only oriented to place and person but disoriented to time Psychiatric:        Mood and Affect: Mood and affect normal.         Family Communication: Spoke with patient's sister over the phone   Disposition: Status is: Inpatient   Time spent: 42  minutes     Data Reviewed:        Latest Ref Rng & Units 03/20/2024    3:46 AM 03/19/2024    6:47 AM 03/18/2024    4:01 AM  CBC  WBC 4.0 - 10.5 K/uL 8.6  8.0  6.0   Hemoglobin 12.0 - 15.0 g/dL 8.2  8.5  8.3   Hematocrit 36.0 - 46.0 % 27.1  27.6  27.4   Platelets 150 - 400 K/uL 252  278  264        Latest Ref Rng & Units 03/20/2024    3:46 AM 03/19/2024    6:47 AM 03/18/2024    4:01 AM  BMP  Glucose 70 - 99 mg/dL 99  621  308   BUN 8 - 23 mg/dL 34  30  25   Creatinine 0.44 - 1.00 mg/dL 6.57  8.46  9.62   Sodium 135 - 145 mmol/L 137  135  137   Potassium 3.5 - 5.1 mmol/L 4.4  4.4  4.0   Chloride 98 - 111 mmol/L 101  103  104   CO2 22 - 32 mmol/L 29  26  24    Calcium  8.9 - 10.3 mg/dL 8.0  8.1  7.8      Vitals:   03/20/24 0059 03/20/24 0537 03/20/24 0818 03/20/24 1146  BP: (!) 152/77 (!) 158/89 (!) 155/73 (!) 142/66  Pulse: 73 65 76 (!) 45  Resp: 18 19 16    Temp: 98.5 F (36.9 C) 98 F (36.7 C) 97.7 F (36.5 C) (!) 97.5 F (36.4 C)  TempSrc: Oral Oral    SpO2: 100% 100% 100% 100%  Weight:      Height:         Author: Ezzard Holms, MD 03/20/2024 4:14 PM  For on call review [email or web address].

## 2024-03-20 NOTE — Plan of Care (Signed)

## 2024-03-21 DIAGNOSIS — I5023 Acute on chronic systolic (congestive) heart failure: Secondary | ICD-10-CM | POA: Diagnosis not present

## 2024-03-21 LAB — CBC WITH DIFFERENTIAL/PLATELET
Abs Immature Granulocytes: 0.04 10*3/uL (ref 0.00–0.07)
Basophils Absolute: 0 10*3/uL (ref 0.0–0.1)
Basophils Relative: 0 %
Eosinophils Absolute: 0.1 10*3/uL (ref 0.0–0.5)
Eosinophils Relative: 1 %
HCT: 28 % — ABNORMAL LOW (ref 36.0–46.0)
Hemoglobin: 8.4 g/dL — ABNORMAL LOW (ref 12.0–15.0)
Immature Granulocytes: 0 %
Lymphocytes Relative: 7 %
Lymphs Abs: 0.7 10*3/uL (ref 0.7–4.0)
MCH: 27.1 pg (ref 26.0–34.0)
MCHC: 30 g/dL (ref 30.0–36.0)
MCV: 90.3 fL (ref 80.0–100.0)
Monocytes Absolute: 0.9 10*3/uL (ref 0.1–1.0)
Monocytes Relative: 8 %
Neutro Abs: 8.8 10*3/uL — ABNORMAL HIGH (ref 1.7–7.7)
Neutrophils Relative %: 84 %
Platelets: 278 10*3/uL (ref 150–400)
RBC: 3.1 MIL/uL — ABNORMAL LOW (ref 3.87–5.11)
RDW: 16.5 % — ABNORMAL HIGH (ref 11.5–15.5)
WBC: 10.5 10*3/uL (ref 4.0–10.5)
nRBC: 0 % (ref 0.0–0.2)

## 2024-03-21 LAB — BASIC METABOLIC PANEL WITH GFR
Anion gap: 8 (ref 5–15)
BUN: 38 mg/dL — ABNORMAL HIGH (ref 8–23)
CO2: 30 mmol/L (ref 22–32)
Calcium: 8.1 mg/dL — ABNORMAL LOW (ref 8.9–10.3)
Chloride: 98 mmol/L (ref 98–111)
Creatinine, Ser: 1.06 mg/dL — ABNORMAL HIGH (ref 0.44–1.00)
GFR, Estimated: 51 mL/min — ABNORMAL LOW (ref >60)
Glucose, Bld: 122 mg/dL — ABNORMAL HIGH (ref 70–99)
Potassium: 4.5 mmol/L (ref 3.5–5.1)
Sodium: 136 mmol/L (ref 135–145)

## 2024-03-21 NOTE — Progress Notes (Signed)
 Progress Note   Patient: [name] [name] ZOX:[id number] DOB: [birth year] DOA: 03/16/2024     5 DOS: the patient was seen and examined on 03/21/2024     Brief hospital course: From HPI "[name] [name] is a 89 y.o. female with medical history significant of HFrEF with last EF of 30-35% (2021), facial basal cell carcinoma s/p radiation (2025), atrial fibrillation, CAD s/p DES to mid LAD (2021), venous insufficiency, failure to thrive, who presents to the ED due to leg swelling. Patient saw her PCP with her sister on 03/02/2024, at which time her sister noted that patient had been declining, becoming more forgetful, and family was worried she is no longer safe to be living independently.   ED course: On arrival to the ED, patient was hypertensive at 145/90 with heart rate of 115.  She was saturating at 100% on room air.  She was afebrile 97.9.  Initial workup notable for hemoglobin of 7.8, bicarb 21, BUN 25, creatinine 1.44, GFR 35.  BNP 919.  Troponin 24 and then 22.  Chest x-ray with mild bibasilar atelectasis with associated pleural effusions.  Patient started on IV Lasix  and TRH contacted for admission.  "   Assessment and Plan:   Acute on chronic HFrEF (heart failure with reduced ejection fraction) (HCC) Per chart review, patient has a history of HFrEF with EF of 30% when admitted in 2021 for NSTEMI.   Continue telemetry Continue IV Lasix  Echo shows EF 60 to 65% with indeterminate diastolic parameters Palliative care consulted for goals of care discussion   AKI (acute kidney injury) (HCC) Elevated creatinine at 1.44 with previous elevations also noted over the past few months, although there was concern this is due to lack of access to food.  Given hypervolemia, concerned this may be due to progressive cardiac disease. We will monitor renal function closely given diuresis   Cognitive decline TOC on board Palliative care on board Continue delirium precaution   Normocytic anemia Chronic  anemia with baseline hemoglobin between 9 and 10 however presented with hemoglobin 7.8 - Transfuse for hemoglobin less than 7 Follow-up vitamin B12, iron  panel, ferritin   Protein-calorie malnutrition, severe (HCC) Severe protein calorie malnutrition with BMI of 17.   Concerned this may be due to lack of access to food.   - Dietitian consulted   Coronary artery disease of native artery of native heart with stable angina pectoris (HCC) History of CAD s/p PCI with DES to mid LAD in 2021.  Patient denies any chest pain at this time. Continue home aspirin    Atrial fibrillation, chronic (HCC) Patient is no longer on anticoagulation.   - Continue home metoprolol    Hypertension - Hold home antihypertensives given AKI   Advance Care Planning:   Code Status: Full Code as patient is unable to make medical decisions and no surrogate at bedside   Consults: None   Family Communication: No family at bedside     Subjective:    Patient seen and examined at bedside this morning TOC working on placement Denies any acute complaints   Physical Exam: Vitals and nursing note reviewed.  Constitutional:      General: She is not in acute distress.    Appearance: She is not toxic-appearing.  HENT:     Head: Normocephalic and atraumatic.  Eyes:     Conjunctiva/sclera: Conjunctivae normal.     Pupils: Pupils are equal, round, and reactive to light.  Cardiovascular: Heart sounds 1 and 2 present irregular rhythm Pulmonary: Decreased air entry  bibasilarly Abdominal:    Tenderness: There is no abdominal tenderness.  Skin:    General: Skin is warm and dry.     Comments: Senile purpura  Neurological:     Mental Status: She is alert.  However only oriented to place and person but disoriented to time Psychiatric:        Mood and Affect: Mood and affect normal.         Family Communication: Spoke with patient's sister over the phone   Disposition: Status is: Inpatient   Time spent: 42  minutes     Data Reviewed:  Vitals:   03/20/24 1947 03/21/24 0005 03/21/24 0451 03/21/24 0826  BP: 138/73 (!) 148/64 (!) 152/83 (!) 166/97  Pulse: 91 84 82   Resp: 20 18 16 18   Temp: (!) 97.5 F (36.4 C) (!) 97.2 F (36.2 C) 97.9 F (36.6 C) 97.8 F (36.6 C)  TempSrc: Oral Oral Oral Oral  SpO2: 100% 99% 100% 94%  Weight:      Height:          Latest Ref Rng & Units 03/21/2024    3:04 AM 03/20/2024    3:46 AM 03/19/2024    6:47 AM  CBC  WBC 4.0 - 10.5 K/uL 10.5  8.6  8.0   Hemoglobin 12.0 - 15.0 g/dL 8.4  8.2  8.5   Hematocrit 36.0 - 46.0 % 28.0  27.1  27.6   Platelets 150 - 400 K/uL 278  252  278        Latest Ref Rng & Units 03/21/2024    3:04 AM 03/20/2024    3:46 AM 03/19/2024    6:47 AM  BMP  Glucose 70 - 99 mg/dL 161  99  096   BUN 8 - 23 mg/dL 38  34  30   Creatinine 0.44 - 1.00 mg/dL 0.45  4.09  8.11   Sodium 135 - 145 mmol/L 136  137  135   Potassium 3.5 - 5.1 mmol/L 4.5  4.4  4.4   Chloride 98 - 111 mmol/L 98  101  103   CO2 22 - 32 mmol/L 30  29  26    Calcium  8.9 - 10.3 mg/dL 8.1  8.0  8.1      Author: Ezzard Holms, MD 03/21/2024 4:10 PM  For on call review [email or web address].

## 2024-03-21 NOTE — Plan of Care (Signed)

## 2024-03-22 DIAGNOSIS — I5023 Acute on chronic systolic (congestive) heart failure: Secondary | ICD-10-CM | POA: Diagnosis not present

## 2024-03-22 LAB — CBC WITH DIFFERENTIAL/PLATELET
Abs Immature Granulocytes: 0.06 10*3/uL (ref 0.00–0.07)
Basophils Absolute: 0 10*3/uL (ref 0.0–0.1)
Basophils Relative: 0 %
Eosinophils Absolute: 0.1 10*3/uL (ref 0.0–0.5)
Eosinophils Relative: 1 %
HCT: 29.3 % — ABNORMAL LOW (ref 36.0–46.0)
Hemoglobin: 8.8 g/dL — ABNORMAL LOW (ref 12.0–15.0)
Immature Granulocytes: 1 %
Lymphocytes Relative: 8 %
Lymphs Abs: 1 10*3/uL (ref 0.7–4.0)
MCH: 27.4 pg (ref 26.0–34.0)
MCHC: 30 g/dL (ref 30.0–36.0)
MCV: 91.3 fL (ref 80.0–100.0)
Monocytes Absolute: 1.1 10*3/uL — ABNORMAL HIGH (ref 0.1–1.0)
Monocytes Relative: 10 %
Neutro Abs: 9.4 10*3/uL — ABNORMAL HIGH (ref 1.7–7.7)
Neutrophils Relative %: 80 %
Platelets: 268 10*3/uL (ref 150–400)
RBC: 3.21 MIL/uL — ABNORMAL LOW (ref 3.87–5.11)
RDW: 16.9 % — ABNORMAL HIGH (ref 11.5–15.5)
WBC: 11.6 10*3/uL — ABNORMAL HIGH (ref 4.0–10.5)
nRBC: 0 % (ref 0.0–0.2)

## 2024-03-22 LAB — BASIC METABOLIC PANEL WITH GFR
Anion gap: 7 (ref 5–15)
BUN: 45 mg/dL — ABNORMAL HIGH (ref 8–23)
CO2: 30 mmol/L (ref 22–32)
Calcium: 8.4 mg/dL — ABNORMAL LOW (ref 8.9–10.3)
Chloride: 99 mmol/L (ref 98–111)
Creatinine, Ser: 1.08 mg/dL — ABNORMAL HIGH (ref 0.44–1.00)
GFR, Estimated: 50 mL/min — ABNORMAL LOW (ref >60)
Glucose, Bld: 106 mg/dL — ABNORMAL HIGH (ref 70–99)
Potassium: 5 mmol/L (ref 3.5–5.1)
Sodium: 136 mmol/L (ref 135–145)

## 2024-03-22 NOTE — Progress Notes (Signed)
 Pt's Social Worker from [place] came to visit patient. Her name is Gareth Junes [phone]

## 2024-03-22 NOTE — Progress Notes (Signed)
 Progress Note   Patient: [name] [name] GNF:[id number] DOB: September 11, 1935 DOA: 03/16/2024     6 DOS: the patient was seen and examined on 03/22/2024    Brief hospital course: From HPI "[name] [name] is a 89 y.o. female with medical history significant of HFrEF with last EF of 30-35% (2021), facial basal cell carcinoma s/p radiation (2025), atrial fibrillation, CAD s/p DES to mid LAD (2021), venous insufficiency, failure to thrive, who presents to the ED due to leg swelling. Patient saw her PCP with her sister on 03/02/2024, at which time her sister noted that patient had been declining, becoming more forgetful, and family was worried she is no longer safe to be living independently.   ED course: On arrival to the ED, patient was hypertensive at 145/90 with heart rate of 115.  She was saturating at 100% on room air.  She was afebrile 97.9.  Initial workup notable for hemoglobin of 7.8, bicarb 21, BUN 25, creatinine 1.44, GFR 35.  BNP 919.  Troponin 24 and then 22.  Chest x-ray with mild bibasilar atelectasis with associated pleural effusions.  Patient started on IV Lasix  and TRH contacted for admission.  "   Assessment and Plan:   Acute on chronic HFrEF (heart failure with reduced ejection fraction) (HCC) Per chart review, patient has a history of HFrEF with EF of 30% when admitted in 2021 for NSTEMI.   Continue telemetry Continue IV Lasix  changed to oral hopefully tomorrow Echo shows EF 60 to 65% with indeterminate diastolic parameters Palliative care consulted for goals of care discussion   AKI (acute kidney injury) (HCC) Elevated creatinine at 1.44 with previous elevations also noted over the past few months, although there was concern this is due to lack of access to food.  Given hypervolemia, concerned this may be due to progressive cardiac disease. We will monitor renal function closely given diuresis   Cognitive decline TOC on board Palliative care on board Continue delirium precaution    Normocytic anemia Chronic anemia with baseline hemoglobin between 9 and 10 however presented with hemoglobin 7.8 - Transfuse for hemoglobin less than 7 Follow-up vitamin B12, iron  panel, ferritin   Protein-calorie malnutrition, severe (HCC) Severe protein calorie malnutrition with BMI of 17.   Concerned this may be due to lack of access to food.   - Dietitian consulted   Coronary artery disease of native artery of native heart with stable angina pectoris (HCC) History of CAD s/p PCI with DES to mid LAD in 2021.  Patient denies any chest pain at this time. Continue home aspirin    Atrial fibrillation, chronic (HCC) Patient is no longer on anticoagulation.   - Continue home metoprolol    Hypertension - Hold home antihypertensives given AKI   Advance Care Planning:   Code Status: Full Code as patient is unable to make medical decisions and no surrogate at bedside   Consults: None   Family Communication: No family at bedside     Subjective:    TOC working on placement Patient denies any acute overnight event  Physical Exam: Vitals and nursing note reviewed.  Constitutional:      General: She is not in acute distress.    Appearance: She is not toxic-appearing.  HENT:     Head: Normocephalic and atraumatic.  Eyes:     Conjunctiva/sclera: Conjunctivae normal.     Pupils: Pupils are equal, round, and reactive to light.  Cardiovascular: Heart sounds 1 and 2 present irregular rhythm Pulmonary: Decreased air entry bibasilarly Abdominal:  Tenderness: There is no abdominal tenderness.  Skin:    General: Skin is warm and dry.     Comments: Senile purpura  Neurological:     Mental Status: She is alert.  However only oriented to place and person but disoriented to time Psychiatric:        Mood and Affect: Mood and affect normal.         Family Communication: Spoke with patient's sister over the phone   Disposition: Status is: Inpatient   Time spent: 42 minutes      Data Reviewed:     Vitals:   03/22/24 0342 03/22/24 0738 03/22/24 1216 03/22/24 1550  BP: (!) 142/104 (!) 146/82 (!) 138/58 (!) 141/61  Pulse: 94 (!) 54 76 92  Resp: 16 18 16 18   Temp: 97.9 F (36.6 C) 98.3 F (36.8 C) 98.9 F (37.2 C) 97.8 F (36.6 C)  TempSrc:      SpO2: (!) 86% 100% 99% 100%  Weight:      Height:          Latest Ref Rng & Units 03/22/2024    5:05 AM 03/21/2024    3:04 AM 03/20/2024    3:46 AM  CBC  WBC 4.0 - 10.5 K/uL 11.6  10.5  8.6   Hemoglobin 12.0 - 15.0 g/dL 8.8  8.4  8.2   Hematocrit 36.0 - 46.0 % 29.3  28.0  27.1   Platelets 150 - 400 K/uL 268  278  252        Latest Ref Rng & Units 03/22/2024    5:05 AM 03/21/2024    3:04 AM 03/20/2024    3:46 AM  BMP  Glucose 70 - 99 mg/dL 409  811  99   BUN 8 - 23 mg/dL 45  38  34   Creatinine 0.44 - 1.00 mg/dL 9.14  7.82  9.56   Sodium 135 - 145 mmol/L 136  136  137   Potassium 3.5 - 5.1 mmol/L 5.0  4.5  4.4   Chloride 98 - 111 mmol/L 99  98  101   CO2 22 - 32 mmol/L 30  30  29    Calcium  8.9 - 10.3 mg/dL 8.4  8.1  8.0      Author: Ezzard Holms, MD 03/22/2024 5:26 PM  For on call review [email or web address].

## 2024-03-22 NOTE — Plan of Care (Signed)

## 2024-03-22 NOTE — Progress Notes (Signed)
 Mobility Specialist - Progress Note   03/22/24 1200  Mobility  Activity Stood at bedside;Transferred from bed to chair  Level of Assistance Moderate assist, patient does 50-74%  Assistive Device Front wheel walker  Distance Ambulated (ft) 2 ft  Activity Response Tolerated well  Mobility visit 1 Mobility     Pt lying in bed upon arrival, utilizing RA. Pt agreeable to activity. Completed bed mobility with minA for trunk support. Lateral lean to R side. Pt SOB with minimal exertion; cued for PLB. STS and step-pivot with modA + balancing. VC for hand placement. Noted soiled sheets upon standing. TotalA for peri-care d/t need for BUE support. Pt left in chair with alarm set, needs in reach. NT at bedside upon exit.    Searcy Czech Mobility Specialist 03/22/24, 12:14 PM

## 2024-03-22 NOTE — Progress Notes (Signed)
 Physical Therapy Treatment Patient Details Name: [name] [name] MRN: [id number] DOB: 1934-12-28 Today's Date: 03/22/2024   History of Present Illness [name] [name] is a 89 y.o. female with medical history significant of HFrEF with last EF of 30-35% (2021), facial basal cell carcinoma s/p radiation (2025), atrial fibrillation, CAD s/p DES to mid LAD (2021), venous insufficiency, failure to thrive, who presents to the ED due to leg swelling.    PT Comments  Pt resting in bed upon PT arrival; pt agreeable to therapy.  During session pt was mod assist with bed mobility; CGA to min assist with sitting balance; CGA progressing to min assist for sit to stand transfers (x5 trials total) and CGA to min assist to side step to L along bed a few feet with RW use.  Pt appearing easily distracted and impulsive during session requiring extra cueing for safety.  Will continue to focus on strengthening and progressive functional mobility during hospitalization.    If plan is discharge home, recommend the following: A lot of help with walking and/or transfers;A lot of help with bathing/dressing/bathroom   Can travel by private vehicle        Equipment Recommendations  Other (comment) (TBD at next level of care)    Recommendations for Other Services       Precautions / Restrictions Precautions Precautions: Fall Recall of Precautions/Restrictions: Impaired Restrictions Weight Bearing Restrictions Per Provider Order: No     Mobility  Bed Mobility Overal bed mobility: Needs Assistance Bed Mobility: Supine to Sit, Sit to Supine     Supine to sit: Mod assist Sit to supine: Mod assist   General bed mobility comments: assist for trunk and B LE's; vc's for technique (and to stay focused on task--pt appearing easily distracted)    Transfers Overall transfer level: Needs assistance Equipment used: Rolling walker (2 wheels) Transfers: Sit to/from Stand Sit to Stand: Min assist, Contact guard assist            General transfer comment: min assist 1st trial and CGA next 4 trials; vc's for UE placement    Ambulation/Gait Ambulation/Gait assistance: Contact guard assist, Min assist Gait Distance (Feet):  (sidestepped to L along bed a few feet)     Gait velocity: decreased     General Gait Details: vc's for safety; use of RW   Stairs             Wheelchair Mobility     Tilt Bed    Modified Rankin (Stroke Patients Only)       Balance Overall balance assessment: Needs assistance Sitting-balance support: No upper extremity supported, Feet supported Sitting balance-Leahy Scale: Fair Sitting balance - Comments: steady static sitting but pt appearing impulsive and changing positions frequently requiring CGA to min assist for safety   Standing balance support: Bilateral upper extremity supported, Reliant on assistive device for balance Standing balance-Leahy Scale: Fair Standing balance comment: steady static standing with B UE support on RW                            Communication Communication Communication: Impaired Factors Affecting Communication: Hearing impaired  Cognition Arousal: Alert Behavior During Therapy: Anxious, Impulsive   PT - Cognitive impairments: History of cognitive impairments, Awareness, Attention, Problem solving, Safety/Judgement                       PT - Cognition Comments: Pt perseverating on trying to find her sister  at her home. Following commands: Impaired Following commands impaired: Follows one step commands inconsistently    Cueing Cueing Techniques: Verbal cues  Exercises General Exercises - Lower Extremity Long Arc Quad: AROM, Strengthening, Both, 10 reps, Seated    General Comments        Pertinent Vitals/Pain Pain Assessment Pain Assessment: Faces Faces Pain Scale: Hurts little more Pain Location: B LE's Pain Descriptors / Indicators: Grimacing, Discomfort, Aching, Guarding Pain  Intervention(s): Limited activity within patient's tolerance, Monitored during session, Repositioned    Home Living                          Prior Function            PT Goals (current goals can now be found in the care plan section) Acute Rehab PT Goals Patient Stated Goal: go home PT Goal Formulation: Patient unable to participate in goal setting Time For Goal Achievement: 03/31/24 Progress towards PT goals: Progressing toward goals    Frequency    Min 1X/week      PT Plan      Co-evaluation              AM-PAC PT "6 Clicks" Mobility   Outcome Measure  Help needed turning from your back to your side while in a flat bed without using bedrails?: A Little Help needed moving from lying on your back to sitting on the side of a flat bed without using bedrails?: A Little Help needed moving to and from a bed to a chair (including a wheelchair)?: A Little Help needed standing up from a chair using your arms (e.g., wheelchair or bedside chair)?: A Little Help needed to walk in hospital room?: A Little Help needed climbing 3-5 steps with a railing? : A Lot 6 Click Score: 17    End of Session Equipment Utilized During Treatment: Gait belt;Oxygen Activity Tolerance: Patient tolerated treatment well Patient left: in bed;with call bell/phone within reach;with bed alarm set Nurse Communication: Mobility status;Precautions;Other (comment) (pt's wheezing with activity) PT Visit Diagnosis: Unsteadiness on feet (R26.81);Other abnormalities of gait and mobility (R26.89);Muscle weakness (generalized) (M62.81);Difficulty in walking, not elsewhere classified (R26.2);History of falling (Z91.81)     Time: 1610-9604 PT Time Calculation (min) (ACUTE ONLY): 18 min  Charges:    $Therapeutic Activity: 8-22 mins PT General Charges $$ ACUTE PT VISIT: 1 Visit                     [name], PT 03/22/24, 4:33 PM

## 2024-03-22 NOTE — TOC Progression Note (Addendum)
 Transition of Care [facility]) - Progression Note    Patient Details  Name: [name] [name] MRN: [id number] Date of Birth: Feb 19, 1935  [facility] CM/SW Contact  Baird Bombard, RN Phone Number: 03/22/2024, 11:41 AM  Clinical Narrative:    Attempt to contact Tammy at [facility] regarding bed offer. No answer left a message.   Spoke with Gena at [facility]. SSN provided so benefits can be determined. Per Gena patient is open with [facility] from 03/19/23-9/2/205. Patient's hospice benefit would have to be revoked if she will discharge to SNF.     Expected Discharge Plan: Skilled Nursing Facility Barriers to Discharge: Continued Medical Work up  Expected Discharge Plan and Services     Post Acute Care Choice: Skilled Nursing Facility Living arrangements for the past 2 months: Single Family Home                                       Social Determinants of Health (SDOH) Interventions SDOH Screenings   Food Insecurity: No Food Insecurity (03/17/2024)  Housing: Low Risk  (03/17/2024)  Transportation Needs: No Transportation Needs (03/17/2024)  Utilities: Not At Risk (03/17/2024)  Depression (PHQ2-9): Low Risk  (10/29/2023)  Physical Activity: Insufficiently Active (12/25/2022)  Social Connections: Socially Isolated (03/17/2024)  Stress: Stress Concern Present (12/25/2022)  Tobacco Use: Medium Risk (03/16/2024)    Readmission Risk Interventions     No data to display

## 2024-03-22 NOTE — Progress Notes (Signed)
 Occupational Therapy Treatment Patient Details Name: [name] [name] MRN: [id number] DOB: 05-May-1935 Today's Date: 03/22/2024   History of present illness [name] [name] is a 89 y.o. female with medical history significant of HFrEF with last EF of 30-35% (2021), facial basal cell carcinoma s/p radiation (2025), atrial fibrillation, CAD s/p DES to mid LAD (2021), venous insufficiency, failure to thrive, who presents to the ED due to leg swelling.   OT comments  Upon entering the room, pt supine in bed and continues to be confused. Pt is oriented to self only. Pt's gown is soiled with lunch food items. Mod A for supine >sit for trunk support. Pt needing min - mod A for static sitting balance with R lateral lean in seated position. Max A to doff soiled gown and don clean one. Pt reporting increased pain in B LE's while sitting on side of bed. RN called for pain medications per pt request. Pt declined attempts to stand secondary to pain. Sit>supine with mod A for trunk and B LEs. NT arrives to take vitals. All needs within reach and bed alarm activated.       If plan is discharge home, recommend the following:  A lot of help with bathing/dressing/bathroom;Assistance with cooking/housework;Assist for transportation;Help with stairs or ramp for entrance;Supervision due to cognitive status;Direct supervision/assist for financial management;Direct supervision/assist for medications management;A little help with walking and/or transfers   Equipment Recommendations  Other (comment) (defer to next venue of care)       Precautions / Restrictions Precautions Precautions: Fall Recall of Precautions/Restrictions: Impaired       Mobility Bed Mobility Overal bed mobility: Needs Assistance Bed Mobility: Supine to Sit, Sit to Supine     Supine to sit: Mod assist Sit to supine: Mod assist        Transfers                   General transfer comment: Pt refused secondary to B LE pain      Balance Overall balance assessment: Needs assistance Sitting-balance support: Feet supported Sitting balance-Leahy Scale: Poor   Postural control: Right lateral lean                                 ADL either performed or assessed with clinical judgement   ADL Overall ADL's : Needs assistance/impaired                 Upper Body Dressing : Moderate assistance;Sitting                          Extremity/Trunk Assessment Upper Extremity Assessment Upper Extremity Assessment: Generalized weakness   Lower Extremity Assessment Lower Extremity Assessment: Generalized weakness   Cervical / Trunk Assessment Cervical / Trunk Assessment: Kyphotic    Vision Patient Visual Report: No change from baseline           Communication Communication Communication: Impaired Factors Affecting Communication: Hearing impaired   Cognition Arousal: Alert Behavior During Therapy: Anxious Cognition: Cognition impaired   Orientation impairments: Time, Situation, Place Awareness: Intellectual awareness impaired, Online awareness impaired   Attention impairment (select first level of impairment): Sustained attention Executive functioning impairment (select all impairments): Sequencing, Reasoning, Problem solving                   Following commands: Impaired Following commands impaired: Follows one step commands inconsistently  Cueing   Cueing Techniques: Verbal cues  Exercises              Pertinent Vitals/ Pain       Pain Assessment Pain Assessment: Faces Faces Pain Scale: Hurts even more Pain Location: B LEs Pain Descriptors / Indicators: Grimacing, Discomfort, Aching, Guarding Pain Intervention(s): Limited activity within patient's tolerance, Monitored during session, Repositioned, Patient requesting pain meds-RN notified         Frequency  Min 2X/week        Progress Toward Goals  OT Goals(current goals can now be found in  the care plan section)  Progress towards OT goals: Progressing toward goals  Acute Rehab OT Goals OT Goal Formulation: With patient Time For Goal Achievement: 03/31/24  Plan      AM-PAC OT "6 Clicks" Daily Activity     Outcome Measure   Help from another person eating meals?: None Help from another person taking care of personal grooming?: A Little Help from another person toileting, which includes using toliet, bedpan, or urinal?: A Lot Help from another person bathing (including washing, rinsing, drying)?: A Lot Help from another person to put on and taking off regular upper body clothing?: A Little Help from another person to put on and taking off regular lower body clothing?: A Lot 6 Click Score: 16    End of Session Equipment Utilized During Treatment: Rolling walker (2 wheels);Gait belt;Oxygen  OT Visit Diagnosis: Unsteadiness on feet (R26.81);Repeated falls (R29.6);Muscle weakness (generalized) (M62.81)   Activity Tolerance Patient limited by pain   Patient Left with call bell/phone within reach;in bed;with bed alarm set   Nurse Communication Mobility status;Patient requests pain meds        Time: 1535-1550 OT Time Calculation (min): 15 min  Charges: OT General Charges $OT Visit: 1 Visit OT Treatments $Self Care/Home Management : 8-22 mins  George Kinder, MS, OTR/L , CBIS ascom [phone]  03/22/24, 4:12 PM

## 2024-03-23 DIAGNOSIS — I5023 Acute on chronic systolic (congestive) heart failure: Secondary | ICD-10-CM | POA: Diagnosis not present

## 2024-03-23 LAB — CBC WITH DIFFERENTIAL/PLATELET
Abs Immature Granulocytes: 0.04 10*3/uL (ref 0.00–0.07)
Basophils Absolute: 0 10*3/uL (ref 0.0–0.1)
Basophils Relative: 0 %
Eosinophils Absolute: 0.1 10*3/uL (ref 0.0–0.5)
Eosinophils Relative: 1 %
HCT: 26.5 % — ABNORMAL LOW (ref 36.0–46.0)
Hemoglobin: 7.9 g/dL — ABNORMAL LOW (ref 12.0–15.0)
Immature Granulocytes: 1 %
Lymphocytes Relative: 9 %
Lymphs Abs: 0.8 10*3/uL (ref 0.7–4.0)
MCH: 27.2 pg (ref 26.0–34.0)
MCHC: 29.8 g/dL — ABNORMAL LOW (ref 30.0–36.0)
MCV: 91.4 fL (ref 80.0–100.0)
Monocytes Absolute: 0.7 10*3/uL (ref 0.1–1.0)
Monocytes Relative: 8 %
Neutro Abs: 6.8 10*3/uL (ref 1.7–7.7)
Neutrophils Relative %: 81 %
Platelets: 261 10*3/uL (ref 150–400)
RBC: 2.9 MIL/uL — ABNORMAL LOW (ref 3.87–5.11)
RDW: 17.5 % — ABNORMAL HIGH (ref 11.5–15.5)
WBC: 8.4 10*3/uL (ref 4.0–10.5)
nRBC: 0 % (ref 0.0–0.2)

## 2024-03-23 LAB — BASIC METABOLIC PANEL WITH GFR
Anion gap: 9 (ref 5–15)
BUN: 41 mg/dL — ABNORMAL HIGH (ref 8–23)
CO2: 30 mmol/L (ref 22–32)
Calcium: 8.2 mg/dL — ABNORMAL LOW (ref 8.9–10.3)
Chloride: 97 mmol/L — ABNORMAL LOW (ref 98–111)
Creatinine, Ser: 0.98 mg/dL (ref 0.44–1.00)
GFR, Estimated: 56 mL/min — ABNORMAL LOW (ref >60)
Glucose, Bld: 98 mg/dL (ref 70–99)
Potassium: 4.7 mmol/L (ref 3.5–5.1)
Sodium: 136 mmol/L (ref 135–145)

## 2024-03-23 MED ORDER — ENOXAPARIN SODIUM 40 MG/0.4ML IJ SOSY
40.0000 mg | PREFILLED_SYRINGE | INTRAMUSCULAR | Status: DC
  Administered 2024-03-23 – 2024-03-28 (×6): 40 mg via SUBCUTANEOUS
  Filled 2024-03-23 (×6): qty 0.4

## 2024-03-23 MED ORDER — FUROSEMIDE 40 MG PO TABS
40.0000 mg | ORAL_TABLET | Freq: Every day | ORAL | Status: DC
  Administered 2024-03-24: 40 mg via ORAL
  Filled 2024-03-23 (×2): qty 1

## 2024-03-23 NOTE — Plan of Care (Signed)

## 2024-03-23 NOTE — Consult Note (Signed)
 WOC Nurse wound follow up Wound type: Chronic venous ulcers - Bilateral Unna boot Measurement: Left leg - 3 ulcers aprox. 1 cm x 1 cm, 100% dark red. Top of the upper leg. 1 ulcer on the malleolar posterior, 50%yellow, 50% red. Drainage (amount, consistency, odor) Scant amout. No odor. Periwound: intact. Dressing procedure/placement/frequency: Cleanse with warm water and gauze. Apply Mepitel on the wounds. Wrap the leg with Unna boot and Coban. Change twice a week.   Measurement: Right leg - 3 ulcers 100% pink granulation with partial epithelization, aprox. 1 cm x 1 cm. (2 Below the knee, 1 midline tibial) Malleolar anterior 1.5 cm x 1.5 cm 100% red Drainage (amount, consistency, odor) Scant amout. No odor. Periwound: intact. Dressing procedure/placement/frequency: Cleanse with warm water and gauze. Apply Xeroform on the wounds. Wrap the leg with Unna boot and Coban. Change every 7 days.   WOC team will change her unna boot on FRI.  Please reconsult if further assistance is needed. Thank-you,  Rachel Budds BSN, RN, [employer], WOC  (Pager: [phone])

## 2024-03-23 NOTE — Progress Notes (Signed)
 Progress Note   Patient: [name] [name] WUJ:[id number] DOB: 05/09/1935 DOA: 03/16/2024     7 DOS: the patient was seen and examined on 03/23/2024     Brief hospital course: From HPI "[name] [name] is a 89 y.o. female with medical history significant of HFrEF with last EF of 30-35% (2021), facial basal cell carcinoma s/p radiation (2025), atrial fibrillation, CAD s/p DES to mid LAD (2021), venous insufficiency, failure to thrive, who presents to the ED due to leg swelling. Patient saw her PCP with her sister on 03/02/2024, at which time her sister noted that patient had been declining, becoming more forgetful, and family was worried she is no longer safe to be living independently.   ED course: On arrival to the ED, patient was hypertensive at 145/90 with heart rate of 115.  She was saturating at 100% on room air.  She was afebrile 97.9.  Initial workup notable for hemoglobin of 7.8, bicarb 21, BUN 25, creatinine 1.44, GFR 35.  BNP 919.  Troponin 24 and then 22.  Chest x-ray with mild bibasilar atelectasis with associated pleural effusions.  Patient started on IV Lasix  and TRH contacted for admission.  "   Assessment and Plan:   Acute on chronic HFrEF (heart failure with reduced ejection fraction) (HCC) Per chart review, patient has a history of HFrEF with EF of 30% when admitted in 2021 for NSTEMI.   Continue telemetry IV Lasix  switched to oral to start on 03/24/2024 Echo shows EF 60 to 65% with indeterminate diastolic parameters Palliative care consulted for goals of care discussion Continue metoprolol  Add other GDMT as blood pressure allows   AKI (acute kidney injury) (HCC)-resolved Likely in the setting of cardiorenal syndrome Creatinine currently within normal limits with levels of 0.8 today Continue to monitor renal function   Cognitive decline TOC on board Palliative care on board Continue delirium precaution   Normocytic anemia secondary to iron  deficiency anemia Chronic anemia  with baseline hemoglobin between 9 and 10 however presented with hemoglobin 7.8 - Transfuse for hemoglobin less than 7 B12 level 692, low ferritin and iron  level with relatively high TIBC Continue iron  replacement   Protein-calorie malnutrition, severe (HCC) Severe protein calorie malnutrition with BMI of 17.   Concerned this may be due to lack of access to food. Dietitian consulted   Coronary artery disease of native artery of native heart with stable angina pectoris (HCC) History of CAD s/p PCI with DES to mid LAD in 2021.  Patient denies any chest pain at this time. Continue aspirin    Atrial fibrillation, chronic (HCC) Patient is no longer on anticoagulation.   Continue home metoprolol    Hypertension Continue above antihypertensives   Advance Care Planning:   Code Status: DNR  Consults: None   Family Communication: Discussed with patient's Sister [name] [name]     Subjective:    Patient laying in bed in no obvious distress still exhibiting symptoms of dementia TOC working on placement She denies nausea vomiting abdominal pain chest pain or cough   Physical Exam: Vitals and nursing note reviewed.  Constitutional:      General: She is not in acute distress.    Appearance: She is not toxic-appearing.  HENT:     Head: Normocephalic and atraumatic.  Eyes:     Conjunctiva/sclera: Conjunctivae normal.     Pupils: Pupils are equal, round, and reactive to light.  Cardiovascular: Heart sounds 1 and 2 present irregular rhythm Pulmonary: Decreased air entry bibasilarly Abdominal:    Tenderness: There  is no abdominal tenderness.  Skin:    General: Skin is warm and dry.     Comments: Senile purpura  Neurological:     Mental Status: She is alert.  However only oriented to place and person but disoriented to time Psychiatric:        Mood and Affect: Mood and affect normal.         Family Communication: Spoke with patient's sister over the phone   Disposition: Status is:  Inpatient   Time spent: 40 minutes     Data Reviewed:        Latest Ref Rng & Units 03/23/2024    6:12 AM 03/22/2024    5:05 AM 03/21/2024    3:04 AM  CBC  WBC 4.0 - 10.5 K/uL 8.4  11.6  10.5   Hemoglobin 12.0 - 15.0 g/dL 7.9  8.8  8.4   Hematocrit 36.0 - 46.0 % 26.5  29.3  28.0   Platelets 150 - 400 K/uL 261  268  278        Latest Ref Rng & Units 03/23/2024    6:12 AM 03/22/2024    5:05 AM 03/21/2024    3:04 AM  BMP  Glucose 70 - 99 mg/dL 98  098  119   BUN 8 - 23 mg/dL 41  45  38   Creatinine 0.44 - 1.00 mg/dL 1.47  8.29  5.62   Sodium 135 - 145 mmol/L 136  136  136   Potassium 3.5 - 5.1 mmol/L 4.7  5.0  4.5   Chloride 98 - 111 mmol/L 97  99  98   CO2 22 - 32 mmol/L 30  30  30    Calcium  8.9 - 10.3 mg/dL 8.2  8.4  8.1       Vitals:   03/22/24 2307 03/23/24 0310 03/23/24 0750 03/23/24 1203  BP: 132/73 109/80 (!) 171/80 125/78  Pulse: 76 68 82 88  Resp: 16 16    Temp: 98.2 F (36.8 C) 97.8 F (36.6 C) (!) 97.5 F (36.4 C) 97.6 F (36.4 C)  TempSrc:      SpO2: 100% 100% 100% 100%  Weight:      Height:        Disposition: Currently awaiting placement  Author: Ezzard Holms, MD 03/23/2024 2:06 PM  For on call review [email or web address].

## 2024-03-23 NOTE — Progress Notes (Addendum)
 Nutrition Follow-up  DOCUMENTATION CODES:   Underweight, Severe malnutrition in context of chronic illness  INTERVENTION:   -Continue with regular diet for widest variety of meal selections -Continue Ensure Enlive po TID, each supplement provides 350 kcal and 20 grams of protein.  -Continue Magic cup TID with meals, each supplement provides 290 kcal and 9 grams of protein  -Continue MVI with minerals daily -Continue feeding assistance with meals  NUTRITION DIAGNOSIS:   Severe Malnutrition related to chronic illness (CHF) as evidenced by moderate fat depletion, severe fat depletion, moderate muscle depletion, severe muscle depletion.  Ongoing  GOAL:   Patient will meet greater than or equal to 90% of their needs  Progressing   MONITOR:   PO intake, Supplement acceptance  REASON FOR ASSESSMENT:   Consult Assessment of nutrition requirement/status  ASSESSMENT:   Pt with medical history significant of HFrEF with last EF of 30-35% (2021), facial basal cell carcinoma s/p radiation (2025), atrial fibrillation, CAD s/p DES to mid LAD (2021), venous insufficiency, failure to thrive, who presents due to leg swelling  Reviewed I/O's: -440 ml x 24 hours and -302 ml since admission  UOP: 800 ml x 24 hours   Per CWOCN notes, pt with bilateral venous stasis ulcers on legs.   Pt sleeping soundly at time of visit. No family present. Noted telesitter present.   Pt is intake has improved since last visit. Noted meal completions 50-90% on a regular diet. Pt also taking Ensure supplements.   No new wt since last visit.   Palliative care following for goals of care discussions.   Per TOC notes, plan for SNF placement once medically stable.   Medications reviewed and include lovenox , ferrous sulfate , lasix , and neurontin .  Labs reviewed.   Diet Order:   Diet Order             Diet regular Room service appropriate? Yes; Fluid consistency: Thin  Diet effective now                    EDUCATION NEEDS:   Education needs have been addressed  Skin:  Skin Assessment: Skin Integrity Issues: Skin Integrity Issues:: Other (Comment) Other: bilateral venous stasis ulcers to legs  Last BM:  03/22/24 (type 3)  Height:   Ht Readings from Last 1 Encounters:  03/18/24 5\' 6"  (1.676 m)    Weight:   Wt Readings from Last 1 Encounters:  03/18/24 49.8 kg    Ideal Body Weight:  59.1 kg  BMI:  Body mass index is 17.72 kg/m.  Estimated Nutritional Needs:   Kcal:  1750-1950  Protein:  85-100 grams  Fluid:  1.7-1.9 L    Herschel Lords, RD, LDN, CDCES Registered Dietitian III Certified Diabetes Care and Education Specialist If unable to reach this RD, please use "RD Inpatient" group chat on secure chat between hours of 8am-4 pm daily

## 2024-03-23 NOTE — TOC Progression Note (Signed)
 Transition of Care [facility]) - Progression Note    Patient Details  Name: [name] [name] MRN: [id number] Date of Birth: Mar 18, 1935  [facility]) CM/SW Contact  Baird Bombard, RN Phone Number: 03/23/2024, 10:38 AM  Clinical Narrative:    Spoke with patient's sister, [facility] regarding discharge plans. Adriana Hopping stated patient is unable to return home alone. RNCM patient is currently on [facility] since 5/8/20024-07/13/2024. Adriana Hopping was advised patient is not able to receive STR and hospice simultaneously.  Adriana Hopping stated her husband is also in hospice and she is not able to care for them both. She stated patient will eventually need  LTC placement for patient. RNCM advised the facility is able to assist with long term and LTSS from the state  would be required.  Adriana Hopping  was advised she would have to revoke patient's hospie status with [facility]. She was advised if she changed her mind at anypoint and would like patient to receive hospice instead of SNF she may do so. Adriana Hopping was provided the number for [facility] @ [phone].    Expected Discharge Plan: Skilled Nursing Facility Barriers to Discharge: Continued Medical Work up  Expected Discharge Plan and Services     Post Acute Care Choice: Skilled Nursing Facility Living arrangements for the past 2 months: Single Family Home                                       Social Determinants of Health (SDOH) Interventions SDOH Screenings   Food Insecurity: No Food Insecurity (03/17/2024)  Housing: Low Risk  (03/17/2024)  Transportation Needs: No Transportation Needs (03/17/2024)  Utilities: Not At Risk (03/17/2024)  Depression (PHQ2-9): Low Risk  (10/29/2023)  Physical Activity: Insufficiently Active (12/25/2022)  Social Connections: Socially Isolated (03/17/2024)  Stress: Stress Concern Present (12/25/2022)  Tobacco Use: Medium Risk (03/16/2024)    Readmission Risk Interventions     No data to display

## 2024-03-23 NOTE — Progress Notes (Signed)
 PHARMACIST - PHYSICIAN COMMUNICATION  CONCERNING:  Enoxaparin  (Lovenox ) for DVT Prophylaxis    RECOMMENDATION: Patient was prescribed enoxaprin 30mg  q24 hours for VTE prophylaxis.   Filed Weights   03/18/24 0323  Weight: 49.8 kg (109 lb 12.6 oz)    Body mass index is 17.72 kg/m.  Estimated Creatinine Clearance: 31.8 mL/min (by C-G formula based on SCr of 0.98 mg/dL).   Based on [facility] policy patient is candidate for enoxaparin  40mg  every 24 hours based on CrCl >71ml/min and Weight >45kg  DESCRIPTION: Pharmacy has adjusted enoxaparin  dose per [facility] policy.  Patient is now receiving enoxaparin  40 mg every 24 hours.  Julitza Rickles Rodriguez-Guzman PharmD, BCPS 03/23/2024 11:29 AM

## 2024-03-24 ENCOUNTER — Inpatient Hospital Stay

## 2024-03-24 DIAGNOSIS — I5033 Acute on chronic diastolic (congestive) heart failure: Secondary | ICD-10-CM | POA: Diagnosis not present

## 2024-03-24 LAB — CBC WITH DIFFERENTIAL/PLATELET
Abs Immature Granulocytes: 0.04 10*3/uL (ref 0.00–0.07)
Basophils Absolute: 0 10*3/uL (ref 0.0–0.1)
Basophils Relative: 0 %
Eosinophils Absolute: 0.1 10*3/uL (ref 0.0–0.5)
Eosinophils Relative: 1 %
HCT: 27.7 % — ABNORMAL LOW (ref 36.0–46.0)
Hemoglobin: 8.2 g/dL — ABNORMAL LOW (ref 12.0–15.0)
Immature Granulocytes: 1 %
Lymphocytes Relative: 7 %
Lymphs Abs: 0.6 10*3/uL — ABNORMAL LOW (ref 0.7–4.0)
MCH: 27.5 pg (ref 26.0–34.0)
MCHC: 29.6 g/dL — ABNORMAL LOW (ref 30.0–36.0)
MCV: 93 fL (ref 80.0–100.0)
Monocytes Absolute: 0.7 10*3/uL (ref 0.1–1.0)
Monocytes Relative: 8 %
Neutro Abs: 7.1 10*3/uL (ref 1.7–7.7)
Neutrophils Relative %: 83 %
Platelets: 282 10*3/uL (ref 150–400)
RBC: 2.98 MIL/uL — ABNORMAL LOW (ref 3.87–5.11)
RDW: 17.8 % — ABNORMAL HIGH (ref 11.5–15.5)
WBC: 8.5 10*3/uL (ref 4.0–10.5)
nRBC: 0 % (ref 0.0–0.2)

## 2024-03-24 LAB — BASIC METABOLIC PANEL WITH GFR
Anion gap: 8 (ref 5–15)
BUN: 41 mg/dL — ABNORMAL HIGH (ref 8–23)
CO2: 31 mmol/L (ref 22–32)
Calcium: 7.9 mg/dL — ABNORMAL LOW (ref 8.9–10.3)
Chloride: 96 mmol/L — ABNORMAL LOW (ref 98–111)
Creatinine, Ser: 0.92 mg/dL (ref 0.44–1.00)
GFR, Estimated: >60 mL/min (ref >60)
Glucose, Bld: 111 mg/dL — ABNORMAL HIGH (ref 70–99)
Potassium: 4.2 mmol/L (ref 3.5–5.1)
Sodium: 135 mmol/L (ref 135–145)

## 2024-03-24 MED ORDER — IPRATROPIUM-ALBUTEROL 0.5-2.5 (3) MG/3ML IN SOLN
3.0000 mL | Freq: Once | RESPIRATORY_TRACT | Status: AC
  Administered 2024-03-24: 3 mL via RESPIRATORY_TRACT
  Filled 2024-03-24: qty 3

## 2024-03-24 MED ORDER — FUROSEMIDE 10 MG/ML IJ SOLN
40.0000 mg | Freq: Every day | INTRAMUSCULAR | Status: DC
  Administered 2024-03-24: 40 mg via INTRAVENOUS
  Filled 2024-03-24: qty 4

## 2024-03-24 NOTE — Progress Notes (Signed)
 PROGRESS NOTE    [name] [name]  WUJ:[id number] DOB: 1935-09-10 DOA: 03/16/2024 PCP: Trenda Frisk, FNP   Assessment & Plan:   Principal Problem:   Acute on chronic HFrEF (heart failure with reduced ejection fraction) (HCC) Active Problems:   AKI (acute kidney injury) (HCC)   Cognitive decline   Normocytic anemia   Protein-calorie malnutrition, severe (HCC)   Hypertension   Atrial fibrillation, chronic (HCC)   Coronary artery disease of native artery of native heart with stable angina pectoris (HCC)  Assessment and Plan: Acute on chronic diastolic CHF: reduced EF/systolic CHF as per echo in 2021. Continue on lasix  & metoprolol . Monitor I/Os. Echo on 03/16/24 shows EF 60-65%, no regional wall motion abnormalities, diastolic function could not be evaluated, mod to severe MR, mod to severe TR.    AKI: resolved   Cognitive decline: continue w/ supportive care. Pt states she is unable to care for herself at home    Iron  deficiency anemia:  continue on iron  supplement. H&H are labile. No need for a transfusion currently    Severe protein calorie malnutrition: continue on nutritional supplements   Hx of CAD: s/p PCI with DES to mid LAD in 2021. Continue on aspirin . No CP currently    Chronic a. fib: continue on metoprolol     HTN: continue on metoprolol        DVT prophylaxis: lovenox   Code Status: DNR Family Communication:  Disposition Plan: likely d/c to SNF   Level of care: Telemetry Cardiac  Status is: Inpatient Remains inpatient appropriate because: severity of illness    Consultants:    Procedures:   Antimicrobials:  Subjective: Pt c/o shortness of breath   Objective: Vitals:   03/23/24 1913 03/23/24 2304 03/24/24 0311 03/24/24 0818  BP: (!) 152/75 131/75 (!) 151/104 (!) 170/91  Pulse: 87 75 72 71  Resp: 18 16 18 18   Temp: 98.1 F (36.7 C) 98.5 F (36.9 C) 98.6 F (37 C) (!) 97.5 F (36.4 C)  TempSrc:  Oral Oral   SpO2: 100% 100% 100% 92%   Weight:      Height:        Intake/Output Summary (Last 24 hours) at 03/24/2024 0825 Last data filed at 03/24/2024 0300 Gross per 24 hour  Intake 360 ml  Output 400 ml  Net -40 ml   Filed Weights   03/18/24 0323  Weight: 49.8 kg    Examination:  General exam: Appears calm and comfortable  Respiratory system: decreased breath sounds b/l  Cardiovascular system: irregularly irregular. No rubs, gallops or clicks.  Gastrointestinal system: Abdomen is nondistended, soft and nontender. Normal bowel sounds heard. Central nervous system: Alert and awake. Moves all extremities Psychiatry: Judgement and insight appears not at baseline. Flat mood and affect    Data Reviewed: I have personally reviewed following labs and imaging studies  CBC: Recent Labs  Lab 03/20/24 0346 03/21/24 0304 03/22/24 0505 03/23/24 0612 03/24/24 0323  WBC 8.6 10.5 11.6* 8.4 8.5  NEUTROABS 7.0 8.8* 9.4* 6.8 7.1  HGB 8.2* 8.4* 8.8* 7.9* 8.2*  HCT 27.1* 28.0* 29.3* 26.5* 27.7*  MCV 88.6 90.3 91.3 91.4 93.0  PLT 252 278 268 261 282   Basic Metabolic Panel: Recent Labs  Lab 03/20/24 0346 03/21/24 0304 03/22/24 0505 03/23/24 0612 03/24/24 0323  NA 137 136 136 136 135  K 4.4 4.5 5.0 4.7 4.2  CL 101 98 99 97* 96*  CO2 29 30 30 30 31   GLUCOSE 99 122* 106* 98 111*  BUN  34* 38* 45* 41* 41*  CREATININE 1.11* 1.06* 1.08* 0.98 0.92  CALCIUM  8.0* 8.1* 8.4* 8.2* 7.9*   GFR: Estimated Creatinine Clearance: 33.9 mL/min (by C-G formula based on SCr of 0.92 mg/dL). Liver Function Tests: No results for input(s): "AST", "ALT", "ALKPHOS", "BILITOT", "PROT", "ALBUMIN" in the last 168 hours. No results for input(s): "LIPASE", "AMYLASE" in the last 168 hours. No results for input(s): "AMMONIA" in the last 168 hours. Coagulation Profile: No results for input(s): "INR", "PROTIME" in the last 168 hours. Cardiac Enzymes: No results for input(s): "CKTOTAL", "CKMB", "CKMBINDEX", "TROPONINI" in the last 168  hours. BNP (last 3 results) No results for input(s): "PROBNP" in the last 8760 hours. HbA1C: No results for input(s): "HGBA1C" in the last 72 hours. CBG: No results for input(s): "GLUCAP" in the last 168 hours. Lipid Profile: No results for input(s): "CHOL", "HDL", "LDLCALC", "TRIG", "CHOLHDL", "LDLDIRECT" in the last 72 hours. Thyroid  Function Tests: No results for input(s): "TSH", "T4TOTAL", "FREET4", "T3FREE", "THYROIDAB" in the last 72 hours. Anemia Panel: No results for input(s): "VITAMINB12", "FOLATE", "FERRITIN", "TIBC", "IRON ", "RETICCTPCT" in the last 72 hours. Sepsis Labs: No results for input(s): "PROCALCITON", "LATICACIDVEN" in the last 168 hours.  No results found for this or any previous visit (from the past 240 hours).       Radiology Studies: No results found.      Scheduled Meds:  aspirin  EC  81 mg Oral Daily   enoxaparin  (LOVENOX ) injection  40 mg Subcutaneous Q24H   feeding supplement  237 mL Oral TID BM   ferrous sulfate   325 mg Oral Daily   furosemide   40 mg Oral Daily   gabapentin   400 mg Oral TID   metoprolol  succinate  25 mg Oral Daily   multivitamin with minerals  1 tablet Oral Daily   sodium chloride  flush  3 mL Intravenous Q12H   terbinafine   250 mg Oral Daily   Continuous Infusions:   LOS: 8 days     Alphonsus Jeans, MD [facility] Pager [phone]  If 7PM-7AM, please contact night-coverage [email or web address] 03/24/2024, 8:25 AM

## 2024-03-24 NOTE — Progress Notes (Signed)
 PIV consult: No current IV meds. Recommend postponing insertion until IV meds are ordered/ needed to preserve veins. Florina Husbands, RN aware.

## 2024-03-24 NOTE — Progress Notes (Signed)
 Physical Therapy Treatment Patient Details Name: [name] [name] MRN: [id number] DOB: 1935-05-14 Today's Date: 03/24/2024   History of Present Illness [name] [name] is a 89 y.o. female with medical history significant of HFrEF with last EF of 30-35% (2021), facial basal cell carcinoma s/p radiation (2025), atrial fibrillation, CAD s/p DES to mid LAD (2021), venous insufficiency, failure to thrive, who presents to the ED due to leg swelling.    PT Comments  Pt progressed with gait distance and decreased assist with bed mobility.  During gait, pt required CGA to Min A intermittently to direct walker around obstacles and stepping backwards to sit 20' with RW. 3LO2, SPO2 85-97%, HR 59-83. Pt reported fatigue and SOB throughout session, reviewed pursed lip breathing. Continued PT will assist pt towards greater dynamic standing balance, LE strengthening, and activity tolerance to increase safety and independence and decrease burden of care with functional mobility.     If plan is discharge home, recommend the following: A lot of help with walking and/or transfers;A lot of help with bathing/dressing/bathroom   Can travel by private vehicle     Yes  Equipment Recommendations  Other (comment) (TBD at next level of care)    Recommendations for Other Services       Precautions / Restrictions Precautions Precautions: Fall Recall of Precautions/Restrictions: Impaired Restrictions Weight Bearing Restrictions Per Provider Order: No     Mobility  Bed Mobility Overal bed mobility: Needs Assistance Bed Mobility: Supine to Sit     Supine to sit: Supervision     General bed mobility comments: needed increased time and bil. UEs to maintain upright trunk.    Transfers Overall transfer level: Needs assistance Equipment used: Rolling walker (2 wheels) Transfers: Sit to/from Stand, Bed to chair/wheelchair/BSC Sit to Stand: Contact guard assist, Min assist   Step pivot transfers: Min assist        General transfer comment: Min A intermittently to direct walker around obstacles and stepping backwards to sit.    Ambulation/Gait Ambulation/Gait assistance: Contact guard assist, Min assist Gait Distance (Feet): 20 Feet Assistive device: Rolling walker (2 wheels) Gait Pattern/deviations: Decreased step length - right, Decreased step length - left, Shuffle, Step-through pattern, Decreased stride length Gait velocity: decreased     General Gait Details: vc's for safety, Min A intermittently to direct walker around obstacles and stepping backwards to sit. 3LO2, SPO2 85-97%, HR 59-83. Pt reported fatigue and SOB, reviewed pursed lip breathing.   Stairs             Wheelchair Mobility     Tilt Bed    Modified Rankin (Stroke Patients Only)       Balance Overall balance assessment: Needs assistance Sitting-balance support: Bilateral upper extremity supported, Feet supported Sitting balance-Leahy Scale: Fair Sitting balance - Comments: Pt had difficulty maintaining upright trunk, reporting fatigue requiring bil. UE support. Postural control: Posterior lean Standing balance support: Bilateral upper extremity supported, Reliant on assistive device for balance Standing balance-Leahy Scale: Fair Standing balance comment: steady static standing with B UE support on RW                            Communication Communication Communication: Impaired Factors Affecting Communication: Hearing impaired  Cognition Arousal: Alert Behavior During Therapy: WFL for tasks assessed/performed   PT - Cognitive impairments: History of cognitive impairments, Awareness, Attention, Problem solving, Safety/Judgement  Following commands: Impaired Following commands impaired: Follows one step commands inconsistently    Cueing Cueing Techniques: Verbal cues  Exercises      General Comments        Pertinent Vitals/Pain Pain Assessment Pain  Assessment: Faces Faces Pain Scale: Hurts a little bit Pain Location: B LEs Pain Descriptors / Indicators: Guarding Pain Intervention(s): Monitored during session    Home Living                          Prior Function            PT Goals (current goals can now be found in the care plan section) Acute Rehab PT Goals Patient Stated Goal: go home PT Goal Formulation: Patient unable to participate in goal setting Time For Goal Achievement: 03/31/24 Progress towards PT goals: Progressing toward goals    Frequency    Min 1X/week      PT Plan      Co-evaluation              AM-PAC PT "6 Clicks" Mobility   Outcome Measure  Help needed turning from your back to your side while in a flat bed without using bedrails?: A Little Help needed moving from lying on your back to sitting on the side of a flat bed without using bedrails?: A Little   Help needed standing up from a chair using your arms (e.g., wheelchair or bedside chair)?: A Little Help needed to walk in hospital room?: A Little Help needed climbing 3-5 steps with a railing? : A Lot 6 Click Score: 14    End of Session Equipment Utilized During Treatment: Gait belt;Oxygen (2-3LO2) Activity Tolerance: Patient tolerated treatment well Patient left: in chair;with call bell/phone within reach;with chair alarm set Nurse Communication: Mobility status PT Visit Diagnosis: Unsteadiness on feet (R26.81);Other abnormalities of gait and mobility (R26.89);Muscle weakness (generalized) (M62.81);Difficulty in walking, not elsewhere classified (R26.2);History of falling (Z91.81)     Time: 1133-1200 PT Time Calculation (min) (ACUTE ONLY): 27 min  Charges:    $Therapeutic Activity: 23-37 mins PT General Charges $$ ACUTE PT VISIT: 1 Visit                     Eliazar Gross, PTA  03/24/24, 12:20 PM

## 2024-03-24 NOTE — Progress Notes (Signed)
 [facility] Liaison Note  03/24/2024  [name] [name] May 20, 1935 [id number]  Location: RN Hospital Liaison screened the patient remotely at [facility].  Insurance: [employer]   [name] [name] is a 89 y.o. female who is a Primary Care Patient of Trenda Frisk, FNP The patient was screened for  readmission hospitalization with noted medium risk score for unplanned readmission risk with 1 IP/2 ED  in 6 months.  The patient was assessed for potential Care Management service needs for post hospital transition for care coordination. Review of patient's electronic medical record reveals patient exertional dyspnea. Pt involved with [facility] home with hospice services. No VBCI  services offered at this time.    VBCI Care Management/Population Health does not replace or interfere with any arrangements made by the Inpatient Transition of Care team.   For questions contact:   Lilla Reichert, RN, BSN Hospital Liaison [place]   [facility], Population Health Office Hours MTWF  8:00 am-6:00 pm Direct Dial: [phone] mobile [email or web address]

## 2024-03-24 NOTE — Evaluation (Signed)
 Clinical/Bedside Swallow Evaluation Patient Details  Name: [name] [name] MRN: [id number] Date of Birth: 07-30-1935  Today's Date: 03/24/2024 Time: SLP Start Time (ACUTE ONLY): 1545 SLP Stop Time (ACUTE ONLY): 1640 SLP Time Calculation (min) (ACUTE ONLY): 55 min  Past Medical History:  Past Medical History:  Diagnosis Date   AKI (acute kidney injury) (HCC) 02/20/2023   Atrial fibrillation (HCC)    Basal cell carcinoma 2021   L temple   Chronic back pain    Congestive heart failure (CHF) (HCC)    History of total hip replacement (Bilateral) 01/12/2020   Left hip replacement done in 2019.  Right hip replacement done in 2012.   Hypertension    Hyponatremia 06/11/2020   Melena 02/21/2023   NSTEMI (non-ST elevated myocardial infarction) (HCC) 06/11/2020   Pharmacologic therapy 01/12/2020   Problems influencing health status 01/12/2020   Scoliosis    Upper GI bleeding 02/21/2023   Past Surgical History:  Past Surgical History:  Procedure Laterality Date   ABDOMINAL SURGERY     bilateral hip replacement     CORONARY STENT INTERVENTION N/A 06/12/2020   Procedure: CORONARY STENT INTERVENTION;  Surgeon: Wenona Hamilton, MD;  Location: [facility]IVE [facility];  Service: Cardiovascular;  Laterality: N/A;  LAD   ESOPHAGOGASTRODUODENOSCOPY (EGD) WITH PROPOFOL  N/A 02/24/2023   Procedure: ESOPHAGOGASTRODUODENOSCOPY (EGD) WITH PROPOFOL ;  Surgeon: Marnee Sink, MD;  Location: [facility] ENDOSCOPY;  Service: Endoscopy;  Laterality: N/A;   LEFT HEART CATH AND CORONARY ANGIOGRAPHY N/A 06/12/2020   Procedure: LEFT HEART CATH AND CORONARY ANGIOGRAPHY;  Surgeon: Wenona Hamilton, MD;  Location: [facility] CV [facility];  Service: Cardiovascular;  Laterality: N/A;   LOWER EXTREMITY ANGIOGRAPHY Right 02/25/2023   Procedure: Lower Extremity Angiography;  Surgeon: Jackquelyn Mass, MD;  Location: [facility];  Service: Cardiovascular;  Laterality: Right;   TONSILLECTOMY     HPI:  Pt is a 89 y.o. female with  medical history significant of HFrEF with last EF of 30-35% (2021), facial basal cell carcinoma s/p radiation (2025), atrial fibrillation, CAD s/p DES to mid LAD (2021), venous insufficiency, Malnutrition and failure to thrive, who presents to the ED due to leg swelling.     Mrs. [name] [name] states that she has been experiencing leg swelling for at least 1 month now, however its become more difficult to get around her home and to breathe.  She notes that earlier today, she was having a hard time getting dressed and called her neighbor for help, who called the ambulance.   Per chart, there is concern that pt is Unable to care for herself at home. Report has been made per chart.    Assessment / Plan / Recommendation  Clinical Impression   Pt seen for BSE today. Pt awake, verbal w/ MOD-SEVERE Confusion in her responses/actions. Pt has a Baseline of Dementia per chart, NSG. NSG reported coughing this morning w/ po attempt post awaking; None noted during Lunch meal. Per chart, there is concern pt is unable to care for herself.  On [place] 2L; afebrile. WBC WNL.  Pt appears to present w/ grossly functional oropharyngeal phase swallowing in setting of declined Cognitive status; Baseline Dementia per chart. Family reports concern of pt being able to care for herself. ANY Cognitive decline can impact overall awareness/timing of swallow and safety during po tasks which increases risk for aspiration, choking. Pt's risk for aspiration can be reduced when following general aspiration precautions and using a slightly modified diet consistency of cut, moist foods for ease  of oral intake overall (pt wears Dentures).  She required min-mod verbal/visual/tactile cues for follow through during po tasks and self-feeding today.        Pt consumed several trials of ice chips, purees, moistened solids, and thin liquids via cup/straw w/ No overt clinical s/s of aspiration noted: no decline in vocal quality, no cough, and no decline in  respiratory status during/post trials. O2 sats 97%. Oral phase was functional for bolus management, mastication, and oral clearing of the boluses given. Mastication of moistened solids appeared adequate given Time. Pt attempted self-feeding but required min-mod support and guidance d/t the Cognitive decline. She was able to feed self which often improves safety of swallowing.  OM Exam was cursory but appeared [facility] w/ No unilateral weakness noted. Some confusion of OM tasks care noted. Visual/tactile/verbal cues helpful to initiate tasks.         In setting of baseline Dementia/Cognitive decline, recommend initiation of the dysphagia level 3/regular foods well-moistened for ease of oral intake; thin liquids. Recommend general aspiration precautions; reduce Distractions during meals and engage pt for self-feeding during meals. Tray setup and sitting up for all oral intake. Pills Whole vs Crushed in Puree for safer swallowing. MD/NSG updated.   ST services recommends follow w/ Palliative Care for GOC and education re: impact of Cognitive decline/Dementia on swallowing and overall oral intake(pt has known Malnutrition). Suspect pt is close to/at her swallowing baseline. No further acute needs indicated. Precautions posted in room, chart. NSG agreed. SLP Visit Diagnosis: Dysphagia, unspecified (R13.10) (Dementia Baseline)    Aspiration Risk  Mild aspiration risk;Risk for inadequate nutrition/hydration (reduced following general aspiration precautions)    Diet Recommendation   Thin;Dysphagia 3 (mechanical soft) (cut/moist foods/meats) = dysphagia level 3/regular foods well-moistened for ease of oral intake; thin liquids. Recommend general aspiration precautions; reduce Distractions during meals and engage pt for self-feeding during meals. Tray setup and sitting up for all oral intake.   Medication Administration: Whole meds with puree (vs CRUSHED in Puree)    Other  Recommendations Recommended Consults:   (Dietician; Palliative Care) Oral Care Recommendations: Oral care BID;Staff/trained caregiver to provide oral care (Denture care)    Recommendations for follow up therapy are one component of a multi-disciplinary discharge planning process, led by the attending physician.  Recommendations may be updated based on patient status, additional functional criteria and insurance authorization.  Follow up Recommendations No SLP follow up      Assistance Recommended at Discharge  FULL at meals  Functional Status Assessment Patient has not had a recent decline in their functional status  Frequency and Duration  (n/a)   (n/a)       Prognosis Prognosis for improved oropharyngeal function: Fair Barriers to Reach Goals: Cognitive deficits;Language deficits;Severity of deficits;Time post onset;Behavior Barriers/Prognosis Comment: Dementia Baseline      Swallow Study   General Date of Onset: 03/16/24 HPI: Pt is a 89 y.o. female with medical history significant of HFrEF with last EF of 30-35% (2021), facial basal cell carcinoma s/p radiation (2025), atrial fibrillation, CAD s/p DES to mid LAD (2021), venous insufficiency, Malnutrition and failure to thrive, who presents to the ED due to leg swelling.     Mrs. Rezabek states that she has been experiencing leg swelling for at least 1 month now, however its become more difficult to get around her home and to breathe.  She notes that earlier today, she was having a hard time getting dressed and called her neighbor for help, who called  the ambulance.   Per chart, there is concern that pt is Unable to care for herself at home. Report has been made per chart. Type of Study: Bedside Swallow Evaluation Previous Swallow Assessment: none Diet Prior to this Study: Regular;Thin liquids (Level 0) Temperature Spikes Noted: No (wbc 8.5) Respiratory Status: Nasal cannula (2L) History of Recent Intubation: No Behavior/Cognition: Alert;Cooperative;Pleasant  mood;Confused;Distractible;Requires cueing (baseline Dementia) Oral Cavity Assessment: Within Functional Limits Oral Care Completed by SLP: Recent completion by staff Oral Cavity - Dentition: Dentures, top;Dentures, bottom (in place) Vision: Functional for self-feeding Self-Feeding Abilities: Able to feed self;Needs assist;Needs set up Patient Positioning: Upright in bed (MOD+ assist) Baseline Vocal Quality: Normal Volitional Cough: Strong;Congested Volitional Swallow: Able to elicit    Oral/Motor/Sensory Function Overall Oral Motor/Sensory Function: Within functional limits   Ice Chips Ice chips: Within functional limits Presentation: Spoon (fed; 1 trial)   Thin Liquid Thin Liquid: Within functional limits Presentation: Cup;Self Fed;Straw (~6 ozs total) Other Comments: water, juice    Nectar Thick Nectar Thick Liquid: Not tested   Honey Thick Honey Thick Liquid: Not tested   Puree Puree: Within functional limits Presentation: Spoon (fed; 9 tirals)   Solid     Solid: Within functional limits (moist) Presentation:  (fed; 9 trials)         Darla Edward, MS, CCC-SLP Speech Language Pathologist Rehab Services; [facility] [place] [phone] (ascom) Coby Antrobus 03/24/2024,6:12 PM

## 2024-03-24 NOTE — Plan of Care (Signed)

## 2024-03-25 DIAGNOSIS — I5033 Acute on chronic diastolic (congestive) heart failure: Secondary | ICD-10-CM | POA: Diagnosis not present

## 2024-03-25 LAB — CBC
HCT: 27.1 % — ABNORMAL LOW (ref 36.0–46.0)
Hemoglobin: 8.1 g/dL — ABNORMAL LOW (ref 12.0–15.0)
MCH: 27.6 pg (ref 26.0–34.0)
MCHC: 29.9 g/dL — ABNORMAL LOW (ref 30.0–36.0)
MCV: 92.5 fL (ref 80.0–100.0)
Platelets: 289 10*3/uL (ref 150–400)
RBC: 2.93 MIL/uL — ABNORMAL LOW (ref 3.87–5.11)
RDW: 18.2 % — ABNORMAL HIGH (ref 11.5–15.5)
WBC: 8.9 10*3/uL (ref 4.0–10.5)
nRBC: 0 % (ref 0.0–0.2)

## 2024-03-25 LAB — BASIC METABOLIC PANEL WITH GFR
Anion gap: 8 (ref 5–15)
BUN: 44 mg/dL — ABNORMAL HIGH (ref 8–23)
CO2: 32 mmol/L (ref 22–32)
Calcium: 8.3 mg/dL — ABNORMAL LOW (ref 8.9–10.3)
Chloride: 96 mmol/L — ABNORMAL LOW (ref 98–111)
Creatinine, Ser: 0.96 mg/dL (ref 0.44–1.00)
GFR, Estimated: 57 mL/min — ABNORMAL LOW (ref >60)
Glucose, Bld: 102 mg/dL — ABNORMAL HIGH (ref 70–99)
Potassium: 4.1 mmol/L (ref 3.5–5.1)
Sodium: 136 mmol/L (ref 135–145)

## 2024-03-25 MED ORDER — FUROSEMIDE 10 MG/ML IJ SOLN
40.0000 mg | Freq: Every day | INTRAMUSCULAR | Status: DC
  Administered 2024-03-25 – 2024-03-27 (×3): 40 mg via INTRAVENOUS
  Filled 2024-03-25 (×3): qty 4

## 2024-03-25 NOTE — Progress Notes (Signed)
 Occupational Therapy Treatment Patient Details Name: [name] [name] DOB: [birth year] Today's Date: 03/25/2024   History of present illness [name] [name] is a 89 y.o. female with medical history significant of HFrEF with last EF of 30-35% (2021), facial basal cell carcinoma s/p radiation (2025), atrial fibrillation, CAD s/p DES to mid LAD (2021), venous insufficiency, failure to thrive, who presents to the ED due to leg swelling.   OT comments  Upon entering the room, pt supine in bed and agreeable to OT intervention with encouragement. Pt having recently returned to bed from [facility] after BM. Pt needing min A to EOB with posterior bias noted in sitting requiring min - mod A for static sitting balance. Pt needing max A to don mesh underwear with sit <>stand from EOB with use of RW. Pt requesting to return to bed with breakfast tray placed in front of her with call bell and all needed items within reach.       If plan is discharge home, recommend the following:  A lot of help with bathing/dressing/bathroom;Assistance with cooking/housework;Assist for transportation;Help with stairs or ramp for entrance;Supervision due to cognitive status;Direct supervision/assist for financial management;Direct supervision/assist for medications management;A little help with walking and/or transfers   Equipment Recommendations  Other (comment) (defer to next venue of care)       Precautions / Restrictions Precautions Precautions: Fall Recall of Precautions/Restrictions: Impaired       Mobility Bed Mobility Overal bed mobility: Needs Assistance Bed Mobility: Sit to Supine, Supine to Sit     Supine to sit: Min assist Sit to supine: Mod assist        Transfers Overall transfer level: Needs assistance Equipment used: Rolling walker (2 wheels) Transfers: Sit to/from Stand Sit to Stand: Min assist                 Balance Overall balance assessment: Needs assistance Sitting-balance  support: Single extremity supported, Feet supported Sitting balance-Leahy Scale: Good   Postural control: Posterior lean Standing balance support: Bilateral upper extremity supported, Reliant on assistive device for balance Standing balance-Leahy Scale: Fair                             ADL either performed or assessed with clinical judgement   ADL Overall ADL's : Needs assistance/impaired                     Lower Body Dressing: Maximal assistance;Sit to/from stand                      Extremity/Trunk Assessment Upper Extremity Assessment Upper Extremity Assessment: Generalized weakness   Lower Extremity Assessment Lower Extremity Assessment: Generalized weakness        Vision Patient Visual Report: No change from baseline           Communication Communication Communication: Impaired Factors Affecting Communication: Hearing impaired   Cognition Arousal: Alert Behavior During Therapy: Anxious, Flat affect Cognition: Cognition impaired   Orientation impairments: Time, Situation, Place Awareness: Intellectual awareness impaired, Online awareness impaired   Attention impairment (select first level of impairment): Sustained attention Executive functioning impairment (select all impairments): Sequencing, Reasoning, Problem solving                   Following commands: Impaired Following commands impaired: Follows one step commands inconsistently      Cueing   Cueing Techniques: Verbal cues  Pertinent Vitals/ Pain       Pain Assessment Pain Assessment: Faces Faces Pain Scale: Hurts a little bit Pain Location: B LEs Pain Descriptors / Indicators: Guarding Pain Intervention(s): Limited activity within patient's tolerance, Monitored during session, Repositioned         Frequency  Min 2X/week        Progress Toward Goals  OT Goals(current goals can now be found in the care plan section)  Progress towards  OT goals: Progressing toward goals      AM-PAC OT "6 Clicks" Daily Activity     Outcome Measure   Help from another person eating meals?: None Help from another person taking care of personal grooming?: A Little Help from another person toileting, which includes using toliet, bedpan, or urinal?: A Lot Help from another person bathing (including washing, rinsing, drying)?: A Lot Help from another person to put on and taking off regular upper body clothing?: A Lot Help from another person to put on and taking off regular lower body clothing?: A Lot 6 Click Score: 15    End of Session Equipment Utilized During Treatment: Rolling walker (2 wheels);Gait belt;Oxygen  OT Visit Diagnosis: Unsteadiness on feet (R26.81);Repeated falls (R29.6);Muscle weakness (generalized) (M62.81)   Activity Tolerance Patient limited by fatigue   Patient Left with call bell/phone within reach;in bed;with bed alarm set   Nurse Communication Mobility status        Time: 1018-1030 OT Time Calculation (min): 12 min  Charges: OT General Charges $OT Visit: 1 Visit OT Treatments $Self Care/Home Management : 8-22 mins  [name] Kinder, MS, OTR/L , CBIS ascom [phone]  03/25/24, 2:49 PM

## 2024-03-25 NOTE — Progress Notes (Signed)
 Mobility Specialist - Progress Note   03/25/24 1500  Mobility  Activity Transferred from bed to chair  Level of Assistance Contact guard assist, steadying assist  Assistive Device Front wheel walker  Distance Ambulated (ft) 3 ft  Activity Response Tolerated well  Mobility visit 1 Mobility     Pt lying in bed upon arrival, utilizing 4L. Pt eating lunch, barely touched, but unhappy with meal and requesting a sandwich. Pt slouched in bed. Agreeable to transfer to chair with min encouragement. Pt completed bed mobility with modA. Posterior bias in sitting. STS and transfer to chair with minA. Pt left in chair with alarm set, needs in reach.    Searcy Czech Mobility Specialist 03/25/24, 3:10 PM

## 2024-03-25 NOTE — Plan of Care (Signed)
  Problem: Education: Goal: Knowledge of General Education information will improve Description: Including pain rating scale, medication(s)/side effects and non-pharmacologic comfort measures 03/25/2024 1640 by Karry Paganini, RN Outcome: Progressing 03/25/2024 1640 by Karry Paganini, RN Outcome: Progressing   Problem: Health Behavior/Discharge Planning: Goal: Ability to manage health-related needs will improve 03/25/2024 1640 by Karry Paganini, RN Outcome: Progressing 03/25/2024 1640 by Karry Paganini, RN Outcome: Progressing   Problem: Clinical Measurements: Goal: Ability to maintain clinical measurements within normal limits will improve 03/25/2024 1640 by Karry Paganini, RN Outcome: Progressing 03/25/2024 1640 by Karry Paganini, RN Outcome: Progressing Goal: Will remain free from infection 03/25/2024 1640 by Karry Paganini, RN Outcome: Progressing 03/25/2024 1640 by Karry Paganini, RN Outcome: Progressing Goal: Diagnostic test results will improve 03/25/2024 1640 by Karry Paganini, RN Outcome: Progressing 03/25/2024 1640 by Karry Paganini, RN Outcome: Progressing Goal: Respiratory complications will improve 03/25/2024 1640 by Karry Paganini, RN Outcome: Progressing 03/25/2024 1640 by Karry Paganini, RN Outcome: Progressing Goal: Cardiovascular complication will be avoided 03/25/2024 1640 by Karry Paganini, RN Outcome: Progressing 03/25/2024 1640 by Karry Paganini, RN Outcome: Progressing   Problem: Activity: Goal: Risk for activity intolerance will decrease 03/25/2024 1640 by Karry Paganini, RN Outcome: Progressing 03/25/2024 1640 by Karry Paganini, RN Outcome: Progressing   Problem: Nutrition: Goal: Adequate nutrition will be maintained 03/25/2024 1640 by Karry Paganini, RN Outcome: Progressing 03/25/2024 1640 by Karry Paganini, RN Outcome: Progressing   Problem: Coping: Goal: Level of anxiety will decrease 03/25/2024 1640 by Karry Paganini, RN Outcome:  Progressing 03/25/2024 1640 by Karry Paganini, RN Outcome: Progressing   Problem: Elimination: Goal: Will not experience complications related to bowel motility 03/25/2024 1640 by Karry Paganini, RN Outcome: Progressing 03/25/2024 1640 by Karry Paganini, RN Outcome: Progressing Goal: Will not experience complications related to urinary retention 03/25/2024 1640 by Karry Paganini, RN Outcome: Progressing 03/25/2024 1640 by Karry Paganini, RN Outcome: Progressing   Problem: Pain Managment: Goal: General experience of comfort will improve and/or be controlled 03/25/2024 1640 by Karry Paganini, RN Outcome: Progressing 03/25/2024 1640 by Karry Paganini, RN Outcome: Progressing   Problem: Safety: Goal: Ability to remain free from injury will improve 03/25/2024 1640 by Karry Paganini, RN Outcome: Progressing 03/25/2024 1640 by Karry Paganini, RN Outcome: Progressing   Problem: Skin Integrity: Goal: Risk for impaired skin integrity will decrease 03/25/2024 1640 by Karry Paganini, RN Outcome: Progressing 03/25/2024 1640 by Karry Paganini, RN Outcome: Progressing

## 2024-03-25 NOTE — Progress Notes (Addendum)
 PROGRESS NOTE    [name] [name]  ZOX:[id number] DOB: 08/24/1935 DOA: 03/16/2024 PCP: Trenda Frisk, FNP   Assessment & Plan:   Principal Problem:   Acute on chronic HFrEF (heart failure with reduced ejection fraction) (HCC) Active Problems:   AKI (acute kidney injury) (HCC)   Cognitive decline   Normocytic anemia   Protein-calorie malnutrition, severe (HCC)   Hypertension   Atrial fibrillation, chronic (HCC)   Coronary artery disease of native artery of native heart with stable angina pectoris (HCC)  Assessment and Plan: Acute on chronic diastolic CHF: reduced EF/systolic CHF as per echo in 2021.  Started back on IV lasix  & continue on metoprolol . Monitor I/Os. Echo on 03/16/24 shows EF 60-65%, no regional wall motion abnormalities, diastolic function could not be evaluated, mod to severe MR, mod to severe TR.    AKI: resolved   Cognitive decline: continue w/ supportive care. Pt states she is unable to care for herself at home    Iron  deficiency anemia: continue on iron  supplement. H&H are labile. Will transfuse if Hb < 7.0   Severe protein calorie malnutrition: continue on nutritional supplements   Hx of CAD: s/p PCI with DES to mid LAD in 2021. Continue on aspirin . No CP currently    Chronic a. fib: continue on metoprolol     HTN: continue on metoprolol        DVT prophylaxis: lovenox   Code Status: DNR Family Communication: discussed pt's care w/ pt's sister, Adriana Hopping, and answered her questions Disposition Plan: likely d/c to SNF   Level of care: Telemetry Cardiac  Status is: Inpatient Remains inpatient appropriate because: severity of illness    Consultants:    Procedures:   Antimicrobials:  Subjective: Pt c/o fatigue   Objective: Vitals:   03/24/24 1941 03/24/24 2110 03/24/24 2328 03/25/24 0326  BP: (!) 155/85  (!) 150/68 124/63  Pulse: 89  92 77  Resp: 18  (!) 22 (!) 22  Temp: (!) 97.5 F (36.4 C)  97.6 F (36.4 C) 97.6 F (36.4 C)  TempSrc:       SpO2: 99% 98% 100% 97%  Weight:      Height:        Intake/Output Summary (Last 24 hours) at 03/25/2024 0845 Last data filed at 03/24/2024 1049 Gross per 24 hour  Intake 240 ml  Output --  Net 240 ml   Filed Weights   03/18/24 0323  Weight: 49.8 kg    Examination:  General exam: Appears comfortable  Respiratory system: diminished breath sounds b/l  Cardiovascular system: irregularly irregular  Gastrointestinal system: abd is soft, NT, ND & hypoactive bowel sounds  Central nervous system: alert & awake. Moves all extremities  Psychiatry: judgement and insight appears at baseline. Frustrated mood and affect     Data Reviewed: I have personally reviewed following labs and imaging studies  CBC: Recent Labs  Lab 03/20/24 0346 03/21/24 0304 03/22/24 0505 03/23/24 0612 03/24/24 0323 03/25/24 0432  WBC 8.6 10.5 11.6* 8.4 8.5 8.9  NEUTROABS 7.0 8.8* 9.4* 6.8 7.1  --   HGB 8.2* 8.4* 8.8* 7.9* 8.2* 8.1*  HCT 27.1* 28.0* 29.3* 26.5* 27.7* 27.1*  MCV 88.6 90.3 91.3 91.4 93.0 92.5  PLT 252 278 268 261 282 289   Basic Metabolic Panel: Recent Labs  Lab 03/21/24 0304 03/22/24 0505 03/23/24 0612 03/24/24 0323 03/25/24 0432  NA 136 136 136 135 136  K 4.5 5.0 4.7 4.2 4.1  CL 98 99 97* 96* 96*  CO2 30 30  30 31 32  GLUCOSE 122* 106* 98 111* 102*  BUN 38* 45* 41* 41* 44*  CREATININE 1.06* 1.08* 0.98 0.92 0.96  CALCIUM  8.1* 8.4* 8.2* 7.9* 8.3*   GFR: Estimated Creatinine Clearance: 32.5 mL/min (by C-G formula based on SCr of 0.96 mg/dL). Liver Function Tests: No results for input(s): "AST", "ALT", "ALKPHOS", "BILITOT", "PROT", "ALBUMIN" in the last 168 hours. No results for input(s): "LIPASE", "AMYLASE" in the last 168 hours. No results for input(s): "AMMONIA" in the last 168 hours. Coagulation Profile: No results for input(s): "INR", "PROTIME" in the last 168 hours. Cardiac Enzymes: No results for input(s): "CKTOTAL", "CKMB", "CKMBINDEX", "TROPONINI" in the last 168  hours. BNP (last 3 results) No results for input(s): "PROBNP" in the last 8760 hours. HbA1C: No results for input(s): "HGBA1C" in the last 72 hours. CBG: No results for input(s): "GLUCAP" in the last 168 hours. Lipid Profile: No results for input(s): "CHOL", "HDL", "LDLCALC", "TRIG", "CHOLHDL", "LDLDIRECT" in the last 72 hours. Thyroid  Function Tests: No results for input(s): "TSH", "T4TOTAL", "FREET4", "T3FREE", "THYROIDAB" in the last 72 hours. Anemia Panel: No results for input(s): "VITAMINB12", "FOLATE", "FERRITIN", "TIBC", "IRON ", "RETICCTPCT" in the last 72 hours. Sepsis Labs: No results for input(s): "PROCALCITON", "LATICACIDVEN" in the last 168 hours.  No results found for this or any previous visit (from the past 240 hours).       Radiology Studies: DG Chest Port 1 View Result Date: 03/24/2024 CLINICAL DATA:  Acute on chronic heart failure EXAM: PORTABLE CHEST 1 VIEW COMPARISON:  X-ray 03/16/2024 FINDINGS: Enlarged cardiopericardial silhouette with calcified aorta. Vascular congestion. Persistent bilateral pleural effusions identified. The right-sided effusion appears slightly smaller. Film is rotated to left. Patient is also tilted to the left. No pneumothorax. Degenerative changes of the spine and shoulders. Osteopenia. IMPRESSION: Enlarged heart with calcified aorta.  Vascular congestion. Bilateral pleural effusions and opacities. Slightly decreased on the right from previous. Rotated and tilted radiograph Electronically Signed   By: Adrianna Horde M.D.   On: 03/24/2024 18:22        Scheduled Meds:  aspirin  EC  81 mg Oral Daily   enoxaparin  (LOVENOX ) injection  40 mg Subcutaneous Q24H   feeding supplement  237 mL Oral TID BM   ferrous sulfate   325 mg Oral Daily   furosemide   40 mg Intravenous Daily   gabapentin   400 mg Oral TID   metoprolol  succinate  25 mg Oral Daily   multivitamin with minerals  1 tablet Oral Daily   sodium chloride  flush  3 mL Intravenous Q12H    terbinafine   250 mg Oral Daily   Continuous Infusions:   LOS: 9 days     Alphonsus Jeans, MD [facility] Pager [phone]  If 7PM-7AM, please contact night-coverage [email or web address] 03/25/2024, 8:45 AM

## 2024-03-25 NOTE — Progress Notes (Signed)
 Physical Therapy Treatment Patient Details Name: [name] [name] MRN: [id number] DOB: December 12, 1934 Today's Date: 03/25/2024   History of Present Illness [name] [name] is a 89 y.o. female with medical history significant of HFrEF with last EF of 30-35% (2021), facial basal cell carcinoma s/p radiation (2025), atrial fibrillation, CAD s/p DES to mid LAD (2021), venous insufficiency, failure to thrive, who presents to the ED due to leg swelling.    PT Comments  Overall, pt demonstrates slow progress with activity tolerance ambulating 30'x2 with RW at CGA level. Pt requires cues for safety with transfers (CGA-Supervision) and gait with RW.  Pt continues to be limited reporting fatigue and SOB requiring seated rest breaks between tasks. DC recs remain appropriate to maximize her independence and safety with all mobility.      If plan is discharge home, recommend the following: A lot of help with walking and/or transfers;A lot of help with bathing/dressing/bathroom   Can travel by private vehicle     Yes  Equipment Recommendations       Recommendations for Other Services       Precautions / Restrictions Precautions Precautions: Fall Recall of Precautions/Restrictions: Impaired Restrictions Weight Bearing Restrictions Per Provider Order: No     Mobility  Bed Mobility Overal bed mobility: Needs Assistance Bed Mobility: Sit to Supine     Supine to sit: Supervision     General bed mobility comments: needed increased time.    Transfers Overall transfer level: Needs assistance Equipment used: Rolling walker (2 wheels) Transfers: Sit to/from Stand, Bed to chair/wheelchair/BSC Sit to Stand: Contact guard assist, Supervision   Step pivot transfers: Contact guard assist       General transfer comment: Min cues manage walker around obstacles and stepping backwards to sit.    Ambulation/Gait Ambulation/Gait assistance: Contact guard assist Gait Distance (Feet): 30 Feet  (x2) Assistive device: Rolling walker (2 wheels) Gait Pattern/deviations: Decreased step length - right, Decreased step length - left, Shuffle, Step-through pattern, Decreased stride length Gait velocity: decreased     General Gait Details: vc's for safety and direct walker around obstacles and stepping backwards to sit. 4LO2,difficulty getting SPO2 reading, RN aware.  Seated rest break between walks with pts DOE going from 4/5 to 2/5 with rest.   Stairs             Wheelchair Mobility     Tilt Bed    Modified Rankin (Stroke Patients Only)       Balance Overall balance assessment: Needs assistance Sitting-balance support: Single extremity supported, Feet supported Sitting balance-Leahy Scale: Good Sitting balance - Comments: posterior lean with fatigue but pt able to self correct with vcs. Postural control: Posterior lean Standing balance support: Bilateral upper extremity supported, Reliant on assistive device for balance Standing balance-Leahy Scale: Fair Standing balance comment: dynamic standing with B UE support on RW, supervision to CGA.                            Communication Communication Communication: Impaired Factors Affecting Communication: Hearing impaired  Cognition Arousal: Alert Behavior During Therapy: WFL for tasks assessed/performed   PT - Cognitive impairments: History of cognitive impairments, Awareness, Attention, Problem solving, Safety/Judgement                         Following commands: Impaired Following commands impaired: Follows one step commands inconsistently    Cueing Cueing Techniques: Verbal cues  Exercises  General Comments        Pertinent Vitals/Pain Pain Assessment Pain Assessment: No/denies pain    Home Living                          Prior Function            PT Goals (current goals can now be found in the care plan section) Acute Rehab PT Goals PT Goal Formulation:  Patient unable to participate in goal setting Time For Goal Achievement: 03/31/24 Progress towards PT goals: Progressing toward goals    Frequency    Min 1X/week      PT Plan      Co-evaluation              AM-PAC PT "6 Clicks" Mobility   Outcome Measure  Help needed turning from your back to your side while in a flat bed without using bedrails?: A Little Help needed moving from lying on your back to sitting on the side of a flat bed without using bedrails?: A Little Help needed moving to and from a bed to a chair (including a wheelchair)?: A Little Help needed standing up from a chair using your arms (e.g., wheelchair or bedside chair)?: A Little Help needed to walk in hospital room?: A Little Help needed climbing 3-5 steps with a railing? : A Lot 6 Click Score: 17    End of Session Equipment Utilized During Treatment: Gait belt;Oxygen (3-4 LO2) Activity Tolerance: Patient limited by fatigue Patient left: in bed;with call bell/phone within reach Nurse Communication: Mobility status PT Visit Diagnosis: Unsteadiness on feet (R26.81);Other abnormalities of gait and mobility (R26.89);Muscle weakness (generalized) (M62.81);Difficulty in walking, not elsewhere classified (R26.2);History of falling (Z91.81)     Time: 2956-2130 PT Time Calculation (min) (ACUTE ONLY): 25 min  Charges:    $Therapeutic Activity: 23-37 mins PT General Charges $$ ACUTE PT VISIT: 1 Visit                     Eliazar Gross, PTA  03/25/24, 12:39 PM

## 2024-03-25 NOTE — Plan of Care (Signed)

## 2024-03-26 DIAGNOSIS — I5033 Acute on chronic diastolic (congestive) heart failure: Secondary | ICD-10-CM | POA: Diagnosis not present

## 2024-03-26 LAB — BASIC METABOLIC PANEL WITH GFR
Anion gap: 9 (ref 5–15)
BUN: 48 mg/dL — ABNORMAL HIGH (ref 8–23)
CO2: 33 mmol/L — ABNORMAL HIGH (ref 22–32)
Calcium: 8.4 mg/dL — ABNORMAL LOW (ref 8.9–10.3)
Chloride: 93 mmol/L — ABNORMAL LOW (ref 98–111)
Creatinine, Ser: 0.96 mg/dL (ref 0.44–1.00)
GFR, Estimated: 57 mL/min — ABNORMAL LOW (ref >60)
Glucose, Bld: 104 mg/dL — ABNORMAL HIGH (ref 70–99)
Potassium: 4.9 mmol/L (ref 3.5–5.1)
Sodium: 135 mmol/L (ref 135–145)

## 2024-03-26 LAB — CBC
HCT: 28.2 % — ABNORMAL LOW (ref 36.0–46.0)
Hemoglobin: 8.2 g/dL — ABNORMAL LOW (ref 12.0–15.0)
MCH: 27.2 pg (ref 26.0–34.0)
MCHC: 29.1 g/dL — ABNORMAL LOW (ref 30.0–36.0)
MCV: 93.7 fL (ref 80.0–100.0)
Platelets: 311 10*3/uL (ref 150–400)
RBC: 3.01 MIL/uL — ABNORMAL LOW (ref 3.87–5.11)
RDW: 18.5 % — ABNORMAL HIGH (ref 11.5–15.5)
WBC: 7.9 10*3/uL (ref 4.0–10.5)
nRBC: 0 % (ref 0.0–0.2)

## 2024-03-26 MED ORDER — TRAZODONE HCL 50 MG PO TABS
25.0000 mg | ORAL_TABLET | Freq: Every evening | ORAL | Status: AC | PRN
  Administered 2024-03-26 – 2024-03-27 (×2): 25 mg via ORAL
  Filled 2024-03-26 (×2): qty 1

## 2024-03-26 NOTE — Consult Note (Addendum)
 WOC Nurse wound follow up Wound type: Chronic venous ulcers - Bilateral Unna boot Measurement: Left leg - 2 ulcers aprox. 1 cm x 1 cm, 100% red. Top of the upper leg. 1 ulcer on the malleolar posterior, 80% red, 20% yellow. One wound 0.5 x 0.5, 100% yellow on heel. Drainage (amount, consistency, odor) Scant amout. No odor, serosanguinous. Periwound: intact. Dressing procedure/placement/frequency: Cleanse with saline and gauze. Apply Mepitel on the recent healed tissue, Xeroform on the wound bed. Wrapped the leg with Unna boot, Kerlix and Coban. Change twice a week.   Measurement: Right leg - 2 ulcers 100% pink granulation with partial epithelization, aprox. 1 cm x 1 cm. (2 Below the knee, 1 midline tibial) Malleolar anterior 1.5 cm x 1.5 cm 100% red Drainage (amount, consistency, odor) Scant amout. No odor. Periwound: intact. Dressing procedure/placement/frequency: Cleanse with saline and gauze. Apply Mepitel on the recent healed tissue, Xeroform on the wound bed. Wrapped the leg with Unna boot, Kerlix and Coban. Change twice a week.   WOC team will change her unna boot on TUE.   Please reconsult if further assistance is needed. Thank-you,  Rachel Budds BSN, RN, [employer], WOC  (Pager: [phone])

## 2024-03-26 NOTE — TOC Progression Note (Signed)
 Transition of Care [facility]) - Progression Note    Patient Details  Name: [name] [name] MRN: [id number] Date of Birth: 01/03/1935  [facility]) CM/SW Contact  Baird Bombard, RN Phone Number: 03/26/2024, 12:26 PM  Clinical Narrative:    Spoke with patient's sister [facility] regarding hospice status. She stated she has not had a chance to call and revoke status for patient with hospice. She was advised discharge to SNF can not proceed until status with hospice is revoked.    Expected Discharge Plan: Skilled Nursing Facility Barriers to Discharge: Continued Medical Work up  Expected Discharge Plan and Services     Post Acute Care Choice: Skilled Nursing Facility Living arrangements for the past 2 months: Single Family Home                                       Social Determinants of Health (SDOH) Interventions SDOH Screenings   Food Insecurity: No Food Insecurity (03/17/2024)  Housing: Low Risk  (03/17/2024)  Transportation Needs: No Transportation Needs (03/17/2024)  Utilities: Not At Risk (03/17/2024)  Depression (PHQ2-9): Low Risk  (10/29/2023)  Physical Activity: Insufficiently Active (12/25/2022)  Social Connections: Socially Isolated (03/17/2024)  Stress: Stress Concern Present (12/25/2022)  Tobacco Use: Medium Risk (03/16/2024)    Readmission Risk Interventions     No data to display

## 2024-03-26 NOTE — Plan of Care (Signed)
  Problem: Clinical Measurements: Goal: Will remain free from infection Outcome: Progressing   Problem: Activity: Goal: Risk for activity intolerance will decrease Outcome: Progressing   Problem: Nutrition: Goal: Adequate nutrition will be maintained Outcome: Progressing   Problem: Coping: Goal: Level of anxiety will decrease Outcome: Progressing   

## 2024-03-26 NOTE — Plan of Care (Signed)

## 2024-03-26 NOTE — Progress Notes (Signed)
 PROGRESS NOTE    [name] [name]  ZOX:[id number] DOB: 1934/11/25 DOA: 03/16/2024 PCP: Trenda Frisk, FNP   Assessment & Plan:   Principal Problem:   Acute on chronic HFrEF (heart failure with reduced ejection fraction) (HCC) Active Problems:   AKI (acute kidney injury) (HCC)   Cognitive decline   Normocytic anemia   Protein-calorie malnutrition, severe (HCC)   Hypertension   Atrial fibrillation, chronic (HCC)   Coronary artery disease of native artery of native heart with stable angina pectoris (HCC)  Assessment and Plan: Acute on chronic diastolic CHF: reduced EF/systolic CHF as per echo in 2021.  Continue on IV lasix  & continue on metoprolol . Monitor I/Os. Echo on 03/16/24 shows EF 60-65%, no regional wall motion abnormalities, diastolic function could not be evaluated, mod to severe MR, mod to severe TR.    AKI: resolved   Cognitive decline: continue w/ supportive care. Pt states she is unable to care for herself at home    Iron  deficiency anemia: continue on iron  supplement. H&H are labile   Severe protein calorie malnutrition: continue w/ nutritional supplements  Hx of CAD: s/p PCI with DES to mid LAD in 2021. Continue on aspirin . No CP currently    Chronic a. fib: continue on metoprolol     HTN: continue on metoprolol        DVT prophylaxis: lovenox   Code Status: DNR Family Communication:  Disposition Plan: likely d/c to SNF   Level of care: Telemetry Cardiac  Status is: Inpatient Remains inpatient appropriate because: medically stable. Pt's sister has to revoke hospice for pt to d/c to SNF and she & pt are aware    Consultants:    Procedures:   Antimicrobials:  Subjective: Pt c/o malaise  Objective: Vitals:   03/25/24 2051 03/26/24 0105 03/26/24 0444 03/26/24 0745  BP: 131/67 131/72 (!) 157/88 (!) 155/81  Pulse: 96 87 96 (!) 108  Resp: 20 16 16 18   Temp: (!) 97.5 F (36.4 C)  97.7 F (36.5 C) 97.9 F (36.6 C)  TempSrc:      SpO2: 100% 99%  100% 91%  Weight:      Height:       No intake or output data in the 24 hours ending 03/26/24 0909  Filed Weights   03/18/24 0323  Weight: 49.8 kg    Examination:  General exam: Appears comfortable. Frail appearing  Respiratory system: decreased breath sounds b/l  Cardiovascular system: irregularly irregular Gastrointestinal system: abd is soft, NT, ND & hypoactive bowel sounds Central nervous system: alert & awake. Moves all extremities  Psychiatry: judgement and insight appears at baseline. Flat mood and affect     Data Reviewed: I have personally reviewed following labs and imaging studies  CBC: Recent Labs  Lab 03/20/24 0346 03/21/24 0304 03/22/24 0505 03/23/24 0612 03/24/24 0323 03/25/24 0432 03/26/24 0501  WBC 8.6 10.5 11.6* 8.4 8.5 8.9 7.9  NEUTROABS 7.0 8.8* 9.4* 6.8 7.1  --   --   HGB 8.2* 8.4* 8.8* 7.9* 8.2* 8.1* 8.2*  HCT 27.1* 28.0* 29.3* 26.5* 27.7* 27.1* 28.2*  MCV 88.6 90.3 91.3 91.4 93.0 92.5 93.7  PLT 252 278 268 261 282 289 311   Basic Metabolic Panel: Recent Labs  Lab 03/22/24 0505 03/23/24 0612 03/24/24 0323 03/25/24 0432 03/26/24 0501  NA 136 136 135 136 135  K 5.0 4.7 4.2 4.1 4.9  CL 99 97* 96* 96* 93*  CO2 30 30 31  32 33*  GLUCOSE 106* 98 111* 102* 104*  BUN  45* 41* 41* 44* 48*  CREATININE 1.08* 0.98 0.92 0.96 0.96  CALCIUM  8.4* 8.2* 7.9* 8.3* 8.4*   GFR: Estimated Creatinine Clearance: 32.5 mL/min (by C-G formula based on SCr of 0.96 mg/dL). Liver Function Tests: No results for input(s): "AST", "ALT", "ALKPHOS", "BILITOT", "PROT", "ALBUMIN" in the last 168 hours. No results for input(s): "LIPASE", "AMYLASE" in the last 168 hours. No results for input(s): "AMMONIA" in the last 168 hours. Coagulation Profile: No results for input(s): "INR", "PROTIME" in the last 168 hours. Cardiac Enzymes: No results for input(s): "CKTOTAL", "CKMB", "CKMBINDEX", "TROPONINI" in the last 168 hours. BNP (last 3 results) No results for input(s):  "PROBNP" in the last 8760 hours. HbA1C: No results for input(s): "HGBA1C" in the last 72 hours. CBG: No results for input(s): "GLUCAP" in the last 168 hours. Lipid Profile: No results for input(s): "CHOL", "HDL", "LDLCALC", "TRIG", "CHOLHDL", "LDLDIRECT" in the last 72 hours. Thyroid  Function Tests: No results for input(s): "TSH", "T4TOTAL", "FREET4", "T3FREE", "THYROIDAB" in the last 72 hours. Anemia Panel: No results for input(s): "VITAMINB12", "FOLATE", "FERRITIN", "TIBC", "IRON ", "RETICCTPCT" in the last 72 hours. Sepsis Labs: No results for input(s): "PROCALCITON", "LATICACIDVEN" in the last 168 hours.  No results found for this or any previous visit (from the past 240 hours).       Radiology Studies: DG Chest Port 1 View Result Date: 03/24/2024 CLINICAL DATA:  Acute on chronic heart failure EXAM: PORTABLE CHEST 1 VIEW COMPARISON:  X-ray 03/16/2024 FINDINGS: Enlarged cardiopericardial silhouette with calcified aorta. Vascular congestion. Persistent bilateral pleural effusions identified. The right-sided effusion appears slightly smaller. Film is rotated to left. Patient is also tilted to the left. No pneumothorax. Degenerative changes of the spine and shoulders. Osteopenia. IMPRESSION: Enlarged heart with calcified aorta.  Vascular congestion. Bilateral pleural effusions and opacities. Slightly decreased on the right from previous. Rotated and tilted radiograph Electronically Signed   By: Adrianna Horde M.D.   On: 03/24/2024 18:22        Scheduled Meds:  aspirin  EC  81 mg Oral Daily   enoxaparin  (LOVENOX ) injection  40 mg Subcutaneous Q24H   feeding supplement  237 mL Oral TID BM   ferrous sulfate   325 mg Oral Daily   furosemide   40 mg Intravenous Daily   gabapentin   400 mg Oral TID   metoprolol  succinate  25 mg Oral Daily   multivitamin with minerals  1 tablet Oral Daily   sodium chloride  flush  3 mL Intravenous Q12H   terbinafine   250 mg Oral Daily   Continuous  Infusions:   LOS: 10 days     Alphonsus Jeans, MD [facility] Pager [phone]  If 7PM-7AM, please contact night-coverage [email or web address] 03/26/2024, 9:09 AM

## 2024-03-27 DIAGNOSIS — I5033 Acute on chronic diastolic (congestive) heart failure: Secondary | ICD-10-CM | POA: Diagnosis not present

## 2024-03-27 LAB — CBC
HCT: 28.5 % — ABNORMAL LOW (ref 36.0–46.0)
Hemoglobin: 8.5 g/dL — ABNORMAL LOW (ref 12.0–15.0)
MCH: 27.2 pg (ref 26.0–34.0)
MCHC: 29.8 g/dL — ABNORMAL LOW (ref 30.0–36.0)
MCV: 91.3 fL (ref 80.0–100.0)
Platelets: 325 10*3/uL (ref 150–400)
RBC: 3.12 MIL/uL — ABNORMAL LOW (ref 3.87–5.11)
RDW: 18.6 % — ABNORMAL HIGH (ref 11.5–15.5)
WBC: 7.7 10*3/uL (ref 4.0–10.5)
nRBC: 0 % (ref 0.0–0.2)

## 2024-03-27 LAB — BASIC METABOLIC PANEL WITH GFR
Anion gap: 9 (ref 5–15)
BUN: 48 mg/dL — ABNORMAL HIGH (ref 8–23)
CO2: 31 mmol/L (ref 22–32)
Calcium: 8.3 mg/dL — ABNORMAL LOW (ref 8.9–10.3)
Chloride: 94 mmol/L — ABNORMAL LOW (ref 98–111)
Creatinine, Ser: 0.86 mg/dL (ref 0.44–1.00)
GFR, Estimated: >60 mL/min (ref >60)
Glucose, Bld: 107 mg/dL — ABNORMAL HIGH (ref 70–99)
Potassium: 4.3 mmol/L (ref 3.5–5.1)
Sodium: 134 mmol/L — ABNORMAL LOW (ref 135–145)

## 2024-03-27 MED ORDER — HYDROXYZINE HCL 50 MG PO TABS
25.0000 mg | ORAL_TABLET | Freq: Three times a day (TID) | ORAL | Status: DC | PRN
  Administered 2024-03-27 – 2024-04-01 (×5): 25 mg via ORAL
  Filled 2024-03-27 (×5): qty 1

## 2024-03-27 NOTE — Plan of Care (Signed)

## 2024-03-27 NOTE — Progress Notes (Signed)
 PROGRESS NOTE    [name] [name]  ZOX:[id number] DOB: February 23, 1935 DOA: 03/16/2024 PCP: Trenda Frisk, FNP   Assessment & Plan:   Principal Problem:   Acute on chronic HFrEF (heart failure with reduced ejection fraction) (HCC) Active Problems:   AKI (acute kidney injury) (HCC)   Cognitive decline   Normocytic anemia   Protein-calorie malnutrition, severe (HCC)   Hypertension   Atrial fibrillation, chronic (HCC)   Coronary artery disease of native artery of native heart with stable angina pectoris (HCC)  Assessment and Plan: Acute on chronic diastolic CHF: reduced EF/systolic CHF as per echo in 2021.  Continue on IV lasix  (will try transition back to po lasix  in AM) & metoprolol . Monitor I/Os. Echo on 03/16/24 shows EF 60-65%, no regional wall motion abnormalities, diastolic function could not be evaluated, mod to severe MR, mod to severe TR.    AKI: resolved   Cognitive decline: continue w/ supportive care. Pt states she is unable to care for herself at home    Iron  deficiency anemia: continue on iron  supplement. H&H are stable    Severe protein calorie malnutrition: continue w/ nutritional supplements   Hx of CAD: s/p PCI with DES to mid LAD in 2021.Continue on aspirin    Chronic a. fib: continue on metoprolol . High fall risk    HTN: continue on metoprolol       DVT prophylaxis: lovenox   Code Status: DNR Family Communication:  Disposition Plan: likely d/c to SNF   Level of care: Telemetry Cardiac  Status is: Inpatient Remains inpatient appropriate because: medically stable. Pt's sister has to revoke hospice for pt to d/c to SNF and she & pt are aware    Consultants:    Procedures:   Antimicrobials:  Subjective: Pt c/o fatigue   Objective: Vitals:   03/26/24 1935 03/26/24 2323 03/27/24 0620 03/27/24 0808  BP: (!) 142/79 (!) 146/97 (!) 145/80 (!) 143/77  Pulse: 86 83 65 77  Resp:   14 18  Temp: 97.8 F (36.6 C)  98 F (36.7 C) 97.8 F (36.6 C)   TempSrc: Oral  Oral   SpO2: 93% 100% 93% 100%  Weight:      Height:        Intake/Output Summary (Last 24 hours) at 03/27/2024 0845 Last data filed at 03/26/2024 2300 Gross per 24 hour  Intake 240 ml  Output 850 ml  Net -610 ml    Filed Weights   03/18/24 0323  Weight: 49.8 kg    Examination:  General exam: Appears comfortable. Frail appearing  Respiratory system: diminished breath sounds b/l  Cardiovascular system: irregularly irregular  Gastrointestinal system: abd is soft, NT, ND & hypoactive bowel sounds  Central nervous system: alert & awake. Moves all extremities  Psychiatry: judgement and insight appears at baseline. Flat mood and affect     Data Reviewed: I have personally reviewed following labs and imaging studies  CBC: Recent Labs  Lab 03/21/24 0304 03/22/24 0505 03/23/24 0612 03/24/24 0323 03/25/24 0432 03/26/24 0501 03/27/24 0622  WBC 10.5 11.6* 8.4 8.5 8.9 7.9 7.7  NEUTROABS 8.8* 9.4* 6.8 7.1  --   --   --   HGB 8.4* 8.8* 7.9* 8.2* 8.1* 8.2* 8.5*  HCT 28.0* 29.3* 26.5* 27.7* 27.1* 28.2* 28.5*  MCV 90.3 91.3 91.4 93.0 92.5 93.7 91.3  PLT 278 268 261 282 289 311 325   Basic Metabolic Panel: Recent Labs  Lab 03/23/24 0612 03/24/24 0323 03/25/24 0432 03/26/24 0501 03/27/24 0622  NA 136 135 136  135 134*  K 4.7 4.2 4.1 4.9 4.3  CL 97* 96* 96* 93* 94*  CO2 30 31 32 33* 31  GLUCOSE 98 111* 102* 104* 107*  BUN 41* 41* 44* 48* 48*  CREATININE 0.98 0.92 0.96 0.96 0.86  CALCIUM  8.2* 7.9* 8.3* 8.4* 8.3*   GFR: Estimated Creatinine Clearance: 36.2 mL/min (by C-G formula based on SCr of 0.86 mg/dL). Liver Function Tests: No results for input(s): "AST", "ALT", "ALKPHOS", "BILITOT", "PROT", "ALBUMIN" in the last 168 hours. No results for input(s): "LIPASE", "AMYLASE" in the last 168 hours. No results for input(s): "AMMONIA" in the last 168 hours. Coagulation Profile: No results for input(s): "INR", "PROTIME" in the last 168 hours. Cardiac  Enzymes: No results for input(s): "CKTOTAL", "CKMB", "CKMBINDEX", "TROPONINI" in the last 168 hours. BNP (last 3 results) No results for input(s): "PROBNP" in the last 8760 hours. HbA1C: No results for input(s): "HGBA1C" in the last 72 hours. CBG: No results for input(s): "GLUCAP" in the last 168 hours. Lipid Profile: No results for input(s): "CHOL", "HDL", "LDLCALC", "TRIG", "CHOLHDL", "LDLDIRECT" in the last 72 hours. Thyroid  Function Tests: No results for input(s): "TSH", "T4TOTAL", "FREET4", "T3FREE", "THYROIDAB" in the last 72 hours. Anemia Panel: No results for input(s): "VITAMINB12", "FOLATE", "FERRITIN", "TIBC", "IRON ", "RETICCTPCT" in the last 72 hours. Sepsis Labs: No results for input(s): "PROCALCITON", "LATICACIDVEN" in the last 168 hours.  No results found for this or any previous visit (from the past 240 hours).       Radiology Studies: No results found.       Scheduled Meds:  aspirin  EC  81 mg Oral Daily   enoxaparin  (LOVENOX ) injection  40 mg Subcutaneous Q24H   feeding supplement  237 mL Oral TID BM   ferrous sulfate   325 mg Oral Daily   furosemide   40 mg Intravenous Daily   gabapentin   400 mg Oral TID   metoprolol  succinate  25 mg Oral Daily   multivitamin with minerals  1 tablet Oral Daily   sodium chloride  flush  3 mL Intravenous Q12H   terbinafine   250 mg Oral Daily   Continuous Infusions:   LOS: 11 days     Alphonsus Jeans, MD [facility] Pager [phone]  If 7PM-7AM, please contact night-coverage [email or web address] 03/27/2024, 8:45 AM

## 2024-03-28 DIAGNOSIS — I5023 Acute on chronic systolic (congestive) heart failure: Secondary | ICD-10-CM | POA: Diagnosis not present

## 2024-03-28 LAB — CBC
HCT: 28.8 % — ABNORMAL LOW (ref 36.0–46.0)
Hemoglobin: 8.7 g/dL — ABNORMAL LOW (ref 12.0–15.0)
MCH: 28 pg (ref 26.0–34.0)
MCHC: 30.2 g/dL (ref 30.0–36.0)
MCV: 92.6 fL (ref 80.0–100.0)
Platelets: 324 10*3/uL (ref 150–400)
RBC: 3.11 MIL/uL — ABNORMAL LOW (ref 3.87–5.11)
RDW: 18.6 % — ABNORMAL HIGH (ref 11.5–15.5)
WBC: 6.2 10*3/uL (ref 4.0–10.5)
nRBC: 0 % (ref 0.0–0.2)

## 2024-03-28 LAB — BASIC METABOLIC PANEL WITH GFR
Anion gap: 10 (ref 5–15)
BUN: 56 mg/dL — ABNORMAL HIGH (ref 8–23)
CO2: 30 mmol/L (ref 22–32)
Calcium: 8.2 mg/dL — ABNORMAL LOW (ref 8.9–10.3)
Chloride: 94 mmol/L — ABNORMAL LOW (ref 98–111)
Creatinine, Ser: 1.05 mg/dL — ABNORMAL HIGH (ref 0.44–1.00)
GFR, Estimated: 51 mL/min — ABNORMAL LOW (ref >60)
Glucose, Bld: 97 mg/dL (ref 70–99)
Potassium: 4.5 mmol/L (ref 3.5–5.1)
Sodium: 134 mmol/L — ABNORMAL LOW (ref 135–145)

## 2024-03-28 MED ORDER — FUROSEMIDE 40 MG PO TABS
40.0000 mg | ORAL_TABLET | Freq: Every day | ORAL | Status: DC
  Administered 2024-03-28 – 2024-04-02 (×6): 40 mg via ORAL
  Filled 2024-03-28 (×6): qty 1

## 2024-03-28 NOTE — Plan of Care (Signed)
   Problem: Education: Goal: Knowledge of General Education information will improve Description: Including pain rating scale, medication(s)/side effects and non-pharmacologic comfort measures Outcome: Not Progressing   Problem: Health Behavior/Discharge Planning: Goal: Ability to manage health-related needs will improve Outcome: Not Progressing

## 2024-03-28 NOTE — Plan of Care (Signed)

## 2024-03-28 NOTE — TOC Progression Note (Signed)
 Transition of Care [facility]) - Progression Note    Patient Details  Name: [name] [name] MRN: [id number] Date of Birth: [birth year]  [facility]) CM/SW Contact  Joslyn Nim, RN Phone Number: 03/28/2024, 3:55 PM  Clinical Narrative:    Cm spoke with sister, Demaris Fillers. She states ythat she called hospice. They told her that Hospice was revoked on 03/18/24. Sister did state that [facility] was closer to her.    Expected Discharge Plan: Skilled Nursing Facility Barriers to Discharge: Continued Medical Work up  Expected Discharge Plan and Services     Post Acute Care Choice: Skilled Nursing Facility Living arrangements for the past 2 months: Single Family Home                                       Social Determinants of Health (SDOH) Interventions SDOH Screenings   Food Insecurity: No Food Insecurity (03/17/2024)  Housing: Low Risk  (03/17/2024)  Transportation Needs: No Transportation Needs (03/17/2024)  Utilities: Not At Risk (03/17/2024)  Depression (PHQ2-9): Low Risk  (10/29/2023)  Physical Activity: Insufficiently Active (12/25/2022)  Social Connections: Socially Isolated (03/17/2024)  Stress: Stress Concern Present (12/25/2022)  Tobacco Use: Medium Risk (03/16/2024)    Readmission Risk Interventions     No data to display

## 2024-03-28 NOTE — Progress Notes (Signed)
 PROGRESS NOTE    [name] [name]  ZOX:[id number] DOB: 1935-04-26 DOA: 03/16/2024 PCP: Trenda Frisk, FNP   Assessment & Plan:   Principal Problem:   Acute on chronic HFrEF (heart failure with reduced ejection fraction) (HCC) Active Problems:   AKI (acute kidney injury) (HCC)   Cognitive decline   Normocytic anemia   Protein-calorie malnutrition, severe (HCC)   Hypertension   Atrial fibrillation, chronic (HCC)   Coronary artery disease of native artery of native heart with stable angina pectoris (HCC)  Assessment and Plan: Acute on chronic diastolic CHF: reduced EF/systolic CHF as per echo in 2021. Continue lasix  & metoprolol . Monitor I/Os. Echo on 03/16/24 shows EF 60-65%, no regional wall motion abnormalities, diastolic function could not be evaluated, mod to severe MR, mod to severe TR.    AKI: resolved   Cognitive decline: continue w/ supportive care. Pt states she is unable to care for herself at home    Iron  deficiency anemia: continue on iron  supplement. Will transfuse if Hb < 7.0    Severe protein calorie malnutrition: continue on nutritional supplements   Hx of CAD: s/p PCI with DES to mid LAD in 2021. Continue on aspirin     Chronic a. fib: continue on metoprolol . High fall risk   HTN: continue on metoprolol        DVT prophylaxis: lovenox   Code Status: DNR Family Communication: called pt's sister, Adriana Hopping, but no answer  Disposition Plan: likely d/c to SNF   Level of care: Telemetry Cardiac  Status is: Inpatient Remains inpatient appropriate because: medically stable. Pt's sister has to revoke hospice for pt to d/c to SNF and she & pt are aware    Consultants:    Procedures:   Antimicrobials:  Subjective: Pt c/o malaise   Objective: Vitals:   03/27/24 1738 03/27/24 1955 03/28/24 0015 03/28/24 0513  BP: 134/68 (!) 124/57 109/61 135/83  Pulse: (!) 101 92 82 82  Resp: 18 18 18 18   Temp: 98.1 F (36.7 C) 98.5 F (36.9 C) 97.8 F (36.6 C) 98.2 F  (36.8 C)  TempSrc: Oral   Axillary  SpO2: 99% 95% 92% 92%  Weight:      Height:        Intake/Output Summary (Last 24 hours) at 03/28/2024 0848 Last data filed at 03/27/2024 1919 Gross per 24 hour  Intake 240 ml  Output 250 ml  Net -10 ml    Filed Weights   03/18/24 0323  Weight: 49.8 kg    Examination:  General exam: Appears comfortable. Frail appearing  Respiratory system: decreased breath sounds b/l  Cardiovascular system: irregularly irregular  Gastrointestinal system: abd is soft, NT, ND & hypoactive bowel sounds Central nervous system: alert & awake. Moves all extremities  Psychiatry: judgement and insight appears at baseline. Flat mood and affect     Data Reviewed: I have personally reviewed following labs and imaging studies  CBC: Recent Labs  Lab 03/22/24 0505 03/23/24 0612 03/24/24 0323 03/25/24 0432 03/26/24 0501 03/27/24 0622 03/28/24 0448  WBC 11.6* 8.4 8.5 8.9 7.9 7.7 6.2  NEUTROABS 9.4* 6.8 7.1  --   --   --   --   HGB 8.8* 7.9* 8.2* 8.1* 8.2* 8.5* 8.7*  HCT 29.3* 26.5* 27.7* 27.1* 28.2* 28.5* 28.8*  MCV 91.3 91.4 93.0 92.5 93.7 91.3 92.6  PLT 268 261 282 289 311 325 324   Basic Metabolic Panel: Recent Labs  Lab 03/24/24 0323 03/25/24 0432 03/26/24 0501 03/27/24 0622 03/28/24 0448  NA 135  136 135 134* 134*  K 4.2 4.1 4.9 4.3 4.5  CL 96* 96* 93* 94* 94*  CO2 31 32 33* 31 30  GLUCOSE 111* 102* 104* 107* 97  BUN 41* 44* 48* 48* 56*  CREATININE 0.92 0.96 0.96 0.86 1.05*  CALCIUM  7.9* 8.3* 8.4* 8.3* 8.2*   GFR: Estimated Creatinine Clearance: 29.7 mL/min (A) (by C-G formula based on SCr of 1.05 mg/dL (H)). Liver Function Tests: No results for input(s): "AST", "ALT", "ALKPHOS", "BILITOT", "PROT", "ALBUMIN" in the last 168 hours. No results for input(s): "LIPASE", "AMYLASE" in the last 168 hours. No results for input(s): "AMMONIA" in the last 168 hours. Coagulation Profile: No results for input(s): "INR", "PROTIME" in the last 168  hours. Cardiac Enzymes: No results for input(s): "CKTOTAL", "CKMB", "CKMBINDEX", "TROPONINI" in the last 168 hours. BNP (last 3 results) No results for input(s): "PROBNP" in the last 8760 hours. HbA1C: No results for input(s): "HGBA1C" in the last 72 hours. CBG: No results for input(s): "GLUCAP" in the last 168 hours. Lipid Profile: No results for input(s): "CHOL", "HDL", "LDLCALC", "TRIG", "CHOLHDL", "LDLDIRECT" in the last 72 hours. Thyroid  Function Tests: No results for input(s): "TSH", "T4TOTAL", "FREET4", "T3FREE", "THYROIDAB" in the last 72 hours. Anemia Panel: No results for input(s): "VITAMINB12", "FOLATE", "FERRITIN", "TIBC", "IRON ", "RETICCTPCT" in the last 72 hours. Sepsis Labs: No results for input(s): "PROCALCITON", "LATICACIDVEN" in the last 168 hours.  No results found for this or any previous visit (from the past 240 hours).       Radiology Studies: No results found.       Scheduled Meds:  aspirin  EC  81 mg Oral Daily   enoxaparin  (LOVENOX ) injection  40 mg Subcutaneous Q24H   feeding supplement  237 mL Oral TID BM   ferrous sulfate   325 mg Oral Daily   furosemide   40 mg Oral Daily   gabapentin   400 mg Oral TID   metoprolol  succinate  25 mg Oral Daily   multivitamin with minerals  1 tablet Oral Daily   sodium chloride  flush  3 mL Intravenous Q12H   terbinafine   250 mg Oral Daily   Continuous Infusions:   LOS: 12 days     Alphonsus Jeans, MD [facility] Pager [phone]  If 7PM-7AM, please contact night-coverage [email or web address] 03/28/2024, 8:48 AM

## 2024-03-29 DIAGNOSIS — I5023 Acute on chronic systolic (congestive) heart failure: Secondary | ICD-10-CM | POA: Diagnosis not present

## 2024-03-29 LAB — BASIC METABOLIC PANEL WITH GFR
Anion gap: 10 (ref 5–15)
BUN: 61 mg/dL — ABNORMAL HIGH (ref 8–23)
CO2: 31 mmol/L (ref 22–32)
Calcium: 8.3 mg/dL — ABNORMAL LOW (ref 8.9–10.3)
Chloride: 95 mmol/L — ABNORMAL LOW (ref 98–111)
Creatinine, Ser: 1.11 mg/dL — ABNORMAL HIGH (ref 0.44–1.00)
GFR, Estimated: 48 mL/min — ABNORMAL LOW (ref >60)
Glucose, Bld: 98 mg/dL (ref 70–99)
Potassium: 4.3 mmol/L (ref 3.5–5.1)
Sodium: 136 mmol/L (ref 135–145)

## 2024-03-29 LAB — CBC
HCT: 28.2 % — ABNORMAL LOW (ref 36.0–46.0)
Hemoglobin: 8.3 g/dL — ABNORMAL LOW (ref 12.0–15.0)
MCH: 27.3 pg (ref 26.0–34.0)
MCHC: 29.4 g/dL — ABNORMAL LOW (ref 30.0–36.0)
MCV: 92.8 fL (ref 80.0–100.0)
Platelets: 358 10*3/uL (ref 150–400)
RBC: 3.04 MIL/uL — ABNORMAL LOW (ref 3.87–5.11)
RDW: 18.8 % — ABNORMAL HIGH (ref 11.5–15.5)
WBC: 5.9 10*3/uL (ref 4.0–10.5)
nRBC: 0 % (ref 0.0–0.2)

## 2024-03-29 MED ORDER — ENOXAPARIN SODIUM 30 MG/0.3ML IJ SOSY
30.0000 mg | PREFILLED_SYRINGE | INTRAMUSCULAR | Status: DC
  Administered 2024-03-29: 30 mg via SUBCUTANEOUS
  Filled 2024-03-29: qty 0.3

## 2024-03-29 NOTE — Progress Notes (Signed)
 PROGRESS NOTE    [name] [name]  WUJ:[id number] DOB: Dec 03, 1934 DOA: 03/16/2024 PCP: Trenda Frisk, FNP   Assessment & Plan:   Principal Problem:   Acute on chronic HFrEF (heart failure with reduced ejection fraction) (HCC) Active Problems:   AKI (acute kidney injury) (HCC)   Cognitive decline   Normocytic anemia   Protein-calorie malnutrition, severe (HCC)   Hypertension   Atrial fibrillation, chronic (HCC)   Coronary artery disease of native artery of native heart with stable angina pectoris (HCC)  Assessment and Plan: Acute on chronic diastolic CHF: reduced EF/systolic CHF as per echo in 2021. Continue on metoprolol , lasix . Monitor I/Os.Echo on 03/16/24 shows EF 60-65%, no regional wall motion abnormalities, diastolic function could not be evaluated, mod to severe MR, mod to severe TR.    AKI: resolved   Cognitive decline: continue w/ supportive care. Pt states she is unable to care for herself at home    Iron  deficiency anemia: continue on iron  supplement. Will transfuse if Hb < 7.0    Severe protein calorie malnutrition: continue on nutritional supplements   Hx of CAD: s/p PCI with DES to mid LAD in 2021. Continue on metoprolol , aspirin     Chronic a. fib: continue on metoprolol . High fall risk   HTN: continue on BB      DVT prophylaxis: lovenox   Code Status: DNR Family Communication:  Disposition Plan: likely d/c to SNF   Level of care: Telemetry Cardiac  Status is: Inpatient Remains inpatient appropriate because: medically stable.Waiting on SNF placement still     Consultants:    Procedures:   Antimicrobials:  Subjective: Pt c/o fatigue    Objective: Vitals:   03/28/24 1946 03/28/24 2322 03/29/24 0417 03/29/24 0802  BP: (!) 141/92 138/84 (!) 151/70 (!) 157/81  Pulse: 90 (!) 51 70 64  Resp: 16 18 18 17   Temp: (!) 97.3 F (36.3 C) 98.3 F (36.8 C) 97.7 F (36.5 C) (!) 97.5 F (36.4 C)  TempSrc: Oral Oral Oral Oral  SpO2: 92% 93% 93% (!)  47%  Weight:      Height:        Intake/Output Summary (Last 24 hours) at 03/29/2024 0849 Last data filed at 03/28/2024 1900 Gross per 24 hour  Intake 120 ml  Output 350 ml  Net -230 ml    Filed Weights   03/18/24 0323  Weight: 49.8 kg    Examination:  General exam: appears calm. Frail appearing  Respiratory system: diminished breath sounds b/l  Cardiovascular system: irregularly irregular  Gastrointestinal system: abd is soft, NT, ND & hypoactive bowel sounds  Central nervous system: alert & awake. Moves all extremities   Psychiatry: judgement and insight appears poor. Flat mood and affect     Data Reviewed: I have personally reviewed following labs and imaging studies  CBC: Recent Labs  Lab 03/23/24 0612 03/24/24 0323 03/25/24 0432 03/26/24 0501 03/27/24 0622 03/28/24 0448 03/29/24 0441  WBC 8.4 8.5 8.9 7.9 7.7 6.2 5.9  NEUTROABS 6.8 7.1  --   --   --   --   --   HGB 7.9* 8.2* 8.1* 8.2* 8.5* 8.7* 8.3*  HCT 26.5* 27.7* 27.1* 28.2* 28.5* 28.8* 28.2*  MCV 91.4 93.0 92.5 93.7 91.3 92.6 92.8  PLT 261 282 289 311 325 324 358   Basic Metabolic Panel: Recent Labs  Lab 03/25/24 0432 03/26/24 0501 03/27/24 0622 03/28/24 0448 03/29/24 0441  NA 136 135 134* 134* 136  K 4.1 4.9 4.3 4.5 4.3  CL 96* 93* 94* 94* 95*  CO2 32 33* 31 30 31   GLUCOSE 102* 104* 107* 97 98  BUN 44* 48* 48* 56* 61*  CREATININE 0.96 0.96 0.86 1.05* 1.11*  CALCIUM  8.3* 8.4* 8.3* 8.2* 8.3*   GFR: Estimated Creatinine Clearance: 28.1 mL/min (A) (by C-G formula based on SCr of 1.11 mg/dL (H)). Liver Function Tests: No results for input(s): "AST", "ALT", "ALKPHOS", "BILITOT", "PROT", "ALBUMIN" in the last 168 hours. No results for input(s): "LIPASE", "AMYLASE" in the last 168 hours. No results for input(s): "AMMONIA" in the last 168 hours. Coagulation Profile: No results for input(s): "INR", "PROTIME" in the last 168 hours. Cardiac Enzymes: No results for input(s): "CKTOTAL", "CKMB",  "CKMBINDEX", "TROPONINI" in the last 168 hours. BNP (last 3 results) No results for input(s): "PROBNP" in the last 8760 hours. HbA1C: No results for input(s): "HGBA1C" in the last 72 hours. CBG: No results for input(s): "GLUCAP" in the last 168 hours. Lipid Profile: No results for input(s): "CHOL", "HDL", "LDLCALC", "TRIG", "CHOLHDL", "LDLDIRECT" in the last 72 hours. Thyroid  Function Tests: No results for input(s): "TSH", "T4TOTAL", "FREET4", "T3FREE", "THYROIDAB" in the last 72 hours. Anemia Panel: No results for input(s): "VITAMINB12", "FOLATE", "FERRITIN", "TIBC", "IRON ", "RETICCTPCT" in the last 72 hours. Sepsis Labs: No results for input(s): "PROCALCITON", "LATICACIDVEN" in the last 168 hours.  No results found for this or any previous visit (from the past 240 hours).       Radiology Studies: No results found.       Scheduled Meds:  aspirin  EC  81 mg Oral Daily   enoxaparin  (LOVENOX ) injection  40 mg Subcutaneous Q24H   feeding supplement  237 mL Oral TID BM   ferrous sulfate   325 mg Oral Daily   furosemide   40 mg Oral Daily   gabapentin   400 mg Oral TID   metoprolol  succinate  25 mg Oral Daily   multivitamin with minerals  1 tablet Oral Daily   sodium chloride  flush  3 mL Intravenous Q12H   terbinafine   250 mg Oral Daily   Continuous Infusions:   LOS: 13 days     Alphonsus Jeans, MD [facility] Pager [phone]  If 7PM-7AM, please contact night-coverage [email or web address] 03/29/2024, 8:49 AM

## 2024-03-29 NOTE — Progress Notes (Signed)
 Mobility Specialist - Progress Note   03/29/24 1500  Mobility  Activity Ambulated with assistance in room;Transferred to/from [facility]  Level of Assistance Contact guard assist, steadying assist  Assistive Device Front wheel walker  Distance Ambulated (ft) 20 ft  Activity Response Tolerated well  Mobility visit 1 Mobility     Pt lying in bed upon arrival, utilizing 2L. Pt agreeable to activity. Completed bed mobility modI + extra time. STS and ambulation in room with CGA. No LOB. Extra time to navigate around obstacles. Once returned to bed, pt voiced mild SOB and chest pain. O2 difficult to obtain d/t col phalanges. Pt left in bed with alarm set, needs in reach.    Searcy Czech Mobility Specialist 03/29/24, 3:17 PM

## 2024-03-29 NOTE — Progress Notes (Signed)
 Occupational Therapy Treatment Patient Details Name: [name] [name] MRN: [id number] DOB: [birth year] Today's Date: 03/29/2024   History of present illness [name] [name] is a 89 y.o. female with medical history significant of HFrEF with last EF of 30-35% (2021), facial basal cell carcinoma s/p radiation (2025), atrial fibrillation, CAD s/p DES to mid LAD (2021), venous insufficiency, failure to thrive, who presents to the ED due to leg swelling.   OT comments  Upon entering the room, pt supine in bed and requesting a cup of coffee and assistance to get to bathroom. Pt needing min A for bed mobility with encouragement. Pt stands with min A and transfers with multiple steps to [facility]. Pt having BM and needing assistance with clothing management and hygiene. Pt stands with min A and returns to bed secondary to fatigue. Assistance to pull pt up in bed for comfort. Call bell and all needed items within reach.       If plan is discharge home, recommend the following:  A lot of help with bathing/dressing/bathroom;Assistance with cooking/housework;Assist for transportation;Help with stairs or ramp for entrance;Supervision due to cognitive status;Direct supervision/assist for financial management;Direct supervision/assist for medications management;A little help with walking and/or transfers   Equipment Recommendations  Other (comment) (defer to next venue of care)       Precautions / Restrictions Precautions Precautions: Fall Recall of Precautions/Restrictions: Impaired       Mobility Bed Mobility Overal bed mobility: Needs Assistance Bed Mobility: Supine to Sit, Sit to Supine     Supine to sit: Min assist Sit to supine: Min assist   General bed mobility comments: vc's for technique    Transfers Overall transfer level: Needs assistance Equipment used: Rolling walker (2 wheels) Transfers: Sit to/from Stand Sit to Stand: Min assist     Step pivot transfers: Min assist            Balance Overall balance assessment: Needs assistance Sitting-balance support: No upper extremity supported, Feet supported Sitting balance-Leahy Scale: Good Sitting balance - Comments: steady reaching within BOS   Standing balance support: Bilateral upper extremity supported, Reliant on assistive device for balance, During functional activity Standing balance-Leahy Scale: Poor                             ADL either performed or assessed with clinical judgement   ADL Overall ADL's : Needs assistance/impaired                         Toilet Transfer: Minimal assistance;BSC/3in1;Rolling walker (2 wheels)   Toileting- Clothing Manipulation and Hygiene: Maximal assistance;Sit to/from stand              Extremity/Trunk Assessment Upper Extremity Assessment Upper Extremity Assessment: Generalized weakness   Lower Extremity Assessment Lower Extremity Assessment: Generalized weakness        Vision Patient Visual Report: No change from baseline     Perception     Praxis     Communication Communication Communication: Impaired Factors Affecting Communication: Hearing impaired   Cognition Arousal: Alert Behavior During Therapy: Anxious Cognition: Cognition impaired   Orientation impairments: Time, Situation, Place                           Following commands: Impaired Following commands impaired: Follows one step commands inconsistently      Cueing   Cueing Techniques: Verbal cues  Pertinent Vitals/ Pain       Pain Assessment Pain Assessment: Faces Faces Pain Scale: Hurts a little bit Pain Location: B LEs Pain Descriptors / Indicators: Guarding, Discomfort Pain Intervention(s): Limited activity within patient's tolerance, Monitored during session, Repositioned         Frequency  Min 2X/week        Progress Toward Goals  OT Goals(current goals can now be found in the care plan section)  Progress towards  OT goals: Progressing toward goals      AM-PAC OT "6 Clicks" Daily Activity     Outcome Measure   Help from another person eating meals?: None Help from another person taking care of personal grooming?: A Little Help from another person toileting, which includes using toliet, bedpan, or urinal?: A Lot Help from another person bathing (including washing, rinsing, drying)?: A Lot Help from another person to put on and taking off regular upper body clothing?: A Lot Help from another person to put on and taking off regular lower body clothing?: A Lot 6 Click Score: 15    End of Session Equipment Utilized During Treatment: Rolling walker (2 wheels);Gait belt;Oxygen  OT Visit Diagnosis: Unsteadiness on feet (R26.81);Repeated falls (R29.6);Muscle weakness (generalized) (M62.81)   Activity Tolerance Patient limited by fatigue   Patient Left with call bell/phone within reach;in bed;with bed alarm set   Nurse Communication Mobility status        Time: 2130-8657 OT Time Calculation (min): 14 min  Charges: OT General Charges $OT Visit: 1 Visit OT Treatments $Self Care/Home Management : 8-22 mins  George Kinder, MS, OTR/L , CBIS ascom [phone]  03/29/24, 3:24 PM

## 2024-03-29 NOTE — TOC Progression Note (Addendum)
 Transition of Care [name]) - Progression Note    Patient Details  Name: [name] [name] MRN: [id number] Date of Birth: [birth year]  [facility]) CM/SW Contact  Baird Bombard, RN Phone Number: 03/29/2024, 12:16 PM  Clinical Narrative:    Spoke with Tammy at [facility] . She will confirm if patient's hospice is showing as revoked and if she can be admitted today. MD updated.   1:48pm Per Tammy  hospice was confirmed as revoked.  MD made aware patient can admit when [facility] is started and approved.  Auth initiation pending PT note.      Expected Discharge Plan: Skilled Nursing Facility Barriers to Discharge: Continued Medical Work up  Expected Discharge Plan and Services     Post Acute Care Choice: Skilled Nursing Facility Living arrangements for the past 2 months: Single Family Home                                       Social Determinants of Health (SDOH) Interventions SDOH Screenings   Food Insecurity: No Food Insecurity (03/17/2024)  Housing: Low Risk  (03/17/2024)  Transportation Needs: No Transportation Needs (03/17/2024)  Utilities: Not At Risk (03/17/2024)  Depression (PHQ2-9): Low Risk  (10/29/2023)  Physical Activity: Insufficiently Active (12/25/2022)  Social Connections: Socially Isolated (03/17/2024)  Stress: Stress Concern Present (12/25/2022)  Tobacco Use: Medium Risk (03/16/2024)    Readmission Risk Interventions     No data to display

## 2024-03-29 NOTE — Plan of Care (Signed)

## 2024-03-29 NOTE — Progress Notes (Signed)
 Physical Therapy Treatment Patient Details Name: [name] [name] MRN: [id number] DOB: 1935-01-09 Today's Date: 03/29/2024   History of Present Illness Cortasia Screws is a 89 y.o. female with medical history significant of HFrEF with last EF of 30-35% (2021), facial basal cell carcinoma s/p radiation (2025), atrial fibrillation, CAD s/p DES to mid LAD (2021), venous insufficiency, failure to thrive, who presents to the ED due to leg swelling.    PT Comments  Pt resting in bed upon PT arrival; pt reports being tired (recently worked with mobility specialists) but agreeable to therapy.  During session pt was min assist with bed mobility; min assist with transfer from bed; and CGA to min assist to ambulate 30 feet with RW use (limited distance d/t pt fatigue; intermittent assist for balance and safety; vc's to stay closer to RW).  Will continue to focus on strengthening, balance, and progressive functional mobility during hospitalization.    If plan is discharge home, recommend the following: A little help with walking and/or transfers;A little help with bathing/dressing/bathroom;Assistance with cooking/housework;Direct supervision/assist for medications management;Direct supervision/assist for financial management;Assist for transportation;Help with stairs or ramp for entrance   Can travel by private vehicle     Yes  Equipment Recommendations  Rolling walker (2 wheels)    Recommendations for Other Services       Precautions / Restrictions Precautions Precautions: Fall Recall of Precautions/Restrictions: Impaired Restrictions Weight Bearing Restrictions Per Provider Order: No     Mobility  Bed Mobility Overal bed mobility: Needs Assistance Bed Mobility: Supine to Sit, Sit to Supine     Supine to sit: Min assist (assist to scoot to EOB) Sit to supine: Min assist (assist for LE's)   General bed mobility comments: vc's for technique    Transfers Overall transfer level: Needs  assistance Equipment used: Rolling walker (2 wheels) Transfers: Sit to/from Stand Sit to Stand: Min assist           General transfer comment: vc's for UE placement; assist for balance and safety    Ambulation/Gait Ambulation/Gait assistance: Contact guard assist, Min assist Gait Distance (Feet): 30 Feet Assistive device: Rolling walker (2 wheels) Gait Pattern/deviations: Step-through pattern, Decreased step length - right, Decreased step length - left Gait velocity: decreased     General Gait Details: vc's to stay closer and within RW; intermittent assist for balance   Stairs             Wheelchair Mobility     Tilt Bed    Modified Rankin (Stroke Patients Only)       Balance Overall balance assessment: Needs assistance Sitting-balance support: No upper extremity supported, Feet supported Sitting balance-Leahy Scale: Good Sitting balance - Comments: steady reaching within BOS   Standing balance support: Bilateral upper extremity supported, Reliant on assistive device for balance, During functional activity Standing balance-Leahy Scale: Poor Standing balance comment: intermittent assist for balance with ambulation                            Communication Communication Communication: Impaired Factors Affecting Communication: Hearing impaired  Cognition Arousal: Alert Behavior During Therapy: Anxious   PT - Cognitive impairments: History of cognitive impairments, Awareness, Attention, Problem solving, Safety/Judgement   Orientation impairments: Situation, Time, Place                   PT - Cognition Comments: Pt perseverating on going home Following commands: Impaired Following commands impaired: Follows one step commands inconsistently  Cueing Cueing Techniques: Verbal cues  Exercises      General Comments  Nursing cleared pt for participation in physical therapy.  Pt agreeable to PT session.      Pertinent Vitals/Pain  Pain Assessment Pain Assessment: Faces Faces Pain Scale: Hurts a little bit Pain Location: B LEs Pain Descriptors / Indicators: Guarding Pain Intervention(s): Limited activity within patient's tolerance, Monitored during session, Repositioned HR 88 bpm with SpO2 sats 99% on 1 L O2 via nasal cannula.    Home Living                          Prior Function            PT Goals (current goals can now be found in the care plan section) Acute Rehab PT Goals Patient Stated Goal: go home PT Goal Formulation: Patient unable to participate in goal setting Time For Goal Achievement: 03/31/24 Progress towards PT goals: Progressing toward goals    Frequency    Min 1X/week      PT Plan      Co-evaluation              AM-PAC PT "6 Clicks" Mobility   Outcome Measure  Help needed turning from your back to your side while in a flat bed without using bedrails?: A Little Help needed moving from lying on your back to sitting on the side of a flat bed without using bedrails?: A Little Help needed moving to and from a bed to a chair (including a wheelchair)?: A Little Help needed standing up from a chair using your arms (e.g., wheelchair or bedside chair)?: A Little Help needed to walk in hospital room?: A Little Help needed climbing 3-5 steps with a railing? : A Lot 6 Click Score: 17    End of Session Equipment Utilized During Treatment: Gait belt;Oxygen (1 L O2 via nasal cannula) Activity Tolerance: Patient limited by fatigue Patient left: in bed;with call bell/phone within reach;with bed alarm set;Other (comment) (floor mat in place) Nurse Communication: Mobility status;Precautions PT Visit Diagnosis: Unsteadiness on feet (R26.81);Other abnormalities of gait and mobility (R26.89);Muscle weakness (generalized) (M62.81);Difficulty in walking, not elsewhere classified (R26.2);History of falling (Z91.81)     Time: 1610-9604 PT Time Calculation (min) (ACUTE ONLY): 13  min  Charges:    $Therapeutic Activity: 8-22 mins PT General Charges $$ ACUTE PT VISIT: 1 Visit                     [name], PT 03/29/24, 3:07 PM

## 2024-03-30 DIAGNOSIS — I5033 Acute on chronic diastolic (congestive) heart failure: Secondary | ICD-10-CM | POA: Diagnosis not present

## 2024-03-30 LAB — BASIC METABOLIC PANEL WITH GFR
Anion gap: 8 (ref 5–15)
BUN: 49 mg/dL — ABNORMAL HIGH (ref 8–23)
CO2: 31 mmol/L (ref 22–32)
Calcium: 8.5 mg/dL — ABNORMAL LOW (ref 8.9–10.3)
Chloride: 96 mmol/L — ABNORMAL LOW (ref 98–111)
Creatinine, Ser: 0.86 mg/dL (ref 0.44–1.00)
GFR, Estimated: >60 mL/min (ref >60)
Glucose, Bld: 160 mg/dL — ABNORMAL HIGH (ref 70–99)
Potassium: 4.6 mmol/L (ref 3.5–5.1)
Sodium: 135 mmol/L (ref 135–145)

## 2024-03-30 LAB — CBC
HCT: 30.6 % — ABNORMAL LOW (ref 36.0–46.0)
Hemoglobin: 8.9 g/dL — ABNORMAL LOW (ref 12.0–15.0)
MCH: 27.1 pg (ref 26.0–34.0)
MCHC: 29.1 g/dL — ABNORMAL LOW (ref 30.0–36.0)
MCV: 93.3 fL (ref 80.0–100.0)
Platelets: 405 10*3/uL — ABNORMAL HIGH (ref 150–400)
RBC: 3.28 MIL/uL — ABNORMAL LOW (ref 3.87–5.11)
RDW: 18.6 % — ABNORMAL HIGH (ref 11.5–15.5)
WBC: 7.3 10*3/uL (ref 4.0–10.5)
nRBC: 0 % (ref 0.0–0.2)

## 2024-03-30 MED ORDER — ENOXAPARIN SODIUM 40 MG/0.4ML IJ SOSY
40.0000 mg | PREFILLED_SYRINGE | INTRAMUSCULAR | Status: DC
  Administered 2024-03-30 – 2024-04-01 (×3): 40 mg via SUBCUTANEOUS
  Filled 2024-03-30 (×3): qty 0.4

## 2024-03-30 NOTE — Progress Notes (Signed)
 Nutrition Follow-up  DOCUMENTATION CODES:   Underweight, Severe malnutrition in context of chronic illness  INTERVENTION:   -Continue with regular diet for widest variety of meal selections -Continue Ensure Enlive po TID, each supplement provides 350 kcal and 20 grams of protein.  -Continue Magic cup TID with meals, each supplement provides 290 kcal and 9 grams of protein  -Continue MVI with minerals daily -Continue feeding assistance with meals   NUTRITION DIAGNOSIS:   Severe Malnutrition related to chronic illness (CHF) as evidenced by moderate fat depletion, severe fat depletion, moderate muscle depletion, severe muscle depletion.  Ongoing  GOAL:   Patient will meet greater than or equal to 90% of their needs  Progressing   MONITOR:   PO intake, Supplement acceptance  REASON FOR ASSESSMENT:   Consult Assessment of nutrition requirement/status  ASSESSMENT:   Pt with medical history significant of HFrEF with last EF of 30-35% (2021), facial basal cell carcinoma s/p radiation (2025), atrial fibrillation, CAD s/p DES to mid LAD (2021), venous insufficiency, failure to thrive, who presents due to leg swelling  5/14- s/p BSE- dysphagia 3 diet with thin liquids  Reviewed I/O's: +240 ml x 24 hours and -712 ml since admission   Per CWOCN notes, pt with full thickness wounds to lt anterior calf, rt posterior calf, and rt inner ankle.   Pt remains on a regular diet. Meal completions have improved; PO 50-100^%. Pt also drinking Ensure supplements.   Per TOC notes, hospice was revoked on 03/18/24. Plan for SNF placement once insurance authorization is approved.   Medications reviewed and include lovenox , ferrous sulfate , lasix , and neurontin .   Labs reviewed.    Diet Order:   Diet Order             Diet regular Room service appropriate? Yes with Assist; Fluid consistency: Thin  Diet effective now                   EDUCATION NEEDS:   Education needs have been  addressed  Skin:  Skin Assessment: Skin Integrity Issues: Skin Integrity Issues:: Other (Comment) Other: bilateral venous stasis ulcers to legs; full thickness wounds to lt anterior calf, rt posterior calf, and rt inner ankle  Last BM:  03/30/24 (type 4)  Height:   Ht Readings from Last 1 Encounters:  03/18/24 5\' 6"  (1.676 m)    Weight:   Wt Readings from Last 1 Encounters:  03/30/24 47.6 kg    Ideal Body Weight:  59.1 kg  BMI:  Body mass index is 16.94 kg/m.  Estimated Nutritional Needs:   Kcal:  1750-1950  Protein:  85-100 grams  Fluid:  1.7-1.9 L    Herschel Lords, RD, LDN, CDCES Registered Dietitian III Certified Diabetes Care and Education Specialist If unable to reach this RD, please use "RD Inpatient" group chat on secure chat between hours of 8am-4 pm daily

## 2024-03-30 NOTE — Consult Note (Addendum)
 WOC Nurse Consult Note: Bilat legs have improved. Scattered areas of dark bruising. Pt complained that Una boots were tight and painful.  No edema or weeping; no further need for Una boots so they were discontinued.  Left anterior calf with dry red healing full thickness wound, .8X.8X.1cm Right posterior calf with dry red healing full thickness wound, 2X.1X.1cm Right inner ankle with dry red healing full thickness wound, .5X.5X.1cm Topical treatment orders provided for bedside nurses to perform as follows: Foam dressings to bilat legs.  Change Q 3 days or PRN soiling Please re-consult if further assistance is needed.  Thank-you,  Wiliam Harder MSN, RN, CWOCN, [place], CNS [phone]

## 2024-03-30 NOTE — TOC Progression Note (Signed)
 Transition of Care [facility]) - Progression Note    Patient Details  Name: [name] [name] MRN: [id number] Date of Birth: Jan 27, 1935  Transition [facility]) CM/SW Contact  Baird Bombard, RN Phone Number: 03/30/2024, 12:09 PM  Clinical Narrative:    [name] pending.  MD updated.    Expected Discharge Plan: Skilled Nursing Facility Barriers to Discharge: Continued Medical Work up  Expected Discharge Plan and Services     Post Acute Care Choice: Skilled Nursing Facility Living arrangements for the past 2 months: Single Family Home                                       Social Determinants of Health (SDOH) Interventions SDOH Screenings   Food Insecurity: No Food Insecurity (03/17/2024)  Housing: Low Risk  (03/17/2024)  Transportation Needs: No Transportation Needs (03/17/2024)  Utilities: Not At Risk (03/17/2024)  Depression (PHQ2-9): Low Risk  (10/29/2023)  Physical Activity: Insufficiently Active (12/25/2022)  Social Connections: Socially Isolated (03/17/2024)  Stress: Stress Concern Present (12/25/2022)  Tobacco Use: Medium Risk (03/16/2024)    Readmission Risk Interventions     No data to display

## 2024-03-30 NOTE — Progress Notes (Signed)
 Physical Therapy Treatment Patient Details Name: [name] [name] MRN: [id number] DOB: 03/04/1935 Today's Date: 03/30/2024   History of Present Illness [name] [name] is a 89 y.o. female with medical history significant of HFrEF with last EF of 30-35% (2021), facial basal cell carcinoma s/p radiation (2025), atrial fibrillation, CAD s/p DES to mid LAD (2021), venous insufficiency, failure to thrive, who presents to the ED due to leg swelling.    PT Comments  Pt resting in recliner upon PT arrival; pt agreeable to therapy; pt requesting to toilet during session.  Pt was min assist progressing to CGA with transfers; CGA to min assist for ambulation (30 feet x2 with RW use; sitting rest break between ambulation trials required d/t SOB and fatigue); and min assist to lay back down in bed end of session.  SpO2 sats 96% or greater on 2 L O2 via nasal cannula during sessions activities.  Will continue to focus on strengthening, balance, and progressive functional mobility during hospitalization.    If plan is discharge home, recommend the following: A little help with walking and/or transfers;A little help with bathing/dressing/bathroom;Assistance with cooking/housework;Direct supervision/assist for medications management;Direct supervision/assist for financial management;Assist for transportation;Help with stairs or ramp for entrance   Can travel by private vehicle        Equipment Recommendations  Rolling walker (2 wheels)    Recommendations for Other Services       Precautions / Restrictions Precautions Precautions: Fall Recall of Precautions/Restrictions: Impaired Restrictions Weight Bearing Restrictions Per Provider Order: No     Mobility  Bed Mobility Overal bed mobility: Needs Assistance Bed Mobility: Sit to Supine       Sit to supine: Min assist   General bed mobility comments: assist for B LE's    Transfers Overall transfer level: Needs assistance Equipment used: Rolling  walker (2 wheels) Transfers: Sit to/from Stand Sit to Stand: Min assist, Contact guard assist   Step pivot transfers: Min assist (assist to steady recliner to [facility])       General transfer comment: min assist progressing to CGA x1 trial standing from recliner; x1 trial standing from [facility], and x1 trial standing from bed; vc's for UE positioning/placement    Ambulation/Gait Ambulation/Gait assistance: Contact guard assist, Min assist Gait Distance (Feet):  (30 feet x2) Assistive device: Rolling walker (2 wheels) Gait Pattern/deviations: Step-through pattern, Decreased step length - right, Decreased step length - left Gait velocity: decreased     General Gait Details: vc's to stay closer and within RW; intermittent assist for balance; limited distance d/t SOB and fatigue   Stairs             Wheelchair Mobility     Tilt Bed    Modified Rankin (Stroke Patients Only)       Balance Overall balance assessment: Needs assistance Sitting-balance support: No upper extremity supported, Feet supported Sitting balance-Leahy Scale: Good Sitting balance - Comments: steady reaching within BOS   Standing balance support: Bilateral upper extremity supported, Reliant on assistive device for balance, During functional activity Standing balance-Leahy Scale: Poor Standing balance comment: intermittent assist for balance with ambulation                            Communication Communication Communication: Impaired Factors Affecting Communication: Hearing impaired  Cognition Arousal: Alert Behavior During Therapy: Anxious   PT - Cognitive impairments: History of cognitive impairments, Awareness, Attention, Problem solving, Safety/Judgement   Orientation impairments: Situation, Time, Place  PT - Cognition Comments: Pt perseverating on going home Following commands: Impaired Following commands impaired: Follows one step commands inconsistently     Cueing Cueing Techniques: Verbal cues  Exercises      General Comments        Pertinent Vitals/Pain Pain Assessment Pain Assessment: Faces Faces Pain Scale: Hurts little more Pain Location: pt's bottom and neck from sitting in recliner Pain Descriptors / Indicators: Discomfort Pain Intervention(s): Limited activity within patient's tolerance, Monitored during session, RepositionedHR HR 86-98 bpm during sessions activities.    Home Living                          Prior Function            PT Goals (current goals can now be found in the care plan section) Acute Rehab PT Goals Patient Stated Goal: go home PT Goal Formulation: With patient Time For Goal Achievement: 03/31/24 Progress towards PT goals: Progressing toward goals    Frequency    Min 1X/week      PT Plan      Co-evaluation              AM-PAC PT "6 Clicks" Mobility   Outcome Measure  Help needed turning from your back to your side while in a flat bed without using bedrails?: A Little Help needed moving from lying on your back to sitting on the side of a flat bed without using bedrails?: A Little Help needed moving to and from a bed to a chair (including a wheelchair)?: A Little Help needed standing up from a chair using your arms (e.g., wheelchair or bedside chair)?: A Little Help needed to walk in hospital room?: A Little Help needed climbing 3-5 steps with a railing? : A Lot 6 Click Score: 17    End of Session Equipment Utilized During Treatment: Gait belt;Oxygen (2 L O2 via nasal cannula) Activity Tolerance: Patient limited by fatigue Patient left: in bed;with call bell/phone within reach;with bed alarm set;Other (comment) (fall mat in place) Nurse Communication: Mobility status;Precautions PT Visit Diagnosis: Unsteadiness on feet (R26.81);Other abnormalities of gait and mobility (R26.89);Muscle weakness (generalized) (M62.81);Difficulty in walking, not elsewhere classified  (R26.2);History of falling (Z91.81)     Time: 1191-4782 PT Time Calculation (min) (ACUTE ONLY): 21 min  Charges:    $Therapeutic Activity: 8-22 mins PT General Charges $$ ACUTE PT VISIT: 1 Visit                     [name], PT 03/30/24, 4:14 PM
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
## 2024-03-30 NOTE — Plan of Care (Signed)

## 2024-03-31 DIAGNOSIS — E43 Unspecified severe protein-calorie malnutrition: Secondary | ICD-10-CM | POA: Diagnosis not present

## 2024-03-31 DIAGNOSIS — I5033 Acute on chronic diastolic (congestive) heart failure: Secondary | ICD-10-CM | POA: Diagnosis not present

## 2024-03-31 LAB — BASIC METABOLIC PANEL WITH GFR
Anion gap: 10 (ref 5–15)
BUN: 48 mg/dL — ABNORMAL HIGH (ref 8–23)
CO2: 30 mmol/L (ref 22–32)
Calcium: 8.6 mg/dL — ABNORMAL LOW (ref 8.9–10.3)
Chloride: 96 mmol/L — ABNORMAL LOW (ref 98–111)
Creatinine, Ser: 0.94 mg/dL (ref 0.44–1.00)
GFR, Estimated: 59 mL/min — ABNORMAL LOW (ref >60)
Glucose, Bld: 121 mg/dL — ABNORMAL HIGH (ref 70–99)
Potassium: 4.2 mmol/L (ref 3.5–5.1)
Sodium: 136 mmol/L (ref 135–145)

## 2024-03-31 LAB — MAGNESIUM: Magnesium: 2.5 mg/dL — ABNORMAL HIGH (ref 1.7–2.4)

## 2024-03-31 MED ORDER — GUAIFENESIN-DM 100-10 MG/5ML PO SYRP
5.0000 mL | ORAL_SOLUTION | ORAL | Status: DC | PRN
  Administered 2024-03-31: 5 mL via ORAL
  Filled 2024-03-31: qty 10

## 2024-03-31 MED ORDER — LISINOPRIL 5 MG PO TABS
5.0000 mg | ORAL_TABLET | Freq: Every day | ORAL | Status: DC
  Administered 2024-03-31 – 2024-04-02 (×3): 5 mg via ORAL
  Filled 2024-03-31 (×3): qty 1

## 2024-03-31 NOTE — TOC Progression Note (Signed)
 Transition of Care [facility]) - Progression Note    Patient Details  Name: [name] [name] MRN: [id number] Date of Birth: [birth year]  [facility]) CM/SW Contact  Baird Bombard, RN Phone Number: 03/31/2024, 4:38 PM  Clinical Narrative:    Patient approved for SNF per [facility] Attempt to reach admission at [facility]. No answer.    Expected Discharge Plan: Skilled Nursing Facility Barriers to Discharge: Continued Medical Work up  Expected Discharge Plan and Services     Post Acute Care Choice: Skilled Nursing Facility Living arrangements for the past 2 months: Single Family Home                                       Social Determinants of Health (SDOH) Interventions SDOH Screenings   Food Insecurity: No Food Insecurity (03/17/2024)  Housing: Low Risk  (03/17/2024)  Transportation Needs: No Transportation Needs (03/17/2024)  Utilities: Not At Risk (03/17/2024)  Depression (PHQ2-9): Low Risk  (10/29/2023)  Physical Activity: Insufficiently Active (12/25/2022)  Social Connections: Socially Isolated (03/17/2024)  Stress: Stress Concern Present (12/25/2022)  Tobacco Use: Medium Risk (03/16/2024)    Readmission Risk Interventions     No data to display
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
## 2024-03-31 NOTE — Progress Notes (Signed)
       CROSS COVER NOTE  NAME: [name] [name] MRN: [id number] DOB : May 08, 1935    Concern as stated by nurse / staff   notified by telemetry patient had a 6 beat run of vtach. she was going to the bedside toilet. bp 152/83 map 106 hr 76.      Pertinent findings on chart review: Last progress note reviewed Last labs reviewed from 5/20 with potassium normal at 4.6, no recent mag  Assessment and  Interventions   Assessment:  Nonsustained V. tach, asymptomatic  Plan: Repeating potassium and magnesium and supplement as needed to keep mag over 2 and potassium over 4  Mag 2.5     Latest Ref Rng & Units 03/31/2024   10:49 PM 03/30/2024    9:37 AM 03/29/2024    4:41 AM  BMP  Glucose 70 - 99 mg/dL 621  308  98   BUN 8 - 23 mg/dL 48  49  61   Creatinine 0.44 - 1.00 mg/dL 6.57  8.46  9.62   Sodium 135 - 145 mmol/L 136  135  136   Potassium 3.5 - 5.1 mmol/L 4.2  4.6  4.3   Chloride 98 - 111 mmol/L 96  96  95   CO2 22 - 32 mmol/L 30  31  31    Calcium  8.9 - 10.3 mg/dL 8.6  8.5  8.3     X

## 2024-03-31 NOTE — Plan of Care (Signed)
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
## 2024-03-31 NOTE — Plan of Care (Signed)
   Problem: Education: Goal: Knowledge of General Education information will improve Description: Including pain rating scale, medication(s)/side effects and non-pharmacologic comfort measures Outcome: Progressing   Problem: Activity: Goal: Risk for activity intolerance will decrease Outcome: Progressing

## 2024-03-31 NOTE — Progress Notes (Addendum)
 Physical Therapy Treatment Patient Details Name: [name] [name] MRN: [id number] DOB: [birth year] Today's Date: 03/31/2024   History of Present Illness [name] [name] is a 89 y.o. female with medical history significant of HFrEF with last EF of 30-35% (2021), facial basal cell carcinoma s/p radiation (2025), atrial fibrillation, CAD s/p DES to mid LAD (2021), venous insufficiency, failure to thrive, who presents to the ED due to leg swelling.    PT Comments  Patient received in bed, states she is feeling awful. She reports back pain. Patient is agreeable to PT session. She required mod A to go from supine to sitting edge of bed. Patient is able to stand with min A, ambulated 25 feet with RW. Requires min A for safety with mobility and cues for safe use of AD. She will continue to benefit from skilled PT to improve safety and independence with mobility. Goals updated.      If plan is discharge home, recommend the following: A little help with walking and/or transfers;A little help with bathing/dressing/bathroom;Assistance with cooking/housework;Direct supervision/assist for medications management;Direct supervision/assist for financial management;Assist for transportation;Help with stairs or ramp for entrance   Can travel by private vehicle     Yes  Equipment Recommendations  Rolling walker (2 wheels)    Recommendations for Other Services       Precautions / Restrictions Precautions Precautions: Fall Recall of Precautions/Restrictions: Impaired Restrictions Weight Bearing Restrictions Per Provider Order: No     Mobility  Bed Mobility Overal bed mobility: Needs Assistance Bed Mobility: Supine to Sit, Sit to Supine     Supine to sit: HOB elevated, Mod assist Sit to supine: Supervision   General bed mobility comments: assist to elevate trunk to seated position    Transfers Overall transfer level: Needs assistance Equipment used: Rolling walker (2 wheels) Transfers: Sit to/from  Stand Sit to Stand: Min assist                Ambulation/Gait Ambulation/Gait assistance: Contact guard assist   Assistive device: Rolling walker (2 wheels) Gait Pattern/deviations: Step-through pattern, Decreased step length - right, Decreased step length - left Gait velocity: decreased     General Gait Details: Patient requires cues for safety with RW. Limited distance due to pain, fatigue   Stairs             Wheelchair Mobility     Tilt Bed    Modified Rankin (Stroke Patients Only)       Balance Overall balance assessment: Needs assistance Sitting-balance support: Feet supported Sitting balance-Leahy Scale: Fair     Standing balance support: Bilateral upper extremity supported, During functional activity, Reliant on assistive device for balance Standing balance-Leahy Scale: Fair Standing balance comment: intermittent assist for balance with ambulation                            Communication Communication Communication: Impaired Factors Affecting Communication: Hearing impaired  Cognition Arousal: Alert Behavior During Therapy: Anxious   PT - Cognitive impairments: History of cognitive impairments, Awareness, Attention, Problem solving, Safety/Judgement   Orientation impairments: Situation, Time, Place                     Following commands: Impaired Following commands impaired: Follows one step commands with increased time    Cueing Cueing Techniques: Verbal cues  Exercises      General Comments        Pertinent Vitals/Pain Pain Assessment Breathing: normal Negative Vocalization:  none Facial Expression: smiling or inexpressive Body Language: relaxed Consolability: no need to console PAINAD Score: 0 Pain Location: back- all over Pain Descriptors / Indicators: Discomfort Pain Intervention(s): Monitored during session, Repositioned    Home Living                          Prior Function             PT Goals (current goals can now be found in the care plan section) Acute Rehab PT Goals Patient Stated Goal: states she is going to rehab PT Goal Formulation: With patient Time For Goal Achievement: 04/10/24 Progress towards PT goals: Progressing toward goals    Frequency    Min 1X/week      PT Plan      Co-evaluation              AM-PAC PT "6 Clicks" Mobility   Outcome Measure  Help needed turning from your back to your side while in a flat bed without using bedrails?: A Little Help needed moving from lying on your back to sitting on the side of a flat bed without using bedrails?: A Little Help needed moving to and from a bed to a chair (including a wheelchair)?: A Little Help needed standing up from a chair using your arms (e.g., wheelchair or bedside chair)?: A Little Help needed to walk in hospital room?: A Lot Help needed climbing 3-5 steps with a railing? : A Lot 6 Click Score: 16    End of Session Equipment Utilized During Treatment: Oxygen;Gait belt Activity Tolerance: Patient limited by fatigue Patient left: in bed;with call bell/phone within reach;with bed alarm set Nurse Communication: Mobility status PT Visit Diagnosis: Unsteadiness on feet (R26.81);Other abnormalities of gait and mobility (R26.89);Muscle weakness (generalized) (M62.81);Difficulty in walking, not elsewhere classified (R26.2);History of falling (Z91.81)     Time: 1430-1440 PT Time Calculation (min) (ACUTE ONLY): 10 min  Charges:    $Gait Training: 8-22 mins PT General Charges $$ ACUTE PT VISIT: 1 Visit                     Purvi Ruehl, PT, GCS 03/31/24,3:01 PM

## 2024-04-01 ENCOUNTER — Ambulatory Visit: Admitting: Family

## 2024-04-01 DIAGNOSIS — I5033 Acute on chronic diastolic (congestive) heart failure: Secondary | ICD-10-CM | POA: Diagnosis not present

## 2024-04-01 MED ORDER — FUROSEMIDE 40 MG PO TABS: 40.0000 mg | ORAL_TABLET | Freq: Every day | ORAL | Status: AC

## 2024-04-01 MED ORDER — OXYCODONE-ACETAMINOPHEN 10-325 MG PO TABS
1.0000 | ORAL_TABLET | Freq: Four times a day (QID) | ORAL | 0 refills | Status: AC | PRN
Start: 2024-04-01 — End: ?

## 2024-04-01 NOTE — Plan of Care (Signed)
   Problem: Activity: Goal: Risk for activity intolerance will decrease Outcome: Progressing
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
## 2024-04-01 NOTE — TOC Progression Note (Addendum)
 Transition of Care [facility]) - Progression Note    Patient Details  Name: [name] [name] MRN: [id number] Date of Birth: [birth year]  [facility]) CM/SW Contact  Baird Bombard, RN Phone Number: 04/01/2024, 1:21 PM  Clinical Narrative:    Spoke with Gena at [facility]. Patient can be admitted today.  MD made aware.   2:30pm Patient has telesitter. Per [facility] at [facility] patient has to be sitter free for 24 hours. Patient [name] will expire 5/23. MD made aware.       Expected Discharge Plan: Skilled Nursing Facility Barriers to Discharge: Continued Medical Work up  Expected Discharge Plan and Services     Post Acute Care Choice: Skilled Nursing Facility Living arrangements for the past 2 months: Single Family Home                                       Social Determinants of Health (SDOH) Interventions SDOH Screenings   Food Insecurity: No Food Insecurity (03/17/2024)  Housing: Low Risk  (03/17/2024)  Transportation Needs: No Transportation Needs (03/17/2024)  Utilities: Not At Risk (03/17/2024)  Depression (PHQ2-9): Low Risk  (10/29/2023)  Physical Activity: Insufficiently Active (12/25/2022)  Social Connections: Socially Isolated (03/17/2024)  Stress: Stress Concern Present (12/25/2022)  Tobacco Use: Medium Risk (03/16/2024)    Readmission Risk Interventions     No data to display

## 2024-04-02 DIAGNOSIS — I5033 Acute on chronic diastolic (congestive) heart failure: Secondary | ICD-10-CM | POA: Diagnosis not present

## 2024-04-02 NOTE — TOC Progression Note (Addendum)
 Transition of Care [facility]) - Progression Note    Patient Details  Name: [name] [name] MRN: [id number] Date of Birth: 06/28/1935  [facility]) CM/SW Contact  Crayton Docker, RN 04/02/2024, 2:18 PM  Clinical Narrative:     Alert received from RN Lane Pinon regarding patient 24 hours telesitter/sitter free. CM call to Kristie, Admissions, [facility] [place]. Per Gillian Lacrosse, will review for bed availability and call CM back. CM alert to Dr. Leory Rands and RN Lane Pinon regarding SNF response.   Expected Discharge Plan: Skilled Nursing Facility Barriers to Discharge: Continued Medical Work up  Expected Discharge Plan and Services     Post Acute Care Choice: Skilled Nursing Facility Living arrangements for the past 2 months: Single Family Home Expected Discharge Date: 04/01/24                  Social Determinants of Health (SDOH) Interventions SDOH Screenings   Food Insecurity: No Food Insecurity (03/17/2024)  Housing: Low Risk  (03/17/2024)  Transportation Needs: No Transportation Needs (03/17/2024)  Utilities: Not At Risk (03/17/2024)  Depression (PHQ2-9): Low Risk  (10/29/2023)  Physical Activity: Insufficiently Active (12/25/2022)  Social Connections: Socially Isolated (03/17/2024)  Stress: Stress Concern Present (12/25/2022)  Tobacco Use: Medium Risk (03/16/2024)    Readmission Risk Interventions     No data to display

## 2024-04-02 NOTE — Progress Notes (Signed)
 [name] [name] to be D/C'd Skilled nursing facility per MD order.  Discussed with the patient and all questions fully answered.  IV catheter discontinued intact. Site without signs and symptoms of complications. Dressing and pressure applied.  An After Visit Summary was printed and given to [facility] for receiving facility..   Patient instructed to return to ED, call 911, or call MD for any changes in condition.   Patient escorted via stretcher, and D/C to [facility] via medical tranport.  Lonnell Rob 04/02/2024 5:08 PM

## 2024-04-02 NOTE — Progress Notes (Signed)
 Attempted to give report to [facility]. This RN tried to give her phone number for call back, and was refused and then hung up on.

## 2024-04-02 NOTE — TOC Transition Note (Signed)
 Transition of Care [name]) - Discharge Note   Patient Details  Name: [name] [name] MRN: [id number] Date of Birth: 1935/01/20  Transition of Care [facility]) CM/SW Contact:  Crayton Docker, RN 04/02/2024, 2:42 PM   Clinical Narrative:     Call received from Kristie, Admissions, [facility] [place],patient can admit to Room 714.CM alert to Dr. Leory Rands and RN Lane Pinon regarding updating discharge summary for SNF.  Discharge orders noted for SNF, discharge summary noted. CM faxed discharge orders, discharge summary, SNF transfer report to [facility].  Patient to discharge to [facility] [place] via [employer]. CM call to [facility]. CM spoke to [place]. BLS transport scheduled for 1700.  Final next level of care: Skilled Nursing Facility Barriers to Discharge: No Barriers Identified   Patient Goals and CMS Choice    SNF  Discharge Placement       SNF          Discharge Plan and Services Additional resources added to the After Visit Summary for     Post Acute Care Choice: Skilled Nursing Facility              Social Drivers of Health (SDOH) Interventions SDOH Screenings   Food Insecurity: No Food Insecurity (03/17/2024)  Housing: Low Risk  (03/17/2024)  Transportation Needs: No Transportation Needs (03/17/2024)  Utilities: Not At Risk (03/17/2024)  Depression (PHQ2-9): Low Risk  (10/29/2023)  Physical Activity: Insufficiently Active (12/25/2022)  Social Connections: Socially Isolated (03/17/2024)  Stress: Stress Concern Present (12/25/2022)  Tobacco Use: Medium Risk (03/16/2024)     Readmission Risk Interventions     No data to display
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
## 2024-04-02 NOTE — Plan of Care (Signed)

## 2024-04-02 NOTE — Progress Notes (Signed)
 Occupational Therapy Treatment Patient Details Name: [name] [name] MRN: [id number] DOB: September 06, 1935 Today's Date: 04/02/2024   History of present illness [name] [name] is a 89 y.o. female with medical history significant of HFrEF with last EF of 30-35% (2021), facial basal cell carcinoma s/p radiation (2025), atrial fibrillation, CAD s/p DES to mid LAD (2021), venous insufficiency, failure to thrive, who presents to the ED due to leg swelling.   OT comments  Pt seen for OT tx. Pt received in the recliner, agreeable to OT session and eager to return to bed. Pt endorsing back pain and RN notified per pt's request. Pt declined grooming participation. Required VC for hand placement and MIN A for STS from recliner with initial posterior lean which she was able to correct with cues for anterior weight shift with RW. Pt took a few steps with RW with CGA. MIN A for BLE mgt back to bed. Time and adjustments to bed positioning to improve pt comfort. Pt endorses mild SOB. Noted to be on 4L, SpO2 100%, HR in 110's improving to 90's with rest break. Pt continues to benefit.       If plan is discharge home, recommend the following:  A lot of help with bathing/dressing/bathroom;Assistance with cooking/housework;Assist for transportation;Help with stairs or ramp for entrance;Supervision due to cognitive status;Direct supervision/assist for financial management;Direct supervision/assist for medications management;A little help with walking and/or transfers   Equipment Recommendations  Other (comment) (defer)    Recommendations for Other Services      Precautions / Restrictions Precautions Precautions: Fall Recall of Precautions/Restrictions: Impaired Restrictions Weight Bearing Restrictions Per Provider Order: No       Mobility Bed Mobility Overal bed mobility: Needs Assistance Bed Mobility: Sit to Supine       Sit to supine: Min assist   General bed mobility comments: MIN A for BLE mgt     Transfers Overall transfer level: Needs assistance Equipment used: Rolling walker (2 wheels) Transfers: Sit to/from Stand Sit to Stand: Min assist           General transfer comment: VC for hand placement     Balance Overall balance assessment: Needs assistance Sitting-balance support: Feet supported Sitting balance-Leahy Scale: Fair   Postural control: Posterior lean Standing balance support: Bilateral upper extremity supported, During functional activity, Reliant on assistive device for balance Standing balance-Leahy Scale: Fair                             ADL either performed or assessed with clinical judgement   ADL Overall ADL's : Needs assistance/impaired                     Lower Body Dressing: Maximal assistance;Sit to/from stand               Functional mobility during ADLs: Minimal assistance;Contact guard assist;Rolling walker (2 wheels)      Extremity/Trunk Assessment              Vision       Perception     Praxis     Communication Communication Communication: Impaired Factors Affecting Communication: Hearing impaired   Cognition Arousal: Alert Behavior During Therapy: Anxious Cognition: No family/caregiver present to determine baseline                               Following commands: Impaired Following commands impaired: Follows one  step commands with increased time      Cueing   Cueing Techniques: Verbal cues  Exercises      Shoulder Instructions       General Comments SpO2 100% on 4L, RN notified to confirm how much she should be on    Pertinent Vitals/ Pain       Pain Assessment Pain Assessment: Faces Faces Pain Scale: Hurts even more Pain Location: back- all over Pain Descriptors / Indicators: Aching, Grimacing, Moaning, Guarding Pain Intervention(s): Limited activity within patient's tolerance, Monitored during session, Repositioned, Patient requesting pain meds-RN  notified  Home Living                                          Prior Functioning/Environment              Frequency  Min 2X/week        Progress Toward Goals  OT Goals(current goals can now be found in the care plan section)  Progress towards OT goals: Progressing toward goals  Acute Rehab OT Goals Patient Stated Goal: improve function/strength OT Goal Formulation: With patient Time For Goal Achievement: 04/28/24 Potential to Achieve Goals: Fair  Plan      Co-evaluation                 AM-PAC OT "6 Clicks" Daily Activity     Outcome Measure   Help from another person eating meals?: None Help from another person taking care of personal grooming?: A Little Help from another person toileting, which includes using toliet, bedpan, or urinal?: A Lot Help from another person bathing (including washing, rinsing, drying)?: A Lot Help from another person to put on and taking off regular upper body clothing?: A Little Help from another person to put on and taking off regular lower body clothing?: A Lot 6 Click Score: 16    End of Session Equipment Utilized During Treatment: Rolling walker (2 wheels);Oxygen  OT Visit Diagnosis: Unsteadiness on feet (R26.81);Repeated falls (R29.6);Muscle weakness (generalized) (M62.81)   Activity Tolerance Patient limited by pain   Patient Left with call bell/phone within reach;in bed;with bed alarm set   Nurse Communication Mobility status;Patient requests pain meds;Other (comment) (O2)        Time: 1610-9604 OT Time Calculation (min): 11 min  Charges: OT General Charges $OT Visit: 1 Visit OT Treatments $Therapeutic Activity: 8-22 mins  Berenda Breaker., MPH, MS, OTR/L ascom [phone] 04/02/24, 12:53 PM

## 2024-04-04 DIAGNOSIS — K5900 Constipation, unspecified: Secondary | ICD-10-CM | POA: Diagnosis not present

## 2024-04-06 DIAGNOSIS — I5022 Chronic systolic (congestive) heart failure: Secondary | ICD-10-CM | POA: Diagnosis not present

## 2024-04-08 DIAGNOSIS — I2510 Atherosclerotic heart disease of native coronary artery without angina pectoris: Secondary | ICD-10-CM | POA: Diagnosis not present

## 2024-04-08 DIAGNOSIS — I10 Essential (primary) hypertension: Secondary | ICD-10-CM | POA: Diagnosis not present

## 2024-04-08 DIAGNOSIS — D508 Other iron deficiency anemias: Secondary | ICD-10-CM | POA: Diagnosis not present

## 2024-04-08 DIAGNOSIS — I5033 Acute on chronic diastolic (congestive) heart failure: Secondary | ICD-10-CM | POA: Diagnosis not present

## 2024-04-08 DIAGNOSIS — Z515 Encounter for palliative care: Secondary | ICD-10-CM | POA: Diagnosis not present

## 2024-04-11 DIAGNOSIS — L89614 Pressure ulcer of right heel, stage 4: Secondary | ICD-10-CM | POA: Diagnosis not present

## 2024-04-11 DIAGNOSIS — D649 Anemia, unspecified: Secondary | ICD-10-CM | POA: Diagnosis not present

## 2024-04-11 DIAGNOSIS — L89514 Pressure ulcer of right ankle, stage 4: Secondary | ICD-10-CM | POA: Diagnosis not present

## 2024-04-11 DIAGNOSIS — M6259 Muscle wasting and atrophy, not elsewhere classified, multiple sites: Secondary | ICD-10-CM | POA: Diagnosis not present

## 2024-04-11 DIAGNOSIS — R1311 Dysphagia, oral phase: Secondary | ICD-10-CM | POA: Diagnosis not present

## 2024-04-11 DIAGNOSIS — G8929 Other chronic pain: Secondary | ICD-10-CM | POA: Diagnosis not present

## 2024-04-11 DIAGNOSIS — L89623 Pressure ulcer of left heel, stage 3: Secondary | ICD-10-CM | POA: Diagnosis not present

## 2024-04-11 DIAGNOSIS — I87333 Chronic venous hypertension (idiopathic) with ulcer and inflammation of bilateral lower extremity: Secondary | ICD-10-CM | POA: Diagnosis not present

## 2024-04-11 DIAGNOSIS — L97222 Non-pressure chronic ulcer of left calf with fat layer exposed: Secondary | ICD-10-CM | POA: Diagnosis not present

## 2024-04-11 DIAGNOSIS — I10 Essential (primary) hypertension: Secondary | ICD-10-CM | POA: Diagnosis not present

## 2024-04-11 DIAGNOSIS — N1831 Chronic kidney disease, stage 3a: Secondary | ICD-10-CM | POA: Diagnosis not present

## 2024-04-11 DIAGNOSIS — I5043 Acute on chronic combined systolic (congestive) and diastolic (congestive) heart failure: Secondary | ICD-10-CM | POA: Diagnosis not present

## 2024-04-11 DIAGNOSIS — I5022 Chronic systolic (congestive) heart failure: Secondary | ICD-10-CM | POA: Diagnosis not present

## 2024-04-11 DIAGNOSIS — R609 Edema, unspecified: Secondary | ICD-10-CM | POA: Diagnosis not present

## 2024-04-11 DIAGNOSIS — I87312 Chronic venous hypertension (idiopathic) with ulcer of left lower extremity: Secondary | ICD-10-CM | POA: Diagnosis not present

## 2024-04-11 DIAGNOSIS — D508 Other iron deficiency anemias: Secondary | ICD-10-CM | POA: Diagnosis not present

## 2024-04-11 DIAGNOSIS — R627 Adult failure to thrive: Secondary | ICD-10-CM | POA: Diagnosis not present

## 2024-04-11 DIAGNOSIS — L089 Local infection of the skin and subcutaneous tissue, unspecified: Secondary | ICD-10-CM | POA: Diagnosis not present

## 2024-04-11 DIAGNOSIS — R5383 Other fatigue: Secondary | ICD-10-CM | POA: Diagnosis not present

## 2024-04-11 DIAGNOSIS — R410 Disorientation, unspecified: Secondary | ICD-10-CM | POA: Diagnosis not present

## 2024-04-11 DIAGNOSIS — E785 Hyperlipidemia, unspecified: Secondary | ICD-10-CM | POA: Diagnosis not present

## 2024-04-11 DIAGNOSIS — I2510 Atherosclerotic heart disease of native coronary artery without angina pectoris: Secondary | ICD-10-CM | POA: Diagnosis not present

## 2024-04-11 DIAGNOSIS — I5033 Acute on chronic diastolic (congestive) heart failure: Secondary | ICD-10-CM | POA: Diagnosis not present

## 2024-04-11 DIAGNOSIS — E43 Unspecified severe protein-calorie malnutrition: Secondary | ICD-10-CM | POA: Diagnosis not present

## 2024-04-11 DIAGNOSIS — M4120 Other idiopathic scoliosis, site unspecified: Secondary | ICD-10-CM | POA: Diagnosis not present

## 2024-04-11 DIAGNOSIS — Z741 Need for assistance with personal care: Secondary | ICD-10-CM | POA: Diagnosis not present

## 2024-04-11 DIAGNOSIS — I482 Chronic atrial fibrillation, unspecified: Secondary | ICD-10-CM | POA: Diagnosis not present

## 2024-04-11 DIAGNOSIS — I739 Peripheral vascular disease, unspecified: Secondary | ICD-10-CM | POA: Diagnosis not present

## 2024-04-11 DIAGNOSIS — G990 Autonomic neuropathy in diseases classified elsewhere: Secondary | ICD-10-CM | POA: Diagnosis not present

## 2024-04-11 DIAGNOSIS — E569 Vitamin deficiency, unspecified: Secondary | ICD-10-CM | POA: Diagnosis not present

## 2024-04-12 DIAGNOSIS — I10 Essential (primary) hypertension: Secondary | ICD-10-CM | POA: Diagnosis not present

## 2024-04-12 DIAGNOSIS — N1831 Chronic kidney disease, stage 3a: Secondary | ICD-10-CM | POA: Diagnosis not present

## 2024-04-12 DIAGNOSIS — L089 Local infection of the skin and subcutaneous tissue, unspecified: Secondary | ICD-10-CM | POA: Diagnosis not present

## 2024-04-12 DIAGNOSIS — D508 Other iron deficiency anemias: Secondary | ICD-10-CM | POA: Diagnosis not present

## 2024-04-15 DIAGNOSIS — R627 Adult failure to thrive: Secondary | ICD-10-CM | POA: Diagnosis not present

## 2024-04-15 DIAGNOSIS — I482 Chronic atrial fibrillation, unspecified: Secondary | ICD-10-CM | POA: Diagnosis not present

## 2024-04-15 DIAGNOSIS — I2510 Atherosclerotic heart disease of native coronary artery without angina pectoris: Secondary | ICD-10-CM | POA: Diagnosis not present

## 2024-04-15 DIAGNOSIS — L089 Local infection of the skin and subcutaneous tissue, unspecified: Secondary | ICD-10-CM | POA: Diagnosis not present

## 2024-04-15 DIAGNOSIS — I10 Essential (primary) hypertension: Secondary | ICD-10-CM | POA: Diagnosis not present

## 2024-04-15 DIAGNOSIS — R410 Disorientation, unspecified: Secondary | ICD-10-CM | POA: Diagnosis not present

## 2024-04-15 DIAGNOSIS — G8929 Other chronic pain: Secondary | ICD-10-CM | POA: Diagnosis not present

## 2024-04-15 DIAGNOSIS — I5043 Acute on chronic combined systolic (congestive) and diastolic (congestive) heart failure: Secondary | ICD-10-CM | POA: Diagnosis not present

## 2024-04-19 DIAGNOSIS — I10 Essential (primary) hypertension: Secondary | ICD-10-CM | POA: Diagnosis not present

## 2024-04-19 DIAGNOSIS — L089 Local infection of the skin and subcutaneous tissue, unspecified: Secondary | ICD-10-CM | POA: Diagnosis not present

## 2024-04-19 DIAGNOSIS — I5033 Acute on chronic diastolic (congestive) heart failure: Secondary | ICD-10-CM | POA: Diagnosis not present

## 2024-04-19 DIAGNOSIS — N1831 Chronic kidney disease, stage 3a: Secondary | ICD-10-CM | POA: Diagnosis not present

## 2024-04-22 DIAGNOSIS — D649 Anemia, unspecified: Secondary | ICD-10-CM | POA: Diagnosis not present

## 2024-04-22 DIAGNOSIS — L97222 Non-pressure chronic ulcer of left calf with fat layer exposed: Secondary | ICD-10-CM | POA: Diagnosis not present

## 2024-04-22 DIAGNOSIS — I87333 Chronic venous hypertension (idiopathic) with ulcer and inflammation of bilateral lower extremity: Secondary | ICD-10-CM | POA: Diagnosis not present

## 2024-04-22 DIAGNOSIS — I87312 Chronic venous hypertension (idiopathic) with ulcer of left lower extremity: Secondary | ICD-10-CM | POA: Diagnosis not present

## 2024-04-22 DIAGNOSIS — L89514 Pressure ulcer of right ankle, stage 4: Secondary | ICD-10-CM | POA: Diagnosis not present

## 2024-04-22 DIAGNOSIS — I5022 Chronic systolic (congestive) heart failure: Secondary | ICD-10-CM | POA: Diagnosis not present

## 2024-04-22 DIAGNOSIS — L89614 Pressure ulcer of right heel, stage 4: Secondary | ICD-10-CM | POA: Diagnosis not present

## 2024-04-22 DIAGNOSIS — L089 Local infection of the skin and subcutaneous tissue, unspecified: Secondary | ICD-10-CM | POA: Diagnosis not present

## 2024-04-23 DIAGNOSIS — I482 Chronic atrial fibrillation, unspecified: Secondary | ICD-10-CM | POA: Diagnosis not present

## 2024-04-23 DIAGNOSIS — G8929 Other chronic pain: Secondary | ICD-10-CM | POA: Diagnosis not present

## 2024-04-23 DIAGNOSIS — I5043 Acute on chronic combined systolic (congestive) and diastolic (congestive) heart failure: Secondary | ICD-10-CM | POA: Diagnosis not present

## 2024-04-23 DIAGNOSIS — I2510 Atherosclerotic heart disease of native coronary artery without angina pectoris: Secondary | ICD-10-CM | POA: Diagnosis not present

## 2024-04-23 DIAGNOSIS — R627 Adult failure to thrive: Secondary | ICD-10-CM | POA: Diagnosis not present

## 2024-04-26 DIAGNOSIS — L89614 Pressure ulcer of right heel, stage 4: Secondary | ICD-10-CM | POA: Diagnosis not present

## 2024-04-26 DIAGNOSIS — I739 Peripheral vascular disease, unspecified: Secondary | ICD-10-CM | POA: Diagnosis not present

## 2024-04-26 DIAGNOSIS — L89514 Pressure ulcer of right ankle, stage 4: Secondary | ICD-10-CM | POA: Diagnosis not present

## 2024-04-26 DIAGNOSIS — L89623 Pressure ulcer of left heel, stage 3: Secondary | ICD-10-CM | POA: Diagnosis not present

## 2024-04-28 DIAGNOSIS — I5033 Acute on chronic diastolic (congestive) heart failure: Secondary | ICD-10-CM | POA: Diagnosis not present

## 2024-04-28 DIAGNOSIS — L89514 Pressure ulcer of right ankle, stage 4: Secondary | ICD-10-CM | POA: Diagnosis not present

## 2024-04-29 DIAGNOSIS — D649 Anemia, unspecified: Secondary | ICD-10-CM | POA: Diagnosis not present

## 2024-04-29 DIAGNOSIS — L97222 Non-pressure chronic ulcer of left calf with fat layer exposed: Secondary | ICD-10-CM | POA: Diagnosis not present

## 2024-04-29 DIAGNOSIS — I87312 Chronic venous hypertension (idiopathic) with ulcer of left lower extremity: Secondary | ICD-10-CM | POA: Diagnosis not present

## 2024-04-29 DIAGNOSIS — I5022 Chronic systolic (congestive) heart failure: Secondary | ICD-10-CM | POA: Diagnosis not present

## 2024-04-30 DIAGNOSIS — G8929 Other chronic pain: Secondary | ICD-10-CM | POA: Diagnosis not present

## 2024-04-30 DIAGNOSIS — I5043 Acute on chronic combined systolic (congestive) and diastolic (congestive) heart failure: Secondary | ICD-10-CM | POA: Diagnosis not present

## 2024-04-30 DIAGNOSIS — R627 Adult failure to thrive: Secondary | ICD-10-CM | POA: Diagnosis not present

## 2024-04-30 DIAGNOSIS — I482 Chronic atrial fibrillation, unspecified: Secondary | ICD-10-CM | POA: Diagnosis not present

## 2024-04-30 DIAGNOSIS — I2510 Atherosclerotic heart disease of native coronary artery without angina pectoris: Secondary | ICD-10-CM | POA: Diagnosis not present

## 2024-05-04 DIAGNOSIS — R233 Spontaneous ecchymoses: Secondary | ICD-10-CM | POA: Diagnosis not present

## 2024-05-06 DIAGNOSIS — L97222 Non-pressure chronic ulcer of left calf with fat layer exposed: Secondary | ICD-10-CM | POA: Diagnosis not present

## 2024-05-06 DIAGNOSIS — I87312 Chronic venous hypertension (idiopathic) with ulcer of left lower extremity: Secondary | ICD-10-CM | POA: Diagnosis not present

## 2024-05-06 DIAGNOSIS — D649 Anemia, unspecified: Secondary | ICD-10-CM | POA: Diagnosis not present

## 2024-05-06 DIAGNOSIS — L89514 Pressure ulcer of right ankle, stage 4: Secondary | ICD-10-CM | POA: Diagnosis not present

## 2024-05-06 DIAGNOSIS — I5022 Chronic systolic (congestive) heart failure: Secondary | ICD-10-CM | POA: Diagnosis not present

## 2024-05-06 DIAGNOSIS — I5032 Chronic diastolic (congestive) heart failure: Secondary | ICD-10-CM | POA: Diagnosis not present

## 2024-05-06 DIAGNOSIS — I10 Essential (primary) hypertension: Secondary | ICD-10-CM | POA: Diagnosis not present

## 2024-05-06 DIAGNOSIS — I739 Peripheral vascular disease, unspecified: Secondary | ICD-10-CM | POA: Diagnosis not present

## 2024-05-06 DIAGNOSIS — L89623 Pressure ulcer of left heel, stage 3: Secondary | ICD-10-CM | POA: Diagnosis not present

## 2024-05-06 DIAGNOSIS — D508 Other iron deficiency anemias: Secondary | ICD-10-CM | POA: Diagnosis not present

## 2024-05-10 DIAGNOSIS — R233 Spontaneous ecchymoses: Secondary | ICD-10-CM | POA: Diagnosis not present

## 2024-05-10 DIAGNOSIS — G8929 Other chronic pain: Secondary | ICD-10-CM | POA: Diagnosis not present

## 2024-05-10 DIAGNOSIS — L89514 Pressure ulcer of right ankle, stage 4: Secondary | ICD-10-CM | POA: Diagnosis not present

## 2024-05-10 DIAGNOSIS — L89623 Pressure ulcer of left heel, stage 3: Secondary | ICD-10-CM | POA: Diagnosis not present

## 2024-05-10 DIAGNOSIS — I5032 Chronic diastolic (congestive) heart failure: Secondary | ICD-10-CM | POA: Diagnosis not present

## 2024-05-13 DIAGNOSIS — L97211 Non-pressure chronic ulcer of right calf limited to breakdown of skin: Secondary | ICD-10-CM | POA: Diagnosis not present

## 2024-05-13 DIAGNOSIS — D649 Anemia, unspecified: Secondary | ICD-10-CM | POA: Diagnosis not present

## 2024-05-13 DIAGNOSIS — I87312 Chronic venous hypertension (idiopathic) with ulcer of left lower extremity: Secondary | ICD-10-CM | POA: Diagnosis not present

## 2024-05-13 DIAGNOSIS — L89623 Pressure ulcer of left heel, stage 3: Secondary | ICD-10-CM | POA: Diagnosis not present

## 2024-05-13 DIAGNOSIS — I5022 Chronic systolic (congestive) heart failure: Secondary | ICD-10-CM | POA: Diagnosis not present

## 2024-05-13 DIAGNOSIS — I87311 Chronic venous hypertension (idiopathic) with ulcer of right lower extremity: Secondary | ICD-10-CM | POA: Diagnosis not present

## 2024-05-17 DIAGNOSIS — I5032 Chronic diastolic (congestive) heart failure: Secondary | ICD-10-CM | POA: Diagnosis not present

## 2024-05-18 DIAGNOSIS — I87311 Chronic venous hypertension (idiopathic) with ulcer of right lower extremity: Secondary | ICD-10-CM | POA: Diagnosis not present

## 2024-05-18 DIAGNOSIS — I87312 Chronic venous hypertension (idiopathic) with ulcer of left lower extremity: Secondary | ICD-10-CM | POA: Diagnosis not present

## 2024-05-18 DIAGNOSIS — L89623 Pressure ulcer of left heel, stage 3: Secondary | ICD-10-CM | POA: Diagnosis not present

## 2024-05-18 DIAGNOSIS — I5023 Acute on chronic systolic (congestive) heart failure: Secondary | ICD-10-CM | POA: Diagnosis not present

## 2024-05-20 DIAGNOSIS — I5032 Chronic diastolic (congestive) heart failure: Secondary | ICD-10-CM | POA: Diagnosis not present

## 2024-05-20 DIAGNOSIS — I87311 Chronic venous hypertension (idiopathic) with ulcer of right lower extremity: Secondary | ICD-10-CM | POA: Diagnosis not present

## 2024-05-20 DIAGNOSIS — D508 Other iron deficiency anemias: Secondary | ICD-10-CM | POA: Diagnosis not present

## 2024-05-20 DIAGNOSIS — D649 Anemia, unspecified: Secondary | ICD-10-CM | POA: Diagnosis not present

## 2024-05-20 DIAGNOSIS — I5022 Chronic systolic (congestive) heart failure: Secondary | ICD-10-CM | POA: Diagnosis not present

## 2024-05-20 DIAGNOSIS — I739 Peripheral vascular disease, unspecified: Secondary | ICD-10-CM | POA: Diagnosis not present

## 2024-05-20 DIAGNOSIS — I87333 Chronic venous hypertension (idiopathic) with ulcer and inflammation of bilateral lower extremity: Secondary | ICD-10-CM | POA: Diagnosis not present

## 2024-05-20 DIAGNOSIS — L89623 Pressure ulcer of left heel, stage 3: Secondary | ICD-10-CM | POA: Diagnosis not present

## 2024-05-20 DIAGNOSIS — N1831 Chronic kidney disease, stage 3a: Secondary | ICD-10-CM | POA: Diagnosis not present

## 2024-05-20 DIAGNOSIS — I87312 Chronic venous hypertension (idiopathic) with ulcer of left lower extremity: Secondary | ICD-10-CM | POA: Diagnosis not present

## 2024-05-24 DIAGNOSIS — L89623 Pressure ulcer of left heel, stage 3: Secondary | ICD-10-CM | POA: Diagnosis not present

## 2024-05-24 DIAGNOSIS — I87333 Chronic venous hypertension (idiopathic) with ulcer and inflammation of bilateral lower extremity: Secondary | ICD-10-CM | POA: Diagnosis not present

## 2024-05-27 DIAGNOSIS — I5022 Chronic systolic (congestive) heart failure: Secondary | ICD-10-CM | POA: Diagnosis not present

## 2024-05-27 DIAGNOSIS — L97312 Non-pressure chronic ulcer of right ankle with fat layer exposed: Secondary | ICD-10-CM | POA: Diagnosis not present

## 2024-05-27 DIAGNOSIS — D649 Anemia, unspecified: Secondary | ICD-10-CM | POA: Diagnosis not present

## 2024-05-27 DIAGNOSIS — L97211 Non-pressure chronic ulcer of right calf limited to breakdown of skin: Secondary | ICD-10-CM | POA: Diagnosis not present

## 2024-05-27 DIAGNOSIS — I87311 Chronic venous hypertension (idiopathic) with ulcer of right lower extremity: Secondary | ICD-10-CM | POA: Diagnosis not present

## 2024-05-28 DIAGNOSIS — R627 Adult failure to thrive: Secondary | ICD-10-CM | POA: Diagnosis not present

## 2024-05-28 DIAGNOSIS — I5043 Acute on chronic combined systolic (congestive) and diastolic (congestive) heart failure: Secondary | ICD-10-CM | POA: Diagnosis not present

## 2024-05-28 DIAGNOSIS — G8929 Other chronic pain: Secondary | ICD-10-CM | POA: Diagnosis not present

## 2024-05-28 DIAGNOSIS — I482 Chronic atrial fibrillation, unspecified: Secondary | ICD-10-CM | POA: Diagnosis not present

## 2024-05-28 DIAGNOSIS — I2510 Atherosclerotic heart disease of native coronary artery without angina pectoris: Secondary | ICD-10-CM | POA: Diagnosis not present

## 2024-06-01 DIAGNOSIS — I5032 Chronic diastolic (congestive) heart failure: Secondary | ICD-10-CM | POA: Diagnosis not present

## 2024-06-01 DIAGNOSIS — R140 Abdominal distension (gaseous): Secondary | ICD-10-CM | POA: Diagnosis not present

## 2024-06-01 DIAGNOSIS — G8929 Other chronic pain: Secondary | ICD-10-CM | POA: Diagnosis not present

## 2024-06-03 DIAGNOSIS — D508 Other iron deficiency anemias: Secondary | ICD-10-CM | POA: Diagnosis not present

## 2024-06-03 DIAGNOSIS — I87312 Chronic venous hypertension (idiopathic) with ulcer of left lower extremity: Secondary | ICD-10-CM | POA: Diagnosis not present

## 2024-06-03 DIAGNOSIS — I87311 Chronic venous hypertension (idiopathic) with ulcer of right lower extremity: Secondary | ICD-10-CM | POA: Diagnosis not present

## 2024-06-03 DIAGNOSIS — L89623 Pressure ulcer of left heel, stage 3: Secondary | ICD-10-CM | POA: Diagnosis not present

## 2024-06-03 DIAGNOSIS — I739 Peripheral vascular disease, unspecified: Secondary | ICD-10-CM | POA: Diagnosis not present

## 2024-06-03 DIAGNOSIS — D649 Anemia, unspecified: Secondary | ICD-10-CM | POA: Diagnosis not present

## 2024-06-03 DIAGNOSIS — I5022 Chronic systolic (congestive) heart failure: Secondary | ICD-10-CM | POA: Diagnosis not present

## 2024-06-03 DIAGNOSIS — I5032 Chronic diastolic (congestive) heart failure: Secondary | ICD-10-CM | POA: Diagnosis not present

## 2024-06-03 DIAGNOSIS — L89514 Pressure ulcer of right ankle, stage 4: Secondary | ICD-10-CM | POA: Diagnosis not present

## 2024-06-03 DIAGNOSIS — I10 Essential (primary) hypertension: Secondary | ICD-10-CM | POA: Diagnosis not present

## 2024-06-07 DIAGNOSIS — I87312 Chronic venous hypertension (idiopathic) with ulcer of left lower extremity: Secondary | ICD-10-CM | POA: Diagnosis not present

## 2024-06-07 DIAGNOSIS — L89623 Pressure ulcer of left heel, stage 3: Secondary | ICD-10-CM | POA: Diagnosis not present

## 2024-06-08 DIAGNOSIS — J90 Pleural effusion, not elsewhere classified: Secondary | ICD-10-CM | POA: Diagnosis not present

## 2024-06-08 DIAGNOSIS — I517 Cardiomegaly: Secondary | ICD-10-CM | POA: Diagnosis not present

## 2024-06-08 DIAGNOSIS — Z96641 Presence of right artificial hip joint: Secondary | ICD-10-CM | POA: Diagnosis not present

## 2024-06-08 DIAGNOSIS — I5032 Chronic diastolic (congestive) heart failure: Secondary | ICD-10-CM | POA: Diagnosis not present

## 2024-06-08 DIAGNOSIS — M25551 Pain in right hip: Secondary | ICD-10-CM | POA: Diagnosis not present

## 2024-06-08 DIAGNOSIS — M6281 Muscle weakness (generalized): Secondary | ICD-10-CM | POA: Diagnosis not present

## 2024-06-09 NOTE — Progress Notes (Unsigned)
 Established Patient Office Visit  Subjective:  Patient ID: [name] [name], female    DOB: [birth year]  Age: 89 y.o. MRN: [id number]  Chief Complaint  Patient presents with   Follow-up    1 month follow up    Pt here today for her 1 month.  She is feeling about the same as at her last appointment, says that she is feeling exhausted and weak.  Blood pressure is still very elevated today  She is taking her medications according to her report as well as her sister.     No other concerns at this time.   Past Medical History:  Diagnosis Date   AKI (acute kidney injury) (HCC) 02/20/2023   Atrial fibrillation (HCC)    Basal cell carcinoma 2021   L temple   Chronic back pain    Congestive heart failure (CHF) (HCC)    History of total hip replacement (Bilateral) 01/12/2020   Left hip replacement done in 2019.  Right hip replacement done in 2012.   Hypertension    Hyponatremia 06/11/2020   Melena 02/21/2023   NSTEMI (non-ST elevated myocardial infarction) (HCC) 06/11/2020   Pharmacologic therapy 01/12/2020   Problems influencing health status 01/12/2020   Scoliosis    Upper GI bleeding 02/21/2023    Past Surgical History:  Procedure Laterality Date   ABDOMINAL SURGERY     bilateral hip replacement     CORONARY STENT INTERVENTION N/A 06/12/2020   Procedure: CORONARY STENT INTERVENTION;  Surgeon: Darron Deatrice LABOR, MD;  Location: [facility];  Service: Cardiovascular;  Laterality: N/A;  LAD   ESOPHAGOGASTRODUODENOSCOPY (EGD) WITH PROPOFOL  N/A 02/24/2023   Procedure: ESOPHAGOGASTRODUODENOSCOPY (EGD) WITH PROPOFOL ;  Surgeon: Jinny Carmine, MD;  Location: [facility];  Service: Endoscopy;  Laterality: N/A;   LEFT HEART CATH AND CORONARY ANGIOGRAPHY N/A 06/12/2020   Procedure: LEFT HEART CATH AND CORONARY ANGIOGRAPHY;  Surgeon: Darron Deatrice LABOR, MD;  Location: [facility];  Service: Cardiovascular;  Laterality: N/A;   LOWER EXTREMITY ANGIOGRAPHY Right 02/25/2023    Procedure: Lower Extremity Angiography;  Surgeon: Jama Cordella MATSU, MD;  Location: [facility];  Service: Cardiovascular;  Laterality: Right;   TONSILLECTOMY      Social History   Socioeconomic History   Marital status: Single    Spouse name: Not on file   Number of children: 0   Years of education: Not on file   Highest education level: Not on file  Occupational History   Not on file  Tobacco Use   Smoking status: Former    Current packs/day: 0.00    Average packs/day: 0.5 packs/day for 68.0 years (34.0 ttl pk-yrs)    Types: Cigarettes    Start date: 06/11/1952    Quit date: 06/11/2020    Years since quitting: 3.9   Smokeless tobacco: Never  Substance and Sexual Activity   Alcohol use: Not Currently   Drug use: Not Currently   Sexual activity: Not on file  Other Topics Concern   Not on file  Social History Narrative   Not on file   Social Drivers of Health   Financial Resource Strain: Not on file  Food Insecurity: No Food Insecurity (03/17/2024)   Hunger Vital Sign    Worried About Running Out of Food in the Last Year: Never true    Ran Out of Food in the Last Year: Never true  Transportation Needs: No Transportation Needs (03/17/2024)   PRAPARE - Administrator, Civil Service (Medical):  No    Lack of Transportation (Non-Medical): No  Physical Activity: Insufficiently Active (12/25/2022)   Exercise Vital Sign    Days of Exercise per Week: 7 days    Minutes of Exercise per Session: 20 min  Stress: Stress Concern Present (12/25/2022)   [employer] of Occupational Health - Occupational Stress Questionnaire    Feeling of Stress : Very much  Social Connections: Socially Isolated (03/17/2024)   Social Connection and Isolation Panel    Frequency of Communication with Friends and Family: More than three times a week    Frequency of Social Gatherings with Friends and Family: Never    Attends Religious Services: Never    Database administrator or  Organizations: No    Attends Banker Meetings: Never    Marital Status: Never married  Intimate Partner Violence: Not At Risk (03/17/2024)   Humiliation, Afraid, Rape, and Kick questionnaire    Fear of Current or Ex-Partner: No    Emotionally Abused: No    Physically Abused: No    Sexually Abused: No    Family History  Problem Relation Age of Onset   Breast cancer Mother    CVA Father    Heart disease Father    Arthritis/Rheumatoid Sister    Breast cancer Paternal Grandmother     Allergies  Allergen Reactions   Spironolactone Anaphylaxis   Fluogen [Influenza Virus Vaccine] Hives   Statins     Dizziness & weakness   Sulfa Antibiotics Other (See Comments)    Unknown, happened as a child    Review of Systems  All other systems reviewed and are negative.      Objective:   BP (!) 166/88   Pulse 77   Ht 5' 6 (1.676 m)   Wt 95 lb 9.6 oz (43.4 kg)   SpO2 97%   BMI 15.43 kg/m   Vitals:   01/28/24 1015  BP: (!) 166/88  Pulse: 77  Height: 5' 6 (1.676 m)  Weight: 95 lb 9.6 oz (43.4 kg)  SpO2: 97%  BMI (Calculated): 15.44    Physical Exam Vitals and nursing note reviewed.  Constitutional:      Appearance: Normal appearance. She is normal weight.  HENT:     Head: Normocephalic.  Eyes:     Extraocular Movements: Extraocular movements intact.     Conjunctiva/sclera: Conjunctivae normal.     Pupils: Pupils are equal, round, and reactive to light.  Cardiovascular:     Rate and Rhythm: Normal rate and regular rhythm.  Pulmonary:     Effort: Pulmonary effort is normal.  Neurological:     General: No focal deficit present.     Mental Status: She is alert and oriented to person, place, and time. Mental status is at baseline.  Psychiatric:        Mood and Affect: Mood normal.        Behavior: Behavior normal.        Thought Content: Thought content normal.        Judgment: Judgment normal.      Results for orders placed or performed in visit on  01/28/24  POC Influenza A&B (Binax test)  Result Value Ref Range   Influenza A, POC Negative Negative   Influenza B, POC Negative Negative    Recent Results (from the past 2160 hours)  CBC     Status: Abnormal   Collection Time: 03/16/24 10:42 AM  Result Value Ref Range   WBC 6.6 4.0 - 10.5  K/uL   RBC 2.97 (L) 3.87 - 5.11 MIL/uL   Hemoglobin 7.8 (L) 12.0 - 15.0 g/dL   HCT 73.0 (L) 63.9 - 53.9 %   MCV 90.6 80.0 - 100.0 fL   MCH 26.3 26.0 - 34.0 pg   MCHC 29.0 (L) 30.0 - 36.0 g/dL   RDW 84.3 (H) 88.4 - 84.4 %   Platelets 286 150 - 400 K/uL   nRBC 0.0 0.0 - 0.2 %    Comment: Performed at [facility] [address] [place], [place] [address]  Troponin I (High Sensitivity)     Status: Abnormal   Collection Time: 03/16/24 10:42 AM  Result Value Ref Range   Troponin I (High Sensitivity) 24 (H) <18 ng/L    Comment: (NOTE) Elevated high sensitivity troponin I (hsTnI) values and significant  changes across serial measurements may suggest ACS but many other  chronic and acute conditions are known to elevate hsTnI results.  Refer to the Links section for chest pain algorithms and additional  guidance. Performed at [facility], [address] [place], [place] [address]   Comprehensive metabolic panel     Status: Abnormal   Collection Time: 03/16/24 10:42 AM  Result Value Ref Range   Sodium 136 135 - 145 mmol/L   Potassium 4.5 3.5 - 5.1 mmol/L   Chloride 106 98 - 111 mmol/L   CO2 21 (L) 22 - 32 mmol/L   Glucose, Bld 107 (H) 70 - 99 mg/dL    Comment: Glucose reference range applies only to samples taken after fasting for at least 8 hours.   BUN 25 (H) 8 - 23 mg/dL   Creatinine, Ser 8.55 (H) 0.44 - 1.00 mg/dL   Calcium  8.1 (L) 8.9 - 10.3 mg/dL   Total Protein 6.2 (L) 6.5 - 8.1 g/dL   Albumin 3.2 (L) 3.5 - 5.0 g/dL   AST 24 15 - 41 U/L   ALT 26 0 - 44 U/L   Alkaline Phosphatase 111 38 - 126 U/L   Total Bilirubin 0.8 0.0 - 1.2 mg/dL   GFR, Estimated 35  (L) >60 mL/min    Comment: (NOTE) Calculated using the CKD-EPI Creatinine Equation (2021)    Anion gap 9 5 - 15    Comment: Performed at [facility], [address] [place], [place] [address]  Brain natriuretic peptide     Status: Abnormal   Collection Time: 03/16/24 10:42 AM  Result Value Ref Range   B Natriuretic Peptide 919.8 (H) 0.0 - 100.0 pg/mL    Comment: Performed at [facility], [address] [place], [place] [address]  TSH     Status: None   Collection Time: 03/16/24 10:42 AM  Result Value Ref Range   TSH 0.441 0.350 - 4.500 uIU/mL    Comment: Performed by a 3rd Generation assay with a functional sensitivity of <=0.01 uIU/mL. Performed at [facility], [address] [place], [place] [address]   Iron  and TIBC     Status: Abnormal   Collection Time: 03/16/24 10:42 AM  Result Value Ref Range   Iron  19 (L) 28 - 170 ug/dL   TIBC 575 749 - 549 ug/dL   Saturation Ratios 5 (L) 10.4 - 31.8 %   UIBC 405 ug/dL    Comment: Performed at [name], [address] [place], [place] [address]  Ferritin     Status: None   Collection Time: 03/16/24 10:42 AM  Result Value Ref Range  Ferritin 22 11 - 307 ng/mL    Comment: Performed at [facility], [address] [place], [place] [address]  Troponin I (High Sensitivity)     Status: Abnormal   Collection Time: 03/16/24 12:57 PM  Result Value Ref Range   Troponin I (High Sensitivity) 22 (H) <18 ng/L    Comment: (NOTE) Elevated high sensitivity troponin I (hsTnI) values and significant  changes across serial measurements may suggest ACS but many other  chronic and acute conditions are known to elevate hsTnI results.  Refer to the Links section for chest pain algorithms and additional  guidance. Performed at [facility], [address] [place], [place] [address]   ECHOCARDIOGRAM COMPLETE     Status: None   Collection Time: 03/16/24 11:12 PM  Result Value Ref  Range   BP 158/90 mmHg   Ao pk vel 1.68 m/s   AV Area VTI 0.79 cm2   AR max vel 1.08 cm2   AV Mean grad 6.1 mmHg   AV Peak grad 11.3 mmHg   S' Lateral 2.00 cm   AV Area mean vel 1.08 cm2   Est EF 60 - 65%   CBC     Status: Abnormal   Collection Time: 03/17/24  4:56 AM  Result Value Ref Range   WBC 7.5 4.0 - 10.5 K/uL   RBC 2.62 (L) 3.87 - 5.11 MIL/uL   Hemoglobin 6.9 (L) 12.0 - 15.0 g/dL   HCT 76.5 (L) 63.9 - 53.9 %   MCV 89.3 80.0 - 100.0 fL   MCH 26.3 26.0 - 34.0 pg   MCHC 29.5 (L) 30.0 - 36.0 g/dL   RDW 84.3 (H) 88.4 - 84.4 %   Platelets 261 150 - 400 K/uL   nRBC 0.0 0.0 - 0.2 %    Comment: Performed at [facility], [address] [place], [place] [address]  Basic metabolic panel     Status: Abnormal   Collection Time: 03/17/24  4:56 AM  Result Value Ref Range   Sodium 139 135 - 145 mmol/L   Potassium 4.6 3.5 - 5.1 mmol/L   Chloride 106 98 - 111 mmol/L   CO2 26 22 - 32 mmol/L   Glucose, Bld 104 (H) 70 - 99 mg/dL    Comment: Glucose reference range applies only to samples taken after fasting for at least 8 hours.   BUN 28 (H) 8 - 23 mg/dL   Creatinine, Ser 8.63 (H) 0.44 - 1.00 mg/dL   Calcium  8.2 (L) 8.9 - 10.3 mg/dL   GFR, Estimated 38 (L) >60 mL/min    Comment: (NOTE) Calculated using the CKD-EPI Creatinine Equation (2021)    Anion gap 7 5 - 15    Comment: Performed at [facility], [address] [place], [place] [address]  Vitamin B12     Status: None   Collection Time: 03/17/24  4:56 AM  Result Value Ref Range   Vitamin B-12 693 180 - 914 pg/mL    Comment: (NOTE) This assay is not validated for testing neonatal or myeloproliferative syndrome specimens for Vitamin B12 levels. Performed at [facility], [address], [place], [place] [address]   Magnesium     Status: None   Collection Time: 03/17/24  4:56 AM  Result Value Ref Range   Magnesium 2.2 1.7 - 2.4 mg/dL    Comment: Performed at [facility], [address], [place], [place] [address]  Prepare RBC (crossmatch)  Status: None   Collection Time: 03/17/24  9:55 AM  Result Value Ref Range   Order Confirmation      ORDER PROCESSED BY BLOOD BANK Performed at [facility], [address] [place], [place] [address]   Type and screen [facility]     Status: None   Collection Time: 03/17/24 10:47 AM  Result Value Ref Range   ABO/RH(D) O POS    Antibody Screen NEG    Sample Expiration 03/20/2024,2359    Unit Number [id number]    Blood Component Type RED CELLS,LR    Unit division 00    Status of Unit ISSUED,FINAL    Transfusion Status OK TO TRANSFUSE    Crossmatch Result      Compatible Performed at [facility], [address] [place], [place] [address]   BPAM RBC     Status: None   Collection Time: 03/17/24 10:47 AM  Result Value Ref Range   ISSUE DATE / TIME [id number]    Blood Product Unit Number [id number]    PRODUCT CODE [id number]    Unit Type and Rh 5100    Blood Product Expiration Date 797493977640   CBC with Differential/Platelet     Status: Abnormal   Collection Time: 03/18/24  4:01 AM  Result Value Ref Range   WBC 6.0 4.0 - 10.5 K/uL   RBC 3.07 (L) 3.87 - 5.11 MIL/uL   Hemoglobin 8.3 (L) 12.0 - 15.0 g/dL   HCT 72.5 (L) 63.9 - 53.9 %   MCV 89.3 80.0 - 100.0 fL   MCH 27.0 26.0 - 34.0 pg   MCHC 30.3 30.0 - 36.0 g/dL   RDW 84.3 (H) 88.4 - 84.4 %   Platelets 264 150 - 400 K/uL   nRBC 0.3 (H) 0.0 - 0.2 %   Neutrophils Relative % 80 %   Neutro Abs 4.9 1.7 - 7.7 K/uL   Lymphocytes Relative 10 %   Lymphs Abs 0.6 (L) 0.7 - 4.0 K/uL   Monocytes Relative 8 %   Monocytes Absolute 0.5 0.1 - 1.0 K/uL   Eosinophils Relative 1 %   Eosinophils Absolute 0.1 0.0 - 0.5 K/uL   Basophils Relative 1 %   Basophils Absolute 0.0 0.0 - 0.1 K/uL   Immature Granulocytes 0 %   Abs Immature Granulocytes 0.02 0.00 - 0.07 K/uL    Comment: Performed at [facility], [address], [place], [place] [address]  Basic metabolic panel     Status: Abnormal   Collection Time: 03/18/24  4:01 AM  Result Value Ref Range   Sodium 137 135 - 145 mmol/L   Potassium 4.0 3.5 - 5.1 mmol/L   Chloride 104 98 - 111 mmol/L   CO2 24 22 - 32 mmol/L   Glucose, Bld 135 (H) 70 - 99 mg/dL    Comment: Glucose reference range applies only to samples taken after fasting for at least 8 hours.   BUN 25 (H) 8 - 23 mg/dL   Creatinine, Ser 8.60 (H) 0.44 - 1.00 mg/dL   Calcium  7.8 (L) 8.9 - 10.3 mg/dL   GFR, Estimated 37 (L) >60 mL/min    Comment: (NOTE) Calculated using the CKD-EPI Creatinine Equation (2021)    Anion gap 9 5 - 15    Comment: Performed at [facility], [address] [place], [place] [address]  CBC with Differential/Platelet     Status: Abnormal   Collection Time: 03/19/24  6:47 AM  Result Value Ref  Range   WBC 8.0 4.0 - 10.5 K/uL   RBC 3.12 (L) 3.87 - 5.11 MIL/uL   Hemoglobin 8.5 (L) 12.0 - 15.0 g/dL   HCT 72.3 (L) 63.9 - 53.9 %   MCV 88.5 80.0 - 100.0 fL   MCH 27.2 26.0 - 34.0 pg   MCHC 30.8 30.0 - 36.0 g/dL   RDW 84.2 (H) 88.4 - 84.4 %   Platelets 278 150 - 400 K/uL   nRBC 0.4 (H) 0.0 - 0.2 %   Neutrophils Relative % 80 %   Neutro Abs 6.4 1.7 - 7.7 K/uL   Lymphocytes Relative 8 %   Lymphs Abs 0.7 0.7 - 4.0 K/uL   Monocytes Relative 11 %   Monocytes Absolute 0.8 0.1 - 1.0 K/uL   Eosinophils Relative 1 %   Eosinophils Absolute 0.0 0.0 - 0.5 K/uL   Basophils Relative 0 %   Basophils Absolute 0.0 0.0 - 0.1 K/uL   Immature Granulocytes 0 %   Abs Immature Granulocytes 0.03 0.00 - 0.07 K/uL    Comment: Performed at [facility], [address] [place], [place] [address]  Basic metabolic panel     Status: Abnormal   Collection Time: 03/19/24  6:47 AM  Result Value Ref Range   Sodium 135 135 - 145 mmol/L   Potassium 4.4 3.5 - 5.1 mmol/L   Chloride 103 98 - 111 mmol/L   CO2 26 22 - 32 mmol/L   Glucose, Bld 107 (H) 70 - 99 mg/dL    Comment:  Glucose reference range applies only to samples taken after fasting for at least 8 hours.   BUN 30 (H) 8 - 23 mg/dL   Creatinine, Ser 8.74 (H) 0.44 - 1.00 mg/dL   Calcium  8.1 (L) 8.9 - 10.3 mg/dL   GFR, Estimated 42 (L) >60 mL/min    Comment: (NOTE) Calculated using the CKD-EPI Creatinine Equation (2021)    Anion gap 6 5 - 15    Comment: Performed at [facility], [address] [place], [place] [address]  CBC with Differential/Platelet     Status: Abnormal   Collection Time: 03/20/24  3:46 AM  Result Value Ref Range   WBC 8.6 4.0 - 10.5 K/uL   RBC 3.06 (L) 3.87 - 5.11 MIL/uL   Hemoglobin 8.2 (L) 12.0 - 15.0 g/dL   HCT 72.8 (L) 63.9 - 53.9 %   MCV 88.6 80.0 - 100.0 fL   MCH 26.8 26.0 - 34.0 pg   MCHC 30.3 30.0 - 36.0 g/dL   RDW 83.9 (H) 88.4 - 84.4 %   Platelets 252 150 - 400 K/uL   nRBC 0.0 0.0 - 0.2 %   Neutrophils Relative % 81 %   Neutro Abs 7.0 1.7 - 7.7 K/uL   Lymphocytes Relative 8 %   Lymphs Abs 0.7 0.7 - 4.0 K/uL   Monocytes Relative 9 %   Monocytes Absolute 0.7 0.1 - 1.0 K/uL   Eosinophils Relative 1 %   Eosinophils Absolute 0.1 0.0 - 0.5 K/uL   Basophils Relative 0 %   Basophils Absolute 0.0 0.0 - 0.1 K/uL   Immature Granulocytes 1 %   Abs Immature Granulocytes 0.04 0.00 - 0.07 K/uL    Comment: Performed at [facility], [address] [place], [place] [address]  Basic metabolic panel     Status: Abnormal   Collection Time: 03/20/24  3:46 AM  Result Value Ref Range   Sodium 137 135 - 145 mmol/L  Potassium 4.4 3.5 - 5.1 mmol/L   Chloride 101 98 - 111 mmol/L   CO2 29 22 - 32 mmol/L   Glucose, Bld 99 70 - 99 mg/dL    Comment: Glucose reference range applies only to samples taken after fasting for at least 8 hours.   BUN 34 (H) 8 - 23 mg/dL   Creatinine, Ser 8.88 (H) 0.44 - 1.00 mg/dL   Calcium  8.0 (L) 8.9 - 10.3 mg/dL   GFR, Estimated 48 (L) >60 mL/min    Comment: (NOTE) Calculated using the CKD-EPI Creatinine Equation (2021)    Anion  gap 7 5 - 15    Comment: Performed at [facility], [address] [place], [place] [address]  CBC with Differential/Platelet     Status: Abnormal   Collection Time: 03/21/24  3:04 AM  Result Value Ref Range   WBC 10.5 4.0 - 10.5 K/uL   RBC 3.10 (L) 3.87 - 5.11 MIL/uL   Hemoglobin 8.4 (L) 12.0 - 15.0 g/dL   HCT 71.9 (L) 63.9 - 53.9 %   MCV 90.3 80.0 - 100.0 fL   MCH 27.1 26.0 - 34.0 pg   MCHC 30.0 30.0 - 36.0 g/dL   RDW 83.4 (H) 88.4 - 84.4 %   Platelets 278 150 - 400 K/uL   nRBC 0.0 0.0 - 0.2 %   Neutrophils Relative % 84 %   Neutro Abs 8.8 (H) 1.7 - 7.7 K/uL   Lymphocytes Relative 7 %   Lymphs Abs 0.7 0.7 - 4.0 K/uL   Monocytes Relative 8 %   Monocytes Absolute 0.9 0.1 - 1.0 K/uL   Eosinophils Relative 1 %   Eosinophils Absolute 0.1 0.0 - 0.5 K/uL   Basophils Relative 0 %   Basophils Absolute 0.0 0.0 - 0.1 K/uL   Immature Granulocytes 0 %   Abs Immature Granulocytes 0.04 0.00 - 0.07 K/uL    Comment: Performed at [facility], [address] [place], [place] [address]  Basic metabolic panel     Status: Abnormal   Collection Time: 03/21/24  3:04 AM  Result Value Ref Range   Sodium 136 135 - 145 mmol/L   Potassium 4.5 3.5 - 5.1 mmol/L   Chloride 98 98 - 111 mmol/L   CO2 30 22 - 32 mmol/L   Glucose, Bld 122 (H) 70 - 99 mg/dL    Comment: Glucose reference range applies only to samples taken after fasting for at least 8 hours.   BUN 38 (H) 8 - 23 mg/dL   Creatinine, Ser 8.93 (H) 0.44 - 1.00 mg/dL   Calcium  8.1 (L) 8.9 - 10.3 mg/dL   GFR, Estimated 51 (L) >60 mL/min    Comment: (NOTE) Calculated using the CKD-EPI Creatinine Equation (2021)    Anion gap 8 5 - 15    Comment: Performed at [facility], [address] [place], [place] [address]  CBC with Differential/Platelet     Status: Abnormal   Collection Time: 03/22/24  5:05 AM  Result Value Ref Range   WBC 11.6 (H) 4.0 - 10.5 K/uL   RBC 3.21 (L) 3.87 - 5.11 MIL/uL   Hemoglobin 8.8 (L)  12.0 - 15.0 g/dL   HCT 70.6 (L) 63.9 - 53.9 %   MCV 91.3 80.0 - 100.0 fL   MCH 27.4 26.0 - 34.0 pg   MCHC 30.0 30.0 - 36.0 g/dL   RDW 83.0 (H) 88.4 - 84.4 %   Platelets 268 150 - 400 K/uL  nRBC 0.0 0.0 - 0.2 %   Neutrophils Relative % 80 %   Neutro Abs 9.4 (H) 1.7 - 7.7 K/uL   Lymphocytes Relative 8 %   Lymphs Abs 1.0 0.7 - 4.0 K/uL   Monocytes Relative 10 %   Monocytes Absolute 1.1 (H) 0.1 - 1.0 K/uL   Eosinophils Relative 1 %   Eosinophils Absolute 0.1 0.0 - 0.5 K/uL   Basophils Relative 0 %   Basophils Absolute 0.0 0.0 - 0.1 K/uL   Immature Granulocytes 1 %   Abs Immature Granulocytes 0.06 0.00 - 0.07 K/uL    Comment: Performed at [facility], [address] [place], [place] [address]  Basic metabolic panel     Status: Abnormal   Collection Time: 03/22/24  5:05 AM  Result Value Ref Range   Sodium 136 135 - 145 mmol/L   Potassium 5.0 3.5 - 5.1 mmol/L   Chloride 99 98 - 111 mmol/L   CO2 30 22 - 32 mmol/L   Glucose, Bld 106 (H) 70 - 99 mg/dL    Comment: Glucose reference range applies only to samples taken after fasting for at least 8 hours.   BUN 45 (H) 8 - 23 mg/dL   Creatinine, Ser 8.91 (H) 0.44 - 1.00 mg/dL   Calcium  8.4 (L) 8.9 - 10.3 mg/dL   GFR, Estimated 50 (L) >60 mL/min    Comment: (NOTE) Calculated using the CKD-EPI Creatinine Equation (2021)    Anion gap 7 5 - 15    Comment: Performed at [facility], [address] [place], [place] [address]  CBC with Differential/Platelet     Status: Abnormal   Collection Time: 03/23/24  6:12 AM  Result Value Ref Range   WBC 8.4 4.0 - 10.5 K/uL   RBC 2.90 (L) 3.87 - 5.11 MIL/uL   Hemoglobin 7.9 (L) 12.0 - 15.0 g/dL   HCT 73.4 (L) 63.9 - 53.9 %   MCV 91.4 80.0 - 100.0 fL   MCH 27.2 26.0 - 34.0 pg   MCHC 29.8 (L) 30.0 - 36.0 g/dL   RDW 82.4 (H) 88.4 - 84.4 %   Platelets 261 150 - 400 K/uL   nRBC 0.0 0.0 - 0.2 %   Neutrophils Relative % 81 %   Neutro Abs 6.8 1.7 - 7.7 K/uL   Lymphocytes Relative  9 %   Lymphs Abs 0.8 0.7 - 4.0 K/uL   Monocytes Relative 8 %   Monocytes Absolute 0.7 0.1 - 1.0 K/uL   Eosinophils Relative 1 %   Eosinophils Absolute 0.1 0.0 - 0.5 K/uL   Basophils Relative 0 %   Basophils Absolute 0.0 0.0 - 0.1 K/uL   Immature Granulocytes 1 %   Abs Immature Granulocytes 0.04 0.00 - 0.07 K/uL    Comment: Performed at [facility], [address] [place], [place] [address]  Basic metabolic panel     Status: Abnormal   Collection Time: 03/23/24  6:12 AM  Result Value Ref Range   Sodium 136 135 - 145 mmol/L   Potassium 4.7 3.5 - 5.1 mmol/L   Chloride 97 (L) 98 - 111 mmol/L   CO2 30 22 - 32 mmol/L   Glucose, Bld 98 70 - 99 mg/dL    Comment: Glucose reference range applies only to samples taken after fasting for at least 8 hours.   BUN 41 (H) 8 - 23 mg/dL   Creatinine, Ser 9.01 0.44 - 1.00 mg/dL   Calcium  8.2 (L) 8.9 - 10.3  mg/dL   GFR, Estimated 56 (L) >60 mL/min    Comment: (NOTE) Calculated using the CKD-EPI Creatinine Equation (2021)    Anion gap 9 5 - 15    Comment: Performed at [facility], [address] [place], [place] [address]  CBC with Differential/Platelet     Status: Abnormal   Collection Time: 03/24/24  3:23 AM  Result Value Ref Range   WBC 8.5 4.0 - 10.5 K/uL   RBC 2.98 (L) 3.87 - 5.11 MIL/uL   Hemoglobin 8.2 (L) 12.0 - 15.0 g/dL   HCT 72.2 (L) 63.9 - 53.9 %   MCV 93.0 80.0 - 100.0 fL   MCH 27.5 26.0 - 34.0 pg   MCHC 29.6 (L) 30.0 - 36.0 g/dL   RDW 82.1 (H) 88.4 - 84.4 %   Platelets 282 150 - 400 K/uL   nRBC 0.0 0.0 - 0.2 %   Neutrophils Relative % 83 %   Neutro Abs 7.1 1.7 - 7.7 K/uL   Lymphocytes Relative 7 %   Lymphs Abs 0.6 (L) 0.7 - 4.0 K/uL   Monocytes Relative 8 %   Monocytes Absolute 0.7 0.1 - 1.0 K/uL   Eosinophils Relative 1 %   Eosinophils Absolute 0.1 0.0 - 0.5 K/uL   Basophils Relative 0 %   Basophils Absolute 0.0 0.0 - 0.1 K/uL   Immature Granulocytes 1 %   Abs Immature Granulocytes 0.04 0.00 -  0.07 K/uL    Comment: Performed at [facility], [address] [place], [place] [address]  Basic metabolic panel     Status: Abnormal   Collection Time: 03/24/24  3:23 AM  Result Value Ref Range   Sodium 135 135 - 145 mmol/L   Potassium 4.2 3.5 - 5.1 mmol/L   Chloride 96 (L) 98 - 111 mmol/L   CO2 31 22 - 32 mmol/L   Glucose, Bld 111 (H) 70 - 99 mg/dL    Comment: Glucose reference range applies only to samples taken after fasting for at least 8 hours.   BUN 41 (H) 8 - 23 mg/dL   Creatinine, Ser 9.07 0.44 - 1.00 mg/dL   Calcium  7.9 (L) 8.9 - 10.3 mg/dL   GFR, Estimated >39 >39 mL/min    Comment: (NOTE) Calculated using the CKD-EPI Creatinine Equation (2021)    Anion gap 8 5 - 15    Comment: Performed at [facility], [address] [address] [place], [place] [address]  CBC     Status: Abnormal   Collection Time: 03/25/24  4:32 AM  Result Value Ref Range   WBC 8.9 4.0 - 10.5 K/uL   RBC 2.93 (L) 3.87 - 5.11 MIL/uL   Hemoglobin 8.1 (L) 12.0 - 15.0 g/dL   HCT 72.8 (L) 63.9 - 53.9 %   MCV 92.5 80.0 - 100.0 fL   MCH 27.6 26.0 - 34.0 pg   MCHC 29.9 (L) 30.0 - 36.0 g/dL   RDW 81.7 (H) 88.4 - 84.4 %   Platelets 289 150 - 400 K/uL   nRBC 0.0 0.0 - 0.2 %    Comment: Performed at [facility], [address] [place], [place] [address]  Basic metabolic panel with GFR     Status: Abnormal   Collection Time: 03/25/24  4:32 AM  Result Value Ref Range   Sodium 136 135 - 145 mmol/L   Potassium 4.1 3.5 - 5.1 mmol/L   Chloride 96 (L) 98 - 111 mmol/L   CO2 32 22 - 32 mmol/L  Glucose, Bld 102 (H) 70 - 99 mg/dL    Comment: Glucose reference range applies only to samples taken after fasting for at least 8 hours.   BUN 44 (H) 8 - 23 mg/dL   Creatinine, Ser 9.03 0.44 - 1.00 mg/dL   Calcium  8.3 (L) 8.9 - 10.3 mg/dL   GFR, Estimated 57 (L) >60 mL/min    Comment: (NOTE) Calculated using the CKD-EPI Creatinine Equation (2021)    Anion gap 8 5 - 15    Comment: Performed at  [facility], [address] [place], [place] [address]  CBC     Status: Abnormal   Collection Time: 03/26/24  5:01 AM  Result Value Ref Range   WBC 7.9 4.0 - 10.5 K/uL   RBC 3.01 (L) 3.87 - 5.11 MIL/uL   Hemoglobin 8.2 (L) 12.0 - 15.0 g/dL   HCT 71.7 (L) 63.9 - 53.9 %   MCV 93.7 80.0 - 100.0 fL   MCH 27.2 26.0 - 34.0 pg   MCHC 29.1 (L) 30.0 - 36.0 g/dL   RDW 81.4 (H) 88.4 - 84.4 %   Platelets 311 150 - 400 K/uL   nRBC 0.0 0.0 - 0.2 %    Comment: Performed at [facility], [address], [place], [place] [address]  Basic metabolic panel with GFR     Status: Abnormal   Collection Time: 03/26/24  5:01 AM  Result Value Ref Range   Sodium 135 135 - 145 mmol/L   Potassium 4.9 3.5 - 5.1 mmol/L   Chloride 93 (L) 98 - 111 mmol/L   CO2 33 (H) 22 - 32 mmol/L   Glucose, Bld 104 (H) 70 - 99 mg/dL    Comment: Glucose reference range applies only to samples taken after fasting for at least 8 hours.   BUN 48 (H) 8 - 23 mg/dL   Creatinine, Ser 9.03 0.44 - 1.00 mg/dL   Calcium  8.4 (L) 8.9 - 10.3 mg/dL   GFR, Estimated 57 (L) >60 mL/min    Comment: (NOTE) Calculated using the CKD-EPI Creatinine Equation (2021)    Anion gap 9 5 - 15    Comment: Performed at [facility], [address] [place], [place] [address]  CBC     Status: Abnormal   Collection Time: 03/27/24  6:22 AM  Result Value Ref Range   WBC 7.7 4.0 - 10.5 K/uL   RBC 3.12 (L) 3.87 - 5.11 MIL/uL   Hemoglobin 8.5 (L) 12.0 - 15.0 g/dL   HCT 71.4 (L) 63.9 - 53.9 %   MCV 91.3 80.0 - 100.0 fL   MCH 27.2 26.0 - 34.0 pg   MCHC 29.8 (L) 30.0 - 36.0 g/dL   RDW 81.3 (H) 88.4 - 84.4 %   Platelets 325 150 - 400 K/uL   nRBC 0.0 0.0 - 0.2 %    Comment: Performed at [facility], [address], [place], [place] [address]  Basic metabolic panel with GFR     Status: Abnormal   Collection Time: 03/27/24  6:22 AM  Result Value Ref Range   Sodium 134 (L) 135 - 145 mmol/L   Potassium 4.3 3.5 - 5.1  mmol/L   Chloride 94 (L) 98 - 111 mmol/L   CO2 31 22 - 32 mmol/L   Glucose, Bld 107 (H) 70 - 99 mg/dL    Comment: Glucose reference range applies only to samples taken after fasting for at least 8 hours.   BUN 48 (H) 8 -  23 mg/dL   Creatinine, Ser 9.13 0.44 - 1.00 mg/dL   Calcium  8.3 (L) 8.9 - 10.3 mg/dL   GFR, Estimated >39 >39 mL/min    Comment: (NOTE) Calculated using the CKD-EPI Creatinine Equation (2021)    Anion gap 9 5 - 15    Comment: Performed at [facility], [address] [place], [place] [address]  CBC     Status: Abnormal   Collection Time: 03/28/24  4:48 AM  Result Value Ref Range   WBC 6.2 4.0 - 10.5 K/uL   RBC 3.11 (L) 3.87 - 5.11 MIL/uL   Hemoglobin 8.7 (L) 12.0 - 15.0 g/dL   HCT 71.1 (L) 63.9 - 53.9 %   MCV 92.6 80.0 - 100.0 fL   MCH 28.0 26.0 - 34.0 pg   MCHC 30.2 30.0 - 36.0 g/dL   RDW 81.3 (H) 88.4 - 84.4 %   Platelets 324 150 - 400 K/uL   nRBC 0.0 0.0 - 0.2 %    Comment: Performed at [facility], [address] [place], [place] [address]  Basic metabolic panel with GFR     Status: Abnormal   Collection Time: 03/28/24  4:48 AM  Result Value Ref Range   Sodium 134 (L) 135 - 145 mmol/L   Potassium 4.5 3.5 - 5.1 mmol/L   Chloride 94 (L) 98 - 111 mmol/L   CO2 30 22 - 32 mmol/L   Glucose, Bld 97 70 - 99 mg/dL    Comment: Glucose reference range applies only to samples taken after fasting for at least 8 hours.   BUN 56 (H) 8 - 23 mg/dL   Creatinine, Ser 8.94 (H) 0.44 - 1.00 mg/dL   Calcium  8.2 (L) 8.9 - 10.3 mg/dL   GFR, Estimated 51 (L) >60 mL/min    Comment: (NOTE) Calculated using the CKD-EPI Creatinine Equation (2021)    Anion gap 10 5 - 15    Comment: Performed at [facility], [address] [place], [place] [address]  CBC     Status: Abnormal   Collection Time: 03/29/24  4:41 AM  Result Value Ref Range   WBC 5.9 4.0 - 10.5 K/uL   RBC 3.04 (L) 3.87 - 5.11 MIL/uL   Hemoglobin 8.3 (L) 12.0 - 15.0 g/dL   HCT 71.7  (L) 63.9 - 46.0 %   MCV 92.8 80.0 - 100.0 fL   MCH 27.3 26.0 - 34.0 pg   MCHC 29.4 (L) 30.0 - 36.0 g/dL   RDW 81.1 (H) 88.4 - 84.4 %   Platelets 358 150 - 400 K/uL   nRBC 0.0 0.0 - 0.2 %    Comment: Performed at [facility], [address], [place], [place] [address]  Basic metabolic panel with GFR     Status: Abnormal   Collection Time: 03/29/24  4:41 AM  Result Value Ref Range   Sodium 136 135 - 145 mmol/L   Potassium 4.3 3.5 - 5.1 mmol/L   Chloride 95 (L) 98 - 111 mmol/L   CO2 31 22 - 32 mmol/L   Glucose, Bld 98 70 - 99 mg/dL    Comment: Glucose reference range applies only to samples taken after fasting for at least 8 hours.   BUN 61 (H) 8 - 23 mg/dL   Creatinine, Ser 8.88 (H) 0.44 - 1.00 mg/dL   Calcium  8.3 (L) 8.9 - 10.3 mg/dL   GFR, Estimated 48 (L) >60 mL/min    Comment: (NOTE) Calculated using the CKD-EPI Creatinine  Equation (2021)    Anion gap 10 5 - 15    Comment: Performed at [facility], [address] [place], [place] [address]  CBC     Status: Abnormal   Collection Time: 03/30/24  9:37 AM  Result Value Ref Range   WBC 7.3 4.0 - 10.5 K/uL   RBC 3.28 (L) 3.87 - 5.11 MIL/uL   Hemoglobin 8.9 (L) 12.0 - 15.0 g/dL   HCT 69.3 (L) 63.9 - 53.9 %   MCV 93.3 80.0 - 100.0 fL   MCH 27.1 26.0 - 34.0 pg   MCHC 29.1 (L) 30.0 - 36.0 g/dL   RDW 81.3 (H) 88.4 - 84.4 %   Platelets 405 (H) 150 - 400 K/uL   nRBC 0.0 0.0 - 0.2 %    Comment: Performed at [facility], [address] [place], [place] [address]  Basic metabolic panel     Status: Abnormal   Collection Time: 03/30/24  9:37 AM  Result Value Ref Range   Sodium 135 135 - 145 mmol/L   Potassium 4.6 3.5 - 5.1 mmol/L   Chloride 96 (L) 98 - 111 mmol/L   CO2 31 22 - 32 mmol/L   Glucose, Bld 160 (H) 70 - 99 mg/dL    Comment: Glucose reference range applies only to samples taken after fasting for at least 8 hours.   BUN 49 (H) 8 - 23 mg/dL   Creatinine, Ser 9.13 0.44 - 1.00 mg/dL    Calcium  8.5 (L) 8.9 - 10.3 mg/dL   GFR, Estimated >39 >39 mL/min    Comment: (NOTE) Calculated using the CKD-EPI Creatinine Equation (2021)    Anion gap 8 5 - 15    Comment: Performed at [facility], [address] [place], [place] [address]  Basic metabolic panel     Status: Abnormal   Collection Time: 03/31/24 10:49 PM  Result Value Ref Range   Sodium 136 135 - 145 mmol/L   Potassium 4.2 3.5 - 5.1 mmol/L   Chloride 96 (L) 98 - 111 mmol/L   CO2 30 22 - 32 mmol/L   Glucose, Bld 121 (H) 70 - 99 mg/dL    Comment: Glucose reference range applies only to samples taken after fasting for at least 8 hours.   BUN 48 (H) 8 - 23 mg/dL   Creatinine, Ser 9.05 0.44 - 1.00 mg/dL   Calcium  8.6 (L) 8.9 - 10.3 mg/dL   GFR, Estimated 59 (L) >60 mL/min    Comment: (NOTE) Calculated using the CKD-EPI Creatinine Equation (2021)    Anion gap 10 5 - 15    Comment: Performed at [facility], [address] [place], [place] [address]  Magnesium     Status: Abnormal   Collection Time: 03/31/24 10:49 PM  Result Value Ref Range   Magnesium 2.5 (H) 1.7 - 2.4 mg/dL    Comment: Performed at [facility], [address], [place], [place] [address]       Assessment & Plan Chronic systolic (congestive) heart failure [facility]) Patient is seen by cardiology, who manage this condition.  She is well controlled with current therapy.   Will defer to them for further changes to plan of care.  Paroxysmal atrial fibrillation [facility]) Patient is seen by cardiology, who manage this condition.  She is well controlled with current therapy.   Will defer to them for further changes to plan of care.  Acute upper respiratory infection Advised pt and sister to notify us   if she is not feeling better by the end of the week.      Return in about 1 month (around 02/28/2024).   Total time spent: 20 minutes  ALAN CHRISTELLA ARRANT, FNP  01/28/2024   This document may have been prepared by [facility]  Voice Recognition software and as such may include unintentional dictation errors.

## 2024-06-10 DIAGNOSIS — L89623 Pressure ulcer of left heel, stage 3: Secondary | ICD-10-CM | POA: Diagnosis not present

## 2024-06-10 DIAGNOSIS — I5022 Chronic systolic (congestive) heart failure: Secondary | ICD-10-CM | POA: Diagnosis not present

## 2024-06-10 DIAGNOSIS — D649 Anemia, unspecified: Secondary | ICD-10-CM | POA: Diagnosis not present

## 2024-06-10 DIAGNOSIS — L97312 Non-pressure chronic ulcer of right ankle with fat layer exposed: Secondary | ICD-10-CM | POA: Diagnosis not present

## 2024-06-10 DIAGNOSIS — L97222 Non-pressure chronic ulcer of left calf with fat layer exposed: Secondary | ICD-10-CM | POA: Diagnosis not present

## 2024-06-10 DIAGNOSIS — I87311 Chronic venous hypertension (idiopathic) with ulcer of right lower extremity: Secondary | ICD-10-CM | POA: Diagnosis not present

## 2024-06-10 DIAGNOSIS — I87312 Chronic venous hypertension (idiopathic) with ulcer of left lower extremity: Secondary | ICD-10-CM | POA: Diagnosis not present

## 2024-06-10 NOTE — Assessment & Plan Note (Signed)
Patient is seen by cardiology, who manage this condition.  She is well controlled with current therapy.   Will defer to them for further changes to plan of care.

## 2024-06-11 ENCOUNTER — Encounter (INDEPENDENT_AMBULATORY_CARE_PROVIDER_SITE_OTHER)

## 2024-06-11 ENCOUNTER — Ambulatory Visit (INDEPENDENT_AMBULATORY_CARE_PROVIDER_SITE_OTHER): Admitting: Nurse Practitioner

## 2024-06-11 DIAGNOSIS — S41111D Laceration without foreign body of right upper arm, subsequent encounter: Secondary | ICD-10-CM | POA: Diagnosis not present

## 2024-06-11 DIAGNOSIS — I10 Essential (primary) hypertension: Secondary | ICD-10-CM | POA: Diagnosis not present

## 2024-06-11 DIAGNOSIS — R1011 Right upper quadrant pain: Secondary | ICD-10-CM | POA: Diagnosis not present

## 2024-06-12 DIAGNOSIS — G894 Chronic pain syndrome: Secondary | ICD-10-CM | POA: Diagnosis not present

## 2024-06-14 DIAGNOSIS — I87312 Chronic venous hypertension (idiopathic) with ulcer of left lower extremity: Secondary | ICD-10-CM | POA: Diagnosis not present

## 2024-06-14 DIAGNOSIS — I87311 Chronic venous hypertension (idiopathic) with ulcer of right lower extremity: Secondary | ICD-10-CM | POA: Diagnosis not present

## 2024-06-15 DIAGNOSIS — R1011 Right upper quadrant pain: Secondary | ICD-10-CM | POA: Diagnosis not present

## 2024-06-15 DIAGNOSIS — N270 Small kidney, unilateral: Secondary | ICD-10-CM | POA: Diagnosis not present

## 2024-06-15 DIAGNOSIS — I5032 Chronic diastolic (congestive) heart failure: Secondary | ICD-10-CM | POA: Diagnosis not present

## 2024-06-15 DIAGNOSIS — R188 Other ascites: Secondary | ICD-10-CM | POA: Diagnosis not present

## 2024-06-17 DIAGNOSIS — L97312 Non-pressure chronic ulcer of right ankle with fat layer exposed: Secondary | ICD-10-CM | POA: Diagnosis not present

## 2024-06-17 DIAGNOSIS — R1011 Right upper quadrant pain: Secondary | ICD-10-CM | POA: Diagnosis not present

## 2024-06-17 DIAGNOSIS — I87311 Chronic venous hypertension (idiopathic) with ulcer of right lower extremity: Secondary | ICD-10-CM | POA: Diagnosis not present

## 2024-06-17 DIAGNOSIS — I5032 Chronic diastolic (congestive) heart failure: Secondary | ICD-10-CM | POA: Diagnosis not present

## 2024-06-17 DIAGNOSIS — L89623 Pressure ulcer of left heel, stage 3: Secondary | ICD-10-CM | POA: Diagnosis not present

## 2024-06-21 DIAGNOSIS — L89623 Pressure ulcer of left heel, stage 3: Secondary | ICD-10-CM | POA: Diagnosis not present

## 2024-06-21 DIAGNOSIS — I87311 Chronic venous hypertension (idiopathic) with ulcer of right lower extremity: Secondary | ICD-10-CM | POA: Diagnosis not present

## 2024-06-23 DIAGNOSIS — G894 Chronic pain syndrome: Secondary | ICD-10-CM | POA: Diagnosis not present

## 2024-06-23 DIAGNOSIS — I5032 Chronic diastolic (congestive) heart failure: Secondary | ICD-10-CM | POA: Diagnosis not present

## 2024-06-23 DIAGNOSIS — I10 Essential (primary) hypertension: Secondary | ICD-10-CM | POA: Diagnosis not present

## 2024-06-24 DIAGNOSIS — I87333 Chronic venous hypertension (idiopathic) with ulcer and inflammation of bilateral lower extremity: Secondary | ICD-10-CM | POA: Diagnosis not present

## 2024-06-24 DIAGNOSIS — I5022 Chronic systolic (congestive) heart failure: Secondary | ICD-10-CM | POA: Diagnosis not present

## 2024-06-24 DIAGNOSIS — D649 Anemia, unspecified: Secondary | ICD-10-CM | POA: Diagnosis not present

## 2024-06-24 DIAGNOSIS — I739 Peripheral vascular disease, unspecified: Secondary | ICD-10-CM | POA: Diagnosis not present

## 2024-06-24 DIAGNOSIS — L89623 Pressure ulcer of left heel, stage 3: Secondary | ICD-10-CM | POA: Diagnosis not present

## 2024-06-26 DIAGNOSIS — M6259 Muscle wasting and atrophy, not elsewhere classified, multiple sites: Secondary | ICD-10-CM | POA: Diagnosis not present

## 2024-06-26 DIAGNOSIS — I5023 Acute on chronic systolic (congestive) heart failure: Secondary | ICD-10-CM | POA: Diagnosis not present

## 2024-06-26 DIAGNOSIS — R627 Adult failure to thrive: Secondary | ICD-10-CM | POA: Diagnosis not present

## 2024-06-26 DIAGNOSIS — I482 Chronic atrial fibrillation, unspecified: Secondary | ICD-10-CM | POA: Diagnosis not present

## 2024-06-26 DIAGNOSIS — L03119 Cellulitis of unspecified part of limb: Secondary | ICD-10-CM | POA: Diagnosis not present

## 2024-06-28 ENCOUNTER — Telehealth: Payer: Self-pay | Admitting: Family

## 2024-06-28 DIAGNOSIS — R1310 Dysphagia, unspecified: Secondary | ICD-10-CM | POA: Diagnosis not present

## 2024-06-28 DIAGNOSIS — G629 Polyneuropathy, unspecified: Secondary | ICD-10-CM | POA: Diagnosis not present

## 2024-06-28 DIAGNOSIS — I482 Chronic atrial fibrillation, unspecified: Secondary | ICD-10-CM | POA: Diagnosis not present

## 2024-06-28 DIAGNOSIS — M4120 Other idiopathic scoliosis, site unspecified: Secondary | ICD-10-CM | POA: Diagnosis not present

## 2024-06-28 DIAGNOSIS — E43 Unspecified severe protein-calorie malnutrition: Secondary | ICD-10-CM | POA: Diagnosis not present

## 2024-06-28 DIAGNOSIS — I2510 Atherosclerotic heart disease of native coronary artery without angina pectoris: Secondary | ICD-10-CM | POA: Diagnosis not present

## 2024-06-28 DIAGNOSIS — G8929 Other chronic pain: Secondary | ICD-10-CM | POA: Diagnosis not present

## 2024-06-28 DIAGNOSIS — Z955 Presence of coronary angioplasty implant and graft: Secondary | ICD-10-CM | POA: Diagnosis not present

## 2024-06-28 DIAGNOSIS — E785 Hyperlipidemia, unspecified: Secondary | ICD-10-CM | POA: Diagnosis not present

## 2024-06-28 DIAGNOSIS — I110 Hypertensive heart disease with heart failure: Secondary | ICD-10-CM | POA: Diagnosis not present

## 2024-06-28 DIAGNOSIS — Z7982 Long term (current) use of aspirin: Secondary | ICD-10-CM | POA: Diagnosis not present

## 2024-06-28 DIAGNOSIS — R627 Adult failure to thrive: Secondary | ICD-10-CM | POA: Diagnosis not present

## 2024-06-28 DIAGNOSIS — I5042 Chronic combined systolic (congestive) and diastolic (congestive) heart failure: Secondary | ICD-10-CM | POA: Diagnosis not present

## 2024-06-28 DIAGNOSIS — I739 Peripheral vascular disease, unspecified: Secondary | ICD-10-CM | POA: Diagnosis not present

## 2024-06-28 NOTE — Telephone Encounter (Signed)
 Burnard, PT with [facility], left VM requesting verbal orders for 1w9 for PT.  Verbal orders given to [facility].

## 2024-07-01 DIAGNOSIS — D649 Anemia, unspecified: Secondary | ICD-10-CM | POA: Diagnosis not present

## 2024-07-01 DIAGNOSIS — G8929 Other chronic pain: Secondary | ICD-10-CM | POA: Diagnosis not present

## 2024-07-01 DIAGNOSIS — I509 Heart failure, unspecified: Secondary | ICD-10-CM | POA: Diagnosis not present

## 2024-07-01 DIAGNOSIS — I482 Chronic atrial fibrillation, unspecified: Secondary | ICD-10-CM | POA: Diagnosis not present

## 2024-07-05 DIAGNOSIS — E43 Unspecified severe protein-calorie malnutrition: Secondary | ICD-10-CM | POA: Diagnosis not present

## 2024-07-05 DIAGNOSIS — R1310 Dysphagia, unspecified: Secondary | ICD-10-CM | POA: Diagnosis not present

## 2024-07-05 DIAGNOSIS — E785 Hyperlipidemia, unspecified: Secondary | ICD-10-CM | POA: Diagnosis not present

## 2024-07-05 DIAGNOSIS — G8929 Other chronic pain: Secondary | ICD-10-CM | POA: Diagnosis not present

## 2024-07-05 DIAGNOSIS — M4120 Other idiopathic scoliosis, site unspecified: Secondary | ICD-10-CM | POA: Diagnosis not present

## 2024-07-05 DIAGNOSIS — I739 Peripheral vascular disease, unspecified: Secondary | ICD-10-CM | POA: Diagnosis not present

## 2024-07-05 DIAGNOSIS — I110 Hypertensive heart disease with heart failure: Secondary | ICD-10-CM | POA: Diagnosis not present

## 2024-07-05 DIAGNOSIS — Z955 Presence of coronary angioplasty implant and graft: Secondary | ICD-10-CM | POA: Diagnosis not present

## 2024-07-05 DIAGNOSIS — I5042 Chronic combined systolic (congestive) and diastolic (congestive) heart failure: Secondary | ICD-10-CM | POA: Diagnosis not present

## 2024-07-05 DIAGNOSIS — I482 Chronic atrial fibrillation, unspecified: Secondary | ICD-10-CM | POA: Diagnosis not present

## 2024-07-05 DIAGNOSIS — G629 Polyneuropathy, unspecified: Secondary | ICD-10-CM | POA: Diagnosis not present

## 2024-07-05 DIAGNOSIS — R627 Adult failure to thrive: Secondary | ICD-10-CM | POA: Diagnosis not present

## 2024-07-05 DIAGNOSIS — I2510 Atherosclerotic heart disease of native coronary artery without angina pectoris: Secondary | ICD-10-CM | POA: Diagnosis not present

## 2024-07-05 DIAGNOSIS — Z7982 Long term (current) use of aspirin: Secondary | ICD-10-CM | POA: Diagnosis not present

## 2024-07-06 ENCOUNTER — Telehealth: Payer: Self-pay | Admitting: Family

## 2024-07-06 DIAGNOSIS — I482 Chronic atrial fibrillation, unspecified: Secondary | ICD-10-CM | POA: Diagnosis not present

## 2024-07-06 DIAGNOSIS — I509 Heart failure, unspecified: Secondary | ICD-10-CM | POA: Diagnosis not present

## 2024-07-06 DIAGNOSIS — I10 Essential (primary) hypertension: Secondary | ICD-10-CM | POA: Diagnosis not present

## 2024-07-06 DIAGNOSIS — D649 Anemia, unspecified: Secondary | ICD-10-CM | POA: Diagnosis not present

## 2024-07-06 NOTE — Telephone Encounter (Signed)
 Jori, OT with [facility], left VM requesting verbal order for OT 1w8.  Per Alan this will be fine.

## 2024-07-07 ENCOUNTER — Other Ambulatory Visit: Payer: Self-pay | Admitting: Family

## 2024-07-07 DIAGNOSIS — D519 Vitamin B12 deficiency anemia, unspecified: Secondary | ICD-10-CM | POA: Diagnosis not present

## 2024-07-07 DIAGNOSIS — E559 Vitamin D deficiency, unspecified: Secondary | ICD-10-CM | POA: Diagnosis not present

## 2024-07-07 DIAGNOSIS — E782 Mixed hyperlipidemia: Secondary | ICD-10-CM | POA: Diagnosis not present

## 2024-07-07 DIAGNOSIS — E038 Other specified hypothyroidism: Secondary | ICD-10-CM | POA: Diagnosis not present

## 2024-07-13 DIAGNOSIS — M4120 Other idiopathic scoliosis, site unspecified: Secondary | ICD-10-CM | POA: Diagnosis not present

## 2024-07-13 DIAGNOSIS — I482 Chronic atrial fibrillation, unspecified: Secondary | ICD-10-CM | POA: Diagnosis not present

## 2024-07-13 DIAGNOSIS — I509 Heart failure, unspecified: Secondary | ICD-10-CM | POA: Diagnosis not present

## 2024-07-13 DIAGNOSIS — Z7982 Long term (current) use of aspirin: Secondary | ICD-10-CM | POA: Diagnosis not present

## 2024-07-13 DIAGNOSIS — I2510 Atherosclerotic heart disease of native coronary artery without angina pectoris: Secondary | ICD-10-CM | POA: Diagnosis not present

## 2024-07-13 DIAGNOSIS — Z955 Presence of coronary angioplasty implant and graft: Secondary | ICD-10-CM | POA: Diagnosis not present

## 2024-07-13 DIAGNOSIS — I739 Peripheral vascular disease, unspecified: Secondary | ICD-10-CM | POA: Diagnosis not present

## 2024-07-13 DIAGNOSIS — D649 Anemia, unspecified: Secondary | ICD-10-CM | POA: Diagnosis not present

## 2024-07-13 DIAGNOSIS — G8929 Other chronic pain: Secondary | ICD-10-CM | POA: Diagnosis not present

## 2024-07-13 DIAGNOSIS — G629 Polyneuropathy, unspecified: Secondary | ICD-10-CM | POA: Diagnosis not present

## 2024-07-13 DIAGNOSIS — R627 Adult failure to thrive: Secondary | ICD-10-CM | POA: Diagnosis not present

## 2024-07-13 DIAGNOSIS — R1310 Dysphagia, unspecified: Secondary | ICD-10-CM | POA: Diagnosis not present

## 2024-07-13 DIAGNOSIS — E785 Hyperlipidemia, unspecified: Secondary | ICD-10-CM | POA: Diagnosis not present

## 2024-07-13 DIAGNOSIS — I5042 Chronic combined systolic (congestive) and diastolic (congestive) heart failure: Secondary | ICD-10-CM | POA: Diagnosis not present

## 2024-07-13 DIAGNOSIS — I10 Essential (primary) hypertension: Secondary | ICD-10-CM | POA: Diagnosis not present

## 2024-07-13 DIAGNOSIS — E43 Unspecified severe protein-calorie malnutrition: Secondary | ICD-10-CM | POA: Diagnosis not present

## 2024-07-13 DIAGNOSIS — I110 Hypertensive heart disease with heart failure: Secondary | ICD-10-CM | POA: Diagnosis not present

## 2024-07-14 DIAGNOSIS — E559 Vitamin D deficiency, unspecified: Secondary | ICD-10-CM | POA: Diagnosis not present

## 2024-07-14 DIAGNOSIS — Z79899 Other long term (current) drug therapy: Secondary | ICD-10-CM | POA: Diagnosis not present

## 2024-07-14 DIAGNOSIS — R7309 Other abnormal glucose: Secondary | ICD-10-CM | POA: Diagnosis not present

## 2024-07-14 DIAGNOSIS — E782 Mixed hyperlipidemia: Secondary | ICD-10-CM | POA: Diagnosis not present

## 2024-07-14 DIAGNOSIS — D519 Vitamin B12 deficiency anemia, unspecified: Secondary | ICD-10-CM | POA: Diagnosis not present

## 2024-07-14 DIAGNOSIS — E038 Other specified hypothyroidism: Secondary | ICD-10-CM | POA: Diagnosis not present

## 2024-07-16 DIAGNOSIS — G629 Polyneuropathy, unspecified: Secondary | ICD-10-CM | POA: Diagnosis not present

## 2024-07-16 DIAGNOSIS — I5042 Chronic combined systolic (congestive) and diastolic (congestive) heart failure: Secondary | ICD-10-CM | POA: Diagnosis not present

## 2024-07-16 DIAGNOSIS — I110 Hypertensive heart disease with heart failure: Secondary | ICD-10-CM | POA: Diagnosis not present

## 2024-07-16 DIAGNOSIS — I482 Chronic atrial fibrillation, unspecified: Secondary | ICD-10-CM | POA: Diagnosis not present

## 2024-07-16 DIAGNOSIS — E785 Hyperlipidemia, unspecified: Secondary | ICD-10-CM | POA: Diagnosis not present

## 2024-07-16 DIAGNOSIS — Z955 Presence of coronary angioplasty implant and graft: Secondary | ICD-10-CM | POA: Diagnosis not present

## 2024-07-16 DIAGNOSIS — Z7982 Long term (current) use of aspirin: Secondary | ICD-10-CM | POA: Diagnosis not present

## 2024-07-16 DIAGNOSIS — R627 Adult failure to thrive: Secondary | ICD-10-CM | POA: Diagnosis not present

## 2024-07-16 DIAGNOSIS — R1310 Dysphagia, unspecified: Secondary | ICD-10-CM | POA: Diagnosis not present

## 2024-07-16 DIAGNOSIS — M4120 Other idiopathic scoliosis, site unspecified: Secondary | ICD-10-CM | POA: Diagnosis not present

## 2024-07-16 DIAGNOSIS — G8929 Other chronic pain: Secondary | ICD-10-CM | POA: Diagnosis not present

## 2024-07-16 DIAGNOSIS — I739 Peripheral vascular disease, unspecified: Secondary | ICD-10-CM | POA: Diagnosis not present

## 2024-07-16 DIAGNOSIS — I2510 Atherosclerotic heart disease of native coronary artery without angina pectoris: Secondary | ICD-10-CM | POA: Diagnosis not present

## 2024-07-16 DIAGNOSIS — E43 Unspecified severe protein-calorie malnutrition: Secondary | ICD-10-CM | POA: Diagnosis not present

## 2024-07-20 ENCOUNTER — Ambulatory Visit: Admitting: Family

## 2024-07-20 DIAGNOSIS — I10 Essential (primary) hypertension: Secondary | ICD-10-CM | POA: Diagnosis not present

## 2024-07-20 DIAGNOSIS — I509 Heart failure, unspecified: Secondary | ICD-10-CM | POA: Diagnosis not present

## 2024-07-20 DIAGNOSIS — I2510 Atherosclerotic heart disease of native coronary artery without angina pectoris: Secondary | ICD-10-CM | POA: Diagnosis not present

## 2024-07-20 DIAGNOSIS — I482 Chronic atrial fibrillation, unspecified: Secondary | ICD-10-CM | POA: Diagnosis not present

## 2024-07-22 DIAGNOSIS — R627 Adult failure to thrive: Secondary | ICD-10-CM | POA: Diagnosis not present

## 2024-07-22 DIAGNOSIS — Z955 Presence of coronary angioplasty implant and graft: Secondary | ICD-10-CM | POA: Diagnosis not present

## 2024-07-22 DIAGNOSIS — E785 Hyperlipidemia, unspecified: Secondary | ICD-10-CM | POA: Diagnosis not present

## 2024-07-22 DIAGNOSIS — Z7982 Long term (current) use of aspirin: Secondary | ICD-10-CM | POA: Diagnosis not present

## 2024-07-22 DIAGNOSIS — I739 Peripheral vascular disease, unspecified: Secondary | ICD-10-CM | POA: Diagnosis not present

## 2024-07-22 DIAGNOSIS — G8929 Other chronic pain: Secondary | ICD-10-CM | POA: Diagnosis not present

## 2024-07-22 DIAGNOSIS — R1310 Dysphagia, unspecified: Secondary | ICD-10-CM | POA: Diagnosis not present

## 2024-07-22 DIAGNOSIS — I5042 Chronic combined systolic (congestive) and diastolic (congestive) heart failure: Secondary | ICD-10-CM | POA: Diagnosis not present

## 2024-07-22 DIAGNOSIS — G629 Polyneuropathy, unspecified: Secondary | ICD-10-CM | POA: Diagnosis not present

## 2024-07-22 DIAGNOSIS — I482 Chronic atrial fibrillation, unspecified: Secondary | ICD-10-CM | POA: Diagnosis not present

## 2024-07-22 DIAGNOSIS — I2510 Atherosclerotic heart disease of native coronary artery without angina pectoris: Secondary | ICD-10-CM | POA: Diagnosis not present

## 2024-07-22 DIAGNOSIS — I110 Hypertensive heart disease with heart failure: Secondary | ICD-10-CM | POA: Diagnosis not present

## 2024-07-22 DIAGNOSIS — E43 Unspecified severe protein-calorie malnutrition: Secondary | ICD-10-CM | POA: Diagnosis not present

## 2024-07-22 DIAGNOSIS — M4120 Other idiopathic scoliosis, site unspecified: Secondary | ICD-10-CM | POA: Diagnosis not present

## 2024-07-23 ENCOUNTER — Other Ambulatory Visit: Payer: Self-pay | Admitting: Family

## 2024-07-23 DIAGNOSIS — I25118 Atherosclerotic heart disease of native coronary artery with other forms of angina pectoris: Secondary | ICD-10-CM

## 2024-07-23 DIAGNOSIS — I482 Chronic atrial fibrillation, unspecified: Secondary | ICD-10-CM

## 2024-07-26 DIAGNOSIS — R1310 Dysphagia, unspecified: Secondary | ICD-10-CM | POA: Diagnosis not present

## 2024-07-26 DIAGNOSIS — E785 Hyperlipidemia, unspecified: Secondary | ICD-10-CM | POA: Diagnosis not present

## 2024-07-26 DIAGNOSIS — R627 Adult failure to thrive: Secondary | ICD-10-CM | POA: Diagnosis not present

## 2024-07-26 DIAGNOSIS — E038 Other specified hypothyroidism: Secondary | ICD-10-CM | POA: Diagnosis not present

## 2024-07-26 DIAGNOSIS — I739 Peripheral vascular disease, unspecified: Secondary | ICD-10-CM | POA: Diagnosis not present

## 2024-07-26 DIAGNOSIS — Z7982 Long term (current) use of aspirin: Secondary | ICD-10-CM | POA: Diagnosis not present

## 2024-07-26 DIAGNOSIS — G629 Polyneuropathy, unspecified: Secondary | ICD-10-CM | POA: Diagnosis not present

## 2024-07-26 DIAGNOSIS — I2510 Atherosclerotic heart disease of native coronary artery without angina pectoris: Secondary | ICD-10-CM | POA: Diagnosis not present

## 2024-07-26 DIAGNOSIS — G8929 Other chronic pain: Secondary | ICD-10-CM | POA: Diagnosis not present

## 2024-07-26 DIAGNOSIS — E559 Vitamin D deficiency, unspecified: Secondary | ICD-10-CM | POA: Diagnosis not present

## 2024-07-26 DIAGNOSIS — M4120 Other idiopathic scoliosis, site unspecified: Secondary | ICD-10-CM | POA: Diagnosis not present

## 2024-07-26 DIAGNOSIS — E782 Mixed hyperlipidemia: Secondary | ICD-10-CM | POA: Diagnosis not present

## 2024-07-26 DIAGNOSIS — I5042 Chronic combined systolic (congestive) and diastolic (congestive) heart failure: Secondary | ICD-10-CM | POA: Diagnosis not present

## 2024-07-26 DIAGNOSIS — E43 Unspecified severe protein-calorie malnutrition: Secondary | ICD-10-CM | POA: Diagnosis not present

## 2024-07-26 DIAGNOSIS — D519 Vitamin B12 deficiency anemia, unspecified: Secondary | ICD-10-CM | POA: Diagnosis not present

## 2024-07-26 DIAGNOSIS — Z955 Presence of coronary angioplasty implant and graft: Secondary | ICD-10-CM | POA: Diagnosis not present

## 2024-07-26 DIAGNOSIS — I482 Chronic atrial fibrillation, unspecified: Secondary | ICD-10-CM | POA: Diagnosis not present

## 2024-07-28 ENCOUNTER — Ambulatory Visit: Admitting: Family

## 2024-07-29 DIAGNOSIS — E43 Unspecified severe protein-calorie malnutrition: Secondary | ICD-10-CM | POA: Diagnosis not present

## 2024-07-29 DIAGNOSIS — M4120 Other idiopathic scoliosis, site unspecified: Secondary | ICD-10-CM | POA: Diagnosis not present

## 2024-07-29 DIAGNOSIS — I2510 Atherosclerotic heart disease of native coronary artery without angina pectoris: Secondary | ICD-10-CM | POA: Diagnosis not present

## 2024-07-29 DIAGNOSIS — G8929 Other chronic pain: Secondary | ICD-10-CM | POA: Diagnosis not present

## 2024-07-29 DIAGNOSIS — G629 Polyneuropathy, unspecified: Secondary | ICD-10-CM | POA: Diagnosis not present

## 2024-07-29 DIAGNOSIS — I110 Hypertensive heart disease with heart failure: Secondary | ICD-10-CM | POA: Diagnosis not present

## 2024-07-29 DIAGNOSIS — Z7982 Long term (current) use of aspirin: Secondary | ICD-10-CM | POA: Diagnosis not present

## 2024-07-29 DIAGNOSIS — R627 Adult failure to thrive: Secondary | ICD-10-CM | POA: Diagnosis not present

## 2024-07-29 DIAGNOSIS — I5042 Chronic combined systolic (congestive) and diastolic (congestive) heart failure: Secondary | ICD-10-CM | POA: Diagnosis not present

## 2024-07-29 DIAGNOSIS — I739 Peripheral vascular disease, unspecified: Secondary | ICD-10-CM | POA: Diagnosis not present

## 2024-07-29 DIAGNOSIS — Z955 Presence of coronary angioplasty implant and graft: Secondary | ICD-10-CM | POA: Diagnosis not present

## 2024-07-29 DIAGNOSIS — E785 Hyperlipidemia, unspecified: Secondary | ICD-10-CM | POA: Diagnosis not present

## 2024-07-29 DIAGNOSIS — R1310 Dysphagia, unspecified: Secondary | ICD-10-CM | POA: Diagnosis not present

## 2024-07-29 DIAGNOSIS — I482 Chronic atrial fibrillation, unspecified: Secondary | ICD-10-CM | POA: Diagnosis not present

## 2024-07-30 DIAGNOSIS — R011 Cardiac murmur, unspecified: Secondary | ICD-10-CM | POA: Diagnosis not present

## 2024-07-30 DIAGNOSIS — R012 Other cardiac sounds: Secondary | ICD-10-CM | POA: Diagnosis not present

## 2024-08-02 DIAGNOSIS — Z79899 Other long term (current) drug therapy: Secondary | ICD-10-CM | POA: Diagnosis not present

## 2024-08-05 DIAGNOSIS — G8929 Other chronic pain: Secondary | ICD-10-CM | POA: Diagnosis not present

## 2024-08-05 DIAGNOSIS — R627 Adult failure to thrive: Secondary | ICD-10-CM | POA: Diagnosis not present

## 2024-08-05 DIAGNOSIS — E785 Hyperlipidemia, unspecified: Secondary | ICD-10-CM | POA: Diagnosis not present

## 2024-08-05 DIAGNOSIS — I2510 Atherosclerotic heart disease of native coronary artery without angina pectoris: Secondary | ICD-10-CM | POA: Diagnosis not present

## 2024-08-05 DIAGNOSIS — I5042 Chronic combined systolic (congestive) and diastolic (congestive) heart failure: Secondary | ICD-10-CM | POA: Diagnosis not present

## 2024-08-05 DIAGNOSIS — E43 Unspecified severe protein-calorie malnutrition: Secondary | ICD-10-CM | POA: Diagnosis not present

## 2024-08-05 DIAGNOSIS — I739 Peripheral vascular disease, unspecified: Secondary | ICD-10-CM | POA: Diagnosis not present

## 2024-08-05 DIAGNOSIS — I482 Chronic atrial fibrillation, unspecified: Secondary | ICD-10-CM | POA: Diagnosis not present

## 2024-08-05 DIAGNOSIS — Z955 Presence of coronary angioplasty implant and graft: Secondary | ICD-10-CM | POA: Diagnosis not present

## 2024-08-05 DIAGNOSIS — I110 Hypertensive heart disease with heart failure: Secondary | ICD-10-CM | POA: Diagnosis not present

## 2024-08-05 DIAGNOSIS — R1310 Dysphagia, unspecified: Secondary | ICD-10-CM | POA: Diagnosis not present

## 2024-08-05 DIAGNOSIS — M4120 Other idiopathic scoliosis, site unspecified: Secondary | ICD-10-CM | POA: Diagnosis not present

## 2024-08-05 DIAGNOSIS — Z7982 Long term (current) use of aspirin: Secondary | ICD-10-CM | POA: Diagnosis not present

## 2024-08-05 DIAGNOSIS — G629 Polyneuropathy, unspecified: Secondary | ICD-10-CM | POA: Diagnosis not present

## 2024-08-10 DIAGNOSIS — L97309 Non-pressure chronic ulcer of unspecified ankle with unspecified severity: Secondary | ICD-10-CM | POA: Diagnosis not present

## 2024-08-10 DIAGNOSIS — Z7982 Long term (current) use of aspirin: Secondary | ICD-10-CM | POA: Diagnosis not present

## 2024-08-10 DIAGNOSIS — I509 Heart failure, unspecified: Secondary | ICD-10-CM | POA: Diagnosis not present

## 2024-08-10 DIAGNOSIS — R627 Adult failure to thrive: Secondary | ICD-10-CM | POA: Diagnosis not present

## 2024-08-10 DIAGNOSIS — G8929 Other chronic pain: Secondary | ICD-10-CM | POA: Diagnosis not present

## 2024-08-10 DIAGNOSIS — I482 Chronic atrial fibrillation, unspecified: Secondary | ICD-10-CM | POA: Diagnosis not present

## 2024-08-10 DIAGNOSIS — Z955 Presence of coronary angioplasty implant and graft: Secondary | ICD-10-CM | POA: Diagnosis not present

## 2024-08-10 DIAGNOSIS — E785 Hyperlipidemia, unspecified: Secondary | ICD-10-CM | POA: Diagnosis not present

## 2024-08-10 DIAGNOSIS — I739 Peripheral vascular disease, unspecified: Secondary | ICD-10-CM | POA: Diagnosis not present

## 2024-08-10 DIAGNOSIS — I5042 Chronic combined systolic (congestive) and diastolic (congestive) heart failure: Secondary | ICD-10-CM | POA: Diagnosis not present

## 2024-08-10 DIAGNOSIS — R1310 Dysphagia, unspecified: Secondary | ICD-10-CM | POA: Diagnosis not present

## 2024-08-10 DIAGNOSIS — M4120 Other idiopathic scoliosis, site unspecified: Secondary | ICD-10-CM | POA: Diagnosis not present

## 2024-08-10 DIAGNOSIS — E43 Unspecified severe protein-calorie malnutrition: Secondary | ICD-10-CM | POA: Diagnosis not present

## 2024-08-10 DIAGNOSIS — G629 Polyneuropathy, unspecified: Secondary | ICD-10-CM | POA: Diagnosis not present

## 2024-08-10 DIAGNOSIS — I10 Essential (primary) hypertension: Secondary | ICD-10-CM | POA: Diagnosis not present

## 2024-08-10 DIAGNOSIS — I2510 Atherosclerotic heart disease of native coronary artery without angina pectoris: Secondary | ICD-10-CM | POA: Diagnosis not present

## 2024-08-10 DIAGNOSIS — I110 Hypertensive heart disease with heart failure: Secondary | ICD-10-CM | POA: Diagnosis not present

## 2024-08-13 ENCOUNTER — Other Ambulatory Visit: Payer: Self-pay

## 2024-08-13 DIAGNOSIS — I517 Cardiomegaly: Secondary | ICD-10-CM | POA: Diagnosis not present

## 2024-08-13 DIAGNOSIS — I10 Essential (primary) hypertension: Secondary | ICD-10-CM | POA: Diagnosis not present

## 2024-08-13 DIAGNOSIS — I272 Pulmonary hypertension, unspecified: Secondary | ICD-10-CM | POA: Diagnosis not present

## 2024-08-13 DIAGNOSIS — I3139 Other pericardial effusion (noninflammatory): Secondary | ICD-10-CM | POA: Diagnosis not present

## 2024-08-16 ENCOUNTER — Other Ambulatory Visit: Payer: Self-pay | Admitting: Family

## 2024-08-16 DIAGNOSIS — I2510 Atherosclerotic heart disease of native coronary artery without angina pectoris: Secondary | ICD-10-CM | POA: Diagnosis not present

## 2024-08-16 DIAGNOSIS — E43 Unspecified severe protein-calorie malnutrition: Secondary | ICD-10-CM | POA: Diagnosis not present

## 2024-08-16 DIAGNOSIS — M4120 Other idiopathic scoliosis, site unspecified: Secondary | ICD-10-CM | POA: Diagnosis not present

## 2024-08-16 DIAGNOSIS — G8929 Other chronic pain: Secondary | ICD-10-CM | POA: Diagnosis not present

## 2024-08-16 DIAGNOSIS — Z955 Presence of coronary angioplasty implant and graft: Secondary | ICD-10-CM | POA: Diagnosis not present

## 2024-08-16 DIAGNOSIS — I739 Peripheral vascular disease, unspecified: Secondary | ICD-10-CM | POA: Diagnosis not present

## 2024-08-16 DIAGNOSIS — R1310 Dysphagia, unspecified: Secondary | ICD-10-CM | POA: Diagnosis not present

## 2024-08-16 DIAGNOSIS — I482 Chronic atrial fibrillation, unspecified: Secondary | ICD-10-CM | POA: Diagnosis not present

## 2024-08-16 DIAGNOSIS — G629 Polyneuropathy, unspecified: Secondary | ICD-10-CM | POA: Diagnosis not present

## 2024-08-16 DIAGNOSIS — Z7982 Long term (current) use of aspirin: Secondary | ICD-10-CM | POA: Diagnosis not present

## 2024-08-16 DIAGNOSIS — I5042 Chronic combined systolic (congestive) and diastolic (congestive) heart failure: Secondary | ICD-10-CM | POA: Diagnosis not present

## 2024-08-16 DIAGNOSIS — R627 Adult failure to thrive: Secondary | ICD-10-CM | POA: Diagnosis not present

## 2024-08-16 DIAGNOSIS — E785 Hyperlipidemia, unspecified: Secondary | ICD-10-CM | POA: Diagnosis not present

## 2024-08-16 DIAGNOSIS — I110 Hypertensive heart disease with heart failure: Secondary | ICD-10-CM | POA: Diagnosis not present

## 2024-08-17 ENCOUNTER — Other Ambulatory Visit: Payer: Self-pay | Admitting: Family

## 2024-08-17 DIAGNOSIS — R627 Adult failure to thrive: Secondary | ICD-10-CM | POA: Diagnosis not present

## 2024-08-17 DIAGNOSIS — I5032 Chronic diastolic (congestive) heart failure: Secondary | ICD-10-CM | POA: Diagnosis not present

## 2024-08-17 DIAGNOSIS — G990 Autonomic neuropathy in diseases classified elsewhere: Secondary | ICD-10-CM

## 2024-08-17 DIAGNOSIS — R634 Abnormal weight loss: Secondary | ICD-10-CM | POA: Diagnosis not present

## 2024-08-17 DIAGNOSIS — G894 Chronic pain syndrome: Secondary | ICD-10-CM | POA: Diagnosis not present

## 2024-08-17 DIAGNOSIS — E46 Unspecified protein-calorie malnutrition: Secondary | ICD-10-CM | POA: Diagnosis not present

## 2024-08-20 DIAGNOSIS — I482 Chronic atrial fibrillation, unspecified: Secondary | ICD-10-CM | POA: Diagnosis not present

## 2024-08-20 DIAGNOSIS — I739 Peripheral vascular disease, unspecified: Secondary | ICD-10-CM | POA: Diagnosis not present

## 2024-08-20 DIAGNOSIS — Z955 Presence of coronary angioplasty implant and graft: Secondary | ICD-10-CM | POA: Diagnosis not present

## 2024-08-20 DIAGNOSIS — G8929 Other chronic pain: Secondary | ICD-10-CM | POA: Diagnosis not present

## 2024-08-20 DIAGNOSIS — Z7982 Long term (current) use of aspirin: Secondary | ICD-10-CM | POA: Diagnosis not present

## 2024-08-20 DIAGNOSIS — R627 Adult failure to thrive: Secondary | ICD-10-CM | POA: Diagnosis not present

## 2024-08-20 DIAGNOSIS — M4120 Other idiopathic scoliosis, site unspecified: Secondary | ICD-10-CM | POA: Diagnosis not present

## 2024-08-20 DIAGNOSIS — R1310 Dysphagia, unspecified: Secondary | ICD-10-CM | POA: Diagnosis not present

## 2024-08-20 DIAGNOSIS — I2510 Atherosclerotic heart disease of native coronary artery without angina pectoris: Secondary | ICD-10-CM | POA: Diagnosis not present

## 2024-08-20 DIAGNOSIS — I5042 Chronic combined systolic (congestive) and diastolic (congestive) heart failure: Secondary | ICD-10-CM | POA: Diagnosis not present

## 2024-08-20 DIAGNOSIS — E43 Unspecified severe protein-calorie malnutrition: Secondary | ICD-10-CM | POA: Diagnosis not present

## 2024-08-20 DIAGNOSIS — E785 Hyperlipidemia, unspecified: Secondary | ICD-10-CM | POA: Diagnosis not present

## 2024-08-20 DIAGNOSIS — G629 Polyneuropathy, unspecified: Secondary | ICD-10-CM | POA: Diagnosis not present

## 2024-08-20 DIAGNOSIS — I110 Hypertensive heart disease with heart failure: Secondary | ICD-10-CM | POA: Diagnosis not present

## 2024-08-23 DIAGNOSIS — Z955 Presence of coronary angioplasty implant and graft: Secondary | ICD-10-CM | POA: Diagnosis not present

## 2024-08-23 DIAGNOSIS — I110 Hypertensive heart disease with heart failure: Secondary | ICD-10-CM | POA: Diagnosis not present

## 2024-08-23 DIAGNOSIS — I5042 Chronic combined systolic (congestive) and diastolic (congestive) heart failure: Secondary | ICD-10-CM | POA: Diagnosis not present

## 2024-08-23 DIAGNOSIS — G629 Polyneuropathy, unspecified: Secondary | ICD-10-CM | POA: Diagnosis not present

## 2024-08-23 DIAGNOSIS — I482 Chronic atrial fibrillation, unspecified: Secondary | ICD-10-CM | POA: Diagnosis not present

## 2024-08-23 DIAGNOSIS — E43 Unspecified severe protein-calorie malnutrition: Secondary | ICD-10-CM | POA: Diagnosis not present

## 2024-08-23 DIAGNOSIS — M4120 Other idiopathic scoliosis, site unspecified: Secondary | ICD-10-CM | POA: Diagnosis not present

## 2024-08-23 DIAGNOSIS — R627 Adult failure to thrive: Secondary | ICD-10-CM | POA: Diagnosis not present

## 2024-08-23 DIAGNOSIS — R1310 Dysphagia, unspecified: Secondary | ICD-10-CM | POA: Diagnosis not present

## 2024-08-23 DIAGNOSIS — I739 Peripheral vascular disease, unspecified: Secondary | ICD-10-CM | POA: Diagnosis not present

## 2024-08-23 DIAGNOSIS — G8929 Other chronic pain: Secondary | ICD-10-CM | POA: Diagnosis not present

## 2024-08-23 DIAGNOSIS — I2510 Atherosclerotic heart disease of native coronary artery without angina pectoris: Secondary | ICD-10-CM | POA: Diagnosis not present

## 2024-08-23 DIAGNOSIS — Z7982 Long term (current) use of aspirin: Secondary | ICD-10-CM | POA: Diagnosis not present

## 2024-08-23 DIAGNOSIS — E785 Hyperlipidemia, unspecified: Secondary | ICD-10-CM | POA: Diagnosis not present

## 2024-08-25 DIAGNOSIS — R1310 Dysphagia, unspecified: Secondary | ICD-10-CM | POA: Diagnosis not present

## 2024-08-25 DIAGNOSIS — I5042 Chronic combined systolic (congestive) and diastolic (congestive) heart failure: Secondary | ICD-10-CM | POA: Diagnosis not present

## 2024-08-25 DIAGNOSIS — G8929 Other chronic pain: Secondary | ICD-10-CM | POA: Diagnosis not present

## 2024-08-25 DIAGNOSIS — Z7982 Long term (current) use of aspirin: Secondary | ICD-10-CM | POA: Diagnosis not present

## 2024-08-25 DIAGNOSIS — I482 Chronic atrial fibrillation, unspecified: Secondary | ICD-10-CM | POA: Diagnosis not present

## 2024-08-25 DIAGNOSIS — I739 Peripheral vascular disease, unspecified: Secondary | ICD-10-CM | POA: Diagnosis not present

## 2024-08-25 DIAGNOSIS — I2510 Atherosclerotic heart disease of native coronary artery without angina pectoris: Secondary | ICD-10-CM | POA: Diagnosis not present

## 2024-08-25 DIAGNOSIS — E785 Hyperlipidemia, unspecified: Secondary | ICD-10-CM | POA: Diagnosis not present

## 2024-08-25 DIAGNOSIS — Z955 Presence of coronary angioplasty implant and graft: Secondary | ICD-10-CM | POA: Diagnosis not present

## 2024-08-25 DIAGNOSIS — R627 Adult failure to thrive: Secondary | ICD-10-CM | POA: Diagnosis not present

## 2024-08-25 DIAGNOSIS — E43 Unspecified severe protein-calorie malnutrition: Secondary | ICD-10-CM | POA: Diagnosis not present

## 2024-08-25 DIAGNOSIS — M4120 Other idiopathic scoliosis, site unspecified: Secondary | ICD-10-CM | POA: Diagnosis not present

## 2024-08-25 DIAGNOSIS — G629 Polyneuropathy, unspecified: Secondary | ICD-10-CM | POA: Diagnosis not present

## 2024-08-25 DIAGNOSIS — I110 Hypertensive heart disease with heart failure: Secondary | ICD-10-CM | POA: Diagnosis not present

## 2024-08-31 ENCOUNTER — Other Ambulatory Visit: Payer: Self-pay | Admitting: Family

## 2024-08-31 DIAGNOSIS — G990 Autonomic neuropathy in diseases classified elsewhere: Secondary | ICD-10-CM

## 2024-09-02 ENCOUNTER — Other Ambulatory Visit: Payer: Self-pay

## 2024-09-02 DIAGNOSIS — G990 Autonomic neuropathy in diseases classified elsewhere: Secondary | ICD-10-CM

## 2024-09-04 ENCOUNTER — Encounter: Payer: Self-pay | Admitting: Family

## 2024-09-04 NOTE — Assessment & Plan Note (Signed)
Patient is seen by Vascular, who manage this condition.  She is well controlled with current therapy.   Will defer to them for further changes to plan of care.

## 2024-09-04 NOTE — Assessment & Plan Note (Signed)
 Encouraged increased calorie intake to manage her nutritional needs, if necessary to utilize supplements as needed.

## 2024-09-04 NOTE — Progress Notes (Signed)
 Established Patient Office Visit  Subjective:  Patient ID: [name] [name], female    DOB: 01-Mar-1935  Age: 89 y.o. MRN: [id number]  Chief Complaint  Patient presents with   Hospitalization Follow-up    Pt here today for hospital follow up.  She is feeling about the same as at her last appointment, says that she is feeling exhausted and weak.  Blood pressure is still very elevated today  She is taking her medications according to her report as well as her sister.     No other concerns at this time.   Past Medical History:  Diagnosis Date   AKI (acute kidney injury) 02/20/2023   Atrial fibrillation (HCC)    Basal cell carcinoma 2021   L temple   Chronic back pain    Congestive heart failure (CHF) (HCC)    History of total hip replacement (Bilateral) 01/12/2020   Left hip replacement done in 2019.  Right hip replacement done in 2012.   Hypertension    Hyponatremia 06/11/2020   Melena 02/21/2023   NSTEMI (non-ST elevated myocardial infarction) (HCC) 06/11/2020   Pharmacologic therapy 01/12/2020   Problems influencing health status 01/12/2020   Scoliosis    Upper GI bleeding 02/21/2023    Past Surgical History:  Procedure Laterality Date   ABDOMINAL SURGERY     bilateral hip replacement     CORONARY STENT INTERVENTION N/A 06/12/2020   Procedure: CORONARY STENT INTERVENTION;  Surgeon: Darron Deatrice LABOR, MD;  Location: [facility] INVASIVE CV LAB;  Service: Cardiovascular;  Laterality: N/A;  LAD   ESOPHAGOGASTRODUODENOSCOPY (EGD) WITH PROPOFOL  N/A 02/24/2023   Procedure: ESOPHAGOGASTRODUODENOSCOPY (EGD) WITH PROPOFOL ;  Surgeon: Jinny Carmine, MD;  Location: [facility] ENDOSCOPY;  Service: Endoscopy;  Laterality: N/A;   LEFT HEART CATH AND CORONARY ANGIOGRAPHY N/A 06/12/2020   Procedure: LEFT HEART CATH AND CORONARY ANGIOGRAPHY;  Surgeon: Darron Deatrice LABOR, MD;  Location: [facility] INVASIVE CV LAB;  Service: Cardiovascular;  Laterality: N/A;   LOWER EXTREMITY ANGIOGRAPHY Right 02/25/2023    Procedure: Lower Extremity Angiography;  Surgeon: Jama Cordella MATSU, MD;  Location: [facility] INVASIVE CV LAB;  Service: Cardiovascular;  Laterality: Right;   TONSILLECTOMY      Social History   Socioeconomic History   Marital status: Single    Spouse name: Not on file   Number of children: 0   Years of education: Not on file   Highest education level: Not on file  Occupational History   Not on file  Tobacco Use   Smoking status: Former    Current packs/day: 0.00    Average packs/day: 0.5 packs/day for 68.0 years (34.0 ttl pk-yrs)    Types: Cigarettes    Start date: 06/11/1952    Quit date: 06/11/2020    Years since quitting: 4.2   Smokeless tobacco: Never  Substance and Sexual Activity   Alcohol use: Not Currently   Drug use: Not Currently   Sexual activity: Not on file  Other Topics Concern   Not on file  Social History Narrative   Not on file   Social Drivers of Health   Financial Resource Strain: Not on file  Food Insecurity: No Food Insecurity (03/17/2024)   Hunger Vital Sign    Worried About Running Out of Food in the Last Year: Never true    Ran Out of Food in the Last Year: Never true  Transportation Needs: No Transportation Needs (03/17/2024)   PRAPARE - Administrator, Civil Service (Medical): No    Lack of Transportation (  Non-Medical): No  Physical Activity: Insufficiently Active (12/25/2022)   Exercise Vital Sign    Days of Exercise per Week: 7 days    Minutes of Exercise per Session: 20 min  Stress: Stress Concern Present (12/25/2022)   [employer] of Occupational Health - Occupational Stress Questionnaire    Feeling of Stress : Very much  Social Connections: Socially Isolated (03/17/2024)   Social Connection and Isolation Panel    Frequency of Communication with Friends and Family: More than three times a week    Frequency of Social Gatherings with Friends and Family: Never    Attends Religious Services: Never    Database Administrator or  Organizations: No    Attends Banker Meetings: Never    Marital Status: Never married  Intimate Partner Violence: Not At Risk (03/17/2024)   Humiliation, Afraid, Rape, and Kick questionnaire    Fear of Current or Ex-Partner: No    Emotionally Abused: No    Physically Abused: No    Sexually Abused: No    Family History  Problem Relation Age of Onset   Breast cancer Mother    CVA Father    Heart disease Father    Arthritis/Rheumatoid Sister    Breast cancer Paternal Grandmother     Allergies  Allergen Reactions   Spironolactone Anaphylaxis   Fluogen [Influenza Virus Vaccine] Hives   Statins     Dizziness & weakness   Sulfa Antibiotics Other (See Comments)    Unknown, happened as a child    Review of Systems  All other systems reviewed and are negative.      Objective:   BP (!) 142/82   Pulse 69   Ht 5' 6 (1.676 m)   Wt 88 lb (39.9 kg)   SpO2 95%   BMI 14.20 kg/m   Vitals:   02/04/24 1311  BP: (!) 142/82  Pulse: 69  Height: 5' 6 (1.676 m)  Weight: 88 lb (39.9 kg)  SpO2: 95%  BMI (Calculated): 14.21    Physical Exam Vitals and nursing note reviewed.  Constitutional:      Appearance: Normal appearance. She is normal weight.  HENT:     Head: Normocephalic.  Eyes:     Extraocular Movements: Extraocular movements intact.     Conjunctiva/sclera: Conjunctivae normal.     Pupils: Pupils are equal, round, and reactive to light.  Cardiovascular:     Rate and Rhythm: Normal rate and regular rhythm.  Pulmonary:     Effort: Pulmonary effort is normal.  Neurological:     General: No focal deficit present.     Mental Status: She is alert and oriented to person, place, and time. Mental status is at baseline.  Psychiatric:        Mood and Affect: Mood normal.        Behavior: Behavior normal.        Thought Content: Thought content normal.        Judgment: Judgment normal.      No results found for any visits on 02/04/24.   No results  found for this or any previous visit (from the past 2160 hours).      Assessment & Plan Acute midline thoracic back pain Chronic low back pain (1ry area of Pain) (Bilateral) (R>L) w/ sciatica (Bilateral) Chronic lower extremity pain (2ry area of Pain) (Bilateral) (R>L)  Chronic systolic CHF (congestive heart failure) (HCC) Patient is seen by cardiology, who manage this condition.  She is well controlled with  current therapy.   Will defer to them for further changes to plan of care.  Venous insufficiency (chronic) (peripheral) Toe necrosis (HCC) Patient is seen by Vascular, who manage this condition.  She is well controlled with current therapy.   Will defer to them for further changes to plan of care.  Underweight Protein-calorie malnutrition, severe Encouraged increased calorie intake to manage her nutritional needs, if necessary to utilize supplements as needed.     Return as previously scheduled.   Total time spent: 20 minutes  ALAN CHRISTELLA ARRANT, FNP  02/04/2024   This document may have been prepared by [facility] Voice Recognition software and as such may include unintentional dictation errors.

## 2024-09-04 NOTE — Assessment & Plan Note (Signed)
Patient is seen by cardiology, who manage this condition.  She is well controlled with current therapy.   Will defer to them for further changes to plan of care.

## 2024-09-07 ENCOUNTER — Encounter: Payer: Self-pay | Admitting: Medical Oncology

## 2024-09-07 ENCOUNTER — Emergency Department

## 2024-09-07 ENCOUNTER — Other Ambulatory Visit: Payer: Self-pay

## 2024-09-07 ENCOUNTER — Emergency Department
Admission: EM | Admit: 2024-09-07 | Discharge: 2024-09-07 | Disposition: A | Discharge status: Skilled Nursing Facility | Source: Ambulatory Visit | Attending: Emergency Medicine | Admitting: Emergency Medicine

## 2024-09-07 DIAGNOSIS — R600 Localized edema: Secondary | ICD-10-CM | POA: Insufficient documentation

## 2024-09-07 DIAGNOSIS — M5030 Other cervical disc degeneration, unspecified cervical region: Secondary | ICD-10-CM | POA: Diagnosis not present

## 2024-09-07 DIAGNOSIS — W1839XA Other fall on same level, initial encounter: Secondary | ICD-10-CM | POA: Insufficient documentation

## 2024-09-07 DIAGNOSIS — W19XXXA Unspecified fall, initial encounter: Secondary | ICD-10-CM

## 2024-09-07 DIAGNOSIS — M79605 Pain in left leg: Secondary | ICD-10-CM

## 2024-09-07 DIAGNOSIS — I110 Hypertensive heart disease with heart failure: Secondary | ICD-10-CM | POA: Diagnosis not present

## 2024-09-07 DIAGNOSIS — G894 Chronic pain syndrome: Secondary | ICD-10-CM | POA: Diagnosis not present

## 2024-09-07 DIAGNOSIS — Z9181 History of falling: Secondary | ICD-10-CM | POA: Diagnosis present

## 2024-09-07 DIAGNOSIS — L0390 Cellulitis, unspecified: Secondary | ICD-10-CM | POA: Diagnosis not present

## 2024-09-07 DIAGNOSIS — I10 Essential (primary) hypertension: Secondary | ICD-10-CM | POA: Diagnosis not present

## 2024-09-07 DIAGNOSIS — I272 Pulmonary hypertension, unspecified: Secondary | ICD-10-CM | POA: Diagnosis not present

## 2024-09-07 DIAGNOSIS — Z7901 Long term (current) use of anticoagulants: Secondary | ICD-10-CM | POA: Diagnosis not present

## 2024-09-07 DIAGNOSIS — I509 Heart failure, unspecified: Secondary | ICD-10-CM | POA: Diagnosis not present

## 2024-09-07 DIAGNOSIS — M7989 Other specified soft tissue disorders: Secondary | ICD-10-CM

## 2024-09-07 DIAGNOSIS — M79662 Pain in left lower leg: Secondary | ICD-10-CM | POA: Diagnosis not present

## 2024-09-07 DIAGNOSIS — I482 Chronic atrial fibrillation, unspecified: Secondary | ICD-10-CM | POA: Diagnosis not present

## 2024-09-07 DIAGNOSIS — R9082 White matter disease, unspecified: Secondary | ICD-10-CM | POA: Diagnosis not present

## 2024-09-07 DIAGNOSIS — M47812 Spondylosis without myelopathy or radiculopathy, cervical region: Secondary | ICD-10-CM | POA: Diagnosis not present

## 2024-09-07 DIAGNOSIS — N179 Acute kidney failure, unspecified: Secondary | ICD-10-CM | POA: Insufficient documentation

## 2024-09-07 DIAGNOSIS — S0990XA Unspecified injury of head, initial encounter: Secondary | ICD-10-CM | POA: Diagnosis present

## 2024-09-07 LAB — CBC WITH DIFFERENTIAL/PLATELET
Abs Immature Granulocytes: 0.02 K/uL (ref 0.00–0.07)
Basophils Absolute: 0 K/uL (ref 0.0–0.1)
Basophils Relative: 0 %
Eosinophils Absolute: 0 K/uL (ref 0.0–0.5)
Eosinophils Relative: 1 %
HCT: 38 % (ref 36.0–46.0)
Hemoglobin: 11.9 g/dL — ABNORMAL LOW (ref 12.0–15.0)
Immature Granulocytes: 0 %
Lymphocytes Relative: 10 %
Lymphs Abs: 0.6 K/uL — ABNORMAL LOW (ref 0.7–4.0)
MCH: 30.6 pg (ref 26.0–34.0)
MCHC: 31.3 g/dL (ref 30.0–36.0)
MCV: 97.7 fL (ref 80.0–100.0)
Monocytes Absolute: 0.7 K/uL (ref 0.1–1.0)
Monocytes Relative: 10 %
Neutro Abs: 5.2 K/uL (ref 1.7–7.7)
Neutrophils Relative %: 79 %
Platelets: 209 K/uL (ref 150–400)
RBC: 3.89 MIL/uL (ref 3.87–5.11)
RDW: 13.8 % (ref 11.5–15.5)
WBC: 6.6 K/uL (ref 4.0–10.5)
nRBC: 0 % (ref 0.0–0.2)

## 2024-09-07 LAB — BASIC METABOLIC PANEL WITH GFR
Anion gap: 13 (ref 5–15)
BUN: 30 mg/dL — ABNORMAL HIGH (ref 8–23)
CO2: 28 mmol/L (ref 22–32)
Calcium: 8.8 mg/dL — ABNORMAL LOW (ref 8.9–10.3)
Chloride: 95 mmol/L — ABNORMAL LOW (ref 98–111)
Creatinine, Ser: 1.34 mg/dL — ABNORMAL HIGH (ref 0.44–1.00)
GFR, Estimated: 38 mL/min — ABNORMAL LOW (ref >60)
Glucose, Bld: 94 mg/dL (ref 70–99)
Potassium: 4.8 mmol/L (ref 3.5–5.1)
Sodium: 136 mmol/L (ref 135–145)

## 2024-09-07 LAB — BRAIN NATRIURETIC PEPTIDE: B Natriuretic Peptide: 1286.4 pg/mL — ABNORMAL HIGH (ref 0.0–100.0)

## 2024-09-07 MED ORDER — ACETAMINOPHEN 500 MG PO TABS
1000.0000 mg | ORAL_TABLET | Freq: Once | ORAL | Status: AC
  Administered 2024-09-07: 1000 mg via ORAL
  Filled 2024-09-07: qty 2

## 2024-09-07 NOTE — ED Notes (Signed)
Pt taken for scans 

## 2024-09-07 NOTE — ED Notes (Signed)
 Pt up to use restroom with RN Burnard and [facility]. Pt placed back in bed, reconnected to monitor. Pt asking how much longer cause I am ready to go home? Pt informed US  has resulted, should not be much longer before the Dr comes to speak with pt.

## 2024-09-07 NOTE — ED Provider Notes (Signed)
 [facility] Provider Note    Event Date/Time   First MD Initiated Contact with Patient 09/07/24 1328     (approximate)   History   Leg Swelling   HPI  [name] [name] is a 89 y.o. female history of chronic back pain, chronic pain syndrome, hypertension, atrial fibrillation on Plavix , CHF on furosemide , presenting with left lower extremity pain and swelling.  Patient states that she had a fall a week ago, did hit her leg.  Denies any weakness or numbness, no chest pain or shortness of breath.  Did say that she hit her head when she fell.  No focal weakness or numbness, no slurred speech.  Per independent history from EMS, she was brought in because facility was concerned about her left lower extremity swelling and redness.     Physical Exam   Triage Vital Signs: ED Triage Vitals  Encounter Vitals Group     BP      Girls Systolic BP Percentile      Girls Diastolic BP Percentile      Boys Systolic BP Percentile      Boys Diastolic BP Percentile      Pulse      Resp      Temp      Temp src      SpO2      Weight      Height      Head Circumference      Peak Flow      Pain Score      Pain Loc      Pain Education      Exclude from Growth Chart     Most recent vital signs: Vitals:   09/07/24 1332 09/07/24 1400  BP:  (!) 158/94  Pulse:  85  Resp:  14  Temp:    SpO2: 93% 99%     General: Awake, no distress.  CV:  Good peripheral perfusion.  Resp:  Normal effort.  No tachypnea or respiratory distress Abd:  No distention.  Soft nontender Other:  No palpable skull deformities or tenderness, no midline spinal tenderness, able to range her extremities.  She does have bilateral lower extremity edema, her left lower extremity is slightly larger than the right, there is ecchymoses to her left anterior shin, scabbed over wound on her left lateral shin without purulent drainage.  Chronic venous stasis changes to her bilateral lower extremities,  erythema noted to her left leg`   ED Results / Procedures / Treatments   Labs (all labs ordered are listed, but only abnormal results are displayed) Labs Reviewed  BASIC METABOLIC PANEL WITH GFR - Abnormal; Notable for the following components:      Result Value   Chloride 95 (*)    BUN 30 (*)    Creatinine, Ser 1.34 (*)    Calcium  8.8 (*)    GFR, Estimated 38 (*)    All other components within normal limits  CBC WITH DIFFERENTIAL/PLATELET - Abnormal; Notable for the following components:   Hemoglobin 11.9 (*)    Lymphs Abs 0.6 (*)    All other components within normal limits  BRAIN NATRIURETIC PEPTIDE - Abnormal; Notable for the following components:   B Natriuretic Peptide 1,286.4 (*)    All other components within normal limits     EKG  EKG shows, atrial fibrillation, baseline is wandering due to patient's movement, rate 71, normal QRS, T wave flattening in 1, no obvious ischemic ST elevation, not  significantly changed compared to prior   RADIOLOGY On my independent interpretation, CT without intracranial hemorrhage   PROCEDURES:  Critical Care performed: No  Procedures   MEDICATIONS ORDERED IN ED: Medications  acetaminophen  (TYLENOL ) tablet 1,000 mg (1,000 mg Oral Given 09/07/24 1358)     IMPRESSION / MDM / ASSESSMENT AND PLAN / ED COURSE  I reviewed the triage vital signs and the nursing notes.                              Differential diagnosis includes, but is not limited to, DVT, hematoma, fracture, strain, sprain, cellulitis.  For her fall did consider intracranial hemorrhage, fracture.  Will get CT head, cervical spine, x-ray of the leg, DVT ultrasound, labs.  Will give her Tylenol  here.  Patient's presentation is most consistent with acute presentation with potential threat to life or bodily function.  Independent interpretation of labs and imaging below.  Imaging so far has been reassuring, no acute traumatic findings.  She is still pending DVT  ultrasound.  Patient signed out pending ultrasound, if negative, likely able to be discharged with antibiotics for cellulitis.    Clinical Course as of 09/07/24 1510  Tue Sep 07, 2024  1415 CT Head Wo Contrast IMPRESSION: 1. No acute intracranial abnormality. 2. Diffuse cerebral atrophy and chronic small vessel disease throughout the deep white matter. 3. Small chronic infarct in the anterior medial left frontal lobe.   [TT]  1419 Independent review of labs, BNP is elevated, patient has history of CHF but no shortness of breath at this time, she has a mild AKI, rest of electrolytes not severely deranged, no leukocytosis. [TT]  1419 DG Tibia/Fibula Left 1. No significant abnormality.  [TT]  1428 CT Cervical Spine Wo Contrast IMPRESSION: 1. No acute abnormality of the cervical spine. 2. Degenerative changes of the cervical spine   [TT]  1449 Received call from radiology, they noted a mild compression fracture to T4 this was not seen on prior CT imaging.  Patient has no midline spinal tenderness, no focal weakness or numbness.  Suspect this might be more chronic. [TT]    Clinical Course User Index [TT] Waymond Lorelle Cummins, MD     FINAL CLINICAL IMPRESSION(S) / ED DIAGNOSES   Final diagnoses:  Leg edema  Leg swelling  Fall, initial encounter  Pain of left lower extremity     Rx / DC Orders   ED Discharge Orders     None        Note:  This document was prepared using Dragon voice recognition software and may include unintentional dictation errors.    Waymond Lorelle Cummins, MD 09/07/24 [phone]

## 2024-09-07 NOTE — ED Triage Notes (Signed)
 Pt from the [facility] via ems with reports that pt began having redness and swelling to left lower ext today. Pt reports that it has been for a few days. Had a fall a week ago. Dementia at baseline.

## 2024-09-07 NOTE — ED Notes (Signed)
 Pt sister Ronal called for update. [name] asking to let pt know to call her when she returns to her faiclity. Message passed to pt by this RN.

## 2024-09-07 NOTE — ED Notes (Signed)
 Pt sister, Ronal called for update. Ronal states she is POA for pt, has a husband on hospice due to pancreatic cancer and custody of 87yo grand daughter. Update given and Ronal asking for a call with any new info/labs/scans.

## 2024-09-07 NOTE — ED Provider Notes (Signed)
-----------------------------------------   3:05 PM on 09/07/2024 -----------------------------------------  Blood pressure (!) 158/94, pulse 85, temperature 97.9 F (36.6 C), temperature source Oral, resp. rate 14, height 5' 6 (1.676 m), weight 48 kg, SpO2 99%.  Assuming care from Dr. Waymond.  In short, [name] [name] is a 89 y.o. female with a chief complaint of Leg Swelling .  Refer to the original H&P for additional details.  The current plan of care is to follow-up lower extremity US  for DVT, plan for treatment of cellulitis if unremarkable.  ----------------------------------------- 6:00 PM on 09/07/2024 ----------------------------------------- Ultrasound of left lower extremity is negative for DVT and patient is requesting to be discharged home.  She was counseled to follow-up with her PCP and to return to the ED for new or worsening symptoms, patient agrees with plan.   [name], MD 09/07/24 [phone]

## 2024-09-07 NOTE — ED Notes (Signed)
 Pt asking to speak with Dr. Dr Willo made aware and is at bedside.

## 2024-09-07 NOTE — ED Notes (Signed)
 [facility]  called for  transport to  the  [place] [facility]  of  [address]

## 2024-09-16 ENCOUNTER — Ambulatory Visit (INDEPENDENT_AMBULATORY_CARE_PROVIDER_SITE_OTHER)

## 2024-09-16 DIAGNOSIS — I872 Venous insufficiency (chronic) (peripheral): Secondary | ICD-10-CM

## 2024-09-16 DIAGNOSIS — L578 Other skin changes due to chronic exposure to nonionizing radiation: Secondary | ICD-10-CM | POA: Diagnosis not present

## 2024-09-16 DIAGNOSIS — L858 Other specified epidermal thickening: Secondary | ICD-10-CM

## 2024-09-16 DIAGNOSIS — Z85828 Personal history of other malignant neoplasm of skin: Secondary | ICD-10-CM | POA: Diagnosis not present

## 2024-09-16 MED ORDER — TRIAMCINOLONE ACETONIDE 0.1 % EX OINT: TOPICAL_OINTMENT | CUTANEOUS | 5 refills | Status: AC

## 2024-09-16 NOTE — Progress Notes (Signed)
    Subjective   [name] [name] is a 89 y.o. female who presents for the following: Lesion(s) of concern . Patient is new patient.  Today patient reports: Skin lesions at bilateral lower legs, patient lives at [facility] of [address] and sister was just told she needed to be seen. [facility] and sister states was treated with radiation.  Sister is with patient and contributes to history.  Review of Systems:    No other skin or systemic complaints except as noted in HPI or Assessment and Plan.  The following portions of the chart were reviewed this encounter and updated as appropriate: medications, allergies, medical history  Relevant Medical History:  Personal history of non melanoma skin cancer - see medical history for full details  CHF  Hx of NSTEMI   Objective  (SKPE) Well appearing patient in no apparent distress; mood and affect are within normal limits. 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  - Discussed wound care w/ vaseline, non stick bandages for open wounds    Chronic  and persistent condition with duration or expected duration over one year. Condition is symptomatic and bothersome to patient. Patient is flaring and not currently at treatment goal.  and Condition requires long term medication management.  Patient is using long term (months to years) prescription medication  to control their dermatologic condition.  These medications require periodic monitoring to evaluate for efficacy and side effects and may require periodic laboratory monitoring.    Patient instructions (SKPI)   Procedures, orders, diagnosis for this visit:    There are no diagnoses linked to this encounter.  Return to clinic: Return for 6-8 weeks for stasis dermatits , w/ Dr. Raymund.  I, Jacquelynn V. Wilfred, CMA, am acting as scribe for Lauraine JAYSON Raymund, MD.  Documentation: I have reviewed the above documentation for accuracy and completeness, and I agree with the above.  Lauraine JAYSON Raymund, MD

## 2024-09-16 NOTE — Patient Instructions (Addendum)
 Severe stasis dermatitis of lower legs with retention hyperkeratosis   - Continue elevation and compression - Start triamcinolone 0.1% ointment daily for any areas that itch  - For open wounds (on L lateral leg) recommend covering with vaseline, followed by non stick pad  - Recommend over the counter Amlactin cream (contains urea) for thick scale at legs  Steroid Use  We prescribed you a topical steroid at today's visit.   General application instructions: -Apply this to any affected skin areas, twice (2 times) daily, for two (2) weeks -If the areas are better, you can stop -Re-start the topical steroid if the areas come back, or flare -If the areas don't get better after two weeks, we sometimes recommend taking a break for one (1) week, before restarting for another two (2) weeks. Repeat as needed  The most common side effects of topical steroid medications include changes in skin pigment and thinning of the skin. If the steroid is only applied to affected areas of the skin, these effects rarely occur unless the steroid is used for a very long time (years without stopping).   If we prescribed you a strong steroid, please avoid applying to face, groin, or neck, unless we tell you otherwise. We will include more detail in your prescription instructions.   Due to recent changes in healthcare laws, you may see results of your pathology and/or laboratory studies on MyChart before the doctors have had a chance to review them. We understand that in some cases there may be results that are confusing or concerning to you. Please understand that not all results are received at the same time and often the doctors may need to interpret multiple results in order to provide you with the best plan of care or course of treatment. Therefore, we ask that you please give us  2 business days to thoroughly review all your results before contacting the office for clarification. Should we see a critical lab  result, you will be contacted sooner.   If You Need Anything After Your Visit  If you have any questions or concerns for your doctor, please call our main line at [phone] and press option 4 to reach your doctor's medical assistant. If no one answers, please leave a voicemail as directed and we will return your call as soon as possible. Messages left after 4 pm will be answered the following business day.   You may also send us  a message via MyChart. We typically respond to MyChart messages within 1-2 business days.  For prescription refills, please ask your pharmacy to contact our office. Our fax number is [phone].  If you have an urgent issue when the clinic is closed that cannot wait until the next business day, you can page your doctor at the number below.    Please note that while we do our best to be available for urgent issues outside of office hours, we are not available 24/7.   If you have an urgent issue and are unable to reach us , you may choose to seek medical care at your doctor's office, retail clinic, urgent care center, or emergency room.  If you have a medical emergency, please immediately call 911 or go to the emergency department.  Pager Numbers  - Dr. Hester: [phone]  - Dr. Jackquline: [phone]  - Dr. Claudene: [phone]   In the event of inclement weather, please call our main line at [phone] for an update on the status of any delays or closures.  Dermatology  Medication Tips: Please keep the boxes that topical medications come in in order to help keep track of the instructions about where and how to use these. Pharmacies typically print the medication instructions only on the boxes and not directly on the medication tubes.   If your medication is too expensive, please contact our office at [phone] option 4 or send us  a message through MyChart.   We are unable to tell what your co-pay for medications will be in advance as this is  different depending on your insurance coverage. However, we may be able to find a substitute medication at lower cost or fill out paperwork to get insurance to cover a needed medication.   If a prior authorization is required to get your medication covered by your insurance company, please allow us  1-2 business days to complete this process.  Drug prices often vary depending on where the prescription is filled and some pharmacies may offer cheaper prices.  The website www.goodrx.com contains coupons for medications through different pharmacies. The prices here do not account for what the cost may be with help from insurance (it may be cheaper with your insurance), but the website can give you the price if you did not use any insurance.  - You can print the associated coupon and take it with your prescription to the pharmacy.  - You may also stop by our office during regular business hours and pick up a GoodRx coupon card.  - If you need your prescription sent electronically to a different pharmacy, notify our office through [facility] or by phone at [phone] option 4.     Si Usted Necesita Algo Despus de Su Visita  Tambin puede enviarnos un mensaje a travs de Clinical Cytogeneticist. Por lo general respondemos a los mensajes de MyChart en el transcurso de 1 a 2 das hbiles.  Para renovar recetas, por favor pida a su farmacia que se ponga en contacto con nuestra oficina. Randi lakes de fax es [place] [phone].  Si tiene un asunto urgente cuando la clnica est cerrada y que no puede esperar hasta el siguiente da hbil, puede llamar/localizar a su doctor(a) al nmero que aparece a continuacin.   Por favor, tenga en cuenta que aunque hacemos todo lo posible para estar disponibles para asuntos urgentes fuera del horario de [place], no estamos disponibles las 24 horas del da, los [phone] de la [place].   Si tiene un problema urgente y no puede comunicarse con nosotros, puede optar por buscar  atencin mdica  en el consultorio de su doctor(a), en una clnica privada, en un centro de atencin urgente o en una sala de emergencias.  Si tiene engineer, drilling, por favor llame inmediatamente al 911 o vaya a la sala de emergencias.  Nmeros de bper  - Dr. Hester: [phone]  - Dra. Jackquline: [phone]  - Dr. Claudene: [phone]   En caso de inclemencias del tiempo, por favor llame a landry capes principal al [phone] para una actualizacin sobre el [place] de cualquier retraso o cierre.  Consejos para la medicacin en dermatologa: Por favor, guarde las cajas en las que vienen los medicamentos de uso tpico para ayudarle a seguir las instrucciones sobre dnde y cmo usarlos. Las farmacias generalmente imprimen las instrucciones del medicamento slo en las cajas y no directamente en los tubos del [place].   Si su medicamento es muy caro, por favor, pngase en contacto con landry rieger llamando al [phone] y presione la opcin 4 o envenos un  mensaje a travs de MyChart.   No podemos decirle cul ser su copago por los medicamentos por adelantado ya que esto es diferente dependiendo de la cobertura de su seguro. Sin embargo, es posible que podamos encontrar un medicamento sustituto a audiological scientist un formulario para que el seguro cubra el medicamento que se considera necesario.   Si se requiere una autorizacin previa para que su compaa de seguros cubra su medicamento, por favor permtanos de 1 a 2 das hbiles para completar este proceso.  Los precios de los medicamentos varan con frecuencia dependiendo del environmental consultant de dnde se surte la receta y alguna farmacias pueden ofrecer precios ms baratos.  El sitio web www.goodrx.com tiene cupones para medicamentos de health and safety inspector. Los precios aqu no tienen en cuenta lo que podra costar con la ayuda del seguro (puede ser ms barato con su seguro), pero el sitio web puede darle el precio si no utiliz  tourist information centre manager.  - Puede imprimir el cupn correspondiente y llevarlo con su receta a la farmacia.  - Tambin puede pasar por nuestra oficina durante el horario de atencin regular y education officer, museum una tarjeta de cupones de GoodRx.  - Si necesita que su receta se enve electrnicamente a una farmacia diferente, informe a nuestra oficina a travs de MyChart de [place] o por telfono llamando al [phone] y presione la opcin 4.

## 2024-10-25 ENCOUNTER — Other Ambulatory Visit: Payer: Self-pay

## 2024-10-25 ENCOUNTER — Emergency Department

## 2024-10-25 ENCOUNTER — Emergency Department
Admission: EM | Admit: 2024-10-25 | Discharge: 2024-10-25 | Disposition: A | Discharge status: Skilled Nursing Facility | Source: Home / Self Care | Attending: Emergency Medicine | Admitting: Emergency Medicine

## 2024-10-25 ENCOUNTER — Telehealth: Payer: Self-pay | Admitting: Family

## 2024-10-25 DIAGNOSIS — F0390 Unspecified dementia without behavioral disturbance: Secondary | ICD-10-CM | POA: Diagnosis not present

## 2024-10-25 DIAGNOSIS — W19XXXA Unspecified fall, initial encounter: Secondary | ICD-10-CM

## 2024-10-25 DIAGNOSIS — S51811A Laceration without foreign body of right forearm, initial encounter: Secondary | ICD-10-CM | POA: Diagnosis not present

## 2024-10-25 DIAGNOSIS — S81812A Laceration without foreign body, left lower leg, initial encounter: Secondary | ICD-10-CM | POA: Diagnosis not present

## 2024-10-25 DIAGNOSIS — S0990XA Unspecified injury of head, initial encounter: Secondary | ICD-10-CM | POA: Diagnosis not present

## 2024-10-25 DIAGNOSIS — W1809XA Striking against other object with subsequent fall, initial encounter: Secondary | ICD-10-CM | POA: Diagnosis not present

## 2024-10-25 DIAGNOSIS — Z7902 Long term (current) use of antithrombotics/antiplatelets: Secondary | ICD-10-CM | POA: Diagnosis not present

## 2024-10-25 DIAGNOSIS — T148XXA Other injury of unspecified body region, initial encounter: Secondary | ICD-10-CM

## 2024-10-25 DIAGNOSIS — S59911A Unspecified injury of right forearm, initial encounter: Secondary | ICD-10-CM | POA: Diagnosis present

## 2024-10-25 LAB — BASIC METABOLIC PANEL WITH GFR
Anion gap: 12 (ref 5–15)
BUN: 50 mg/dL — ABNORMAL HIGH (ref 8–23)
CO2: 25 mmol/L (ref 22–32)
Calcium: 9 mg/dL (ref 8.9–10.3)
Chloride: 99 mmol/L (ref 98–111)
Creatinine, Ser: 1.33 mg/dL — ABNORMAL HIGH (ref 0.44–1.00)
GFR, Estimated: 39 mL/min — ABNORMAL LOW (ref >60)
Glucose, Bld: 99 mg/dL (ref 70–99)
Potassium: 4.7 mmol/L (ref 3.5–5.1)
Sodium: 135 mmol/L (ref 135–145)

## 2024-10-25 LAB — CBC
HCT: 37.2 % (ref 36.0–46.0)
Hemoglobin: 11 g/dL — ABNORMAL LOW (ref 12.0–15.0)
MCH: 31.4 pg (ref 26.0–34.0)
MCHC: 29.6 g/dL — ABNORMAL LOW (ref 30.0–36.0)
MCV: 106.3 fL — ABNORMAL HIGH (ref 80.0–100.0)
Platelets: 194 K/uL (ref 150–400)
RBC: 3.5 MIL/uL — ABNORMAL LOW (ref 3.87–5.11)
RDW: 13.3 % (ref 11.5–15.5)
WBC: 5.2 K/uL (ref 4.0–10.5)
nRBC: 0 % (ref 0.0–0.2)

## 2024-10-25 MED ORDER — SODIUM CHLORIDE 0.9 % IV BOLUS
500.0000 mL | Freq: Once | INTRAVENOUS | Status: AC
  Administered 2024-10-25: 02:00:00 500 mL via INTRAVENOUS

## 2024-10-25 NOTE — ED Notes (Signed)
 Pt's bloody leg cleaned, notable 4 cm laceration to left mid tibia (static). Applied dressing with Coban.

## 2024-10-25 NOTE — Telephone Encounter (Signed)
 Chyrl, nurse with [facility], left VM requesting verbal orders to continue nursing.   Verbal orders given to [facility].

## 2024-10-25 NOTE — ED Provider Notes (Signed)
 [facility] Provider Note    Event Date/Time   First MD Initiated Contact with Patient 10/25/24 0023     (approximate)   History   Fall   HPI  [name] [name] is a 89 y.o. female with a history of dementia apparently on Plavix  who presents after fall with head injury.  Patient reports she hit her head when she fell.  She suffered a skin tear to the right forearm and left lower leg anteriorly but otherwise no injuries.  She denies back pain.  No hip injury.     Physical Exam   Triage Vital Signs: ED Triage Vitals  Encounter Vitals Group     BP 10/25/24 0025 (!) 160/101     Girls Systolic BP Percentile --      Girls Diastolic BP Percentile --      Boys Systolic BP Percentile --      Boys Diastolic BP Percentile --      Pulse Rate 10/25/24 0025 90     Resp 10/25/24 0025 20     Temp 10/25/24 0025 98.2 F (36.8 C)     Temp Source 10/25/24 0025 Oral     SpO2 10/25/24 0025 100 %     Weight --      Height 10/25/24 0022 1.651 m (5' 5)     Head Circumference --      Peak Flow --      Pain Score --      Pain Loc --      Pain Education --      Exclude from Growth Chart --     Most recent vital signs: Vitals:   10/25/24 0430 10/25/24 0500  BP: (!) 151/92 (!) 150/77  Pulse: 93 95  Resp: 11 13  Temp: 97.9 F (36.6 C) 98.1 F (36.7 C)  SpO2: 100% 99%     General: Awake, no distress.  CV:  Good peripheral perfusion.  Resp:  Normal effort.  Clear to auscultation bilaterally Abd:  No distention.  Soft, nontender Other:  Normal range of motion of all extremities.  No pain with axial load on both hips.  No vertebral tenderness to palpation  Patient has erythema to the lower extremities bilaterally, this appears to be chronic, does not appear to be cellulitis.  Afebrile   ED Results / Procedures / Treatments   Labs (all labs ordered are listed, but only abnormal results are displayed) Labs Reviewed  CBC - Abnormal; Notable for the following  components:      Result Value   RBC 3.50 (*)    Hemoglobin 11.0 (*)    MCV 106.3 (*)    MCHC 29.6 (*)    All other components within normal limits  BASIC METABOLIC PANEL WITH GFR - Abnormal; Notable for the following components:   BUN 50 (*)    Creatinine, Ser 1.33 (*)    GFR, Estimated 39 (*)    All other components within normal limits     EKG  ED ECG REPORT I, Lamar Price, the attending physician, personally viewed and interpreted this ECG.  Date: 10/25/2024  Rhythm: Atrial fibrillation QRS Axis: normal Intervals: normal ST/T Wave abnormalities: normal Narrative Interpretation: no evidence of acute ischemia    RADIOLOGY     PROCEDURES:  Critical Care performed:   Procedures   MEDICATIONS ORDERED IN ED: Medications  sodium chloride  0.9 % bolus 500 mL (0 mLs Intravenous Stopped 10/25/24 0252)     IMPRESSION / MDM /  ASSESSMENT AND PLAN / ED COURSE  I reviewed the triage vital signs and the nursing notes. Patient's presentation is most consistent with acute presentation with potential threat to life or bodily function.  Patient presents after unwitnessed fall with head injury, she is on Plavix .  No head laceration or hematoma noted.  Will send for CT head, cervical spine.  EMS placed an IV, will check CBC and BMP  Will dress wounds  CT scan is reassuring, lab work unchanged from prior, no indication for admission, appropriate for discharge        FINAL CLINICAL IMPRESSION(S) / ED DIAGNOSES   Final diagnoses:  Fall, initial encounter  Injury of head, initial encounter  Multiple skin tears     Rx / DC Orders   ED Discharge Orders     None        Note:  This document was prepared using Dragon voice recognition software and may include unintentional dictation errors.   Arlander Charleston, MD 10/25/24 [phone]

## 2024-10-25 NOTE — ED Triage Notes (Addendum)
 BIB [facility] from the [facility] of  for unwitnessed fall. no thinners per EMS but per her chart she is on PLAVIX . Pt did hit her head. No LOC. Skin tear to R FA and left shin. Staff wanted her sent out for possible right leg infection. HX of dementia and confused at baseline.  15 RR  100% RA  71 HR  167/96

## 2024-10-25 NOTE — Discharge Instructions (Addendum)
 Patient had reassuring workup, CT head and cervical spine were unremarkable, lab work was reassuring, do not think that she has a lower extremity infection as her skin changes appear chronic.  Please change dressings daily

## 2024-10-25 NOTE — ED Notes (Signed)
 Redressed right anteriomedial forearm wound with nonadherent gauze and Coban.

## 2024-10-28 ENCOUNTER — Ambulatory Visit

## 2024-10-28 DIAGNOSIS — D492 Neoplasm of unspecified behavior of bone, soft tissue, and skin: Secondary | ICD-10-CM

## 2024-10-28 DIAGNOSIS — I872 Venous insufficiency (chronic) (peripheral): Secondary | ICD-10-CM | POA: Diagnosis not present

## 2024-10-28 DIAGNOSIS — S41111A Laceration without foreign body of right upper arm, initial encounter: Secondary | ICD-10-CM | POA: Diagnosis not present

## 2024-10-28 DIAGNOSIS — C4492 Squamous cell carcinoma of skin, unspecified: Secondary | ICD-10-CM

## 2024-10-28 DIAGNOSIS — L578 Other skin changes due to chronic exposure to nonionizing radiation: Secondary | ICD-10-CM

## 2024-10-28 DIAGNOSIS — C44622 Squamous cell carcinoma of skin of right upper limb, including shoulder: Secondary | ICD-10-CM

## 2024-10-28 DIAGNOSIS — R296 Repeated falls: Secondary | ICD-10-CM

## 2024-10-28 DIAGNOSIS — L858 Other specified epidermal thickening: Secondary | ICD-10-CM

## 2024-10-28 HISTORY — DX: Squamous cell carcinoma of skin, unspecified: C44.92

## 2024-10-28 NOTE — Patient Instructions (Addendum)
 The bandage should remain in place until tomorrow. Remove the bandages and clean the wound once daily as follows:  - Wash your hands. Clean the wound gently with soap and water, and then pat dry. Do not rub. Apply a small amount of Petroleum Jelly or Vaseline. Cover the area with a Band-Aid.  A small amount of bleeding is normal. If bleeding persists, apply firm pressure over the bandage for 5 to 10 minutes without interruption. If bleeding continues, call our office. Continue to clean the area as directed above until the wound is healed. Shave biopsies may take several weeks to heal. It is normal if the edges are pink/red and the center is slight yellowish or white in color. However if the site becomes hot, swollen, has a thick drainage or redness that expands away from the site please call us . We will contact you with results once available.    Due to recent changes in healthcare laws, you may see results of your pathology and/or laboratory studies on MyChart before the doctors have had a chance to review them. We understand that in some cases there may be results that are confusing or concerning to you. Please understand that not all results are received at the same time and often the doctors may need to interpret multiple results in order to provide you with the best plan of care or course of treatment. Therefore, we ask that you please give us  2 business days to thoroughly review all your results before contacting the office for clarification. Should we see a critical lab result, you will be contacted sooner.   If You Need Anything After Your Visit  If you have any questions or concerns for your doctor, please call our main line at [phone] and press option 4 to reach your doctor's medical assistant. If no one answers, please leave a voicemail as directed and we will return your call as soon as possible. Messages left after 4 pm will be answered the following business day.   You may also send us  a  message via MyChart. We typically respond to MyChart messages within 1-2 business days.  For prescription refills, please ask your pharmacy to contact our office. Our fax number is [phone].  If you have an urgent issue when the clinic is closed that cannot wait until the next business day, you can page your doctor at the number below.    Please note that while we do our best to be available for urgent issues outside of office hours, we are not available 24/7.   If you have an urgent issue and are unable to reach us , you may choose to seek medical care at your doctor's office, retail clinic, urgent care center, or emergency room.  If you have a medical emergency, please immediately call 911 or go to the emergency department.  Pager Numbers  - Dr. Hester: [phone]  - Dr. Jackquline: [phone]  - Dr. Claudene: [phone]   - Dr. Raymund: [phone]  In the event of inclement weather, please call our main line at [phone] for an update on the status of any delays or closures.  Dermatology Medication Tips: Please keep the boxes that topical medications come in in order to help keep track of the instructions about where and how to use these. Pharmacies typically print the medication instructions only on the boxes and not directly on the medication tubes.   If your medication is too expensive, please contact our office at [phone] option 4 or send  us  a message through MyChart.   We are unable to tell what your co-pay for medications will be in advance as this is different depending on your insurance coverage. However, we may be able to find a substitute medication at lower cost or fill out paperwork to get insurance to cover a needed medication.   If a prior authorization is required to get your medication covered by your insurance company, please allow us  1-2 business days to complete this process.  Drug prices often vary depending on where the prescription is filled and  some pharmacies may offer cheaper prices.  The website www.goodrx.com contains coupons for medications through different pharmacies. The prices here do not account for what the cost may be with help from insurance (it may be cheaper with your insurance), but the website can give you the price if you did not use any insurance.  - You can print the associated coupon and take it with your prescription to the pharmacy.  - You may also stop by our office during regular business hours and pick up a GoodRx coupon card.  - If you need your prescription sent electronically to a different pharmacy, notify our office through [facility] or by phone at [phone] option 4.     Si Usted Necesita Algo Despus de Su Visita  Tambin puede enviarnos un mensaje a travs de Clinical cytogeneticist. Por lo general respondemos a los mensajes de MyChart en el transcurso de 1 a 2 das hbiles.  Para renovar recetas, por favor pida a su farmacia que se ponga en contacto con nuestra oficina. Randi lakes de fax es [place] [phone].  Si tiene un asunto urgente cuando la clnica est cerrada y que no puede esperar hasta el siguiente da hbil, puede llamar/localizar a su doctor(a) al nmero que aparece a continuacin.   Por favor, tenga en cuenta que aunque hacemos todo lo posible para estar disponibles para asuntos urgentes fuera del horario de [place], no estamos disponibles las 24 horas del da, los 7 [address]  [address] de la [place].   Si tiene un problema urgente y no puede comunicarse con nosotros, puede optar por buscar atencin mdica  en el consultorio de su doctor(a), en una clnica privada, en un centro de atencin urgente o en una sala de emergencias.  Si tiene Engineer, drilling, por favor llame inmediatamente al 911 o vaya a la sala de emergencias.  Nmeros de bper  - Dr. Hester: [phone]  - Dra. Jackquline: [phone]  - Dr. Claudene: [phone]  - Dra. Kitts: [phone]  En caso de inclemencias del  [place], por favor llame a nuestra lnea principal al [phone] para una actualizacin sobre el estado de cualquier retraso o cierre.  Consejos para la medicacin en dermatologa: Por favor, guarde las cajas en las que vienen los medicamentos de uso tpico para ayudarle a seguir las instrucciones sobre dnde y cmo usarlos. Las farmacias generalmente imprimen las instrucciones del medicamento slo en las cajas y no directamente en los tubos del [place].   Si su medicamento es muy caro, por favor, pngase en contacto con landry rieger llamando al [phone] y presione la opcin 4 o envenos un mensaje a travs de Clinical cytogeneticist.   No podemos decirle cul ser su copago por los medicamentos por adelantado ya que esto es diferente dependiendo de la cobertura de su seguro. Sin embargo, es posible que podamos encontrar un medicamento sustituto a Audiological scientist un formulario para que el seguro cubra el medicamento que se Technical brewer  necesario.   Si se requiere una autorizacin previa para que su compaa de seguros malta su medicamento, por favor permtanos de 1 a 2 das hbiles para completar este proceso.  Los precios de los medicamentos varan con frecuencia dependiendo del Environmental consultant de dnde se surte la receta y alguna farmacias pueden ofrecer precios ms baratos.  El sitio web www.goodrx.com tiene cupones para medicamentos de Health and safety inspector. Los precios aqu no tienen en cuenta lo que podra costar con la ayuda del seguro (puede ser ms barato con su seguro), pero el sitio web puede darle el precio si no utiliz Tourist information centre manager.  - Puede imprimir el cupn correspondiente y llevarlo con su receta a la farmacia.  - Tambin puede pasar por nuestra oficina durante el horario de atencin regular y Education officer, museum una tarjeta de cupones de GoodRx.  - Si necesita que su receta se enve electrnicamente a una farmacia diferente, informe a nuestra oficina a travs de MyChart de  o por telfono  llamando al [phone] y presione la opcin 4.

## 2024-10-28 NOTE — Progress Notes (Signed)
 Subjective   [name] [name] is a 89 y.o. female who presents for the following: Severe stasis dermatitis of bilateral lower extremities w/ retention hyperkeratosis . Patient is established patient .  Today patient reports: LOC at right side of nose, place at right thumb that is painful. Sister states pt fell a few days ago and has pretty bad skin tears at arms.   Sister states pt is not allowed to have OTC products are locked away at the facility so not sure how much OTC Amlactin they have been using to the legs, unsure if they have been using triamcinolone  ointment.   Sister is with patient and contributes to history.   Review of Systems:    No other skin or systemic complaints except as noted in HPI or Assessment and Plan.  The following portions of the chart were reviewed this encounter and updated as appropriate: medications, allergies, medical history  Relevant Medical History:  Personal history of non melanoma skin cancer - see medical history for full details  CHF  Hx of NSTEMI     Objective  (SKPE) Well appearing patient in no apparent distress; mood and affect are within normal limits. Examination was performed of the: bilateral lower extremities  Examination notable for:  2 cm hyperkeratotic plaque at Right 1st MCP. Stasis dermatitis w/ retention hyperkeratosis bilateral lower extremities  Examination limited by: Undergarments, Shoes or socks , and Clothing    Right 1st MCP 2 cm hyperkeratotic plaque    Assessment & Plan  (SKAP)   Severe stasis dermatitis of bilateral lower extremities w/ retention hyperkeratosis  Severe background actinic damage  - chronic, improving from prior visit but not at goal  - Informed the patient that this condition is caused by swelling in his legs and reducing this swelling is the ultimate treatment - Encouraged to continue elevation their legs for 1 hour three times a day - Recommended compression  - Continue triamcinolone  0.1%  ointment daily for any areas that itch  - Recommend continuing OTC Amlactin cream for thick scale at legs.  - Discussed wound care w/ vaseline, non stick bandages for open wounds   Skin tear / abrasion secondary to fall - R arm  - Advised sister to continue bandages as they are in place today.   - continue vaseline or vaseline gauze with bandage   LOC R 1st MCP - SCC vs KA - discussed mohs pending bx   Patient instructions (SKPI)   Procedures, orders, diagnosis for this visit:  NEOPLASM OF SKIN Right 1st MCP - Epidermal / dermal shaving  Lesion diameter (cm):  2 Informed consent: discussed and consent obtained   Timeout: patient name, date of birth, surgical site, and procedure verified   Procedure prep:  Patient was prepped and draped in usual sterile fashion Prep type:  Isopropyl alcohol Anesthesia: the lesion was anesthetized in a standard fashion   Anesthetic:  1% lidocaine  w/ epinephrine  1-100,000 buffered w/ 8.4% NaHCO3 Instrument used: DermaBlade   Hemostasis achieved with: pressure, aluminum chloride and electrodesiccation   Outcome: patient tolerated procedure well   Post-procedure details: wound care instructions given    Specimen 1 - Surgical pathology Differential Diagnosis: SCC vs KA  Check Margins: No 2 cm hyperkeratotic plaque  Neoplasm of skin -     Epidermal / dermal shaving -     Surgical pathology; Standing    Return to clinic: Return for Pending biopsy results, w/ Dr. Raymund.  I, Almetta Nora, RMA, am acting  as scribe for Lauraine JAYSON Kanaris, MD .   Documentation: I have reviewed the above documentation for accuracy and completeness, and I agree with the above.  Lauraine JAYSON Kanaris, MD

## 2024-10-29 LAB — SURGICAL PATHOLOGY

## 2024-11-07 ENCOUNTER — Ambulatory Visit: Payer: Self-pay

## 2024-11-08 NOTE — Telephone Encounter (Signed)
 Discussed ED&C and IL5FU, Ronal states that would work for them and appointment was scheduled.

## 2024-11-08 NOTE — Telephone Encounter (Signed)
-----   Message from Lauraine Kanaris, MD sent at 11/07/2024  6:54 PM EST -----  1. Skin, right 1st mcp :       SQUAMOUS CELL CARCINOMA, KERATOACANTHOMA TYPE, PERIPHERAL AND DEEP MARGINS       INVOLVED    Please notify patient with below plan: Mohs

## 2024-11-08 NOTE — Telephone Encounter (Signed)
-----   Message from Lauraine Kanaris, MD sent at 11/08/2024 11:47 AM EST ----- Could they come in for [facility], Il57fu?  ----- Message ----- From: Willma Rosina LABOR, CMA Sent: 11/08/2024  11:31 AM EST To: Lauraine JAYSON Kanaris, MD  ----- Message from Rosina LABOR Willma, CMA sent at 11/08/2024 11:31 AM EST -----   ----- Message ----- From: Kanaris Lauraine JAYSON, MD Sent: 11/07/2024   6:54 PM EST To: Kanaris Clinical   1. Skin, right 1st mcp :       SQUAMOUS CELL CARCINOMA, KERATOACANTHOMA TYPE, PERIPHERAL AND DEEP MARGINS       INVOLVED    Please notify patient with below plan: Mohs

## 2024-11-08 NOTE — Telephone Encounter (Signed)
 Message states that phone number no longer in service. I contacted patient's sister Ronal Na who is also patient's POA due to patient being in memory care, and discussed pathology results with her. Ronal states that her husband has been diagnosed with pancreatic cancer, she also has custody of a 87 y.o. grandchild, and is elderly herself and wouldn't be able to take patient out of town for treatment. Are there any other recommendations to tx SCC? Please advise.

## 2024-11-12 ENCOUNTER — Emergency Department

## 2024-11-12 ENCOUNTER — Other Ambulatory Visit: Payer: Self-pay

## 2024-11-12 ENCOUNTER — Encounter: Payer: Self-pay | Admitting: Emergency Medicine

## 2024-11-12 ENCOUNTER — Emergency Department
Admission: EM | Admit: 2024-11-12 | Discharge: 2024-11-12 | Disposition: A | Discharge status: Home / Self Care | Source: Home / Self Care | Attending: Emergency Medicine | Admitting: Emergency Medicine

## 2024-11-12 DIAGNOSIS — F0390 Unspecified dementia without behavioral disturbance: Secondary | ICD-10-CM | POA: Diagnosis not present

## 2024-11-12 DIAGNOSIS — I110 Hypertensive heart disease with heart failure: Secondary | ICD-10-CM | POA: Diagnosis not present

## 2024-11-12 DIAGNOSIS — L03115 Cellulitis of right lower limb: Secondary | ICD-10-CM | POA: Insufficient documentation

## 2024-11-12 DIAGNOSIS — I509 Heart failure, unspecified: Secondary | ICD-10-CM | POA: Insufficient documentation

## 2024-11-12 DIAGNOSIS — M79604 Pain in right leg: Secondary | ICD-10-CM | POA: Diagnosis present

## 2024-11-12 DIAGNOSIS — Z85828 Personal history of other malignant neoplasm of skin: Secondary | ICD-10-CM | POA: Diagnosis not present

## 2024-11-12 LAB — CBC WITH DIFFERENTIAL/PLATELET
Abs Immature Granulocytes: 0.04 K/uL (ref 0.00–0.07)
Basophils Absolute: 0 K/uL (ref 0.0–0.1)
Basophils Relative: 0 %
Eosinophils Absolute: 0 K/uL (ref 0.0–0.5)
Eosinophils Relative: 0 %
HCT: 29.6 % — ABNORMAL LOW (ref 36.0–46.0)
Hemoglobin: 9 g/dL — ABNORMAL LOW (ref 12.0–15.0)
Immature Granulocytes: 0 %
Lymphocytes Relative: 6 %
Lymphs Abs: 0.5 K/uL — ABNORMAL LOW (ref 0.7–4.0)
MCH: 31.7 pg (ref 26.0–34.0)
MCHC: 30.4 g/dL (ref 30.0–36.0)
MCV: 104.2 fL — ABNORMAL HIGH (ref 80.0–100.0)
Monocytes Absolute: 0.7 K/uL (ref 0.1–1.0)
Monocytes Relative: 8 %
Neutro Abs: 7.8 K/uL — ABNORMAL HIGH (ref 1.7–7.7)
Neutrophils Relative %: 86 %
Platelets: 199 K/uL (ref 150–400)
RBC: 2.84 MIL/uL — ABNORMAL LOW (ref 3.87–5.11)
RDW: 13.9 % (ref 11.5–15.5)
WBC: 9.2 K/uL (ref 4.0–10.5)
nRBC: 0 % (ref 0.0–0.2)

## 2024-11-12 LAB — COMPREHENSIVE METABOLIC PANEL WITH GFR
ALT: 11 U/L (ref 0–44)
AST: 29 U/L (ref 15–41)
Albumin: 3.3 g/dL — ABNORMAL LOW (ref 3.5–5.0)
Alkaline Phosphatase: 130 U/L — ABNORMAL HIGH (ref 38–126)
Anion gap: 12 (ref 5–15)
BUN: 43 mg/dL — ABNORMAL HIGH (ref 8–23)
CO2: 24 mmol/L (ref 22–32)
Calcium: 8 mg/dL — ABNORMAL LOW (ref 8.9–10.3)
Chloride: 103 mmol/L (ref 98–111)
Creatinine, Ser: 1.45 mg/dL — ABNORMAL HIGH (ref 0.44–1.00)
GFR, Estimated: 35 mL/min — ABNORMAL LOW
Glucose, Bld: 93 mg/dL (ref 70–99)
Potassium: 3.8 mmol/L (ref 3.5–5.1)
Sodium: 139 mmol/L (ref 135–145)
Total Bilirubin: 0.6 mg/dL (ref 0.0–1.2)
Total Protein: 6.2 g/dL — ABNORMAL LOW (ref 6.5–8.1)

## 2024-11-12 LAB — LACTIC ACID, PLASMA: Lactic Acid, Venous: 1.7 mmol/L (ref 0.5–1.9)

## 2024-11-12 MED ORDER — DOXYCYCLINE HYCLATE 100 MG PO TABS: 100.0000 mg | ORAL_TABLET | Freq: Two times a day (BID) | ORAL | 0 refills | Status: AC

## 2024-11-12 MED ORDER — SODIUM CHLORIDE 0.9 % IV SOLN
1.0000 g | Freq: Once | INTRAVENOUS | Status: AC
  Administered 2024-11-12: 1 g via INTRAVENOUS
  Filled 2024-11-12: qty 10

## 2024-11-12 NOTE — Discharge Instructions (Signed)
 Clean the wounds 3 times daily with soap and water Apply dressing to areas that are draining

## 2024-11-12 NOTE — ED Notes (Signed)
Pt changed into new brief.  

## 2024-11-12 NOTE — ED Notes (Signed)
 Ronal Na, pt's sister and POA, called asking for update. This RN provided her w/ update - pt is d/c and will be going home w/ ABX

## 2024-11-12 NOTE — ED Notes (Signed)
 Pt given drink. Refused crackers/apple sauce. No other needs at this time

## 2024-11-12 NOTE — ED Triage Notes (Signed)
 Sent in via EMS from the [place] [address] Per EMS she was sent in for swelling to right leg  Pt was seen for same about 2 weeks ago.  Denies any new falls

## 2024-11-12 NOTE — ED Notes (Signed)
 [facility] called for transport of Pt to [facility] , spoke with Alm

## 2024-11-12 NOTE — ED Notes (Signed)
 1x attempt to call [facility] for report

## 2024-11-12 NOTE — ED Notes (Addendum)
 Changed pt brief, sheets, and gave pt warm blankets. Put pts pants that were soaked with urine in a pt belonging bag. Pt is clean and dry with call bell by bedside.

## 2024-11-12 NOTE — ED Provider Notes (Signed)
 "  [facility] Provider Note    Event Date/Time   First MD Initiated Contact with Patient 11/12/24 1047     (approximate)   History   Leg Swelling   HPI  [name] [name] is a 89 y.o. female history of dementia, hypertension, CHF, A-fib, basal cell carcinoma presents emergency department from the [facility] via EMS.  Patient states she is unsure why she is here.  States she did know she did something they sent her here.  States complaining of right leg pain.  States her pant leg has been swollen.  Has had a couple falls where she hurt her hand and her wrist.  I did call the facility.  The information that I received from the facility was that the home health nurse could not come to see her today so they sent her to the ER via their PA's instructions.  Usually home health cleans and wraps her legs.  I asked further questions as send is patient been more confused over the last few days and they agreed that she had been.  She is still able to use her walker at the facility.      Physical Exam   Triage Vital Signs: ED Triage Vitals  Encounter Vitals Group     BP 11/12/24 1053 (!) 150/75     Girls Systolic BP Percentile --      Girls Diastolic BP Percentile --      Boys Systolic BP Percentile --      Boys Diastolic BP Percentile --      Pulse Rate 11/12/24 1053 75     Resp 11/12/24 1053 17     Temp 11/12/24 1053 97.9 F (36.6 C)     Temp Source 11/12/24 1053 Oral     SpO2 11/12/24 1053 96 %     Weight 11/12/24 1056 105 lb 13.1 oz (48 kg)     Height 11/12/24 1056 5' 5 (1.651 m)     Head Circumference --      Peak Flow --      Pain Score 11/12/24 1056 0     Pain Loc --      Pain Education --      Exclude from Growth Chart --     Most recent vital signs: Vitals:   11/12/24 1053  BP: (!) 150/75  Pulse: 75  Resp: 17  Temp: 97.9 F (36.6 C)  SpO2: 96%     General: Awake, no distress.   CV:  Good peripheral perfusion. regular rate and   rhythm Resp:  Normal effort. Lungs cta Abd:  No distention.   Other:  Lower extremities with scabs, redness and swelling noted to the right leg, tenderness, right hand with dark bruising and tenderness noted fingers feel cooler compared to the left, still has good cap refill.  Patient has a strong odor.  Possible fecal material noted on the left lower leg.   ED Results / Procedures / Treatments   Labs (all labs ordered are listed, but only abnormal results are displayed) Labs Reviewed  CBC WITH DIFFERENTIAL/PLATELET - Abnormal; Notable for the following components:      Result Value   RBC 2.84 (*)    Hemoglobin 9.0 (*)    HCT 29.6 (*)    MCV 104.2 (*)    Neutro Abs 7.8 (*)    Lymphs Abs 0.5 (*)    All other components within normal limits  COMPREHENSIVE METABOLIC PANEL WITH GFR - Abnormal;  Notable for the following components:   BUN 43 (*)    Creatinine, Ser 1.45 (*)    Calcium  8.0 (*)    Total Protein 6.2 (*)    Albumin 3.3 (*)    Alkaline Phosphatase 130 (*)    GFR, Estimated 35 (*)    All other components within normal limits  CULTURE, BLOOD (ROUTINE X 2)  CULTURE, BLOOD (ROUTINE X 2)  LACTIC ACID, PLASMA  URINALYSIS, ROUTINE W REFLEX MICROSCOPIC     EKG     RADIOLOGY X-ray of the right wrist, right hand, right lower extremity, ultrasound right lower extremity    PROCEDURES:   Procedures  Critical Care:  no Chief Complaint  Patient presents with   Leg Swelling      MEDICATIONS ORDERED IN ED: Medications  cefTRIAXone  (ROCEPHIN ) 1 g in sodium chloride  0.9 % 100 mL IVPB (1 g Intravenous New Bag/Given 11/12/24 1353)     IMPRESSION / MDM / ASSESSMENT AND PLAN / ED COURSE  I reviewed the triage vital signs and the nursing notes.                              Differential diagnosis includes, but is not limited to, sepsis, cellulitis, fracture, contusion, osteomyelitis  Patient's presentation is most consistent with acute presentation with potential  threat to life or bodily function.   Medications given: Rocephin  1 g IV  Labs and imaging ordered, called the [facility] to discuss patient's symptoms  Clinical Course as of 11/12/24 1410  Fri Nov 12, 2024  1223 DG Wrist Complete Right [SF]    Clinical Course User Index [SF] Gasper Devere ORN, PA-C   X-ray of the right wrist, right hand, and right tib-fib, independent review and interpretation by me as being negative for fracture, confirmed by radiology  Ultrasound right lower extremity for DVT, independent review and interpretation radiologist report as being negative for DVT.  Since the patient has similar cellulitis and swelling of the right lower extremity will give her Rocephin  1 g IV here in the ED.  Will then start her on doxycycline  outpatient.  Wound care instructions been sent to the facility.  Return emergency department if worsening.  All results and care also discussed with Dr. Jossie.  He was also in to see the patient.  She is in stable condition and discharged  FINAL CLINICAL IMPRESSION(S) / ED DIAGNOSES   Final diagnoses:  Cellulitis of right lower leg     Rx / DC Orders   ED Discharge Orders          Ordered    doxycycline  (VIBRA -TABS) 100 MG tablet  2 times daily        11/12/24 1356             Note:  This document was prepared using Dragon voice recognition software and may include unintentional dictation errors.    Gasper Devere ORN, PA-C 11/12/24 1410    Jossie Artist POUR, MD 11/13/24 [phone]  "

## 2024-11-17 LAB — CULTURE, BLOOD (ROUTINE X 2)
Culture: NO GROWTH
Culture: NO GROWTH

## 2024-11-24 ENCOUNTER — Ambulatory Visit (INDEPENDENT_AMBULATORY_CARE_PROVIDER_SITE_OTHER)

## 2024-11-24 DIAGNOSIS — C44622 Squamous cell carcinoma of skin of right upper limb, including shoulder: Secondary | ICD-10-CM | POA: Diagnosis not present

## 2024-11-24 MED ORDER — FLUOROURACIL CHEMO INJECTION 500 MG/10ML FOR DERMATOLOGY USE
15.0000 mg | Freq: Once | INTRAVENOUS | Status: AC
  Administered 2024-11-24: 10 mg via INTRALESIONAL

## 2024-11-24 NOTE — Progress Notes (Signed)
" °  °  Subjective   [name] [name] is a 89 y.o. female who presents for the following: treatment of SCC . Patient is established patient   Today patient reports: No complications since biopsy   Review of Systems:    No other skin or systemic complaints except as noted in HPI or Assessment and Plan.  The following portions of the chart were reviewed this encounter and updated as appropriate: medications, allergies, medical history  Relevant Medical History:  Personal history of non melanoma skin cancer - see medical history for full details   Objective  (SKPE) Well appearing patient in no apparent distress; mood and affect are within normal limits. Examination was performed of the: Focused Exam of: right 1st mcp   Examination notable for: well healed biopsy site     Right 1st MCP Well healed biopsy site   Assessment & Plan  (SKAP)    Procedures, orders, diagnosis for this visit:  SQUAMOUS CELL CARCINOMA OF RIGHT HAND Right 1st MCP - Destruction of lesion Complexity: simple   Destruction method: electrodesiccation and curettage   Informed consent: discussed and consent obtained   Timeout:  patient name, date of birth, surgical site, and procedure verified Procedure prep:  Patient was prepped and draped in usual sterile fashion Prep type:  Isopropyl alcohol Anesthesia: the lesion was anesthetized in a standard fashion   Anesthetic:  1% lidocaine  w/ epinephrine  1-100,000 local infiltration Curettage performed in three different directions: Yes   Electrodesiccation performed over the curetted area: Yes   Curettage cycles:  3 Final wound size (cm):  1.5 Hemostasis achieved with:  aluminum chloride and electrodesiccation Outcome: patient tolerated procedure well with no complications   Post-procedure details: sterile dressing applied and wound care instructions given   Dressing type: bandage and petrolatum    - Intralesional injection Location: right 1st mcp  Informed  Consent: Discussed risks (infection, pain, bleeding, bruising, thinning of the skin, loss of skin pigment, lack of resolution, and recurrence of lesion) and benefits of the procedure, as well as the alternatives. Informed consent was obtained. Preparation: The area was prepared a standard fashion.  Procedure Details: An intralesional injection was performed with 5-fluorouracil . 0.2 cc in total were injected.  Total number of injections: 1  Plan: The patient was instructed on post-op care. Recommend OTC analgesia as needed for pain.   - fluorouracil  (ADRUCIL ) chemo injection 15 mg  Squamous cell carcinoma of right hand -     Destruction of lesion -     Intralesional injection -     fluorouracil     Return to clinic: Return if symptoms worsen or fail to improve.  I, Emerick Ege, CMA am acting as scribe for Lauraine JAYSON Kanaris, MD.   Documentation: I have reviewed the above documentation for accuracy and completeness, and I agree with the above.  Lauraine JAYSON Kanaris, MD  "

## 2024-11-24 NOTE — Patient Instructions (Signed)

## 2024-12-16 ENCOUNTER — Telehealth: Payer: Self-pay

## 2024-12-16 NOTE — Telephone Encounter (Signed)
 Ronal Na , patient power attorney and sister, Called and requested patient's pathology report for facility patient is staying in order to get treatment for area. Made copy of results and placed at front desk for her to pick up.
